# Patient Record
Sex: Male | Born: 1950 | Race: White | Hispanic: No | Marital: Married | Smoking: Never smoker
Health system: Southern US, Community
[De-identification: ages and names within clinical notes are randomized; demographics above are authoritative.]

## PROBLEM LIST (undated history)

## (undated) DIAGNOSIS — F0150 Vascular dementia without behavioral disturbance: Secondary | ICD-10-CM

## (undated) DIAGNOSIS — I4891 Unspecified atrial fibrillation: Secondary | ICD-10-CM

## (undated) DIAGNOSIS — F419 Anxiety disorder, unspecified: Secondary | ICD-10-CM

## (undated) DIAGNOSIS — G250 Essential tremor: Secondary | ICD-10-CM

## (undated) DIAGNOSIS — I10 Essential (primary) hypertension: Secondary | ICD-10-CM

## (undated) DIAGNOSIS — F32A Depression, unspecified: Secondary | ICD-10-CM

## (undated) DIAGNOSIS — K219 Gastro-esophageal reflux disease without esophagitis: Secondary | ICD-10-CM

## (undated) DIAGNOSIS — I2510 Atherosclerotic heart disease of native coronary artery without angina pectoris: Secondary | ICD-10-CM

## (undated) DIAGNOSIS — I219 Acute myocardial infarction, unspecified: Secondary | ICD-10-CM

## (undated) DIAGNOSIS — I951 Orthostatic hypotension: Secondary | ICD-10-CM

## (undated) DIAGNOSIS — E7800 Pure hypercholesterolemia, unspecified: Secondary | ICD-10-CM

## (undated) DIAGNOSIS — I639 Cerebral infarction, unspecified: Secondary | ICD-10-CM

## (undated) DIAGNOSIS — I495 Sick sinus syndrome: Secondary | ICD-10-CM

## (undated) HISTORY — PX: HERNIA REPAIR: SHX51

## (undated) HISTORY — PX: APPENDECTOMY: SHX54

## (undated) HISTORY — PX: SHOULDER SURGERY: SHX246

## (undated) HISTORY — PX: CARDIAC SURGERY: SHX584

## (undated) HISTORY — PX: NASAL SINUS SURGERY: SHX719

## (undated) HISTORY — PX: CHOLECYSTECTOMY: SHX55

---

## 2001-09-30 HISTORY — PX: ESOPHAGOGASTRODUODENOSCOPY: SHX1529

## 2011-05-03 HISTORY — PX: SHOULDER SURGERY: SHX246

## 2013-02-12 DIAGNOSIS — Z955 Presence of coronary angioplasty implant and graft: Secondary | ICD-10-CM | POA: Insufficient documentation

## 2013-02-12 DIAGNOSIS — E785 Hyperlipidemia, unspecified: Secondary | ICD-10-CM | POA: Insufficient documentation

## 2013-02-12 DIAGNOSIS — I10 Essential (primary) hypertension: Secondary | ICD-10-CM | POA: Insufficient documentation

## 2013-02-12 DIAGNOSIS — N529 Male erectile dysfunction, unspecified: Secondary | ICD-10-CM | POA: Insufficient documentation

## 2015-05-03 DIAGNOSIS — D649 Anemia, unspecified: Secondary | ICD-10-CM

## 2015-05-03 HISTORY — DX: Anemia, unspecified: D64.9

## 2015-08-01 HISTORY — PX: ESOPHAGOGASTRODUODENOSCOPY: SHX1529

## 2015-08-01 HISTORY — PX: COLONOSCOPY: SHX5424

## 2015-08-10 LAB — HM COLONOSCOPY

## 2016-12-18 DIAGNOSIS — R001 Bradycardia, unspecified: Secondary | ICD-10-CM | POA: Insufficient documentation

## 2016-12-18 DIAGNOSIS — Z9889 Other specified postprocedural states: Secondary | ICD-10-CM | POA: Insufficient documentation

## 2017-02-17 DIAGNOSIS — Z950 Presence of cardiac pacemaker: Secondary | ICD-10-CM | POA: Insufficient documentation

## 2017-05-25 DIAGNOSIS — Z950 Presence of cardiac pacemaker: Secondary | ICD-10-CM | POA: Insufficient documentation

## 2017-09-14 DIAGNOSIS — N183 Chronic kidney disease, stage 3 unspecified: Secondary | ICD-10-CM | POA: Insufficient documentation

## 2017-09-26 DIAGNOSIS — G4700 Insomnia, unspecified: Secondary | ICD-10-CM | POA: Insufficient documentation

## 2017-12-13 DIAGNOSIS — K219 Gastro-esophageal reflux disease without esophagitis: Secondary | ICD-10-CM | POA: Insufficient documentation

## 2018-06-01 DIAGNOSIS — W19XXXA Unspecified fall, initial encounter: Secondary | ICD-10-CM | POA: Insufficient documentation

## 2018-06-01 DIAGNOSIS — F32A Depression, unspecified: Secondary | ICD-10-CM | POA: Insufficient documentation

## 2018-10-01 DIAGNOSIS — I951 Orthostatic hypotension: Secondary | ICD-10-CM | POA: Insufficient documentation

## 2019-01-01 HISTORY — PX: HERNIA REPAIR: SHX51

## 2019-01-01 HISTORY — PX: CHOLECYSTECTOMY, LAPAROSCOPIC: SHX56

## 2019-03-03 HISTORY — PX: LEFT ATRIAL APPENDAGE OCCLUSION: SHX173A

## 2020-01-01 HISTORY — PX: ASD REPAIR: SHX258

## 2020-03-02 DIAGNOSIS — I69391 Dysphagia following cerebral infarction: Secondary | ICD-10-CM

## 2020-03-02 HISTORY — DX: Dysphagia following cerebral infarction: I69.391

## 2020-05-26 DIAGNOSIS — R748 Abnormal levels of other serum enzymes: Secondary | ICD-10-CM | POA: Insufficient documentation

## 2020-09-30 DIAGNOSIS — G2581 Restless legs syndrome: Secondary | ICD-10-CM | POA: Insufficient documentation

## 2020-09-30 DIAGNOSIS — D649 Anemia, unspecified: Secondary | ICD-10-CM | POA: Insufficient documentation

## 2020-11-09 ENCOUNTER — Emergency Department (HOSPITAL_BASED_OUTPATIENT_CLINIC_OR_DEPARTMENT_OTHER)
Admission: EM | Admit: 2020-11-09 | Discharge: 2020-11-09 | Disposition: A | Discharge status: Home / Self Care | Payer: Medicare Other | Attending: Emergency Medicine | Admitting: Emergency Medicine

## 2020-11-09 ENCOUNTER — Emergency Department (HOSPITAL_BASED_OUTPATIENT_CLINIC_OR_DEPARTMENT_OTHER): Payer: Medicare Other

## 2020-11-09 ENCOUNTER — Encounter (HOSPITAL_BASED_OUTPATIENT_CLINIC_OR_DEPARTMENT_OTHER): Payer: Self-pay

## 2020-11-09 ENCOUNTER — Other Ambulatory Visit: Payer: Self-pay

## 2020-11-09 DIAGNOSIS — W19XXXA Unspecified fall, initial encounter: Secondary | ICD-10-CM

## 2020-11-09 DIAGNOSIS — S0083XA Contusion of other part of head, initial encounter: Secondary | ICD-10-CM | POA: Insufficient documentation

## 2020-11-09 DIAGNOSIS — I2510 Atherosclerotic heart disease of native coronary artery without angina pectoris: Secondary | ICD-10-CM | POA: Diagnosis not present

## 2020-11-09 DIAGNOSIS — F0390 Unspecified dementia without behavioral disturbance: Secondary | ICD-10-CM | POA: Insufficient documentation

## 2020-11-09 DIAGNOSIS — S0990XA Unspecified injury of head, initial encounter: Secondary | ICD-10-CM

## 2020-11-09 DIAGNOSIS — W010XXA Fall on same level from slipping, tripping and stumbling without subsequent striking against object, initial encounter: Secondary | ICD-10-CM | POA: Diagnosis not present

## 2020-11-09 DIAGNOSIS — T148XXA Other injury of unspecified body region, initial encounter: Secondary | ICD-10-CM

## 2020-11-09 DIAGNOSIS — I119 Hypertensive heart disease without heart failure: Secondary | ICD-10-CM | POA: Diagnosis not present

## 2020-11-09 HISTORY — DX: Vascular dementia, unspecified severity, without behavioral disturbance, psychotic disturbance, mood disturbance, and anxiety: F01.50

## 2020-11-09 HISTORY — DX: Anxiety disorder, unspecified: F41.9

## 2020-11-09 HISTORY — DX: Acute myocardial infarction, unspecified: I21.9

## 2020-11-09 HISTORY — DX: Atherosclerotic heart disease of native coronary artery without angina pectoris: I25.10

## 2020-11-09 HISTORY — DX: Orthostatic hypotension: I95.1

## 2020-11-09 HISTORY — DX: Gastro-esophageal reflux disease without esophagitis: K21.9

## 2020-11-09 HISTORY — DX: Essential (primary) hypertension: I10

## 2020-11-09 HISTORY — DX: Cerebral infarction, unspecified: I63.9

## 2020-11-09 HISTORY — DX: Pure hypercholesterolemia, unspecified: E78.00

## 2020-11-09 HISTORY — DX: Depression, unspecified: F32.A

## 2020-11-09 HISTORY — DX: Essential tremor: G25.0

## 2020-11-09 IMAGING — CT CT HEAD W/O CM
4 series · 15 of 47 positions shown, 17 images · non-contrast
Comparison: None.

CLINICAL DATA: Fall

EXAM:
CT HEAD WITHOUT CONTRAST
TECHNIQUE: Contiguous axial images were obtained from the base of the skull
through the vertex without intravenous contrast.

[Series 2: head wo · axial · 0.51mm/px · z∈[+1090,+1230]mm · 7 of 38 slices shown, 9 images]
[im 5/38  brain]
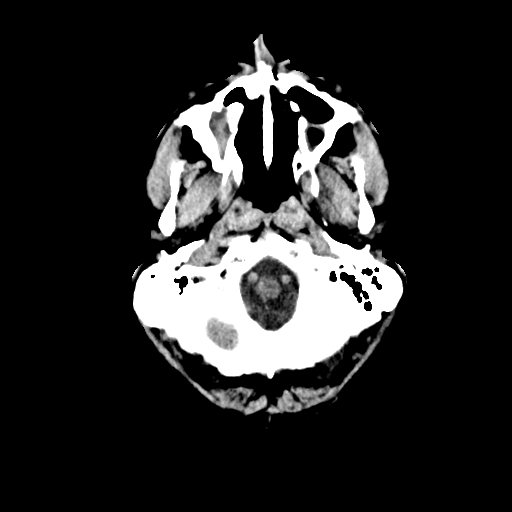
[im 5/38  bone]
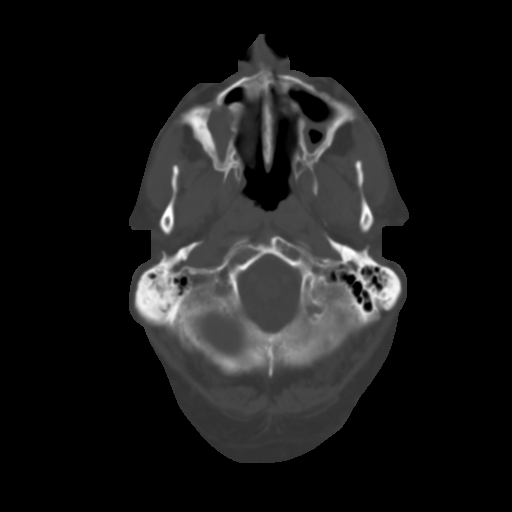
[im 10/38  brain]
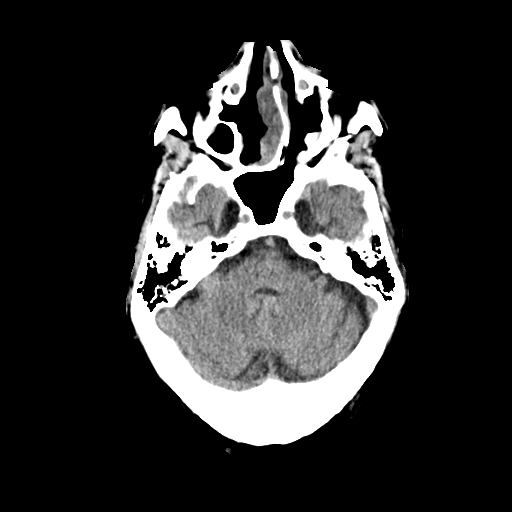
[im 14/38  brain]
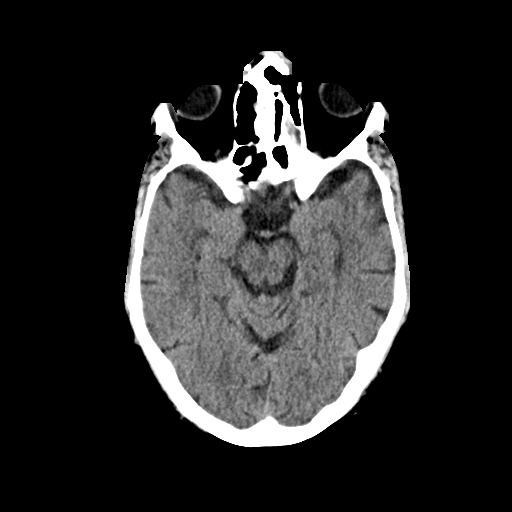
[im 19/38  brain]
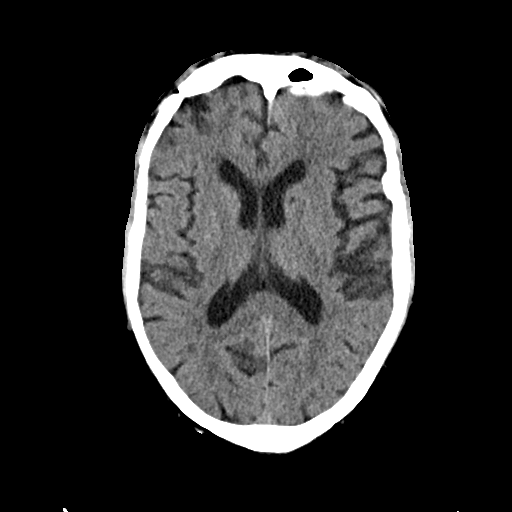
[im 24/38  brain]
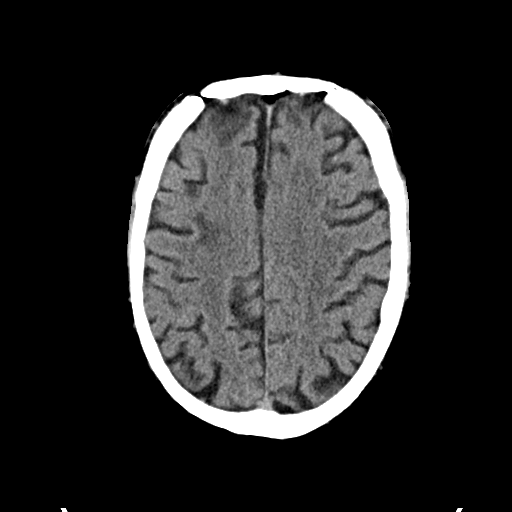
[im 24/38  bone]
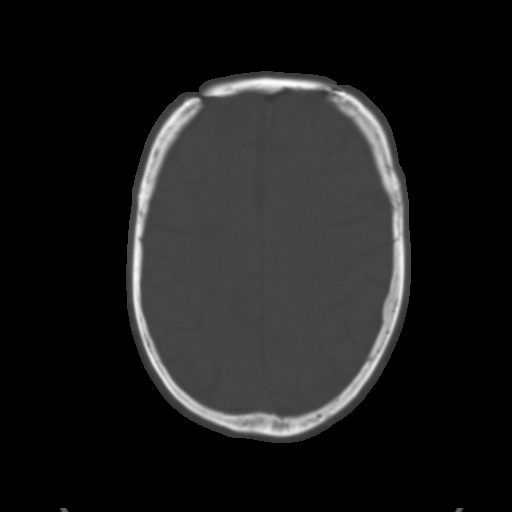
[im 28/38  brain]
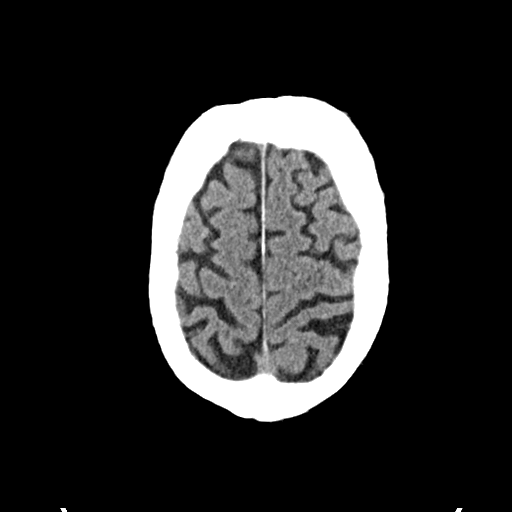
[im 33/38  brain]
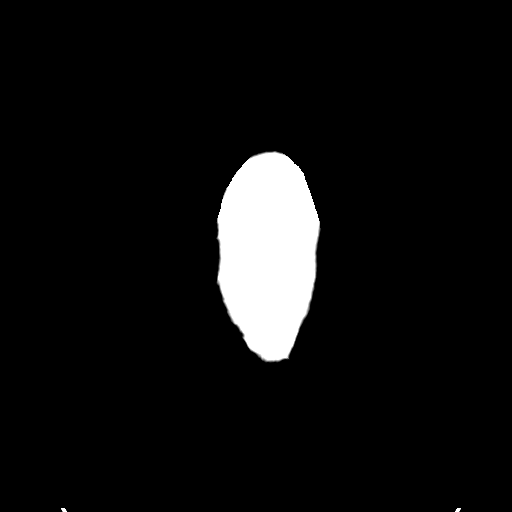

[Series 3: head bone · axial · 0.51mm/px · z∈[+1088,+1106]mm · 2 of 93 slices shown]
[im 10/93  bone]
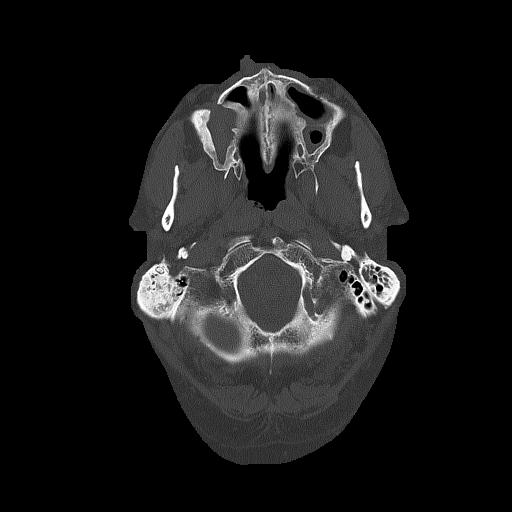
[im 19/93  bone]
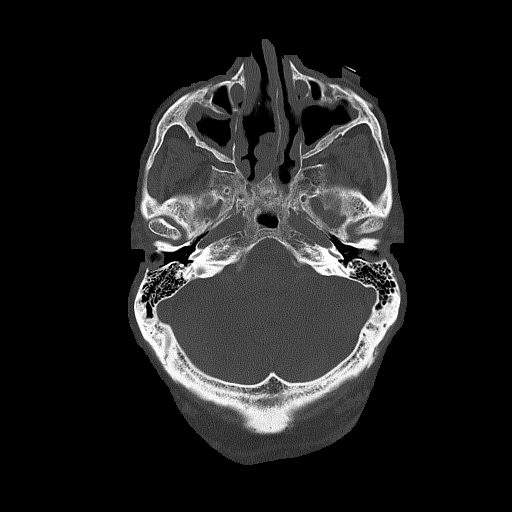

[Series 4: coronal soft · coronal · 0.36mm/px · 3 of 81 slices shown]
[im 27/81  brain]
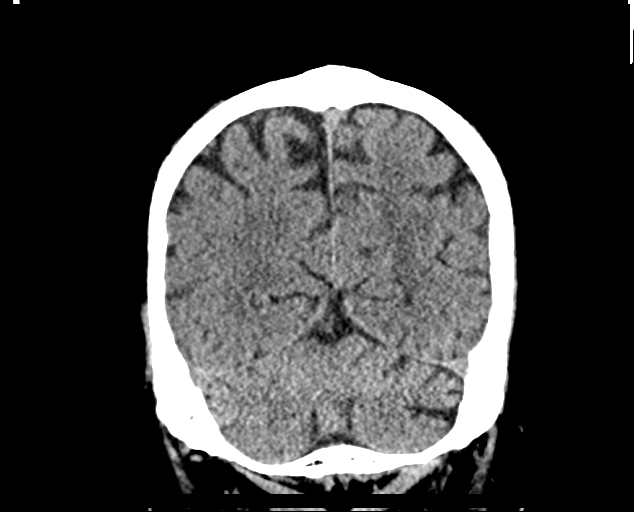
[im 36/81  brain]
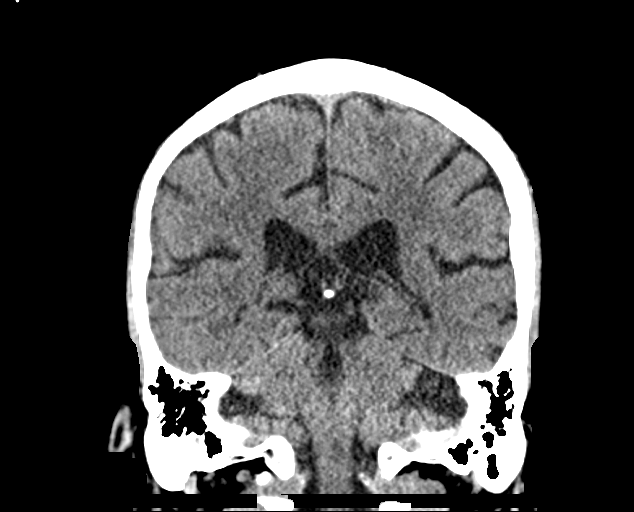
[im 45/81  brain]
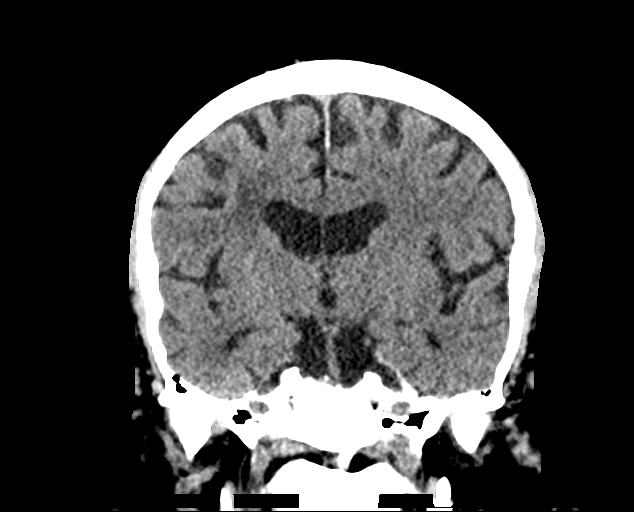

[Series 5: sag soft · sagittal · 0.36mm/px · 3 of 67 slices shown]
[im 23/67  brain]
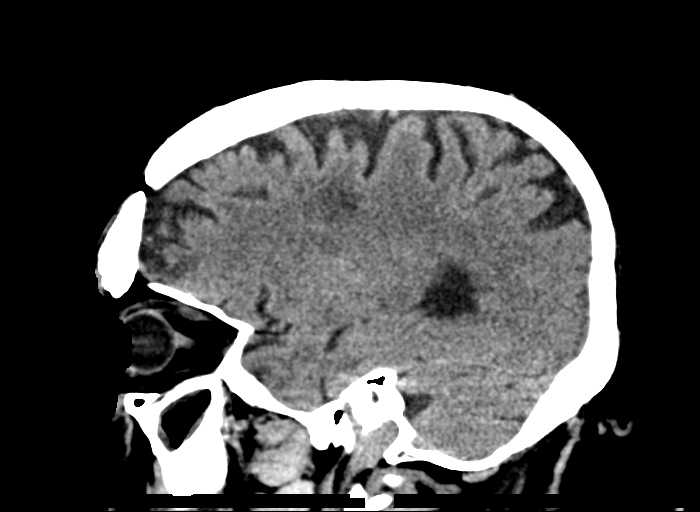
[im 34/67  brain]
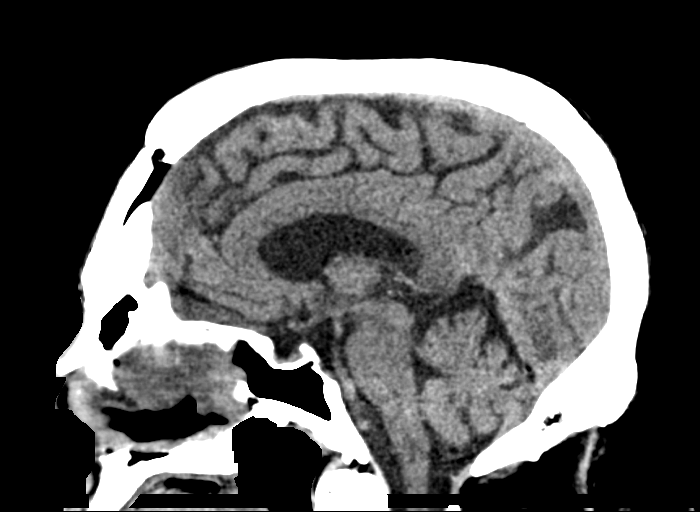
[im 45/67  brain]
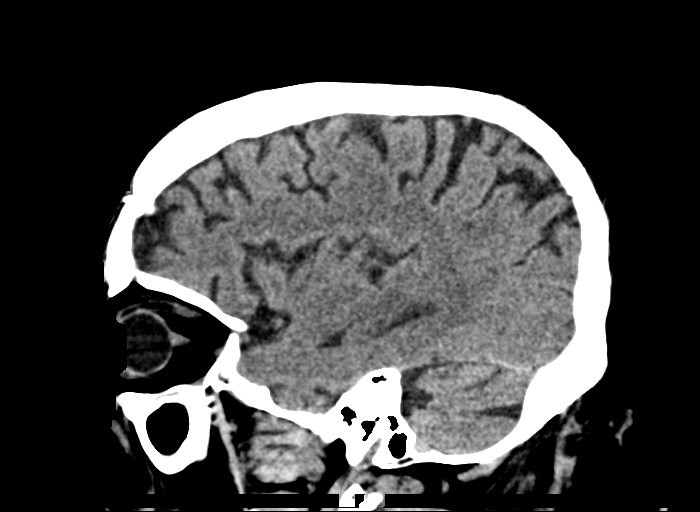

[15 of 47 positions shown; findings below may reference images not displayed]

FINDINGS: Brain: Evidence of prior bifrontal craniotomy with areas of
encephalomalacia in the anterior frontal lobes. Additional focal
region of subcortical white matter hypoattenuation seen in the right
corona radiata, possibly remote sequela of trauma, ischemia or
microvascular angiopathy. More diffuse patchy areas of white matter
hypoattenuation elsewhere likely reflecting chronic microvascular
ischemic changes as well. Few remote appearing lacunar infarcts in
the left caudate and bilateral basal ganglia. No convincing CT
evidence of acute infarction, hemorrhage, hydrocephalus, extra-axial
collection, visible mass lesion or mass effect. Symmetric prominence
of the ventricles, cisterns and sulci compatible with parenchymal
volume loss.

Vascular: Atherosclerotic calcification of the carotid siphons and
intradural vertebral arteries. No hyperdense vessel.

Skull: Postsurgical changes from prior bifrontal craniotomy. No
acute complication is seen. No acute fracture, scalp swelling or
hematoma.

Sinuses/Orbits: Evidence of prior sinus surgery including
ethmoidectomies, turbinectomies and anterior right maxillary
fenestration. Diffuse nodular mural thickening throughout the
paranasal sinuses with hyperostotic features suggesting chronicity.
Small bilateral mastoid effusions. Middle ear cavities are clear.
Included orbital structures are unremarkable.

Other: None.
IMPRESSION: 1. No convincing CT evidence of an acute intracranial process. No
scalp swelling or calvarial fracture.
2. Evidence of prior bifrontal craniotomy with subjacent areas of
encephalomalacia.
3. Background of diffuse chronic microvascular
angiopathy/microvascular ischemic change. Suspect more focal regions
in the right corona radiata with remote appearing lacunar type
infarcts in the bilateral basal ganglia and left caudate.
4. Intracranial atherosclerosis.
5. Mild parenchymal volume loss.
6. Prior sinus surgery with likely chronic persistent mural sinus
disease. Bilateral mastoid effusions as well.

## 2020-11-09 MED ORDER — ACETAMINOPHEN 500 MG PO TABS
1000.0000 mg | ORAL_TABLET | Freq: Once | ORAL | Status: AC
  Administered 2020-11-09: 1000 mg via ORAL
  Filled 2020-11-09: qty 2

## 2020-11-09 NOTE — ED Triage Notes (Signed)
Pt tripped while using a walker and hit his head. Unknown LOC. Pt does take ASA and Plavix. Pt A/Ox4 and move all 4 extremities during triage.

## 2020-11-10 NOTE — ED Provider Notes (Signed)
[place] EMERGENCY DEPARTMENT Provider Note   CSN: [id number] Arrival date & time: 11/09/20  2153     History Chief Complaint  Patient presents with   Mark Thomas is a 70 y.o. male.   Fall This is a new problem. The current episode started less than 1 hour ago. The problem occurs constantly. The problem has not changed since onset.Associated symptoms include headaches. Pertinent negatives include no chest pain, no abdominal pain and no shortness of breath. Nothing aggravates the symptoms. Nothing relieves the symptoms. He has tried nothing for the symptoms.      Past Medical History:  Diagnosis Date   Anxiety    Coronary artery disease    Depression    Essential tremor    GERD (gastroesophageal reflux disease)    High cholesterol    Hypertension    Myocardial infarct (HCC)    Orthostatic hypotension    Stroke [facility])    Vascular dementia ([place])     There are no problems to display for this patient.   Past Surgical History:  Procedure Laterality Date   APPENDECTOMY     CARDIAC SURGERY     CHOLECYSTECTOMY     HERNIA REPAIR     NASAL SINUS SURGERY     SHOULDER SURGERY         No family history on file.  Social History   Tobacco Use   Smoking status: Never    Passive exposure: Never   Smokeless tobacco: Never  Vaping Use   Vaping Use: Never used  Substance Use Topics   Alcohol use: Not Currently   Drug use: Never    Home Medications Prior to Admission medications   Not on File    Allergies    Dilaudid [hydromorphone], Codeine, Penicillins, and Phenobarbital  Review of Systems   Review of Systems  Respiratory:  Negative for shortness of breath.   Cardiovascular:  Negative for chest pain.  Gastrointestinal:  Negative for abdominal pain.  Neurological:  Positive for headaches.  All other systems reviewed and are negative.  Physical Exam Updated Vital Signs BP 131/83   Pulse 76   Temp 98.8 F (37.1 C) (Oral)   Resp  17   Ht _0  (1.854 m)   Wt 104.3 kg   SpO2 97%   BMI 30.34 kg/m   Physical Exam Vitals and nursing note reviewed.  Constitutional:      Appearance: He is well-developed.  HENT:     Head: Normocephalic.     Comments: Abrasion and hematoma to left frontal bone area    Nose: No congestion or rhinorrhea.  Eyes:     Pupils: Pupils are equal, round, and reactive to light.  Cardiovascular:     Rate and Rhythm: Normal rate.  Pulmonary:     Effort: Pulmonary effort is normal. No respiratory distress.  Abdominal:     General: Abdomen is flat. There is no distension.  Musculoskeletal:        General: Normal range of motion.     Cervical back: Normal range of motion.  Skin:    General: Skin is warm and dry.  Neurological:     General: No focal deficit present.     Mental Status: He is alert.    ED Results / Procedures / Treatments   Labs (all labs ordered are listed, but only abnormal results are displayed) Labs Reviewed - No data to display  EKG None  Radiology CT Head Wo Contrast  Result Date: 11/09/2020 CLINICAL DATA:  Fall EXAM: CT HEAD WITHOUT CONTRAST TECHNIQUE: Contiguous axial images were obtained from the base of the skull through the vertex without intravenous contrast. COMPARISON:  None. FINDINGS: Brain: Evidence of prior bifrontal craniotomy with areas of encephalomalacia in the anterior frontal lobes. Additional focal region of subcortical white matter hypoattenuation seen in the right corona radiata, possibly remote sequela of trauma, ischemia or microvascular angiopathy. More diffuse patchy areas of white matter hypoattenuation elsewhere likely reflecting chronic microvascular ischemic changes as well. Few remote appearing lacunar infarcts in the left caudate and bilateral basal ganglia. No convincing CT evidence of acute infarction, hemorrhage, hydrocephalus, extra-axial collection, visible mass lesion or mass effect. Symmetric prominence of the ventricles, cisterns  and sulci compatible with parenchymal volume loss. Vascular: Atherosclerotic calcification of the carotid siphons and intradural vertebral arteries. No hyperdense vessel. Skull: Postsurgical changes from prior bifrontal craniotomy. No acute complication is seen. No acute fracture, scalp swelling or hematoma. Sinuses/Orbits: Evidence of prior sinus surgery including ethmoidectomies, turbinectomies and anterior right maxillary fenestration. Diffuse nodular mural thickening throughout the paranasal sinuses with hyperostotic features suggesting chronicity. Small bilateral mastoid effusions. Middle ear cavities are clear. Included orbital structures are unremarkable. Other: None. IMPRESSION: 1. No convincing CT evidence of an acute intracranial process. No scalp swelling or calvarial fracture. 2. Evidence of prior bifrontal craniotomy with subjacent areas of encephalomalacia. 3. Background of diffuse chronic microvascular angiopathy/microvascular ischemic change. Suspect more focal regions in the right corona radiata with remote appearing lacunar type infarcts in the bilateral basal ganglia and left caudate. 4. Intracranial atherosclerosis. 5. Mild parenchymal volume loss. 6. 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 Bilateral mastoid effusions as well. Electronically Signed   By: Lovena Le M.D.   On: 11/09/2020 23:04    Procedures Procedures   Medications Ordered in ED Medications  acetaminophen (TYLENOL) tablet 1,000 mg (1,000 mg Oral Given 11/09/20 2327)    ED Course  I have reviewed the triage vital signs and the nursing notes.  Pertinent labs & imaging results that were available during my care of the patient were reviewed by me and considered in my medical decision making (see chart for details).    MDM Rules/Calculators/A&P                          Patient with multi medical problems documented above including multiple strokes and vascular dementia presents  emerged from today secondary to what sounds like an obvious mechanical fall.  Patient states he is walking his walker it hit a piece of furniture and he fell down and hit his head on the ground.  He has a headache since that time.  He states that he had to drag his self across the carpet and has little bit of rash/abrasion on his forehead from the same.  His wife helped him get up into a chair and was able to do that without difficulty.  He was able to ambulate to a vehicle with his walker without difficulty.  Low suspicion for any type of hip or long bone fracture.  No evidence of abnormalities on CT scan.  Patient stable for discharge at this time.  Final Clinical Impression(s) / ED Diagnoses Final diagnoses:  Fall, initial encounter  Abrasion  Injury of head, initial encounter    Rx / DC Orders ED Discharge Orders     None        Noralee Dutko, Corene Cornea, MD 11/10/20  [place]

## 2020-11-30 ENCOUNTER — Telehealth (HOSPITAL_COMMUNITY): Payer: Self-pay | Admitting: Internal Medicine

## 2020-11-30 DIAGNOSIS — R269 Unspecified abnormalities of gait and mobility: Secondary | ICD-10-CM

## 2020-11-30 HISTORY — DX: Unspecified abnormalities of gait and mobility: R26.9

## 2020-11-30 NOTE — Telephone Encounter (Signed)
Pt called to f/u w/referral from  Dr Manuella Ghazi, [facility] , please advise

## 2020-12-06 ENCOUNTER — Emergency Department (HOSPITAL_BASED_OUTPATIENT_CLINIC_OR_DEPARTMENT_OTHER): Payer: Medicare Other

## 2020-12-06 ENCOUNTER — Other Ambulatory Visit: Payer: Self-pay

## 2020-12-06 ENCOUNTER — Emergency Department (HOSPITAL_BASED_OUTPATIENT_CLINIC_OR_DEPARTMENT_OTHER)
Admission: EM | Admit: 2020-12-06 | Discharge: 2020-12-07 | Discharge status: Against Medical Advice | Payer: Medicare Other | Attending: Emergency Medicine | Admitting: Emergency Medicine

## 2020-12-06 ENCOUNTER — Encounter (HOSPITAL_BASED_OUTPATIENT_CLINIC_OR_DEPARTMENT_OTHER): Payer: Self-pay

## 2020-12-06 DIAGNOSIS — R011 Cardiac murmur, unspecified: Secondary | ICD-10-CM | POA: Insufficient documentation

## 2020-12-06 DIAGNOSIS — Z8673 Personal history of transient ischemic attack (TIA), and cerebral infarction without residual deficits: Secondary | ICD-10-CM | POA: Insufficient documentation

## 2020-12-06 DIAGNOSIS — I10 Essential (primary) hypertension: Secondary | ICD-10-CM | POA: Insufficient documentation

## 2020-12-06 DIAGNOSIS — R0602 Shortness of breath: Secondary | ICD-10-CM | POA: Insufficient documentation

## 2020-12-06 DIAGNOSIS — I2510 Atherosclerotic heart disease of native coronary artery without angina pectoris: Secondary | ICD-10-CM | POA: Insufficient documentation

## 2020-12-06 DIAGNOSIS — F0150 Vascular dementia without behavioral disturbance: Secondary | ICD-10-CM | POA: Diagnosis not present

## 2020-12-06 DIAGNOSIS — R079 Chest pain, unspecified: Secondary | ICD-10-CM

## 2020-12-06 DIAGNOSIS — R0789 Other chest pain: Secondary | ICD-10-CM | POA: Insufficient documentation

## 2020-12-06 DIAGNOSIS — Z20822 Contact with and (suspected) exposure to covid-19: Secondary | ICD-10-CM | POA: Insufficient documentation

## 2020-12-06 DIAGNOSIS — R0601 Orthopnea: Secondary | ICD-10-CM | POA: Diagnosis not present

## 2020-12-06 LAB — CBC
HCT: 38.6 % — ABNORMAL LOW (ref 39.0–52.0)
Hemoglobin: 12.6 g/dL — ABNORMAL LOW (ref 13.0–17.0)
MCH: 31 pg (ref 26.0–34.0)
MCHC: 32.6 g/dL (ref 30.0–36.0)
MCV: 95.1 fL (ref 80.0–100.0)
Platelets: 194 10*3/uL (ref 150–400)
RBC: 4.06 MIL/uL — ABNORMAL LOW (ref 4.22–5.81)
RDW: 16.9 % — ABNORMAL HIGH (ref 11.5–15.5)
WBC: 7.4 10*3/uL (ref 4.0–10.5)
nRBC: 0 % (ref 0.0–0.2)

## 2020-12-06 LAB — BASIC METABOLIC PANEL
Anion gap: 10 (ref 5–15)
BUN: 28 mg/dL — ABNORMAL HIGH (ref 8–23)
CO2: 25 mmol/L (ref 22–32)
Calcium: 8.7 mg/dL — ABNORMAL LOW (ref 8.9–10.3)
Chloride: 100 mmol/L (ref 98–111)
Creatinine, Ser: 1.66 mg/dL — ABNORMAL HIGH (ref 0.61–1.24)
GFR, Estimated: 45 mL/min — ABNORMAL LOW (ref >60)
Glucose, Bld: 97 mg/dL (ref 70–99)
Potassium: 4.5 mmol/L (ref 3.5–5.1)
Sodium: 135 mmol/L (ref 135–145)

## 2020-12-06 LAB — TROPONIN I (HIGH SENSITIVITY): Troponin I (High Sensitivity): 10 ng/L (ref <18)

## 2020-12-06 IMAGING — DX DG CHEST 1V PORT
1 series · 1 of 1 positions shown · non-contrast
Comparison: None.

CLINICAL DATA: Shortness of breath and chest pain

EXAM:
PORTABLE CHEST 1 VIEW

[chest]
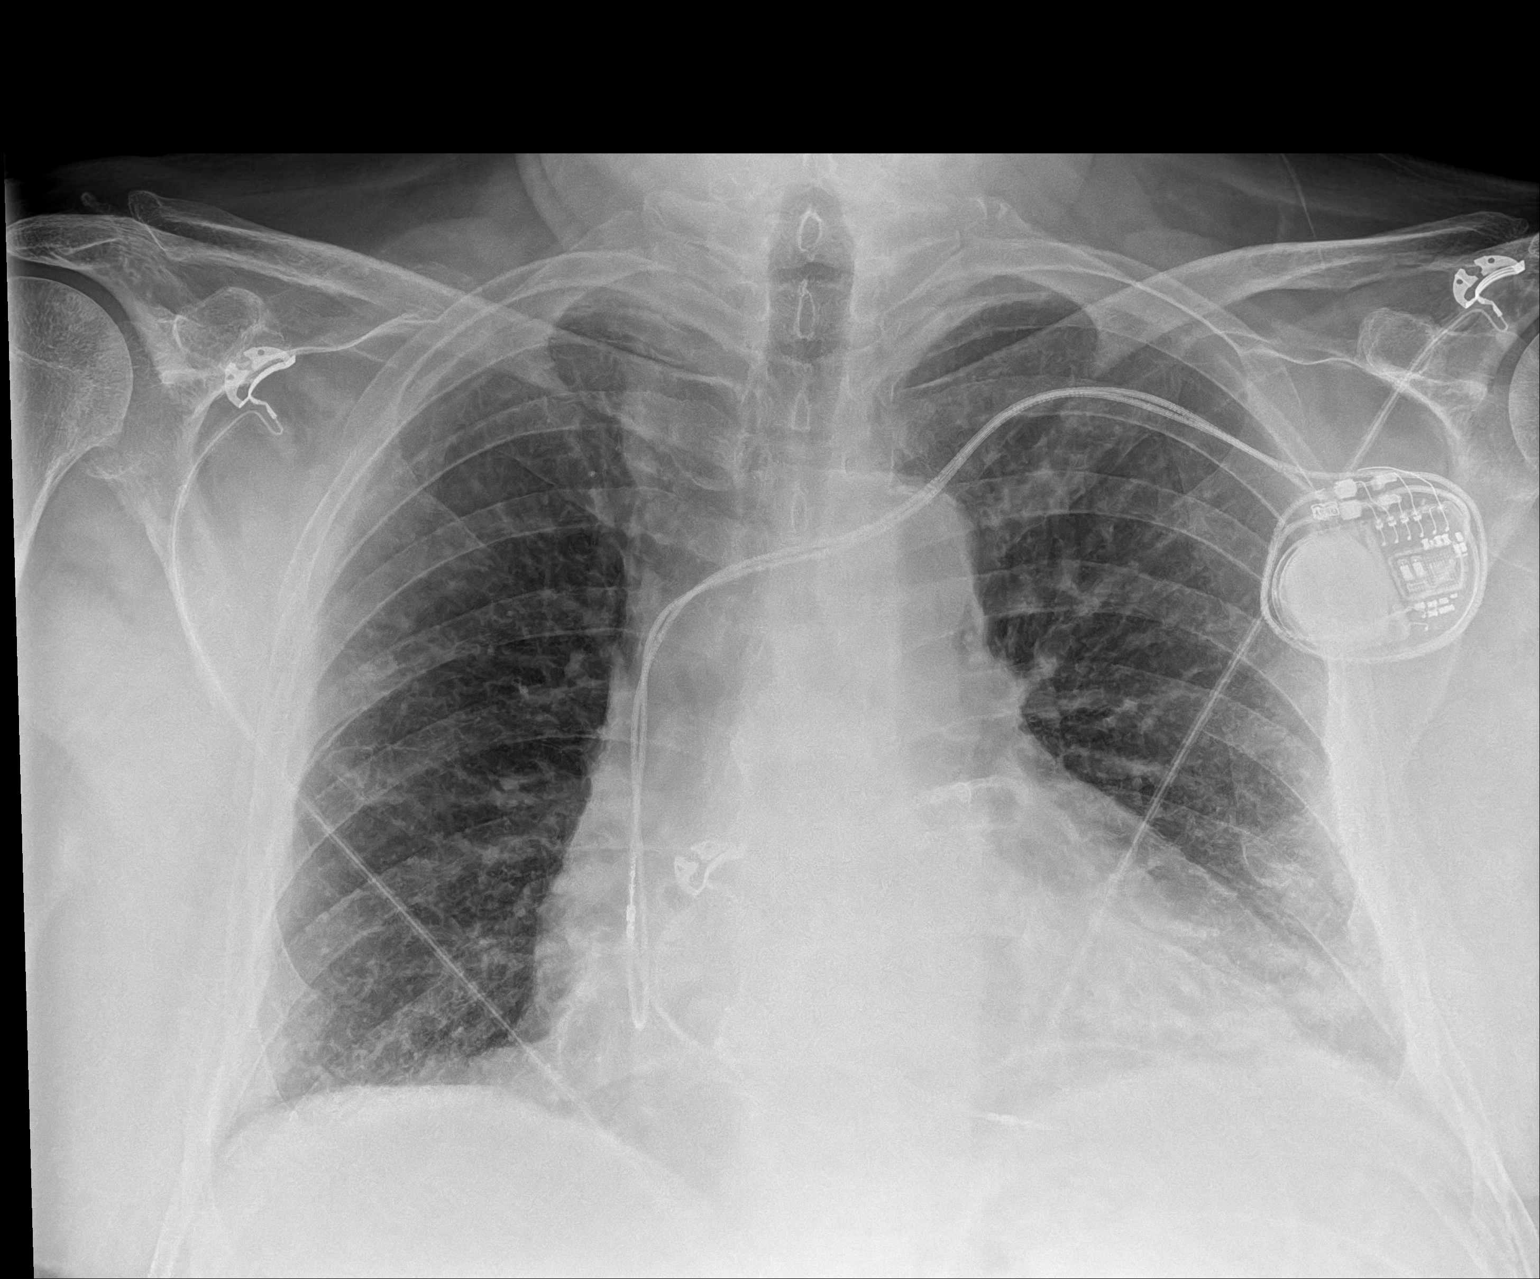

[1 of 1 positions shown; findings below may reference images not displayed]

FINDINGS: Left-sided pacing device with leads over right atrium and right
ventricle. Cardiomegaly. Patchy atelectasis versus minimal
infiltrate left base. No pneumothorax
IMPRESSION: 1. Cardiomegaly.
2. Patchy atelectasis versus minimal infiltrate left lung base

## 2020-12-06 MED ORDER — HYDROCODONE-ACETAMINOPHEN 5-325 MG PO TABS
1.0000 | ORAL_TABLET | Freq: Once | ORAL | Status: AC
  Administered 2020-12-06: 1 via ORAL
  Filled 2020-12-06: qty 1

## 2020-12-06 NOTE — ED Triage Notes (Addendum)
Pt is present for central chest pain that radiates to the right shoulder with SOB that started at 1600 this afternoon. Pt states "it feels like someone is sitting on my chest". Pt states he tried to lie flat but had difficulty breathing. Denies N/V. Pt has not taken anything for the pain. Pt has a pacemaker with hx of MI.

## 2020-12-07 LAB — BRAIN NATRIURETIC PEPTIDE: B Natriuretic Peptide: 64.4 pg/mL (ref 0.0–100.0)

## 2020-12-07 LAB — TROPONIN I (HIGH SENSITIVITY): Troponin I (High Sensitivity): 11 ng/L (ref <18)

## 2020-12-07 LAB — RESP PANEL BY RT-PCR (FLU A&B, COVID) ARPGX2
Influenza A by PCR: NEGATIVE
Influenza B by PCR: NEGATIVE
SARS Coronavirus 2 by RT PCR: NEGATIVE

## 2020-12-07 MED ORDER — NITROGLYCERIN 2 % TD OINT
1.0000 [in_us] | TOPICAL_OINTMENT | Freq: Once | TRANSDERMAL | Status: DC
  Filled 2020-12-07: qty 1

## 2020-12-07 NOTE — ED Provider Notes (Signed)
[place] EMERGENCY DEPT Provider Note   CSN: [id number] Arrival date & time: 12/06/20  2108     History Chief Complaint  Patient presents with   Chest Pain   Shortness of Breath    Mark Thomas is a 70 y.o. male.  HPI     This is a 70 year old male with a history of coronary artery disease, hypertension, hypercholesterolemia, stroke, vascular dementia who presents with chest discomfort.  Patient reports that he has been having progressive shortness of breath over the last several days.  Around 5 PM he began to have left-sided chest pain.  He describes it as heaviness and pressure.  He also reports shoulder pain.  However, he attributes this to his torn rotator cuff.  Patient rates his pain 8 out of 10.  Over the last several days.  He describes dyspnea on exertion and orthopnea.  Wife has noted this as well.  No recent fevers or cough.  Wife reports that he has intermittently been on Lasix in the past but is not currently on Lasix.  Patient recently moved from [place].  He does not have a local cardiologist but has been referred to Dr. Haroldine Laws.  Reports stroke as recent as January of this year.  Also reports he has had several cardiac stents placed in the past.  He is on aspirin and Plavix.  He also has a Multimedia programmer and a pacemaker.  Echocardiogram (05/04/2020): LVEF 55-60%.  Summary 1. The left ventricular systolic function is normal, LVEF is visually estimated at 55-60%. 2. The left ventricle is normal in size with mildly increased wall thickness. 3. There is grade II diastolic dysfunction (elevated filling pressure). 4. The right ventricle is normal in size, with normal systolic function. 5. There is mild to moderate aortic valve stenosis; the Vmax is 2.95 m/s, the mean gradient is 20 mmHg, the max gradient is 35 mmHg, the calculated AVA is 1.2 cm2 and the dimensionless index is 0.37. 6. There is mild aortic regurgitation which is eccentric in nature  and directed against the septum. 7. The mitral valve leaflets are normal with normal leaflet mobility. 8. There is moderate tricuspid regurgitation. 9. RVSP is estimated at 45-50 mmHg. 10. IVC size and inspiratory change suggest normal right atrial pressure. (0-5 mmHg). 11. There is no pericardial effusion. 12. Patient with history of recent interatrial septum closure; on current study the atrial septum is not well visualized or interrogated. 13. No bubble study was performed to assess the closure device.    NM Myocardial Perfusion Spect Multiple (07/17/2019) IMPRESSIONS & RECOMMENDATIONS SPECT images demonstrate mild-moderate inferior wall ischemia on pharmacological stress (medium in size, mild in severity) No scintigraphic evidence of scar. Normal left ventricular ejection fraction at 55%. Persistent rate controlled AF Past Medical History:  Diagnosis Date   Anxiety    Coronary artery disease    Depression    Essential tremor    GERD (gastroesophageal reflux disease)    High cholesterol    Hypertension    Myocardial infarct (HCC)    Orthostatic hypotension    Stroke [facility])    Vascular dementia ([place])     There are no problems to display for this patient.   Past Surgical History:  Procedure Laterality Date   APPENDECTOMY     CARDIAC SURGERY     CHOLECYSTECTOMY     HERNIA REPAIR     NASAL SINUS SURGERY     SHOULDER SURGERY         No family  history on file.  Social History   Tobacco Use   Smoking status: Never    Passive exposure: Never   Smokeless tobacco: Never  Vaping Use   Vaping Use: Never used  Substance Use Topics   Alcohol use: Not Currently   Drug use: Never    Home Medications Prior to Admission medications   Not on File    Allergies    Dilaudid [hydromorphone], Codeine, Penicillins, and Phenobarbital  Review of Systems   Review of Systems  Constitutional:  Negative for fever.  Respiratory:  Positive for shortness of breath. Negative  for cough.   Cardiovascular:  Positive for chest pain. Negative for leg swelling.  Gastrointestinal:  Negative for abdominal pain and nausea.  Genitourinary:  Negative for dysuria.  All other systems reviewed and are negative.  Physical Exam Updated Vital Signs BP (!) 148/88   Pulse 75   Temp 98.4 F (36.9 C)   Resp 13   Ht 1.854 m (_0 )   Wt 106.6 kg   SpO2 94%   BMI 31.00 kg/m   Physical Exam Vitals and nursing note reviewed.  Constitutional:      Appearance: He is well-developed. He is obese. He is not ill-appearing.  HENT:     Head: Normocephalic and atraumatic.  Eyes:     Pupils: Pupils are equal, round, and reactive to light.  Neck:     Vascular: No JVD.  Cardiovascular:     Rate and Rhythm: Normal rate and regular rhythm.     Heart sounds: Murmur heard.  Systolic murmur is present.  Pulmonary:     Effort: Pulmonary effort is normal. No respiratory distress.     Breath sounds: Normal breath sounds. No wheezing or rhonchi.  Abdominal:     General: Bowel sounds are normal.     Palpations: Abdomen is soft.     Tenderness: There is no abdominal tenderness. There is no rebound.  Musculoskeletal:     Cervical back: Neck supple.     Right lower leg: No edema.     Left lower leg: No edema.  Lymphadenopathy:     Cervical: No cervical adenopathy.  Skin:    General: Skin is warm and dry.  Neurological:     Mental Status: He is alert and oriented to person, place, and time.  Psychiatric:        Mood and Affect: Mood normal.    ED Results / Procedures / Treatments   Labs (all labs ordered are listed, but only abnormal results are displayed) Labs Reviewed  BASIC METABOLIC PANEL - Abnormal; Notable for the following components:      Result Value   BUN 28 (*)    Creatinine, Ser 1.66 (*)    Calcium 8.7 (*)    GFR, Estimated 45 (*)    All other components within normal limits  CBC - Abnormal; Notable for the following components:   RBC 4.06 (*)    Hemoglobin  12.6 (*)    HCT 38.6 (*)    RDW 16.9 (*)    All other components within normal limits  RESP PANEL BY RT-PCR (FLU A&B, COVID) [id number]  BRAIN NATRIURETIC PEPTIDE  TROPONIN I (HIGH SENSITIVITY)  TROPONIN I (HIGH SENSITIVITY)    EKG EKG Interpretation  Date/Time:  Sunday December 06 2020 21:20:30 EDT Ventricular Rate:  78 PR Interval:  230 QRS Duration: 86 QT Interval:  416 QTC Calculation: 474 R Axis:   16 Text Interpretation: Atrial-paced rhythm with prolonged AV  conduction with occasional Premature ventricular complexes nonspecific T changes No old tracing to compare Confirmed by Sherwood Gambler [phone]) on 12/06/2020 9:30:24 PM  Radiology DG Chest Portable 1 View  Result Date: 12/06/2020 CLINICAL DATA:  Shortness of breath and chest pain EXAM: PORTABLE CHEST 1 VIEW COMPARISON:  None. FINDINGS: Left-sided pacing device with leads over right atrium and right ventricle. Cardiomegaly. Patchy atelectasis versus minimal infiltrate left base. No pneumothorax IMPRESSION: 1. Cardiomegaly. 2. Patchy atelectasis versus minimal infiltrate left lung base Electronically Signed   By: Donavan Foil M.D.   On: 12/06/2020 22:37    Procedures Procedures   Medications Ordered in ED Medications  nitroGLYCERIN (NITROGLYN) 2 % ointment 1 inch (has no administration in time range)  HYDROcodone-acetaminophen (NORCO/VICODIN) 5-325 MG per tablet 1 tablet (1 tablet Oral Given 12/06/20 2348)    ED Course  I have reviewed the triage vital signs and the nursing notes.  Pertinent labs & imaging results that were available during my care of the patient were reviewed by me and considered in my medical decision making (see chart for details).    MDM Rules/Calculators/A&P                           Patient presents with chest pain and shoulder pain.  He is overall nontoxic-appearing.  He has a significant cardiovascular and coronary artery history.  Chest pain is concerning especially given exertional dyspnea.  EKG  shows no evidence of acute ischemia although no prior is available for comparison.  I did review his outside chart.  Most recent EF 55 to 60%.  Notable history for aortic valve stenosis.  He does not appear overtly volume overloaded although some of his history is suggestive of CHF.  Chest x-ray shows no evidence of overt pulmonary edema, pneumonia, pneumothorax.  Troponin x2 is reassuring although his risk factors to make them to her high risk for discharge.  COVID testing is negative.  BNP is not significantly elevated.  0045 Informed by nursing that patient and his wife on leave [place].  Was assessing another patient.  By the time I went back to the patient's room, they had left.  I did on my initial evaluation indicate that he would likely need hospitalization given his symptoms and risk factors.  Unfortunately I was unable to further discuss with him my recommendation for admission prior to them leaving.  Wife and husband did indicate that they have been referred to Dr. Sung Amabile.   Final Clinical Impression(s) / ED Diagnoses Final diagnoses:  Chest pain, unspecified type  Orthopnea    Rx / DC Orders ED Discharge Orders     None        Allysson Rinehimer, Barbette Hair, MD 12/07/20 0104

## 2020-12-15 ENCOUNTER — Telehealth: Payer: Self-pay

## 2020-12-15 NOTE — Telephone Encounter (Signed)
Referral notes received from [facility], Phone #: [phone], Fax #: [phone]   A copy of the notes have been placed in the scheduling box for check-out to pick-up and to enter referral. Original notes placed in file cabinet.

## 2020-12-16 ENCOUNTER — Ambulatory Visit: Payer: Self-pay

## 2020-12-16 ENCOUNTER — Ambulatory Visit (INDEPENDENT_AMBULATORY_CARE_PROVIDER_SITE_OTHER): Payer: Medicare Other | Admitting: Orthopedic Surgery

## 2020-12-16 ENCOUNTER — Other Ambulatory Visit: Payer: Self-pay

## 2020-12-16 DIAGNOSIS — M25511 Pain in right shoulder: Secondary | ICD-10-CM | POA: Diagnosis not present

## 2020-12-16 MED ORDER — DIAZEPAM 5 MG PO TABS: ORAL_TABLET | ORAL | 0 refills | Status: DC

## 2020-12-16 NOTE — Progress Notes (Signed)
l

## 2020-12-21 ENCOUNTER — Encounter: Payer: Self-pay | Admitting: Orthopedic Surgery

## 2020-12-22 ENCOUNTER — Telehealth: Payer: Self-pay | Admitting: Orthopedic Surgery

## 2020-12-22 NOTE — Telephone Encounter (Signed)
Pt called requesting a disc copy of xray from Dr. Marlou Sa. Pt states he will pick up on Friday. Pt phone number is [phone].

## 2020-12-22 NOTE — Telephone Encounter (Signed)
CD at front desk ready for pick up

## 2020-12-24 ENCOUNTER — Encounter: Payer: Self-pay | Admitting: Family Medicine

## 2020-12-24 ENCOUNTER — Other Ambulatory Visit: Payer: Self-pay

## 2020-12-24 ENCOUNTER — Encounter: Payer: Self-pay | Admitting: Orthopedic Surgery

## 2020-12-24 ENCOUNTER — Ambulatory Visit (INDEPENDENT_AMBULATORY_CARE_PROVIDER_SITE_OTHER): Payer: Medicare Other | Admitting: Family Medicine

## 2020-12-24 VITALS — BP 94/60 | HR 74 | Ht 73.0 in | Wt 249.0 lb

## 2020-12-24 DIAGNOSIS — F411 Generalized anxiety disorder: Secondary | ICD-10-CM

## 2020-12-24 DIAGNOSIS — M67919 Unspecified disorder of synovium and tendon, unspecified shoulder: Secondary | ICD-10-CM | POA: Insufficient documentation

## 2020-12-24 DIAGNOSIS — J45909 Unspecified asthma, uncomplicated: Secondary | ICD-10-CM | POA: Insufficient documentation

## 2020-12-24 DIAGNOSIS — K0511 Chronic gingivitis, non-plaque induced: Secondary | ICD-10-CM | POA: Insufficient documentation

## 2020-12-24 DIAGNOSIS — F1021 Alcohol dependence, in remission: Secondary | ICD-10-CM | POA: Insufficient documentation

## 2020-12-24 DIAGNOSIS — I10 Essential (primary) hypertension: Secondary | ICD-10-CM | POA: Insufficient documentation

## 2020-12-24 DIAGNOSIS — F323 Major depressive disorder, single episode, severe with psychotic features: Secondary | ICD-10-CM | POA: Insufficient documentation

## 2020-12-24 DIAGNOSIS — F5104 Psychophysiologic insomnia: Secondary | ICD-10-CM | POA: Diagnosis not present

## 2020-12-24 DIAGNOSIS — I480 Paroxysmal atrial fibrillation: Secondary | ICD-10-CM | POA: Insufficient documentation

## 2020-12-24 DIAGNOSIS — M109 Gout, unspecified: Secondary | ICD-10-CM | POA: Insufficient documentation

## 2020-12-24 DIAGNOSIS — F4310 Post-traumatic stress disorder, unspecified: Secondary | ICD-10-CM | POA: Insufficient documentation

## 2020-12-24 DIAGNOSIS — B49 Unspecified mycosis: Secondary | ICD-10-CM | POA: Insufficient documentation

## 2020-12-24 DIAGNOSIS — I2510 Atherosclerotic heart disease of native coronary artery without angina pectoris: Secondary | ICD-10-CM | POA: Diagnosis not present

## 2020-12-24 DIAGNOSIS — I341 Nonrheumatic mitral (valve) prolapse: Secondary | ICD-10-CM | POA: Insufficient documentation

## 2020-12-24 DIAGNOSIS — K254 Chronic or unspecified gastric ulcer with hemorrhage: Secondary | ICD-10-CM | POA: Insufficient documentation

## 2020-12-24 DIAGNOSIS — F0150 Vascular dementia without behavioral disturbance: Secondary | ICD-10-CM

## 2020-12-24 DIAGNOSIS — M719 Bursopathy, unspecified: Secondary | ICD-10-CM | POA: Insufficient documentation

## 2020-12-24 DIAGNOSIS — G250 Essential tremor: Secondary | ICD-10-CM

## 2020-12-24 MED ORDER — HYDROXYZINE PAMOATE 50 MG PO CAPS: 50.0000 mg | ORAL_CAPSULE | Freq: Two times a day (BID) | ORAL | 0 refills | Status: DC | PRN

## 2020-12-24 NOTE — Progress Notes (Signed)
Mark Thomas - 70 y.o. male MRN [id number]  Date of birth: 05-Mar-1951  Subjective Chief Complaint  Patient presents with   Establish Care    HPI Mark Thomas is a 70 year old male here today for initial visit to establish care.  He has history of hypertension, coronary artery disease status post MI with stenting, bradycardia status post pacemaker, history of CVA with vascular dementia, CKD, RLS, anxiety with insomnia.   He is also had some difficulties with alcohol and benzodiazepine dependence in the past.  Prior to moving to the area he was seeing specialists and cardiology, psychiatry and neurology.  They do plan to continue to see his cardiology specialist at [facility].  He does need to establish with a new neurologist and psychiatrist here.  Reports that cardiac conditions are stable with current medications.  He denies any increased anginal symptoms, dyspnea, headaches or vision changes.  No dizziness or fatigue at this time.  His neurologist had suggested that he several of his sedating medications due to potential interactions and polypharmacy.  He is on several medications for management of anxiety and insomnia including hydroxyzine 100 mg 3 times daily, gabapentin, primidone 100 mg twice daily, Seroquel 100 mg nightly and trazodone 200 mg nightly.  He does not feel like trazodone has provided any relief.  He was only taking hydroxyzine twice per day however recently increased to 3 times daily.  His wife has noticed that he seems a little more confused at times when taking more these medications.  He is very hesitant to back down from his current regimen.  ROS:  A comprehensive ROS was completed and negative except as noted per HPI  Allergies  Allergen Reactions   Dilaudid [Hydromorphone] Other (See Comments)    Respiratory Arrest   Codeine    Penicillins    Phenobarbital     Past Medical History:  Diagnosis Date   Anxiety    Coronary artery disease    Depression    Essential tremor     GERD (gastroesophageal reflux disease)    High cholesterol    Hypertension    Myocardial infarct (HCC)    Orthostatic hypotension    Stroke ([place])    Vascular dementia ([place])     Past Surgical History:  Procedure Laterality Date   APPENDECTOMY     CARDIAC SURGERY     CHOLECYSTECTOMY     HERNIA REPAIR     NASAL SINUS SURGERY     SHOULDER SURGERY      Social History   Socioeconomic History   Marital status: Married    Spouse name: Not on file   Number of children: Not on file   Years of education: Not on file   Highest education level: Not on file  Occupational History   Not on file  Tobacco Use   Smoking status: Never    Passive exposure: Never   Smokeless tobacco: Never  Vaping Use   Vaping Use: Never used  Substance and Sexual Activity   Alcohol use: Not Currently   Drug use: Never   Sexual activity: Not on file  Other Topics Concern   Not on file  Social History Narrative   Not on file   Social Determinants of Health   Financial Resource Strain: Not on file  Food Insecurity: Not on file  Transportation Needs: Not on file  Physical Activity: Not on file  Stress: Not on file  Social Connections: Not on file    No family history on file.  Health Maintenance  Topic Date Due   Hepatitis C Screening  Never done   Zoster Vaccines- Shingrix (1 of 2) Never done   COLONOSCOPY (Pts 45-14yr Insurance coverage will need to be confirmed)  Never done   PNA vac Low Risk Adult (2 of 2 - PPSV23) 03/19/2020   COVID-19 Vaccine (4 - Booster for Moderna series) 07/15/2020   INFLUENZA VACCINE  11/30/2020   TETANUS/TDAP  09/19/2024   HPV VACCINES  Aged Out     ----------------------------------------------------------------------------------------------------------------------------------------------------------------------------------------------------------------- Physical Exam BP 94/60 (BP Location: Left Arm, Patient Position: Sitting)   Pulse 74   Ht 6' 1" (1.854  m)   Wt 249 lb (112.9 kg)   SpO2 98%   BMI 32.85 kg/m   Physical Exam Constitutional:      Appearance: Normal appearance.  HENT:     Head: Normocephalic and atraumatic.  Eyes:     General: No scleral icterus. Cardiovascular:     Rate and Rhythm: Normal rate and regular rhythm.  Pulmonary:     Effort: Pulmonary effort is normal.     Breath sounds: Normal breath sounds.  Musculoskeletal:     Cervical back: Neck supple.  Skin:    General: Skin is warm and dry.  Neurological:     General: No focal deficit present.     Mental Status: He is alert.  Psychiatric:        Mood and Affect: Mood normal.        Behavior: Behavior normal.    ------------------------------------------------------------------------------------------------------------------------------------------------------------------------------------------------------------------- Assessment and Plan  Coronary arteriosclerosis He will [facility]  He denies any increased anginal symptoms at this time.  Recommend continuation of current medications.  Paroxysmal atrial fibrillation (HCC) History of A. fib, status post ablation x2.  He has remained in normal sinus rhythm since second ablation.  He does have a watch man device as well.  He will continue management per his current cardiologist.  Chronic alcoholism in remission ([facility] He has been sober for approximately 3 years now, congratulated on this.  We will need to avoid potentially habit-forming substances including benzodiazepines and certain sleep medications.  Insomnia He does not find trazodone particularly helpful even at 200 mg.  We will discontinue this.  Hydroxyzine has been helpful however he is taking higher doses of this.  Reviewed anticholinergic effects of hydroxyzine and discussed how this may worsen confusion related to dementia.  I have asked him to reduce this to 50 mg no more than twice per day.    Essential  tremor Stable with current dose of primidone.  Referral placed to neurology.  GAD (generalized anxiety disorder) We will continue Lexapro with hydroxyzine.  He is taking Seroquel as needed for sleep as well.  Referral placed to geriatric psychiatry.   Meds ordered this encounter  Medications   hydrOXYzine (VISTARIL) 50 MG capsule    Sig: Take 1 capsule (50 mg total) by mouth 2 (two) times daily as needed for anxiety.    Dispense:  14 capsule    Refill:  0    Return in about 1 month (around 01/24/2021) for Insomnia/anxiety.    This visit occurred during the SARS-CoV-2 public health emergency.  Safety protocols were in place, including screening questions prior to the visit, additional usage of staff PPE, and extensive cleaning of exam room while observing appropriate contact time as indicated for disinfecting solutions.

## 2020-12-24 NOTE — Assessment & Plan Note (Signed)
Stable with current dose of primidone.  Referral placed to neurology.

## 2020-12-24 NOTE — Assessment & Plan Note (Signed)
He does not find trazodone particularly helpful even at 200 mg.  We will discontinue this.  Hydroxyzine has been helpful however he is taking higher doses of this.  Reviewed anticholinergic effects of hydroxyzine and discussed how this may worsen confusion related to dementia.  I have asked him to reduce this to 50 mg no more than twice per day.

## 2020-12-24 NOTE — Assessment & Plan Note (Signed)
History of A. fib, status post ablation x2.  He has remained in normal sinus rhythm since second ablation.  He does have a watch man device as well.  He will continue management per his current cardiologist.

## 2020-12-24 NOTE — Assessment & Plan Note (Signed)
He will continue to see his cardiologist through [facility].  He denies any increased anginal symptoms at this time.  Recommend continuation of current medications.

## 2020-12-24 NOTE — Progress Notes (Signed)
Office Visit Note   Patient: Mark Thomas           Date of Birth: April 10, 1951           MRN: [id number] Visit Date: 12/16/2020 Requested by: Odette Horns, MD [address] [place],  [place] [id number] PCP: Pcp, No  Subjective: Chief Complaint  Patient presents with   Right Shoulder - Pain    HPI: Mark Thomas is a 70 year old patient with right shoulder pain.  He has had pain for the past 7 months.  He had a stroke in January of this year.  He has fallen several times that since that strength and injured his shoulder as part of those falls.  He just moved here from [place] on July 8.  He is right-hand dominant.  In 2012 he had a rotator cuff repair in [place].  Describes decreased range of motion along with constant pain catching and grinding.  One of the 3 strokes that he has had within the past year affected his right side.  He has had 2 ablations and has a pacemaker in place which by his report can be MRI compatible with [employer] representative involvement.  He did have a cortisone shot in March of this year which did not help.  He reports some shoulder swelling in July 2021 which he has a picture of.  Patient has also been diagnosed with vascular dementia in May 2022.  This raises balance off.  He was advised by his medical physician not to have surgery until 6 months after atrial heart surgery.  He does take Plavix and aspirin.              ROS: All systems reviewed are negative as they relate to the chief complaint within the history of present illness.  Patient denies  fevers or chills.   Assessment & Plan: Visit Diagnoses:  1. Right shoulder pain, unspecified chronicity     Plan: Impression is right shoulder pain likely with a component of rotator cuff arthropathy.  Patient has multiple mitigating medical risk factors for any type of major surgery.  Plan MRI not an arthrogram shoulder to evaluate the rotator cuff tear.  Valium prescribed.  Follow-up after that study for further  discussion about intervention.  Follow-Up Instructions: Return for after MRI.   Orders:  Orders Placed This Encounter  Procedures   XR Shoulder Right   MR SHOULDER RIGHT WO CONTRAST   Meds ordered this encounter  Medications   diazepam (VALIUM) 5 MG tablet    Sig: 1 tab po 1 hour prior to MRI scan, repeat as needed    Dispense:  4 tablet    Refill:  0      Procedures: No procedures performed   Clinical Data: No additional findings.  Objective: Vital Signs: There were no vitals taken for this visit.  Physical Exam:   Constitutional: Patient appears well-developed HEENT:  Head: Normocephalic Eyes:EOM are normal Neck: Normal range of motion Cardiovascular: Normal rate Pulmonary/chest: Effort normal Neurologic: Patient is alert Skin: Skin is warm Psychiatric: Patient has normal mood and affect   Ortho Exam: Ortho exam demonstrates pain with passive motion in the right shoulder.  There are some coarseness and grinding with internal ex rotation of the right shoulder.  Motor sensory function to the hand is intact.  Patient has 5 out of 5 grip EPL FPL interosseous wrist flexion extension bicep triceps and deltoid strength.  Does use a walker or cane at home.  As far as  active motion he is just shy of shoulder level forward flexion.  External rotation strength is weaker on the right than the left as his subscap strength.  No discrete AC joint tenderness.  Specialty Comments:  No specialty comments available.  Imaging: No results found.   PMFS History: There are no problems to display for this patient.  Past Medical History:  Diagnosis Date   Anxiety    Coronary artery disease    Depression    Essential tremor    GERD (gastroesophageal reflux disease)    High cholesterol    Hypertension    Myocardial infarct (HCC)    Orthostatic hypotension    Stroke [facility])    Vascular dementia ([place])     No family history on file.  Past Surgical History:  Procedure  Laterality Date   APPENDECTOMY     CARDIAC SURGERY     CHOLECYSTECTOMY     HERNIA REPAIR     NASAL SINUS SURGERY     SHOULDER SURGERY     Social History   Occupational History   Not on file  Tobacco Use   Smoking status: Never    Passive exposure: Never   Smokeless tobacco: Never  Vaping Use   Vaping Use: Never used  Substance and Sexual Activity   Alcohol use: Not Currently   Drug use: Never   Sexual activity: Not on file

## 2020-12-24 NOTE — Assessment & Plan Note (Signed)
We will continue Lexapro with hydroxyzine.  He is taking Seroquel as needed for sleep as well.  Referral placed to geriatric psychiatry.

## 2020-12-24 NOTE — Patient Instructions (Signed)
Very nice to meet you today! Decrease hydroxyzine to 62m in the afternoon and 53min the evening.   Decrease trazodone to [id number] the next week and then discontinue.  Follow up with me in 1 month.

## 2020-12-24 NOTE — Assessment & Plan Note (Signed)
He has been sober for approximately 3 years now, congratulated on this.  We will need to avoid potentially habit-forming substances including benzodiazepines and certain sleep medications.

## 2020-12-25 ENCOUNTER — Encounter: Payer: Self-pay | Admitting: Family Medicine

## 2020-12-25 NOTE — Telephone Encounter (Signed)
This was sent to hospital to see if can schedule pt

## 2020-12-27 ENCOUNTER — Emergency Department (HOSPITAL_BASED_OUTPATIENT_CLINIC_OR_DEPARTMENT_OTHER): Payer: Medicare Other

## 2020-12-27 ENCOUNTER — Emergency Department (HOSPITAL_BASED_OUTPATIENT_CLINIC_OR_DEPARTMENT_OTHER)
Admission: EM | Admit: 2020-12-27 | Discharge: 2020-12-28 | Disposition: A | Discharge status: Home / Self Care | Payer: Medicare Other | Attending: Emergency Medicine | Admitting: Emergency Medicine

## 2020-12-27 ENCOUNTER — Emergency Department (HOSPITAL_BASED_OUTPATIENT_CLINIC_OR_DEPARTMENT_OTHER): Payer: Medicare Other | Admitting: Radiology

## 2020-12-27 ENCOUNTER — Other Ambulatory Visit: Payer: Self-pay

## 2020-12-27 ENCOUNTER — Encounter (HOSPITAL_BASED_OUTPATIENT_CLINIC_OR_DEPARTMENT_OTHER): Payer: Self-pay

## 2020-12-27 DIAGNOSIS — I2510 Atherosclerotic heart disease of native coronary artery without angina pectoris: Secondary | ICD-10-CM | POA: Insufficient documentation

## 2020-12-27 DIAGNOSIS — J45909 Unspecified asthma, uncomplicated: Secondary | ICD-10-CM | POA: Diagnosis not present

## 2020-12-27 DIAGNOSIS — N183 Chronic kidney disease, stage 3 unspecified: Secondary | ICD-10-CM | POA: Insufficient documentation

## 2020-12-27 DIAGNOSIS — Y92009 Unspecified place in unspecified non-institutional (private) residence as the place of occurrence of the external cause: Secondary | ICD-10-CM | POA: Insufficient documentation

## 2020-12-27 DIAGNOSIS — I129 Hypertensive chronic kidney disease with stage 1 through stage 4 chronic kidney disease, or unspecified chronic kidney disease: Secondary | ICD-10-CM | POA: Diagnosis not present

## 2020-12-27 DIAGNOSIS — R519 Headache, unspecified: Secondary | ICD-10-CM | POA: Diagnosis not present

## 2020-12-27 DIAGNOSIS — Z7982 Long term (current) use of aspirin: Secondary | ICD-10-CM | POA: Diagnosis not present

## 2020-12-27 DIAGNOSIS — Z79899 Other long term (current) drug therapy: Secondary | ICD-10-CM | POA: Insufficient documentation

## 2020-12-27 DIAGNOSIS — W01198A Fall on same level from slipping, tripping and stumbling with subsequent striking against other object, initial encounter: Secondary | ICD-10-CM | POA: Insufficient documentation

## 2020-12-27 DIAGNOSIS — Z7902 Long term (current) use of antithrombotics/antiplatelets: Secondary | ICD-10-CM | POA: Diagnosis not present

## 2020-12-27 DIAGNOSIS — M25511 Pain in right shoulder: Secondary | ICD-10-CM | POA: Diagnosis not present

## 2020-12-27 DIAGNOSIS — Z7901 Long term (current) use of anticoagulants: Secondary | ICD-10-CM

## 2020-12-27 DIAGNOSIS — R079 Chest pain, unspecified: Secondary | ICD-10-CM | POA: Insufficient documentation

## 2020-12-27 DIAGNOSIS — Z950 Presence of cardiac pacemaker: Secondary | ICD-10-CM | POA: Diagnosis not present

## 2020-12-27 DIAGNOSIS — W19XXXA Unspecified fall, initial encounter: Secondary | ICD-10-CM

## 2020-12-27 DIAGNOSIS — S0990XA Unspecified injury of head, initial encounter: Secondary | ICD-10-CM

## 2020-12-27 LAB — BASIC METABOLIC PANEL
Anion gap: 9 (ref 5–15)
BUN: 15 mg/dL (ref 8–23)
CO2: 27 mmol/L (ref 22–32)
Calcium: 9.2 mg/dL (ref 8.9–10.3)
Chloride: 102 mmol/L (ref 98–111)
Creatinine, Ser: 1.11 mg/dL (ref 0.61–1.24)
GFR, Estimated: >60 mL/min (ref >60)
Glucose, Bld: 105 mg/dL — ABNORMAL HIGH (ref 70–99)
Potassium: 4.5 mmol/L (ref 3.5–5.1)
Sodium: 138 mmol/L (ref 135–145)

## 2020-12-27 LAB — CBC
HCT: 38.3 % — ABNORMAL LOW (ref 39.0–52.0)
Hemoglobin: 12.4 g/dL — ABNORMAL LOW (ref 13.0–17.0)
MCH: 31 pg (ref 26.0–34.0)
MCHC: 32.4 g/dL (ref 30.0–36.0)
MCV: 95.8 fL (ref 80.0–100.0)
Platelets: 192 10*3/uL (ref 150–400)
RBC: 4 MIL/uL — ABNORMAL LOW (ref 4.22–5.81)
RDW: 14.6 % (ref 11.5–15.5)
WBC: 6.7 10*3/uL (ref 4.0–10.5)
nRBC: 0 % (ref 0.0–0.2)

## 2020-12-27 LAB — TROPONIN I (HIGH SENSITIVITY): Troponin I (High Sensitivity): 12 ng/L (ref <18)

## 2020-12-27 IMAGING — CT CT HEAD W/O CM
4 series · 17 of 47 positions shown, 19 images · non-contrast
Comparison: [DATE]

CLINICAL DATA: Status post fall.

EXAM:
CT HEAD WITHOUT CONTRAST
TECHNIQUE: Contiguous axial images were obtained from the base of the skull
through the vertex without intravenous contrast.

[Series 2: head bone · axial · 0.47mm/px · z∈[-123,-65]mm · 4 of 85 slices shown]
[im 9/85  bone]
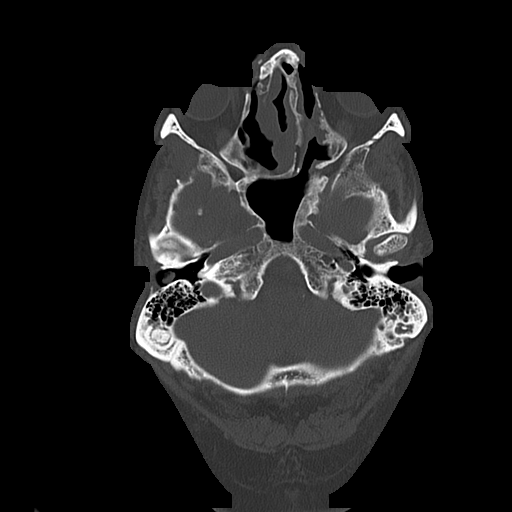
[im 17/85  bone]
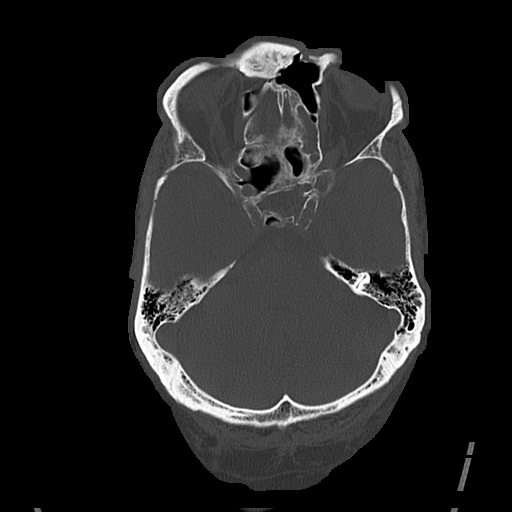
[im 26/85  bone]
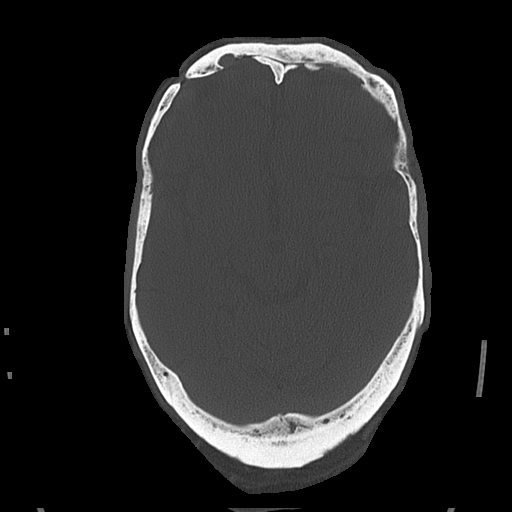
[im 38/85  bone]
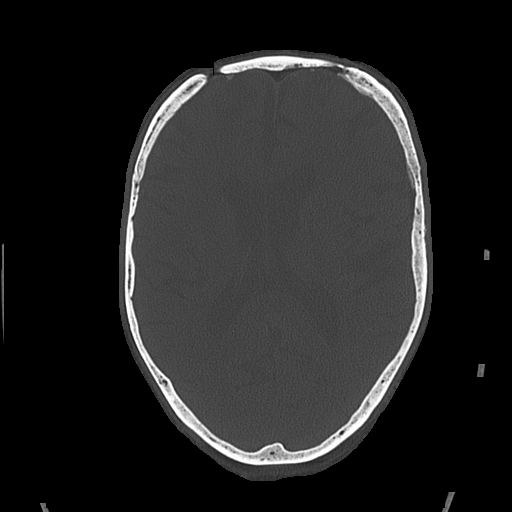

[Series 3: head wo · axial · 0.47mm/px · z∈[-119,+1]mm · 7 of 34 slices shown, 9 images]
[im 5/34  brain]
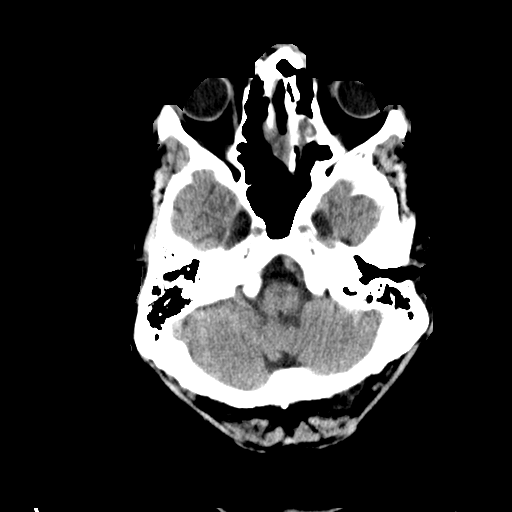
[im 5/34  bone]
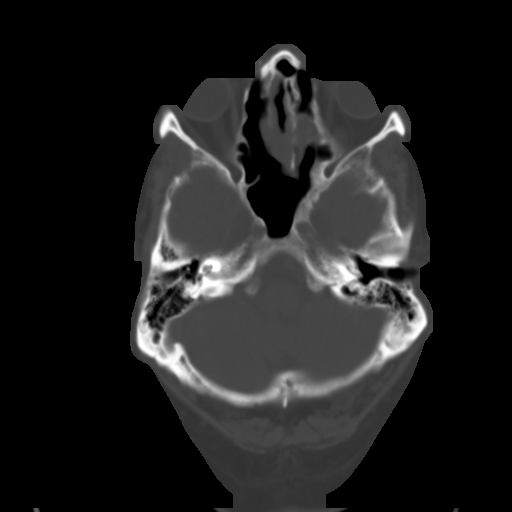
[im 9/34  brain]
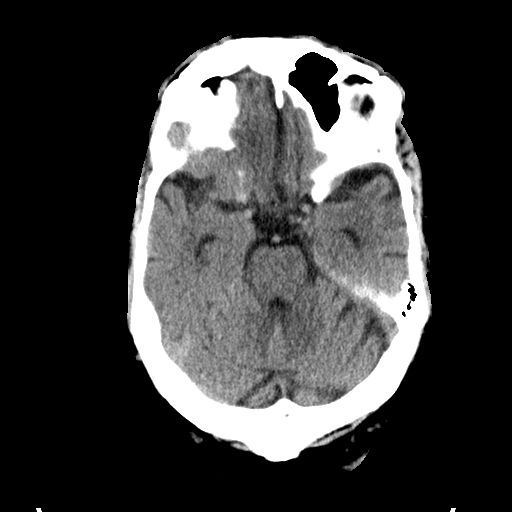
[im 13/34  brain]
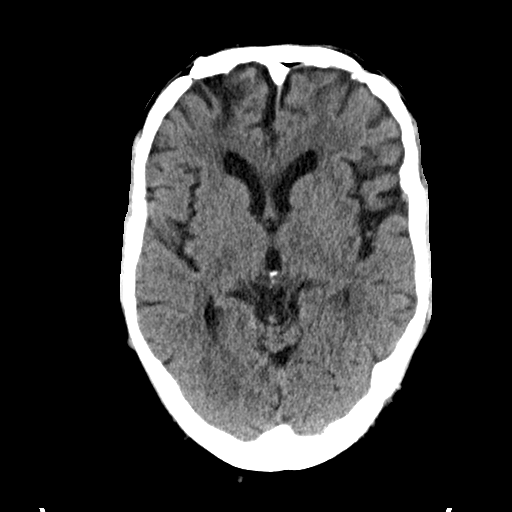
[im 17/34  brain]
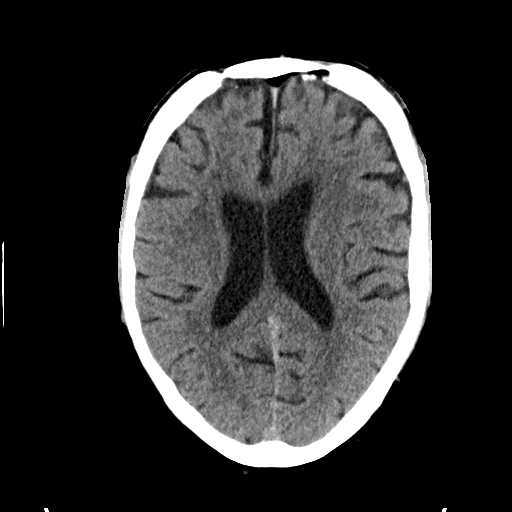
[im 21/34  brain]
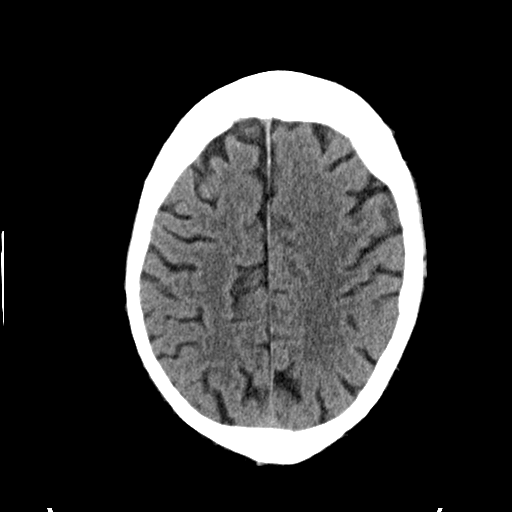
[im 21/34  bone]
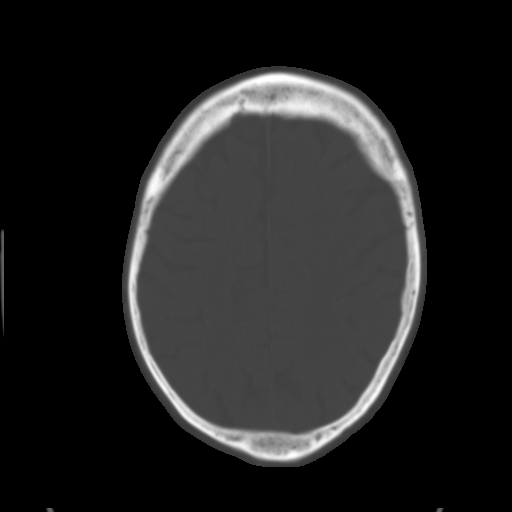
[im 25/34  brain]
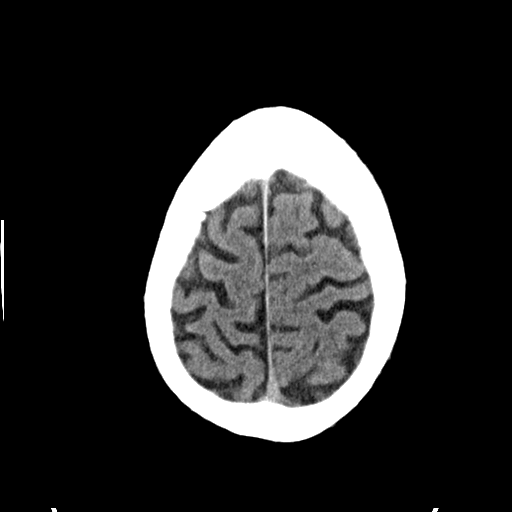
[im 29/34  brain]
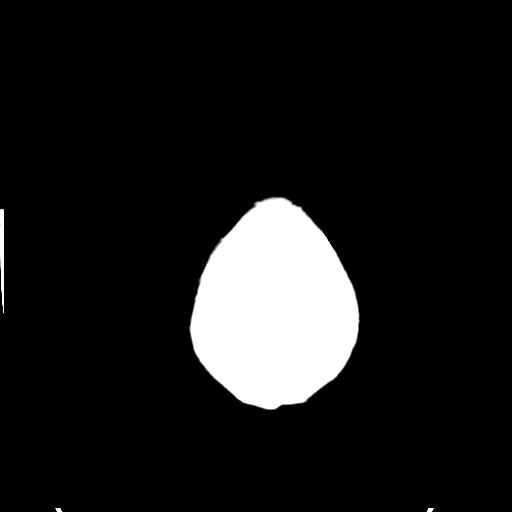

[Series 9: coronal soft · coronal · 0.32mm/px · 3 of 70 slices shown]
[im 25/70  brain]
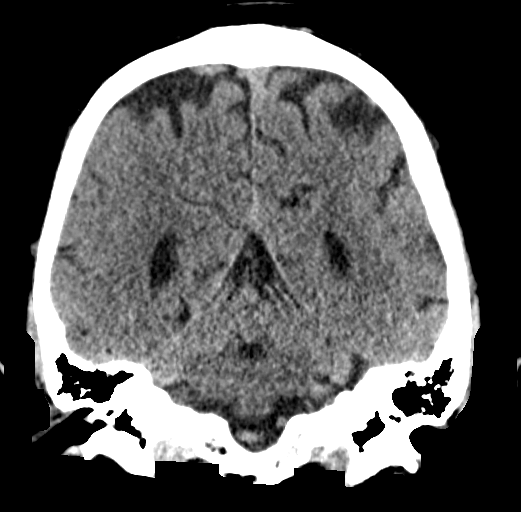
[im 32/70  brain]
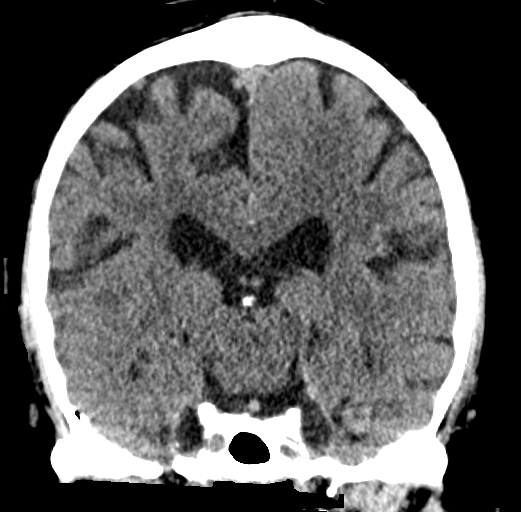
[im 38/70  brain]
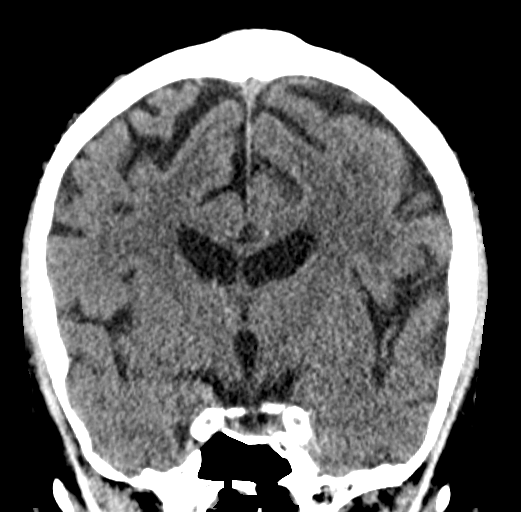

[Series 10: sagittal soft · sagittal · 0.32mm/px · 3 of 57 slices shown]
[im 19/57  brain]
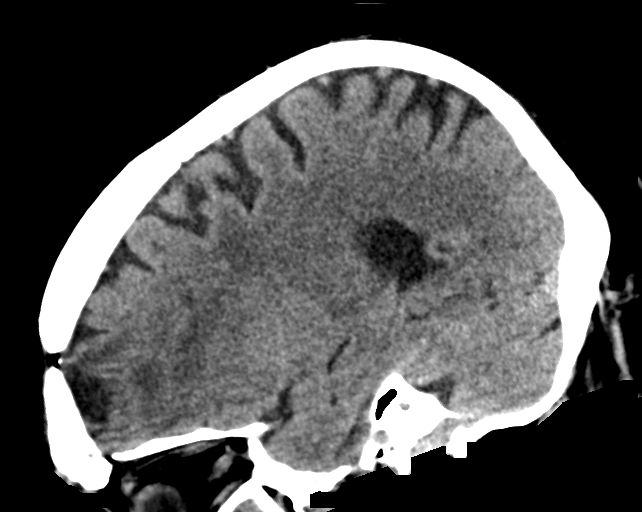
[im 29/57  brain]
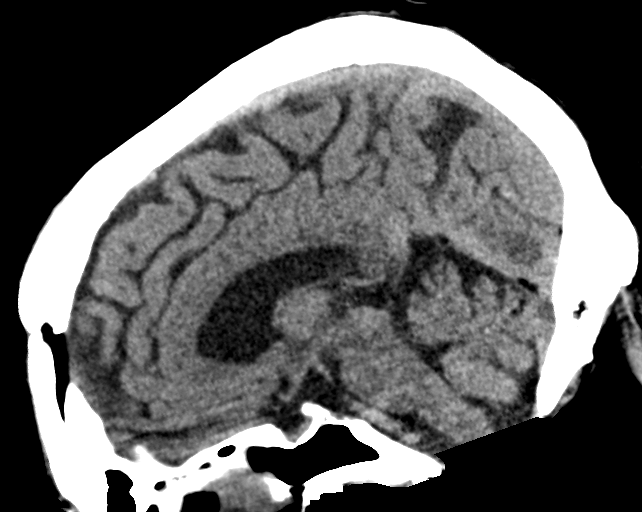
[im 38/57  brain]
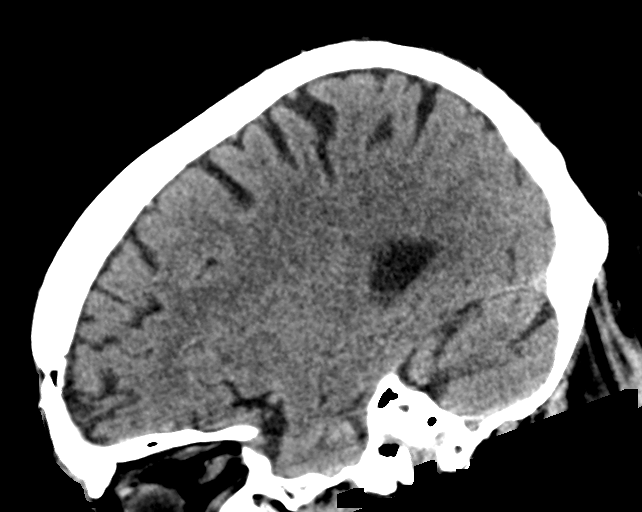

[17 of 47 positions shown; findings below may reference images not displayed]

FINDINGS: Brain: There is mild cerebral atrophy with widening of the
extra-axial spaces and ventricular dilatation.
There are areas of decreased attenuation within the white matter
tracts of the supratentorial brain, consistent with microvascular
disease changes.

Areas of bilateral frontal lobe encephalomalacia, and adjacent
chronic white matter low attenuation, are again noted.

Vascular: No hyperdense vessel or unexpected calcification.

Skull: A frontal craniotomy defect is seen along the midline.

Sinuses/Orbits: Marked severity bilateral maxillary sinus, left
ethmoid sinus and bilateral nasal mucosal thickening is seen.

Other: None.
IMPRESSION: 1. Stable postoperative changes consistent with prior bifrontal
craniotomy with areas of bilateral frontal lobe encephalomalacia.
2. No acute intracranial abnormality.
3. Marked severity pansinus disease.

## 2020-12-27 IMAGING — CT CT CERVICAL SPINE W/O CM
3 of 4 series · 10 of 33 positions shown, 11 images · non-contrast
Comparison: None.

CLINICAL DATA: Status post slip and fall.

EXAM:
CT CERVICAL SPINE WITHOUT CONTRAST
TECHNIQUE: Multidetector CT imaging of the cervical spine was performed without
intravenous contrast. Multiplanar CT image reconstructions were also
generated.

[Series 7: cor bone · coronal · 0.29mm/px · 3 of 59 slices shown]
[im 15/59  bone]
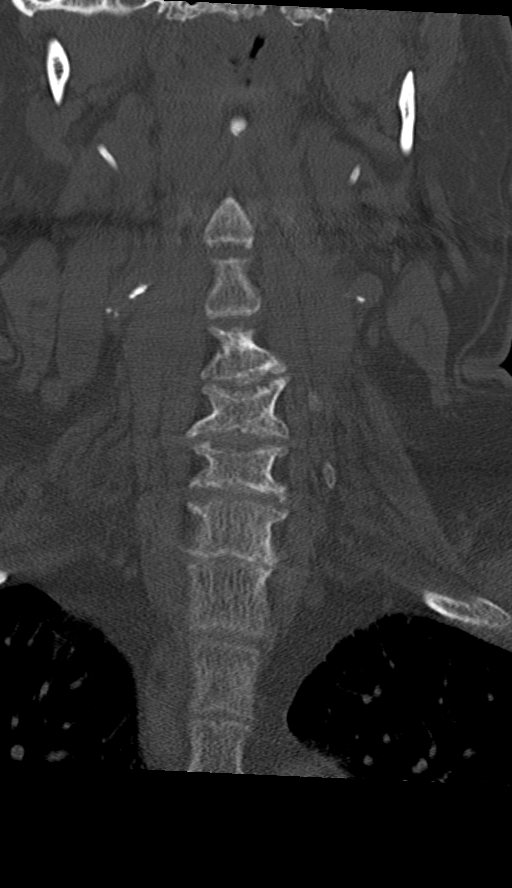
[im 25/59  bone]
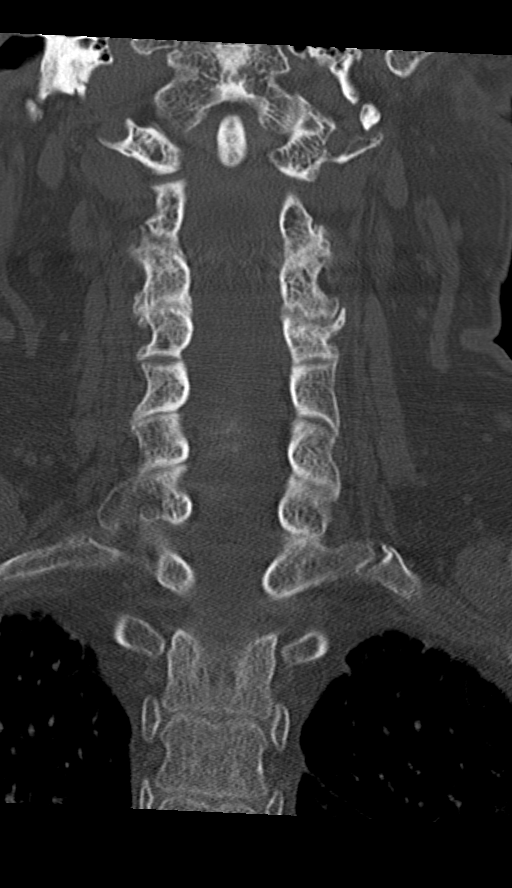
[im 35/59  bone]
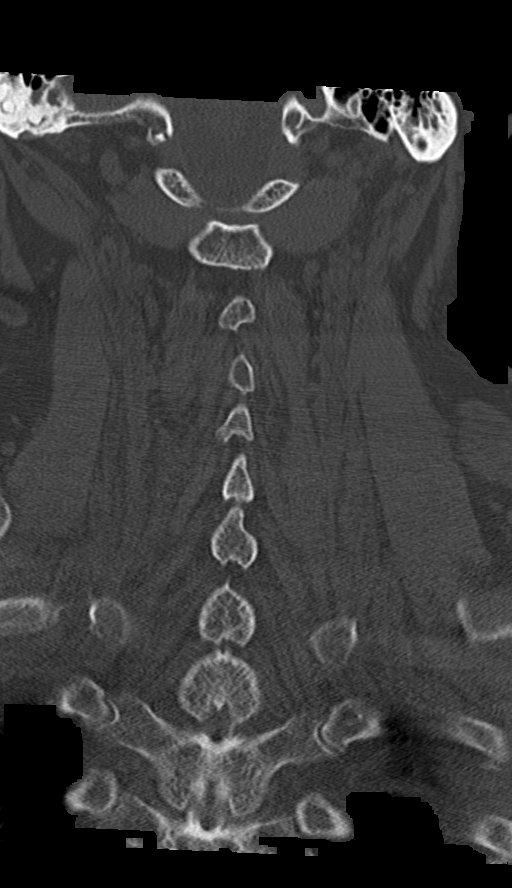

[Series 8: sag bone · sagittal · 0.23mm/px · 5 of 74 slices shown]
[im 25/74  bone]
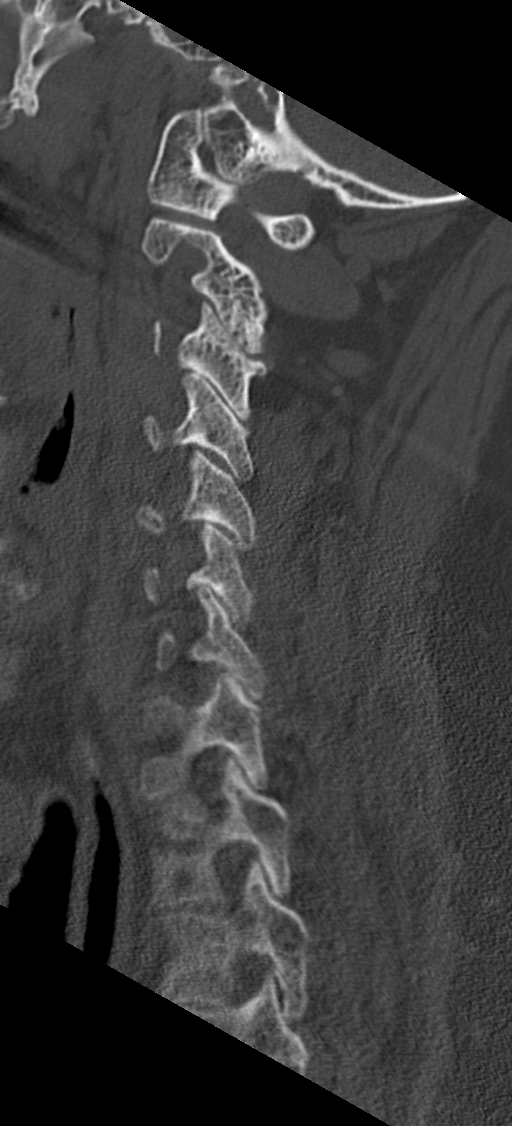
[im 31/74  bone]
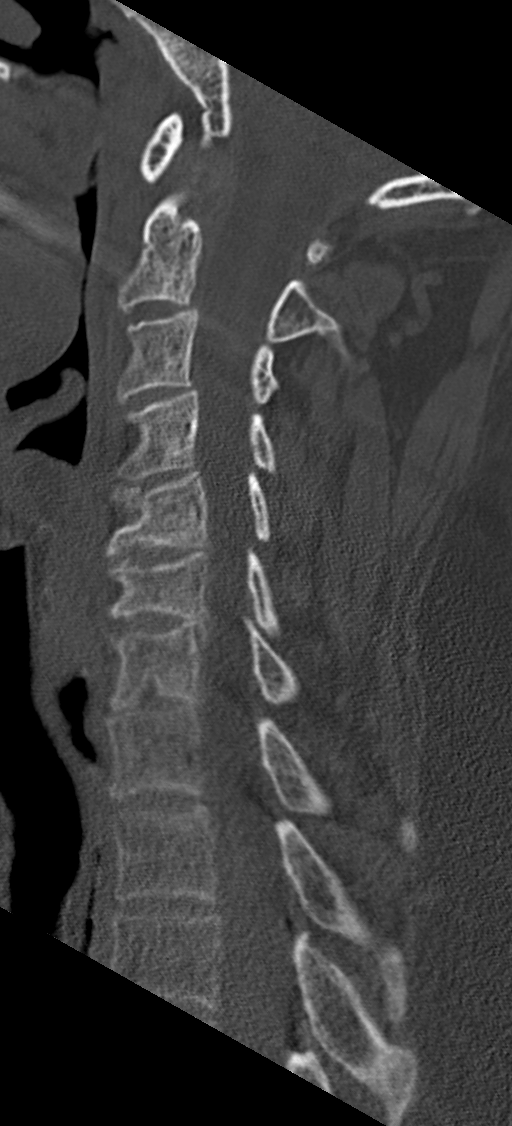
[im 37/74  bone]
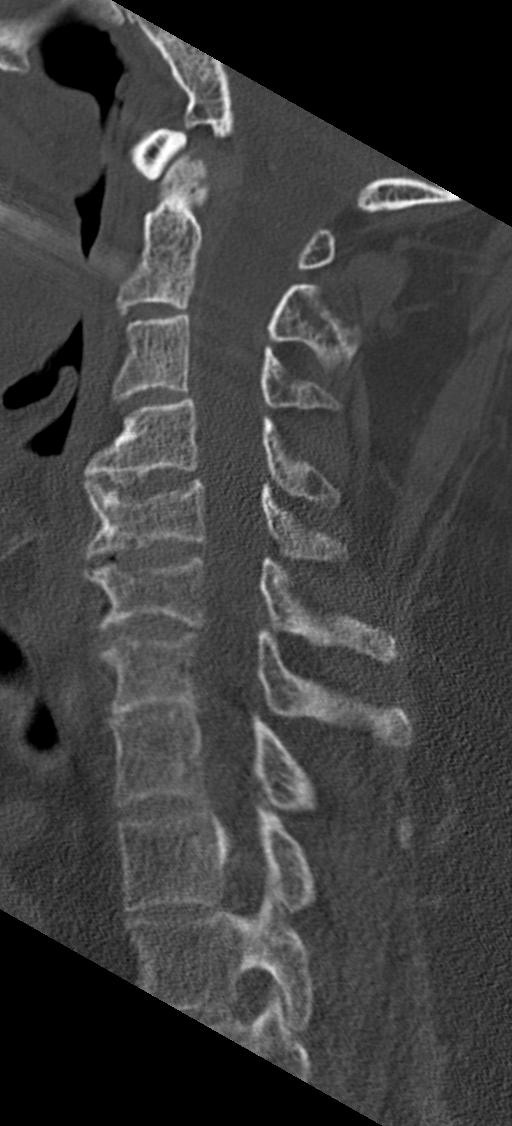
[im 43/74  bone]
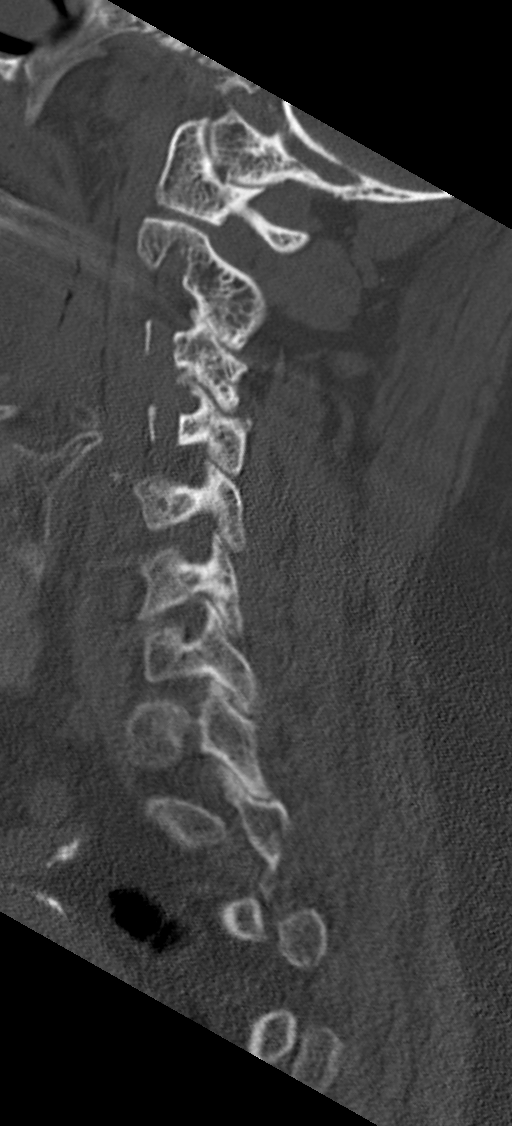
[im 49/74  bone]
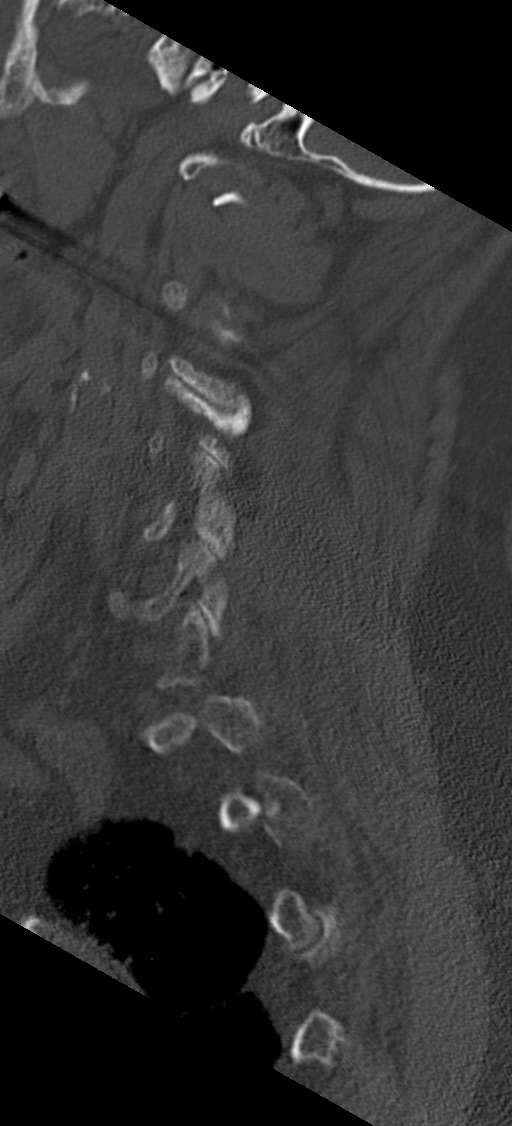

[Series 11: orthogonal axials · axial · 0.21mm/px · z∈[-288,-230]mm · 2 of 107 slices shown, 3 images]
[im 36/107  soft-tissue]
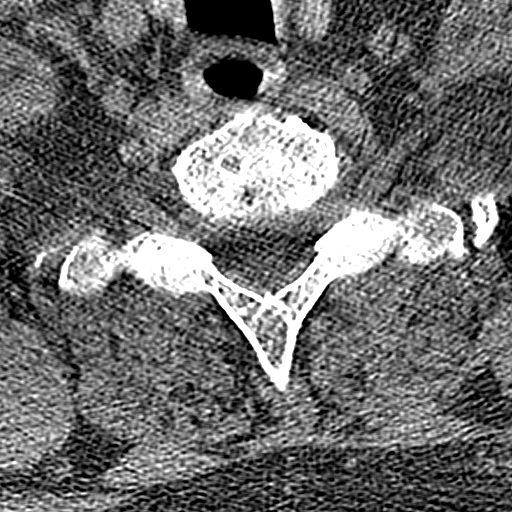
[im 36/107  bone]
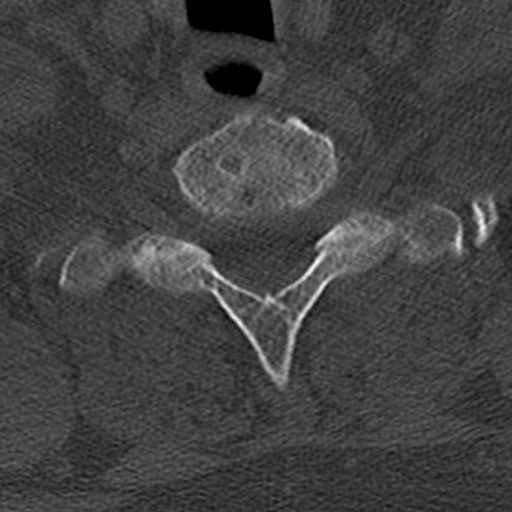
[im 71/107  bone]
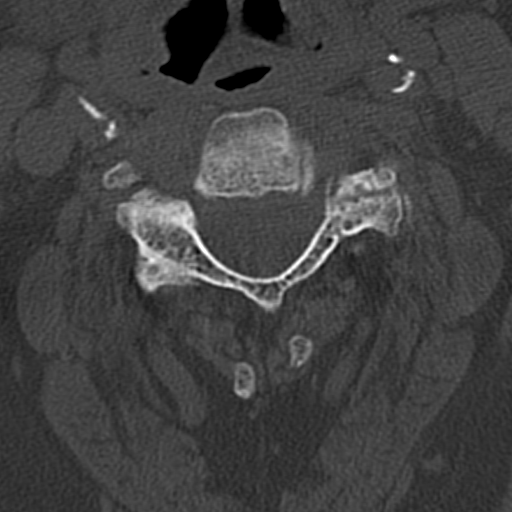

[10 of 33 positions shown; findings below may reference images not displayed]

FINDINGS: Alignment: Normal.

Skull base and vertebrae: Chronic loss of vertebral body height is
seen involving the C5 and C6 vertebral bodies. No primary bone
lesion or focal pathologic process.

Soft tissues and spinal canal: No prevertebral fluid or swelling. No
visible canal hematoma.

Disc levels: Moderate to marked severity endplate sclerosis and
anterior osteophyte formation is seen at the levels of C4-C5, C5-C6,
C6-C7 and C7-T1.

Marked severity intervertebral disc space narrowing is seen at the
level of C7-T1. Mild to moderate severity multilevel intervertebral
disc space narrowing is seen throughout the remainder of the
cervical spine.

Bilateral moderate to marked severity multilevel facet joint
hypertrophy is noted.

Upper chest: Negative.

Other: None.
IMPRESSION: 1. Chronic appearing loss of vertebral body height involving the C5
and C6 vertebral bodies. MRI correlation is recommended if acute
cervical spine fracture remains of clinical concern.
2. Moderate to marked severity multilevel degenerative changes, most
prominent at the levels of C4-C5, C5-C6, C6-C7 and C7-T1.

## 2020-12-27 IMAGING — DX DG CHEST 1V
1 series · 1 of 1 positions shown · non-contrast
Comparison: [DATE]

CLINICAL DATA: Status post slip and fall.

EXAM:
CHEST  1 VIEW

[chest]
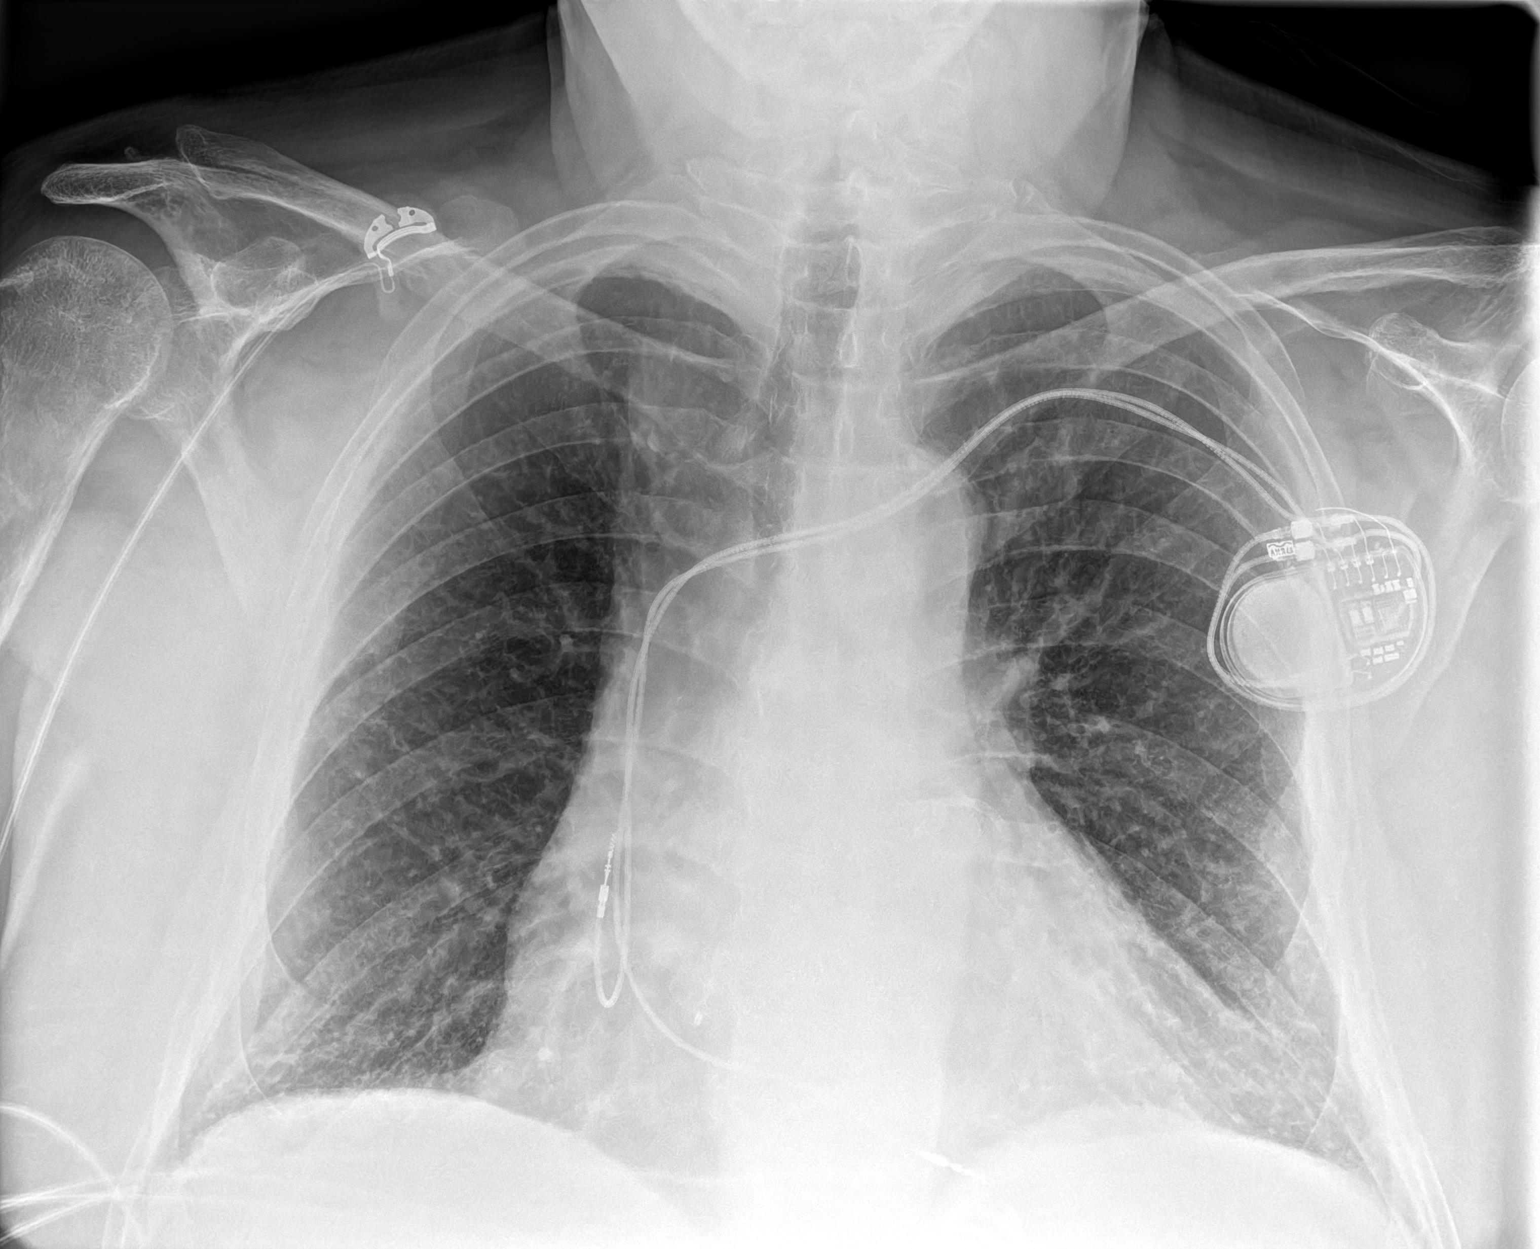

[1 of 1 positions shown; findings below may reference images not displayed]

FINDINGS: There is a dual lead AICD. Stable, diffuse, chronic appearing
increased interstitial lung markings are noted. There is no evidence
of acute infiltrate, pleural effusion or pneumothorax. The cardiac
silhouette is mildly enlarged and unchanged in size. Degenerative
changes are noted throughout the thoracic spine.
IMPRESSION: Stable cardiomegaly without active cardiopulmonary disease.

## 2020-12-27 IMAGING — DX DG SHOULDER 2+V*R*
1 series · 3 of 3 positions shown · non-contrast
Comparison: Right shoulder radiograph dated [DATE]

CLINICAL DATA: Fall and trauma to the right shoulder.

EXAM:
RIGHT SHOULDER - 2+ VIEW

[Series 1: shoulder · 0.14mm/px · 3 of 3 slices shown]
[im 1/3]
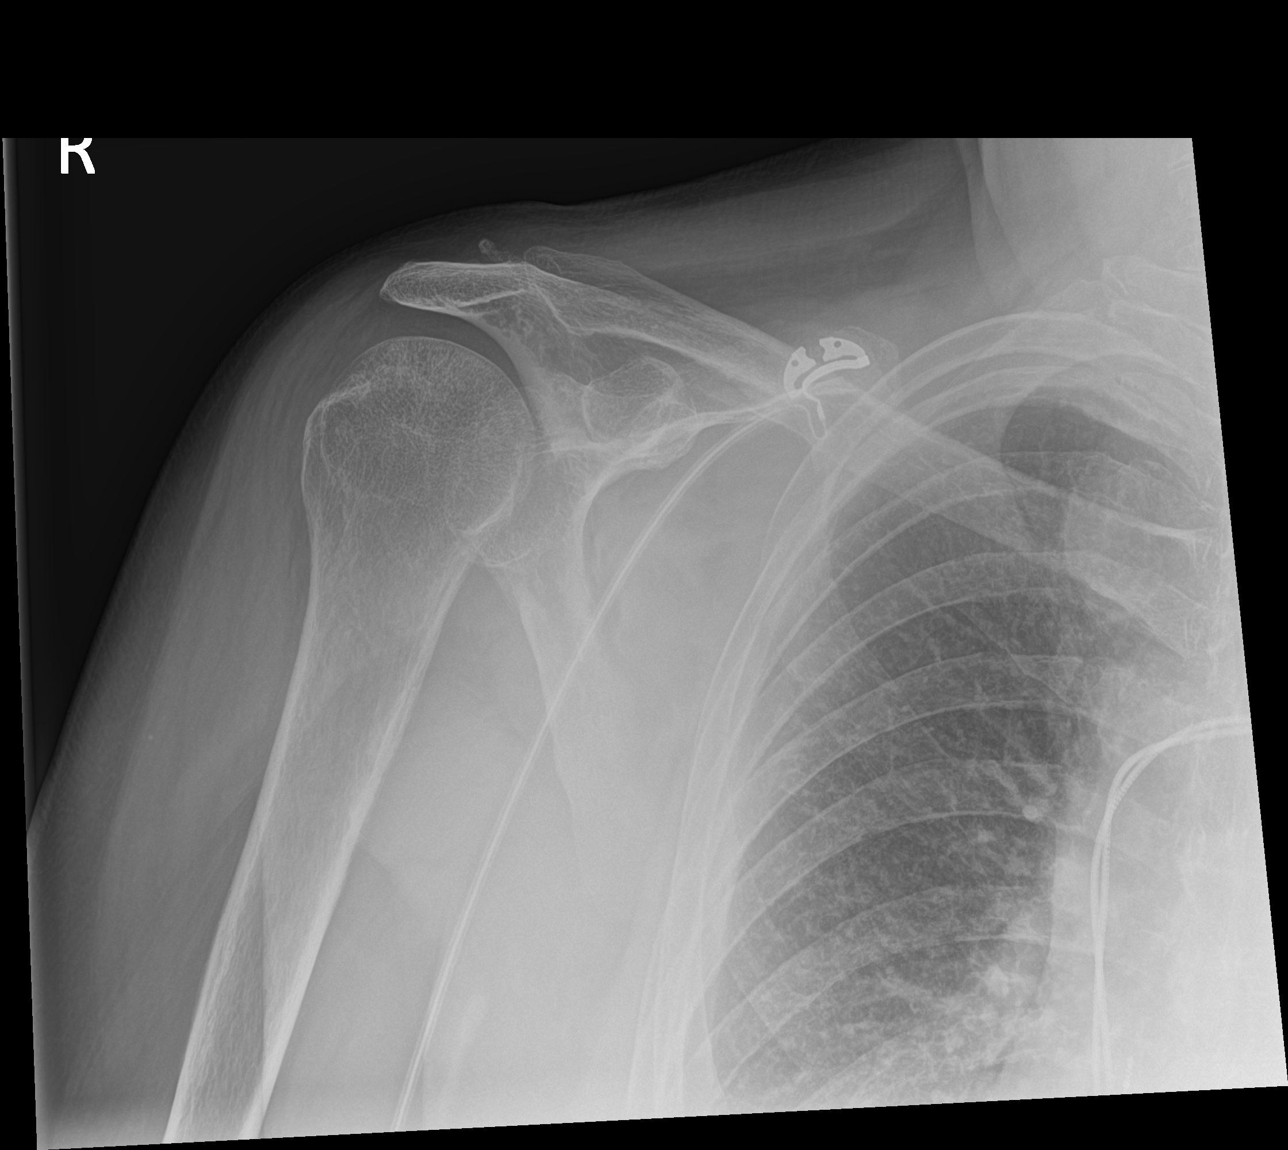
[im 2/3]
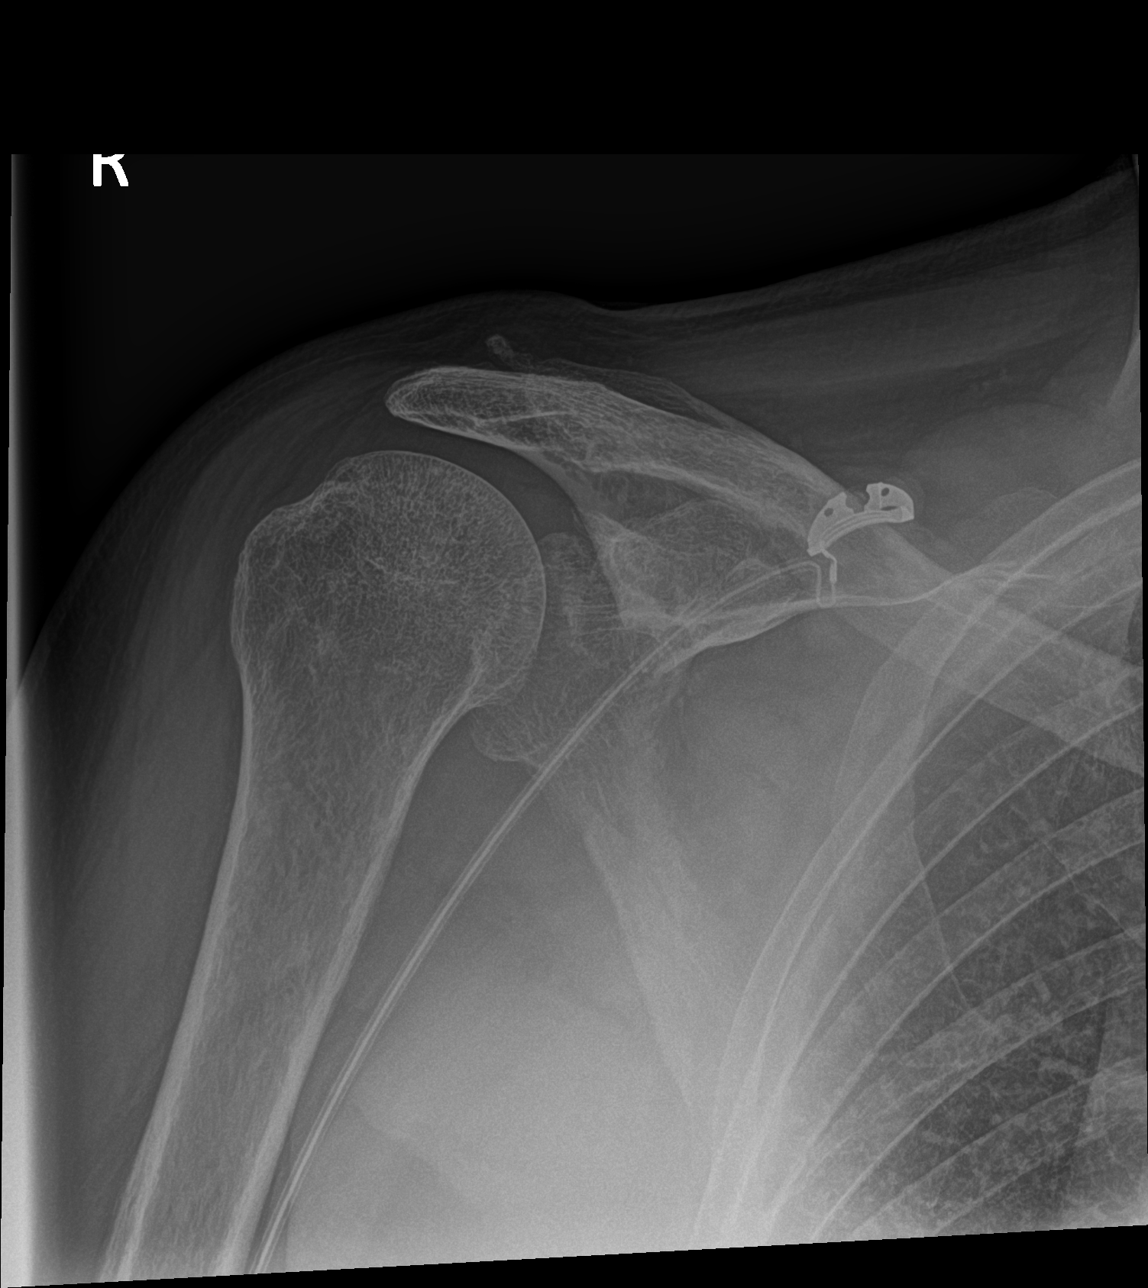
[im 3/3]
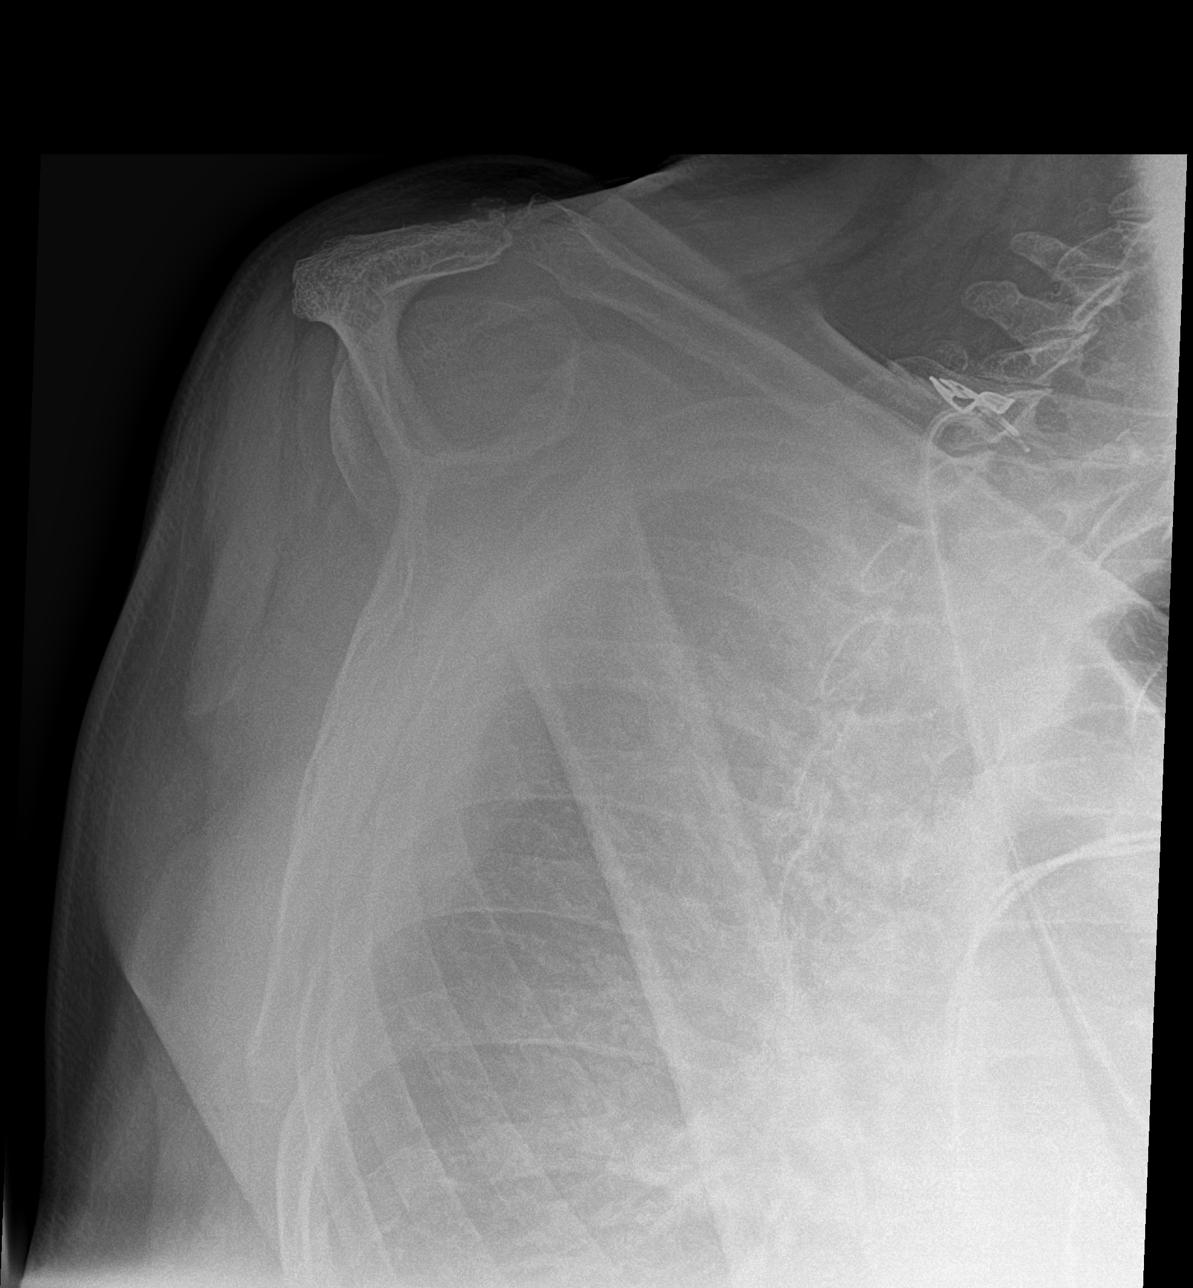

[3 of 3 positions shown; findings below may reference images not displayed]

FINDINGS: There is no acute fracture or dislocation the bones are osteopenic.
The soft tissues are unremarkable.
IMPRESSION: No acute fracture or dislocation.

## 2020-12-27 MED ORDER — FENTANYL CITRATE (PF) 100 MCG/2ML IJ SOLN
25.0000 ug | Freq: Once | INTRAMUSCULAR | Status: AC
Start: 2020-12-27 — End: 2020-12-28
  Administered 2020-12-28: 25 ug via INTRAVENOUS
  Filled 2020-12-27: qty 2

## 2020-12-27 MED ORDER — FENTANYL CITRATE (PF) 100 MCG/2ML IJ SOLN
25.0000 ug | Freq: Once | INTRAMUSCULAR | Status: AC
  Administered 2020-12-27: 25 ug via INTRAVENOUS
  Filled 2020-12-27: qty 2

## 2020-12-27 NOTE — ED Notes (Signed)
Patient transported to CT 

## 2020-12-27 NOTE — ED Notes (Signed)
Unable to start IV at this time due to transport to CT

## 2020-12-27 NOTE — ED Provider Notes (Addendum)
Mark Thomas Provider Note   CSN: [id number] Arrival date & time: 12/27/20  1818     History Chief Complaint  Patient presents with   Fall   Chest Pain    Mark Thomas is a 70 y.o. male.  Mark Thomas presents after a fall.  He was not using his walker.  He does have difficulties with balance.  He got up from using the toilet and collapsed on the floor striking the right side of his head and shoulder.  He is currently experiencing pain in his right head, right shoulder, and right chest.  He is on anticoagulation.  The history is provided by the patient and the spouse.  Fall This is a new problem. Episode onset: a few hours ago. Episode frequency: one episode. The problem has not changed since onset.Associated symptoms include chest pain. Pertinent negatives include no abdominal pain, no headaches and no shortness of breath. Exacerbated by: use of right arm. The symptoms are relieved by rest. He has tried nothing for the symptoms. The treatment provided no relief.      Past Medical History:  Diagnosis Date   Anxiety    Coronary artery disease    Depression    Essential tremor    GERD (gastroesophageal reflux disease)    High cholesterol    Hypertension    Myocardial infarct (HCC)    Orthostatic hypotension    Stroke [facility])    Vascular dementia [facility])     Patient Active Problem List   Diagnosis Date Noted   Coronary arteriosclerosis 12/24/2020   Asthma 12/24/2020   Chronic alcoholism in remission ([place]) 12/24/2020   Disorder of bursae and tendons in shoulder region 12/24/2020   Benign hypertension 12/24/2020   Gastric ulcer with hemorrhage 12/24/2020   Gout 12/24/2020   Major depressive disorder, single episode, severe, 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[place]) 12/24/2020   Mitral valve prolapse 12/24/2020   Other and unspecified mycoses 12/24/2020   Chronic gingivitis, non-plaque induced 12/24/2020   Posttraumatic stress disorder 12/24/2020    Paroxysmal atrial fibrillation (HCC) 12/24/2020   Essential tremor 12/24/2020   GAD (generalized anxiety disorder) 12/24/2020   Anemia 09/30/2020   Restless legs syndrome (RLS) 09/30/2020   Elevated liver enzymes 05/26/2020   Orthostatic hypotension 10/01/2018   Depression 06/01/2018   Fall 06/01/2018   Gastroesophageal reflux disease without esophagitis 12/13/2017   Insomnia 09/26/2017   CKD (chronic kidney disease) stage 3, GFR 30-59 ml/min ([place]) 09/14/2017   Pacemaker 05/25/2017   History of cardiac radiofrequency ablation (RFA) 12/18/2016   Sinus bradycardia 12/18/2016   Erectile dysfunction 02/12/2013   Essential hypertension 02/12/2013   Other and unspecified hyperlipidemia 02/12/2013   Presence of stent in LAD coronary artery 02/12/2013    Past Surgical History:  Procedure Laterality Date   APPENDECTOMY     CARDIAC SURGERY     CHOLECYSTECTOMY     HERNIA REPAIR     NASAL SINUS SURGERY     SHOULDER SURGERY         Family History  Problem Relation Age of Onset   Hypertension Mother    Stroke Mother    Stroke Father    Hypertension Father     Social History   Tobacco Use   Smoking status: Never    Passive exposure: Never   Smokeless tobacco: Never  Vaping Use   Vaping Use: Never used  Substance Use Topics   Alcohol use: Not Currently   Drug use: Never  Home Medications Prior to Admission medications   Medication Sig Start Date End Date Taking? Authorizing Provider  albuterol (VENTOLIN HFA) 108 (90 Base) MCG/ACT inhaler Inhale into the lungs.    [provider]  allopurinol (ZYLOPRIM) 100 MG tablet Take 100 mg by mouth daily. 09/22/20   [provider]  aspirin 81 MG EC tablet Take 1 tablet by mouth daily. 05/05/14   [provider]  atorvastatin (LIPITOR) 80 MG tablet Take 80 mg by mouth daily. 11/18/20   [provider]  clopidogrel (PLAVIX) 75 MG tablet Take 75 mg by mouth daily. 11/18/20   [provider]   escitalopram (LEXAPRO) 20 MG tablet Take 20 mg by mouth daily. 11/03/20   [provider]  ferrous sulfate 325 (65 FE) MG EC tablet Take by mouth. 10/02/20 12/31/20  [provider]  hydrOXYzine (VISTARIL) 50 MG capsule Take 1 capsule (50 mg total) by mouth 2 (two) times daily as needed for anxiety. 12/24/20   Luetta Nutting, DO  ketoconazole (NIZORAL) 2 % cream Apply topically 2 (two) times daily. 12/16/20   [provider]  latanoprost (XALATAN) 0.005 % ophthalmic solution 1 drop at bedtime. 12/07/20   [provider]  losartan (COZAAR) 25 MG tablet Take 25 mg by mouth daily. Take as need when blood pressure is over 160 09/26/20   [provider]  magnesium oxide (MAG-OX) 400 MG tablet Take by mouth.    [provider]  metoprolol tartrate (LOPRESSOR) 25 MG tablet Take 12.5 mg by mouth 2 (two) times daily. Take 1/2 tablet twice daily 11/18/20   [provider]  nitroGLYCERIN (NITROLINGUAL) 0.4 MG/SPRAY spray 1 SPRAY UNDER THE TONGUE AS DIRECTED 05/05/14   [provider]  pantoprazole (PROTONIX) 40 MG tablet Take 40 mg by mouth 2 (two) times daily. 11/18/20   [provider]  potassium citrate (UROCIT-K) 10 MEQ (1080 MG) SR tablet Take by mouth. 09/22/20   [provider]  primidone (MYSOLINE) 50 MG tablet Take 100 mg by mouth 2 (two) times daily. 09/26/20   [provider]  QUEtiapine (SEROQUEL) 50 MG tablet Take 100 mg by mouth at bedtime. Take 2 tabs at night 12/14/20   [provider]  ranolazine (RANEXA) 1000 MG SR tablet Take 1,000 mg by mouth 2 (two) times daily. 11/18/20   [provider]  rOPINIRole (REQUIP) 3 MG tablet Take 3 mg by mouth at bedtime. 10/13/20   [provider]    Allergies    Dilaudid [hydromorphone], Codeine, Penicillins, and Phenobarbital  Review of Systems   Review of Systems  Constitutional:  Negative for chills and fever.  HENT:  Negative for ear pain and  sore throat.   Eyes:  Negative for pain and visual disturbance.  Respiratory:  Negative for cough and shortness of breath.   Cardiovascular:  Positive for chest pain. Negative for palpitations.  Gastrointestinal:  Negative for abdominal pain and vomiting.  Genitourinary:  Negative for dysuria and hematuria.  Musculoskeletal:  Negative for arthralgias and back pain.  Skin:  Negative for color change and rash.  Neurological:  Negative for seizures, syncope and headaches.  All other systems reviewed and are negative.  Physical Exam Updated Vital Signs BP (!) 164/99   Pulse 77   Temp 98.5 F (36.9 C)   Resp 16   SpO2 100%   Physical Exam Vitals and nursing note reviewed.  Constitutional:      Appearance: Normal appearance.  HENT:  Head: Normocephalic and atraumatic.     Comments: Mildly tender over the right temporal scalp. No obvious skin findings. Eyes:     Extraocular Movements: Extraocular movements intact.     Pupils: Pupils are equal, round, and reactive to light.  Neck:     Comments: No midline cervical tenderness Cardiovascular:     Rate and Rhythm: Normal rate and regular rhythm.     Heart sounds: Normal heart sounds.  Pulmonary:     Effort: Pulmonary effort is normal.     Breath sounds: Normal breath sounds.  Abdominal:     General: There is no distension.  Musculoskeletal:     Cervical back: Normal range of motion.     Right lower leg: No edema.     Left lower leg: No edema.     Comments: No obvious deformity of the right shoulder.  He has diffuse tenderness to palpation about the shoulder.  Very limited range of motion.  Distal pulses are full.  Sensation is full.  Skin:    General: Skin is warm and dry.  Neurological:     General: No focal deficit present.     Mental Status: He is alert and oriented to person, place, and time.  Psychiatric:        Mood and Affect: Mood normal.        Behavior: Behavior normal.    ED Results / Procedures / Treatments    Labs (all labs ordered are listed, but only abnormal results are displayed) Labs Reviewed  CBC - Abnormal; Notable for the following components:      Result Value   RBC 4.00 (*)    Hemoglobin 12.4 (*)    HCT 38.3 (*)    All other components within normal limits  BASIC METABOLIC PANEL  TROPONIN I (HIGH SENSITIVITY)    EKG EKG Interpretation  Date/Time:  Sunday December 27 2020 18:29:08 EDT Ventricular Rate:  75 PR Interval:  230 QRS Duration: 92 QT Interval:  424 QTC Calculation: 473 R Axis:   14 Text Interpretation: Atrial-paced rhythm with prolonged AV conduction Nonspecific ST and T wave abnormality Abnormal ECG no acute ischemia similar to prior EKGs Confirmed by Lorre Munroe ([id number]) on 12/27/2020 11:18:23 PM  Radiology DG Chest 1 View  Result Date: 12/27/2020 CLINICAL DATA:  Status post slip and fall. EXAM: CHEST  1 VIEW COMPARISON:  December 06, 2020 FINDINGS: There is a dual lead AICD. Stable, diffuse, chronic appearing increased interstitial lung markings are noted. There is no evidence of acute infiltrate, pleural effusion or pneumothorax. The cardiac silhouette is mildly enlarged and unchanged in size. Degenerative changes are noted throughout the thoracic spine. IMPRESSION: Stable cardiomegaly without active cardiopulmonary disease. Electronically Signed   By: Virgina Norfolk M.D.   On: 12/27/2020 22:51   DG Shoulder Right  Result Date: 12/27/2020 CLINICAL DATA:  Fall and trauma to the right shoulder. EXAM: RIGHT SHOULDER - 2+ VIEW COMPARISON:  Right shoulder radiograph dated 12/16/2020 FINDINGS: There is no acute fracture or dislocation the bones are osteopenic. The soft tissues are unremarkable. IMPRESSION: No acute fracture or dislocation. Electronically Signed   By: Anner Crete M.D.   On: 12/27/2020 22:50   CT Head Wo Contrast  Result Date: 12/27/2020 CLINICAL DATA:  Status post fall. EXAM: CT HEAD WITHOUT CONTRAST TECHNIQUE: Contiguous axial images were obtained  from the base of the skull through the vertex without intravenous contrast. COMPARISON:  November 09, 2020 FINDINGS: Brain: There is mild cerebral atrophy with  widening of the extra-axial spaces and ventricular dilatation. There are areas of decreased attenuation within the white matter tracts of the supratentorial brain, consistent with microvascular disease changes. Areas of bilateral frontal lobe encephalomalacia, and adjacent chronic white matter low attenuation, are again noted. Vascular: No hyperdense vessel or unexpected calcification. Skull: A frontal craniotomy defect is seen along the midline. Sinuses/Orbits: Marked severity bilateral maxillary sinus, left ethmoid sinus and bilateral nasal mucosal thickening is seen. Other: None. IMPRESSION: 1. Stable postoperative changes consistent with prior bifrontal craniotomy with areas of bilateral frontal lobe encephalomalacia. 2. No acute intracranial abnormality. 3. Marked severity pansinus disease. Electronically Signed   By: Virgina Norfolk M.D.   On: 12/27/2020 22:41   CT Cervical Spine Wo Contrast  Result Date: 12/27/2020 CLINICAL DATA:  Status post slip and fall. EXAM: CT CERVICAL SPINE WITHOUT CONTRAST TECHNIQUE: Multidetector CT imaging of the cervical spine was performed without intravenous contrast. Multiplanar CT image reconstructions were also generated. COMPARISON:  None. FINDINGS: Alignment: Normal. Skull base and vertebrae: Chronic loss of vertebral body height is seen involving the C5 and C6 vertebral bodies. No primary bone lesion or focal pathologic process. Soft tissues and spinal canal: No prevertebral fluid or swelling. No visible canal hematoma. Disc levels: Moderate to marked severity endplate sclerosis and anterior osteophyte formation is seen at the levels of C4-C5, C5-C6, C6-C7 and C7-T1. Marked severity intervertebral disc space narrowing is seen at the level of C7-T1. Mild to moderate severity multilevel intervertebral disc space  narrowing is seen throughout the remainder of the cervical spine. Bilateral moderate to marked severity multilevel facet joint hypertrophy is noted. Upper chest: Negative. Other: None. IMPRESSION: 1. Chronic appearing loss of vertebral body height involving the C5 and C6 vertebral bodies. MRI correlation is recommended if acute cervical spine fracture remains of clinical concern. 2. Moderate to marked severity multilevel degenerative changes, most prominent at the levels of C4-C5, C5-C6, C6-C7 and C7-T1. Electronically Signed   By: Virgina Norfolk M.D.   On: 12/27/2020 22:45    Procedures Procedures   Medications Ordered in ED Medications  fentaNYL (SUBLIMAZE) injection 25 mcg (has no administration in time range)    ED Course  I have reviewed the triage vital signs and the nursing notes.  Pertinent labs & imaging results that were available during my care of the patient were reviewed by me and considered in my medical decision making (see chart for details).    MDM Rules/Calculators/A&P                           Mark Thomas presents after a fall.  He has numerous reasons to fall, and he is generally well-appearing at the moment.  He was evaluated for evidence of trauma.  He did have some right-sided chest pain which I think is related to his fall, but labs and cardiac enzyme were ordered due to his past medical history of coronary artery disease.  At this point, trauma studies are within normal limits.  Troponin and other labs are pending.  ED work-up is negative.  I have an extremely low suspicion for a cardiac cause of his fall, and only 1 troponin was ordered.  He will be discharged home.  At discharge, the patient had urinated, and it appeared dark or even bloody. D/C held for urinalysis. Still no abdominal pain or flank pain. Final Clinical Impression(s) / ED Diagnoses Final diagnoses:  Fall, initial encounter  Long term (current) use  of anticoagulants  Injury of head,  initial encounter  Acute pain of right shoulder    Rx / DC Orders ED Discharge Orders     None        Arnaldo Natal, MD 12/27/20 2320    Arnaldo Natal, MD 12/27/20 2505    Arnaldo Natal, MD 12/27/20 [phone]

## 2020-12-27 NOTE — ED Notes (Signed)
Patients states he fell in the bathroom after getting off of toilet. Did not have his walker with him. He stated hit his head on the right side. No bump felt. No bruising or swelling noted.  Has had multiple falls and has torn his right shoulder and he see surgeon on Wed.  States has blue tooth pacemaker.

## 2020-12-27 NOTE — ED Notes (Signed)
Noted patient to be in Atrial pacing 1:1. States pain is an 8 located at right shoulder head. Talkative wife states has vascular dementia and forgets very easy. Denies blurred vision. Warm blanket provided for comfort.

## 2020-12-27 NOTE — ED Triage Notes (Signed)
He states he slipped on a wet floor in his home, striking his right shoulder/head/neck area. He states that after this fall, he experienced some chest pain. He is in no distress.

## 2020-12-28 DIAGNOSIS — R079 Chest pain, unspecified: Secondary | ICD-10-CM | POA: Diagnosis not present

## 2020-12-28 LAB — URINALYSIS, MICROSCOPIC (REFLEX)
RBC / HPF: >50 RBC/hpf (ref 0–5)
Squamous Epithelial / HPF: NONE SEEN (ref 0–5)

## 2020-12-28 LAB — URINALYSIS, ROUTINE W REFLEX MICROSCOPIC
Bilirubin Urine: NEGATIVE
Glucose, UA: NEGATIVE mg/dL
Ketones, ur: NEGATIVE mg/dL
Leukocytes,Ua: NEGATIVE
Nitrite: NEGATIVE
Protein, ur: 30 mg/dL — AB
Specific Gravity, Urine: 1.023 (ref 1.005–1.030)
pH: 5.5 (ref 5.0–8.0)

## 2020-12-28 NOTE — ED Notes (Signed)
Pt verbalizes understanding of discharge instructions. Opportunity for questioning and answers were provided. Armand removed by staff, pt discharged from ED to home.

## 2020-12-29 ENCOUNTER — Ambulatory Visit: Payer: TRICARE For Life (TFL) | Admitting: Orthopaedic Surgery

## 2020-12-29 ENCOUNTER — Encounter: Payer: Self-pay | Admitting: Family Medicine

## 2020-12-30 DIAGNOSIS — M12811 Other specific arthropathies, not elsewhere classified, right shoulder: Secondary | ICD-10-CM | POA: Insufficient documentation

## 2021-01-06 ENCOUNTER — Telehealth: Payer: Self-pay

## 2021-01-06 NOTE — Telephone Encounter (Signed)
Pts wife Mark Thomas (on [place] form) called and stated that Mark Thomas was referred to psych and the referral was sent to [facility] and they are not affiliated with [facility]. They are wanting a new referral to a [facility].   Per Dr. Zigmund Daniel, he was referred to geriatric psych and there is not a geriatric psych within the [facility] network. She would like to get a referral to Dr. De Nurse or any other psych in order to keep him within the [facility] system. She is wanting to make sure that everybody within the same healthcare organization is able to completely follow medical records without any "falling through the cracks". She is also concerned regarding his medications, possible addictions, falls, and weaning off meds.   Pt has said that he wants one doctor taking care of all of his meds.   She said if they have no choice but to stick with [facility] they will, but they really want it all in [place]

## 2021-01-07 ENCOUNTER — Emergency Department (HOSPITAL_BASED_OUTPATIENT_CLINIC_OR_DEPARTMENT_OTHER): Payer: Medicare Other

## 2021-01-07 ENCOUNTER — Encounter (HOSPITAL_BASED_OUTPATIENT_CLINIC_OR_DEPARTMENT_OTHER): Payer: Self-pay | Admitting: *Deleted

## 2021-01-07 ENCOUNTER — Other Ambulatory Visit: Payer: Self-pay

## 2021-01-07 ENCOUNTER — Inpatient Hospital Stay (HOSPITAL_BASED_OUTPATIENT_CLINIC_OR_DEPARTMENT_OTHER)
Admission: EM | Admit: 2021-01-07 | Discharge: 2021-01-09 | DRG: 069 | Disposition: A | Discharge status: Home / Self Care | Payer: Medicare Other | Attending: Internal Medicine | Admitting: Internal Medicine

## 2021-01-07 DIAGNOSIS — I2510 Atherosclerotic heart disease of native coronary artery without angina pectoris: Secondary | ICD-10-CM | POA: Diagnosis present

## 2021-01-07 DIAGNOSIS — F419 Anxiety disorder, unspecified: Secondary | ICD-10-CM | POA: Diagnosis not present

## 2021-01-07 DIAGNOSIS — I129 Hypertensive chronic kidney disease with stage 1 through stage 4 chronic kidney disease, or unspecified chronic kidney disease: Secondary | ICD-10-CM | POA: Diagnosis present

## 2021-01-07 DIAGNOSIS — E7800 Pure hypercholesterolemia, unspecified: Secondary | ICD-10-CM | POA: Diagnosis not present

## 2021-01-07 DIAGNOSIS — H538 Other visual disturbances: Secondary | ICD-10-CM | POA: Diagnosis present

## 2021-01-07 DIAGNOSIS — G459 Transient cerebral ischemic attack, unspecified: Principal | ICD-10-CM | POA: Diagnosis present

## 2021-01-07 DIAGNOSIS — I252 Old myocardial infarction: Secondary | ICD-10-CM

## 2021-01-07 DIAGNOSIS — Z20822 Contact with and (suspected) exposure to covid-19: Secondary | ICD-10-CM | POA: Diagnosis present

## 2021-01-07 DIAGNOSIS — K219 Gastro-esophageal reflux disease without esophagitis: Secondary | ICD-10-CM | POA: Diagnosis present

## 2021-01-07 DIAGNOSIS — N183 Chronic kidney disease, stage 3 unspecified: Secondary | ICD-10-CM | POA: Diagnosis present

## 2021-01-07 DIAGNOSIS — R296 Repeated falls: Secondary | ICD-10-CM | POA: Diagnosis not present

## 2021-01-07 DIAGNOSIS — I69351 Hemiplegia and hemiparesis following cerebral infarction affecting right dominant side: Secondary | ICD-10-CM

## 2021-01-07 DIAGNOSIS — Z79899 Other long term (current) drug therapy: Secondary | ICD-10-CM

## 2021-01-07 DIAGNOSIS — Z7902 Long term (current) use of antithrombotics/antiplatelets: Secondary | ICD-10-CM

## 2021-01-07 DIAGNOSIS — Z880 Allergy status to penicillin: Secondary | ICD-10-CM

## 2021-01-07 DIAGNOSIS — I480 Paroxysmal atrial fibrillation: Secondary | ICD-10-CM | POA: Diagnosis present

## 2021-01-07 DIAGNOSIS — G250 Essential tremor: Secondary | ICD-10-CM | POA: Diagnosis present

## 2021-01-07 DIAGNOSIS — G9389 Other specified disorders of brain: Secondary | ICD-10-CM | POA: Diagnosis not present

## 2021-01-07 DIAGNOSIS — Z823 Family history of stroke: Secondary | ICD-10-CM

## 2021-01-07 DIAGNOSIS — I350 Nonrheumatic aortic (valve) stenosis: Secondary | ICD-10-CM | POA: Diagnosis not present

## 2021-01-07 DIAGNOSIS — Z8249 Family history of ischemic heart disease and other diseases of the circulatory system: Secondary | ICD-10-CM | POA: Diagnosis not present

## 2021-01-07 DIAGNOSIS — F0150 Vascular dementia without behavioral disturbance: Secondary | ICD-10-CM | POA: Diagnosis not present

## 2021-01-07 DIAGNOSIS — Z885 Allergy status to narcotic agent status: Secondary | ICD-10-CM

## 2021-01-07 DIAGNOSIS — F32A Depression, unspecified: Secondary | ICD-10-CM | POA: Diagnosis present

## 2021-01-07 DIAGNOSIS — Z8774 Personal history of (corrected) congenital malformations of heart and circulatory system: Secondary | ICD-10-CM | POA: Diagnosis not present

## 2021-01-07 DIAGNOSIS — I951 Orthostatic hypotension: Secondary | ICD-10-CM | POA: Diagnosis present

## 2021-01-07 DIAGNOSIS — G2581 Restless legs syndrome: Secondary | ICD-10-CM | POA: Diagnosis not present

## 2021-01-07 DIAGNOSIS — Z950 Presence of cardiac pacemaker: Secondary | ICD-10-CM | POA: Diagnosis present

## 2021-01-07 DIAGNOSIS — F1021 Alcohol dependence, in remission: Secondary | ICD-10-CM | POA: Diagnosis not present

## 2021-01-07 DIAGNOSIS — I10 Essential (primary) hypertension: Secondary | ICD-10-CM | POA: Diagnosis present

## 2021-01-07 DIAGNOSIS — Z955 Presence of coronary angioplasty implant and graft: Secondary | ICD-10-CM

## 2021-01-07 DIAGNOSIS — R531 Weakness: Secondary | ICD-10-CM | POA: Diagnosis present

## 2021-01-07 DIAGNOSIS — R29706 NIHSS score 6: Secondary | ICD-10-CM | POA: Diagnosis present

## 2021-01-07 DIAGNOSIS — Z888 Allergy status to other drugs, medicaments and biological substances status: Secondary | ICD-10-CM

## 2021-01-07 DIAGNOSIS — Z7982 Long term (current) use of aspirin: Secondary | ICD-10-CM

## 2021-01-07 LAB — DIFFERENTIAL
Abs Immature Granulocytes: 0.03 10*3/uL (ref 0.00–0.07)
Basophils Absolute: 0 10*3/uL (ref 0.0–0.1)
Basophils Relative: 1 %
Eosinophils Absolute: 0.2 10*3/uL (ref 0.0–0.5)
Eosinophils Relative: 3 %
Immature Granulocytes: 0 %
Lymphocytes Relative: 27 %
Lymphs Abs: 1.8 10*3/uL (ref 0.7–4.0)
Monocytes Absolute: 0.7 10*3/uL (ref 0.1–1.0)
Monocytes Relative: 11 %
Neutro Abs: 4 10*3/uL (ref 1.7–7.7)
Neutrophils Relative %: 58 %

## 2021-01-07 LAB — URINALYSIS, ROUTINE W REFLEX MICROSCOPIC
Bilirubin Urine: NEGATIVE
Glucose, UA: NEGATIVE mg/dL
Hgb urine dipstick: NEGATIVE
Ketones, ur: NEGATIVE mg/dL
Nitrite: NEGATIVE
Specific Gravity, Urine: >1.046 — ABNORMAL HIGH (ref 1.005–1.030)
pH: 7 (ref 5.0–8.0)

## 2021-01-07 LAB — COMPREHENSIVE METABOLIC PANEL
ALT: 31 U/L (ref 0–44)
AST: 37 U/L (ref 15–41)
Albumin: 4.2 g/dL (ref 3.5–5.0)
Alkaline Phosphatase: 60 U/L (ref 38–126)
Anion gap: 10 (ref 5–15)
BUN: 27 mg/dL — ABNORMAL HIGH (ref 8–23)
CO2: 25 mmol/L (ref 22–32)
Calcium: 9.5 mg/dL (ref 8.9–10.3)
Chloride: 99 mmol/L (ref 98–111)
Creatinine, Ser: 1.25 mg/dL — ABNORMAL HIGH (ref 0.61–1.24)
GFR, Estimated: >60 mL/min (ref >60)
Glucose, Bld: 117 mg/dL — ABNORMAL HIGH (ref 70–99)
Potassium: 4.9 mmol/L (ref 3.5–5.1)
Sodium: 134 mmol/L — ABNORMAL LOW (ref 135–145)
Total Bilirubin: 0.5 mg/dL (ref 0.3–1.2)
Total Protein: 7.2 g/dL (ref 6.5–8.1)

## 2021-01-07 LAB — RAPID URINE DRUG SCREEN, HOSP PERFORMED
Amphetamines: NOT DETECTED
Barbiturates: POSITIVE — AB
Benzodiazepines: NOT DETECTED
Cocaine: NOT DETECTED
Opiates: NOT DETECTED
Tetrahydrocannabinol: NOT DETECTED

## 2021-01-07 LAB — PROTIME-INR
INR: 1 (ref 0.8–1.2)
Prothrombin Time: 13.2 seconds (ref 11.4–15.2)

## 2021-01-07 LAB — RESP PANEL BY RT-PCR (FLU A&B, COVID) ARPGX2
Influenza A by PCR: NEGATIVE
Influenza B by PCR: NEGATIVE
SARS Coronavirus 2 by RT PCR: NEGATIVE

## 2021-01-07 LAB — CBC
HCT: 40.8 % (ref 39.0–52.0)
Hemoglobin: 13.5 g/dL (ref 13.0–17.0)
MCH: 31.5 pg (ref 26.0–34.0)
MCHC: 33.1 g/dL (ref 30.0–36.0)
MCV: 95.3 fL (ref 80.0–100.0)
Platelets: 206 10*3/uL (ref 150–400)
RBC: 4.28 MIL/uL (ref 4.22–5.81)
RDW: 14 % (ref 11.5–15.5)
WBC: 6.9 10*3/uL (ref 4.0–10.5)
nRBC: 0 % (ref 0.0–0.2)

## 2021-01-07 LAB — ETHANOL: Alcohol, Ethyl (B): 11 mg/dL — ABNORMAL HIGH (ref <10)

## 2021-01-07 LAB — APTT: aPTT: 26 seconds (ref 24–36)

## 2021-01-07 IMAGING — CT CT ANGIO HEAD-NECK (W OR W/O PERF)
1 series · 1 of 1 positions shown · IV contrast (agent unspecified)
Comparison: Noncontrast head CT performed earlier today [DATE].

CLINICAL DATA: Stroke code. Additional history provided by ER
physician: Right-sided deficits.

EXAM:
CT ANGIOGRAPHY HEAD AND NECK
TECHNIQUE: Multidetector CT imaging of the head and neck was performed using
the standard protocol during bolus administration of intravenous
contrast. Multiplanar CT image reconstructions and MIPs were
obtained to evaluate the vascular anatomy. Carotid stenosis
measurements (when applicable) are obtained utilizing NASCET
criteria, using the distal internal carotid diameter as the
denominator.
CONTRAST:  Administered contrast not known at this time.

[Series 1: topogram 1.0 tr20 · sagittal · 1.00mm/px · 1 of 1 slices shown]
[im 1/1]
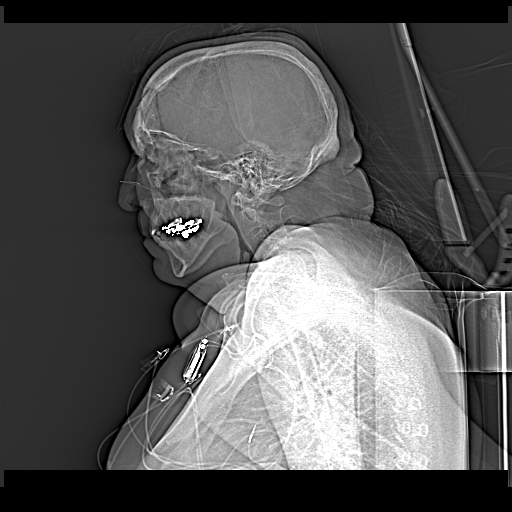

[1 of 1 positions shown; findings below may reference images not displayed]

FINDINGS: CTA NECK FINDINGS

Aortic arch: Standard aortic branching. Atherosclerotic plaque
within the visualized aortic arch and proximal major branch vessels
of the neck. No hemodynamically significant innominate or proximal
subclavian artery stenosis.

Right carotid system: CCA and ICA patent within the neck. Calcified
plaque within the carotid bifurcation and proximal ICA.
Quantification of stenosis within the proximal right ICA is
difficult due to irregularity of calcified plaque at this site.
However, stenosis is estimated at up to 50%.

Left carotid system: CCA and ICA patent within the neck without
significant stenosis (50% or greater). Soft and calcified plaque
within the carotid bifurcation and proximal ICA.

Vertebral arteries: Vertebral arteries codominant and patent within
the neck without hemodynamically significant stenosis.

Skeleton: Cervical spondylosis. No acute bony abnormality or
aggressive osseous lesion.

Other neck: No neck mass or cervical lymphadenopathy.

Upper chest: No consolidation within the imaged lung apices.

Review of the MIP images confirms the above findings

CTA HEAD FINDINGS

Anterior circulation:

The intracranial internal carotid arteries are patent. Calcified
plaque within both vessels with no more than mild stenosis. The M1
middle cerebral arteries are patent. No M2 proximal branch occlusion
is identified. Atherosclerotic irregularity of the M2 and more
distal MCA vessels bilaterally. Most notably, there are multifocal
severe stenoses within a mid M2 right MCA vessel and a severe
stenosis within a distal M2 right MCA vessel. The anterior cerebral
arteries are patent. No intracranial aneurysm is identified.

Posterior circulation:

The intracranial vertebral arteries are patent. Calcified plaque
within the proximal V4 left vertebral artery with moderate stenosis.
The basilar artery is patent. The posterior cerebral arteries are
patent. Posterior communicating arteries are hypoplastic or absent
bilaterally.

Venous sinuses: Within the limitations of contrast timing, no
convincing thrombus.

Anatomic variants: As described

Review of the MIP images confirms the above findings

No intracranial large vessel occlusion identified. These results
were called by telephone at the time of interpretation on [DATE]
at [DATE] to provider Dr. PADAM, who verbally acknowledged these
results.
IMPRESSION: CTA neck:

1. The common carotid and internal carotid arteries are patent
within the neck. Estimation of stenosis within the proximal right
ICA is difficult due to irregularity of calcified plaque at this
site, but is estimated at up to 50% stenosis. Mild soft and
calcified plaque within the left carotid bifurcation and proximal
left ICA with less than 50% stenosis at this site.
2. Vertebral arteries patent within the neck without hemodynamically
significant stenosis.

CTA head:

1. No intracranial large vessel occlusion identified.
2. Intracranial atherosclerotic disease with multifocal stenoses,
most notably as follows.
3. Multifocal severe stenoses within a mid M2 right MCA vessel.
4. Severe stenosis within a distal M2 right MCA vessel.
5. Atherosclerotic plaque within the intracranial internal carotid
arteries with no more than mild stenosis.
6. Moderate atherosclerotic narrowing of the proximal V4 left
vertebral artery.

## 2021-01-07 IMAGING — CT CT ANGIO HEAD-NECK (W OR W/O PERF)
3 of 8 series · 10 of 36 positions shown · IV contrast (APPLIED)
Comparison: Noncontrast head CT performed earlier today [DATE].

CLINICAL DATA: Stroke code. Additional history provided by ER
physician: Right-sided deficits.

EXAM:
CT ANGIOGRAPHY HEAD AND NECK
TECHNIQUE: Multidetector CT imaging of the head and neck was performed using
the standard protocol during bolus administration of intravenous
contrast. Multiplanar CT image reconstructions and MIPs were
obtained to evaluate the vascular anatomy. Carotid stenosis
measurements (when applicable) are obtained utilizing NASCET
criteria, using the distal internal carotid diameter as the
denominator.
CONTRAST:  Administered contrast not known at this time.

[Series 3: cta head & neck · axial · 0.56mm/px · z∈[-356,-66]mm · 6 of 818 slices shown]
[im 117/818  soft-tissue]
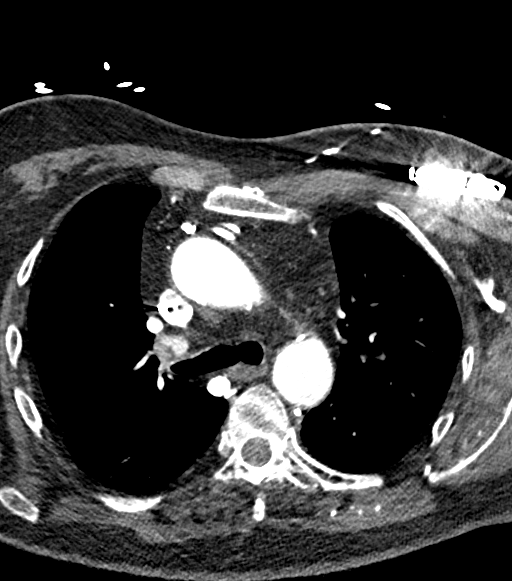
[im 234/818  bone]
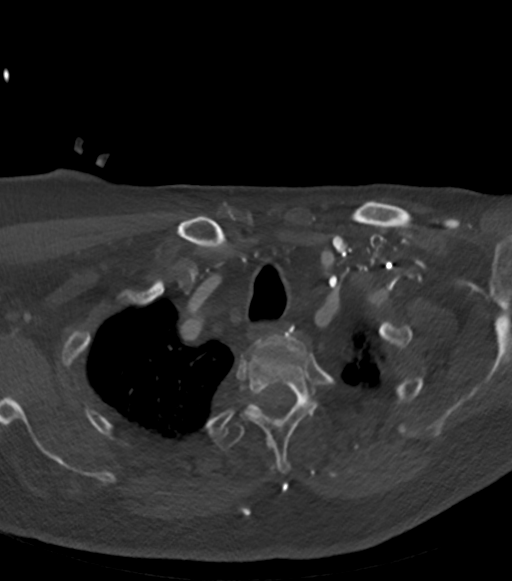
[im 351/818  soft-tissue]
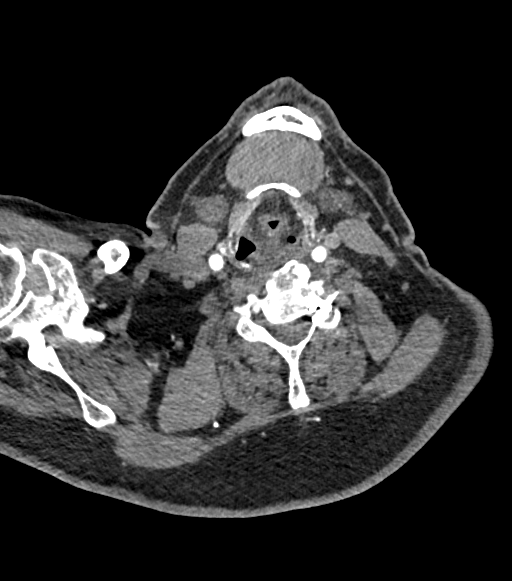
[im 467/818  bone]
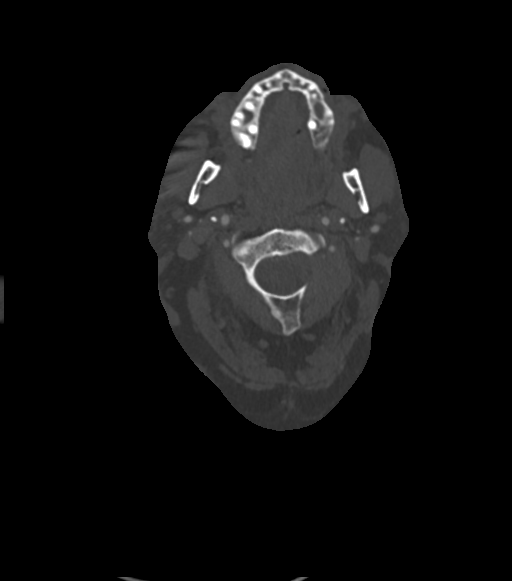
[im 584/818  soft-tissue]
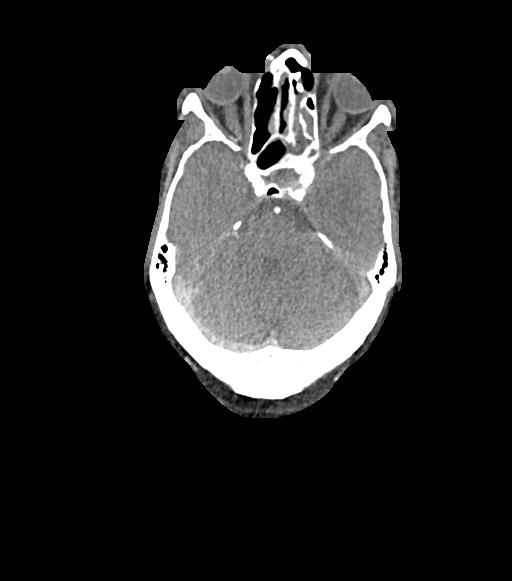
[im 701/818  bone]
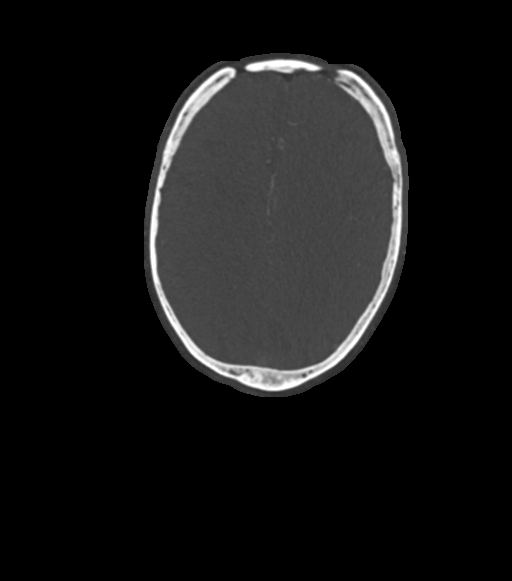

[Series 4: ax thin · axial · 0.50mm/px · z∈[-281,-145]mm · 2 of 415 slices shown]
[im 139/415  soft-tissue]
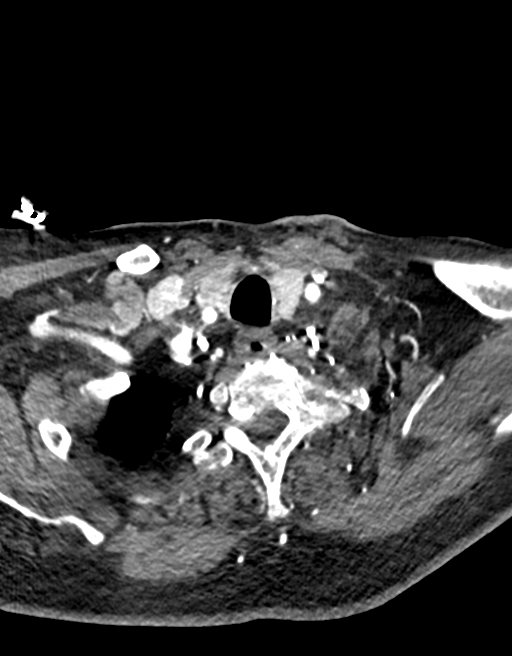
[im 277/415  soft-tissue]
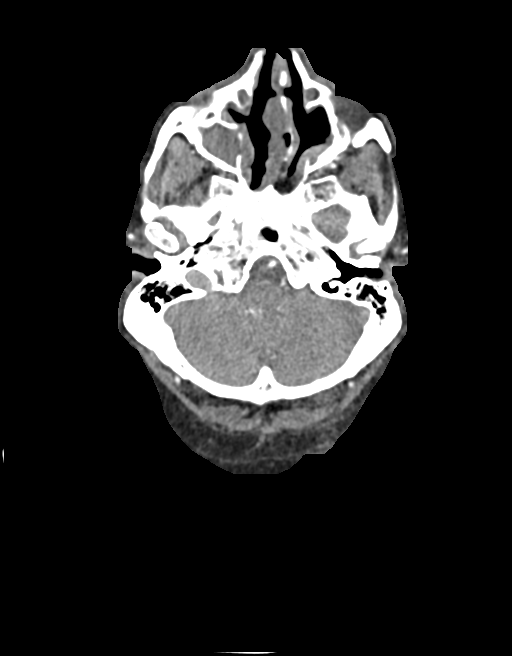

[Series 6: sag thin · sagittal · 0.64mm/px · 2 of 314 slices shown]
[im 83/314  soft-tissue]
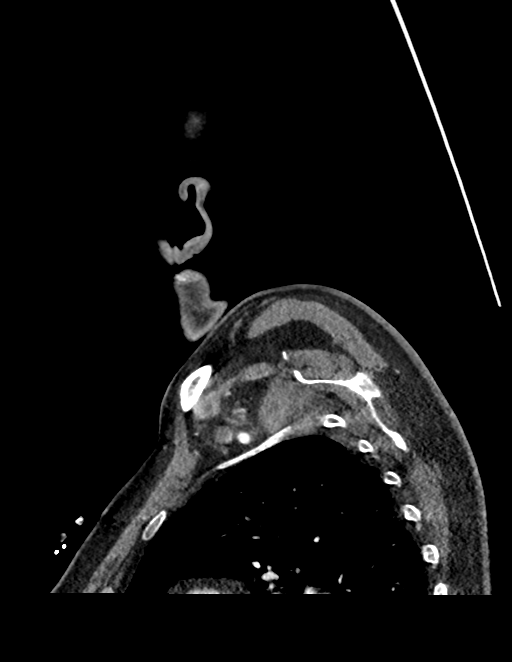
[im 167/314  soft-tissue]
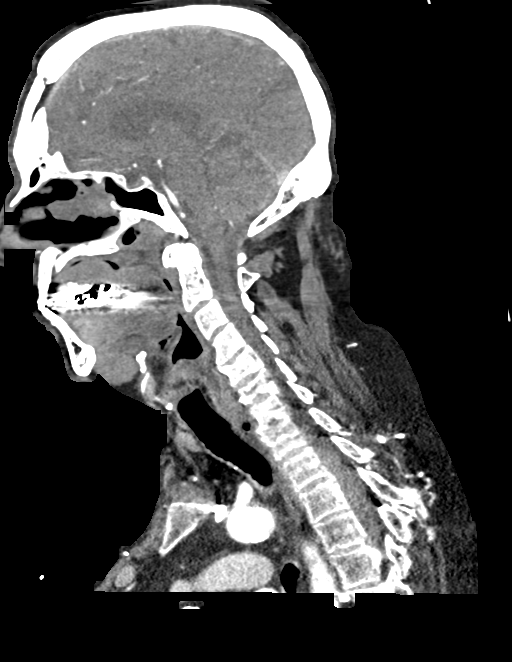

[10 of 36 positions shown; findings below may reference images not displayed]

FINDINGS: CTA NECK FINDINGS

Aortic arch: Standard aortic branching. Atherosclerotic plaque
within the visualized aortic arch and proximal major branch vessels
of the neck. No hemodynamically significant innominate or proximal
subclavian artery stenosis.

Right carotid system: CCA and ICA patent within the neck. Calcified
plaque within the carotid bifurcation and proximal ICA.
Quantification of stenosis within the proximal right ICA is
difficult due to irregularity of calcified plaque at this site.
However, stenosis is estimated at up to 50%.

Left carotid system: CCA and ICA patent within the neck without
significant stenosis (50% or greater). Soft and calcified plaque
within the carotid bifurcation and proximal ICA.

Vertebral arteries: Vertebral arteries codominant and patent within
the neck without hemodynamically significant stenosis.

Skeleton: Cervical spondylosis. No acute bony abnormality or
aggressive osseous lesion.

Other neck: No neck mass or cervical lymphadenopathy.

Upper chest: No consolidation within the imaged lung apices.

Review of the MIP images confirms the above findings

CTA HEAD FINDINGS

Anterior circulation:

The intracranial internal carotid arteries are patent. Calcified
plaque within both vessels with no more than mild stenosis. The M1
middle cerebral arteries are patent. No M2 proximal branch occlusion
is identified. Atherosclerotic irregularity of the M2 and more
distal MCA vessels bilaterally. Most notably, there are multifocal
severe stenoses within a mid M2 right MCA vessel and a severe
stenosis within a distal M2 right MCA vessel. The anterior cerebral
arteries are patent. No intracranial aneurysm is identified.

Posterior circulation:

The intracranial vertebral arteries are patent. Calcified plaque
within the proximal V4 left vertebral artery with moderate stenosis.
The basilar artery is patent. The posterior cerebral arteries are
patent. Posterior communicating arteries are hypoplastic or absent
bilaterally.

Venous sinuses: Within the limitations of contrast timing, no
convincing thrombus.

Anatomic variants: As described

Review of the MIP images confirms the above findings

No intracranial large vessel occlusion identified. These results
were called by telephone at the time of interpretation on [DATE]
at [DATE] to provider Dr. PADAM, who verbally acknowledged these
results.
IMPRESSION: CTA neck:

1. The common carotid and internal carotid arteries are patent
within the neck. Estimation of stenosis within the proximal right
ICA is difficult due to irregularity of calcified plaque at this
site, but is estimated at up to 50% stenosis. Mild soft and
calcified plaque within the left carotid bifurcation and proximal
left ICA with less than 50% stenosis at this site.
2. Vertebral arteries patent within the neck without hemodynamically
significant stenosis.

CTA head:

1. No intracranial large vessel occlusion identified.
2. Intracranial atherosclerotic disease with multifocal stenoses,
most notably as follows.
3. Multifocal severe stenoses within a mid M2 right MCA vessel.
4. Severe stenosis within a distal M2 right MCA vessel.
5. Atherosclerotic plaque within the intracranial internal carotid
arteries with no more than mild stenosis.
6. Moderate atherosclerotic narrowing of the proximal V4 left
vertebral artery.

## 2021-01-07 IMAGING — CT CT HEAD CODE STROKE
3 of 4 series · 14 of 47 positions shown, 16 images · non-contrast
Comparison: Noncontrast head CT [DATE].

CLINICAL DATA: Code stroke. Neuro deficit, acute, stroke suspected.

EXAM:
CT HEAD WITHOUT CONTRAST
TECHNIQUE: Contiguous axial images were obtained from the base of the skull
through the vertex without intravenous contrast.

[Series 4: head wo · axial · 0.58mm/px · z∈[-122,+48]mm · 8 of 40 slices shown, 10 images]
[im 3/40  brain]
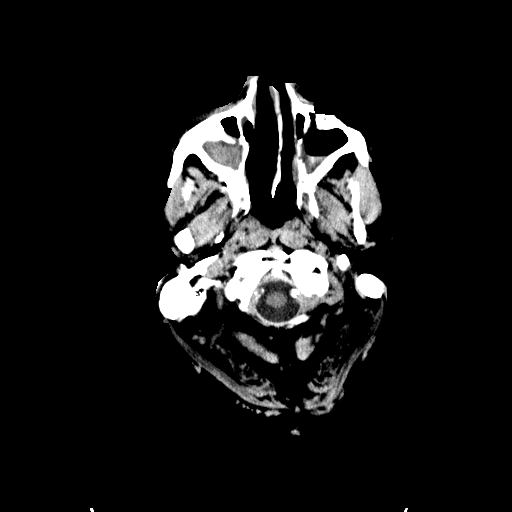
[im 3/40  bone]
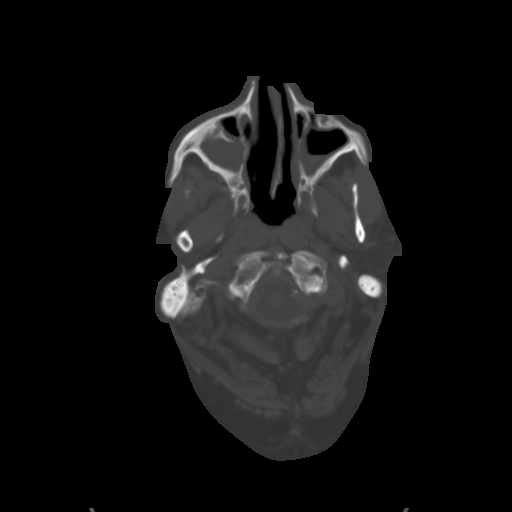
[im 9/40  brain]
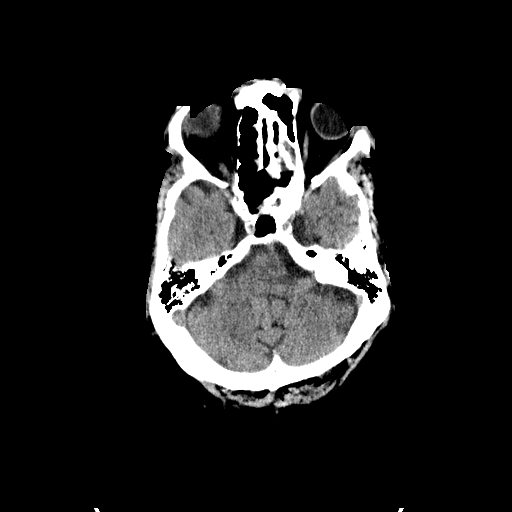
[im 14/40  brain]
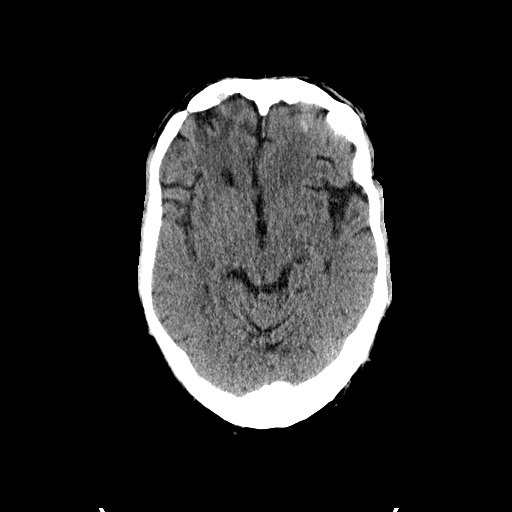
[im 17/40  brain]
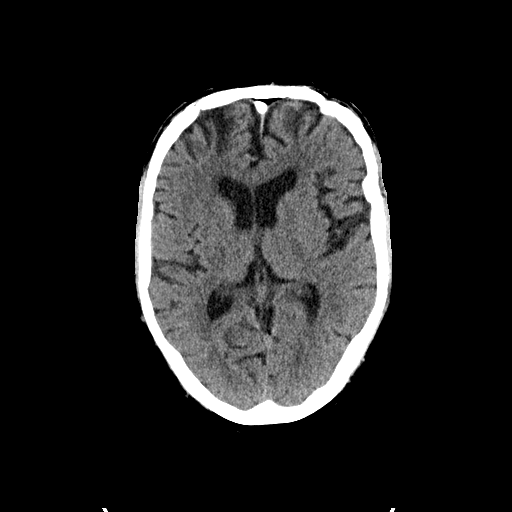
[im 23/40  brain]
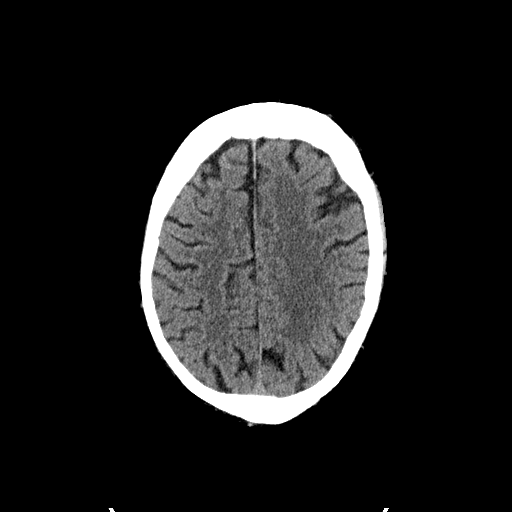
[im 23/40  bone]
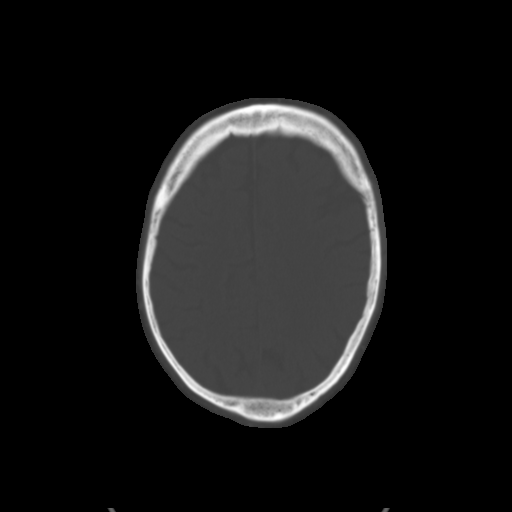
[im 26/40  brain]
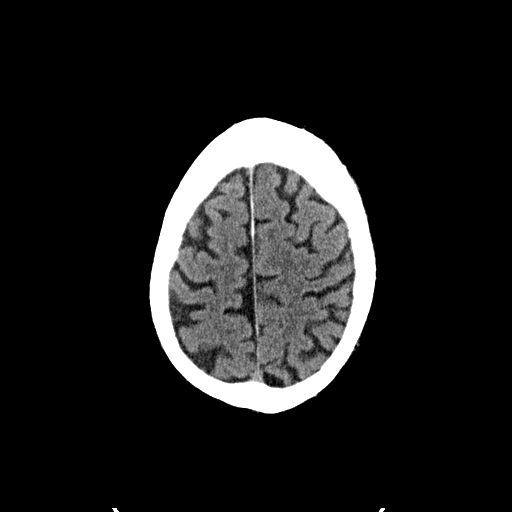
[im 31/40  brain]
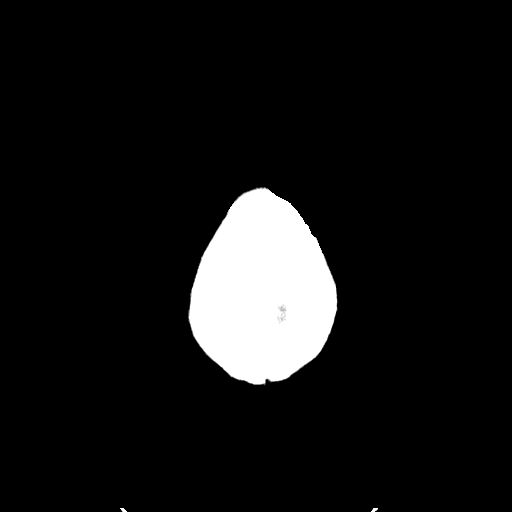
[im 37/40  brain]
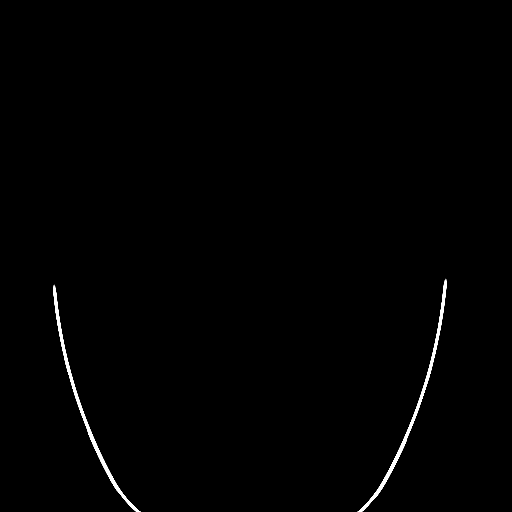

[Series 6: coronal soft · coronal · 0.37mm/px · 3 of 82 slices shown]
[im 28/82  brain]
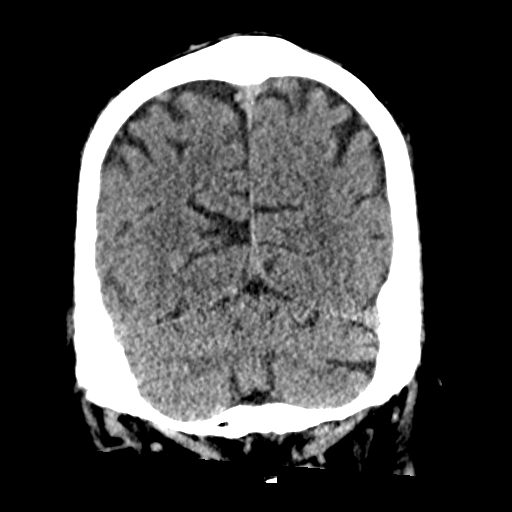
[im 37/82  brain]
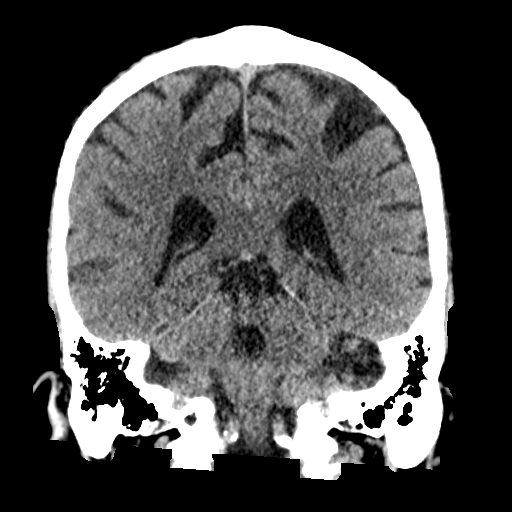
[im 46/82  brain]
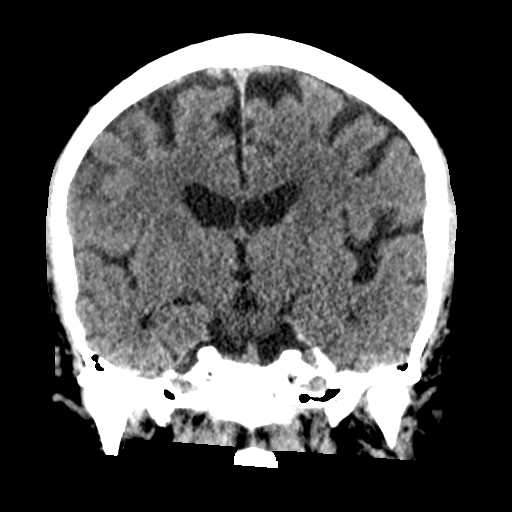

[Series 7: sagittal soft · sagittal · 0.37mm/px · 3 of 61 slices shown]
[im 21/61  brain]
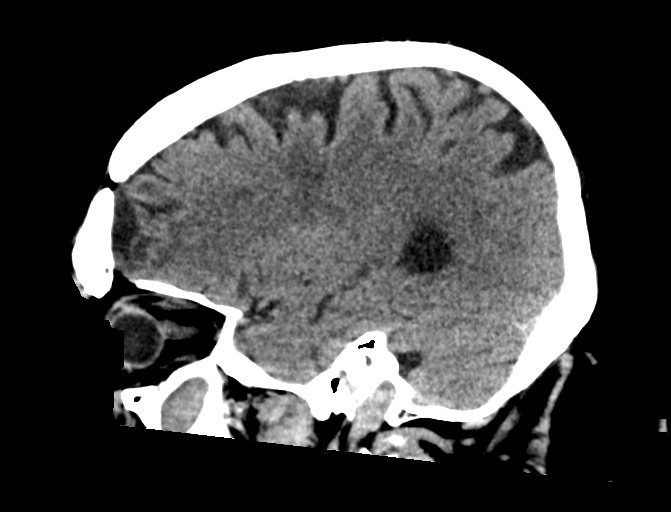
[im 31/61  brain]
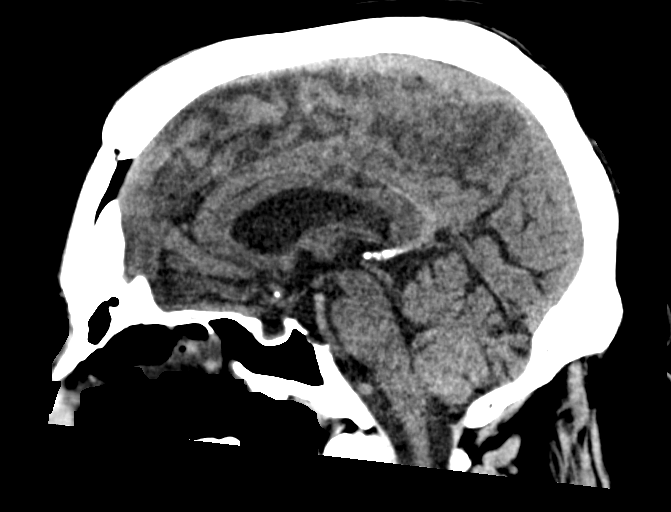
[im 41/61  brain]
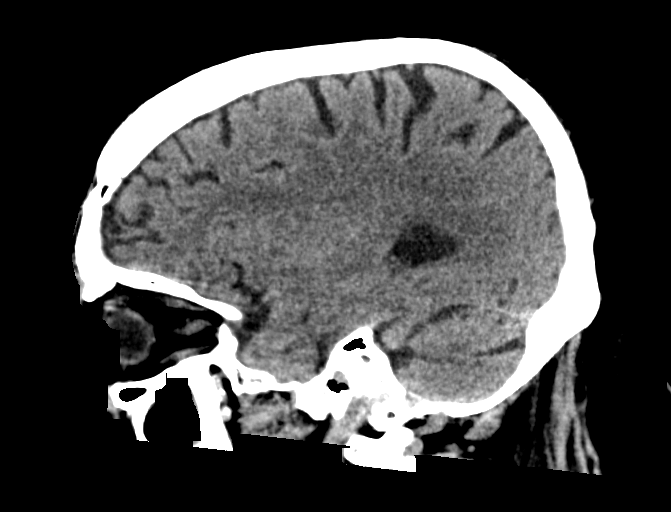

[14 of 47 positions shown; findings below may reference images not displayed]

FINDINGS: Brain:

Redemonstrated foci of chronic encephalomalacia within the anterior
frontal lobes bilaterally.

Background mild generalized cerebral and cerebellar atrophy.

Unchanged focus of chronic small-vessel ischemia within the right
corona radiata (for instance as seen on series 4, image 21).

Background patchy and ill-defined hypoattenuation within the
cerebral white matter, nonspecific but compatible with chronic small
vessel ischemic disease.

Redemonstrated chronic lacunar infarct within the left caudate
nucleus.

There is no acute intracranial hemorrhage.

No demarcated cortical infarct.

No extra-axial fluid collection.

No evidence of an intracranial mass.

No midline shift.

Vascular: No hyperdense vessel.  Atherosclerotic calcifications.

Skull: Bifrontal cranioplasty.

Sinuses/Orbits: Visualized orbits show no acute finding.
Postsurgical appearance of the paranasal sinuses. Paranasal sinus
disease. Most notably, there is extensive opacification of the
posterior left ethmoid air cells, near complete opacification of the
right maxillary sinus and moderate mucosal thickening within the
left maxillary sinus.

Other: Bilateral mastoid effusions.

ASPECTS ([place] Stroke Program Early CT Score)

- Ganglionic level infarction (caudate, lentiform nuclei, internal
capsule, insula, M1-M3 cortex): 7

- Supraganglionic infarction (M4-M6 cortex): 3

Total score (0-10 with 10 being normal): 10 (when discounting
chronic infarcts).

These results were called by telephone at the time of interpretation
on [DATE] at [DATE] to provider POE , who verbally
acknowledged these results.
IMPRESSION: No evidence of acute intracranial abnormality.

Redemonstrated chronic encephalomalacia within the bilateral frontal
lobes, likely posttraumatic.

Unchanged focus of chronic small-vessel ischemia within the right
frontal lobe white matter.

Background chronic small-vessel ischemic changes within the cerebral
white matter.

Redemonstrated chronic left basal ganglia lacunar infarct.

Mild generalized parenchymal atrophy.

Paranasal sinus disease, as described.

Bilateral mastoid effusions.

## 2021-01-07 MED ORDER — SODIUM CHLORIDE 0.9 % IV BOLUS
1000.0000 mL | Freq: Once | INTRAVENOUS | Status: AC
  Administered 2021-01-07: 1000 mL via INTRAVENOUS

## 2021-01-07 MED ORDER — KETOROLAC TROMETHAMINE 15 MG/ML IJ SOLN
15.0000 mg | Freq: Once | INTRAMUSCULAR | Status: AC
  Administered 2021-01-07: 15 mg via INTRAVENOUS
  Filled 2021-01-07: qty 1

## 2021-01-07 MED ORDER — ACETAMINOPHEN 500 MG PO TABS
1000.0000 mg | ORAL_TABLET | Freq: Once | ORAL | Status: AC
  Administered 2021-01-07: 1000 mg via ORAL
  Filled 2021-01-07: qty 2

## 2021-01-07 MED ORDER — IOHEXOL 350 MG/ML SOLN
100.0000 mL | Freq: Once | INTRAVENOUS | Status: AC | PRN
  Administered 2021-01-07: 100 mL via INTRAVENOUS

## 2021-01-07 MED ORDER — SODIUM CHLORIDE 0.9 % IV BOLUS
500.0000 mL | Freq: Once | INTRAVENOUS | Status: AC
  Administered 2021-01-07: 500 mL via INTRAVENOUS

## 2021-01-07 MED ORDER — MAGNESIUM SULFATE 2 GM/50ML IV SOLN
2.0000 g | Freq: Once | INTRAVENOUS | Status: AC
  Administered 2021-01-07: 2 g via INTRAVENOUS
  Filled 2021-01-07: qty 50

## 2021-01-07 MED ORDER — ACETAMINOPHEN 325 MG PO TABS: 650.0000 mg | ORAL_TABLET | Freq: Four times a day (QID) | ORAL | Status: DC | PRN

## 2021-01-07 NOTE — ED Triage Notes (Signed)
Pt last seen normal at 1155 then had a sharp pain in back of head, pain subsided, pt had second episode about 1600, numbness on rt side of face with some blurred vision. 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 Multiple TIA's

## 2021-01-07 NOTE — ED Provider Notes (Signed)
[facility] EMERGENCY DEPT Provider Note   CSN: [id number] Arrival date & time: 01/07/21  1706  An emergency department physician performed an initial assessment on this suspected stroke patient at 1730.  History Chief Complaint  Patient presents with   Neurologic Problem   Code Stroke    MARQUIS DOWN is a 70 y.o. male.  Patient is a 70 year old male with a history of hypertension, recently diagnosed vascular dementia, hyperlipidemia and prior strokes who presents with headache and right-sided weakness.  He has had a prior history of strokes in the past with some right-sided weakness and has had a tremor of the right side in the past per his wife.  She states that at 12:00 today he had onset of pain in the back of his head.  He said it was intense.  He took some Tylenol and went to sleep.  When he woke up around 4, he had ongoing pain and some numbness in his face.  He was describing some numbness to the right side of his face and having some weakness in his right arm.  He also complains of some blurry vision.  She does say that yesterday he had a little bit of a headache that resolved.  During that time, she noticed that his speech was a little bit "thick" and that he was having a little bit of hard time walking.  However those symptoms had resolved by the nighttime and he was having none of those symptoms this morning when he woke up.      Past Medical History:  Diagnosis Date   Anxiety    Coronary artery disease    Depression    Essential tremor    GERD (gastroesophageal reflux disease)    High cholesterol    Hypertension    Myocardial infarct (HCC)    Orthostatic hypotension    Stroke [facility])    Vascular dementia [facility])     Patient Active Problem List   Diagnosis Date Noted   Coronary arteriosclerosis 12/24/2020   Asthma 12/24/2020   Chronic alcoholism in remission ([place]) 12/24/2020   Disorder of bursae and tendons in shoulder region 12/24/2020   Benign  hypertension 12/24/2020   Gastric ulcer with hemorrhage 12/24/2020   Gout 12/24/2020   Major depressive disorder, single episode, severe, 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[place]) 12/24/2020   Mitral valve prolapse 12/24/2020   Other and unspecified mycoses 12/24/2020   Chronic gingivitis, non-plaque induced 12/24/2020   Posttraumatic stress disorder 12/24/2020   Paroxysmal atrial fibrillation (HCC) 12/24/2020   Essential tremor 12/24/2020   GAD (generalized anxiety disorder) 12/24/2020   Anemia 09/30/2020   Restless legs syndrome (RLS) 09/30/2020   Elevated liver enzymes 05/26/2020   Orthostatic hypotension 10/01/2018   Depression 06/01/2018   Fall 06/01/2018   Gastroesophageal reflux disease without esophagitis 12/13/2017   Insomnia 09/26/2017   CKD (chronic kidney disease) stage 3, GFR 30-59 ml/min ([place]) 09/14/2017   Pacemaker 05/25/2017   History of cardiac radiofrequency ablation (RFA) 12/18/2016   Sinus bradycardia 12/18/2016   Erectile dysfunction 02/12/2013   Essential hypertension 02/12/2013   Other and unspecified hyperlipidemia 02/12/2013   Presence of stent in LAD coronary artery 02/12/2013    Past Surgical History:  Procedure Laterality Date   APPENDECTOMY     CARDIAC SURGERY     CHOLECYSTECTOMY     HERNIA REPAIR     NASAL SINUS SURGERY     SHOULDER SURGERY         Family History  Problem Relation  Age of Onset   Hypertension Mother    Stroke Mother    Stroke Father    Hypertension Father     Social History   Tobacco Use   Smoking status: Never    Passive exposure: Never   Smokeless tobacco: Never  Vaping Use   Vaping Use: Never used  Substance Use Topics   Alcohol use: Not Currently   Drug use: Never    Home Medications Prior to Admission medications   Medication Sig Start Date End Date Taking? Authorizing Provider  albuterol (VENTOLIN HFA) 108 (90 Base) MCG/ACT inhaler Inhale into the lungs.    [provider]  allopurinol (ZYLOPRIM) 100 MG  tablet Take 100 mg by mouth daily. 09/22/20   [provider]  aspirin 81 MG EC tablet Take 1 tablet by mouth daily. 05/05/14   [provider]  atorvastatin (LIPITOR) 80 MG tablet Take 80 mg by mouth daily. 11/18/20   [provider]  clopidogrel (PLAVIX) 75 MG tablet Take 75 mg by mouth daily. 11/18/20   [provider]  escitalopram (LEXAPRO) 20 MG tablet Take 20 mg by mouth daily. 11/03/20   [provider]  ferrous sulfate 325 (65 FE) MG EC tablet Take by mouth. 10/02/20 12/31/20  [provider]  hydrOXYzine (VISTARIL) 50 MG capsule Take 1 capsule (50 mg total) by mouth 2 (two) times daily as needed for anxiety. 12/24/20   Luetta Nutting, DO  ketoconazole (NIZORAL) 2 % cream Apply topically 2 (two) times daily. 12/16/20   [provider]  latanoprost (XALATAN) 0.005 % ophthalmic solution 1 drop at bedtime. 12/07/20   [provider]  losartan (COZAAR) 25 MG tablet Take 25 mg by mouth daily. Take as need when blood pressure is over 160 09/26/20   [provider]  magnesium oxide (MAG-OX) 400 MG tablet Take by mouth.    [provider]  metoprolol tartrate (LOPRESSOR) 25 MG tablet Take 12.5 mg by mouth 2 (two) times daily. Take 1/2 tablet twice daily 11/18/20   [provider]  nitroGLYCERIN (NITROLINGUAL) 0.4 MG/SPRAY spray 1 SPRAY UNDER THE TONGUE AS DIRECTED 05/05/14   [provider]  pantoprazole (PROTONIX) 40 MG tablet Take 40 mg by mouth 2 (two) times daily. 11/18/20   [provider]  potassium citrate (UROCIT-K) 10 MEQ (1080 MG) SR tablet Take by mouth. 09/22/20   [provider]  primidone (MYSOLINE) 50 MG tablet Take 100 mg by mouth 2 (two) times daily. 09/26/20   [provider]  QUEtiapine (SEROQUEL) 50 MG tablet Take 100 mg by mouth at bedtime. Take 2 tabs at night 12/14/20   [provider]  ranolazine (RANEXA) 1000 MG SR tablet Take 1,000 mg by mouth 2 (two)  times daily. 11/18/20   [provider]  rOPINIRole (REQUIP) 3 MG tablet Take 3 mg by mouth at bedtime. 10/13/20   [provider]    Allergies    Dilaudid [hydromorphone], Codeine, Penicillins, and Phenobarbital  Review of Systems   Review of Systems  Constitutional:  Negative for chills, diaphoresis, fatigue and fever.  HENT:  Negative for congestion, rhinorrhea and sneezing.   Eyes: Negative.   Respiratory:  Negative for cough, chest tightness and shortness of breath.   Cardiovascular:  Negative for chest pain and leg swelling.  Gastrointestinal:  Negative for abdominal pain, blood in stool, diarrhea, nausea and vomiting.  Genitourinary:  Negative for difficulty urinating, flank pain, frequency and hematuria.  Musculoskeletal:  Negative for arthralgias  and back pain.  Skin:  Negative for rash.  Neurological:  Positive for weakness, numbness and headaches. Negative for dizziness and speech difficulty.   Physical Exam Updated Vital Signs BP (!) 148/90 (BP Location: Left Arm)   Pulse 76   Temp 98.7 F (37.1 C) (Oral)   Resp 11   Ht _0  (1.854 m)   Wt 108.9 kg   SpO2 96%   BMI 31.66 kg/m   Physical Exam Constitutional:      Appearance: He is well-developed.  HENT:     Head: Normocephalic and atraumatic.  Eyes:     Pupils: Pupils are equal, round, and reactive to light.  Cardiovascular:     Rate and Rhythm: Normal rate and regular rhythm.     Heart sounds: Normal heart sounds.  Pulmonary:     Effort: Pulmonary effort is normal. No respiratory distress.     Breath sounds: Normal breath sounds. No wheezing or rales.  Chest:     Chest wall: No tenderness.  Abdominal:     General: Bowel sounds are normal.     Palpations: Abdomen is soft.     Tenderness: There is no abdominal tenderness. There is no guarding or rebound.  Musculoskeletal:        General: Normal range of motion.     Cervical back: Normal range of motion and neck supple.   Lymphadenopathy:     Cervical: No cervical adenopathy.  Skin:    General: Skin is warm and dry.     Findings: No rash.  Neurological:     Mental Status: He is alert and oriented to person, place, and time.     Comments: There is some numbness to the right side of the face as compared to left.  No obvious slurred speech.  No aphasia noted.  He has a little bit of decreased vision to the right visual fields.  He has some weakness in his right arm and right leg as compared to the left with some associated decrease sensation to light touch.    ED Results / Procedures / Treatments   Labs (all labs ordered are listed, but only abnormal results are displayed) Labs Reviewed  ETHANOL - Abnormal; Notable for the following components:      Result Value   Alcohol, Ethyl (B) 11 (*)    All other components within normal limits  COMPREHENSIVE METABOLIC PANEL - Abnormal; Notable for the following components:   Sodium 134 (*)    Glucose, Bld 117 (*)    BUN 27 (*)    Creatinine, Ser 1.25 (*)    All other components within normal limits  RESP PANEL BY RT-PCR (FLU A&B, COVID) ARPGX2  PROTIME-INR  APTT  CBC  DIFFERENTIAL  RAPID URINE DRUG SCREEN, HOSP PERFORMED  URINALYSIS, ROUTINE W REFLEX MICROSCOPIC    EKG EKG Interpretation  Date/Time:  Thursday January 07 2021 17:17:45 EDT Ventricular Rate:  76 PR Interval:    QRS Duration: 89 QT Interval:  416 QTC Calculation: 468 R Axis:   9 Text Interpretation: ATRIAL PACED RHYTHM similar to prior EKG Confirmed by Malvin Johns [phone]) on 01/07/2021 6:25:42 PM  Radiology CT HEAD CODE STROKE WO CONTRAST  Result Date: 01/07/2021 CLINICAL DATA:  Code stroke. Neuro deficit, acute, stroke suspected. EXAM: CT HEAD WITHOUT CONTRAST TECHNIQUE: Contiguous axial images were obtained from the base of the skull through the vertex without intravenous contrast. COMPARISON:  Noncontrast head CT 12/27/2020. FINDINGS: Brain: Redemonstrated foci of chronic  encephalomalacia within  the anterior frontal lobes bilaterally. Background mild generalized cerebral and cerebellar atrophy. Unchanged focus of chronic small-vessel ischemia within the right corona radiata (for instance as seen on series 4, image 21). Background patchy and ill-defined hypoattenuation within the cerebral white matter, nonspecific but compatible with chronic small vessel ischemic disease. Redemonstrated chronic lacunar infarct within the left caudate nucleus. There is no acute intracranial hemorrhage. No demarcated cortical infarct. No extra-axial fluid collection. No evidence of an intracranial mass. No midline shift. Vascular: No hyperdense vessel.  Atherosclerotic calcifications. Skull: Bifrontal cranioplasty. Sinuses/Orbits: Visualized orbits show no acute finding. Postsurgical appearance of the paranasal sinuses. Paranasal sinus disease. Most notably, there is extensive opacification of the posterior left ethmoid air cells, near complete opacification of the right maxillary sinus and moderate mucosal thickening within the left maxillary sinus. Other: Bilateral mastoid effusions. ASPECTS [facility] Stroke Program Early CT Score) - Ganglionic level infarction (caudate, lentiform nuclei, internal capsule, insula, M1-M3 cortex): 7 - Supraganglionic infarction (M4-M6 cortex): 3 Total score (0-10 with 10 being normal): 10 (when discounting chronic infarcts). These results were called by telephone at the time of interpretation on 01/07/2021 at 5:46 pm to provider Shylee Durrett , who verbally acknowledged these results. IMPRESSION: No evidence of acute intracranial abnormality. Redemonstrated chronic encephalomalacia within the bilateral frontal lobes, likely posttraumatic. Unchanged focus of chronic small-vessel ischemia within the right frontal lobe white matter. Background chronic small-vessel ischemic changes within the cerebral white matter. Redemonstrated chronic left basal ganglia lacunar infarct.  Mild generalized parenchymal atrophy. Paranasal sinus disease, as described. Bilateral mastoid effusions. Electronically Signed   By: Kellie Simmering D.O.   On: 01/07/2021 17:47   CT ANGIO HEAD NECK W WO CM (CODE STROKE)  Result Date: 01/07/2021 CLINICAL DATA:  Stroke code. Additional history provided by ER physician: Right-sided deficits. EXAM: CT ANGIOGRAPHY HEAD AND NECK TECHNIQUE: Multidetector CT imaging of the head and neck was performed using the standard protocol during bolus administration of intravenous contrast. Multiplanar CT image reconstructions and MIPs were obtained to evaluate the vascular anatomy. Carotid stenosis measurements (when applicable) are obtained utilizing NASCET criteria, using the distal internal carotid diameter as the denominator. CONTRAST:  Administered contrast not known at this time. COMPARISON:  Noncontrast head CT performed earlier today 01/07/2021. FINDINGS: CTA NECK FINDINGS Aortic arch: Standard aortic branching. Atherosclerotic plaque within the visualized aortic arch and proximal major branch vessels of the neck. No hemodynamically significant innominate or proximal subclavian artery stenosis. Right carotid system: CCA and ICA patent within the neck. Calcified plaque within the carotid bifurcation and proximal ICA. Quantification of stenosis within the proximal right ICA is difficult due to irregularity of calcified plaque at this site. However, stenosis is estimated at up to 50%. Left carotid system: CCA and ICA patent within the neck without significant stenosis (50% or greater). Soft and calcified plaque within the carotid bifurcation and proximal ICA. Vertebral arteries: Vertebral arteries codominant and patent within the neck without hemodynamically significant stenosis. Skeleton: Cervical spondylosis. No acute bony abnormality or aggressive osseous lesion. Other neck: No neck mass or cervical lymphadenopathy. Upper chest: No consolidation within the imaged lung  apices. Review of the MIP images confirms the above findings CTA HEAD FINDINGS Anterior circulation: The intracranial internal carotid arteries are patent. Calcified plaque within both vessels with no more than mild stenosis. The M1 middle cerebral arteries are patent. No M2 proximal branch occlusion is identified. Atherosclerotic irregularity of the M2 and more distal MCA vessels bilaterally. Most notably, there are multifocal severe stenoses within  a mid M2 right MCA vessel and a severe stenosis within a distal M2 right MCA vessel. The anterior cerebral arteries are patent. No intracranial aneurysm is identified. Posterior circulation: The intracranial vertebral arteries are patent. Calcified plaque within the proximal V4 left vertebral artery with moderate stenosis. The basilar artery is patent. The posterior cerebral arteries are patent. Posterior communicating arteries are hypoplastic or absent bilaterally. Venous sinuses: Within the limitations of contrast timing, no convincing thrombus. Anatomic variants: As described Review of the MIP images confirms the above findings No intracranial large vessel occlusion identified. These results were called by telephone at the time of interpretation on 01/07/2021 at 6:21 pm to provider Dr. Curly Shores, who verbally acknowledged these results. IMPRESSION: CTA neck: 1. The common carotid and internal carotid arteries are patent within the neck. Estimation of stenosis within the proximal right ICA is difficult due to irregularity of calcified plaque at this site, but is estimated at up to 50% stenosis. Mild soft and calcified plaque within the left carotid bifurcation and proximal left ICA with less than 50% stenosis at this site. 2. Vertebral arteries patent within the neck without hemodynamically significant stenosis. CTA head: 1. No intracranial large vessel occlusion identified. 2. Intracranial atherosclerotic disease with multifocal stenoses, most notably as follows. 3.  Multifocal severe stenoses within a mid M2 right MCA vessel. 4. Severe stenosis within a distal M2 right MCA vessel. 5. Atherosclerotic plaque within the intracranial internal carotid arteries with no more than mild stenosis. 6. Moderate atherosclerotic narrowing of the proximal V4 left vertebral artery. Electronically Signed   By: Kellie Simmering D.O.   On: 01/07/2021 18:30    Procedures Procedures   Medications Ordered in ED Medications  iohexol (OMNIPAQUE) 350 MG/ML injection 100 mL (100 mLs Intravenous Contrast Given 01/07/21 1816)    ED Course  I have reviewed the triage vital signs and the nursing notes.  Pertinent labs & imaging results that were available during my care of the patient were reviewed by me and considered in my medical decision making (see chart for details).    MDM Rules/Calculators/A&P                           Code stroke was activated on arrival given his associated visual deficits.  He was out of the window for tPA.  CTA was performed which shows no evidence of large vessel occlusion.  He was seen by Dr. Curly Shores.  His labs are nonconcerning.  She recommends treatment of his headache but avoiding opioids.  She recommends admission for further stroke work-up.  He is unable to get an MRI today given his pacemaker.  I spoke with Dr. Bridgett Larsson who will admit the patient for further treatment. Final Clinical Impression(s) / ED Diagnoses Final diagnoses:  Right sided weakness    Rx / DC Orders ED Discharge Orders     None        Malvin Johns, MD 01/07/21 1906

## 2021-01-07 NOTE — Plan of Care (Signed)
  Problem: Education: Goal: Knowledge of General Education information will improve Description: Including pain rating scale, medication(s)/side effects and non-pharmacologic comfort measures Outcome: Progressing   Problem: Health Behavior/Discharge Planning: Goal: Ability to manage health-related needs will improve Outcome: Progressing   Problem: Clinical Measurements: Goal: Ability to maintain clinical measurements within normal limits will improve Outcome: Progressing Goal: Will remain free from infection Outcome: Progressing Goal: Diagnostic test results will improve Outcome: Progressing Goal: Respiratory complications will improve Outcome: Progressing Goal: Cardiovascular complication will be avoided Outcome: Progressing   Problem: Activity: Goal: Risk for activity intolerance will decrease Outcome: Progressing   Problem: Nutrition: Goal: Adequate nutrition will be maintained Outcome: Progressing   Problem: Coping: Goal: Level of anxiety will decrease Outcome: Progressing   Problem: Elimination: Goal: Will not experience complications related to bowel motility Outcome: Progressing Goal: Will not experience complications related to urinary retention Outcome: Progressing   Problem: Pain Managment: Goal: General experience of comfort will improve Outcome: Progressing   Problem: Safety: Goal: Ability to remain free from injury will improve Outcome: Progressing   Problem: Skin Integrity: Goal: Risk for impaired skin integrity will decrease Outcome: Progressing   Problem: Education: Goal: Knowledge of disease or condition will improve Outcome: Progressing Goal: Knowledge of secondary prevention will improve Outcome: Progressing Goal: Knowledge of patient specific risk factors addressed and post discharge goals established will improve Outcome: Progressing Goal: Individualized Educational Video(s) Outcome: Progressing   Problem: Coping: Goal: Will verbalize  positive feelings about self Outcome: Progressing Goal: Will identify appropriate support needs Outcome: Progressing   Problem: Health Behavior/Discharge Planning: Goal: Ability to manage health-related needs will improve Outcome: Progressing   Problem: Self-Care: Goal: Ability to participate in self-care as condition permits will improve Outcome: Progressing Goal: Verbalization of feelings and concerns over difficulty with self-care will improve Outcome: Progressing Goal: Ability to communicate needs accurately will improve Outcome: Progressing   Problem: Nutrition: Goal: Risk of aspiration will decrease Outcome: Progressing Goal: Dietary intake will improve Outcome: Progressing   Problem: Intracerebral Hemorrhage Tissue Perfusion: Goal: Complications of Intracerebral Hemorrhage will be minimized Outcome: Progressing   Problem: Ischemic Stroke/TIA Tissue Perfusion: Goal: Complications of ischemic stroke/TIA will be minimized Outcome: Progressing   

## 2021-01-07 NOTE — ED Notes (Signed)
Vitals in; will obtain temp when back from CT

## 2021-01-07 NOTE — Telephone Encounter (Signed)
Can we update to provider in [facility] network?  Thanks!

## 2021-01-07 NOTE — ED Notes (Signed)
Patient's headache unchanged. Notified provider for additional pain medication.

## 2021-01-07 NOTE — ED Notes (Signed)
Called [facility] to transport patient to [facility] 3W room 16

## 2021-01-07 NOTE — Consult Note (Signed)
[facility] Telemedicine Consult  Requesting Provider: Malvin Johns Consult Participants: Patient, wife, bedside nurse, atrium nurse Location of the provider: [facility] Location of the patient: [facility]  This consult was provided via telemedicine with 2-way video and audio communication. The patient/family was informed that care would be provided in this way and agreed to receive care in this manner.    Chief Complaint: Headache, blurred vision  HPI: 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  History is obtained primarily from the patient's wife while patient was in scanner given the reported vascular dementia as well.  Key details are confirmed with the patient.  She reports that he had been in his normal state of health and had taken his aspirin and Plavix at 11 AM this is typical.  At noon he complained of severe headache which he has had with "2 out of 3 of his prior strokes."  However on her assessment he had no other new symptoms at that time and therefore she given some pain medications and helped him get to bed for a nap.  At 2 PM he called out to her telling her he felt very dizzy which is unusual for him and at 4 PM he began to complain of right cheek numbness at which point she Became concerned that he was indeed having another stroke and therefore brought him to the ED for further evaluation.  Regarding his dementia  and she notes that at baseline he is able to dress himself and uses a hand-held shower but she assists him in and out of the shower.  He does use a walker to walk although she walks closely beside him and he does have frequent falls.  She manages medications, finances etc. and he is unable to drive due to his limitations.  She notes that they recently moved and establish care with new physician who have discontinued his trazodone and Vistaril due to concern for polypharmacy contributing to his cognitive impairments.  She notes that she manages all his medications and keeps a careful eye on potentially abusable medications including his Requip, Seroquel etc. due to his history of alcohol use disorder.  She notes he has been sober for 3 years and that given his baseline disability she is confident he does not have any access to alcohol.  LKW: Noon on 9/8 tpa given?: No, due to out of the window IR Thrombectomy? No, no LVO Modified Rankin Scale: 3-Moderate disability-requires help but walks WITHOUT assistance Time of teleneurologist evaluation: 5:36 PM   Current Facility-Administered Medications:    acetaminophen (TYLENOL) tablet 1,000 mg, 1,000 mg, Oral, Once, Malvin Johns, MD   acetaminophen (TYLENOL) tablet 650 mg, 650 mg, Oral, Q6H PRN, Carlis Burnsworth L, MD   magnesium sulfate IVPB 2 g 50 mL, 2 g, Intravenous, Once, Wilfredo Canterbury L, MD   sodium chloride 0.9 % bolus 1,000 mL, 1,000 mL, Intravenous, Once, [place] Delauder L, MD  Current Outpatient Medications:    albuterol (VENTOLIN HFA) 108 (90 Base) MCG/ACT inhaler, Inhale into the lungs., Disp: , Rfl:  allopurinol (ZYLOPRIM) 100 MG tablet, Take 100 mg by mouth daily., Disp: , Rfl:    aspirin 81 MG EC tablet, Take 1 tablet by mouth daily., Disp: , Rfl:    atorvastatin (LIPITOR) 80 MG tablet, Take 80 mg by mouth daily., Disp: , Rfl:    clopidogrel (PLAVIX) 75 MG tablet, Take 75 mg by mouth daily., Disp: , Rfl:    escitalopram (LEXAPRO) 20  MG tablet, Take 20 mg by mouth daily., Disp: , Rfl:    ferrous sulfate 325 (65 FE) MG EC tablet, Take by mouth., Disp: , Rfl:    hydrOXYzine (VISTARIL) 50 MG capsule, Take 1 capsule (50 mg total) by mouth 2 (two) times daily as needed for anxiety., Disp: 14 capsule, Rfl: 0   ketoconazole (NIZORAL) 2 % cream, Apply topically 2 (two) times daily., Disp: , Rfl:    latanoprost (XALATAN) 0.005 % ophthalmic solution, 1 drop at bedtime., Disp: , Rfl:    losartan (COZAAR) 25 MG tablet, Take 25 mg by mouth daily. Take as need when blood pressure is over 160, Disp: , Rfl:    magnesium oxide (MAG-OX) 400 MG tablet, Take by mouth., Disp: , Rfl:    metoprolol tartrate (LOPRESSOR) 25 MG tablet, Take 12.5 mg by mouth 2 (two) times daily. Take 1/2 tablet twice daily, Disp: , Rfl:    nitroGLYCERIN (NITROLINGUAL) 0.4 MG/SPRAY spray, 1 SPRAY UNDER THE TONGUE AS DIRECTED, Disp: , Rfl:    pantoprazole (PROTONIX) 40 MG tablet, Take 40 mg by mouth 2 (two) times daily., Disp: , Rfl:    potassium citrate (UROCIT-K) 10 MEQ (1080 MG) SR tablet, Take by mouth., Disp: , Rfl:    primidone (MYSOLINE) 50 MG tablet, Take 100 mg by mouth 2 (two) times daily., Disp: , Rfl:    QUEtiapine (SEROQUEL) 50 MG tablet, Take 100 mg by mouth at bedtime. Take 2 tabs at night, Disp: , Rfl:    ranolazine (RANEXA) 1000 MG SR tablet, Take 1,000 mg by mouth 2 (two) times daily., Disp: , Rfl:    rOPINIRole (REQUIP) 3 MG tablet, Take 3 mg by mouth at bedtime., Disp: , Rfl:    Exam: Vitals:   01/07/21 1800 01/07/21 1801  BP: (!) 151/90   Pulse: 73   Resp: 13   Temp: 98.7 F (37.1 C)   SpO2: 99% 99%    General: Intermittently blinking both of his eyes in a tic-like fashion, intermittently shaking in the right upper extremity which is a suppressible tremor, otherwise appears comfortable at rest in no acute distress but complains of severe headache and asks repeatedly for pain medication Pulmonary: breathing comfortably Cardiac: regular rate  and rhythm on monitor   NIH Stroke scale 1A: Level of Consciousness - 0 1B: Ask Month and Age - 0 1C: 'Blink Eyes' & 'Squeeze Hands' - 0 2: Test Horizontal Extraocular Movements - 0 3: Test Visual Fields - 1 - possible left lower quadrant cut on bedside nursing examination 4: Test Facial Palsy - 0  5A: Test Left Arm Motor Drift - 0 5B: Test Right Arm Motor Drift - 1 6A: Test Left Leg Motor Drift - 0 6B: Test Right Leg Motor Drift - 1 7: Test Limb Ataxia - 0 8: Test Sensation - 1 9: Test Language/Aphasia- 1 slowed speech, with some hesitancy that wife reports is not his baseline 10: Test Dysarthria - 0 11: Test Extinction/Inattention - 1 intermittently neglect the right side NIHSS score: 6  Labs reviewed in epic and pertinent values follow:  CBC within normal limits, glucose 117, creatinine 1.25  Ethanol 11 UDS appropriately positive for barbiturates given patient is on primidone  Imaging Reviewed:  Head CT negative for acute intracranial process Full read:  No evidence of acute intracranial abnormality. Redemonstrated chronic encephalomalacia within the bilateral frontal lobes, likely posttraumatic.  Unchanged focus of chronic small-vessel ischemia within the right frontal lobe white matter.  Background chronic small-vessel ischemic changes within the cerebral white matter.  Redemonstrated chronic left basal ganglia lacunar infarct. Mild generalized parenchymal atrophy.  Paranasal sinus disease, as described.  Bilateral mastoid effusions.  CTA neck: 1. The common carotid and internal carotid arteries are patent within the neck. Estimation of stenosis within the proximal right ICA is difficult due to irregularity of calcified plaque at this site, but is estimated at up to 50% stenosis. Mild soft and calcified plaque within the left carotid bifurcation and proximal left ICA with less than 50% stenosis at this site. 2. Vertebral arteries patent within the neck without  hemodynamically significant stenosis.  CTA head: 1. No intracranial large vessel occlusion identified.  2. Intracranial atherosclerotic disease with multifocal stenoses, most notably as follows. 3. Multifocal severe stenoses within a mid M2 right MCA vessel.  4. Severe stenosis within a distal M2 right MCA vessel. 5. Atherosclerotic plaque within the intracranial internal carotid arteries with no more than mild stenosis. 6. Moderate atherosclerotic narrowing of the proximal V4 left vertebral artery.  Assessment: Recrudescence in the setting of systemic process vs. new stroke versus complex migraines vs. transient hypoperfusion/TIA given severe stenosis in the right MCA vascular territory as detailed above.  Potential element of pain medication seeking behavior  Recommendations:   # Right sided weakness, stroke  - Stroke labs HgbA1c, fasting lipid panel - MRI brain, will need pacemaker dependent protocol but confirmed with family pacer is MRI compatible  - Frequent neuro checks - Echocardiogram given significant cardiac history and dizziness to exclude new pathology - Continue home DAPT - Risk factor modification - Telemetry monitoring - Blood pressure goal   - Permissive hypertension to 220/120  - PT consult, OT consult, Speech consult, unless patient returns back to baseline - Stroke team to follow, given his extensive history as detailed above,  # Headache management - 1 L NS - 2 g Mag - Tylenol 650 mg q6hr PRN - Avoid opiates due to risk of clotting neurological examination as well as risk of rebound headaches with opiate use  # History of alcohol use - Ethanol just above normal limit of detection, recommend following up further with patient and wife again gvien she denied he has any ability to access alcohol and ongoing use could certainly be playing a role in his presentation  This patient is receiving care for possible acute neurological changes. There was 55 minutes of care  by this provider at the time of service, including time for direct evaluation via telemedicine, review of medical records, imaging studies and discussion of findings with providers, the patient and/or family.   Lesleigh Noe MD-PhD [facility] [phone] If 8pm-8am, please page neurology on call as listed in [place].  CRITICAL CARE Performed by: Lorenza Chick   Total critical care time: 55 minutes  Critical care time was exclusive of separately billable procedures and treating other patients.  Critical care was necessary to treat or prevent imminent or life-threatening deterioration.  Critical care was time spent personally by me on the following activities: development of treatment plan with patient and/or surrogate as well as nursing,  discussions with consultants, evaluation of patient's response to treatment, examination of patient, obtaining history from patient or surrogate, ordering and performing treatments and interventions, ordering and review of laboratory studies, ordering and review of radiographic studies, pulse oximetry and re-evaluation of patient's condition.

## 2021-01-08 ENCOUNTER — Observation Stay (HOSPITAL_COMMUNITY): Payer: Medicare Other

## 2021-01-08 DIAGNOSIS — I10 Essential (primary) hypertension: Secondary | ICD-10-CM

## 2021-01-08 DIAGNOSIS — I350 Nonrheumatic aortic (valve) stenosis: Secondary | ICD-10-CM | POA: Diagnosis not present

## 2021-01-08 DIAGNOSIS — G2581 Restless legs syndrome: Secondary | ICD-10-CM

## 2021-01-08 DIAGNOSIS — F0150 Vascular dementia without behavioral disturbance: Secondary | ICD-10-CM

## 2021-01-08 DIAGNOSIS — I480 Paroxysmal atrial fibrillation: Secondary | ICD-10-CM

## 2021-01-08 DIAGNOSIS — M5481 Occipital neuralgia: Secondary | ICD-10-CM | POA: Diagnosis not present

## 2021-01-08 DIAGNOSIS — Z950 Presence of cardiac pacemaker: Secondary | ICD-10-CM

## 2021-01-08 DIAGNOSIS — F1021 Alcohol dependence, in remission: Secondary | ICD-10-CM

## 2021-01-08 LAB — ECHOCARDIOGRAM COMPLETE
AR max vel: 2.12 cm2
AV Area VTI: 1.94 cm2
AV Area mean vel: 1.88 cm2
AV Mean grad: 7 mmHg
AV Peak grad: 12 mmHg
Ao pk vel: 1.73 m/s
Area-P 1/2: 5.54 cm2
Height: 73 in
S' Lateral: 4.1 cm
Weight: 3844.82 oz

## 2021-01-08 LAB — COMPREHENSIVE METABOLIC PANEL
ALT: 40 U/L (ref 0–44)
AST: 53 U/L — ABNORMAL HIGH (ref 15–41)
Albumin: 3.4 g/dL — ABNORMAL LOW (ref 3.5–5.0)
Alkaline Phosphatase: 65 U/L (ref 38–126)
Anion gap: 10 (ref 5–15)
BUN: 24 mg/dL — ABNORMAL HIGH (ref 8–23)
CO2: 23 mmol/L (ref 22–32)
Calcium: 9.3 mg/dL (ref 8.9–10.3)
Chloride: 100 mmol/L (ref 98–111)
Creatinine, Ser: 1.32 mg/dL — ABNORMAL HIGH (ref 0.61–1.24)
GFR, Estimated: 59 mL/min — ABNORMAL LOW (ref >60)
Glucose, Bld: 125 mg/dL — ABNORMAL HIGH (ref 70–99)
Potassium: 4.1 mmol/L (ref 3.5–5.1)
Sodium: 133 mmol/L — ABNORMAL LOW (ref 135–145)
Total Bilirubin: 0.6 mg/dL (ref 0.3–1.2)
Total Protein: 6.3 g/dL — ABNORMAL LOW (ref 6.5–8.1)

## 2021-01-08 LAB — CBC WITH DIFFERENTIAL/PLATELET
Abs Immature Granulocytes: 0.03 10*3/uL (ref 0.00–0.07)
Basophils Absolute: 0.1 10*3/uL (ref 0.0–0.1)
Basophils Relative: 1 %
Eosinophils Absolute: 0.2 10*3/uL (ref 0.0–0.5)
Eosinophils Relative: 3 %
HCT: 38.5 % — ABNORMAL LOW (ref 39.0–52.0)
Hemoglobin: 12.8 g/dL — ABNORMAL LOW (ref 13.0–17.0)
Immature Granulocytes: 0 %
Lymphocytes Relative: 32 %
Lymphs Abs: 2.2 10*3/uL (ref 0.7–4.0)
MCH: 31.6 pg (ref 26.0–34.0)
MCHC: 33.2 g/dL (ref 30.0–36.0)
MCV: 95.1 fL (ref 80.0–100.0)
Monocytes Absolute: 1 10*3/uL (ref 0.1–1.0)
Monocytes Relative: 14 %
Neutro Abs: 3.4 10*3/uL (ref 1.7–7.7)
Neutrophils Relative %: 50 %
Platelets: 190 10*3/uL (ref 150–400)
RBC: 4.05 MIL/uL — ABNORMAL LOW (ref 4.22–5.81)
RDW: 13.7 % (ref 11.5–15.5)
WBC: 6.9 10*3/uL (ref 4.0–10.5)
nRBC: 0 % (ref 0.0–0.2)

## 2021-01-08 LAB — LIPID PANEL
Cholesterol: 131 mg/dL (ref 0–200)
HDL: 47 mg/dL (ref >40)
LDL Cholesterol: 54 mg/dL (ref 0–99)
Total CHOL/HDL Ratio: 2.8 RATIO
Triglycerides: 152 mg/dL — ABNORMAL HIGH (ref <150)
VLDL: 30 mg/dL (ref 0–40)

## 2021-01-08 LAB — HIV ANTIBODY (ROUTINE TESTING W REFLEX): HIV Screen 4th Generation wRfx: NONREACTIVE

## 2021-01-08 LAB — HEMOGLOBIN A1C
Hgb A1c MFr Bld: 6 % — ABNORMAL HIGH (ref 4.8–5.6)
Mean Plasma Glucose: 125.5 mg/dL

## 2021-01-08 IMAGING — MR MR HEAD WO/W CM
14 of 17 series · 39 of 48 positions shown · IV contrast (Gadavist)
Comparison: No prior MRI, correlation is made with CT head
[DATE].

CLINICAL DATA: TIA

EXAM:
MRI HEAD WITHOUT AND WITH CONTRAST
TECHNIQUE: Multiplanar, multiecho pulse sequences of the brain and surrounding
structures were obtained without and with intravenous contrast.
CONTRAST:  10mL GADAVIST GADOBUTROL 1 MMOL/ML IV SOLN

[Series 5: DWI · axial · 3.0mm · 0.83mm/px · z∈[-17,+125]mm · 6 of 104 slices shown (1 of 4)]
[im 1/104]
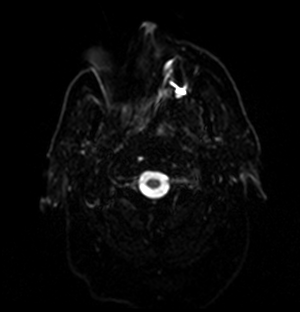
[im 21/104]
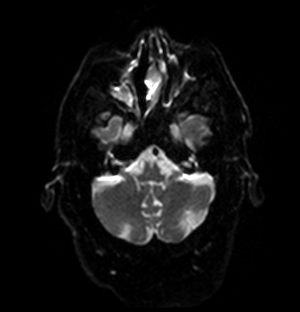
[im 42/104]
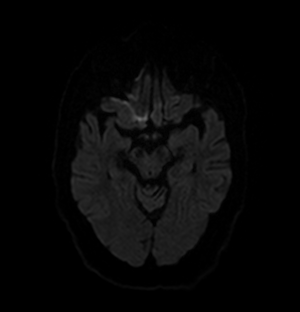
[im 62/104]
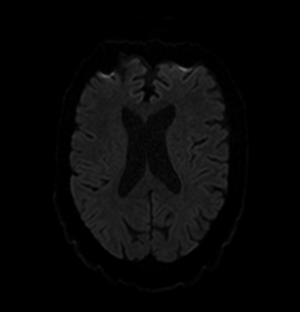
[im 83/104]
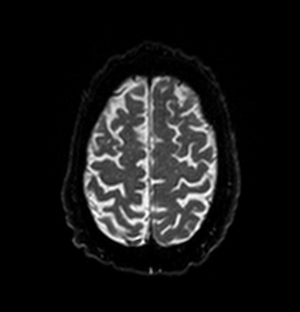
[im 104/104]
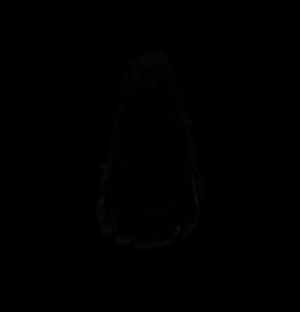

[Series 6: DWI · axial · 3.0mm · 0.83mm/px · z∈[-17,+125]mm · 2 of 52 slices shown (2 of 4)]
[im 1/52]
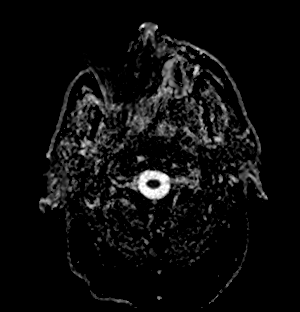
[im 52/52]
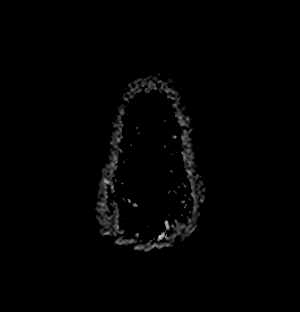

[Series 7: DWI · coronal · 4.0mm · 0.88mm/px · 4 of 76 slices shown (3 of 4)]
[im 1/76]
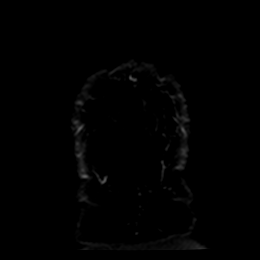
[im 26/76]
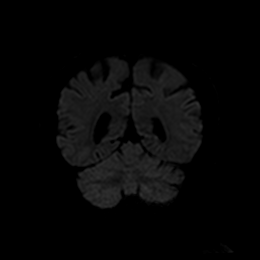
[im 51/76]
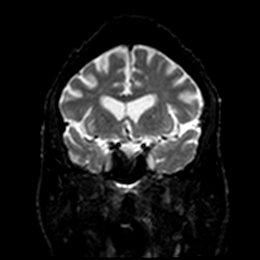
[im 76/76]
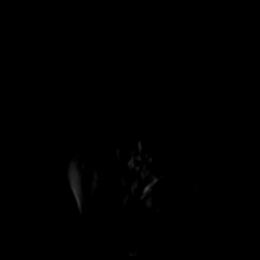

[Series 8: DWI · coronal · 4.0mm · 0.88mm/px · 1 of 38 slices shown (4 of 4)]
[im 1/38]
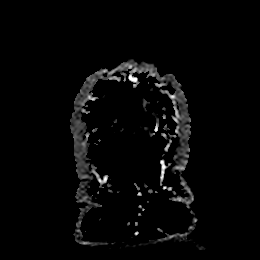

[Series 9: T1 · sagittal · 5.0mm · 0.78mm/px · 1 of 25 slices shown]
[im 1/25]
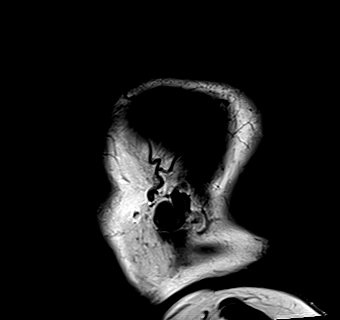

[Series 10: T2 · axial · 5.0mm · 0.78mm/px · z∈[-14,+119]mm · 2 of 25 slices shown]
[im 1/25]
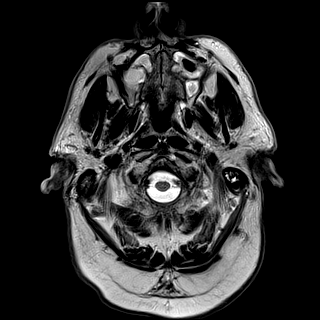
[im 25/25]
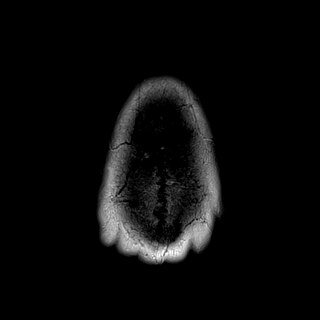

[Series 11: FLAIR · axial · 5.0mm · 0.49mm/px · z∈[-16,+118]mm · 2 of 25 slices shown]
[im 1/25]
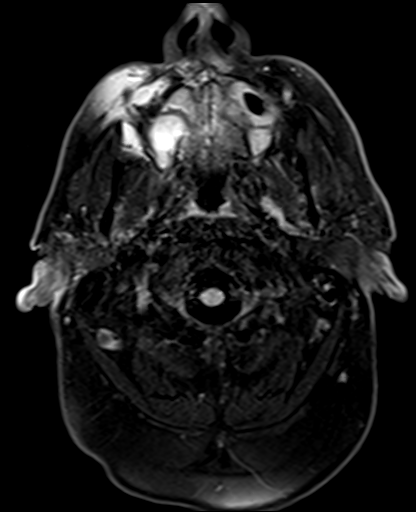
[im 25/25]
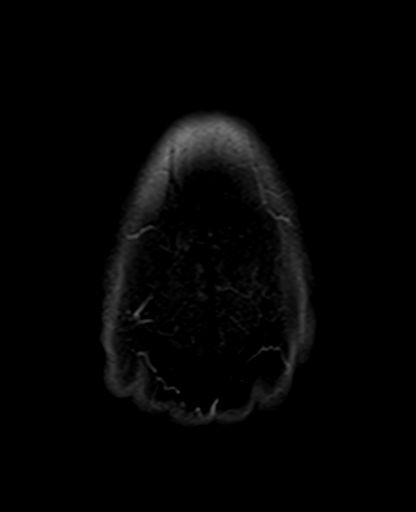

[Series 12: mag_images · axial · 3.0mm · 0.98mm/px · z∈[-26,+127]mm · 4 of 56 slices shown]
[im 1/56]
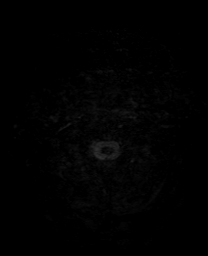
[im 19/56]
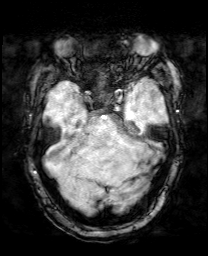
[im 37/56]
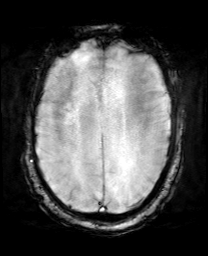
[im 56/56]
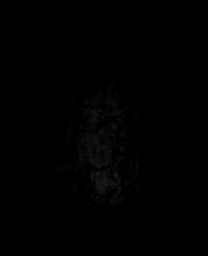

[Series 13: pha_images · axial · 3.0mm · 0.98mm/px · z∈[-26,+127]mm · 4 of 56 slices shown]
[im 1/56]
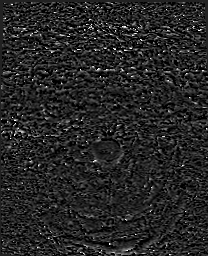
[im 19/56]
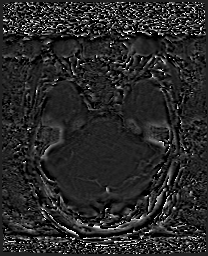
[im 37/56]
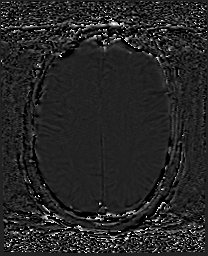
[im 56/56]
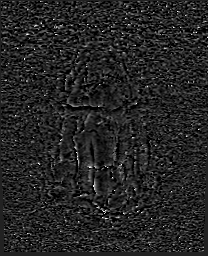

[Series 14: swi_images · axial · 3.0mm · 0.98mm/px · z∈[-26,+127]mm · 4 of 56 slices shown]
[im 1/56]
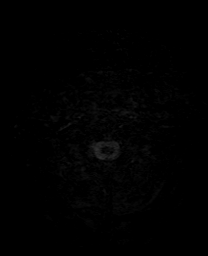
[im 19/56]
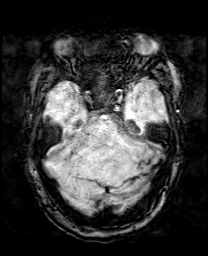
[im 37/56]
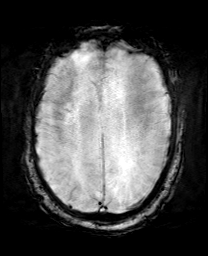
[im 56/56]
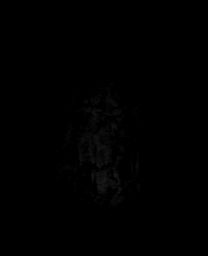

[Series 15: mip_images(sw) · axial · 24.0mm · 0.98mm/px · z∈[-16,+118]mm · 3 of 49 slices shown]
[im 1/49]
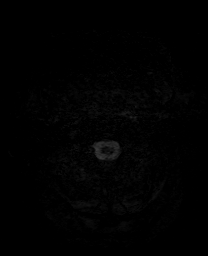
[im 25/49]
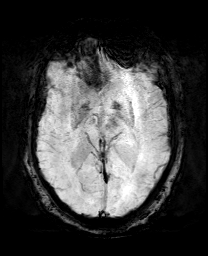
[im 49/49]
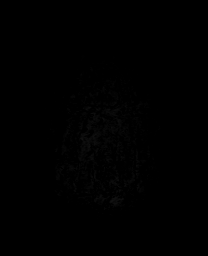

[Series 17: T2 post-contrast · coronal · 5.0mm · 0.72mm/px · 2 of 32 slices shown]
[im 1/32]
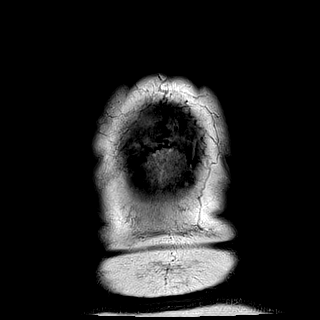
[im 32/32]
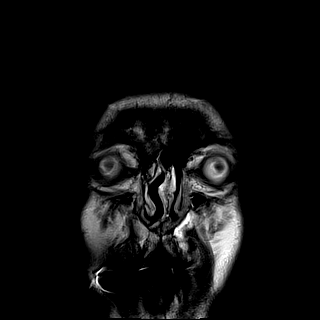

[Series 20: T1 post-contrast · sagittal · 5.0mm · 0.78mm/px · 2 of 25 slices shown (1 of 2)]
[im 1/25]
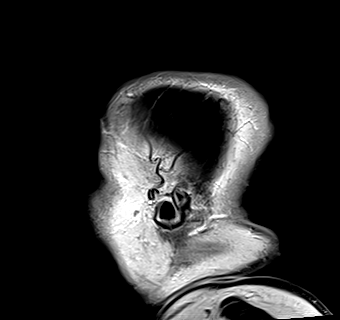
[im 25/25]
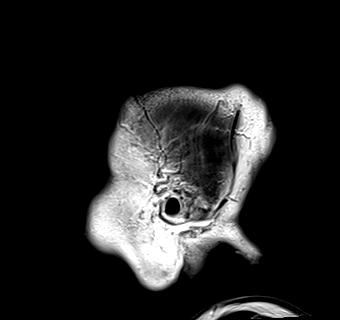

[Series 21: T1 post-contrast · coronal · 5.0mm · 0.34mm/px · 2 of 32 slices shown (2 of 2)]
[im 1/32]
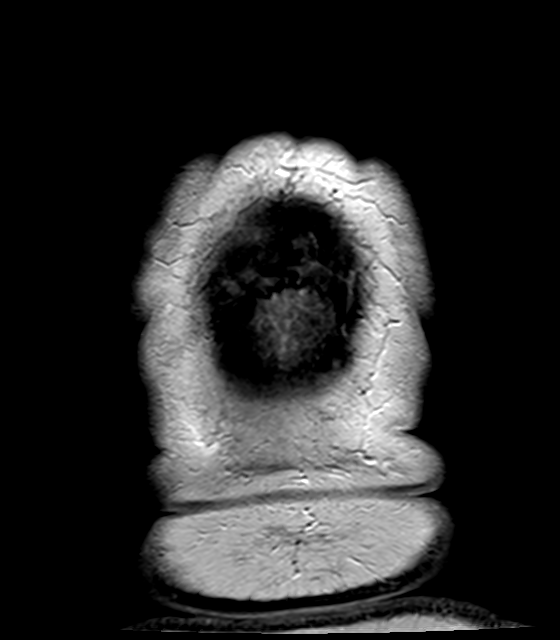
[im 32/32]
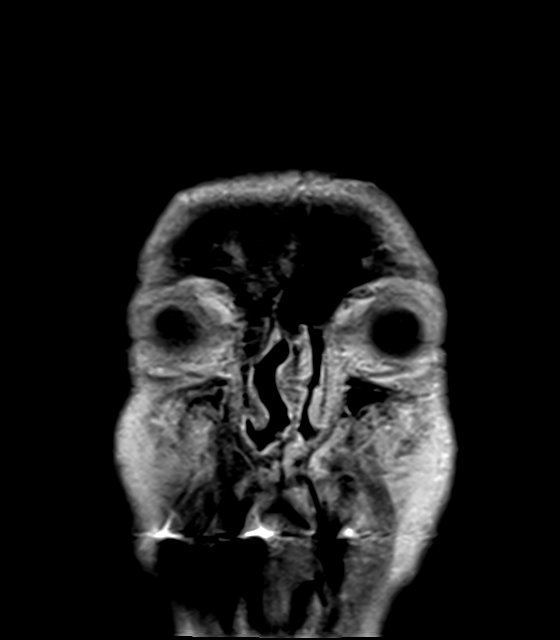

[39 of 48 positions shown; findings below may reference images not displayed]

FINDINGS: Brain: No acute infarction, hemorrhage, hydrocephalus, extra-axial
collection or mass lesion. Redemonstrated encephalomalacia in the
bilateral frontal lobes, subjacent to the craniotomy flap, with
sequela of remote infarcts in the right corona radiata and left
caudate. T2 hyperintense signal in the periventricular white matter,
likely the sequela of chronic small vessel ischemic disease. No
abnormal enhancement.

Vascular: Normal flow voids.

Skull and upper cervical spine: Prior bifrontal cranioplasty.
Otherwise normal marrow signal.

Sinuses/Orbits: Postoperative changes in the paranasal sinus with
diffuse sinus mucosal thickening, worse in the right maxillary sinus
and posterior ethmoid air cells.

Other: Fluid in the bilateral mastoid air cells.
IMPRESSION: No acute intracranial process.

## 2021-01-08 MED ORDER — KETOROLAC TROMETHAMINE 15 MG/ML IJ SOLN
15.0000 mg | Freq: Four times a day (QID) | INTRAMUSCULAR | Status: DC | PRN
Start: 2021-01-08 — End: 2021-01-09
  Administered 2021-01-08 – 2021-01-09 (×3): 15 mg via INTRAVENOUS
  Filled 2021-01-08 (×4): qty 1

## 2021-01-08 MED ORDER — FENTANYL CITRATE PF 50 MCG/ML IJ SOSY
25.0000 ug | PREFILLED_SYRINGE | Freq: Once | INTRAMUSCULAR | Status: AC
Start: 2021-01-08 — End: 2021-01-08
  Administered 2021-01-08: 25 ug via INTRAVENOUS
  Filled 2021-01-08: qty 1

## 2021-01-08 MED ORDER — LOSARTAN POTASSIUM 25 MG PO TABS
25.0000 mg | ORAL_TABLET | Freq: Every day | ORAL | Status: DC
  Administered 2021-01-08 – 2021-01-09 (×2): 25 mg via ORAL
  Filled 2021-01-08 (×2): qty 1

## 2021-01-08 MED ORDER — MAGNESIUM OXIDE -MG SUPPLEMENT 400 (240 MG) MG PO TABS
400.0000 mg | ORAL_TABLET | Freq: Every day | ORAL | Status: DC
  Administered 2021-01-08 – 2021-01-09 (×2): 400 mg via ORAL
  Filled 2021-01-08 (×2): qty 1

## 2021-01-08 MED ORDER — PERFLUTREN LIPID MICROSPHERE
1.0000 mL | INTRAVENOUS | Status: AC | PRN
Start: 2021-01-08 — End: 2021-01-08
  Administered 2021-01-08: 2 mL via INTRAVENOUS
  Filled 2021-01-08: qty 10

## 2021-01-08 MED ORDER — ONDANSETRON HCL 4 MG/2ML IJ SOLN: 4.0000 mg | Freq: Four times a day (QID) | INTRAMUSCULAR | Status: DC | PRN

## 2021-01-08 MED ORDER — LORAZEPAM 2 MG/ML IJ SOLN: 0.5000 mg | INTRAMUSCULAR | Status: DC | PRN

## 2021-01-08 MED ORDER — HYDROXYZINE HCL 25 MG PO TABS: 25.0000 mg | ORAL_TABLET | Freq: Three times a day (TID) | ORAL | Status: DC | PRN

## 2021-01-08 MED ORDER — METOPROLOL TARTRATE 12.5 MG HALF TABLET
12.5000 mg | ORAL_TABLET | Freq: Two times a day (BID) | ORAL | Status: DC
  Administered 2021-01-08 – 2021-01-09 (×3): 12.5 mg via ORAL
  Filled 2021-01-08 (×3): qty 1

## 2021-01-08 MED ORDER — ATORVASTATIN CALCIUM 80 MG PO TABS
80.0000 mg | ORAL_TABLET | Freq: Every day | ORAL | Status: DC
  Administered 2021-01-08 – 2021-01-09 (×2): 80 mg via ORAL
  Filled 2021-01-08 (×2): qty 1

## 2021-01-08 MED ORDER — DIPHENHYDRAMINE HCL 50 MG/ML IJ SOLN
25.0000 mg | Freq: Once | INTRAMUSCULAR | Status: AC
  Administered 2021-01-08: 25 mg via INTRAVENOUS
  Filled 2021-01-08: qty 1

## 2021-01-08 MED ORDER — ONDANSETRON HCL 4 MG/2ML IJ SOLN
4.0000 mg | Freq: Once | INTRAMUSCULAR | Status: AC
  Administered 2021-01-08: 4 mg via INTRAVENOUS
  Filled 2021-01-08: qty 2

## 2021-01-08 MED ORDER — QUETIAPINE FUMARATE 100 MG PO TABS
100.0000 mg | ORAL_TABLET | Freq: Every day | ORAL | Status: DC
  Administered 2021-01-08: 100 mg via ORAL
  Filled 2021-01-08: qty 1

## 2021-01-08 MED ORDER — LATANOPROST 0.005 % OP SOLN
1.0000 [drp] | Freq: Every day | OPHTHALMIC | Status: DC
  Administered 2021-01-08: 1 [drp] via OPHTHALMIC
  Filled 2021-01-08: qty 2.5

## 2021-01-08 MED ORDER — HYDROXYZINE HCL 25 MG PO TABS
50.0000 mg | ORAL_TABLET | Freq: Two times a day (BID) | ORAL | Status: DC | PRN
  Administered 2021-01-08: 50 mg via ORAL
  Filled 2021-01-08: qty 2

## 2021-01-08 MED ORDER — ASPIRIN EC 81 MG PO TBEC
81.0000 mg | DELAYED_RELEASE_TABLET | Freq: Every day | ORAL | Status: DC
  Administered 2021-01-08 – 2021-01-09 (×2): 81 mg via ORAL
  Filled 2021-01-08 (×2): qty 1

## 2021-01-08 MED ORDER — ENOXAPARIN SODIUM 40 MG/0.4ML IJ SOSY
40.0000 mg | PREFILLED_SYRINGE | INTRAMUSCULAR | Status: DC
  Administered 2021-01-08 – 2021-01-09 (×2): 40 mg via SUBCUTANEOUS
  Filled 2021-01-08 (×2): qty 0.4

## 2021-01-08 MED ORDER — MORPHINE SULFATE (PF) 2 MG/ML IV SOLN
2.0000 mg | Freq: Once | INTRAVENOUS | Status: AC
  Administered 2021-01-08: 2 mg via INTRAVENOUS
  Filled 2021-01-08: qty 1

## 2021-01-08 MED ORDER — CLOPIDOGREL BISULFATE 75 MG PO TABS
75.0000 mg | ORAL_TABLET | Freq: Every day | ORAL | Status: DC
  Administered 2021-01-08 – 2021-01-09 (×2): 75 mg via ORAL
  Filled 2021-01-08 (×2): qty 1

## 2021-01-08 MED ORDER — HYDROXYZINE PAMOATE 50 MG PO CAPS
50.0000 mg | ORAL_CAPSULE | Freq: Two times a day (BID) | ORAL | Status: DC | PRN
  Filled 2021-01-08: qty 1

## 2021-01-08 MED ORDER — METOCLOPRAMIDE HCL 5 MG/ML IJ SOLN
5.0000 mg | Freq: Once | INTRAMUSCULAR | Status: AC
  Administered 2021-01-08: 5 mg via INTRAVENOUS
  Filled 2021-01-08: qty 2

## 2021-01-08 MED ORDER — ESCITALOPRAM OXALATE 10 MG PO TABS
20.0000 mg | ORAL_TABLET | Freq: Every day | ORAL | Status: DC
  Administered 2021-01-08 – 2021-01-09 (×2): 20 mg via ORAL
  Filled 2021-01-08 (×2): qty 2

## 2021-01-08 MED ORDER — PRIMIDONE 50 MG PO TABS
100.0000 mg | ORAL_TABLET | Freq: Two times a day (BID) | ORAL | Status: DC
  Administered 2021-01-08 – 2021-01-09 (×3): 100 mg via ORAL
  Filled 2021-01-08 (×4): qty 2

## 2021-01-08 MED ORDER — ROPINIROLE HCL 1 MG PO TABS
3.0000 mg | ORAL_TABLET | Freq: Every day | ORAL | Status: DC
  Administered 2021-01-08: 3 mg via ORAL
  Filled 2021-01-08: qty 3

## 2021-01-08 MED ORDER — GADOBUTROL 1 MMOL/ML IV SOLN
10.0000 mL | Freq: Once | INTRAVENOUS | Status: AC | PRN
  Administered 2021-01-08: 10 mL via INTRAVENOUS

## 2021-01-08 MED ORDER — RANOLAZINE ER 500 MG PO TB12
1000.0000 mg | ORAL_TABLET | Freq: Two times a day (BID) | ORAL | Status: DC
  Administered 2021-01-08 – 2021-01-09 (×3): 1000 mg via ORAL
  Filled 2021-01-08 (×4): qty 2

## 2021-01-08 MED ORDER — KETOROLAC TROMETHAMINE 15 MG/ML IJ SOLN
15.0000 mg | Freq: Once | INTRAMUSCULAR | Status: AC
  Administered 2021-01-08: 15 mg via INTRAVENOUS
  Filled 2021-01-08: qty 1

## 2021-01-08 MED ORDER — SODIUM CHLORIDE 0.9 % IV SOLN: INTRAVENOUS | Status: DC

## 2021-01-08 MED ORDER — ONDANSETRON HCL 4 MG PO TABS: 4.0000 mg | ORAL_TABLET | Freq: Four times a day (QID) | ORAL | Status: DC | PRN

## 2021-01-08 MED ORDER — MORPHINE SULFATE (PF) 4 MG/ML IV SOLN
4.0000 mg | Freq: Once | INTRAVENOUS | Status: AC
  Administered 2021-01-08: 4 mg via INTRAVENOUS
  Filled 2021-01-08: qty 1

## 2021-01-08 MED ORDER — ALLOPURINOL 100 MG PO TABS
100.0000 mg | ORAL_TABLET | Freq: Every day | ORAL | Status: DC
  Administered 2021-01-08 – 2021-01-09 (×2): 100 mg via ORAL
  Filled 2021-01-08 (×2): qty 1

## 2021-01-08 MED ORDER — PANTOPRAZOLE SODIUM 40 MG PO TBEC
40.0000 mg | DELAYED_RELEASE_TABLET | Freq: Two times a day (BID) | ORAL | Status: DC
  Administered 2021-01-08 – 2021-01-09 (×3): 40 mg via ORAL
  Filled 2021-01-08 (×3): qty 1

## 2021-01-08 NOTE — Evaluation (Signed)
Occupational Therapy Evaluation Patient Details Name: Mark Thomas MRN: [id number] DOB: [birth year] Today's Date: 01/08/2021    History of Present Illness 70 year old white male with a prior history of atrial fibrillation, history of aortic stenosis, history of prior stroke, history of permanent pacemaker, status post watchman device status post ASD closure device, history of residual right-sided weakness from a prior stroke, history of restless leg syndrome, history of essential tremor who presented to [facility] ER today with complaints of headache.  Patient complained to his wife earlier in the morning about headache.  He was given some Tylenol and told to take a nap.  When he woke this afternoon around 4:00, he noted worsening right-sided weakness.  He states he had some numbness in his face.  His wife was concerned about a possible stroke and brought him to the ER.   Clinical Impression   Pt. Was cooperative with therapy. Pt. Had headache and did not sleep well. Pt. Was agreeable to OT and was able to follow directions. Pt. Was able to sit eob for ADLs and to stand with assist. Pt. Was able to amb 5 feet in room and then asked to turn around secondary to being weak. Pt. Returned to bed. Acute OT to follow.     Follow Up Recommendations  No OT follow up (Pt. to start outpatient PT next week.)    Equipment Recommendations  None recommended by OT    Recommendations for Other Services       Precautions / Restrictions Precautions Precautions: Fall Precaution Comments: Pt. reports that he has 20 falls in the past 6 months. Restrictions Weight Bearing Restrictions: No      Mobility Bed Mobility Overal bed mobility: Modified Independent                  Transfers Overall transfer level: Needs assistance Equipment used: Rolling walker (2 wheeled) Transfers: Sit to/from [facility] Sit to Stand: Min guard Stand pivot transfers: Min guard       General  transfer comment: cues for proper hand placement.    Balance Overall balance assessment: History of Falls                                         ADL either performed or assessed with clinical judgement   ADL Overall ADL's : Needs assistance/impaired Eating/Feeding: Set up;Sitting   Grooming: Wash/dry hands;Wash/dry face;Min guard;Sitting   Upper Body Bathing: Minimal assistance;Sitting   Lower Body Bathing: Minimal assistance;Sit to/from stand   Upper Body Dressing : Minimal assistance;Sitting   Lower Body Dressing: Moderate assistance;Sit to/from stand   Toilet Transfer: RW;BSC;Min guard   Toileting- Clothing Manipulation and Hygiene: Minimal assistance;Sit to/from stand       Functional mobility during ADLs: Rolling walker;Min guard (limited by weakness was not able to amb to the bathroom. pt. amb 5 feet and had to turn around) General ADL Comments: Pt. is having weakness and severe headache and may have increased performance at latet session.     Vision Baseline Vision/History: 1 Wears glasses Ability to See in Adequate Light: 0 Adequate Patient Visual Report: No change from baseline Vision Assessment?: No apparent visual deficits     Perception     Praxis      Pertinent Vitals/Pain Pain Assessment: 0-10 Pain Score: 8  Pain Location: headache Pain Descriptors / Indicators: Stabbing Pain Intervention(s): Premedicated before session;Limited activity  within patient's tolerance     Hand Dominance Right   Extremity/Trunk Assessment Upper Extremity Assessment Upper Extremity Assessment: RUE deficits/detail RUE Deficits / Details: Pt. is awaiting rotator cuff sx on r. Pt. states rom is the same as before. RUE:  (distal rom is wnl. coordination slowed as it has been secondary to previous cva. sensation intact.) RUE Coordination: decreased fine motor (at baseline. pt. 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   General Comments       Exercises     Shoulder Instructions      Home Living Family/patient expects to be discharged to:: Private residence Living Arrangements: Spouse/significant other Available Help at Discharge: Available 24 hours/day Type of Home: Apartment Home Access: Level entry     Home Layout: One level     Bathroom Shower/Tub: Teacher, early years/pre: Handicapped height     Home Equipment: Shower seat;Grab bars - tub/shower;Walker - 4 wheels          Prior Functioning/Environment Level of Independence: Needs assistance    ADL's / Homemaking Assistance Needed: Pt. wife would assist wtih shower transfer and pt. was Mod I with remaining adls.            OT Problem List: Decreased activity tolerance;Impaired balance (sitting and/or standing);Decreased safety awareness      OT Treatment/Interventions: Self-care/ADL training;DME and/or AE instruction;Therapeutic activities;Patient/family education    OT Goals(Current goals can be found in the care plan section) Acute Rehab OT Goals Patient Stated Goal: to feel better and go home. OT Goal Formulation: With patient Time For Goal Achievement: 01/22/21 Potential to Achieve Goals: Good  OT Frequency: Min 2X/week   Barriers to D/C:            Co-evaluation              AM-PAC OT "6 Clicks" Daily Activity     Outcome Measure Help from another person eating meals?: A Little Help from another person taking care of  personal grooming?: A Little Help from another person toileting, which includes using toliet, bedpan, or urinal?: A Little Help from another person bathing (including washing, rinsing, drying)?: A Little Help from another person to put on and taking off regular upper body clothing?: A Little Help from another person to put on and taking off regular lower body clothing?: A Lot 6 Click Score: 17   End of Session Equipment Utilized During Treatment: Rolling walker;Gait belt Nurse Communication:  (ok therapy)  Activity Tolerance: No increased pain;Patient limited by fatigue Patient left: in bed;with call bell/phone within reach;with bed alarm set;with family/visitor present  OT Visit Diagnosis: Unsteadiness on feet (R26.81);Repeated falls (R29.6);History of falling (Z91.81);Muscle weakness (generalized) (M62.81)                Time: 5498-2641 OT Time Calculation (min): 28 min Charges:  OT General Charges $OT Visit: 1 Visit OT Evaluation $OT Eval Moderate  Complexity: [place] OT/L   Hetty Linhart 01/08/2021, 12:58 PM

## 2021-01-08 NOTE — Progress Notes (Signed)
*  PRELIMINARY RESULTS* Echocardiogram 2D Echocardiogram has been performed.  Luisa Hart RDCS 01/08/2021, 10:18 AM

## 2021-01-08 NOTE — Assessment & Plan Note (Addendum)
Check echo. Pt follows with [facility] cardiology.  Last echo at [facility] 06-2020 showed: 1. The left ventricle is normal in size with mildly increased wall  thickness.  2. The left ventricular systolic function is normal, LVEF is visually  estimated at 65-70%.  3. The right ventricle is normal in size, with normal systolic function.  4. Closure of atrial septal defect with 18 mm Cribriform device. No shunt  seen by color doppler.  5. There is moderateaortic valve stenosis with a peak velocity of 3.0 m/s,  mean gradient of 22 mmHg, and aortic valve area of 1.2 cm2.  6. Doppler velocity index: 0.33.  7. There is mild to moderate aortic regurgitation.  8. There is no pulmonary hypertension, estimated pulmonary artery systolic  pressure is 39 mmHg.

## 2021-01-08 NOTE — Progress Notes (Signed)
SLP Cancellation Note  Patient Details Name: Mark Thomas MRN: [id number] DOB: 27-Oct-1950   Cancelled treatment:       Reason Eval/Treat Not Completed: SLP screened. RN and pt report no concerns with swallow function at this time. AM meal and pills were taken whole without overt s/sx of aspiration per RN/pt. He passed [place] (01/07/21). No acute needs identified at this time, will sign off.    Ellwood Dense, [place], [place] Acute Rehabilitation Services Office Number: [phone]  Acie Fredrickson 01/08/2021, 10:16 AM

## 2021-01-08 NOTE — Plan of Care (Signed)
  Problem: Education: Goal: Knowledge of General Education information will improve Description: Including pain rating scale, medication(s)/side effects and non-pharmacologic comfort measures Outcome: Progressing   Problem: Health Behavior/Discharge Planning: Goal: Ability to manage health-related needs will improve Outcome: Progressing   Problem: Clinical Measurements: Goal: Ability to maintain clinical measurements within normal limits will improve Outcome: Progressing Goal: Will remain free from infection Outcome: Progressing Goal: Diagnostic test results will improve Outcome: Progressing Goal: Respiratory complications will improve Outcome: Progressing Goal: Cardiovascular complication will be avoided Outcome: Progressing   Problem: Activity: Goal: Risk for activity intolerance will decrease Outcome: Progressing   Problem: Nutrition: Goal: Adequate nutrition will be maintained Outcome: Progressing   Problem: Coping: Goal: Level of anxiety will decrease Outcome: Progressing   Problem: Elimination: Goal: Will not experience complications related to bowel motility Outcome: Progressing Goal: Will not experience complications related to urinary retention Outcome: Progressing   Problem: Pain Managment: Goal: General experience of comfort will improve Outcome: Progressing   Problem: Safety: Goal: Ability to remain free from injury will improve Outcome: Progressing   Problem: Skin Integrity: Goal: Risk for impaired skin integrity will decrease Outcome: Progressing   Problem: Education: Goal: Knowledge of disease or condition will improve Outcome: Progressing Goal: Knowledge of secondary prevention will improve Outcome: Progressing Goal: Knowledge of patient specific risk factors addressed and post discharge goals established will improve Outcome: Progressing Goal: Individualized Educational Video(s) Outcome: Progressing   Problem: Coping: Goal: Will verbalize  positive feelings about self Outcome: Progressing Goal: Will identify appropriate support needs Outcome: Progressing   Problem: Health Behavior/Discharge Planning: Goal: Ability to manage health-related needs will improve Outcome: Progressing   Problem: Self-Care: Goal: Ability to participate in self-care as condition permits will improve Outcome: Progressing Goal: Verbalization of feelings and concerns over difficulty with self-care will improve Outcome: Progressing Goal: Ability to communicate needs accurately will improve Outcome: Progressing   Problem: Nutrition: Goal: Risk of aspiration will decrease Outcome: Progressing Goal: Dietary intake will improve Outcome: Progressing   Problem: Intracerebral Hemorrhage Tissue Perfusion: Goal: Complications of Intracerebral Hemorrhage will be minimized Outcome: Progressing   Problem: Ischemic Stroke/TIA Tissue Perfusion: Goal: Complications of ischemic stroke/TIA will be minimized Outcome: Progressing   

## 2021-01-08 NOTE — Assessment & Plan Note (Signed)
Continue lopressor. On ASA/plavix. S/p watchman. Was placed due to frequent falls.

## 2021-01-08 NOTE — Assessment & Plan Note (Signed)
Neurology wants permissive HTN until MRI brain. Restart HTN meds if MRI brain is negative for CVA.

## 2021-01-08 NOTE — Progress Notes (Addendum)
STROKE TEAM PROGRESS NOTE   ATTENDING NOTE: I reviewed above note and agree with the assessment and plan. Pt was seen and examined.   70 year old male with history of strokes x3 with residual mild right-sided weakness, A. fib status post ablation x2 and watchman device placement in 03/2019 on ASA and plavix, ASD s/p closure, SSS status post pacer, CAD status post stenting, history of alcohol abuse and opioid addiction admitted for headache, blurry vision, dizziness and right facial droop.  Per wife, patient yesterday around 84 PM after shower had acute onset left occipital headache, severe, sharp pain.  At that time wife checked the patient no neurodeficit found.  She gave him 2 Tylenol and the patient went to sleep.  4 PM, patient woke up again screaming for left occipital severe headache, in tears, wife found patient had slight right facial droop, patient complaining of dizziness, blurry vision.  EMS was called, on their examination, patient had mild right-sided weakness.  Patient was sent to ER for evaluation.  CT no acute abnormality, old left BG infarct and bilateral frontal encephalomalacia with prior craniotomy.  CTA head and neck right ICA 50% stenosis, severe right M2 and moderate left V4 stenosis.  CTP negative.  MRI today no acute abnormality, no infarct.  EF 65 to 70%, UDS positive for barbiturates.  LDL 54, A1c 6.0.  Creatinine 1.25-> 1.32.  Per wife, patient had severe extensive fungal sinusitis bilaterally in 1980s status post bifrontal craniotomy by ENT in [facility] and treated with antifungal medication.  Patient had a further stroke several years ago with left " cranial nerve paralyzed", per chart seem to be left INO.  Second stroke in 10/2019 with right facial droop, slurred speech, right leg dragging as well as severe headache.  Slurred stroke on 05/03/2020 with dizziness, slurred speech, right facial droop, frontal headache.  All this was treated in [facility]  Patient  just moved to [place] 2 months ago.  He has appointment with [facility] Dr. April Manson on 02/08/21.  On my exam, patient awake alert, orientated x3, complaining of severe bilateral occipital headache.  No aphasia, fluent language, follows simple commands, able to name and repeat.  Visual fields full, no gaze palsy, facial symmetrical.  Left upper extremity 5/5, right upper extremity limited shoulder ROM due to chronic shoulder injury.  Right finger grip 4/5, chronic from previous stroke.  Bilateral lower extremity 4/5 symmetrical.  Sensation decreased on the right face and right arm.  Finger-to-nose grossly intact.  Bilateral tenderness on occipital nerve palpation. Restless legs on observation.   Etiology for patient symptoms likely bilateral occipital neuralgia versus complicated headache, less likely TIA or seizure.  Discussed with patient and wife, recommend bilateral occipital nerve block, however patient refused at this time.  He has received morphine 2+4 mg this am, received Toradol and hydroxyzine in a.m.  Not effective.  He requested fentanyl IV for headache as it had worked well with him in the past.  I discussed with Dr. Wyline Copas primary team, given his history of opiate addiction, will try one-time dose of fentanyl 66mg tonight. Dr. DMerlene Laughterwill follow up in am for further management.   Continue home aspirin and Plavix as well as Lipitor 80.  For detailed assessment and plan, please refer to above as I have made changes wherever appropriate.   Patient continues to have severe occipital headache. I discussed with Dr. CWyline Copas I have spent  35 minutes in total face-to-face time with the patient, more than 50% of  which was spent in counseling and coordination of care, reviewing test results, images and medication, and discussing the diagnosis, treatment plan and potential prognosis. This patient's care requiresreview of multiple databases, neurological assessment, discussion with family, other specialists and  medical decision making of high complexity. I had long discussion with patient and wife at bedside, updated pt current condition, treatment plan and potential prognosis, and answered all the questions.  They expressed understanding and appreciation.   Rosalin Hawking, MD PhD Stroke Neurology 01/08/2021 6:10 PM    INTERVAL HISTORY No one is at the bedside at time of this exam. 70 year old male with multiple medical comorbidities presenting with severe headache, dizziness, and right cheek numbness. His wife is at the bedside. She reports persistent left occipital headaches but she is concerned about using too many narcotic medications given abuse history.   Vitals:   01/08/21 0108 01/08/21 0308 01/08/21 0409 01/08/21 0508  BP: (!) 176/105 (!) 151/89  (!) 151/99  Pulse: 74 73  76  Resp: _0 Temp: 98.6 F (37 C) 98.5 F (36.9 C)  98.7 F (37.1 C)  TempSrc: Oral Oral  Oral  SpO2: 96% 95%  98%  Weight:   109 kg   Height:       CBC:  Recent Labs  Lab 01/07/21 1750 01/08/21 0318  WBC 6.9 6.9  NEUTROABS 4.0 3.4  HGB 13.5 12.8*  HCT 40.8 38.5*  MCV 95.3 95.1  PLT 206 409   Basic Metabolic Panel:  Recent Labs  Lab 01/07/21 1750 01/08/21 0318  NA 134* 133*  K 4.9 4.1  CL 99 100  CO2 25 23  GLUCOSE 117* 125*  BUN 27* 24*  CREATININE 1.25* 1.32*  CALCIUM 9.5 9.3     Lipid Panel: No results for input(s): CHOL, TRIG, HDL, CHOLHDL, VLDL, LDLCALC in the last 168 hours.  HgbA1c:  Recent Labs  Lab 01/08/21 0138  HGBA1C 6.0*   Urine Drug Screen:  Recent Labs  Lab 01/07/21 1856  LABOPIA NONE DETECTED  COCAINSCRNUR NONE DETECTED  LABBENZ NONE DETECTED  AMPHETMU NONE DETECTED  THCU NONE DETECTED  LABBARB POSITIVE*    Alcohol Level  Recent Labs  Lab 01/07/21 1750  ETH 11*    IMAGING past 24 hours CT CEREBRAL PERFUSION W CONTRAST  Result Date: 01/08/2021 CLINICAL DATA:  Initial evaluation for acute stroke. EXAM: CT PERFUSION BRAIN TECHNIQUE: Multiphase CT imaging  of the brain was performed following IV bolus contrast injection. Subsequent parametric perfusion maps were calculated using RAPID software. CONTRAST:  151m OMNIPAQUE IOHEXOL 350 MG/ML SOLN COMPARISON:  Comparison made with prior head CT and CTA from 01/07/2021. FINDINGS: Please note that there was technical difficulty of this exam, and this study did not become available for review for interpretation until 1:20 a.m. on 01/08/2021 due to technical difficulty. CT Brain Perfusion Findings: CBF (<30%) Volume: 046mPerfusion (Tmax>6.0s) volume: 22735mismatch Volume: 103m66mPECTS on noncontrast CT Head: 10 at 5:47 p.m. on 01/07/2021. Infarct Core: 0 mL Infarction Location:Negative. IMPRESSION: 1. Negative CT perfusion. No evidence for acute ischemia or other perfusion abnormality. Apparent symmetric areas of delayed perfusion involving both cerebral hemispheres are felt to be consistent with artifact. 2. Please note that there was technical difficulty with this exam, and this study did not become available for review or interpretation until 1:20 a.m. on 01/08/21. These results were discussed with Dr. ArorRory Percy1:23 am on 01/08/2021 by Dr. Ben Acquanetta Chainectronically Signed   By: BenjMarland Kitchen  Jeannine Boga M.D.   On: 01/08/2021 01:24   CT HEAD CODE STROKE WO CONTRAST  Result Date: 01/07/2021 CLINICAL DATA:  Code stroke. Neuro deficit, acute, stroke suspected. EXAM: CT HEAD WITHOUT CONTRAST TECHNIQUE: Contiguous axial images were obtained from the base of the skull through the vertex without intravenous contrast. COMPARISON:  Noncontrast head CT 12/27/2020. FINDINGS: Brain: Redemonstrated foci of chronic encephalomalacia within the anterior frontal lobes bilaterally. Background mild generalized cerebral and cerebellar atrophy. Unchanged focus of chronic small-vessel ischemia within the right corona radiata (for instance as seen on series 4, image 21). Background patchy and ill-defined hypoattenuation within the cerebral  white matter, nonspecific but compatible with chronic small vessel ischemic disease. Redemonstrated chronic lacunar infarct within the left caudate nucleus. There is no acute intracranial hemorrhage. No demarcated cortical infarct. No extra-axial fluid collection. No evidence of an intracranial mass. No midline shift. Vascular: No hyperdense vessel.  Atherosclerotic calcifications. Skull: Bifrontal cranioplasty. Sinuses/Orbits: Visualized orbits show no acute finding. Postsurgical appearance of the paranasal sinuses. Paranasal sinus disease. Most notably, there is extensive opacification of the posterior left ethmoid air cells, near complete opacification of the right maxillary sinus and moderate mucosal thickening within the left maxillary sinus. Other: Bilateral mastoid effusions. ASPECTS [facility] Stroke Program Early CT Score) - Ganglionic level infarction (caudate, lentiform nuclei, internal capsule, insula, M1-M3 cortex): 7 - Supraganglionic infarction (M4-M6 cortex): 3 Total score (0-10 with 10 being normal): 10 (when discounting chronic infarcts). These results were called by telephone at the time of interpretation on 01/07/2021 at 5:46 pm to provider MELANIE BELFI , who verbally acknowledged these results. IMPRESSION: No evidence of acute intracranial abnormality. Redemonstrated chronic encephalomalacia within the bilateral frontal lobes, likely posttraumatic. Unchanged focus of chronic small-vessel ischemia within the right frontal lobe white matter. Background chronic small-vessel ischemic changes within the cerebral white matter. Redemonstrated chronic left basal ganglia lacunar infarct. Mild generalized parenchymal atrophy. Paranasal sinus disease, as described. Bilateral mastoid effusions. Electronically Signed   By: Kellie Simmering D.O.   On: 01/07/2021 17:47   CT ANGIO HEAD NECK W WO CM (CODE STROKE)  Result Date: 01/07/2021 CLINICAL DATA:  Stroke code. Additional history provided by ER physician:  Right-sided deficits. EXAM: CT ANGIOGRAPHY HEAD AND NECK TECHNIQUE: Multidetector CT imaging of the head and neck was performed using the standard protocol during bolus administration of intravenous contrast. Multiplanar CT image reconstructions and MIPs were obtained to evaluate the vascular anatomy. Carotid stenosis measurements (when applicable) are obtained utilizing NASCET criteria, using the distal internal carotid diameter as the denominator. CONTRAST:  Administered contrast not known at this time. COMPARISON:  Noncontrast head CT performed earlier today 01/07/2021. FINDINGS: CTA NECK FINDINGS Aortic arch: Standard aortic branching. Atherosclerotic plaque within the visualized aortic arch and proximal major branch vessels of the neck. No hemodynamically significant innominate or proximal subclavian artery stenosis. Right carotid system: CCA and ICA patent within the neck. Calcified plaque within the carotid bifurcation and proximal ICA. Quantification of stenosis within the proximal right ICA is difficult due to irregularity of calcified plaque at this site. However, stenosis is estimated at up to 50%. Left carotid system: CCA and ICA patent within the neck without significant stenosis (50% or greater). Soft and calcified plaque within the carotid bifurcation and proximal ICA. Vertebral arteries: Vertebral arteries codominant and patent within the neck without hemodynamically significant stenosis. Skeleton: Cervical spondylosis. No acute bony abnormality or aggressive osseous lesion. Other neck: No neck mass or cervical lymphadenopathy. Upper chest: No consolidation within the imaged lung  apices. Review of the MIP images confirms the above findings CTA HEAD FINDINGS Anterior circulation: The intracranial internal carotid arteries are patent. Calcified plaque within both vessels with no more than mild stenosis. The M1 middle cerebral arteries are patent. No M2 proximal branch occlusion is identified.  Atherosclerotic irregularity of the M2 and more distal MCA vessels bilaterally. Most notably, there are multifocal severe stenoses within a mid M2 right MCA vessel and a severe stenosis within a distal M2 right MCA vessel. The anterior cerebral arteries are patent. No intracranial aneurysm is identified. Posterior circulation: The intracranial vertebral arteries are patent. Calcified plaque within the proximal V4 left vertebral artery with moderate stenosis. The basilar artery is patent. The posterior cerebral arteries are patent. Posterior communicating arteries are hypoplastic or absent bilaterally. Venous sinuses: Within the limitations of contrast timing, no convincing thrombus. Anatomic variants: As described Review of the MIP images confirms the above findings No intracranial large vessel occlusion identified. These results were called by telephone at the time of interpretation on 01/07/2021 at 6:21 pm to provider Dr. Curly Shores, who verbally acknowledged these results. IMPRESSION: CTA neck: 1. The common carotid and internal carotid arteries are patent within the neck. Estimation of stenosis within the proximal right ICA is difficult due to irregularity of calcified plaque at this site, but is estimated at up to 50% stenosis. Mild soft and calcified plaque within the left carotid bifurcation and proximal left ICA with less than 50% stenosis at this site. 2. Vertebral arteries patent within the neck without hemodynamically significant stenosis. CTA head: 1. No intracranial large vessel occlusion identified. 2. Intracranial atherosclerotic disease with multifocal stenoses, most notably as follows. 3. Multifocal severe stenoses within a mid M2 right MCA vessel. 4. Severe stenosis within a distal M2 right MCA vessel. 5. Atherosclerotic plaque within the intracranial internal carotid arteries with no more than mild stenosis. 6. Moderate atherosclerotic narrowing of the proximal V4 left vertebral artery. Electronically  Signed   By: Kellie Simmering D.O.   On: 01/07/2021 18:30    PHYSICAL EXAM  .  ASSESSMENT/PLAN [name] is a 70 y.o. 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, vascular dementia, resolved atrial fibrillation no longer on anticoagulation s/p ablation x2 (07/28/2016, 11/20/2019) as well as watchman device placement (03/19/2019), atrial septal defect closure, sick sinus syndrome s/p MRI compatible pacemaker placement (01/2017), coronary artery disease s/p LAD stent, labile hypertension, concern for aortic stenosis, history of alcohol abuse (sober at this time), history of depression , who presented to this hospital with numerous complaints, including severe left occipital headache, right cheek numbness, right leg weakness, dysarthric speech with lip smacking, constant severe blinking  Possible occipital neuralgia vs. Complicated headache, less likely TIA   Code Stroke  CT head No acute abnormality.  Small vessel disease. Atrophy.  ASPECTS 10.     CTA neck :  1. The common carotid and internal carotid arteries are patent within the neck. Estimation of stenosis within the proximal right ICA is difficult due to irregularity of calcified plaque at this site, but is estimated at up to 50% stenosis. Mild soft and calcified plaque within the left carotid bifurcation and proximal left ICA with less than 50% stenosis at this site. 2. Vertebral arteries patent within the neck without hemodynamically significant stenosis.  CTA head: 1. No intracranial large vessel occlusion identified.  2. Intracranial atherosclerotic disease with multifocal stenoses, most notably as follows. 3. Multifocal  severe stenoses within a mid M2 right MCA vessel.  4. Severe stenosis within a distal M2 right MCA vessel. 5. Atherosclerotic plaque within the intracranial internal carotid arteries with no more than mild stenosis. 6. Moderate  atherosclerotic narrowing of the proximal V4 left vertebral artery.   CT perfusion  1. Negative CT perfusion. No evidence for acute ischemia or other perfusion abnormality. Apparent symmetric areas of delayed perfusion involving both cerebral hemispheres are felt to be consistent with artifact.   MRI BRAIN WITH AND WITHOUT CONTRAST - unremarkable  2D Echo done and results are pending    LDL pending HgbA1c 6.0 VTE prophylaxis - scd    Diet   Diet regular Room service appropriate? Yes; Fluid consistency: Thin   aspirin 81 mg daily and clopidogrel 75 mg daily prior to admission, now on home aspirin 81 mg daily and clopidogrel 75 mg daily.   Therapy recommendations:  pending Disposition:  pending  Hypertension Home meds:  losartan, metoprolol Stable Permissive hypertension (OK if < 220/120) but gradually normalize in 5-7 days Long-term BP goal normotensive  Hyperlipidemia Home meds:  atorvastatin 17m, resumed in hospital LDL pending, goal < 70 High intensity statin   Continue statin at discharge  HgbA1c 6.0, goal < 7.0 CBGs No results for input(s): GLUCAP in the last 72 hours.  SSI  Other Stroke Risk Factors  Advanced Age >/= 663  ETOH use, alcohol level 11, advised to drink no more than 1 2 drink(s) a day  Obesity, Body mass index is 31.7 kg/m., BMI >/= 30 associated with increased stroke risk, recommend weight loss, diet and exercise as appropriate   Hx stroke/TIA  Other Active Problems Chronic [place]ism in [place] states he has been alcohol-free for 3 years.  CKD (chronic kidney disease) stage 3, GFR 30-59 ml/min (HCC) Stable. Restless legs syndrome (RLS) Continue requip Paroxysmal atrial fibrillation (HCC) s/p Watchman device Continue lopressor. On ASA/plavix. S/p watchman. Was placed due to frequent falls. Vascular dementia ([place] Stable.   Hospital day # 0     To contact Stroke Continuity provider, please refer to [email or web address] After hours,  contact General Neurology

## 2021-01-08 NOTE — Assessment & Plan Note (Signed)
Pt states he has been alcohol-free for 3 years.

## 2021-01-08 NOTE — Assessment & Plan Note (Signed)
Admit to observation telemetry bed. Was seen by telestroke neurologist. Echo in AM. MRI brain. Pt has had recent MRI. Pt has medtronic pacemaker.  Pt states this is MRI compatible. Pt's wife has his pace maker card. Lipid panel already ordered. Will give one dose of IV morphine for his severe headache.  Allow for permissive hypertension per telestroke consult.

## 2021-01-08 NOTE — Progress Notes (Signed)
PT Cancellation Note  Patient Details Name: Mark Thomas MRN: [id number] DOB: 03-30-1951   Cancelled Treatment:    Reason Eval/Treat Not Completed: Patient at procedure or test/unavailable; will attempt again another day.   Reginia Naas 01/08/2021, 4:28 PM Magda Kiel, PT Acute Rehabilitation Services Pager:[phone] Office:[phone] 01/08/2021

## 2021-01-08 NOTE — H&P (Signed)
History and Physical    Mark Thomas WTU:[id number] DOB: October 21, 1950 DOA: 01/07/2021  PCP: Luetta Nutting, DO   Patient coming from: Home  I have personally briefly reviewed patient's old medical records in [place]  CC: headache HPI: 70 year old white male with a prior history of atrial fibrillation, history of aortic stenosis, history of prior stroke, history of permanent pacemaker, status post watchman device status post ASD closure device, history of residual right-sided weakness from a prior stroke, history of restless leg syndrome, history of essential tremor who presented to [facility] ER today with complaints of headache.  Patient complained to his wife earlier in the morning about headache.  He was given some Tylenol and told to take a nap.  When he woke this afternoon around 4:00, he noted worsening right-sided weakness.  He states he had some numbness in his face.  His wife was concerned about a possible stroke and brought him to the ER.  Patient states that he uses a walker to walk.  He has had numerous falls.  He recently moved to the [place] area from [place] region.  He recently establish himself with a new primary care physician.  He was previously followed by [facility] cardiology.  Patient seen in the ER.  He was outside the tPA window.  He seen by telestroke neurologist.  They want him to get an MRI.  Patient has MRI compatible pacemaker.  Patient transferred to [facility] for further work-up.  Patient continues to complain of a headache.  He states that this is quite severe.  He states last time he had headache this severe was when he had his last stroke in January of this year.  He states that Tylenol and the magnesium he was given for his headache in the ER did not help.  He is requesting something different.  He states the Toradol did not help.  There is concern by the neurologist for possible medication seeking behavior.   ED Course: pt had negative CT  head. No LVO. Telestroke consult called. Pt still had headache. IV toradol did not help. Pt transferred to [place] for further evaluation  Review of Systems:  Review of Systems  Constitutional: Negative.   HENT: Negative.    Eyes:  Positive for blurred vision.  Respiratory:  Negative for cough and hemoptysis.   Cardiovascular:  Negative for chest pain and palpitations.  Gastrointestinal: Negative.   Genitourinary: Negative.   Musculoskeletal:        Right shoulder pain due to frequent falls. Pt states he has torn rotator cuff  Skin: Negative.   Neurological:  Positive for weakness and headaches.       Pt states his right UE and right LE weakness have worsened today. Pt has severe posterior headache  Endo/Heme/Allergies: Negative.   Psychiatric/Behavioral: Negative.    All other systems reviewed and are negative.  Past Medical History:  Diagnosis Date   Anxiety    Coronary artery disease    Depression    Essential tremor    GERD (gastroesophageal reflux disease)    High cholesterol    Hypertension    Myocardial infarct (HCC)    Orthostatic hypotension    Stroke ([place])    Vascular dementia ([place])     Past Surgical History:  Procedure Laterality Date   APPENDECTOMY     CARDIAC SURGERY     CHOLECYSTECTOMY     HERNIA REPAIR     NASAL SINUS SURGERY  SHOULDER SURGERY       reports that he has never smoked. He has never been exposed to tobacco smoke. He has never used smokeless tobacco. He reports that he does not currently use alcohol. He reports that he does not use drugs.  Allergies  Allergen Reactions   Dilaudid [Hydromorphone] Other (See Comments)    Respiratory Arrest   Codeine    Penicillins    Phenobarbital     Family History  Problem Relation Age of Onset   Hypertension Mother    Stroke Mother    Stroke Father    Hypertension Father     Prior to Admission medications   Medication Sig Start Date End Date Taking? Authorizing Provider  albuterol  (VENTOLIN HFA) 108 (90 Base) MCG/ACT inhaler Inhale into the lungs.    [provider]  allopurinol (ZYLOPRIM) 100 MG tablet Take 100 mg by mouth daily. 09/22/20   [provider]  aspirin 81 MG EC tablet Take 1 tablet by mouth daily. 05/05/14   [provider]  atorvastatin (LIPITOR) 80 MG tablet Take 80 mg by mouth daily. 11/18/20   [provider]  clopidogrel (PLAVIX) 75 MG tablet Take 75 mg by mouth daily. 11/18/20   [provider]  escitalopram (LEXAPRO) 20 MG tablet Take 20 mg by mouth daily. 11/03/20   [provider]  ferrous sulfate 325 (65 FE) MG EC tablet Take by mouth. 10/02/20 12/31/20  [provider]  hydrOXYzine (VISTARIL) 50 MG capsule Take 1 capsule (50 mg total) by mouth 2 (two) times daily as needed for anxiety. 12/24/20   Luetta Nutting, DO  ketoconazole (NIZORAL) 2 % cream Apply topically 2 (two) times daily. 12/16/20   [provider]  latanoprost (XALATAN) 0.005 % ophthalmic solution 1 drop at bedtime. 12/07/20   [provider]  losartan (COZAAR) 25 MG tablet Take 25 mg by mouth daily. Take as need when blood pressure is over 160 09/26/20   [provider]  magnesium oxide (MAG-OX) 400 MG tablet Take by mouth.    [provider]  metoprolol tartrate (LOPRESSOR) 25 MG tablet Take 12.5 mg by mouth 2 (two) times daily. Take 1/2 tablet twice daily 11/18/20   [provider]  nitroGLYCERIN (NITROLINGUAL) 0.4 MG/SPRAY spray 1 SPRAY UNDER THE TONGUE AS DIRECTED 05/05/14   [provider]  pantoprazole (PROTONIX) 40 MG tablet Take 40 mg by mouth 2 (two) times daily. 11/18/20   [provider]  potassium citrate (UROCIT-K) 10 MEQ (1080 MG) SR tablet Take by mouth. 09/22/20   [provider]  primidone (MYSOLINE) 50 MG tablet Take 100 mg by mouth 2 (two) times daily. 09/26/20   [provider]  QUEtiapine (SEROQUEL) 50 MG tablet Take 100 mg by mouth at bedtime.  Take 2 tabs at night 12/14/20   [provider]  ranolazine (RANEXA) 1000 MG SR tablet Take 1,000 mg by mouth 2 (two) times daily. 11/18/20   [provider]  rOPINIRole (REQUIP) 3 MG tablet Take 3 mg by mouth at bedtime. 10/13/20   [provider]    Physical Exam: Vitals:   01/07/21 1855 01/07/21 2115 01/07/21 2308 01/08/21 0000  BP: (!) 148/90 (!) 159/84 (!) 185/97   Pulse: 76 78 75   Resp: _0 Temp:   98.7 F (37.1 C)   TempSrc:   Oral   SpO2: 96% 99% 98%   Weight:    109 kg  Height:  _0  (1.854 m)    Physical Exam Vitals and nursing note reviewed.  Constitutional:      General: He is not in acute distress.    Appearance: He is normal weight. He is not ill-appearing, toxic-appearing or diaphoretic.  HENT:     Head: Normocephalic and atraumatic.     Nose: Nose normal. No rhinorrhea.  Eyes:     General:        Right eye: No discharge.        Left eye: No discharge.  Cardiovascular:     Rate and Rhythm: Normal rate and regular rhythm.     Pulses: Normal pulses.     Heart sounds: Murmur heard.  Systolic murmur is present with a grade of 3/6.     Comments: Squeaking murmur LLSB Pulmonary:     Effort: Pulmonary effort is normal. No respiratory distress.     Breath sounds: No wheezing or rales.  Abdominal:     General: Abdomen is flat. Bowel sounds are normal. There is no distension.     Palpations: Abdomen is soft.     Tenderness: There is no guarding.  Musculoskeletal:     Right lower leg: No edema.     Left lower leg: No edema.  Skin:    General: Skin is warm and dry.     Capillary Refill: Capillary refill takes less than 2 seconds.  Neurological:     Mental Status: He is alert and oriented to person, place, and time.     Comments: Pt unable to lift right leg against gravity. Pt able to move right hand and wrist. Pt unable to lift right UE off bed due to complaints of pain in his right shoulder.  Pt able to wiggle his toes on  right foot.  Left side normal strength.  Right UE tremor     Labs on Admission: I have personally reviewed following labs and imaging studies  CBC: Recent Labs  Lab 01/07/21 1750  WBC 6.9  NEUTROABS 4.0  HGB 13.5  HCT 40.8  MCV 95.3  PLT 952   Basic Metabolic Panel: Recent Labs  Lab 01/07/21 1750  NA 134*  K 4.9  CL 99  CO2 25  GLUCOSE 117*  BUN 27*  CREATININE 1.25*  CALCIUM 9.5   GFR: Estimated Creatinine Clearance: 73.2 mL/min (A) (by C-G formula based on SCr of 1.25 mg/dL (H)). Liver Function Tests: Recent Labs  Lab 01/07/21 1750  AST 37  ALT 31  ALKPHOS 60  BILITOT 0.5  PROT 7.2  ALBUMIN 4.2   No results for input(s): LIPASE, AMYLASE in the last 168 hours. No results for input(s): AMMONIA in the last 168 hours. Coagulation Profile: Recent Labs  Lab 01/07/21 1750  INR 1.0   Cardiac Enzymes: No results for input(s): CKTOTAL, CKMB, CKMBINDEX, TROPONINI in the last 168 hours. BNP (last 3 results) No results for input(s): PROBNP in the last 8760 hours. HbA1C: No results for input(s): HGBA1C in the last 72 hours. CBG: No results for input(s): GLUCAP in the last 168 hours. Lipid Profile: No results for input(s): CHOL, HDL, LDLCALC, TRIG, CHOLHDL, LDLDIRECT in the last 72 hours. Thyroid Function Tests: No results for input(s): TSH, T4TOTAL, FREET4, T3FREE, THYROIDAB in the last 72 hours. Anemia Panel: No results for input(s): VITAMINB12, FOLATE, FERRITIN, TIBC, IRON, RETICCTPCT in the last 72 hours. Urine analysis:    Component Value Date/Time   COLORURINE YELLOW 01/07/2021 1856   APPEARANCEUR CLEAR 01/07/2021 1856   LABSPEC >  1.046 (H) 01/07/2021 1856   PHURINE 7.0 01/07/2021 1856   GLUCOSEU NEGATIVE 01/07/2021 1856   HGBUR NEGATIVE 01/07/2021 1856   BILIRUBINUR NEGATIVE 01/07/2021 1856   KETONESUR NEGATIVE 01/07/2021 1856   PROTEINUR TRACE (A) 01/07/2021 1856   NITRITE NEGATIVE 01/07/2021 1856   LEUKOCYTESUR TRACE (A) 01/07/2021 1856     Radiological Exams on Admission: I have personally reviewed images CT HEAD CODE STROKE WO CONTRAST  Result Date: 01/07/2021 CLINICAL DATA:  Code stroke. Neuro deficit, acute, stroke suspected. EXAM: CT HEAD WITHOUT CONTRAST TECHNIQUE: Contiguous axial images were obtained from the base of the skull through the vertex without intravenous contrast. COMPARISON:  Noncontrast head CT 12/27/2020. FINDINGS: Brain: Redemonstrated foci of chronic encephalomalacia within the anterior frontal lobes bilaterally. Background mild generalized cerebral and cerebellar atrophy. Unchanged focus of chronic small-vessel ischemia within the right corona radiata (for instance as seen on series 4, image 21). Background patchy and ill-defined hypoattenuation within the cerebral white matter, nonspecific but compatible with chronic small vessel ischemic disease. Redemonstrated chronic lacunar infarct within the left caudate nucleus. There is no acute intracranial hemorrhage. No demarcated cortical infarct. No extra-axial fluid collection. No evidence of an intracranial mass. No midline shift. Vascular: No hyperdense vessel.  Atherosclerotic calcifications. Skull: Bifrontal cranioplasty. Sinuses/Orbits: Visualized orbits show no acute finding. Postsurgical appearance of the paranasal sinuses. Paranasal sinus disease. Most notably, there is extensive opacification of the posterior left ethmoid air cells, near complete opacification of the right maxillary sinus and moderate mucosal thickening within the left maxillary sinus. Other: Bilateral mastoid effusions. ASPECTS [facility] Stroke Program Early CT Score) - Ganglionic level infarction (caudate, lentiform nuclei, internal capsule, insula, M1-M3 cortex): 7 - Supraganglionic infarction (M4-M6 cortex): 3 Total score (0-10 with 10 being normal): 10 (when discounting chronic infarcts). These results were called by telephone at the time of interpretation on 01/07/2021 at 5:46 pm to provider  MELANIE BELFI , who verbally acknowledged these results. IMPRESSION: No evidence of acute intracranial abnormality. Redemonstrated chronic encephalomalacia within the bilateral frontal lobes, likely posttraumatic. Unchanged focus of chronic small-vessel ischemia within the right frontal lobe white matter. Background chronic small-vessel ischemic changes within the cerebral white matter. Redemonstrated chronic left basal ganglia lacunar infarct. Mild generalized parenchymal atrophy. Paranasal sinus disease, as described. Bilateral mastoid effusions. Electronically Signed   By: Kellie Simmering D.O.   On: 01/07/2021 17:47   CT ANGIO HEAD NECK W WO CM (CODE STROKE)  Result Date: 01/07/2021 CLINICAL DATA:  Stroke code. Additional history provided by ER physician: Right-sided deficits. EXAM: CT ANGIOGRAPHY HEAD AND NECK TECHNIQUE: Multidetector CT imaging of the head and neck was performed using the standard protocol during bolus administration of intravenous contrast. Multiplanar CT image reconstructions and MIPs were obtained to evaluate the vascular anatomy. Carotid stenosis measurements (when applicable) are obtained utilizing NASCET criteria, using the distal internal carotid diameter as the denominator. CONTRAST:  Administered contrast not known at this time. COMPARISON:  Noncontrast head CT performed earlier today 01/07/2021. FINDINGS: CTA NECK FINDINGS Aortic arch: Standard aortic branching. Atherosclerotic plaque within the visualized aortic arch and proximal major branch vessels of the neck. No hemodynamically significant innominate or proximal subclavian artery stenosis. Right carotid system: CCA and ICA patent within the neck. Calcified plaque within the carotid bifurcation and proximal ICA. Quantification of stenosis within the proximal right ICA is difficult due to irregularity of calcified plaque at this site. However, stenosis is estimated at up to 50%. Left carotid system: CCA and ICA patent within the  neck  without significant stenosis (50% or greater). Soft and calcified plaque within the carotid bifurcation and proximal ICA. Vertebral arteries: Vertebral arteries codominant and patent within the neck without hemodynamically significant stenosis. Skeleton: Cervical spondylosis. No acute bony abnormality or aggressive osseous lesion. Other neck: No neck mass or cervical lymphadenopathy. Upper chest: No consolidation within the imaged lung apices. Review of the MIP images confirms the above findings CTA HEAD FINDINGS Anterior circulation: The intracranial internal carotid arteries are patent. Calcified plaque within both vessels with no more than mild stenosis. The M1 middle cerebral arteries are patent. No M2 proximal branch occlusion is identified. Atherosclerotic irregularity of the M2 and more distal MCA vessels bilaterally. Most notably, there are multifocal severe stenoses within a mid M2 right MCA vessel and a severe stenosis within a distal M2 right MCA vessel. The anterior cerebral arteries are patent. No intracranial aneurysm is identified. Posterior circulation: The intracranial vertebral arteries are patent. Calcified plaque within the proximal V4 left vertebral artery with moderate stenosis. The basilar artery is patent. The posterior cerebral arteries are patent. Posterior communicating arteries are hypoplastic or absent bilaterally. Venous sinuses: Within the limitations of contrast timing, no convincing thrombus. Anatomic variants: As described Review of the MIP images confirms the above findings No intracranial large vessel occlusion identified. These results were called by telephone at the time of interpretation on 01/07/2021 at 6:21 pm to provider Dr. Curly Shores, who verbally acknowledged these results. IMPRESSION: CTA neck: 1. The common carotid and internal carotid arteries are patent within the neck. Estimation of stenosis within the proximal right ICA is difficult due to irregularity of calcified  plaque at this site, but is estimated at up to 50% stenosis. Mild soft and calcified plaque within the left carotid bifurcation and proximal left ICA with less than 50% stenosis at this site. 2. Vertebral arteries patent within the neck without hemodynamically significant stenosis. CTA head: 1. No intracranial large vessel occlusion identified. 2. Intracranial atherosclerotic disease with multifocal stenoses, most notably as follows. 3. Multifocal severe stenoses within a mid M2 right MCA vessel. 4. Severe stenosis within a distal M2 right MCA vessel. 5. Atherosclerotic plaque within the intracranial internal carotid arteries with no more than mild stenosis. 6. Moderate atherosclerotic narrowing of the proximal V4 left vertebral artery. Electronically Signed   By: Kellie Simmering D.O.   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 Was seen by telestroke neurologist. Echo in AM. MRI brain. Pt has had recent MRI. Pt has medtronic pacemaker.  Pt states this is MRI compatible. Pt's wife has his pace maker card. Lipid panel already ordered. Will give one dose of IV morphine for his severe headache.  Allow for permissive hypertension per telestroke consult.  Essential hypertension Neurology wants permissive HTN until MRI brain. Restart HTN meds if MRI brain is negative for CVA.  Pacemaker Pt has medtronic pacemaker.  Aortic stenosis Check echo. Pt follows with [facility] cardiology.  Last echo at [facility] 06-2020 showed: 1. The left ventricle is  normal in size with  mildly increased wall  thickness.    2. The left ventricular systolic function is normal, LVEF is visually  estimated at 65-70%.    3. The right ventricle is normal in size, with normal systolic function.    4. Closure of atrial septal defect with 18 mm Cribriform device. No shunt  seen by color doppler.    5. There is moderate aortic valve stenosis with a peak velocity of 3.0 m/s,  mean gradient of 22 mmHg, and aortic valve area of 1.2 cm2.    6. Doppler velocity index: 0.33.    7. There is mild to moderate aortic regurgitation.    8. There is no pulmonary hypertension, estimated pulmonary artery systolic  pressure is 39 mmHg.    Chronic alcoholism in remission ([place]) Pt states he has been alcohol-free for 3 years.  CKD (chronic kidney disease) stage 3, GFR 30-59 ml/min (HCC) Stable.  Restless legs syndrome (RLS) Continue requip  Paroxysmal atrial fibrillation (HCC) s/p Watchman device Continue lopressor. On ASA/plavix. S/p watchman. Was placed due to frequent falls.  Vascular dementia ([place]) Stable.  DVT prophylaxis: Lovenox Code Status: Full Code Family Communication: no family at bedside  Disposition Plan: return to home  Consults called: telestroke neurologist consulted by EDP  Admission status: Observation, Telemetry bed   Kristopher Oppenheim, DO [facility] 01/08/2021, 12:35 AM

## 2021-01-08 NOTE — Progress Notes (Signed)
Patient programmed DOO 95. SWOT RN to monitor patient.

## 2021-01-08 NOTE — Subjective & Objective (Signed)
CC: headache HPI: 70 year old white male with a prior history of atrial fibrillation, history of aortic stenosis, history of prior stroke, history of permanent pacemaker, status post watchman device status post ASD closure device, history of residual right-sided weakness from a prior stroke, history of restless leg syndrome, history of essential tremor who presented to [facility] ER today with complaints of headache.  Patient complained to his wife earlier in the morning about headache.  He was given some Tylenol and told to take a nap.  When he woke this afternoon around 4:00, he noted worsening right-sided weakness.  He states he had some numbness in his face.  His wife was concerned about a possible stroke and brought him to the ER.  Patient states that he uses a walker to walk.  He has had numerous falls.  He recently moved to the [place] area from [place] region.  He recently establish himself with a new primary care physician.  He was previously followed by [facility] cardiology.  Patient seen in the ER.  He was outside the tPA window.  He seen by telestroke neurologist.  They want him to get an MRI.  Patient has MRI compatible pacemaker.  Patient transferred to [facility] for further work-up.  Patient continues to complain of a headache.  He states that this is quite severe.  He states last time he had headache this severe was when he had his last stroke in January of this year.  He states that Tylenol and the magnesium he was given for his headache in the ER did not help.  He is requesting something different.  He states the Toradol did not help.  There is concern by the neurologist for possible medication seeking behavior.

## 2021-01-08 NOTE — Assessment & Plan Note (Signed)
Stable.

## 2021-01-08 NOTE — Progress Notes (Signed)
PROGRESS NOTE    Mark Thomas  MEQ:[id number] DOB: 11-May-1950 DOA: 01/07/2021 PCP: Luetta Nutting, DO    Brief Narrative:  70 year old white male with a prior history of atrial fibrillation, history of aortic stenosis, history of prior stroke, history of permanent pacemaker, status post watchman device status post ASD closure device, history of residual right-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 Pt was placed under observation for further neurologic workup  Assessment & Plan:   Principal Problem:   TIA (transient ischemic attack) Active Problems:   Chronic alcoholism in remission ([place])   CKD (chronic kidney disease) stage 3, GFR 30-59 ml/min (HCC)   Essential hypertension   Pacemaker   Restless legs syndrome (RLS)   Paroxysmal atrial fibrillation (HCC) s/p Watchman device   Aortic stenosis   Vascular dementia ([place])    Worsened R sided weakness(transient ischemic attack) Pt was seen by telestroke neurologist.  Pt underwent MRI, reviewed, with no acute process Echo reviewed. Findings of EF 65-70% with no wall motion with interatrial septum not well visualized.   Essential hypertension BP very well controlled at this time Continue home meds as tolerated   Pacemaker Pt has medtronic pacemaker.   Aortic stenosis Check echo. Pt follows with [facility] cardiology.  Last echo at [facility] 06-2020 showed: 1. The left ventricle is normal in size with mildly increased wall  thickness.    2. The left ventricular systolic function is normal, LVEF is visually  estimated at 65-70%.    3. The right ventricle is normal in size, with normal systolic function.    4. Closure of atrial septal defect with 18 mm Cribriform device. No shunt  seen by color doppler.    5. There is moderate aortic valve stenosis with a peak velocity of 3.0 m/s,  mean gradient of 22 mmHg, and aortic valve  area of 1.2 cm2.    6. Doppler velocity index: 0.33.    7. There is mild to moderate aortic regurgitation.    8. There is no pulmonary hypertension, estimated pulmonary artery systolic  pressure is 39 mmHg.     Chronic alcoholism in remission ([place]) Pt states he has been alcohol-free for 3 years.   CKD (chronic kidney disease) stage 3, GFR 30-59 ml/min (HCC) Remains stable   Restless legs syndrome (RLS) Continue requip as tolerated   Paroxysmal atrial fibrillation (HCC) s/p Watchman device Continue lopressor. On ASA/plavix. S/p watchman. Was placed due to frequent falls.   Vascular dementia ([place]) Stable.    DVT prophylaxis: Lovenox subq Code Status: Full Family Communication: Pt in room, family at bedside  Status is: Observation  The patient remains OBS appropriate and will d/c before 2 midnights.  Dispo: The patient is from: Home              Anticipated d/c is to: Home              Patient currently is not medically stable to d/c.   Difficult to place patient No       Consultants:  Neurology  Procedures:    Antimicrobials: Anti-infectives (From admission, onward)    None       Subjective: Complains of continued headaches  Objective: Vitals:   01/08/21 0409 01/08/21 0508 01/08/21 0710 01/08/21 1108  BP:  (!) 151/99 127/79 117/67  Pulse:  76 74 74  Resp:  15 13   Temp:  98.7  F (37.1 C) 98.4 F (36.9 C) 98.5 F (36.9 C)  TempSrc:  Oral Oral Oral  SpO2:  98% 96% 96%  Weight: 109 kg     Height:        Intake/Output Summary (Last 24 hours) at 01/08/2021 1730 Last data filed at 01/08/2021 0900 Gross per 24 hour  Intake 535.08 ml  Output 350 ml  Net 185.08 ml   Filed Weights   01/07/21 1721 01/08/21 0000 01/08/21 0409  Weight: 108.9 kg 109 kg 109 kg    Examination: General exam: Awake, laying in bed, in nad Respiratory system: Normal respiratory effort, no wheezing Cardiovascular system: regular rate, s1, s2 Gastrointestinal system: Soft,  nondistended, positive BS Central nervous system: CN2-12 grossly intact, strength intact Extremities: Perfused, no clubbing Skin: Normal skin turgor, no notable skin lesions seen Psychiatry: Mood normal // no visual hallucinations   Data Reviewed: I have personally reviewed following labs and imaging studies  CBC: Recent Labs  Lab 01/07/21 1750 01/08/21 0318  WBC 6.9 6.9  NEUTROABS 4.0 3.4  HGB 13.5 12.8*  HCT 40.8 38.5*  MCV 95.3 95.1  PLT 206 970   Basic Metabolic Panel: Recent Labs  Lab 01/07/21 1750 01/08/21 0318  NA 134* 133*  K 4.9 4.1  CL 99 100  CO2 25 23  GLUCOSE 117* 125*  BUN 27* 24*  CREATININE 1.25* 1.32*  CALCIUM 9.5 9.3   GFR: Estimated Creatinine Clearance: 69.3 mL/min (A) (by C-G formula based on SCr of 1.32 mg/dL (H)). Liver Function Tests: Recent Labs  Lab 01/07/21 1750 01/08/21 0318  AST 37 53*  ALT 31 40  ALKPHOS 60 65  BILITOT 0.5 0.6  PROT 7.2 6.3*  ALBUMIN 4.2 3.4*   No results for input(s): LIPASE, AMYLASE in the last 168 hours. No results for input(s): AMMONIA in the last 168 hours. Coagulation Profile: Recent Labs  Lab 01/07/21 1750  INR 1.0   Cardiac Enzymes: No results for input(s): CKTOTAL, CKMB, CKMBINDEX, TROPONINI in the last 168 hours. BNP (last 3 results) No results for input(s): PROBNP in the last 8760 hours. HbA1C: Recent Labs    01/08/21 0138  HGBA1C 6.0*   CBG: No results for input(s): GLUCAP in the last 168 hours. Lipid Profile: Recent Labs    01/07/21 1804  CHOL 131  HDL 47  LDLCALC 54  TRIG 152*  CHOLHDL 2.8   Thyroid Function Tests: No results for input(s): TSH, T4TOTAL, FREET4, T3FREE, THYROIDAB in the last 72 hours. Anemia Panel: No results for input(s): VITAMINB12, FOLATE, FERRITIN, TIBC, IRON, RETICCTPCT in the last 72 hours. Sepsis Labs: No results for input(s): PROCALCITON, LATICACIDVEN in the last 168 hours.  Recent Results (from the past 240 hour(s))  Resp Panel by RT-PCR (Flu A&B,  Covid) Nasopharyngeal Swab     Status: None   Collection Time: 01/07/21  5:18 PM   Specimen: Nasopharyngeal Swab; Nasopharyngeal(NP) swabs in vial transport medium  Result Value Ref Range Status   SARS Coronavirus 2 by RT PCR NEGATIVE NEGATIVE Final    Comment: (NOTE) SARS-CoV-2 target nucleic acids are NOT DETECTED.  The SARS-CoV-2 RNA is generally detectable in upper respiratory specimens during the acute phase of infection. The lowest concentration of SARS-CoV-2 viral copies this assay can detect is 138 copies/mL. A negative result does not preclude SARS-Cov-2 infection and should not be used as the sole basis for treatment or other patient management decisions. A negative result may occur with  improper specimen collection/handling, submission of specimen other than  nasopharyngeal swab, presence of viral mutation(s) within the areas targeted by this assay, and inadequate number of viral copies(<138 copies/mL). A negative result must be combined with clinical observations, patient history, and epidemiological information. The expected result is Negative.  Fact Sheet for Patients:  EntrepreneurPulse.com.au  Fact Sheet for Healthcare Providers:  IncredibleEmployment.be  This test is no t yet approved or cleared by the [place] FDA and  has been authorized for detection and/or diagnosis of SARS-CoV-2 by FDA under an Emergency Use Authorization (EUA). This EUA will remain  in effect (meaning this test can be used) for the duration of the COVID-19 declaration under Section 564(b)(1) of the Act, 21 U.S.C.section 360bbb-3(b)(1), unless the authorization is terminated  or revoked sooner.       Influenza A by PCR NEGATIVE NEGATIVE Final   Influenza B by PCR NEGATIVE NEGATIVE Final    Comment: (NOTE) The Xpert Xpress SARS-CoV-2/FLU/RSV plus assay is intended as an aid in the diagnosis of influenza from Nasopharyngeal swab specimens and should  not be used as a sole basis for treatment. Nasal washings and aspirates are unacceptable for Xpert Xpress SARS-CoV-2/FLU/RSV testing.  Fact Sheet for Patients: EntrepreneurPulse.com.au  Fact Sheet for Healthcare Providers: IncredibleEmployment.be  This test is not yet approved or cleared by the [place] FDA and has been authorized for detection and/or diagnosis of SARS-CoV-2 by FDA under an Emergency Use Authorization (EUA). This EUA will remain in effect (meaning this test can be used) for the duration of the COVID-19 declaration under Section 564(b)(1) of the Act, 21 U.S.C. section 360bbb-3(b)(1), unless the authorization is terminated or revoked.  Performed at [facility], [address] [place], [place] [address]      Radiology Studies: MR BRAIN W WO CONTRAST  Result Date: 01/08/2021 CLINICAL DATA:  TIA EXAM: MRI HEAD WITHOUT AND WITH CONTRAST TECHNIQUE: Multiplanar, multiecho pulse sequences of the brain and surrounding structures were obtained without and with intravenous contrast. CONTRAST:  53m GADAVIST GADOBUTROL 1 MMOL/ML IV SOLN COMPARISON:  No prior MRI, correlation is made with CT head 01/07/2021. FINDINGS: Brain: No acute infarction, hemorrhage, hydrocephalus, extra-axial collection or mass lesion. Redemonstrated encephalomalacia in the bilateral frontal lobes, subjacent to the craniotomy flap, with sequela of remote infarcts in the right corona radiata and left caudate. T2 hyperintense signal in the periventricular white matter, likely the sequela of chronic small vessel ischemic disease. No abnormal enhancement. Vascular: Normal flow voids. Skull and upper cervical spine: Prior bifrontal cranioplasty. Otherwise normal marrow signal. Sinuses/Orbits: Postoperative changes in the paranasal sinus with diffuse sinus mucosal thickening, worse in the right maxillary sinus and posterior ethmoid air cells. Other: Fluid in  the bilateral mastoid air cells. IMPRESSION: No acute intracranial process. Electronically Signed   By: AMerilyn BabaM.D.   On: 01/08/2021 16:51   CT CEREBRAL PERFUSION W CONTRAST  Result Date: 01/08/2021 CLINICAL DATA:  Initial evaluation for acute stroke. EXAM: CT PERFUSION BRAIN TECHNIQUE: Multiphase CT imaging of the brain was performed following IV bolus contrast injection. Subsequent parametric perfusion maps were calculated using RAPID software. CONTRAST:  1064mOMNIPAQUE IOHEXOL 350 MG/ML SOLN COMPARISON:  Comparison made with prior head CT and CTA from 01/07/2021. FINDINGS: Please note that there was technical difficulty of this exam, and this study did not become available for review for interpretation until 1:20 a.m. on 01/08/2021 due to technical difficulty. CT Brain Perfusion Findings: CBF (<30%) Volume: 39m36merfusion (Tmax>6.0s) volume: 227m89msmatch Volume: 27mL76mECTS on noncontrast CT Head: 10 at 5:47  p.m. on 01/07/2021. Infarct Core: 0 mL Infarction Location:Negative. IMPRESSION: 1. Negative CT perfusion. No evidence for acute ischemia or other perfusion abnormality. Apparent symmetric areas of delayed perfusion involving both cerebral hemispheres are felt to be consistent with artifact. 2. Please note that there was technical difficulty with this exam, and this study did not become available for review or interpretation until 1:20 a.m. on 01/08/21. These results were discussed with Dr. Rory Percy at 1:23 am on 01/08/2021 by Dr. Acquanetta Chain. Electronically Signed   By: Jeannine Boga M.D.   On: 01/08/2021 01:24   ECHOCARDIOGRAM COMPLETE  Result Date: 01/08/2021    ECHOCARDIOGRAM REPORT   Patient Name:   Mark Thomas Date of Exam: 01/08/2021 Medical Rec #:  [id number]       Height:       73.0 in Accession #:    [id number]      Weight:       240.3 lb Date of Birth:  [birth year]       BSA:          2.326 m Patient Age:    30 years        BP:           151/99 mmHg Patient Gender: M                HR:           77 bpm. Exam Location:  Inpatient Procedure: 2D Echo, Cardiac Doppler, Color Doppler and Intracardiac            Opacification Agent Indications:    CVA  History:        Patient has no prior history of Echocardiogram examinations. CAD                 and Previous Myocardial Infarction, Stroke and TIA,                 Arrythmias:Bradycardia and Atrial Fibrillation; Risk                 Factors:Hypertension and Dyslipidemia. 07/09/2016 cath                 07/27/2016 EP and ablation                 02/17/2017 pacemaker implant                 09/08/2017 cath                 03/19/2019 Left atrial appendage closure                 11/13/2019 ablation                 01/20/2020 ASD closure.  Sonographer:    Luisa Hart RDCS Referring Phys: [id number] ERIC CHEN  Sonographer Comments: Suboptimal subcostal window, suboptimal apical window, suboptimal parasternal window, Technically challenging study due to limited acoustic windows and patient is morbidly obese. Image acquisition challenging due to patient body habitus. IMPRESSIONS  1. Technically difficult study with very limited views. Left ventricular ejection fraction, by estimation, is 65 to 70%. The left ventricle has normal function. The left ventricle has no regional wall motion abnormalities. There is moderate left ventricular hypertrophy. Left ventricular diastolic parameters are indeterminate.  2. Right ventricular systolic function was not well visualized. The right ventricular size is not well visualized.  3. The mitral valve was not well visualized.  4. Tricuspid valve regurgitation not well  visualized.  5. Pulmonic valve regurgitation not well visualized.  6. The aortic valve was not well visualized. Aortic valve regurgitation is not visualized. Limited evaluation of aortic stenosis, appears to be mild AS (MG 29mHg, AVA 1.9 cm^2, DI 0.44) FINDINGS  Left Ventricle: Left ventricular ejection fraction, by estimation, is 65 to 70%. The left ventricle has  normal function. The left ventricle has no regional wall motion abnormalities. The left ventricular internal cavity size was normal in size. There is  moderate left ventricular hypertrophy. Left ventricular diastolic parameters are indeterminate. Right Ventricle: The right ventricular size is not well visualized. Right vetricular wall thickness was not well visualized. Right ventricular systolic function was not well visualized. Left Atrium: Left atrial size was not well visualized. Right Atrium: Right atrial size was not well visualized. Pericardium: Trivial pericardial effusion is present. Mitral Valve: The mitral valve was not well visualized. Not well visualized mitral valve regurgitation. Tricuspid Valve: The tricuspid valve is not well visualized. Tricuspid valve regurgitation not well visualized. Aortic Valve: The aortic valve was not well visualized. Aortic valve regurgitation is not visualized. Mild aortic stenosis is present. Aortic valve mean gradient measures 7.0 mmHg. Aortic valve peak gradient measures 12.0 mmHg. Aortic valve area, by VTI measures 1.94 cm. Pulmonic Valve: The pulmonic valve was not well visualized. Pulmonic valve regurgitation not well visualized. Aorta: The ascending aorta was not well visualized. IAS/Shunts: The interatrial septum was not well visualized.  LEFT VENTRICLE PLAX 2D LVIDd:         4.70 cm  Diastology LVIDs:         4.10 cm  LV e' medial:    4.81 cm/s LV PW:         1.80 cm  LV E/e' medial:  13.6 LV IVS:        1.40 cm  LV e' lateral:   4.43 cm/s LVOT diam:     2.40 cm  LV E/e' lateral: 14.8 LV SV:         70 LV SV Index:   30 LVOT Area:     4.52 cm  LEFT ATRIUM         Index LA diam:    4.80 cm 2.06 cm/m  AORTIC VALVE AV Area (Vmax):    2.12 cm AV Area (Vmean):   1.88 cm AV Area (VTI):     1.94 cm AV Vmax:           173.00 cm/s AV Vmean:          128.000 cm/s AV VTI:            0.362 m AV Peak Grad:      12.0 mmHg AV Mean Grad:      7.0 mmHg LVOT Vmax:          81.00 cm/s LVOT Vmean:        53.100 cm/s LVOT VTI:          0.155 m LVOT/AV VTI ratio: 0.43 MITRAL VALVE MV Area (PHT): 5.54 cm    SHUNTS MV Decel Time: 137 msec    Systemic VTI:  0.16 m MV E velocity: 65.60 cm/s  Systemic Diam: 2.40 cm MV A velocity: 29.60 cm/s MV E/A ratio:  2.22 COswaldo MilianMD Electronically signed by COswaldo MilianMD Signature Date/Time: 01/08/2021/1:27:01 PM    Final    CT HEAD CODE STROKE WO CONTRAST  Result Date: 01/07/2021 CLINICAL DATA:  Code stroke. Neuro deficit, acute, stroke suspected. EXAM: CT HEAD WITHOUT CONTRAST  TECHNIQUE: Contiguous axial images were obtained from the base of the skull through the vertex without intravenous contrast. COMPARISON:  Noncontrast head CT 12/27/2020. FINDINGS: Brain: Redemonstrated foci of chronic encephalomalacia within the anterior frontal lobes bilaterally. Background mild generalized cerebral and cerebellar atrophy. Unchanged focus of chronic small-vessel ischemia within the right corona radiata (for instance as seen on series 4, image 21). Background patchy and ill-defined hypoattenuation within the cerebral white matter, nonspecific but compatible with chronic small vessel ischemic disease. Redemonstrated chronic lacunar infarct within the left caudate nucleus. There is no acute intracranial hemorrhage. No demarcated cortical infarct. No extra-axial fluid collection. No evidence of an intracranial mass. No midline shift. Vascular: No hyperdense vessel.  Atherosclerotic calcifications. Skull: Bifrontal cranioplasty. Sinuses/Orbits: Visualized orbits show no acute finding. Postsurgical appearance of the paranasal sinuses. Paranasal sinus disease. Most notably, there is extensive opacification of the posterior left ethmoid air cells, near complete opacification of the right maxillary sinus and moderate mucosal thickening within the left maxillary sinus. Other: Bilateral mastoid effusions. ASPECTS [facility] Stroke Program Early CT  Score) - Ganglionic level infarction (caudate, lentiform nuclei, internal capsule, insula, M1-M3 cortex): 7 - Supraganglionic infarction (M4-M6 cortex): 3 Total score (0-10 with 10 being normal): 10 (when discounting chronic infarcts). These results were called by telephone at the time of interpretation on 01/07/2021 at 5:46 pm to provider MELANIE BELFI , who verbally acknowledged these results. IMPRESSION: No evidence of acute intracranial abnormality. Redemonstrated chronic encephalomalacia within the bilateral frontal lobes, likely posttraumatic. Unchanged focus of chronic small-vessel ischemia within the right frontal lobe white matter. Background chronic small-vessel ischemic changes within the cerebral white matter. Redemonstrated chronic left basal ganglia lacunar infarct. Mild generalized parenchymal atrophy. Paranasal sinus disease, as described. Bilateral mastoid effusions. Electronically Signed   By: Kellie Simmering D.O.   On: 01/07/2021 17:47   CT ANGIO HEAD NECK W WO CM (CODE STROKE)  Result Date: 01/07/2021 CLINICAL DATA:  Stroke code. Additional history provided by ER physician: Right-sided deficits. EXAM: CT ANGIOGRAPHY HEAD AND NECK TECHNIQUE: Multidetector CT imaging of the head and neck was performed using the standard protocol during bolus administration of intravenous contrast. Multiplanar CT image reconstructions and MIPs were obtained to evaluate the vascular anatomy. Carotid stenosis measurements (when applicable) are obtained utilizing NASCET criteria, using the distal internal carotid diameter as the denominator. CONTRAST:  Administered contrast not known at this time. COMPARISON:  Noncontrast head CT performed earlier today 01/07/2021. FINDINGS: CTA NECK FINDINGS Aortic arch: Standard aortic branching. Atherosclerotic plaque within the visualized aortic arch and proximal major branch vessels of the neck. No hemodynamically significant innominate or proximal subclavian artery stenosis. Right  carotid system: CCA and ICA patent within the neck. Calcified plaque within the carotid bifurcation and proximal ICA. Quantification of stenosis within the proximal right ICA is difficult due to irregularity of calcified plaque at this site. However, stenosis is estimated at up to 50%. Left carotid system: CCA and ICA patent within the neck without significant stenosis (50% or greater). Soft and calcified plaque within the carotid bifurcation and proximal ICA. Vertebral arteries: Vertebral arteries codominant and patent within the neck without hemodynamically significant stenosis. Skeleton: Cervical spondylosis. No acute bony abnormality or aggressive osseous lesion. Other neck: No neck mass or cervical lymphadenopathy. Upper chest: No consolidation within the imaged lung apices. Review of the MIP images confirms the above findings CTA HEAD FINDINGS Anterior circulation: The intracranial internal carotid arteries are patent. Calcified plaque within both vessels with no more than mild stenosis. The  M1 middle cerebral arteries are patent. No M2 proximal branch occlusion is identified. Atherosclerotic irregularity of the M2 and more distal MCA vessels bilaterally. Most notably, there are multifocal severe stenoses within a mid M2 right MCA vessel and a severe stenosis within a distal M2 right MCA vessel. The anterior cerebral arteries are patent. No intracranial aneurysm is identified. Posterior circulation: The intracranial vertebral arteries are patent. Calcified plaque within the proximal V4 left vertebral artery with moderate stenosis. The basilar artery is patent. The posterior cerebral arteries are patent. Posterior communicating arteries are hypoplastic or absent bilaterally. Venous sinuses: Within the limitations of contrast timing, no convincing thrombus. Anatomic variants: As described Review of the MIP images confirms the above findings No intracranial large vessel occlusion identified. These results were  called by telephone at the time of interpretation on 01/07/2021 at 6:21 pm to provider Dr. Curly [place], who verbally acknowledged these results. IMPRESSION: CTA neck: 1. The common carotid and internal carotid arteries are patent within the neck. Estimation of stenosis within the proximal right ICA is difficult due to irregularity of calcified plaque at this site, but is estimated at up to 50% stenosis. Mild soft and calcified plaque within the left carotid bifurcation and proximal left ICA with less than 50% stenosis at this site. 2. Vertebral arteries patent within the neck without hemodynamically significant stenosis. CTA head: 1. No intracranial large vessel occlusion identified. 2. Intracranial atherosclerotic disease with multifocal stenoses, most notably as follows. 3. Multifocal severe stenoses within a mid M2 right MCA vessel. 4. Severe stenosis within a distal M2 right MCA vessel. 5. Atherosclerotic plaque within the intracranial internal carotid arteries with no more than mild stenosis. 6. Moderate atherosclerotic narrowing of the proximal V4 left vertebral artery. Electronically Signed   By: Kellie Simmering D.O.   On: 01/07/2021 18:30    Scheduled Meds:  allopurinol  100 mg Oral Daily   aspirin EC  81 mg Oral Daily   atorvastatin  80 mg Oral Daily   clopidogrel  75 mg Oral Daily   enoxaparin (LOVENOX) injection  40 mg Subcutaneous Q24H   escitalopram  20 mg Oral Daily   latanoprost  1 drop Both Eyes QHS   losartan  25 mg Oral Daily   magnesium oxide  400 mg Oral Daily   metoprolol tartrate  12.5 mg Oral BID   pantoprazole  40 mg Oral BID   primidone  100 mg Oral BID   QUEtiapine  100 mg Oral QHS   ranolazine  1,000 mg Oral BID   rOPINIRole  3 mg Oral QHS   Continuous Infusions:  sodium chloride       LOS: 0 days   Marylu Lund, MD [facility] Pager On Amion  If 7PM-7AM, please contact night-coverage 01/08/2021, 5:30 PM

## 2021-01-08 NOTE — Assessment & Plan Note (Signed)
Pt has medtronic pacemaker.

## 2021-01-08 NOTE — Assessment & Plan Note (Signed)
Continue requip

## 2021-01-09 DIAGNOSIS — I2510 Atherosclerotic heart disease of native coronary artery without angina pectoris: Secondary | ICD-10-CM | POA: Diagnosis present

## 2021-01-09 DIAGNOSIS — G250 Essential tremor: Secondary | ICD-10-CM | POA: Diagnosis present

## 2021-01-09 DIAGNOSIS — H538 Other visual disturbances: Secondary | ICD-10-CM | POA: Diagnosis present

## 2021-01-09 DIAGNOSIS — R296 Repeated falls: Secondary | ICD-10-CM | POA: Diagnosis present

## 2021-01-09 DIAGNOSIS — Z8249 Family history of ischemic heart disease and other diseases of the circulatory system: Secondary | ICD-10-CM | POA: Diagnosis not present

## 2021-01-09 DIAGNOSIS — Z8774 Personal history of (corrected) congenital malformations of heart and circulatory system: Secondary | ICD-10-CM | POA: Diagnosis not present

## 2021-01-09 DIAGNOSIS — F1021 Alcohol dependence, in remission: Secondary | ICD-10-CM | POA: Diagnosis present

## 2021-01-09 DIAGNOSIS — I252 Old myocardial infarction: Secondary | ICD-10-CM | POA: Diagnosis not present

## 2021-01-09 DIAGNOSIS — I10 Essential (primary) hypertension: Secondary | ICD-10-CM | POA: Diagnosis not present

## 2021-01-09 DIAGNOSIS — G2581 Restless legs syndrome: Secondary | ICD-10-CM | POA: Diagnosis present

## 2021-01-09 DIAGNOSIS — I69351 Hemiplegia and hemiparesis following cerebral infarction affecting right dominant side: Secondary | ICD-10-CM | POA: Diagnosis not present

## 2021-01-09 DIAGNOSIS — E7800 Pure hypercholesterolemia, unspecified: Secondary | ICD-10-CM | POA: Diagnosis present

## 2021-01-09 DIAGNOSIS — I129 Hypertensive chronic kidney disease with stage 1 through stage 4 chronic kidney disease, or unspecified chronic kidney disease: Secondary | ICD-10-CM | POA: Diagnosis present

## 2021-01-09 DIAGNOSIS — Z20822 Contact with and (suspected) exposure to covid-19: Secondary | ICD-10-CM | POA: Diagnosis present

## 2021-01-09 DIAGNOSIS — F32A Depression, unspecified: Secondary | ICD-10-CM | POA: Diagnosis present

## 2021-01-09 DIAGNOSIS — Z955 Presence of coronary angioplasty implant and graft: Secondary | ICD-10-CM | POA: Diagnosis not present

## 2021-01-09 DIAGNOSIS — N183 Chronic kidney disease, stage 3 unspecified: Secondary | ICD-10-CM | POA: Diagnosis present

## 2021-01-09 DIAGNOSIS — I350 Nonrheumatic aortic (valve) stenosis: Secondary | ICD-10-CM | POA: Diagnosis present

## 2021-01-09 DIAGNOSIS — Z950 Presence of cardiac pacemaker: Secondary | ICD-10-CM | POA: Diagnosis not present

## 2021-01-09 DIAGNOSIS — F419 Anxiety disorder, unspecified: Secondary | ICD-10-CM | POA: Diagnosis present

## 2021-01-09 DIAGNOSIS — G459 Transient cerebral ischemic attack, unspecified: Secondary | ICD-10-CM | POA: Diagnosis present

## 2021-01-09 DIAGNOSIS — I480 Paroxysmal atrial fibrillation: Secondary | ICD-10-CM | POA: Diagnosis present

## 2021-01-09 DIAGNOSIS — F0150 Vascular dementia without behavioral disturbance: Secondary | ICD-10-CM | POA: Diagnosis present

## 2021-01-09 DIAGNOSIS — G9389 Other specified disorders of brain: Secondary | ICD-10-CM | POA: Diagnosis present

## 2021-01-09 DIAGNOSIS — Z823 Family history of stroke: Secondary | ICD-10-CM | POA: Diagnosis not present

## 2021-01-09 DIAGNOSIS — R531 Weakness: Secondary | ICD-10-CM

## 2021-01-09 NOTE — Progress Notes (Signed)
STROKE TEAM PROGRESS NOTE    INTERVAL HISTORY No one is at the bedside at time of this exam. 70 year old male with multiple medical comorbidities presenting with severe headache, dizziness, and right cheek numbness. His wife is at the bedside. She reports persistent left occipital headaches but she is concerned about using too many narcotic medications given abuse history.   Vitals:   01/09/21 0024 01/09/21 0422 01/09/21 0456 01/09/21 0700  BP:  128/73  (!) 122/54  Pulse:  74    Resp: 16 14    Temp: 98.4 F (36.9 C) 98.7 F (37.1 C)    TempSrc: Oral Oral    SpO2:  93%    Weight:   108.9 kg   Height:       CBC:  Recent Labs  Lab 01/07/21 1750 01/08/21 0318  WBC 6.9 6.9  NEUTROABS 4.0 3.4  HGB 13.5 12.8*  HCT 40.8 38.5*  MCV 95.3 95.1  PLT 206 779    Basic Metabolic Panel:  Recent Labs  Lab 01/07/21 1750 01/08/21 0318  NA 134* 133*  K 4.9 4.1  CL 99 100  CO2 25 23  GLUCOSE 117* 125*  BUN 27* 24*  CREATININE 1.25* 1.32*  CALCIUM 9.5 9.3      Lipid Panel:  Recent Labs  Lab 01/07/21 1804  CHOL 131  TRIG 152*  HDL 47  CHOLHDL 2.8  VLDL 30  LDLCALC 54    HgbA1c:  Recent Labs  Lab 01/08/21 0138  HGBA1C 6.0*    Urine Drug Screen:  Recent Labs  Lab 01/07/21 1856  LABOPIA NONE DETECTED  COCAINSCRNUR NONE DETECTED  LABBENZ NONE DETECTED  AMPHETMU NONE DETECTED  THCU NONE DETECTED  LABBARB POSITIVE*     Alcohol Level  Recent Labs  Lab 01/07/21 1750  ETH 11*     IMAGING past 24 hours MR BRAIN W WO CONTRAST  Result Date: 01/08/2021 CLINICAL DATA:  TIA EXAM: MRI HEAD WITHOUT AND WITH CONTRAST TECHNIQUE: Multiplanar, multiecho pulse sequences of the brain and surrounding structures were obtained without and with intravenous contrast. CONTRAST:  61m GADAVIST GADOBUTROL 1 MMOL/ML IV SOLN COMPARISON:  No prior MRI, correlation is made with CT head 01/07/2021. FINDINGS: Brain: No acute infarction, hemorrhage, hydrocephalus, extra-axial collection or  mass lesion. Redemonstrated encephalomalacia in the bilateral frontal lobes, subjacent to the craniotomy flap, with sequela of remote infarcts in the right corona radiata and left caudate. T2 hyperintense signal in the periventricular white matter, likely the sequela of chronic small vessel ischemic disease. No abnormal enhancement. Vascular: Normal flow voids. Skull and upper cervical spine: Prior bifrontal cranioplasty. Otherwise normal marrow signal. Sinuses/Orbits: Postoperative changes in the paranasal sinus with diffuse sinus mucosal thickening, worse in the right maxillary sinus and posterior ethmoid air cells. Other: Fluid in the bilateral mastoid air cells. IMPRESSION: No acute intracranial process. Electronically Signed   By: AMerilyn BabaM.D.   On: 01/08/2021 16:51   ECHOCARDIOGRAM COMPLETE  Result Date: 01/08/2021    ECHOCARDIOGRAM REPORT   Patient Name:   TCAMDEN KNOTEKDate of Exam: 01/08/2021 Medical Rec #:  [id number]      Height:       73.0 in Accession #:    [id number]     Weight:       240.3 lb Date of Birth:  808-24-1952      BSA:          2.326 m Patient Age:    89years  BP:           151/99 mmHg Patient Gender: M               HR:           77 bpm. Exam Location:  Inpatient Procedure: 2D Echo, Cardiac Doppler, Color Doppler and Intracardiac            Opacification Agent Indications:    CVA  History:        Patient has no prior history of Echocardiogram examinations. CAD                 and Previous Myocardial Infarction, Stroke and TIA,                 Arrythmias:Bradycardia and Atrial Fibrillation; Risk                 Factors:Hypertension and Dyslipidemia. 07/09/2016 cath                 07/27/2016 EP and ablation                 02/17/2017 pacemaker implant                 09/08/2017 cath                 03/19/2019 Left atrial appendage closure                 11/13/2019 ablation                 01/20/2020 ASD closure.  Sonographer:    Luisa Hart RDCS Referring Phys: [id number] ERIC CHEN   Sonographer Comments: Suboptimal subcostal window, suboptimal apical window, suboptimal parasternal window, Technically challenging study due to limited acoustic windows and patient is morbidly obese. Image acquisition challenging due to patient body habitus. IMPRESSIONS  1. Technically difficult study with very limited views. Left ventricular ejection fraction, by estimation, is 65 to 70%. The left ventricle has normal function. The left ventricle has no regional wall motion abnormalities. There is moderate left ventricular hypertrophy. Left ventricular diastolic parameters are indeterminate.  2. Right ventricular systolic function was not well visualized. The right ventricular size is not well visualized.  3. The mitral valve was not well visualized.  4. Tricuspid valve regurgitation not well visualized.  5. Pulmonic valve regurgitation not well visualized.  6. The aortic valve was not well visualized. Aortic valve regurgitation is not visualized. Limited evaluation of aortic stenosis, appears to be mild AS (MG 48mHg, AVA 1.9 cm^2, DI 0.44) FINDINGS  Left Ventricle: Left ventricular ejection fraction, by estimation, is 65 to 70%. The left ventricle has normal function. The left ventricle has no regional wall motion abnormalities. The left ventricular internal cavity size was normal in size. There is  moderate left ventricular hypertrophy. Left ventricular diastolic parameters are indeterminate. Right Ventricle: The right ventricular size is not well visualized. Right vetricular wall thickness was not well visualized. Right ventricular systolic function was not well visualized. Left Atrium: Left atrial size was not well visualized. Right Atrium: Right atrial size was not well visualized. Pericardium: Trivial pericardial effusion is present. Mitral Valve: The mitral valve was not well visualized. Not well visualized mitral valve regurgitation. Tricuspid Valve: The tricuspid valve is not well visualized. Tricuspid  valve regurgitation not well visualized. Aortic Valve: The aortic valve was not well visualized. Aortic valve regurgitation is not visualized. Mild aortic stenosis is present. Aortic valve mean gradient measures 7.0  mmHg. Aortic valve peak gradient measures 12.0 mmHg. Aortic valve area, by VTI measures 1.94 cm. Pulmonic Valve: The pulmonic valve was not well visualized. Pulmonic valve regurgitation not well visualized. Aorta: The ascending aorta was not well visualized. IAS/Shunts: The interatrial septum was not well visualized.  LEFT VENTRICLE PLAX 2D LVIDd:         4.70 cm  Diastology LVIDs:         4.10 cm  LV e' medial:    4.81 cm/s LV PW:         1.80 cm  LV E/e' medial:  13.6 LV IVS:        1.40 cm  LV e' lateral:   4.43 cm/s LVOT diam:     2.40 cm  LV E/e' lateral: 14.8 LV SV:         70 LV SV Index:   30 LVOT Area:     4.52 cm  LEFT ATRIUM         Index LA diam:    4.80 cm 2.06 cm/m  AORTIC VALVE AV Area (Vmax):    2.12 cm AV Area (Vmean):   1.88 cm AV Area (VTI):     1.94 cm AV Vmax:           173.00 cm/s AV Vmean:          128.000 cm/s AV VTI:            0.362 m AV Peak Grad:      12.0 mmHg AV Mean Grad:      7.0 mmHg LVOT Vmax:         81.00 cm/s LVOT Vmean:        53.100 cm/s LVOT VTI:          0.155 m LVOT/AV VTI ratio: 0.43 MITRAL VALVE MV Area (PHT): 5.54 cm    SHUNTS MV Decel Time: 137 msec    Systemic VTI:  0.16 m MV E velocity: 65.60 cm/s  Systemic Diam: 2.40 cm MV A velocity: 29.60 cm/s MV E/A ratio:  2.22 Oswaldo Milian MD Electronically signed by Oswaldo Milian MD Signature Date/Time: 01/08/2021/1:27:01 PM    Final     PHYSICAL EXAM  .  ASSESSMENT/PLAN Mr. JONTY MORRICAL is a 70 y.o. 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, vascular dementia, resolved atrial fibrillation no longer on anticoagulation s/p ablation x2 (07/28/2016, 11/20/2019) as well as watchman device placement  (03/19/2019), atrial septal defect closure, sick sinus syndrome s/p MRI compatible pacemaker placement (01/2017), coronary artery disease s/p LAD stent, labile hypertension, concern for aortic stenosis, history of alcohol abuse (sober at this time), history of depression , who presented to this hospital with numerous complaints, including severe left occipital headache, right cheek numbness, right leg weakness, dysarthric speech with lip smacking, constant severe blinking  Possible occipital neuralgia vs. Complicated headache, less likely TIA    Etiology for patient symptoms likely bilateral occipital neuralgia versus complicated headache, less likely TIA or seizure.  Discussed with patient and wife, recommend bilateral occipital nerve block, however patient refused at this time.    Code Stroke  CT head No acute abnormality.  Small vessel disease. Atrophy.  ASPECTS 10.     CTA neck :  1. The common carotid and internal carotid arteries are patent within the neck. Estimation of stenosis within the proximal right ICA is difficult due to irregularity of calcified plaque at this site, but is  estimated at up to 50% stenosis. Mild soft and calcified plaque within the left carotid bifurcation and proximal left ICA with less than 50% stenosis at this site. 2. Vertebral arteries patent within the neck without hemodynamically significant stenosis.  CTA head: 1. No intracranial large vessel occlusion identified.  2. Intracranial atherosclerotic disease with multifocal stenoses, most notably as follows. 3. Multifocal severe stenoses within a mid M2 right MCA vessel.  4. Severe stenosis within a distal M2 right MCA vessel. 5. Atherosclerotic plaque within the intracranial internal carotid arteries with no more than mild stenosis. 6. Moderate atherosclerotic narrowing of the proximal V4 left vertebral artery.   CT perfusion  1. Negative CT perfusion. No evidence for acute ischemia or other perfusion abnormality.  Apparent symmetric areas of delayed perfusion involving both cerebral hemispheres are felt to be consistent with artifact.   MRI BRAIN WITH AND WITHOUT CONTRAST - unremarkable  2D Echo LVEF 65=70%, LVH, interatrial septum was not well  visualized.  LDL 54 HgbA1c 6.0 VTE prophylaxis - scd    Diet   Diet regular Room service appropriate? Yes; Fluid consistency: Thin   aspirin 81 mg daily and clopidogrel 75 mg daily prior to admission, now on home aspirin 81 mg daily and clopidogrel 75 mg daily.   Therapy recommendations:  pending Disposition:  pending  He has appointment with [facility] Dr. April Manson on 02/08/21   Hypertension Home meds:  losartan, metoprolol Stable Permissive hypertension (OK if < 220/120) but gradually normalize in 5-7 days Long-term BP goal normotensive  Hyperlipidemia Home meds:  atorvastatin 81m, resumed in hospital LDL 54 goal < 70 High intensity statin   Continue statin at discharge  HgbA1c 6.0, goal < 7.0 CBGs No results for input(s): GLUCAP in the last 72 hours.  SSI  Other Stroke Risk Factors  Advanced Age >/= 616  ETOH use, alcohol level 11, advised to drink no more than 1 2 drink(s) a day  Obesity, Body mass index is 31.67 kg/m., BMI >/= 30 associated with increased stroke risk, recommend weight loss, diet and exercise as appropriate   Hx stroke/TIA  Other Active Problems Chronic alcoholism in remission ([facility] Pt states he has been alcohol-free for 3 years.  CKD (chronic kidney disease) stage 3, GFR 30-59 ml/min (HCC) Stable. Restless legs syndrome (RLS) Continue requip Paroxysmal atrial fibrillation (HCC) s/p Watchman device Continue lopressor. On ASA/plavix. S/p watchman. Was placed due to frequent falls. Vascular dementia ([facility] Stable.   Hospital day # 0     To contact Stroke Continuity provider, please refer to [email or web address] After hours, contact General Neurology

## 2021-01-09 NOTE — Plan of Care (Signed)
  Problem: Education: Goal: Knowledge of General Education information will improve Description: Including pain rating scale, medication(s)/side effects and non-pharmacologic comfort measures Outcome: Progressing   Problem: Health Behavior/Discharge Planning: Goal: Ability to manage health-related needs will improve Outcome: Progressing   Problem: Clinical Measurements: Goal: Ability to maintain clinical measurements within normal limits will improve Outcome: Progressing Goal: Will remain free from infection Outcome: Progressing Goal: Diagnostic test results will improve Outcome: Progressing Goal: Respiratory complications will improve Outcome: Progressing Goal: Cardiovascular complication will be avoided Outcome: Progressing   Problem: Activity: Goal: Risk for activity intolerance will decrease Outcome: Progressing   Problem: Nutrition: Goal: Adequate nutrition will be maintained Outcome: Progressing   Problem: Coping: Goal: Level of anxiety will decrease Outcome: Progressing   Problem: Elimination: Goal: Will not experience complications related to bowel motility Outcome: Progressing Goal: Will not experience complications related to urinary retention Outcome: Progressing   Problem: Pain Managment: Goal: General experience of comfort will improve Outcome: Progressing   Problem: Safety: Goal: Ability to remain free from injury will improve Outcome: Progressing   Problem: Skin Integrity: Goal: Risk for impaired skin integrity will decrease Outcome: Progressing   Problem: Education: Goal: Knowledge of disease or condition will improve Outcome: Progressing Goal: Knowledge of secondary prevention will improve Outcome: Progressing Goal: Knowledge of patient specific risk factors addressed and post discharge goals established will improve Outcome: Progressing Goal: Individualized Educational Video(s) Outcome: Progressing   Problem: Coping: Goal: Will verbalize  positive feelings about self Outcome: Progressing Goal: Will identify appropriate support needs Outcome: Progressing   Problem: Health Behavior/Discharge Planning: Goal: Ability to manage health-related needs will improve Outcome: Progressing   Problem: Self-Care: Goal: Ability to participate in self-care as condition permits will improve Outcome: Progressing Goal: Verbalization of feelings and concerns over difficulty with self-care will improve Outcome: Progressing Goal: Ability to communicate needs accurately will improve Outcome: Progressing   Problem: Nutrition: Goal: Risk of aspiration will decrease Outcome: Progressing Goal: Dietary intake will improve Outcome: Progressing   Problem: Intracerebral Hemorrhage Tissue Perfusion: Goal: Complications of Intracerebral Hemorrhage will be minimized Outcome: Progressing   Problem: Ischemic Stroke/TIA Tissue Perfusion: Goal: Complications of ischemic stroke/TIA will be minimized Outcome: Progressing

## 2021-01-09 NOTE — Care Management Obs Status (Signed)
[place] NOTIFICATION   Patient Details  Name: Mark Thomas MRN: [id number] Date of Birth: 1950-06-19   Medicare Observation Status Notification Given:  Yes    Carles Collet, RN 01/09/2021, 8:24 AM

## 2021-01-09 NOTE — Plan of Care (Signed)
  Problem: Education: Goal: Knowledge of General Education information will improve Description: Including pain rating scale, medication(s)/side effects and non-pharmacologic comfort measures Outcome: Adequate for Discharge   Problem: Health Behavior/Discharge Planning: Goal: Ability to manage health-related needs will improve Outcome: Adequate for Discharge   Problem: Clinical Measurements: Goal: Ability to maintain clinical measurements within normal limits will improve Outcome: Adequate for Discharge Goal: Will remain free from infection Outcome: Adequate for Discharge Goal: Diagnostic test results will improve Outcome: Adequate for Discharge Goal: Respiratory complications will improve Outcome: Adequate for Discharge Goal: Cardiovascular complication will be avoided Outcome: Adequate for Discharge   Problem: Activity: Goal: Risk for activity intolerance will decrease Outcome: Adequate for Discharge   Problem: Nutrition: Goal: Adequate nutrition will be maintained Outcome: Adequate for Discharge   Problem: Coping: Goal: Level of anxiety will decrease Outcome: Adequate for Discharge   Problem: Elimination: Goal: Will not experience complications related to bowel motility Outcome: Adequate for Discharge Goal: Will not experience complications related to urinary retention Outcome: Adequate for Discharge   Problem: Pain Managment: Goal: General experience of comfort will improve Outcome: Adequate for Discharge   Problem: Safety: Goal: Ability to remain free from injury will improve Outcome: Adequate for Discharge   Problem: Skin Integrity: Goal: Risk for impaired skin integrity will decrease Outcome: Adequate for Discharge   Problem: Education: Goal: Knowledge of disease or condition will improve Outcome: Adequate for Discharge Goal: Knowledge of secondary prevention will improve Outcome: Adequate for Discharge Goal: Knowledge of patient specific risk factors  addressed and post discharge goals established will improve Outcome: Adequate for Discharge Goal: Individualized Educational Video(s) Outcome: Adequate for Discharge   Problem: Coping: Goal: Will verbalize positive feelings about self Outcome: Adequate for Discharge Goal: Will identify appropriate support needs Outcome: Adequate for Discharge   Problem: Health Behavior/Discharge Planning: Goal: Ability to manage health-related needs will improve Outcome: Adequate for Discharge   Problem: Self-Care: Goal: Ability to participate in self-care as condition permits will improve Outcome: Adequate for Discharge Goal: Verbalization of feelings and concerns over difficulty with self-care will improve Outcome: Adequate for Discharge Goal: Ability to communicate needs accurately will improve Outcome: Adequate for Discharge   Problem: Nutrition: Goal: Risk of aspiration will decrease Outcome: Adequate for Discharge Goal: Dietary intake will improve Outcome: Adequate for Discharge   Problem: Intracerebral Hemorrhage Tissue Perfusion: Goal: Complications of Intracerebral Hemorrhage will be minimized Outcome: Adequate for Discharge   Problem: Ischemic Stroke/TIA Tissue Perfusion: Goal: Complications of ischemic stroke/TIA will be minimized Outcome: Adequate for Discharge

## 2021-01-09 NOTE — Care Management (Signed)
Per handoff, Jacqualin Combes RN CM: "9/9: home with spouse--pt to start outpt next week (already arranged)"

## 2021-01-09 NOTE — Evaluation (Signed)
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  Patient complained to his wife earlier in the morning about headache.  He was given some Tylenol and told to take a nap.  When he woke this afternoon around 4:00, he noted worsening right-sided weakness.  He states he had some numbness in his face.  His wife was concerned about a possible stroke and brought him to the ER.   Clinical Impression  Pt admitted with above diagnosis. PTA pt lived at home with his wife, mod I mobility with rollator. On eval, pt required min guard assist transfers and ambulation 180' with RW. Pt currently with functional limitations due to the deficits listed below (see PT Problem List). Pt will benefit from skilled PT to increase their independence and safety with mobility to allow discharge to the venue listed below.       Follow Up Recommendations Outpatient PT;Supervision for mobility/OOB    Equipment Recommendations  None recommended by PT    Recommendations for Other Services       Precautions / Restrictions Precautions Precautions: Fall Precaution Comments: Pt reports multi falls at home.      Mobility  Bed Mobility Overal bed mobility: Modified Independent             General bed mobility comments: +rail, increased time    Transfers Overall transfer level: Needs assistance Equipment used: Rolling walker (2 wheeled) Transfers: Sit to/from Stand   Stand pivot transfers: Min guard        General transfer comment: cues for hand placement, increased time, assist for safety  Ambulation/Gait Ambulation/Gait assistance: Min guard Gait Distance (Feet): 180 Feet Assistive device: Rolling walker (2 wheeled) Gait Pattern/deviations: Step-through pattern;Decreased stride length Gait velocity: decreased Gait velocity interpretation: <1.31 ft/sec, indicative of household ambulator General Gait Details: slow, steady gait with RW; min guard assist for safety; no LOB  Stairs            Wheelchair Mobility    Modified Rankin (Stroke Patients Only) Modified Rankin (Stroke Patients Only) Pre-Morbid Rankin Score: Moderate disability Modified Rankin: Moderately severe disability     Balance Overall balance assessment: History of Falls;Needs assistance Sitting-balance support: No upper extremity supported;Feet supported Sitting balance-Leahy Scale: Good     Standing balance support: Bilateral upper extremity supported;During functional activity;No upper extremity supported Standing balance-Leahy Scale: Fair Standing balance comment: static stand without UE support, RW for amb                             Pertinent Vitals/Pain Pain Assessment: Faces Faces Pain Scale: Hurts little more Pain Location: headache Pain Descriptors / Indicators: Discomfort;Dull Pain Intervention(s): Monitored during session    Home Living Family/patient expects to be discharged to:: Private residence Living Arrangements: Spouse/significant other Available Help at Discharge: Available 24 hours/day;Family Type of Home: Apartment Home Access: Level entry     Home Layout: One level Home Equipment: Shower seat;Grab bars - tub/shower;Walker - 4 wheels      Prior Function Level of Independence: Needs  assistance   Gait / Transfers Assistance Needed: mod I mobility with rollator household distances  ADL's / Homemaking Assistance Needed: Pt. wife would assist wtih shower transfer  and pt. was Mod I with remaining adls.        Hand Dominance   Dominant Hand: Right    Extremity/Trunk Assessment   Upper Extremity Assessment Upper Extremity Assessment: RUE deficits/detail RUE Deficits / Details: R rotator cuff tear    Lower Extremity Assessment Lower Extremity Assessment: Generalized weakness       Communication   Communication: No difficulties  Cognition Arousal/Alertness: Awake/alert Behavior During Therapy: WFL for tasks assessed/performed Overall Cognitive Status: History of cognitive impairments - at baseline                                 General Comments: Oriented and following commands. Vascular dementia at baseline.      General Comments General comments (skin integrity, edema, etc.): VSS on RA    Exercises     Assessment/Plan    PT Assessment Patient needs continued PT services  PT Problem List Decreased strength;Decreased mobility;Decreased activity tolerance;Decreased balance;Pain       PT Treatment Interventions Therapeutic activities;Gait training;Therapeutic exercise;Patient/family education;Balance training;Functional mobility training    PT Goals (Current goals can be found in the Care Plan section)  Acute Rehab PT Goals Patient Stated Goal: home PT Goal Formulation: With patient Time For Goal Achievement: 01/23/21 Potential to Achieve Goals: Good    Frequency Min 3X/week   Barriers to discharge        Co-evaluation               AM-PAC PT "6 Clicks" Mobility  Outcome Measure Help needed turning from your back to your side while in a flat bed without using bedrails?: None Help needed moving from lying on your back to sitting on the side of a flat bed without using bedrails?: None Help needed moving to and from a bed to a chair (including a wheelchair)?: A Little Help needed standing up from a chair using your arms (e.g., wheelchair or bedside chair)?: A Little Help needed to walk in hospital  room?: A Little Help needed climbing 3-5 steps with a railing? : A Little 6 Click Score: 20    End of Session Equipment Utilized During Treatment: Gait belt Activity Tolerance: Patient tolerated treatment well Patient left: in bed;with call bell/phone within reach;with bed alarm set Nurse Communication: Mobility status PT Visit Diagnosis: Unsteadiness on feet (R26.81);Muscle weakness (generalized) (M62.81)    Time: 3704-8889 PT Time Calculation (min) (ACUTE ONLY): 23 min   Charges:   PT Evaluation $PT Eval Moderate Complexity: 1 Mod PT Treatments $Gait Training: 8-22 mins        Lorrin Goodell, PT  Office # [phone] Pager [phone]   Lorriane Shire 01/09/2021, 12:24 PM

## 2021-01-09 NOTE — Discharge Summary (Signed)
Physician Discharge Summary  DIO GILLER EHM:[id number] DOB: [birth year] DOA: 01/07/2021  PCP: Luetta Nutting, DO  Admit date: 01/07/2021 Discharge date: 01/09/2021  Admitted From: Home Disposition:  Home  Recommendations for Outpatient Follow-up:  Follow up with PCP in 1-2 weeks Follow up with outpatient PT  Discharge Condition:Stable CODE STATUS:Full Diet recommendation: Heart healthy   Brief/Interim Summary: 70 year old white male with a prior history of atrial fibrillation, history of aortic stenosis, history of prior stroke, history of permanent pacemaker, status post watchman device status post ASD closure device, history of residual right-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 Pt was placed under observation for further neurologic workup   Discharge Diagnoses:  Principal Problem:   TIA (transient ischemic attack) Active Problems:   Chronic alcoholism in remission ([place])   CKD (chronic kidney disease) stage 3, GFR 30-59 ml/min (HCC)   Essential hypertension   Pacemaker   Restless legs syndrome (RLS)   Paroxysmal atrial fibrillation (HCC) s/p Watchman device   Aortic stenosis   Vascular dementia (HCC)   Weakness    Worsened R sided weakness(transient ischemic attack) Pt was seen by telestroke neurologist.  Pt underwent MRI, reviewed, with no acute process Echo reviewed. Findings of EF 65-70% with no wall motion with interatrial septum not well visualized. Given one time trial of fentanyl IV x1 per Neurology recs Neurology has since signed off and cleared pt for d/c   Essential hypertension BP very well controlled at this time Continue home meds as tolerated   Pacemaker Pt has medtronic pacemaker.   Aortic stenosis Check echo. Pt follows with [facility] cardiology.  Last echo at [facility] 06-2020 showed: 1. The left ventricle is normal in size with mildly  increased wall  thickness.    2. The left ventricular systolic function is normal, LVEF is visually  estimated at 65-70%.    3. The right ventricle is normal in size, with normal systolic function.    4. Closure of atrial septal defect with 18 mm Cribriform device. No shunt  seen by color doppler.    5. There is moderate aortic valve stenosis with a peak velocity of 3.0 m/s,  mean gradient of 22 mmHg, and aortic valve area of 1.2 cm2.    6. Doppler velocity index: 0.33.    7. There is mild to moderate aortic regurgitation.    8. There is no pulmonary hypertension, estimated pulmonary artery systolic  pressure is 39 mmHg.     Chronic alcoholism in remission ([place]) Pt states he has been alcohol-free for 3 years.   CKD (chronic kidney disease) stage 3, GFR 30-59 ml/min (HCC) Remains stable   Restless legs syndrome (RLS) Continue requip as tolerated   Paroxysmal atrial fibrillation (HCC) s/p Watchman device Continue lopressor. On ASA/plavix. S/p watchman. Was placed due to frequent falls.   Vascular dementia ([place]) Stable.    Discharge Instructions  Discharge Instructions     Ambulatory referral to Physical Therapy   Complete by: As directed       Allergies as of 01/09/2021       Reactions   Dilaudid [hydromorphone] Other (See Comments)   Respiratory Arrest   Codeine    Penicillins    Phenobarbital         Medication List     TAKE these medications    acetaminophen 500 MG tablet Commonly known as: TYLENOL Take 1,000 mg by mouth every  6 (six) hours as needed for mild pain.   albuterol 108 (90 Base) MCG/ACT inhaler Commonly known as: VENTOLIN HFA Inhale 2 puffs into the lungs every 4 (four) hours as needed for wheezing or shortness of breath.   allopurinol 100 MG tablet Commonly known as: ZYLOPRIM Take 100 mg by mouth daily.   aspirin 81 MG EC tablet Take 1 tablet by mouth daily.   atorvastatin 80 MG tablet Commonly known as: LIPITOR Take 80 mg by mouth  daily.   clopidogrel 75 MG tablet Commonly known as: PLAVIX Take 75 mg by mouth daily.   escitalopram 20 MG tablet Commonly known as: LEXAPRO Take 20 mg by mouth daily.   ferrous sulfate 325 (65 FE) MG EC tablet Take 325 mg by mouth in the morning and at bedtime.   ketoconazole 2 % cream Commonly known as: NIZORAL Apply 1 application topically 2 (two) times daily as needed (fungal rash on legs).   latanoprost 0.005 % ophthalmic solution Commonly known as: XALATAN Place 1 drop into both eyes at bedtime.   losartan 25 MG tablet Commonly known as: COZAAR Take 25 mg by mouth daily as needed (BP >160).   magnesium oxide 400 MG tablet Commonly known as: MAG-OX Take 400 mg by mouth daily.   metoprolol tartrate 25 MG tablet Commonly known as: LOPRESSOR Take 12.5 mg by mouth 2 (two) times daily.   nitroGLYCERIN 0.4 MG/SPRAY spray Commonly known as: NITROLINGUAL Place 1 spray under the tongue every 5 (five) minutes x 3 doses as needed for chest pain.   pantoprazole 40 MG tablet Commonly known as: PROTONIX Take 40 mg by mouth every evening.   potassium citrate 10 MEQ (1080 MG) SR tablet Commonly known as: UROCIT-K Take 10 mEq by mouth daily.   primidone 50 MG tablet Commonly known as: MYSOLINE Take 100 mg by mouth 2 (two) times daily.   QUEtiapine 50 MG tablet Commonly known as: SEROQUEL Take 100 mg by mouth at bedtime.   ranolazine 1000 MG SR tablet Commonly known as: RANEXA Take 1,000 mg by mouth 2 (two) times daily.   rOPINIRole 3 MG tablet Commonly known as: REQUIP Take 3 mg by mouth at bedtime.        Follow-up Information     Alric Ran, MD. Go on 02/08/2021.   Specialty: Neurology Why: as scheduled. Contact information: [address] [place] [address] [phone]         Luetta Nutting, DO Follow up.   Specialty: Family Medicine Why: Hospital follow up Contact information: [address] [place]  [place] [address] [phone]                Allergies  Allergen Reactions   Dilaudid [Hydromorphone] Other (See Comments)    Respiratory Arrest   Codeine    Penicillins    Phenobarbital     Consultations: Neurology  Procedures/Studies: DG Chest 1 View  Result Date: 12/27/2020 CLINICAL DATA:  Status post slip and fall. EXAM: CHEST  1 VIEW COMPARISON:  December 06, 2020 FINDINGS: There is a dual lead AICD. Stable, diffuse, chronic appearing increased interstitial lung markings are noted. There is no evidence of acute infiltrate, pleural effusion or pneumothorax. The cardiac silhouette is mildly enlarged and unchanged in size. Degenerative changes are noted throughout the thoracic spine. IMPRESSION: Stable cardiomegaly without active cardiopulmonary disease. Electronically Signed   By: Virgina Norfolk M.D.   On: 12/27/2020 22:51   DG Shoulder Right  Result  Date: 12/27/2020 CLINICAL DATA:  Fall and trauma to the right shoulder. EXAM: RIGHT SHOULDER - 2+ VIEW COMPARISON:  Right shoulder radiograph dated 12/16/2020 FINDINGS: There is no acute fracture or dislocation the bones are osteopenic. The soft tissues are unremarkable. IMPRESSION: No acute fracture or dislocation. Electronically Signed   By: Anner Crete M.D.   On: 12/27/2020 22:50   CT Head Wo Contrast  Result Date: 12/27/2020 CLINICAL DATA:  Status post fall. EXAM: CT HEAD WITHOUT CONTRAST TECHNIQUE: Contiguous axial images were obtained from the base of the skull through the vertex without intravenous contrast. COMPARISON:  November 09, 2020 FINDINGS: Brain: There is mild cerebral atrophy with widening of the extra-axial spaces and ventricular dilatation. There are areas of decreased attenuation within the white matter tracts of the supratentorial brain, consistent with microvascular disease changes. Areas of bilateral frontal lobe encephalomalacia, and adjacent chronic white matter low attenuation, are again noted. Vascular: No  hyperdense vessel or unexpected calcification. Skull: A frontal craniotomy defect is seen along the midline. Sinuses/Orbits: Marked severity bilateral maxillary sinus, left ethmoid sinus and bilateral nasal mucosal thickening is seen. Other: None. IMPRESSION: 1. Stable postoperative changes consistent with prior bifrontal craniotomy with areas of bilateral frontal lobe encephalomalacia. 2. No acute intracranial abnormality. 3. Marked severity pansinus disease. Electronically Signed   By: Virgina Norfolk M.D.   On: 12/27/2020 22:41   CT Cervical Spine Wo Contrast  Result Date: 12/27/2020 CLINICAL DATA:  Status post slip and fall. EXAM: CT CERVICAL SPINE WITHOUT CONTRAST TECHNIQUE: Multidetector CT imaging of the cervical spine was performed without intravenous contrast. Multiplanar CT image reconstructions were also generated. COMPARISON:  None. FINDINGS: Alignment: Normal. Skull base and vertebrae: Chronic loss of vertebral body height is seen involving the C5 and C6 vertebral bodies. No primary bone lesion or focal pathologic process. Soft tissues and spinal canal: No prevertebral fluid or swelling. No visible canal hematoma. Disc levels: Moderate to marked severity endplate sclerosis and anterior osteophyte formation is seen at the levels of C4-C5, C5-C6, C6-C7 and C7-T1. Marked severity intervertebral disc space narrowing is seen at the level of C7-T1. Mild to moderate severity multilevel intervertebral disc space narrowing is seen throughout the remainder of the cervical spine. Bilateral moderate to marked severity multilevel facet joint hypertrophy is noted. Upper chest: Negative. Other: None. IMPRESSION: 1. Chronic appearing loss of vertebral body height involving the C5 and C6 vertebral bodies. MRI correlation is recommended if acute cervical spine fracture remains of clinical concern. 2. Moderate to marked severity multilevel degenerative changes, most prominent at the levels of C4-C5, C5-C6, C6-C7 and  C7-T1. Electronically Signed   By: Virgina Norfolk M.D.   On: 12/27/2020 22:45   MR BRAIN W WO CONTRAST  Result Date: 01/08/2021 CLINICAL DATA:  TIA EXAM: MRI HEAD WITHOUT AND WITH CONTRAST TECHNIQUE: Multiplanar, multiecho pulse sequences of the brain and surrounding structures were obtained without and with intravenous contrast. CONTRAST:  47m GADAVIST GADOBUTROL 1 MMOL/ML IV SOLN COMPARISON:  No prior MRI, correlation is made with CT head 01/07/2021. FINDINGS: Brain: No acute infarction, hemorrhage, hydrocephalus, extra-axial collection or mass lesion. Redemonstrated encephalomalacia in the bilateral frontal lobes, subjacent to the craniotomy flap, with sequela of remote infarcts in the right corona radiata and left caudate. T2 hyperintense signal in the periventricular white matter, likely the sequela of chronic small vessel ischemic disease. No abnormal enhancement. Vascular: Normal flow voids. Skull and upper cervical spine: Prior bifrontal cranioplasty. Otherwise normal marrow signal. Sinuses/Orbits: Postoperative changes in the paranasal  sinus with diffuse sinus mucosal thickening, worse in the right maxillary sinus and posterior ethmoid air cells. Other: Fluid in the bilateral mastoid air cells. IMPRESSION: No acute intracranial process. Electronically Signed   By: Merilyn Baba M.D.   On: 01/08/2021 16:51   CT CEREBRAL PERFUSION W CONTRAST  Result Date: 01/08/2021 CLINICAL DATA:  Initial evaluation for acute stroke. EXAM: CT PERFUSION BRAIN TECHNIQUE: Multiphase CT imaging of the brain was performed following IV bolus contrast injection. Subsequent parametric perfusion maps were calculated using RAPID software. CONTRAST:  18m OMNIPAQUE IOHEXOL 350 MG/ML SOLN COMPARISON:  Comparison made with prior head CT and CTA from 01/07/2021. FINDINGS: Please note that there was technical difficulty of this exam, and this study did not become available for review for interpretation until 1:20 a.m. on  01/08/2021 due to technical difficulty. CT Brain Perfusion Findings: CBF (<30%) Volume: 044mPerfusion (Tmax>6.0s) volume: 22754mismatch Volume: 24m41mPECTS on noncontrast CT Head: 10 at 5:47 p.m. on 01/07/2021. Infarct Core: 0 mL Infarction Location:Negative. IMPRESSION: 1. Negative CT perfusion. No evidence for acute ischemia or other perfusion abnormality. Apparent symmetric areas of delayed perfusion involving both cerebral hemispheres are felt to be consistent with artifact. 2. Please note that there was technical difficulty with this exam, and this study did not become available for review or interpretation until 1:20 a.m. on 01/08/21. These results were discussed with Dr. ArorRory Percy1:23 am on 01/08/2021 by Dr. Ben Acquanetta Chainectronically Signed   By: BenjJeannine Boga.   On: 01/08/2021 01:24   ECHOCARDIOGRAM COMPLETE  Result Date: 01/08/2021    ECHOCARDIOGRAM REPORT   Patient Name:   Mark BEEVERSe of Exam: 01/08/2021 Medical Rec #:  [id number]   Height:       73.0 in Accession #:    [id number]  Weight:       240.3 lb Date of Birth:  11/2208-10-1950   BSA:          2.326 m Patient Age:    70 y25rs        BP:           151/99 mmHg Patient Gender: M               HR:           77 bpm. Exam Location:  Inpatient Procedure: 2D Echo, Cardiac Doppler, Color Doppler and Intracardiac            Opacification Agent Indications:    CVA  History:        Patient has no prior history of Echocardiogram examinations. CAD                 and Previous Myocardial Infarction, Stroke and TIA,                 Arrythmias:Bradycardia and Atrial Fibrillation; Risk                 Factors:Hypertension and Dyslipidemia. 07/09/2016 cath                 07/27/2016 EP and ablation                 02/17/2017 pacemaker implant                 09/08/2017 cath                 03/19/2019 Left atrial appendage closure  11/13/2019 ablation                 01/20/2020 ASD closure.  Sonographer:    Luisa Hart RDCS  Referring Phys: [id number] ERIC CHEN  Sonographer Comments: Suboptimal subcostal window, suboptimal apical window, suboptimal parasternal window, Technically challenging study due to limited acoustic windows and patient is morbidly obese. Image acquisition challenging due to patient body habitus. IMPRESSIONS  1. Technically difficult study with very limited views. Left ventricular ejection fraction, by estimation, is 65 to 70%. The left ventricle has normal function. The left ventricle has no regional wall motion abnormalities. There is moderate left ventricular hypertrophy. Left ventricular diastolic parameters are indeterminate.  2. Right ventricular systolic function was not well visualized. The right ventricular size is not well visualized.  3. The mitral valve was not well visualized.  4. Tricuspid valve regurgitation not well visualized.  5. Pulmonic valve regurgitation not well visualized.  6. The aortic valve was not well visualized. Aortic valve regurgitation is not visualized. Limited evaluation of aortic stenosis, appears to be mild AS (MG 54mHg, AVA 1.9 cm^2, DI 0.44) FINDINGS  Left Ventricle: Left ventricular ejection fraction, by estimation, is 65 to 70%. The left ventricle has normal function. The left ventricle has no regional wall motion abnormalities. The left ventricular internal cavity size was normal in size. There is  moderate left ventricular hypertrophy. Left ventricular diastolic parameters are indeterminate. Right Ventricle: The right ventricular size is not well visualized. Right vetricular wall thickness was not well visualized. Right ventricular systolic function was not well visualized. Left Atrium: Left atrial size was not well visualized. Right Atrium: Right atrial size was not well visualized. Pericardium: Trivial pericardial effusion is present. Mitral Valve: The mitral valve was not well visualized. Not well visualized mitral valve regurgitation. Tricuspid Valve: The tricuspid valve is  not well visualized. Tricuspid valve regurgitation not well visualized. Aortic Valve: The aortic valve was not well visualized. Aortic valve regurgitation is not visualized. Mild aortic stenosis is present. Aortic valve mean gradient measures 7.0 mmHg. Aortic valve peak gradient measures 12.0 mmHg. Aortic valve area, by VTI measures 1.94 cm. Pulmonic Valve: The pulmonic valve was not well visualized. Pulmonic valve regurgitation not well visualized. Aorta: The ascending aorta was not well visualized. IAS/Shunts: The interatrial septum was not well visualized.  LEFT VENTRICLE PLAX 2D LVIDd:         4.70 cm  Diastology LVIDs:         4.10 cm  LV e' medial:    4.81 cm/s LV PW:         1.80 cm  LV E/e' medial:  13.6 LV IVS:        1.40 cm  LV e' lateral:   4.43 cm/s LVOT diam:     2.40 cm  LV E/e' lateral: 14.8 LV SV:         70 LV SV Index:   30 LVOT Area:     4.52 cm  LEFT ATRIUM         Index LA diam:    4.80 cm 2.06 cm/m  AORTIC VALVE AV Area (Vmax):    2.12 cm AV Area (Vmean):   1.88 cm AV Area (VTI):     1.94 cm AV Vmax:           173.00 cm/s AV Vmean:          128.000 cm/s AV VTI:            0.362 m AV  Peak Grad:      12.0 mmHg AV Mean Grad:      7.0 mmHg LVOT Vmax:         81.00 cm/s LVOT Vmean:        53.100 cm/s LVOT VTI:          0.155 m LVOT/AV VTI ratio: 0.43 MITRAL VALVE MV Area (PHT): 5.54 cm    SHUNTS MV Decel Time: 137 msec    Systemic VTI:  0.16 m MV E velocity: 65.60 cm/s  Systemic Diam: 2.40 cm MV A velocity: 29.60 cm/s MV E/A ratio:  2.22 Oswaldo Milian MD Electronically signed by Oswaldo Milian MD Signature Date/Time: 01/08/2021/1:27:01 PM    Final    CT HEAD CODE STROKE WO CONTRAST  Result Date: 01/07/2021 CLINICAL DATA:  Code stroke. Neuro deficit, acute, stroke suspected. EXAM: CT HEAD WITHOUT CONTRAST TECHNIQUE: Contiguous axial images were obtained from the base of the skull through the vertex without intravenous contrast. COMPARISON:  Noncontrast head CT 12/27/2020.  FINDINGS: Brain: Redemonstrated foci of chronic encephalomalacia within the anterior frontal lobes bilaterally. Background mild generalized cerebral and cerebellar atrophy. Unchanged focus of chronic small-vessel ischemia within the right corona radiata (for instance as seen on series 4, image 21). Background patchy and ill-defined hypoattenuation within the cerebral white matter, nonspecific but compatible with chronic small vessel ischemic disease. Redemonstrated chronic lacunar infarct within the left caudate nucleus. There is no acute intracranial hemorrhage. No demarcated cortical infarct. No extra-axial fluid collection. No evidence of an intracranial mass. No midline shift. Vascular: No hyperdense vessel.  Atherosclerotic calcifications. Skull: Bifrontal cranioplasty. Sinuses/Orbits: Visualized orbits show no acute finding. Postsurgical appearance of the paranasal sinuses. Paranasal sinus disease. Most notably, there is extensive opacification of the posterior left ethmoid air cells, near complete opacification of the right maxillary sinus and moderate mucosal thickening within the left maxillary sinus. Other: Bilateral mastoid effusions. ASPECTS [facility] Stroke Program Early CT Score) - Ganglionic level infarction (caudate, lentiform nuclei, internal capsule, insula, M1-M3 cortex): 7 - Supraganglionic infarction (M4-M6 cortex): 3 Total score (0-10 with 10 being normal): 10 (when discounting chronic infarcts). These results were called by telephone at the time of interpretation on 01/07/2021 at 5:46 pm to provider MELANIE BELFI , who verbally acknowledged these results. IMPRESSION: No evidence of acute intracranial abnormality. Redemonstrated chronic encephalomalacia within the bilateral frontal lobes, likely posttraumatic. Unchanged focus of chronic small-vessel ischemia within the right frontal lobe white matter. Background chronic small-vessel ischemic changes within the cerebral white matter. Redemonstrated  chronic left basal ganglia lacunar infarct. Mild generalized parenchymal atrophy. Paranasal sinus disease, as described. Bilateral mastoid effusions. Electronically Signed   By: Kellie Simmering D.O.   On: 01/07/2021 17:47   CT ANGIO HEAD NECK W WO CM (CODE STROKE)  Result Date: 01/07/2021 CLINICAL DATA:  Stroke code. Additional history provided by ER physician: Right-sided deficits. EXAM: CT ANGIOGRAPHY HEAD AND NECK TECHNIQUE: Multidetector CT imaging of the head and neck was performed using the standard protocol during bolus administration of intravenous contrast. Multiplanar CT image reconstructions and MIPs were obtained to evaluate the vascular anatomy. Carotid stenosis measurements (when applicable) are obtained utilizing NASCET criteria, using the distal internal carotid diameter as the denominator. CONTRAST:  Administered contrast not known at this time. COMPARISON:  Noncontrast head CT performed earlier today 01/07/2021. FINDINGS: CTA NECK FINDINGS Aortic arch: Standard aortic branching. Atherosclerotic plaque within the visualized aortic arch and proximal major branch vessels of the neck. No hemodynamically significant innominate or proximal subclavian artery stenosis.  Right carotid system: CCA and ICA patent within the neck. Calcified plaque within the carotid bifurcation and proximal ICA. Quantification of stenosis within the proximal right ICA is difficult due to irregularity of calcified plaque at this site. However, stenosis is estimated at up to 50%. Left carotid system: CCA and ICA patent within the neck without significant stenosis (50% or greater). Soft and calcified plaque within the carotid bifurcation and proximal ICA. Vertebral arteries: Vertebral arteries codominant and patent within the neck without hemodynamically significant stenosis. Skeleton: Cervical spondylosis. No acute bony abnormality or aggressive osseous lesion. Other neck: No neck mass or cervical lymphadenopathy. Upper chest: No  consolidation within the imaged lung apices. Review of the MIP images confirms the above findings CTA HEAD FINDINGS Anterior circulation: The intracranial internal carotid arteries are patent. Calcified plaque within both vessels with no more than mild stenosis. The M1 middle cerebral arteries are patent. No M2 proximal branch occlusion is identified. Atherosclerotic irregularity of the M2 and more distal MCA vessels bilaterally. Most notably, there are multifocal severe stenoses within a mid M2 right MCA vessel and a severe stenosis within a distal M2 right MCA vessel. The anterior cerebral arteries are patent. No intracranial aneurysm is identified. Posterior circulation: The intracranial vertebral arteries are patent. Calcified plaque within the proximal V4 left vertebral artery with moderate stenosis. The basilar artery is patent. The posterior cerebral arteries are patent. Posterior communicating arteries are hypoplastic or absent bilaterally. Venous sinuses: Within the limitations of contrast timing, no convincing thrombus. Anatomic variants: As described Review of the MIP images confirms the above findings No intracranial large vessel occlusion identified. These results were called by telephone at the time of interpretation on 01/07/2021 at 6:21 pm to provider Dr. Curly [place], who verbally acknowledged these results. IMPRESSION: CTA neck: 1. The common carotid and internal carotid arteries are patent within the neck. Estimation of stenosis within the proximal right ICA is difficult due to irregularity of calcified plaque at this site, but is estimated at up to 50% stenosis. Mild soft and calcified plaque within the left carotid bifurcation and proximal left ICA with less than 50% stenosis at this site. 2. Vertebral arteries patent within the neck without hemodynamically significant stenosis. CTA head: 1. No intracranial large vessel occlusion identified. 2. Intracranial atherosclerotic disease with multifocal  stenoses, most notably as follows. 3. Multifocal severe stenoses within a mid M2 right MCA vessel. 4. Severe stenosis within a distal M2 right MCA vessel. 5. Atherosclerotic plaque within the intracranial internal carotid arteries with no more than mild stenosis. 6. Moderate atherosclerotic narrowing of the proximal V4 left vertebral artery. Electronically Signed   By: Kellie Simmering D.O.   On: 01/07/2021 18:30    Subjective: Eager to go home  Discharge Exam: Vitals:   01/09/21 0422 01/09/21 0700  BP: 128/73 (!) 122/54  Pulse: 74   Resp: 14   Temp: 98.7 F (37.1 C)   SpO2: 93%    Vitals:   01/09/21 0024 01/09/21 0422 01/09/21 0456 01/09/21 0700  BP:  128/73  (!) 122/54  Pulse:  74    Resp: 16 14    Temp: 98.4 F (36.9 C) 98.7 F (37.1 C)    TempSrc: Oral Oral    SpO2:  93%    Weight:   108.9 kg   Height:        General: Pt is alert, awake, not in acute distress Cardiovascular: RRR, S1/S2 + Respiratory: CTA bilaterally, no wheezing, no rhonchi Abdominal: Soft, NT, ND, bowel  sounds + Extremities: no edema, no cyanosis   The results of significant diagnostics from this hospitalization (including imaging, microbiology, ancillary and laboratory) are listed below for reference.     Microbiology: Recent Results (from the past 240 hour(s))  Resp Panel by RT-PCR (Flu A&B, Covid) Nasopharyngeal Swab     Status: None   Collection Time: 01/07/21  5:18 PM   Specimen: Nasopharyngeal Swab; Nasopharyngeal(NP) swabs in vial transport medium  Result Value Ref Range Status   SARS Coronavirus 2 by RT PCR NEGATIVE NEGATIVE Final    Comment: (NOTE) SARS-CoV-2 target nucleic acids are NOT DETECTED.  The SARS-CoV-2 RNA is generally detectable in upper respiratory specimens during the acute phase of infection. The lowest concentration of SARS-CoV-2 viral copies this assay can detect is 138 copies/mL. A negative result does not preclude SARS-Cov-2 infection and should not be used as the sole  basis for treatment or other patient management decisions. A negative result may occur with  improper specimen collection/handling, submission of specimen other than nasopharyngeal swab, presence of viral mutation(s) within the areas targeted by this assay, and inadequate number of viral copies(<138 copies/mL). A negative result must be combined with clinical observations, patient history, and epidemiological information. The expected result is Negative.  Fact Sheet for Patients:  EntrepreneurPulse.com.au  Fact Sheet for Healthcare Providers:  IncredibleEmployment.be  This test is no t yet approved or cleared by the [place] FDA and  has been authorized for detection and/or diagnosis of SARS-CoV-2 by FDA under an Emergency Use Authorization (EUA). This EUA will remain  in effect (meaning this test can be used) for the duration of the COVID-19 declaration under Section 564(b)(1) of the Act, 21 U.S.C.section 360bbb-3(b)(1), unless the authorization is terminated  or revoked sooner.       Influenza A by PCR NEGATIVE NEGATIVE Final   Influenza B by PCR NEGATIVE NEGATIVE Final    Comment: (NOTE) The Xpert Xpress SARS-CoV-2/FLU/RSV plus assay is intended as an aid in the diagnosis of influenza from Nasopharyngeal swab specimens and should not be used as a sole basis for treatment. Nasal washings and aspirates are unacceptable for Xpert Xpress SARS-CoV-2/FLU/RSV testing.  Fact Sheet for Patients: EntrepreneurPulse.com.au  Fact Sheet for Healthcare Providers: IncredibleEmployment.be  This test is not yet approved or cleared by the [place] FDA and has been authorized for detection and/or diagnosis of SARS-CoV-2 by FDA under an Emergency Use Authorization (EUA). This EUA will remain in effect (meaning this test can be used) for the duration of the COVID-19 declaration under Section 564(b)(1) of the Act,  21 U.S.C. section 360bbb-3(b)(1), unless the authorization is terminated or revoked.  Performed at [facility], [address] [place], [place] [address]      Labs: BNP (last 3 results) Recent Labs    12/06/20 2318  BNP 45.0   Basic Metabolic Panel: Recent Labs  Lab 01/07/21 1750 01/08/21 0318  NA 134* 133*  K 4.9 4.1  CL 99 100  CO2 25 23  GLUCOSE 117* 125*  BUN 27* 24*  CREATININE 1.25* 1.32*  CALCIUM 9.5 9.3   Liver Function Tests: Recent Labs  Lab 01/07/21 1750 01/08/21 0318  AST 37 53*  ALT 31 40  ALKPHOS 60 65  BILITOT 0.5 0.6  PROT 7.2 6.3*  ALBUMIN 4.2 3.4*   No results for input(s): LIPASE, AMYLASE in the last 168 hours. No results for input(s): AMMONIA in the last 168 hours. CBC: Recent Labs  Lab 01/07/21 1750 01/08/21 0318  WBC 6.9 6.9  NEUTROABS 4.0 3.4  HGB 13.5 12.8*  HCT 40.8 38.5*  MCV 95.3 95.1  PLT 206 190   Cardiac Enzymes: No results for input(s): CKTOTAL, CKMB, CKMBINDEX, TROPONINI in the last 168 hours. BNP: Invalid input(s): POCBNP CBG: No results for input(s): GLUCAP in the last 168 hours. D-Dimer No results for input(s): DDIMER in the last 72 hours. Hgb A1c Recent Labs    01/08/21 0138  HGBA1C 6.0*   Lipid Profile Recent Labs    01/07/21 1804  CHOL 131  HDL 47  LDLCALC 54  TRIG 152*  CHOLHDL 2.8   Thyroid function studies No results for input(s): TSH, T4TOTAL, T3FREE, THYROIDAB in the last 72 hours.  Invalid input(s): FREET3 Anemia work up No results for input(s): VITAMINB12, FOLATE, FERRITIN, TIBC, IRON, RETICCTPCT in the last 72 hours. Urinalysis    Component Value Date/Time   COLORURINE YELLOW 01/07/2021 1856   APPEARANCEUR CLEAR 01/07/2021 1856   LABSPEC >1.046 (H) 01/07/2021 1856   PHURINE 7.0 01/07/2021 1856   GLUCOSEU NEGATIVE 01/07/2021 1856   HGBUR NEGATIVE 01/07/2021 1856   BILIRUBINUR NEGATIVE 01/07/2021 1856   KETONESUR NEGATIVE 01/07/2021 1856   PROTEINUR TRACE  (A) 01/07/2021 1856   NITRITE NEGATIVE 01/07/2021 1856   LEUKOCYTESUR TRACE (A) 01/07/2021 1856   Sepsis Labs Invalid input(s): PROCALCITONIN,  WBC,  LACTICIDVEN Microbiology Recent Results (from the past 240 hour(s))  Resp Panel by RT-PCR (Flu A&B, Covid) Nasopharyngeal Swab     Status: None   Collection Time: 01/07/21  5:18 PM   Specimen: Nasopharyngeal Swab; Nasopharyngeal(NP) swabs in vial transport medium  Result Value Ref Range Status   SARS Coronavirus 2 by RT PCR NEGATIVE NEGATIVE Final    Comment: (NOTE) SARS-CoV-2 target nucleic acids are NOT DETECTED.  The SARS-CoV-2 RNA is generally detectable in upper respiratory specimens during the acute phase of infection. The lowest concentration of SARS-CoV-2 viral copies this assay can detect is 138 copies/mL. A negative result does not preclude SARS-Cov-2 infection and should not be used as the sole basis for treatment or other patient management decisions. A negative result may occur with  improper specimen collection/handling, submission of specimen other than nasopharyngeal swab, presence of viral mutation(s) within the areas targeted by this assay, and inadequate number of viral copies(<138 copies/mL). A negative result must be combined with clinical observations, patient history, and epidemiological information. The expected result is Negative.  Fact Sheet for Patients:  EntrepreneurPulse.com.au  Fact Sheet for Healthcare Providers:  IncredibleEmployment.be  This test is no t yet approved or cleared by the [place] FDA and  has been authorized for detection and/or diagnosis of SARS-CoV-2 by FDA under an Emergency Use Authorization (EUA). This EUA will remain  in effect (meaning this test can be used) for the duration of the COVID-19 declaration under Section 564(b)(1) of the Act, 21 U.S.C.section 360bbb-3(b)(1), unless the authorization is terminated  or revoked sooner.        Influenza A by PCR NEGATIVE NEGATIVE Final   Influenza B by PCR NEGATIVE NEGATIVE Final    Comment: (NOTE) The Xpert Xpress SARS-CoV-2/FLU/RSV plus assay is intended as an aid in the diagnosis of influenza from Nasopharyngeal swab specimens and should not be used as a sole basis for treatment. Nasal washings and aspirates are unacceptable for Xpert Xpress SARS-CoV-2/FLU/RSV testing.  Fact Sheet for Patients: EntrepreneurPulse.com.au  Fact Sheet for Healthcare Providers: IncredibleEmployment.be  This test is not yet approved or cleared by the [place] FDA and has been  authorized for detection and/or diagnosis of SARS-CoV-2 by FDA under an Emergency Use Authorization (EUA). This EUA will remain in effect (meaning this test can be used) for the duration of the COVID-19 declaration under Section 564(b)(1) of the Act, 21 U.S.C. section 360bbb-3(b)(1), unless the authorization is terminated or revoked.  Performed at [facility], [address], [place],  [address]    Time spent: 30  SIGNED:   Marylu Lund, MD  [facility] 01/09/2021, 12:55 PM  If 7PM-7AM, please contact night-coverage

## 2021-01-11 ENCOUNTER — Telehealth: Payer: Self-pay | Admitting: General Practice

## 2021-01-11 NOTE — Telephone Encounter (Signed)
Transition Care Management Unsuccessful Follow-up Telephone Call  Date of discharge and from where:  01/09/21 from [facility]   Attempts:  1st Attempt  Reason for unsuccessful TCM follow-up call:  Left voice message

## 2021-01-13 NOTE — Telephone Encounter (Signed)
I have reached out to our [facility] downstairs asking for them to take this referral and schedule

## 2021-01-13 NOTE — Telephone Encounter (Signed)
Transition Care Management Unsuccessful Follow-up Telephone Call  Date of discharge and from where:  01/09/21 from [facility]  Attempts:  2nd Attempt  Reason for unsuccessful TCM follow-up call:  Left voice message

## 2021-01-15 NOTE — Telephone Encounter (Signed)
Transition Care Management Unsuccessful Follow-up Telephone Call  Date of discharge and from where:  01/18/21 from [facility]  Attempts:  3rd Attempt  Reason for unsuccessful TCM follow-up call:  Left voice message Patient does have OV scheduled for 01/18/21

## 2021-01-18 ENCOUNTER — Ambulatory Visit: Payer: TRICARE For Life (TFL) | Admitting: Family Medicine

## 2021-01-21 ENCOUNTER — Ambulatory Visit: Payer: TRICARE For Life (TFL) | Admitting: Family Medicine

## 2021-01-26 ENCOUNTER — Encounter: Payer: Self-pay | Admitting: Family Medicine

## 2021-01-26 ENCOUNTER — Ambulatory Visit (INDEPENDENT_AMBULATORY_CARE_PROVIDER_SITE_OTHER): Payer: Medicare Other | Admitting: Family Medicine

## 2021-01-26 ENCOUNTER — Other Ambulatory Visit: Payer: Self-pay

## 2021-01-26 DIAGNOSIS — G459 Transient cerebral ischemic attack, unspecified: Secondary | ICD-10-CM

## 2021-01-26 DIAGNOSIS — F0150 Vascular dementia without behavioral disturbance: Secondary | ICD-10-CM | POA: Diagnosis not present

## 2021-01-26 DIAGNOSIS — G2581 Restless legs syndrome: Secondary | ICD-10-CM

## 2021-01-26 DIAGNOSIS — F323 Major depressive disorder, single episode, severe with psychotic features: Secondary | ICD-10-CM

## 2021-01-26 DIAGNOSIS — F5101 Primary insomnia: Secondary | ICD-10-CM

## 2021-01-26 DIAGNOSIS — G250 Essential tremor: Secondary | ICD-10-CM | POA: Diagnosis not present

## 2021-01-26 NOTE — Assessment & Plan Note (Signed)
Continues to have RLS symptoms despite Requip.  Will discuss further with neurology.

## 2021-01-26 NOTE — Assessment & Plan Note (Signed)
Currently taking primidone, stable at this time.

## 2021-01-26 NOTE — Progress Notes (Signed)
Mark Thomas - 70 y.o. male MRN [id number]  Date of birth: Oct 08, 1950  Subjective Chief Complaint  Patient presents with   Depression   Hypertension    HPI Mark Thomas is a 70 year old male here today for follow-up of depression and hypertension.  Since his last visit with me he was hospitalized for possible strokelike symptoms.  Work-up was negative for new acute CVA.  He was diagnosed with TIA.    Blood pressure is well controlled at this time.  He is tolerating current medications well.  He does report increased depressive symptoms.  He has weaned from both trazodone and hydroxyzine.  He actually feels better being off the hydroxyzine and feels that thinking is clear.  He also has not noted any significant change in sleep since discontinuing these medications.  He does have an upcoming visit with psychiatry next week.  He is interested in seeing a therapist.  ROS:  A comprehensive ROS was completed and negative except as noted per HPI  Allergies  Allergen Reactions   Dilaudid [Hydromorphone] Other (See Comments)    Respiratory Arrest   Codeine    Penicillins    Phenobarbital     Past Medical History:  Diagnosis Date   Anxiety    Coronary artery disease    Depression    Essential tremor    GERD (gastroesophageal reflux disease)    High cholesterol    Hypertension    Myocardial infarct (HCC)    Orthostatic hypotension    Stroke ([place])    Vascular dementia ([place])     Past Surgical History:  Procedure Laterality Date   APPENDECTOMY     CARDIAC SURGERY     CHOLECYSTECTOMY     HERNIA REPAIR     NASAL SINUS SURGERY     SHOULDER SURGERY      Social History   Socioeconomic History   Marital status: Married    Spouse name: Not on file   Number of children: Not on file   Years of education: Not on file   Highest education level: Not on file  Occupational History   Not on file  Tobacco Use   Smoking status: Never    Passive exposure: Never   Smokeless tobacco:  Never  Vaping Use   Vaping Use: Never used  Substance and Sexual Activity   Alcohol use: Not Currently   Drug use: Never   Sexual activity: Not on file  Other Topics Concern   Not on file  Social History Narrative   Not on file   Social Determinants of Health   Financial Resource Strain: Not on file  Food Insecurity: Not on file  Transportation Needs: Not on file  Physical Activity: Not on file  Stress: Not on file  Social Connections: Not on file    Family History  Problem Relation Age of Onset   Hypertension Mother    Stroke Mother    Stroke Father    Hypertension Father     Health Maintenance  Topic Date Due   Hepatitis C Screening  Never done   Zoster Vaccines- Shingrix (1 of 2) Never done   COVID-19 Vaccine (4 - Booster for Moderna series) 07/09/2020   INFLUENZA VACCINE  11/30/2020   TETANUS/TDAP  09/19/2024   COLONOSCOPY (Pts 45-77yr Insurance coverage will need to be confirmed)  08/09/2025   HPV VACCINES  Aged Out     ----------------------------------------------------------------------------------------------------------------------------------------------------------------------------------------------------------------- Physical Exam BP 110/68 (BP Location: Left Arm, Patient Position: Sitting, Cuff Size: Normal)  Pulse 79   Ht _0  (1.854 m)   Wt 248 lb (112.5 kg)   SpO2 98%   BMI 32.72 kg/m   Physical Exam Constitutional:      Appearance: Normal appearance.  Eyes:     General: No scleral icterus. Cardiovascular:     Rate and Rhythm: Normal rate and regular rhythm.  Pulmonary:     Effort: Pulmonary effort is normal.     Breath sounds: Normal breath sounds.  Musculoskeletal:     Cervical back: Neck supple.  Neurological:     General: No focal deficit present.     Mental Status: He is alert.  Psychiatric:        Mood and Affect: Mood normal.        Behavior: Behavior normal.     ------------------------------------------------------------------------------------------------------------------------------------------------------------------------------------------------------------------- Assessment and Plan  Vascular dementia ([place]) Stable at this time.  He does have upcoming appointment with neurology.  Essential tremor Currently taking primidone, stable at this time.  Restless legs syndrome (RLS) Continues to have RLS symptoms despite Requip.  Will discuss further with neurology.  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  He has an appointment with psychiatry next week.  Given resources for local counselors.  Insomnia He is weaned from trazodone and hydroxyzine.  Feels that insomnia is stable at this time.   TIA (transient ischemic attack) Recent TIA.  No new findings on neuroimaging.  He will continue with current medications for management of risk factors.   No orders of the defined types were placed in this encounter.   Return in about 3 months (around 04/27/2021) for HTN/Dementia.  35 minutes spent including pre visit preparation, review of prior notes and labs, encounter with patient and same day documentation.   This visit occurred during the SARS-CoV-2 public health emergency.  Safety protocols were in place, including screening questions prior to the visit, additional usage of staff PPE, and extensive cleaning of exam room while observing appropriate contact time as indicated for disinfecting solutions.

## 2021-01-26 NOTE — Assessment & Plan Note (Signed)
Recent TIA.  No new findings on neuroimaging.  He will continue with current medications for management of risk factors.

## 2021-01-26 NOTE — Patient Instructions (Addendum)
Great to see you today! Try adding vitamin d 2000-5000 units daily.  Let's plan to follow up in 3-4 months.

## 2021-01-26 NOTE — Assessment & Plan Note (Signed)
Stable at this time.  He does have upcoming appointment with neurology.

## 2021-01-26 NOTE — Assessment & Plan Note (Signed)
Does not feel that Lexapro is quite as effective as it has been in the past.  He has an appointment with psychiatry next week.  Given resources for local counselors.

## 2021-01-26 NOTE — Assessment & Plan Note (Signed)
He is weaned from trazodone and hydroxyzine.  Feels that insomnia is stable at this time.

## 2021-02-02 DIAGNOSIS — I639 Cerebral infarction, unspecified: Secondary | ICD-10-CM | POA: Insufficient documentation

## 2021-02-02 DIAGNOSIS — I219 Acute myocardial infarction, unspecified: Secondary | ICD-10-CM | POA: Insufficient documentation

## 2021-02-02 DIAGNOSIS — F419 Anxiety disorder, unspecified: Secondary | ICD-10-CM | POA: Insufficient documentation

## 2021-02-02 HISTORY — DX: Cerebral infarction, unspecified: I63.9

## 2021-02-03 ENCOUNTER — Other Ambulatory Visit: Payer: Self-pay

## 2021-02-03 ENCOUNTER — Encounter: Payer: Self-pay | Admitting: Cardiology

## 2021-02-03 ENCOUNTER — Ambulatory Visit (INDEPENDENT_AMBULATORY_CARE_PROVIDER_SITE_OTHER): Payer: Medicare Other | Admitting: Cardiology

## 2021-02-03 VITALS — BP 92/60 | HR 73 | Ht 73.0 in | Wt 253.0 lb

## 2021-02-03 DIAGNOSIS — Z955 Presence of coronary angioplasty implant and graft: Secondary | ICD-10-CM | POA: Diagnosis not present

## 2021-02-03 DIAGNOSIS — I2510 Atherosclerotic heart disease of native coronary artery without angina pectoris: Secondary | ICD-10-CM | POA: Diagnosis not present

## 2021-02-03 DIAGNOSIS — Z950 Presence of cardiac pacemaker: Secondary | ICD-10-CM

## 2021-02-03 DIAGNOSIS — Z9889 Other specified postprocedural states: Secondary | ICD-10-CM

## 2021-02-03 DIAGNOSIS — I480 Paroxysmal atrial fibrillation: Secondary | ICD-10-CM

## 2021-02-03 DIAGNOSIS — F1021 Alcohol dependence, in remission: Secondary | ICD-10-CM

## 2021-02-03 NOTE — Progress Notes (Signed)
Cardiology Office Note:    Date:  02/03/2021   ID:  Mark Thomas, DOB 09-07-1950, MRN [id number]  PCP:  Luetta Nutting, DO  Cardiologist:  Jenne Campus, MD    Referring MD: Frann Rider*   Chief Complaint  Patient presents with   Establish Care    History of Present Illness:    Mark Thomas is a 70 y.o. male with a very complex past medical history.  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  Also history of hypertension which seems to be somewhat difficult to control, sick sinus syndrome with required permanent pacemaker history of alcohol abuse but now in remission for last 3 years aortic stenosis which was assessed in January 2022 as moderate with mean gradient of 20 dimensionless index 0.37 repeated echocardiogram done in September this year in [place] showed mild aortic stenosis. He would like to be established as a patient my practice.  He is moving to this area and overall seems to be doing well the biggest problem is balance issue.  Apparently he is going to physical therapy to improve it.  Also have a chronic problem with the right shoulder and surgery is contemplated.  However he was clearly told that surgery will not be done unless his balance will improve.  Cardiac wise he described to have chest pain like he have every single day he does not take nitroglycerin for it and overall does not think that this is worst interestingly they brought an issue day meaning him and his wife about potentially discontinuation of ranolazine however I advised against that.  Couple years ago to try to stop From nose and he ended up  having a lot of chest pain.  There is minimal swelling of lower extremities he does not have much shortness of breath but his ability to exercise is limited.  Past Medical History:  Diagnosis Date   Anxiety    Coronary artery disease    Depression    Essential tremor    GERD (gastroesophageal reflux disease)    High cholesterol    Hypertension    Myocardial infarct (HCC)    Orthostatic hypotension    Stroke ([place])    Vascular dementia ([place])     Past Surgical History:  Procedure Laterality Date   APPENDECTOMY     CARDIAC SURGERY     CHOLECYSTECTOMY     HERNIA REPAIR     NASAL SINUS SURGERY     SHOULDER SURGERY      Current Medications: Current Meds  Medication Sig   acetaminophen (TYLENOL) 500 MG tablet Take 1,000 mg by mouth every 6 (six) hours as needed for mild pain.   albuterol (VENTOLIN HFA) 108 (90 Base) MCG/ACT inhaler Inhale 2 puffs into the lungs every 4 (four) hours as needed for wheezing or shortness of breath.   allopurinol (ZYLOPRIM) 100 MG tablet Take 100 mg by mouth daily.   aspirin 81 MG EC tablet Take 1 tablet by mouth daily.   atorvastatin (LIPITOR) 80 MG tablet Take 80 mg by mouth daily.   Cholecalciferol (VITAMIN D-3) 125 MCG (5000 UT) TABS Take 1 tablet by mouth daily.  clopidogrel (PLAVIX) 75 MG tablet Take 75 mg by mouth daily.   escitalopram (LEXAPRO) 20 MG tablet Take 20 mg by mouth daily.   ferrous sulfate 325 (65 FE) MG EC tablet Take 325 mg by mouth in the morning and at bedtime.   latanoprost (XALATAN) 0.005 % ophthalmic solution Place 1 drop into both eyes at bedtime.   losartan (COZAAR) 25 MG tablet Take 25 mg by mouth daily as needed (BP >160).   magnesium oxide (MAG-OX) 400 MG tablet Take 400 mg by mouth daily.   metoprolol tartrate (LOPRESSOR) 25 MG tablet Take 12.5 mg by mouth 2 (two) times daily.   nitroGLYCERIN (NITROLINGUAL) 0.4 MG/SPRAY spray Place 1 spray under the tongue every 5 (five) minutes x 3 doses as needed for chest pain.    pantoprazole (PROTONIX) 40 MG tablet Take 40 mg by mouth every evening.   potassium citrate (UROCIT-K) 10 MEQ (1080 MG) SR tablet Take 10 mEq by mouth daily.   primidone (MYSOLINE) 50 MG tablet Take 100 mg by mouth 2 (two) times daily.   QUEtiapine (SEROQUEL) 50 MG tablet Take 100 mg by mouth at bedtime.   ranolazine (RANEXA) 1000 MG SR tablet Take 1,000 mg by mouth 2 (two) times daily.   rOPINIRole (REQUIP) 3 MG tablet Take 3 mg by mouth at bedtime.   triamcinolone ointment (KENALOG) 0.1 % Apply 1 application topically 2 (two) times daily.     Allergies:   Dilaudid [hydromorphone], Codeine, Penicillins, and Phenobarbital   Social History   Socioeconomic History   Marital status: Married    Spouse name: Not on file   Number of children: Not on file   Years of education: Not on file   Highest education level: Not on file  Occupational History   Not on file  Tobacco Use   Smoking status: Never    Passive exposure: Never   Smokeless tobacco: Never  Vaping Use   Vaping Use: Never used  Substance and Sexual Activity   Alcohol use: Not Currently   Drug use: Never   Sexual activity: Not on file  Other Topics Concern   Not on file  Social History Narrative   Not on file   Social Determinants of Health   Financial Resource Strain: Not on file  Food Insecurity: Not on file  Transportation Needs: Not on file  Physical Activity: Not on file  Stress: Not on file  Social Connections: Not on file     Family History: The patient's family history includes Hypertension in his father and mother; Stroke in his father and mother. ROS:   Please see the history of present illness.    All 14 point review of systems negative except as described per history of present illness  EKGs/Labs/Other Studies Reviewed:      Recent Labs: 12/06/2020: B Natriuretic Peptide 64.4 01/08/2021: ALT 40; BUN 24; Creatinine, Ser 1.32; Hemoglobin 12.8; Platelets 190; Potassium 4.1; Sodium 133  Recent Lipid  Panel    Component Value Date/Time   CHOL 131 01/07/2021 1804   TRIG 152 (H) 01/07/2021 1804   HDL 47 01/07/2021 1804   CHOLHDL 2.8 01/07/2021 1804   VLDL 30 01/07/2021 1804   LDLCALC 54 01/07/2021 1804    Physical Exam:    VS:  BP 92/60 (BP Location: Left Arm, Patient Position: Sitting)   Pulse 73   Ht _0  (1.854 m)   Wt 253 lb (114.8 kg)   SpO2 95%   BMI 33.38 kg/m  Wt Readings from Last 3 Encounters:  02/03/21 253 lb (114.8 kg)  01/26/21 248 lb (112.5 kg)  01/09/21 240 lb 1.3 oz (108.9 kg)     GEN:  Well nourished, well developed in no acute distress HEENT: Normal NECK: No JVD; No carotid bruits LYMPHATICS: No lymphadenopathy CARDIAC: RRR, systolic ejection murmur grade 3/6 best heard right upper portion of the sternum, no rubs, no gallops RESPIRATORY:  Clear to auscultation without rales, wheezing or rhonchi  ABDOMEN: Soft, non-tender, non-distended MUSCULOSKELETAL:  No edema; No deformity  SKIN: Warm and dry LOWER EXTREMITIES: no swelling NEUROLOGIC:  Alert and oriented x 3 PSYCHIATRIC:  Normal affect   ASSESSMENT:    1. Coronary arteriosclerosis   2. Pacemaker   3. History of cardiac radiofrequency ablation (RFA)   4. Presence of stent in LAD coronary artery   5. 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 Paroxysmal atrial fibrillation (HCC) s/p Watchman device    PLAN:    In order of problems listed above:  Coronary artery disease status post PTCA and stenting done years ago last cardiac catheterization March 2021 showing no critical lesions.  We will continue dual antiplatelet therapy as well as risk modification which include statin therapy that he is already on and I will continue that. Pacemaker present, then process of making decision whether they would like to follow-up the device either in the [facility] or with [place].  They will let me know. Paroxysmal atrial fibrillation with very high CHADS2 Vascor but not anticoagulated I suspect because of  frequent falls, he does have watchman device.  He is also on aspirin Plavix which I will continue.  He denies having any palpitations.  His amiodarone has been withdrawn.  We will wait for interrogation of the device to see if there are any recurrences of atrial fibrillation. Aortic stenosis mild based on last investigation.  We will continue monitoring.  We discussed in details this problem. Dyslipidemia he is a HDL is 47 LDL of 54 he is on atorvastatin 80 which I will continue.  Overall he is a sick gentleman but it looks like he had excellent care at [facility].  We will follow him in December.   Medication Adjustments/Labs and Tests Ordered: Current medicines are reviewed at length with the patient today.  Concerns regarding medicines are outlined above.  No orders of the defined types were placed in this encounter.  Medication changes: No orders of the defined types were placed in this encounter.   Signed, Park Liter, MD, [facility] 02/03/2021 12:22 PM    [place]

## 2021-02-03 NOTE — Patient Instructions (Signed)
Medication Instructions:  Your physician recommends that you continue on your current medications as directed. Please refer to the Current Medication list given to you today.  *If you need a refill on your cardiac medications before your next appointment, please call your pharmacy*   Lab Work: None If you have labs (blood work) drawn today and your tests are completely normal, you will receive your results only by: [place] (if you have MyChart) OR A paper copy in the mail If you have any lab test that is abnormal or we need to change your treatment, we will call you to review the results.   Testing/Procedures: None   Follow-Up: At [facility], you and your health needs are our priority.  As part of our continuing mission to provide you with exceptional heart care, we have created designated Provider Care Teams.  These Care Teams include your primary Cardiologist (physician) and Advanced Practice Providers (APPs -  Physician Assistants and Nurse Practitioners) who all work together to provide you with the care you need, when you need it.  We recommend signing up for the patient portal called "MyChart".  Sign up information is provided on this After Visit Summary.  MyChart is used to connect with patients for Virtual Visits (Telemedicine).  Patients are able to view lab/test results, encounter notes, upcoming appointments, etc.  Non-urgent messages can be sent to your provider as well.   To learn more about what you can do with MyChart, go to [email or web address].    Your next appointment:   2 month(s)  The format for your next appointment:   In Person  Provider:   Jenne Campus, MD   Other Instructions

## 2021-02-04 ENCOUNTER — Other Ambulatory Visit: Payer: Self-pay | Admitting: Family Medicine

## 2021-02-04 ENCOUNTER — Encounter: Payer: Self-pay | Admitting: Family Medicine

## 2021-02-04 MED ORDER — ESCITALOPRAM OXALATE 20 MG PO TABS: 20.0000 mg | ORAL_TABLET | Freq: Every day | ORAL | 0 refills | Status: DC

## 2021-02-08 ENCOUNTER — Ambulatory Visit (INDEPENDENT_AMBULATORY_CARE_PROVIDER_SITE_OTHER): Payer: Medicare Other | Admitting: Neurology

## 2021-02-08 ENCOUNTER — Encounter: Payer: Self-pay | Admitting: Neurology

## 2021-02-08 VITALS — BP 111/70 | HR 77 | Ht 73.0 in | Wt 253.0 lb

## 2021-02-08 DIAGNOSIS — R5383 Other fatigue: Secondary | ICD-10-CM | POA: Diagnosis not present

## 2021-02-08 DIAGNOSIS — F01B0 Vascular dementia, moderate, without behavioral disturbance, psychotic disturbance, mood disturbance, and anxiety: Secondary | ICD-10-CM | POA: Diagnosis not present

## 2021-02-08 DIAGNOSIS — R6889 Other general symptoms and signs: Secondary | ICD-10-CM | POA: Diagnosis not present

## 2021-02-08 MED ORDER — MEMANTINE HCL 5 MG PO TABS: 5.0000 mg | ORAL_TABLET | Freq: Two times a day (BID) | ORAL | 2 refills | Status: DC

## 2021-02-08 NOTE — Progress Notes (Signed)
[facility]  PATIENT: Mark Thomas DOB: 03-Apr-1951  REFERRING CLINICIAN: Luetta Nutting, DO HISTORY FROM: Patient and spouse  REASON FOR VISIT: Worsening memory.    HISTORICAL  CHIEF COMPLAINT:  Chief Complaint  Patient presents with   New Patient (Initial Visit)    Rm 13, with wife, here to discuss worsening memory     HISTORY OF PRESENT ILLNESS:  This is a 70 year old man with past medical history of anxiety depression, essential tremor, CAD, hypertension, hyperlipidemia, stroke and TIAs and vascular dementia who is presenting for worsening memory.  Per wife patient had 3 strokes in the last year and a half and in the early spring of this year he was diagnosed with vascular dementia.  Per history patient also had a formal neuropsych testing which confirmed the diagnosis of dementia.  From a strokes he has residual right-sided weakness.  He has not been started on any medication for dementia.  Patient and wife recently moved from [place] in July and they also want to establish care with a local neurologist.  Currently his memory problems are described as repeating the same stuff over and over, asking the wife the same questions, asking about upcoming appointments, sometime is confused about the days.  He is currently not driving, after his third stroke and with his worsening right-sided weakness he was told not to drive.  Wife is handling all the finances, she had always handled the finances.  He reported he is not doing any cooking.  Wife also reported she had to set up his medications and sometimes remind him to take his medications otherwise he will forget.  She also has to remind him about his hygiene, remind him to brush his teeth, remind him to take a shower.   Patient also reported he has been more depressed than before.  He is supposed to see a psychiatrist next week.  He reported that his depression is taking a toll on his day to day activity, making his  memory worse.  He gives the example of going out of town for a wedding but due to his depression the day of the wedding, he could not get out of bed, he reported he missed the wedding and he felt bad about it.  Patient reported history of shoulder injury, he is awaiting surgery.  Due to his upcoming surgery he has been doing physical therapy, he reported he is keeping up with physical therapy and actually he seemed to benefit from physical therapy.  He also doing exercise at home. For his restless leg syndrome he is on Requip.  He is also diagnosed with essential tremor since July of last year after a second stroke.  He is on primidone 100 mg twice daily reported good control of his symptoms.   OTHER MEDICAL CONDITIONS: Anxiety/Depression, essential tremors, CAD, HTN, HLD, GERD, Stroke TIA, Vascular dementia   REVIEW OF SYSTEMS: Full 14 system review of systems performed and negative with exception of: as noted in the HPI  ALLERGIES: Allergies  Allergen Reactions   Dilaudid [Hydromorphone] Other (See Comments)    Respiratory Arrest   Codeine    Penicillins    Phenobarbital     HOME MEDICATIONS: Outpatient Medications Prior to Visit  Medication Sig Dispense Refill   acetaminophen (TYLENOL) 500 MG tablet Take 1,000 mg by mouth every 6 (six) hours as needed for mild pain.     albuterol (VENTOLIN HFA) 108 (90 Base) MCG/ACT inhaler Inhale 2 puffs into the lungs every  4 (four) hours as needed for wheezing or shortness of breath.     allopurinol (ZYLOPRIM) 100 MG tablet Take 100 mg by mouth daily.     aspirin 81 MG EC tablet Take 1 tablet by mouth daily.     atorvastatin (LIPITOR) 80 MG tablet Take 80 mg by mouth daily.     Cholecalciferol (VITAMIN D-3) 125 MCG (5000 UT) TABS Take 1 tablet by mouth daily.     clopidogrel (PLAVIX) 75 MG tablet Take 75 mg by mouth daily.     escitalopram (LEXAPRO) 20 MG tablet Take 1 tablet (20 mg total) by mouth daily. 30 tablet 0   ferrous sulfate 325 (65  FE) MG EC tablet Take 325 mg by mouth in the morning and at bedtime.     latanoprost (XALATAN) 0.005 % ophthalmic solution Place 1 drop into both eyes at bedtime.     losartan (COZAAR) 25 MG tablet Take 25 mg by mouth daily as needed (BP >160).     magnesium oxide (MAG-OX) 400 MG tablet Take 400 mg by mouth daily.     metoprolol tartrate (LOPRESSOR) 25 MG tablet Take 12.5 mg by mouth 2 (two) times daily.     nitroGLYCERIN (NITROLINGUAL) 0.4 MG/SPRAY spray Place 1 spray under the tongue every 5 (five) minutes x 3 doses as needed for chest pain.     pantoprazole (PROTONIX) 40 MG tablet Take 40 mg by mouth every evening.     potassium citrate (UROCIT-K) 10 MEQ (1080 MG) SR tablet Take 10 mEq by mouth daily.     primidone (MYSOLINE) 50 MG tablet Take 100 mg by mouth 2 (two) times daily.     QUEtiapine (SEROQUEL) 50 MG tablet Take 100 mg by mouth at bedtime.     ranolazine (RANEXA) 1000 MG SR tablet Take 1,000 mg by mouth 2 (two) times daily.     rOPINIRole (REQUIP) 3 MG tablet Take 3 mg by mouth at bedtime.     triamcinolone ointment (KENALOG) 0.1 % Apply 1 application topically 2 (two) times daily.     No facility-administered medications prior to visit.    PAST MEDICAL HISTORY: Past Medical History:  Diagnosis Date   Anxiety    Coronary artery disease    Depression    Essential tremor    GERD (gastroesophageal reflux disease)    High cholesterol    Hypertension    Myocardial infarct (HCC)    Orthostatic hypotension    Stroke ([place])    Vascular dementia ([place])     PAST SURGICAL HISTORY: Past Surgical History:  Procedure Laterality Date   APPENDECTOMY     CARDIAC SURGERY     CHOLECYSTECTOMY     HERNIA REPAIR     NASAL SINUS SURGERY     SHOULDER SURGERY      FAMILY HISTORY: Family History  Problem Relation Age of Onset   Hypertension Mother    Stroke Mother    Stroke Father    Hypertension Father     SOCIAL HISTORY: Social History   Socioeconomic History   Marital  status: Married    Spouse name: Not on file   Number of children: Not on file   Years of education: Not on file   Highest education level: Not on file  Occupational History   Not on file  Tobacco Use   Smoking status: Never    Passive exposure: Never   Smokeless tobacco: Never  Vaping Use   Vaping Use: Never used  Substance and  Sexual Activity   Alcohol use: Not Currently   Drug use: Never   Sexual activity: Not on file  Other Topics Concern   Not on file  Social History Narrative   Not on file   Social Determinants of Health   Financial Resource Strain: Not on file  Food Insecurity: Not on file  Transportation Needs: Not on file  Physical Activity: Not on file  Stress: Not on file  Social Connections: Not on file  Intimate Partner Violence: Not on file     PHYSICAL EXAM  GENERAL EXAM/CONSTITUTIONAL: Vitals:  Vitals:   02/08/21 1135  BP: 111/70  Pulse: 77  Weight: 253 lb (114.8 kg)  Height: _0  (1.854 m)   Body mass index is 33.38 kg/m. Wt Readings from Last 3 Encounters:  02/08/21 253 lb (114.8 kg)  02/03/21 253 lb (114.8 kg)  01/26/21 248 lb (112.5 kg)   Patient is in no distress; well developed, nourished and groomed; neck is supple  EYES: Pupils round and reactive to light, Visual fields full to confrontation, Extraocular movements intacts,   MUSCULOSKELETAL: Gait, strength, tone, movements noted in Neurologic exam below  NEUROLOGIC: MENTAL STATUS:  MMSE - Randall Exam 02/08/2021  Orientation to time 5  Orientation to Place 5  Registration 3  Attention/ Calculation 5  Recall 3  Language- name 2 objects 2  Language- repeat 1  Language- follow 3 step command 3  Language- read & follow direction 1  Write a sentence 1  Copy design 1  Total score 30    CRANIAL NERVE:  2nd, 3rd, 4th, 6th - pupils equal and reactive to light, visual fields full to confrontation, extraocular muscles intact, no nystagmus 5th - facial sensation  symmetric 7th - facial strength symmetric 8th - hearing intact 9th - palate elevates symmetrically, uvula midline 11th - shoulder shrug symmetric 12th - tongue protrusion midline  MOTOR:  normal bulk and tone, full strength in the BUE, BLE  SENSORY:  normal and symmetric to light touch, pinprick, temperature, vibration  COORDINATION:  finger-nose-finger, fine finger movements normal  REFLEXES:  deep tendon reflexes present and symmetric  GAIT/STATION:  Walks with a  walker     DIAGNOSTIC DATA (LABS, IMAGING, TESTING) - I reviewed patient records, labs, notes, testing and imaging myself where available.  Lab Results  Component Value Date   WBC 6.9 01/08/2021   HGB 12.8 (L) 01/08/2021   HCT 38.5 (L) 01/08/2021   MCV 95.1 01/08/2021   PLT 190 01/08/2021      Component Value Date/Time   NA 133 (L) 01/08/2021 0318   K 4.1 01/08/2021 0318   CL 100 01/08/2021 0318   CO2 23 01/08/2021 0318   GLUCOSE 125 (H) 01/08/2021 0318   BUN 24 (H) 01/08/2021 0318   CREATININE 1.32 (H) 01/08/2021 0318   CALCIUM 9.3 01/08/2021 0318   PROT 6.3 (L) 01/08/2021 0318   ALBUMIN 3.4 (L) 01/08/2021 0318   AST 53 (H) 01/08/2021 0318   ALT 40 01/08/2021 0318   ALKPHOS 65 01/08/2021 0318   BILITOT 0.6 01/08/2021 0318   GFRNONAA 59 (L) 01/08/2021 0318   Lab Results  Component Value Date   CHOL 131 01/07/2021   HDL 47 01/07/2021   LDLCALC 54 01/07/2021   TRIG 152 (H) 01/07/2021   CHOLHDL 2.8 01/07/2021   Lab Results  Component Value Date   HGBA1C 6.0 (H) 01/08/2021   No results found for: VITAMINB12 No results found for: TSH  MRI Brain  01/08/2021:  No acute intracranial process.    ASSESSMENT AND PLAN  70 y.o. year old male with past medical history of anxiety depression, essential tremor, CAD, hypertension, hyperlipidemia, stroke and TIAs and vascular dementia who is presenting for worsening memory.  Memory issue described per wife as always have to remind him to take his  medication, upcoming appointment, sometimes has to remind her to behave or brushes teeth.  Wife is handling the finances, cooking and driving.  Today on exam he scored a 30 out of 30 on the Mini-Mental status exam.  But based on history and previous stroke patient likely have vascular dementia.  I will start him on Namenda 5 mg twice daily and also check his TSH and B12 level.  I will contact the patient to discuss the result.  I will follow-up in 45-monthand if symptoms not improve we can increase the Namenda to 10 mg twice daily.   1. Moderate vascular dementia without behavioral disturbance, psychotic disturbance, mood disturbance, or anxiety   2. Other fatigue    3. Other general symptoms and signs       PLAN: Start with Namenda 5 mg BID  Will check TSH and Vitamin B12 level today  Return in 6 months or sooner    There are well-accepted and sensible ways to reduce risk for Alzheimers disease and other degenerative brain disorders .  Exercise Daily Walk A daily 20 minute walk should be part of your routine. Disease related apathy can be a significant roadblock to exercise and the only way to overcome this is to make it a daily routine and perhaps have a reward at the end (something your loved one loves to eat or drink perhaps) or a personal trainer coming to the home can also be very useful. Most importantly, the patient is much more likely to exercise if the caregiver / spouse does it with him/her. In general a structured, repetitive schedule is best.  General Health: Any diseases which effect your body will effect your brain such as a pneumonia, urinary infection, blood clot, heart attack or stroke. Keep contact with your primary care doctor for regular follow ups.  Sleep. A good nights sleep is healthy for the brain. Seven hours is recommended. If you have insomnia or poor sleep habits we can give you some instructions. If you have sleep apnea wear your mask.  Diet: Eating a heart  healthy diet is also a good idea; fish and poultry instead of red meat, nuts (mostly non-peanuts), vegetables, fruits, olive oil or canola oil (instead of butter), minimal salt (use other spices to flavor foods), whole grain rice, bread, cereal and pasta and wine in moderation.Research is now showing that the MIND diet, which is a combination of The Mediterranean diet and the DASH diet, is beneficial for cognitive processing and longevity. Information about this diet can be found in The MIND Diet, a book by MDoyne Keel MS, RDN, and online at hNotebookDistributors.si Finances, Power of Attorney and Advance Directives: You should consider putting legal safeguards in place with regard to financial and medical decision making. While the spouse always has power of attorney for medical and financial issues in the absence of any form, you should consider what you want in case the spouse / caregiver is no longer around or capable of making decisions.        Orders Placed This Encounter  Procedures   TSH   Vitamin B12     Meds ordered this encounter  Medications   memantine (NAMENDA) 5 MG tablet    Sig: Take 1 tablet (5 mg total) by mouth 2 (two) times daily.    Dispense:  180 tablet    Refill:  2     Return in about 6 months (around 08/09/2021).    Alric Ran, MD 02/08/2021, 4:15 PM  [facility] [address] [place], [place] [address] [phone]

## 2021-02-08 NOTE — Patient Instructions (Addendum)
Start with Namenda 5 mg BID  Will check TSH and Vitamin B12 level today  Return in 6 months or sooner    There are well-accepted and sensible ways to reduce risk for Alzheimers disease and other degenerative brain disorders .  Exercise Daily Walk A daily 20 minute walk should be part of your routine. Disease related apathy can be a significant roadblock to exercise and the only way to overcome this is to make it a daily routine and perhaps have a reward at the end (something your loved one loves to eat or drink perhaps) or a personal trainer coming to the home can also be very useful. Most importantly, the patient is much more likely to exercise if the caregiver / spouse does it with him/her. In general a structured, repetitive schedule is best.  General Health: Any diseases which effect your body will effect your brain such as a pneumonia, urinary infection, blood clot, heart attack or stroke. Keep contact with your primary care doctor for regular follow ups.  Sleep. A good nights sleep is healthy for the brain. Seven hours is recommended. If you have insomnia or poor sleep habits we can give you some instructions. If you have sleep apnea wear your mask.  Diet: Eating a heart healthy diet is also a good idea; fish and poultry instead of red meat, nuts (mostly non-peanuts), vegetables, fruits, olive oil or canola oil (instead of butter), minimal salt (use other spices to flavor foods), whole grain rice, bread, cereal and pasta and wine in moderation.Research is now showing that the MIND diet, which is a combination of The Mediterranean diet and the DASH diet, is beneficial for cognitive processing and longevity. Information about this diet can be found in The MIND Diet, a book by Doyne Keel, MS, RDN, and online at [employer].si  Finances, Power of Attorney and Advance Directives: You should consider putting legal safeguards in place with regard to financial and  medical decision making. While the spouse always has power of attorney for medical and financial issues in the absence of any form, you should consider what you want in case the spouse / caregiver is no longer around or capable of making decisions.      Heart-head connection  New research shows there are things we can do to reduce the risk of mild cognitive impairment and dementia.  Several conditions known to increase the risk of cardiovascular disease -- such as high blood pressure, diabetes and high cholesterol -- also increase the risk of developing Alzheimer's. Some autopsy studies show that as many as 65 percent of individuals with Alzheimer's disease also have cardiovascular disease.  A longstanding question is why some people develop hallmark Alzheimer's plaques and tangles but do not develop the symptoms of Alzheimer's. Vascular disease may help researchers eventually find an answer. Some autopsy studies suggest that plaques and tangles may be present in the brain without causing symptoms of cognitive decline unless the brain also shows evidence of vascular disease. More research is needed to better understand the link between vascular health and Alzheimer's.  Physical exercise and diet Regular physical exercise may be a beneficial strategy to lower the risk of Alzheimer's and vascular dementia. Exercise may directly benefit brain cells by increasing blood and oxygen flow in the brain. Because of its known cardiovascular benefits, a medically approved exercise program is a valuable part of any overall wellness plan.  Current evidence suggests that heart-healthy eating may also help protect the brain. Heart-healthy eating includes limiting  the intake of sugar and saturated fats and making sure to eat plenty of fruits, vegetables, and whole grains. No one diet is best. Two diets that have been studied and may be beneficial are the DASH (Dietary Approaches to Stop Hypertension) diet and the  Mediterranean diet. The DASH diet emphasizes vegetables, fruits and fat-free or low-fat dairy products; includes whole grains, fish, poultry, beans, seeds, nuts and vegetable oils; and limits sodium, sweets, sugary beverages and red meats. A Mediterranean diet includes relatively little red meat and emphasizes whole grains, fruits and vegetables, fish and shellfish, and nuts, olive oil and other healthy fats.  Social connections and intellectual activity A number of studies indicate that maintaining strong social connections and keeping mentally active as we age might lower the risk of cognitive decline and Alzheimer's. Experts are not certain about the reason for this association. It may be due to direct mechanisms through which social and mental stimulation strengthen connections between nerve cells in the brain.  Head trauma There appears to be a strong link between future risk of Alzheimer's and serious head trauma, especially when injury involves loss of consciousness. You can help reduce your risk of Alzheimer's by protecting your head.  Wear a seat belt  Use a helmet when participating in sports  "Fall-proof" your home   What you can do now While research is not yet conclusive, certain lifestyle choices, such as physical activity and diet, may help support brain health and prevent Alzheimer's. Many of these lifestyle changes have been shown to lower the risk of other diseases, like heart disease and diabetes, which have been linked to Alzheimer's. With few drawbacks and plenty of known benefits, healthy lifestyle choices can improve your health and possibly protect your brain.  Learn more about brain health. You can help increase our knowledge by considering participation in a clinical study. Our free clinical trial matching services, TrialMatch, can help you find clinical trials in your area that are seeking volunteers.

## 2021-02-09 LAB — TSH: TSH: 3 u[IU]/mL (ref 0.450–4.500)

## 2021-02-09 LAB — VITAMIN B12: Vitamin B-12: 334 pg/mL (ref 232–1245)

## 2021-02-18 ENCOUNTER — Ambulatory Visit (HOSPITAL_COMMUNITY): Payer: Self-pay | Admitting: Psychiatry

## 2021-02-24 ENCOUNTER — Ambulatory Visit: Payer: Medicare Other

## 2021-03-08 ENCOUNTER — Ambulatory Visit (INDEPENDENT_AMBULATORY_CARE_PROVIDER_SITE_OTHER): Payer: Medicare Other | Admitting: Family Medicine

## 2021-03-08 ENCOUNTER — Other Ambulatory Visit: Payer: Self-pay

## 2021-03-08 VITALS — Temp 98.5°F

## 2021-03-08 DIAGNOSIS — Z23 Encounter for immunization: Secondary | ICD-10-CM | POA: Diagnosis not present

## 2021-03-08 NOTE — Progress Notes (Addendum)
Patient was here for Flu and Pneumonia 23 vaccines. Patient tolerated both injections on Left Deltoid without any complications. Patient is requesting if provider can send in a refill for Quetiapine. Per patient, he is due to run out of his rx and does not have an appointment with Dr. Primitivo Gauze until 03/30/21. Rx pended.

## 2021-03-08 NOTE — Addendum Note (Signed)
Addended by: Mertha Finders on: 03/08/2021 03:04 PM   Modules accepted: Orders

## 2021-03-08 NOTE — Addendum Note (Signed)
Addended by: Mertha Finders on: 03/08/2021 03:03 PM   Modules accepted: Orders

## 2021-03-09 ENCOUNTER — Other Ambulatory Visit: Payer: Self-pay

## 2021-03-09 MED ORDER — QUETIAPINE FUMARATE 50 MG PO TABS: 100.0000 mg | ORAL_TABLET | Freq: Every evening | ORAL | 0 refills | Status: DC

## 2021-03-15 ENCOUNTER — Ambulatory Visit (INDEPENDENT_AMBULATORY_CARE_PROVIDER_SITE_OTHER): Payer: Medicare Other | Admitting: Family Medicine

## 2021-03-15 DIAGNOSIS — Z0000 Encounter for general adult medical examination without abnormal findings: Secondary | ICD-10-CM

## 2021-03-15 NOTE — Progress Notes (Signed)
MEDICARE ANNUAL WELLNESS VISIT  03/15/2021  Telephone Visit Disclaimer This Medicare AWV was conducted by telephone due to national recommendations for restrictions regarding the COVID-19 Pandemic (e.g. social distancing).  I verified, using two identifiers, that I am speaking with Mark Thomas or their authorized healthcare agent. I discussed the limitations, risks, security, and privacy concerns of performing an evaluation and management service by telephone and the potential availability of an in-person appointment in the future. The patient expressed understanding and agreed to proceed.  Location of Patient: Home with wife, Anne Ng.  Location of Provider (nurse):  In the office.  Subjective:    Mark Thomas is a 70 y.o. male patient of Luetta Nutting, DO who had a Medicare Annual Wellness Visit today via telephone. Bodin is Retired and lives with their spouse. he has 2 children. he reports that he is socially active and does interact with friends/family regularly. he is minimally physically active and enjoys watching football and spending time with his grandchildren.  Patient Care Team: Luetta Nutting, DO as PCP - General (Family Medicine)  Advanced Directives 03/15/2021 01/08/2021 01/07/2021 12/27/2020 12/24/2020 12/06/2020 11/09/2020  Does Patient Have a Medical Advance Directive? Yes Yes Yes No Yes No;Yes Yes  Type of Paramedic of [place];Living will [place];Living will [place];Living will - Living will;Healthcare Power of Attorney Living will [place];Living will  Does patient want to make changes to medical advance directive? No - Patient declined No - Patient declined - No - Patient declined No - Patient declined - -  Copy of [place] in Chart? Yes - validated most recent copy scanned in chart (See row information) No - copy requested - - No - copy requested - [facility]  Utilization Over the Past 12 Months: # of hospitalizations or ER visits:  20 # of surgeries: 0  Review of Systems    Patient reports that his overall health is worse compared to last year.  History obtained from chart review, the patient, and his wife. Patient has vascular dementia.  Patient Reported Readings (BP, Pulse, CBG, Weight, etc) none  Pain Assessment Pain : 0-10 Pain Score: 4  Pain Type: Chronic pain Pain Location: Shoulder Pain Orientation: Right Pain Descriptors / Indicators: Throbbing Pain Onset: More than a month ago Pain Frequency: Constant Pain Relieving Factors: none.  Pain Relieving Factors: none.  Current Medications & Allergies (verified) Allergies as of 03/15/2021       Reactions   Dilaudid [hydromorphone] Other (See Comments)   Respiratory Arrest   Codeine    Penicillins    Phenobarbital         Medication List        Accurate as of March 15, 2021 11:54 AM. If you have any questions, ask your nurse or doctor.          acetaminophen 500 MG tablet Commonly known as: TYLENOL Take 1,000 mg by mouth every 6 (six) hours as needed for mild pain.   albuterol 108 (90 Base) MCG/ACT inhaler Commonly known as: VENTOLIN HFA Inhale 2 puffs into the lungs every 4 (four) hours as needed for wheezing or shortness of breath.   allopurinol 100 MG tablet Commonly known as: ZYLOPRIM Take 100 mg by mouth daily.   aspirin 81 MG EC tablet Take 1 tablet by mouth daily.   atorvastatin 80 MG tablet Commonly known as: LIPITOR Take 80 mg by mouth daily.  clopidogrel 75 MG tablet Commonly known as: PLAVIX Take 75 mg by mouth daily.   escitalopram 20 MG tablet Commonly known as: LEXAPRO Take 1 tablet (20 mg total) by mouth daily.   ferrous sulfate 325 (65 FE) MG EC tablet Take 325 mg by mouth in the morning and at bedtime.   latanoprost 0.005 % ophthalmic solution Commonly known as: XALATAN Place 1 drop into both eyes at bedtime.   losartan  25 MG tablet Commonly known as: COZAAR Take 25 mg by mouth daily as needed (BP >160).   magnesium oxide 400 MG tablet Commonly known as: MAG-OX Take 400 mg by mouth daily.   memantine 5 MG tablet Commonly known as: Namenda Take 1 tablet (5 mg total) by mouth 2 (two) times daily.   metoprolol tartrate 25 MG tablet Commonly known as: LOPRESSOR Take 12.5 mg by mouth 2 (two) times daily.   nitroGLYCERIN 0.4 MG/SPRAY spray Commonly known as: NITROLINGUAL Place 1 spray under the tongue every 5 (five) minutes x 3 doses as needed for chest pain.   pantoprazole 40 MG tablet Commonly known as: PROTONIX Take 40 mg by mouth every evening.   potassium citrate 10 MEQ (1080 MG) SR tablet Commonly known as: UROCIT-K Take 10 mEq by mouth daily.   primidone 50 MG tablet Commonly known as: MYSOLINE Take 100 mg by mouth 2 (two) times daily.   QUEtiapine 50 MG tablet Commonly known as: SEROQUEL Take 2 tablets (100 mg total) by mouth at bedtime.   ranolazine 1000 MG SR tablet Commonly known as: RANEXA Take 1,000 mg by mouth 2 (two) times daily.   rOPINIRole 3 MG tablet Commonly known as: REQUIP Take 3 mg by mouth at bedtime.   triamcinolone ointment 0.1 % Commonly known as: KENALOG Apply 1 application topically 2 (two) times daily.   Vitamin D-3 125 MCG (5000 UT) Tabs Take 1 tablet by mouth daily.        History (reviewed): Past Medical History:  Diagnosis Date   Anxiety    Coronary artery disease    Depression    Essential tremor    GERD (gastroesophageal reflux disease)    High cholesterol    Hypertension    Myocardial infarct (HCC)    Orthostatic hypotension    Stroke ([place])    Vascular dementia ([place])    Past Surgical History:  Procedure Laterality Date   APPENDECTOMY     CARDIAC SURGERY     CHOLECYSTECTOMY     HERNIA REPAIR     NASAL SINUS SURGERY     SHOULDER SURGERY     Family History  Problem Relation Age of Onset   Hypertension Mother    Stroke  Mother    Stroke Father    Hypertension Father    Social History   Socioeconomic History   Marital status: Married    Spouse name: Bradin Mcadory   Number of children: 2   Years of education: 20   Highest education level: Master's degree (e.g., MA, MS, MEng, MEd, MSW, MBA)  Occupational History   Occupation: Retired  Tobacco Use   Smoking status: Never    Passive exposure: Never   Smokeless tobacco: Never  Vaping Use   Vaping Use: Never used  Substance and Sexual Activity   Alcohol use: Not Currently   Drug use: Never   Sexual activity: Not on file  Other Topics Concern   Not on file  Social History Narrative   Lives with his wife. He has vascular  dementia. His wife is his primary caretaker.He enjoys watching football and spending time with his grandchildren.   Social Determinants of Health   Financial Resource Strain: Low Risk    Difficulty of Paying Living Expenses: Not hard at all  Food Insecurity: No Food Insecurity   Worried About Charity fundraiser in the Last Year: Never true   [place] in the Last Year: Never true  Transportation Needs: No Transportation Needs   Lack of Transportation (Medical): No   Lack of Transportation (Non-Medical): No  Physical Activity: Insufficiently Active   Days of Exercise per Week: 2 days   Minutes of Exercise per Session: 50 min  Stress: No Stress Concern Present   Feeling of Stress : Not at all  Social Connections: Socially Integrated   Frequency of Communication with Friends and Family: More than three times a week   Frequency of Social Gatherings with Friends and Family: Once a week   Attends Religious Services: More than 4 times per year   Active Member of [employer] or Organizations: Yes   Attends Archivist Meetings: More than 4 times per year   Marital Status: Married    Activities of Daily Living In your present state of health, do you have any difficulty performing the following activities: 03/15/2021  01/08/2021  Hearing? N N  Vision? N N  Difficulty concentrating or making decisions? Y N  Comment has been diagnosed with vascular dementia -  Walking or climbing stairs? Y Y  Comment yes, due to his strokes. -  Dressing or bathing? Y N  Comment Yes, due to shoulder pain. He is going to have surgery early next year. -  Doing errands, shopping? Y N  Comment He is not able to do that since he is not driving currently. -  Preparing Food and eating ? Y -  Comment His wife prepares food and cuts up the food for him due to hx of strokes. -  Using the Toilet? Y -  Comment He needs assistance with standing up at the toilet; he uses a walker to ambulate. -  In the past six months, have you accidently leaked urine? N -  Do you have problems with loss of bowel control? N -  Managing your Medications? Y -  Comment his wife manages that. -  Managing your Finances? Y -  Comment his wife manages that -  Housekeeping or managing your Housekeeping? Y -  Comment his wife manages that -    Patient Education/ Literacy How often do you need to have someone help you when you read instructions, pamphlets, or other written materials from your doctor or pharmacy?: 1 - Never What is the last grade level you completed in school?: 2 Master's degrees  Exercise Current Exercise Habits: Structured exercise class, Type of exercise: strength training/weights, Time (Minutes): 45, Frequency (Times/Week): 2, Weekly Exercise (Minutes/Week): 90, Intensity: Moderate, Exercise limited by: neurologic condition(s);orthopedic condition(s)  Diet Patient reports consuming 2 meals a day and 2-3 snack(s) a day Patient reports that his primary diet is: Regular Patient reports that she does have regular access to food.   Depression Screen PHQ 2/9 Scores 03/15/2021 01/26/2021 12/24/2020  PHQ - 2 Score _0 PHQ- 9 Score _1 Fall Risk Fall Risk  03/15/2021 01/26/2021 12/24/2020  Falls in the past year? _2 Number  falls in past yr: 1 0 1  Injury with Fall? 1 1  1  Risk for fall due to : History of fall(s);Impaired balance/gait;Impaired mobility History of fall(s);Impaired balance/gait;Impaired mobility No Fall Risks;Impaired balance/gait;Impaired mobility  Follow up Falls evaluation completed;Education provided;Falls prevention discussed Falls evaluation completed Falls evaluation completed     Objective:  Mark Thomas seemed alert and oriented and he participated appropriately during our telephone visit.  Blood Pressure Weight BMI  BP Readings from Last 3 Encounters:  02/08/21 111/70  02/03/21 92/60  01/26/21 110/68   Wt Readings from Last 3 Encounters:  02/08/21 253 lb (114.8 kg)  02/03/21 253 lb (114.8 kg)  01/26/21 248 lb (112.5 kg)   BMI Readings from Last 1 Encounters:  02/08/21 33.38 kg/m    *Unable to obtain current vital signs, weight, and BMI due to telephone visit type  Hearing/Vision  Coralyn Mark did not seem to have difficulty with hearing/understanding during the telephone conversation Reports that he has had a formal eye exam by an eye care professional within the past year Reports that he has not had a formal hearing evaluation within the past year *Unable to fully assess hearing and vision during telephone visit type  Cognitive Function: 6CIT Screen 03/15/2021  What Year? 0 points  What month? 0 points  What time? 0 points  Count back from 20 0 points  Months in reverse 0 points  Repeat phrase 0 points  Total Score 0   (Normal:0-7, Significant for Dysfunction: >8)  Normal Cognitive Function Screening: Yes   Immunization & Health Maintenance Record Immunization History  Administered Date(s) Administered   Fluad Quad(high Dose 65+) 03/08/2021   Influenza Nasal 03/15/2013   Influenza, Seasonal, Injecte, Preservative Fre 01/08/2010, 02/21/2014   Influenza,inj,Quad PF,6+ Mos 04/08/2016, 01/30/2019   Influenza-Unspecified 04/18/1995, 01/31/1997, 03/02/2005,  02/16/2006, 03/21/2007, 03/19/2009, 12/31/2009, 05/03/2011, 01/31/2012, 05/17/2012, 02/19/2013, 03/15/2013, 02/01/2016   Moderna Sars-Covid-2 Vaccination 06/14/2019, 07/12/2019, 04/16/2020   Pneumococcal Conjugate-13 01/21/2014, 03/20/2019   Pneumococcal Polysaccharide-23 03/16/2011, 06/03/2011, 03/08/2021   Tdap 09/20/2014   Zoster, Live 01/21/2014    Health Maintenance  Topic Date Due   COVID-19 Vaccine (4 - Booster for Moderna series) 03/31/2021 (Originally 06/11/2020)   Zoster Vaccines- Shingrix (1 of 2) 06/15/2021 (Originally 12/22/1969)   Hepatitis C Screening  03/15/2022 (Originally 12/22/1968)   TETANUS/TDAP  09/19/2024   COLONOSCOPY (Pts 45-57yr Insurance coverage will need to be confirmed)  08/09/2025   Pneumonia Vaccine 70 Years old  Completed   INFLUENZA VACCINE  Completed   HPV VACCINES  Aged Out       Assessment  This is a routine wellness examination for Mark Thomas  Health Maintenance: Due or Overdue There are no preventive care reminders to display for this patient.   TDuke Salviadoes not need a referral for Community Assistance: Care Management:   no Social Work:    no Prescription Assistance:  no Nutrition/Diabetes Education:  no   Plan:  Personalized Goals  Goals Addressed               This Visit's Progress     Patient Stated (pt-stated)        03/15/2021 AWV Goal: Fall Prevention  Over the next year, patient will decrease their risk for falls by: Using assistive devices, such as a cane or walker, as needed Identifying fall risks within their home and correcting them by: Removing throw rugs Adding handrails to stairs or ramps Removing clutter and keeping a clear pathway throughout the home Increasing light, especially at night Adding shower handles/bars Raising toilet seat Identifying potential personal  risk factors for falls: Medication side effects Incontinence/urgency Vestibular dysfunction Hearing loss Musculoskeletal  disorders Neurological disorders Orthostatic hypotension         Personalized Health Maintenance & Screening Recommendations  Shingrix vaccine    Lung Cancer Screening Recommended: no (Low Dose CT Chest recommended if Age 41-80 years, 30 pack-year currently smoking OR have quit w/in past 15 years) Hepatitis C Screening recommended: no HIV Screening recommended: no  Advanced Directives: Written information was not prepared per patient's request.  Referrals & Orders No orders of the defined types were placed in this encounter.   Follow-up Plan Follow-up with Luetta Nutting, DO as planned Schedule your Shingrix vaccine at the pharmacy.  Medicare wellness visit in one year. Patient will access AVS on mychart.   I have personally reviewed and noted the following in the patient's chart:   Medical and social history Use of alcohol, tobacco or illicit drugs  Current medications and supplements Functional ability and status Nutritional status Physical activity Advanced directives List of other physicians Hospitalizations, surgeries, and ER visits in previous 12 months Vitals Screenings to include cognitive, depression, and falls Referrals and appointments  In addition, I have reviewed and discussed with Mark Thomas certain preventive protocols, quality metrics, and best practice recommendations. A written personalized care plan for preventive services as well as general preventive health recommendations is available and can be mailed to the patient at his request.      Tinnie Gens, RN  03/15/2021

## 2021-03-15 NOTE — Patient Instructions (Addendum)
[facility] Maintenance Summary and Written Plan of Care  [name] ,  Thank you for allowing me to perform your Medicare Annual Wellness Visit and for your ongoing commitment to your health.   Health Maintenance & Immunization History Health Maintenance  Topic Date Due   COVID-19 Vaccine (4 - Booster for Moderna series) 03/31/2021 (Originally 06/11/2020)   Zoster Vaccines- Shingrix (1 of 2) 06/15/2021 (Originally 12/22/1969)   Hepatitis C Screening  03/15/2022 (Originally [birth year])   TETANUS/TDAP  09/19/2024   COLONOSCOPY (Pts 45-35yr Insurance coverage will need to be confirmed)  08/09/2025   Pneumonia Vaccine 68 Years old  Completed   INFLUENZA VACCINE  Completed   HPV VACCINES  Aged Out   Immunization History  Administered Date(s) Administered   Fluad Quad(high Dose 68+) 03/08/2021   Influenza Nasal 03/15/2013   Influenza, Seasonal, Injecte, Preservative Fre 01/08/2010, 02/21/2014   Influenza,inj,Quad PF,6+ Mos 04/08/2016, 01/30/2019   Influenza-Unspecified 04/18/1995, 01/31/1997, 03/02/2005, 02/16/2006, 03/21/2007, 03/19/2009, 12/31/2009, 05/03/2011, 01/31/2012, 05/17/2012, 02/19/2013, 03/15/2013, 02/01/2016   Moderna Sars-Covid-2 Vaccination 06/14/2019, 07/12/2019, 04/16/2020   Pneumococcal Conjugate-13 01/21/2014, 03/20/2019   Pneumococcal Polysaccharide-23 03/16/2011, 06/03/2011, 03/08/2021   Tdap 09/20/2014   Zoster, Live 01/21/2014    These are the patient goals that we discussed:  Goals Addressed               This Visit's Progress     Patient Stated (pt-stated)        03/15/2021 AWV Goal: Fall Prevention  Over the next year, patient will decrease their risk for falls by: Using assistive devices, such as a cane or walker, as needed Identifying fall risks within their home and correcting them by: Removing throw rugs Adding handrails to stairs or ramps Removing clutter and keeping a clear pathway throughout the  home Increasing light, especially at night Adding shower handles/bars Raising toilet seat Identifying potential personal risk factors for falls: Medication side effects Incontinence/urgency Vestibular dysfunction Hearing loss Musculoskeletal disorders Neurological disorders Orthostatic hypotension           This is a list of Health Maintenance Items that are overdue or due now: Shingrix vaccine   Orders/Referrals Placed Today: No orders of the defined types were placed in this encounter.  (Contact our referral department at [phone] you have not spoken with someone about your referral appointment within the next 5 days)    Follow-up Plan Follow-up with MLuetta Nutting DO as planned Schedule your Shingrix vaccine at the pharmacy.  Medicare wellness visit in one year. Patient will access AVS on mychart.      Health Maintenance, Male Adopting a healthy lifestyle and getting preventive care are important in promoting health and wellness. Ask your health care provider about: The right schedule for you to have regular tests and exams. Things you can do on your own to prevent diseases and keep yourself healthy. What should I know about diet, weight, and exercise? Eat a healthy diet  Eat a diet that includes plenty of vegetables, fruits, low-fat dairy products, and lean protein. Do not eat a lot of foods that are high in solid fats, added sugars, or sodium. Maintain a healthy weight Body mass index (BMI) is a measurement that can be used to identify possible weight problems. It estimates body fat based on height and weight. Your health care provider can help determine your BMI and help you achieve or maintain a healthy weight. Get regular exercise Get regular exercise. This is one of the most important things  you can do for your health. Most adults should: Exercise for at least 150 minutes each week. The exercise should increase your heart rate and make you sweat  (moderate-intensity exercise). Do strengthening exercises at least twice a week. This is in addition to the moderate-intensity exercise. Spend less time sitting. Even light physical activity can be beneficial. Watch cholesterol and blood lipids Have your blood tested for lipids and cholesterol at 68 years of age, then have this test every 5 years. You may need to have your cholesterol levels checked more often if: Your lipid or cholesterol levels are high. You are older than 68 years of age. You are at high risk for heart disease. What should I know about cancer screening? Many types of cancers can be detected early and may often be prevented. Depending on your health history and family history, you may need to have cancer screening at various ages. This may include screening for: Colorectal cancer. Prostate cancer. Skin cancer. Lung cancer. What should I know about heart disease, diabetes, and high blood pressure? Blood pressure and heart disease High blood pressure causes heart disease and increases the risk of stroke. This is more likely to develop in people who have high blood pressure readings or are overweight. Talk with your health care provider about your target blood pressure readings. Have your blood pressure checked: Every 3-5 years if you are 68-64 years of age. Every year if you are 68 years old or older. If you are between the ages of 47 and 38 and are a current or former smoker, ask your health care provider if you should have a one-time screening for abdominal aortic aneurysm (AAA). Diabetes Have regular diabetes screenings. This checks your fasting blood sugar level. Have the screening done: Once every three years after age 68 if you are at a normal weight and have a low risk for diabetes. More often and at a younger age if you are overweight or have a high risk for diabetes. What should I know about preventing infection? Hepatitis B If you have a higher risk for  hepatitis B, you should be screened for this virus. Talk with your health care provider to find out if you are at risk for hepatitis B infection. Hepatitis C Blood testing is recommended for: Everyone born from [birth year] through 1963. Anyone with known risk factors for hepatitis C. Sexually transmitted infections (STIs) You should be screened each year for STIs, including gonorrhea and chlamydia, if: You are sexually active and are younger than 68 years of age. You are older than 68 years of age and your health care provider tells you that you are at risk for this type of infection. Your sexual activity has changed since you were last screened, and you are at increased risk for chlamydia or gonorrhea. Ask your health care provider if you are at risk. Ask your health care provider about whether you are at high risk for HIV. Your health care provider may recommend a prescription medicine to help prevent HIV infection. If you choose to take medicine to prevent HIV, you should first get tested for HIV. You should then be tested every 3 months for as long as you are taking the medicine. Follow these instructions at home: Alcohol use Do not drink alcohol if your health care provider tells you not to drink. If you drink alcohol: Limit how much you have to 0-2 drinks a day. Know how much alcohol is in your drink. In the [place] one drink equals  one 12 oz bottle of beer (355 mL), one 5 oz glass of wine (148 mL), or one 1 oz glass of hard liquor (44 mL). Lifestyle Do not use any products that contain nicotine or tobacco. These products include cigarettes, chewing tobacco, and vaping devices, such as e-cigarettes. If you need help quitting, ask your health care provider. Do not use street drugs. Do not share needles. Ask your health care provider for help if you need support or information about quitting drugs. General instructions Schedule regular health, dental, and eye exams. Stay current with your  vaccines. Tell your health care provider if: You often feel depressed. You have ever been abused or do not feel safe at home. Summary Adopting a healthy lifestyle and getting preventive care are important in promoting health and wellness. Follow your health care provider's instructions about healthy diet, exercising, and getting tested or screened for diseases. Follow your health care provider's instructions on monitoring your cholesterol and blood pressure. This information is not intended to replace advice given to you by your health care provider. Make sure you discuss any questions you have with your health care provider. Document Revised: 09/07/2020 Document Reviewed: 09/07/2020 Elsevier Patient Education  [employer].

## 2021-03-16 ENCOUNTER — Telehealth: Payer: Self-pay | Admitting: Cardiology

## 2021-03-16 DIAGNOSIS — R0609 Other forms of dyspnea: Secondary | ICD-10-CM

## 2021-03-16 NOTE — Telephone Encounter (Signed)
Spoke to patient wife per dpr. She reports patient has been short of breath for the past 1 week and 1/2 on exertion. No swelling in lower extremities, no weight gain. Patient does have chronic chest pain but he has not been complaining of this lately. Patient has not been using nitro either. Patient has been on lasix in the past he is not now. Just getting up to go to the restroom causes patient to become short of breath. Patient wife would like Dr. Wendy Poet recommendation on what they should do.

## 2021-03-16 NOTE — Telephone Encounter (Signed)
Pt c/o Shortness Of Breath: STAT if SOB developed within the last 24 hours or pt is noticeably SOB on the phone  1. Are you currently SOB (can you hear that pt is SOB on the phone)? yes  2. How long have you been experiencing SOB? Week and half  3. Are you SOB when sitting or when up moving around? Moving around  4. Are you currently experiencing any other symptoms? no

## 2021-03-17 NOTE — Telephone Encounter (Signed)
Patient's wife is following up.

## 2021-03-17 NOTE — Telephone Encounter (Signed)
Patient wife informed we need patient to have blood work she will get him in to have it. No further questions.

## 2021-03-19 ENCOUNTER — Other Ambulatory Visit: Payer: Self-pay | Admitting: Family Medicine

## 2021-03-19 LAB — BASIC METABOLIC PANEL
BUN/Creatinine Ratio: 13 (ref 10–24)
BUN: 15 mg/dL (ref 8–27)
CO2: 23 mmol/L (ref 20–29)
Calcium: 9.3 mg/dL (ref 8.6–10.2)
Chloride: 103 mmol/L (ref 96–106)
Creatinine, Ser: 1.18 mg/dL (ref 0.76–1.27)
Glucose: 124 mg/dL — ABNORMAL HIGH (ref 70–99)
Potassium: 4.4 mmol/L (ref 3.5–5.2)
Sodium: 140 mmol/L (ref 134–144)
eGFR: 67 mL/min/{1.73_m2} (ref >59)

## 2021-03-19 LAB — PRO B NATRIURETIC PEPTIDE: NT-Pro BNP: 370 pg/mL (ref 0–376)

## 2021-03-19 MED ORDER — FERROUS SULFATE 325 (65 FE) MG PO TBEC: 325.0000 mg | DELAYED_RELEASE_TABLET | Freq: Two times a day (BID) | ORAL | 1 refills | Status: AC

## 2021-03-22 ENCOUNTER — Telehealth: Payer: Self-pay

## 2021-03-22 DIAGNOSIS — I10 Essential (primary) hypertension: Secondary | ICD-10-CM

## 2021-03-22 MED ORDER — FUROSEMIDE 20 MG PO TABS: 20.0000 mg | ORAL_TABLET | Freq: Every day | ORAL | 1 refills | Status: DC

## 2021-03-22 NOTE — Telephone Encounter (Signed)
-----  Message from Park Liter, MD sent at 03/22/2021  9:57 AM EST ----- Lets add Lasix 20 mg daily, Chem-7 1 week

## 2021-03-22 NOTE — Telephone Encounter (Signed)
Spoke with patient wife Mylinda Latina (ok per [facility]) aware of results and recommendations and agreed with plan. Med sent and lab order on file.

## 2021-03-29 ENCOUNTER — Other Ambulatory Visit: Payer: Self-pay | Admitting: Emergency Medicine

## 2021-03-29 DIAGNOSIS — I10 Essential (primary) hypertension: Secondary | ICD-10-CM

## 2021-03-30 ENCOUNTER — Ambulatory Visit (INDEPENDENT_AMBULATORY_CARE_PROVIDER_SITE_OTHER): Payer: Medicare Other | Admitting: Psychiatry

## 2021-03-30 ENCOUNTER — Ambulatory Visit (HOSPITAL_COMMUNITY): Payer: Medicare Other | Admitting: Psychiatry

## 2021-03-30 ENCOUNTER — Encounter (HOSPITAL_COMMUNITY): Payer: Self-pay | Admitting: Psychiatry

## 2021-03-30 VITALS — BP 80/70 | Temp 98.6°F | Ht 73.0 in | Wt 256.0 lb

## 2021-03-30 DIAGNOSIS — B359 Dermatophytosis, unspecified: Secondary | ICD-10-CM | POA: Insufficient documentation

## 2021-03-30 DIAGNOSIS — Z658 Other specified problems related to psychosocial circumstances: Secondary | ICD-10-CM | POA: Insufficient documentation

## 2021-03-30 DIAGNOSIS — F4323 Adjustment disorder with mixed anxiety and depressed mood: Secondary | ICD-10-CM

## 2021-03-30 DIAGNOSIS — F331 Major depressive disorder, recurrent, moderate: Secondary | ICD-10-CM

## 2021-03-30 DIAGNOSIS — L853 Xerosis cutis: Secondary | ICD-10-CM | POA: Insufficient documentation

## 2021-03-30 DIAGNOSIS — I2510 Atherosclerotic heart disease of native coronary artery without angina pectoris: Secondary | ICD-10-CM | POA: Diagnosis not present

## 2021-03-30 DIAGNOSIS — Z719 Counseling, unspecified: Secondary | ICD-10-CM | POA: Insufficient documentation

## 2021-03-30 LAB — BASIC METABOLIC PANEL
BUN/Creatinine Ratio: 16 (ref 10–24)
BUN: 23 mg/dL (ref 8–27)
CO2: 21 mmol/L (ref 20–29)
Calcium: 9.3 mg/dL (ref 8.6–10.2)
Chloride: 98 mmol/L (ref 96–106)
Creatinine, Ser: 1.41 mg/dL — ABNORMAL HIGH (ref 0.76–1.27)
Glucose: 116 mg/dL — ABNORMAL HIGH (ref 70–99)
Potassium: 4.2 mmol/L (ref 3.5–5.2)
Sodium: 137 mmol/L (ref 134–144)
eGFR: 54 mL/min/{1.73_m2} — ABNORMAL LOW (ref >59)

## 2021-03-30 MED ORDER — ESCITALOPRAM OXALATE 20 MG PO TABS: 30.0000 mg | ORAL_TABLET | Freq: Every day | ORAL | 1 refills | Status: DC

## 2021-03-30 MED ORDER — QUETIAPINE FUMARATE 50 MG PO TABS: 100.0000 mg | ORAL_TABLET | Freq: Every evening | ORAL | 1 refills | Status: DC

## 2021-03-30 NOTE — Progress Notes (Signed)
Psychiatric Initial Adult Assessment   Patient Identification: DEMETRIC DUNNAWAY MRN:  [id number] Date of Evaluation:  03/30/2021 Referral Source: pcp Chief Complaint:  establish care, depression Visit Diagnosis:    ICD-10-CM   1. Moderate episode of recurrent major depressive disorder (HCC)  F33.1     2. Adjustment disorder with mixed anxiety and depressed mood  F43.23       History of Present Illness: Patient is a 70 years old currently married Caucasian male referred by primary care physician establish care for depression he has multiple medical comorbidities including history of stroke, coronary artery disease, vascular dementia  Patient states he has been treated for depression for the last more than 5 to 7 years and has stopped alcohol around 3 years ago they have moved from [place] and has been on Lexapro and Seroquel.  Wife is here with him for the appointment patient feels medication does help some but still endorses feeling down tired decreased energy irregular sleep.  In the morning he does not feel like he wants to do things but feels limited because of his medical comorbidities including history of stroke, coronary artery disease and adjustment to the above.  He is going through comparable other stressors including mother-in-law terminal in ICU.  Does not endorse excessive worries but he does endorse a reasonable worries more concerned about feeling down a motivated and irregular sleep.  He has been on Seroquel 100 mg.  In the past he has been taking trazodone and hydroxyzine but he wants to take more and he was having falls or dizziness.. Does not endorse psychotic symptoms does not endorse delusions  He does have a supportive wife  He has remained off alcohol for more than 3 years  He tries to keep himself busy but according to his wife he is mostly in bed taking naps or lying down watching television and sleeping all through the day and then he becomes more alert during the  nighttime and not wanting to sleep or wake   Aggravating factors; medical comorbidities including stroke.  Family stressors  Modifying factors; his wife, retirement  Duration more than 10 years Hospital admission multiple last was in 2008 for depression along with alcohol dependence    Past Psychiatric History: depression, alcohol dependence  Previous Psychotropic Medications: No   Substance Abuse History in the last 12 months:  No.  Consequences of Substance Abuse: NA  Past Medical History:  Past Medical History:  Diagnosis Date   Anxiety    Coronary artery disease    Depression    Essential tremor    GERD (gastroesophageal reflux disease)    High cholesterol    Hypertension    Myocardial infarct (HCC)    Orthostatic hypotension    Stroke ([place])    Vascular dementia ([place])     Past Surgical History:  Procedure Laterality Date   APPENDECTOMY     CARDIAC SURGERY     CHOLECYSTECTOMY     HERNIA REPAIR     NASAL SINUS SURGERY     SHOULDER SURGERY      Family Psychiatric History: mother : anxiety  Family History:  Family History  Problem Relation Age of Onset   Hypertension Mother    Stroke Mother    Stroke Father    Hypertension Father     Social History:   Social History   Socioeconomic History   Marital status: Married    Spouse name: Zackerie Sara   Number of children: 2   Years  of education: 20   Highest education level: Master's degree (e.g., MA, MS, MEng, MEd, MSW, MBA)  Occupational History   Occupation: Retired  Tobacco Use   Smoking status: Never    Passive exposure: Never   Smokeless tobacco: Never  Vaping Use   Vaping Use: Never used  Substance and Sexual Activity   Alcohol use: Not Currently   Drug use: Never   Sexual activity: Not on file  Other Topics Concern   Not on file  Social History Narrative   Lives with his wife. He has vascular dementia. His wife is his primary caretaker.He enjoys watching football and spending time  with his grandchildren.   Social Determinants of Health   Financial Resource Strain: Low Risk    Difficulty of Paying Living Expenses: Not hard at all  Food Insecurity: No Food Insecurity   Worried About Charity fundraiser in the Last Year: Never true   [place] in the Last Year: Never true  Transportation Needs: No Transportation Needs   Lack of Transportation (Medical): No   Lack of Transportation (Non-Medical): No  Physical Activity: Insufficiently Active   Days of Exercise per Week: 2 days   Minutes of Exercise per Session: 50 min  Stress: No Stress Concern Present   Feeling of Stress : Not at all  Social Connections: Socially Integrated   Frequency of Communication with Friends and Family: More than three times a week   Frequency of Social Gatherings with Friends and Family: Once a week   Attends Religious Services: More than 4 times per year   Active Member of [employer] or Organizations: Yes   Attends Music therapist: More than 4 times per year   Marital Status: Married    Additional Social History: grew up with parens, mom had nerve problems or anxiety Currently married, retired Marine scientist, 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, supportive wife Allergies:   Allergies  Allergen Reactions   Dilaudid [Hydromorphone] Other (See Comments)    Respiratory Arrest   Codeine    Penicillins    Phenobarbital     Metabolic Disorder Labs: Lab Results  Component Value Date   HGBA1C 6.0 (H) 01/08/2021   MPG 125.5 01/08/2021   No results found for: PROLACTIN Lab Results  Component Value Date   CHOL 131 01/07/2021   TRIG 152 (H) 01/07/2021   HDL 47 01/07/2021   CHOLHDL 2.8 01/07/2021   VLDL 30 01/07/2021   LDLCALC 54 01/07/2021   Lab Results  Component Value Date   TSH 3.000 02/08/2021    Therapeutic Level Labs: No results found for: LITHIUM No results found for: CBMZ No results found for: VALPROATE  Current Medications: Current Outpatient Medications  Medication Sig  Dispense Refill   acetaminophen (TYLENOL) 500 MG tablet Take 1,000 mg by mouth every 6 (six) hours as needed for mild pain.     albuterol (VENTOLIN HFA) 108 (90 Base) MCG/ACT inhaler Inhale 2 puffs into the lungs every 4 (four) hours as needed for wheezing or shortness of breath.     allopurinol (ZYLOPRIM) 100 MG tablet Take 100 mg by mouth daily.     aspirin 81 MG EC tablet Take 1 tablet by mouth daily.     atorvastatin (LIPITOR) 80 MG tablet Take 80 mg by mouth daily.     Cholecalciferol (VITAMIN D-3) 125 MCG (5000 UT) TABS Take 1 tablet by mouth daily.     clopidogrel (PLAVIX) 75 MG tablet Take 75 mg by mouth daily.  escitalopram (LEXAPRO) 20 MG tablet Take 1.5 tablets (30 mg total) by mouth daily. 45 tablet 1   ferrous sulfate 325 (65 FE) MG EC tablet Take 1 tablet (325 mg total) by mouth in the morning and at bedtime. 180 tablet 1   furosemide (LASIX) 20 MG tablet Take 1 tablet (20 mg total) by mouth daily. 30 tablet 1   latanoprost (XALATAN) 0.005 % ophthalmic solution Place 1 drop into both eyes at bedtime.     losartan (COZAAR) 25 MG tablet Take 25 mg by mouth daily as needed (BP >160).     magnesium oxide (MAG-OX) 400 MG tablet Take 400 mg by mouth daily.     memantine (NAMENDA) 5 MG tablet Take 1 tablet (5 mg total) by mouth 2 (two) times daily. 180 tablet 2   metoprolol tartrate (LOPRESSOR) 25 MG tablet Take 12.5 mg by mouth 2 (two) times daily.     nitroGLYCERIN (NITROLINGUAL) 0.4 MG/SPRAY spray Place 1 spray under the tongue every 5 (five) minutes x 3 doses as needed for chest pain.     pantoprazole (PROTONIX) 40 MG tablet Take 40 mg by mouth every evening.     potassium citrate (UROCIT-K) 10 MEQ (1080 MG) SR tablet Take 10 mEq by mouth daily.     primidone (MYSOLINE) 50 MG tablet Take 100 mg by mouth 2 (two) times daily.     QUEtiapine (SEROQUEL) 50 MG tablet Take 2 tablets (100 mg total) by mouth at bedtime. 60 tablet 1   ranolazine (RANEXA) 1000 MG SR tablet Take 1,000 mg by  mouth 2 (two) times daily.     rOPINIRole (REQUIP) 3 MG tablet Take 3 mg by mouth at bedtime.     triamcinolone ointment (KENALOG) 0.1 % Apply 1 application topically 2 (two) times daily.     No current facility-administered medications for this visit.    Psychiatric Specialty Exam: Review of Systems  Cardiovascular:  Negative for chest pain.  Psychiatric/Behavioral:  Negative for agitation, behavioral problems and suicidal ideas.    Blood pressure (!) 80/70, temperature 98.6 F (37 C), height _0  (1.854 m), weight 256 lb (116.1 kg).Body mass index is 33.78 kg/m.  General Appearance: Casual  Eye Contact:  Fair  Speech:  Normal Rate  Volume:  Decreased  Mood:  Depressed  Affect:  Constricted  Thought Process:  Goal Directed  Orientation:  Full (Time, Place, and Person)  Thought Content:  Rumination  Suicidal Thoughts:  No  Homicidal Thoughts:  No  Memory:  Immediate;   Fair  Judgement:  Fair  Insight:  Fair  Psychomotor Activity:  Decreased  Concentration:  Concentration: Fair  Recall:  [place] of Knowledge:Good  Language: Good  Akathisia:  No  Handed:    AIMS (if indicated):  has essential tremors on meds. No other inovlunaary movemetns  Assets:  Desire for Improvement Financial Resources/Insurance Housing Social Support  ADL's:  Intact  Cognition: mild decline,  Sleep:   irregular   Screenings: GAD-7    Flowsheet Row Office Visit from 01/26/2021 in [facility] Visit from 12/24/2020 in [facility]  Total GAD-7 Score 14 [place] Office Visit from 02/08/2021 in [facility]  Total Score (max 30 points ) 30      PHQ2-9    [place] Visit from 03/30/2021 in [facility] Office Visit  from 03/15/2021 in [facility] Office Visit from 01/26/2021 in [facility] Visit from 12/24/2020 in [facility]  PHQ-2 Total Score _0 PHQ-9 Total Score _1 [place] Office Visit from 03/30/2021 in [facility] ED to Hosp-Admission (Discharged) from 01/07/2021 in [facility] from 12/27/2020 in [facility]  C-SSRS RISK CATEGORY No Risk No Risk No Risk       Assessment and Plan: as follows  Major depressive recurrent moderate; he feels medication does help but physically he cannot do things which she was able to do before.  He does not feel blah in the morning but tired we will increase the medication Lexapro to 30 mg for depression add more activities and avoid naps or irregular sleep during the day so that he can have more consolidated sleep at night can continue Seroquel at night but down the road it is quite possible that he may not need more than 50 mg if he adjust his daytime sleepiness and reduces that  Adjustment disorder with depression and anxiety; considering comorbid medical condition and adapting to it and adjusting to psychosocial stress including mother-in-law being terminal.  We provided supportive therapy continue Lexapro we will increase it to 30 mg  Add more activity including reading books, going for a walk avoiding excessive daytime sleepiness  Provided supportive therapy follow-up in 1 and half to 2 months he is planning to have shoulder surgery in general he wants to come after that  Reviewed side effects.  If needed we can change Lexapro to Cymbalta but as of now we will increase the Lexapro   Total time spent in office face to face 50 minute including chart review and documentation.  Meds refill sent   Merian Capron, MD 11/29/202211:40 AM

## 2021-03-31 ENCOUNTER — Telehealth: Payer: Self-pay | Admitting: Cardiology

## 2021-03-31 DIAGNOSIS — Z79899 Other long term (current) drug therapy: Secondary | ICD-10-CM

## 2021-03-31 NOTE — Telephone Encounter (Signed)
Spoke to patient wife per dpr. She was informed it is ok for patient to know have appointment Friday. Also informed her of patient's lab results patient will come to have labs drawn in 3 weeks.

## 2021-03-31 NOTE — Telephone Encounter (Signed)
Wife called to get results for lab to see if th appt on Friday can be reschedule for later date. They have death in the family but if appt is needed they they will keep it. Please advise

## 2021-04-02 ENCOUNTER — Ambulatory Visit: Payer: Medicare Other | Admitting: Cardiology

## 2021-04-05 ENCOUNTER — Other Ambulatory Visit: Payer: Self-pay

## 2021-04-05 ENCOUNTER — Ambulatory Visit (INDEPENDENT_AMBULATORY_CARE_PROVIDER_SITE_OTHER): Payer: Medicare Other | Admitting: Family Medicine

## 2021-04-05 ENCOUNTER — Encounter: Payer: Self-pay | Admitting: Family Medicine

## 2021-04-05 VITALS — BP 102/54 | HR 74 | Temp 98.8°F | Resp 16 | Ht 73.0 in | Wt 261.1 lb

## 2021-04-05 DIAGNOSIS — E611 Iron deficiency: Secondary | ICD-10-CM | POA: Diagnosis not present

## 2021-04-05 DIAGNOSIS — J320 Chronic maxillary sinusitis: Secondary | ICD-10-CM

## 2021-04-05 DIAGNOSIS — H6123 Impacted cerumen, bilateral: Secondary | ICD-10-CM | POA: Diagnosis not present

## 2021-04-05 DIAGNOSIS — I2510 Atherosclerotic heart disease of native coronary artery without angina pectoris: Secondary | ICD-10-CM

## 2021-04-05 MED ORDER — FLUTICASONE PROPIONATE 50 MCG/ACT NA SUSP: 2.0000 | Freq: Every day | NASAL | 6 refills | Status: DC

## 2021-04-05 MED ORDER — SULFAMETHOXAZOLE-TRIMETHOPRIM 800-160 MG PO TABS: 1.0000 | ORAL_TABLET | Freq: Two times a day (BID) | ORAL | 0 refills | Status: DC

## 2021-04-05 NOTE — Progress Notes (Signed)
Acute Office Visit  Subjective:    Patient ID: Mark Thomas, male    DOB: 10-Dec-1950, 70 y.o.   MRN: [id number]  Chief Complaint  Patient presents with   Nasal Congestion    Slight non productive cough, 2 weeks    Spasms    At night in legs, Discuss Requip not working     HPI Patient is in today for nasal congestion x2 weeks.  No fevers chills or sweats.  Slight cough but nonproductive.  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  He ended up getting a severe fungal infection and was followed at [facility] for period of time.  He has had 11 sinus surgeries.  The last time he had a fungal infection of the sinuses was about 4 5 years ago and he is actually done fairly well since then.  But has been very congested and having significant symptoms for about 2 weeks and then started blowing out great brown discharge and chunks.  He has been having more pressure on in his face on the left side compared to the right.  He has responded to Bactrim, and Keflex and itraconazole in the past.  He sometimes uses Flonase but is out currently.  He also went to discuss his leg symptoms today.  He says that he has been diagnosed with restless leg and they have been gradually going up on his ropinirole from 0.5 mg now up to 3 mg without any improvement.  He says he never even notices temporary improvement in his symptoms he really describes it more as a leg jerking that happens most like a twitch or jerk it can happen while he is awake and sometimes happens while he is asleep it is bothersome enough that it does keep him awake at night and he will sometimes get up 5 and 6 times a night to walk because it does feel better if he walks.  Past Medical History:  Diagnosis Date   Anxiety    Coronary artery disease    Depression    Essential tremor    GERD (gastroesophageal reflux disease)    High cholesterol    Hypertension    Myocardial infarct (HCC)    Orthostatic  hypotension    Stroke ([place])    Vascular dementia ([place])     Past Surgical History:  Procedure Laterality Date   APPENDECTOMY     CARDIAC SURGERY     CHOLECYSTECTOMY     HERNIA REPAIR     NASAL SINUS SURGERY     SHOULDER SURGERY      Family History  Problem Relation Age of Onset   Hypertension Mother    Stroke Mother    Stroke Father    Hypertension Father     Social History   Socioeconomic History   Marital status: Married    Spouse name: Krist Rosenboom   Number of children: 2   Years of education: 20   Highest education level: Master's degree (e.g., MA, MS, MEng, MEd, MSW, MBA)  Occupational History   Occupation: Retired  Tobacco Use   Smoking status: Never    Passive exposure: Never   Smokeless tobacco: Never  Vaping Use   Vaping Use: Never used  Substance and Sexual Activity   Alcohol use: Not Currently   Drug use: Never   Sexual activity: Not on file  Other Topics Concern   Not on file  Social History Narrative  Lives with his wife. He has vascular dementia. His wife is his primary caretaker.He enjoys watching football and spending time with his grandchildren.   Social Determinants of Health   Financial Resource Strain: Low Risk    Difficulty of Paying Living Expenses: Not hard at all  Food Insecurity: No Food Insecurity   Worried About Charity fundraiser in the Last Year: Never true   [place] in the Last Year: Never true  Transportation Needs: No Transportation Needs   Lack of Transportation (Medical): No   Lack of Transportation (Non-Medical): No  Physical Activity: Insufficiently Active   Days of Exercise per Week: 2 days   Minutes of Exercise per Session: 50 min  Stress: No Stress Concern Present   Feeling of Stress : Not at all  Social Connections: Socially Integrated   Frequency of Communication with Friends and Family: More than three times a week   Frequency of Social Gatherings with Friends and Family: Once a week   Attends  Religious Services: More than 4 times per year   Active Member of [employer] or Organizations: Yes   Attends Music therapist: More than 4 times per year   Marital Status: Married  Human resources officer Violence: Not At Risk   Fear of Current or Ex-Partner: No   Emotionally Abused: No   Physically Abused: No   Sexually Abused: No    Outpatient Medications Prior to Visit  Medication Sig Dispense Refill   acetaminophen (TYLENOL) 500 MG tablet Take 1,000 mg by mouth every 6 (six) hours as needed for mild pain.     albuterol (VENTOLIN HFA) 108 (90 Base) MCG/ACT inhaler Inhale 2 puffs into the lungs every 4 (four) hours as needed for wheezing or shortness of breath.     allopurinol (ZYLOPRIM) 100 MG tablet Take 100 mg by mouth daily.     aspirin 81 MG EC tablet Take 1 tablet by mouth daily.     atorvastatin (LIPITOR) 80 MG tablet Take 80 mg by mouth daily.     Cholecalciferol (VITAMIN D-3) 125 MCG (5000 UT) TABS Take 1 tablet by mouth daily.     clopidogrel (PLAVIX) 75 MG tablet Take 75 mg by mouth daily.     escitalopram (LEXAPRO) 20 MG tablet Take 1.5 tablets (30 mg total) by mouth daily. 45 tablet 1   ferrous sulfate 325 (65 FE) MG EC tablet Take 1 tablet (325 mg total) by mouth in the morning and at bedtime. 180 tablet 1   furosemide (LASIX) 20 MG tablet Take 1 tablet (20 mg total) by mouth daily. 30 tablet 1   latanoprost (XALATAN) 0.005 % ophthalmic solution Place 1 drop into both eyes at bedtime.     losartan (COZAAR) 25 MG tablet Take 25 mg by mouth daily as needed (BP >160).     magnesium oxide (MAG-OX) 400 MG tablet Take 400 mg by mouth daily.     memantine (NAMENDA) 5 MG tablet Take 1 tablet (5 mg total) by mouth 2 (two) times daily. 180 tablet 2   metoprolol tartrate (LOPRESSOR) 25 MG tablet Take 12.5 mg by mouth 2 (two) times daily.     nitroGLYCERIN (NITROLINGUAL) 0.4 MG/SPRAY spray Place 1 spray under the tongue every 5 (five) minutes x 3 doses as needed for chest pain.      pantoprazole (PROTONIX) 40 MG tablet Take 40 mg by mouth every evening.     potassium citrate (UROCIT-K) 10 MEQ (1080 MG) SR tablet Take 10  mEq by mouth daily.     primidone (MYSOLINE) 50 MG tablet Take 100 mg by mouth 2 (two) times daily. Patient takes 200 mg twice a day     QUEtiapine (SEROQUEL) 50 MG tablet Take 2 tablets (100 mg total) by mouth at bedtime. 60 tablet 1   ranolazine (RANEXA) 1000 MG SR tablet Take 1,000 mg by mouth 2 (two) times daily.     rOPINIRole (REQUIP) 3 MG tablet Take 3 mg by mouth at bedtime.     triamcinolone ointment (KENALOG) 0.1 % Apply 1 application topically 2 (two) times daily.     No facility-administered medications prior to visit.    Allergies  Allergen Reactions   Dilaudid [Hydromorphone] Other (See Comments)    Respiratory Arrest   Codeine    Penicillins    Phenobarbital     Review of Systems     Objective:    Physical Exam Constitutional:      Appearance: He is well-developed.  HENT:     Head: Normocephalic and atraumatic.     Right Ear: External ear normal.     Left Ear: External ear normal.     Nose: Nose normal.  Eyes:     Conjunctiva/sclera: Conjunctivae normal.     Pupils: Pupils are equal, round, and reactive to light.  Neck:     Thyroid: No thyromegaly.  Cardiovascular:     Rate and Rhythm: Normal rate and regular rhythm.     Heart sounds: Normal heart sounds.  Pulmonary:     Effort: Pulmonary effort is normal.     Breath sounds: Normal breath sounds.  Musculoskeletal:     Cervical back: Neck supple.  Lymphadenopathy:     Cervical: No cervical adenopathy.  Skin:    General: Skin is warm and dry.  Neurological:     Mental Status: He is alert and oriented to person, place, and time.  Psychiatric:        Behavior: Behavior normal.    BP (!) 102/54   Pulse 74   Temp 98.8 F (37.1 C)   Resp 16   Ht _0  (1.854 m)   Wt 261 lb 1.3 oz (118.4 kg)   SpO2 96%   BMI 34.45 kg/m  Wt Readings from Last 3 Encounters:   04/05/21 261 lb 1.3 oz (118.4 kg)  02/08/21 253 lb (114.8 kg)  02/03/21 253 lb (114.8 kg)    There are no preventive care reminders to display for this patient.  There are no preventive care reminders to display for this patient.   Lab Results  Component Value Date   TSH 3.000 02/08/2021   Lab Results  Component Value Date   WBC 6.9 01/08/2021   HGB 12.8 (L) 01/08/2021   HCT 38.5 (L) 01/08/2021   MCV 95.1 01/08/2021   PLT 190 01/08/2021   Lab Results  Component Value Date   NA 137 03/29/2021   K 4.2 03/29/2021   CO2 21 03/29/2021   GLUCOSE 116 (H) 03/29/2021   BUN 23 03/29/2021   CREATININE 1.41 (H) 03/29/2021   BILITOT 0.6 01/08/2021   ALKPHOS 65 01/08/2021   AST 53 (H) 01/08/2021   ALT 40 01/08/2021   PROT 6.3 (L) 01/08/2021   ALBUMIN 3.4 (L) 01/08/2021   CALCIUM 9.3 03/29/2021   ANIONGAP 10 01/08/2021   EGFR 54 (L) 03/29/2021   Lab Results  Component Value Date   CHOL 131 01/07/2021   Lab Results  Component Value Date   HDL 47  01/07/2021   Lab Results  Component Value Date   LDLCALC 54 01/07/2021   Lab Results  Component Value Date   TRIG 152 (H) 01/07/2021   Lab Results  Component Value Date   CHOLHDL 2.8 01/07/2021   Lab Results  Component Value Date   HGBA1C 6.0 (H) 01/08/2021       Assessment & Plan:   Problem List Items Addressed This Visit   None Visit Diagnoses     Chronic maxillary sinusitis    -  Primary   Relevant Medications   fluticasone (FLONASE) 50 MCG/ACT nasal spray   sulfamethoxazole-trimethoprim (BACTRIM DS) 800-160 MG tablet   Bilateral impacted cerumen       Iron deficiency       Relevant Orders   Fe+TIBC+Fer      Sinusitis -  worse on the left compared to the right-discussed options.  He does typically respond well to Bactrim so we will send over a prescription for 28 days for chronic sinusitis.  We will have him follow-up with his PCP in about 3 weeks to make sure that he is improving and responding to the  antibiotics.  He has not established locally with an ENT since moving here from the [place] area.  But he does have a connection with someone at [facility] so if he does need a referral he would prefer to have it done where.  He is okay with starting with treatment for bacterial infection first and then covering for an antifungal infection if needed.  He would likely need cultures done if we do need to shift towards a fungal treatment option.  Bilateral cerumen impaction-recommend Debrox drops.  Leg jerking-recommend follow back up with PCP I am not sure that this is exactly restless leg is usually more of his sensation to move the legs not an actual jerking.  If it is happening while asleep most consistent with periodic limb movement benefits happening while awake that I do think it is different.  Consider other causes such as his back or PVD.    Meds ordered this encounter  Medications   fluticasone (FLONASE) 50 MCG/ACT nasal spray    Sig: Place 2 sprays into both nostrils daily.    Dispense:  16 g    Refill:  6   sulfamethoxazole-trimethoprim (BACTRIM DS) 800-160 MG tablet    Sig: Take 1 tablet by mouth 2 (two) times daily.    Dispense:  56 tablet    Refill:  0     Beatrice Lecher, MD

## 2021-04-09 ENCOUNTER — Telehealth: Payer: Self-pay

## 2021-04-09 ENCOUNTER — Encounter: Payer: Self-pay | Admitting: Family Medicine

## 2021-04-09 DIAGNOSIS — J320 Chronic maxillary sinusitis: Secondary | ICD-10-CM

## 2021-04-09 MED ORDER — CEFDINIR 300 MG PO CAPS: 300.0000 mg | ORAL_CAPSULE | Freq: Two times a day (BID) | ORAL | 0 refills | Status: DC

## 2021-04-09 NOTE — Telephone Encounter (Signed)
We will discontinue Bactrim and send over prescription for Omnicef.  He does have a penicillin allergy.  He cannot take Benadryl because of muscle spasms that he could try different antihistamine such as Allegra or Zyrtec and cool compresses for the rash.  It would be great if they could send [place] a picture just for documentation of the rash so we have an idea what it looks like.

## 2021-04-09 NOTE — Telephone Encounter (Signed)
Pt's spouse advised of new RX and to use Zyrtec and cool compresses.  Also advised spouse that if Zyrtec alone does not help with the hives, per Dr. Madilyn Fireman, that he may add ranitidine.  Requested that spouse send a picture of the rash thru MyChart for documentation purposes.  Spouse expressed understanding and is agreeable.  Charyl Bigger, CMA

## 2021-04-09 NOTE — Telephone Encounter (Signed)
Mark Thomas's wife called and states since he started the Bactrim he noticed a red rash all over his body. She states she would like a different medication. Please advise.

## 2021-04-12 ENCOUNTER — Other Ambulatory Visit: Payer: Self-pay

## 2021-04-12 ENCOUNTER — Encounter: Payer: Self-pay | Admitting: Medical-Surgical

## 2021-04-12 ENCOUNTER — Ambulatory Visit (INDEPENDENT_AMBULATORY_CARE_PROVIDER_SITE_OTHER): Payer: Medicare Other | Admitting: Medical-Surgical

## 2021-04-12 VITALS — BP 103/67 | HR 75 | Resp 20

## 2021-04-12 DIAGNOSIS — H60503 Unspecified acute noninfective otitis externa, bilateral: Secondary | ICD-10-CM | POA: Diagnosis not present

## 2021-04-12 DIAGNOSIS — H6123 Impacted cerumen, bilateral: Secondary | ICD-10-CM

## 2021-04-12 MED ORDER — CIPROFLOXACIN-DEXAMETHASONE 0.3-0.1 % OT SUSP: 4.0000 [drp] | Freq: Two times a day (BID) | OTIC | 0 refills | Status: AC

## 2021-04-12 NOTE — Progress Notes (Signed)
  HPI with pertinent ROS:   CC: bilateral ear pain  HPI: Pleasant 70 year old male presenting today for evaluation of bilateral ear pain.  He was seen on 12/9 by Dr. Madilyn Fireman for chronic maxillary sinusitis.  He was prescribed cefdinir orally which he is taking as prescribed.  At that appointment, his ears were noted to have cerumen impaction bilaterally.  He was instructed to use Debrox eardrops to help soften wax buildup.  They have been using Debrox per the manufacturer's instructions, 7 drops per ear.  Since he started using the Debrox, he has developed ear pain bilaterally, worse on the left.  Notes some wax has come out of the right side but nothing has come out of the left.  Feels that he has lost all hearing on the left side.  Has tried Tylenol for his earache with minimal relief.  I reviewed the past medical history, family history, social history, surgical history, and allergies today and no changes were needed.  Please see the problem list section below in epic for further details.   Physical exam:   General: Well Developed, well nourished, and in no acute distress.  Neuro: Alert and oriented x3.  HEENT: Normocephalic, atraumatic.  Right external ear canal and TM erythematous, no bulging, cone of light intact.  Left external ear canal blocked by moderate brown cerumen with what appears to be an abrasion along the inferior surface of the canal approximately 1-1.5 cm into the ear canal.  Initially unable to visualize TM of the left ear. After irrigation, TM visualized, erythema to both the canal and TM.  Skin: Warm and dry. Cardiac: Regular rate and rhythm, no murmurs rubs or gallops, no lower extremity edema.  Respiratory: Clear to auscultation bilaterally. Not using accessory muscles, speaking in full sentences.  Impression and Recommendations:    1. Bilateral impacted cerumen Indication: Cerumen impaction of the ear(s) Although hesitant to irrigate in the setting of current pain  and possible otitis externa, patient preferred to try irrigation for some relief of symptoms.  Medical necessity statement: On physical examination, cerumen impairs clinically significant portions of the external auditory canal, and tympanic membrane. Noted obstructive, copious cerumen that cannot be removed without magnification and instrumentations. Consent: Discussed benefits and risks of procedure and verbal consent obtained Procedure: Patient was prepped for the procedure. Utilized an otoscope to assess and take note of the ear canal, the tympanic membrane, and the presence, amount, and placement of the cerumen. Gentle water irrigation and soft plastic curette was utilized to remove cerumen.  Post procedure examination: shows cerumen was completely removed. Patient tolerated procedure well. The patient is made aware that they may experience temporary vertigo, temporary hearing loss, and temporary discomfort. If these symptom last for more than 24 hours to call the clinic or proceed to the ED.  2. Acute otitis externa of both ears, unspecified type Ciprodex 4 drops to each ear twice daily for 7 days. Ok to use Tylenol for pain relief. Consider warm/cold compresses for comfort.   Return if symptoms worsen or fail to improve. ___________________________________________ Clearnce Sorrel, DNP, APRN, FNP-BC [facility] and [place]

## 2021-04-12 NOTE — Progress Notes (Deleted)
  HPI with pertinent ROS:   CC: Bilateral ear pain  HPI:   I reviewed the past medical history, family history, social history, surgical history, and allergies today and no changes were needed.  Please see the problem list section below in epic for further details.   Physical exam:   General: Well Developed, well nourished, and in no acute distress.  Neuro: Alert and oriented x3, extra-ocular muscles intact, sensation grossly intact.  HEENT: Normocephalic, atraumatic, pupils equal round reactive to light, neck supple, no masses, no lymphadenopathy, thyroid nonpalpable.  Skin: Warm and dry, no rashes. Cardiac: Regular rate and rhythm, no murmurs rubs or gallops, no lower extremity edema.  Respiratory: Clear to auscultation bilaterally. Not using accessory muscles, speaking in full sentences.  Impression and Recommendations:    1. Bilateral impacted cerumen ***   No follow-ups on file. ___________________________________________ Clearnce Sorrel, DNP, APRN, FNP-BC [facility] and [place]

## 2021-04-13 ENCOUNTER — Telehealth: Payer: Self-pay | Admitting: Neurology

## 2021-04-13 LAB — IRON,TIBC AND FERRITIN PANEL
%SAT: 29 % (calc) (ref 20–48)
Ferritin: 64 ng/mL (ref 24–380)
Iron: 101 ug/dL (ref 50–180)
TIBC: 354 mcg/dL (calc) (ref 250–425)

## 2021-04-13 NOTE — Progress Notes (Signed)
Hi Kainan, iron levels actually look pretty good usually like to see the ferritin above 40 and the iron level above 50.  So if you are still taking iron I would recommend continuing it for an additional 3 months and then at that point you should be able to stop it.

## 2021-04-13 NOTE — Telephone Encounter (Signed)
Pt's wife, Brainard Highfill (on [place]) called, week ago he started having sharp pain in his head. Last about 30 seconds, sharp pain. Would like a call from the nurse.

## 2021-04-13 NOTE — Telephone Encounter (Signed)
I called patient's wife, Anne Ng, per DPR. I had an extended conversation with her. Patient has been complaining of a sharp pain in his head that lasts around 30 seconds.  The sharp pain comes once or twice a day each day for the past week and a half.  Patient's wife reports that he has a history of TIAs preceded by a bad headache.   Patient's wife reports that his prior neurologist was also caring for his hx of TIAs/strokes. They moved here in July and established neurological care with our office.  Patient currently is being treated for a sinus infection.  However, his sinus infection symptoms are improving on Omnicef.    I offered her an appointment with Dr. April Manson tomorrow tomorrow at 10:15 AM.  Patient accepted this appointment and will drive before 10 AM.  I advised patient's wife that if Dr. April Manson has any further recommendations that we will call her.

## 2021-04-14 ENCOUNTER — Ambulatory Visit: Payer: TRICARE For Life (TFL) | Admitting: Neurology

## 2021-04-15 DIAGNOSIS — K219 Gastro-esophageal reflux disease without esophagitis: Secondary | ICD-10-CM | POA: Insufficient documentation

## 2021-04-21 ENCOUNTER — Ambulatory Visit: Payer: TRICARE For Life (TFL) | Admitting: Cardiology

## 2021-04-23 ENCOUNTER — Other Ambulatory Visit: Payer: Self-pay | Admitting: Family Medicine

## 2021-04-23 DIAGNOSIS — U071 COVID-19: Secondary | ICD-10-CM

## 2021-04-23 MED ORDER — MOLNUPIRAVIR EUA 200MG CAPSULE: 4.0000 | ORAL_CAPSULE | Freq: Two times a day (BID) | ORAL | 0 refills | Status: DC

## 2021-04-23 MED ORDER — BENZONATATE 200 MG PO CAPS: 200.0000 mg | ORAL_CAPSULE | Freq: Two times a day (BID) | ORAL | 0 refills | Status: DC | PRN

## 2021-04-23 NOTE — Progress Notes (Signed)
On-call nurse notified me of patient calling in with positive COVID test and symptoms starting yesterday, requesting antiviral therapy. Symptoms include cough, headache, body aches, diarrhea, sore throat. No acute distress and aware of signs and symptoms requiring urgent evaluation. Not eligible for Paxlovid due to anticoagulation. Sending in molnupiravir instead along with tessalon for cough. Continue supportive measures including rest, hydration, humidifier use, steam showers, warm compresses to sinuses, warm liquids with lemon and honey, and over-the-counter cough, cold, and analgesics as needed.   Purcell Nails Olevia Bowens, DNP, FNP-C

## 2021-04-28 ENCOUNTER — Other Ambulatory Visit: Payer: Self-pay

## 2021-04-28 ENCOUNTER — Ambulatory Visit (INDEPENDENT_AMBULATORY_CARE_PROVIDER_SITE_OTHER): Payer: Medicare Other

## 2021-04-28 ENCOUNTER — Encounter: Payer: Self-pay | Admitting: Family Medicine

## 2021-04-28 ENCOUNTER — Ambulatory Visit (INDEPENDENT_AMBULATORY_CARE_PROVIDER_SITE_OTHER): Payer: Medicare Other | Admitting: Family Medicine

## 2021-04-28 VITALS — BP 105/69 | HR 82 | Temp 98.6°F | Ht 73.0 in | Wt 261.0 lb

## 2021-04-28 DIAGNOSIS — U071 COVID-19: Secondary | ICD-10-CM | POA: Diagnosis not present

## 2021-04-28 IMAGING — DX DG CHEST 2V
2 series · 2 of 2 positions shown · non-contrast
Comparison: Portable radiographs [DATE] and [DATE].

CLINICAL DATA: Shortness of breath and coughing for 4 days. Recent
positive COVID test.

EXAM:
CHEST - 2 VIEW

[chest pa]
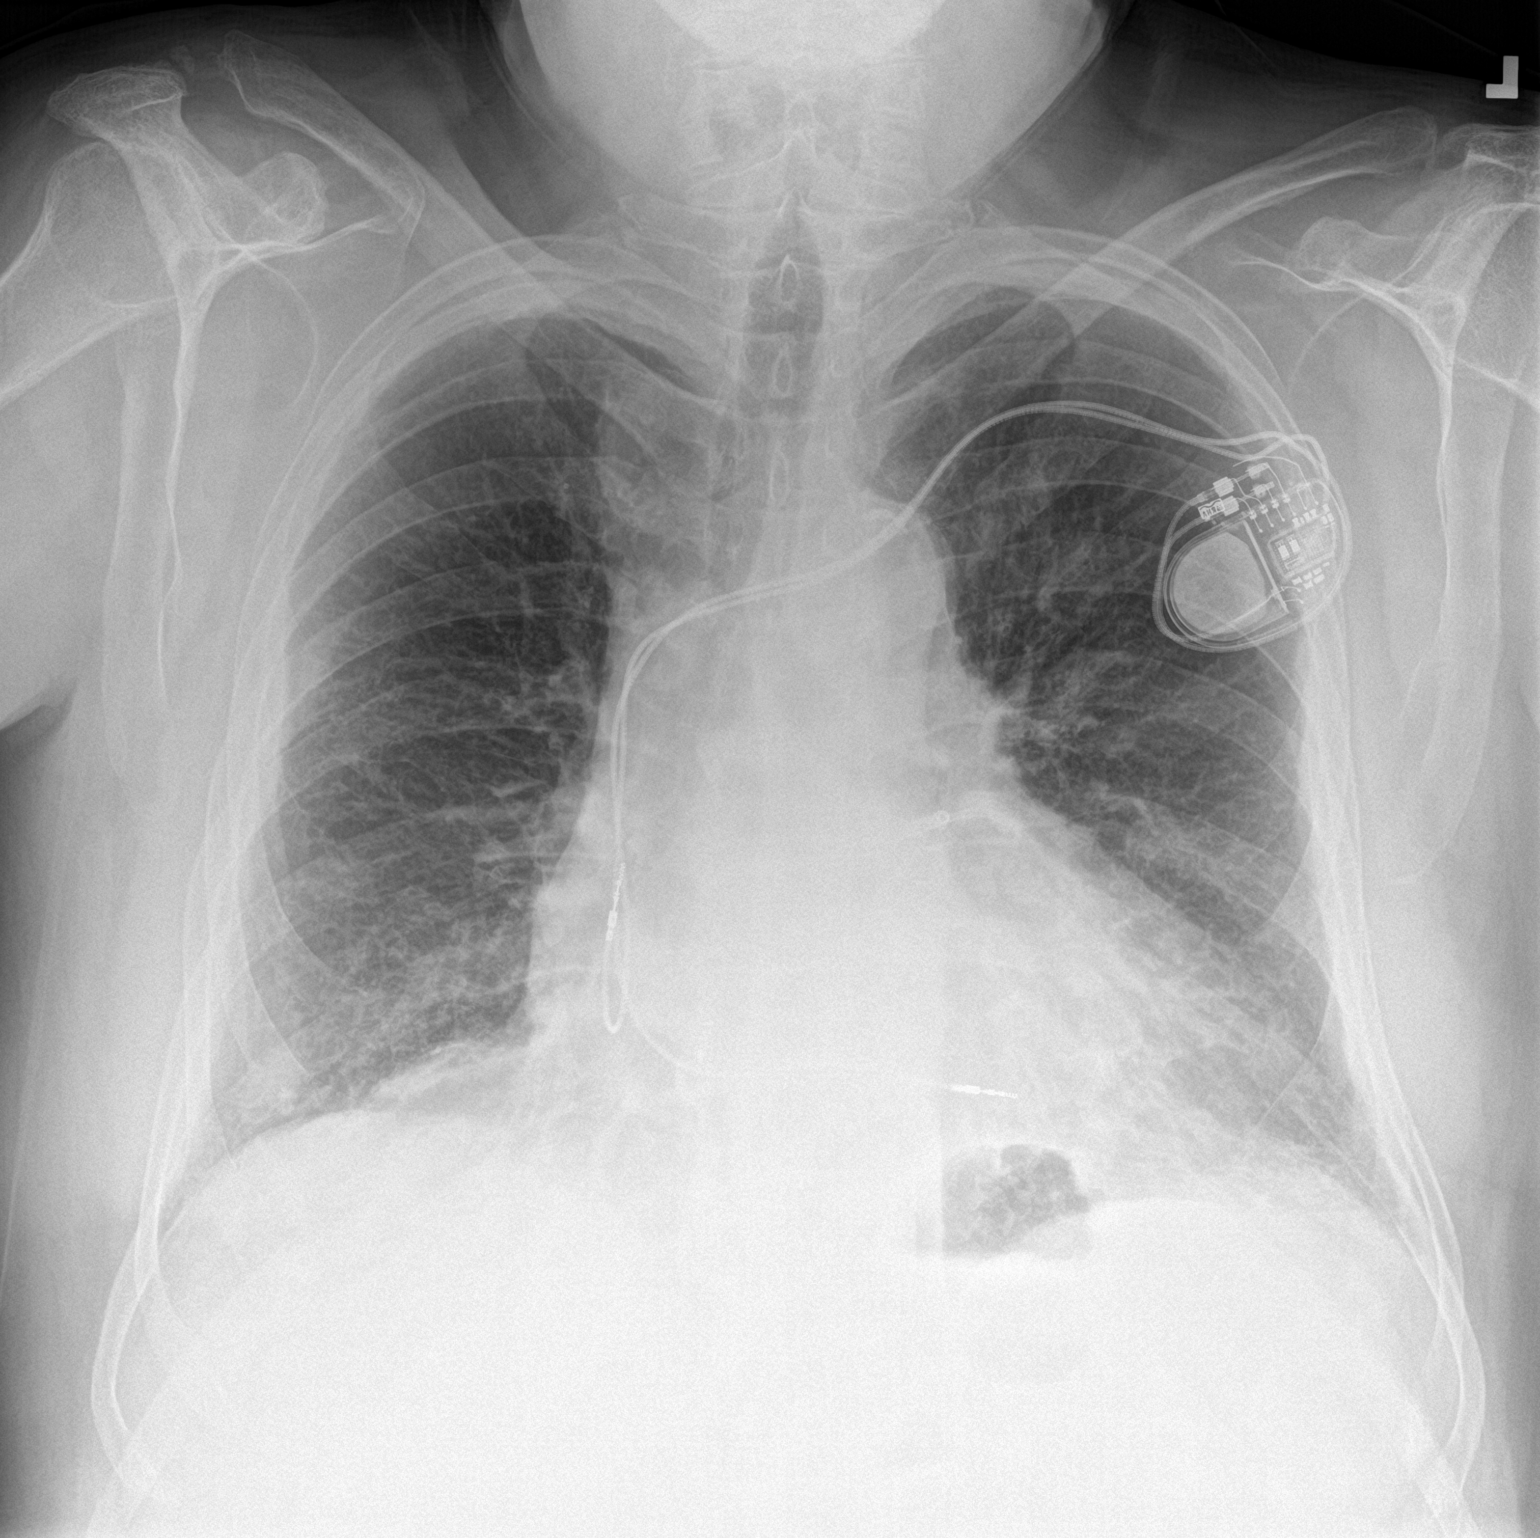

[chest lat]
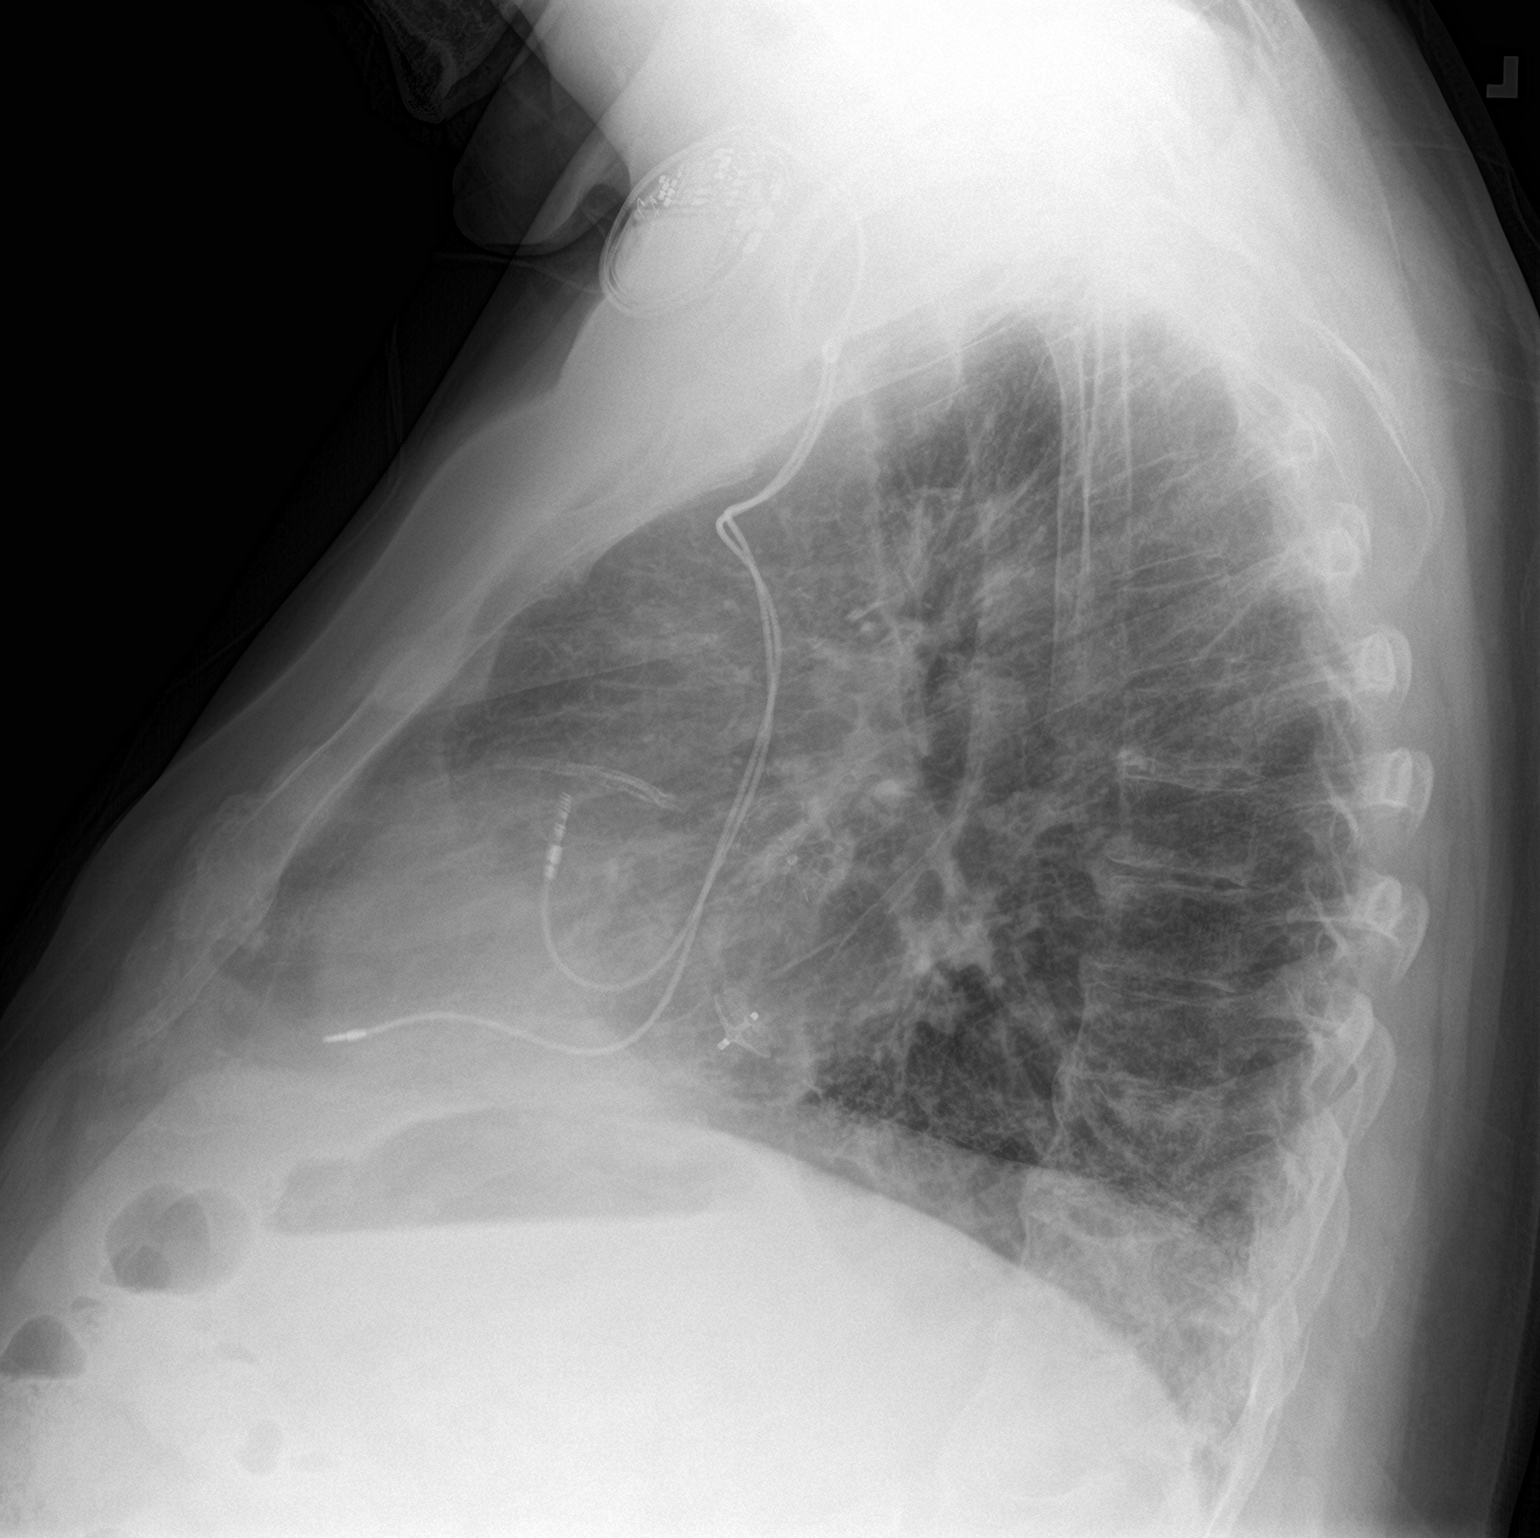

[2 of 2 positions shown; findings below may reference images not displayed]

FINDINGS: Left subclavian pacemaker leads are unchanged within the right
atrium and right ventricle. The heart is enlarged. Chronic
interstitial prominence at both lung bases appears similar to
previous portable radiographs and is attributed to fibrotic changes.
No definite superimposed edema, ground-glass opacity or confluent
airspace disease. There is no pleural effusion or pneumothorax. Mild
degenerative changes in the spine.
IMPRESSION: No definite acute findings or significant changes compared with
previous portable studies. Cardiomegaly with probable fibrotic
changes at both lung bases.

## 2021-04-28 MED ORDER — AZITHROMYCIN 250 MG PO TABS: ORAL_TABLET | ORAL | 0 refills | Status: AC

## 2021-04-28 MED ORDER — DEXAMETHASONE SODIUM PHOSPHATE 10 MG/ML IJ SOLN
10.0000 mg | Freq: Once | INTRAMUSCULAR | Status: AC
  Administered 2021-04-28: 14:00:00 10 mg via INTRAMUSCULAR

## 2021-04-28 MED ORDER — CEFTRIAXONE SODIUM 1 G IJ SOLR
1.0000 g | Freq: Once | INTRAMUSCULAR | Status: AC
  Administered 2021-04-28: 14:00:00 1 g via INTRAMUSCULAR

## 2021-04-28 MED ORDER — HYDROCOD POLST-CPM POLST ER 10-8 MG/5ML PO SUER: 5.0000 mL | Freq: Two times a day (BID) | ORAL | 0 refills | Status: DC | PRN

## 2021-04-28 MED ORDER — AMOXICILLIN-POT CLAVULANATE 875-125 MG PO TABS: 1.0000 | ORAL_TABLET | Freq: Two times a day (BID) | ORAL | 0 refills | Status: DC

## 2021-04-28 NOTE — Assessment & Plan Note (Signed)
Concern for developing pneumonia.  He will continue on Molnupiravir.  Chest x-ray ordered.  Given injection of Rocephin and dexamethasone today.  Continue albuterol as needed.  Course of Augmentin and azithromycin sent in.  Instructed to contact clinic if symptoms are worsening and seek emergency care if having increased difficulty breathing.

## 2021-04-28 NOTE — Patient Instructions (Signed)
Start augmentin Use tussionex twice daily as needed.  We'll be in touch with chest xray results.

## 2021-04-28 NOTE — Progress Notes (Signed)
Mark Thomas - 70 y.o. male MRN [id number]  Date of birth: 1950-07-28  Subjective Chief Complaint  Patient presents with   Covid Positive    HPI Mark Thomas is a 70 year old male recently tested positive for COVID here today for increased cough and dyspnea.  Started on Molnupiravir on 04/23/2021.  He has had increasing harsh cough and worsening dyspnea over the past couple of days.  Cough is nonproductive.  He has not had increased fevers, chills, nausea, vomiting or diarrhea.  He is using over-the-counter cough medication and Tessalon.  ROS:  A comprehensive ROS was completed and negative except as noted per HPI  Allergies  Allergen Reactions   Dilaudid [Hydromorphone] Other (See Comments)    Respiratory Arrest   Bactrim [Sulfamethoxazole-Trimethoprim] Rash   Benadryl [Diphenhydramine] Other (See Comments)    Muscle spasm    Phenobarbital     Past Medical History:  Diagnosis Date   Anxiety    Coronary artery disease    Depression    Essential tremor    GERD (gastroesophageal reflux disease)    High cholesterol    Hypertension    Myocardial infarct (HCC)    Orthostatic hypotension    Stroke ([place])    Vascular dementia ([place])     Past Surgical History:  Procedure Laterality Date   APPENDECTOMY     CARDIAC SURGERY     CHOLECYSTECTOMY     HERNIA REPAIR     NASAL SINUS SURGERY     SHOULDER SURGERY      Social History   Socioeconomic History   Marital status: Married    Spouse name: Germaine Shenker   Number of children: 2   Years of education: 20   Highest education level: Master's degree (e.g., MA, MS, MEng, MEd, MSW, MBA)  Occupational History   Occupation: Retired  Tobacco Use   Smoking status: Never    Passive exposure: Never   Smokeless tobacco: Never  Vaping Use   Vaping Use: Never used  Substance and Sexual Activity   Alcohol use: Not Currently   Drug use: Never   Sexual activity: Not on file  Other Topics Concern   Not on file  Social History  Narrative   Lives with his wife. He has vascular dementia. His wife is his primary caretaker.He enjoys watching football and spending time with his grandchildren.   Social Determinants of Health   Financial Resource Strain: Low Risk    Difficulty of Paying Living Expenses: Not hard at all  Food Insecurity: No Food Insecurity   Worried About Charity fundraiser in the Last Year: Never true   [place] in the Last Year: Never true  Transportation Needs: No Transportation Needs   Lack of Transportation (Medical): No   Lack of Transportation (Non-Medical): No  Physical Activity: Insufficiently Active   Days of Exercise per Week: 2 days   Minutes of Exercise per Session: 50 min  Stress: No Stress Concern Present   Feeling of Stress : Not at all  Social Connections: Socially Integrated   Frequency of Communication with Friends and Family: More than three times a week   Frequency of Social Gatherings with Friends and Family: Once a week   Attends Religious Services: More than 4 times per year   Active Member of [employer] or Organizations: Yes   Attends Music therapist: More than 4 times per year   Marital Status: Married    Family History  Problem Relation Age of Onset  Hypertension Mother    Stroke Mother    Stroke Father    Hypertension Father     Health Maintenance  Topic Date Due   COVID-19 Vaccine (4 - Booster for Moderna series) 06/11/2020   Zoster Vaccines- Shingrix (1 of 2) 06/15/2021 (Originally [birth year]/22/1971)   Hepatitis C Screening  03/15/2022 (Originally 12/22/1970)   TETANUS/TDAP  09/19/2024   COLONOSCOPY (Pts 45-36yr Insurance coverage will need to be confirmed)  08/09/2025   Pneumonia Vaccine 70 Years old  Completed   INFLUENZA VACCINE  Completed   HPV VACCINES  Aged Out      ----------------------------------------------------------------------------------------------------------------------------------------------------------------------------------------------------------------- Physical Exam BP 105/69 (BP Location: Left Arm, Patient Position: Sitting, Cuff Size: Large)    Pulse 82    Temp 98.6 F (37 C) (Oral)    Ht _0  (1.854 m)    Wt 261 lb (118.4 kg)    SpO2 97%    BMI 34.43 kg/m   Physical Exam Constitutional:      Appearance: Normal appearance.  Eyes:     General: No scleral icterus. Cardiovascular:     Rate and Rhythm: Normal rate and regular rhythm.  Pulmonary:     Comments: Coarse breath sounds on the right base. Musculoskeletal:     Cervical back: Neck supple.  Neurological:     General: No focal deficit present.     Mental Status: He is alert.  Psychiatric:        Mood and Affect: Mood normal.        Behavior: Behavior normal.    ------------------------------------------------------------------------------------------------------------------------------------------------------------------------------------------------------------------- Assessment and Plan  COVID-19 Concern for developing pneumonia.  He will continue on Molnupiravir.  Chest x-ray ordered.  Given injection of Rocephin and dexamethasone today.  Continue albuterol as needed.  Course of Augmentin and azithromycin sent in.  Instructed to contact clinic if symptoms are worsening and seek emergency care if having increased difficulty breathing.   Meds ordered this encounter  Medications   amoxicillin-clavulanate (AUGMENTIN) 875-125 MG tablet    Sig: Take 1 tablet by mouth 2 (two) times daily.    Dispense:  20 tablet    Refill:  0   chlorpheniramine-HYDROcodone (TUSSIONEX PENNKINETIC ER) 10-8 MG/5ML SUER    Sig: Take 5 mLs by mouth every 12 (twelve) hours as needed for cough.    Dispense:  115 mL    Refill:  0   dexamethasone (DECADRON) injection 10 mg    cefTRIAXone (ROCEPHIN) injection 1 g    No follow-ups on file.    This visit occurred during the SARS-CoV-2 public health emergency.  Safety protocols were in place, including screening questions prior to the visit, additional usage of staff PPE, and extensive cleaning of exam room while observing appropriate contact time as indicated for disinfecting solutions.

## 2021-05-03 ENCOUNTER — Encounter: Payer: Self-pay | Admitting: Family Medicine

## 2021-05-04 ENCOUNTER — Telehealth (HOSPITAL_COMMUNITY): Payer: Self-pay

## 2021-05-04 MED ORDER — PANTOPRAZOLE SODIUM 40 MG PO TBEC: 40.0000 mg | DELAYED_RELEASE_TABLET | Freq: Every evening | ORAL | 3 refills | Status: DC

## 2021-05-04 MED ORDER — ESCITALOPRAM OXALATE 20 MG PO TABS: 30.0000 mg | ORAL_TABLET | Freq: Every day | ORAL | 0 refills | Status: DC

## 2021-05-04 NOTE — Telephone Encounter (Signed)
Completed.

## 2021-05-04 NOTE — Telephone Encounter (Signed)
Medication refill request - Fax from pt's [facility] for a 90 day refill of patient's prescribed Escitalopram 20 mg.  Last ordered for 30 days on 03/30/21 + 1 refill.  Patient returns 06/01/21.  Requesting now from [place].

## 2021-05-05 ENCOUNTER — Encounter: Payer: Self-pay | Admitting: Family Medicine

## 2021-05-05 ENCOUNTER — Encounter: Payer: Self-pay | Admitting: Neurology

## 2021-05-05 ENCOUNTER — Ambulatory Visit (INDEPENDENT_AMBULATORY_CARE_PROVIDER_SITE_OTHER): Payer: Medicare Other | Admitting: Neurology

## 2021-05-05 ENCOUNTER — Other Ambulatory Visit: Payer: Self-pay

## 2021-05-05 ENCOUNTER — Ambulatory Visit (INDEPENDENT_AMBULATORY_CARE_PROVIDER_SITE_OTHER): Payer: Medicare Other | Admitting: Family Medicine

## 2021-05-05 VITALS — BP 94/60 | HR 78 | Ht 73.0 in | Wt 262.0 lb

## 2021-05-05 VITALS — BP 103/71 | HR 83 | Ht 73.0 in | Wt 255.0 lb

## 2021-05-05 DIAGNOSIS — U071 COVID-19: Secondary | ICD-10-CM | POA: Diagnosis not present

## 2021-05-05 DIAGNOSIS — R6889 Other general symptoms and signs: Secondary | ICD-10-CM

## 2021-05-05 DIAGNOSIS — F01B0 Vascular dementia, moderate, without behavioral disturbance, psychotic disturbance, mood disturbance, and anxiety: Secondary | ICD-10-CM

## 2021-05-05 DIAGNOSIS — G500 Trigeminal neuralgia: Secondary | ICD-10-CM | POA: Diagnosis not present

## 2021-05-05 MED ORDER — CARBAMAZEPINE 200 MG PO TABS: 200.0000 mg | ORAL_TABLET | Freq: Every day | ORAL | 0 refills | Status: DC

## 2021-05-05 MED ORDER — MEMANTINE HCL 10 MG PO TABS: 10.0000 mg | ORAL_TABLET | Freq: Two times a day (BID) | ORAL | 3 refills | Status: DC

## 2021-05-05 MED ORDER — FLUTICASONE PROPIONATE HFA 110 MCG/ACT IN AERO: 1.0000 | INHALATION_SPRAY | Freq: Every day | RESPIRATORY_TRACT | 1 refills | Status: DC

## 2021-05-05 MED ORDER — PRIMIDONE 50 MG PO TABS: 100.0000 mg | ORAL_TABLET | Freq: Two times a day (BID) | ORAL | 4 refills | Status: DC

## 2021-05-05 MED ORDER — METHYLPREDNISOLONE ACETATE 80 MG/ML IJ SUSP
80.0000 mg | Freq: Once | INTRAMUSCULAR | Status: AC
  Administered 2021-05-05: 80 mg via INTRAMUSCULAR

## 2021-05-05 MED ORDER — HYDROCOD POLST-CPM POLST ER 10-8 MG/5ML PO SUER: 5.0000 mL | Freq: Two times a day (BID) | ORAL | 0 refills | Status: DC | PRN

## 2021-05-05 MED ORDER — DOXYCYCLINE HYCLATE 100 MG PO TABS: 100.0000 mg | ORAL_TABLET | Freq: Two times a day (BID) | ORAL | 0 refills | Status: AC

## 2021-05-05 NOTE — Progress Notes (Signed)
[facility]  PATIENT: Mark Thomas DOB: 28-Oct-1950  REFERRING CLINICIAN: Luetta Nutting, DO HISTORY FROM: Patient and spouse  REASON FOR VISIT: Worsening memory.    HISTORICAL  CHIEF COMPLAINT:  Chief Complaint  Patient presents with   Follow-up    Rm 13. Accompanied by wife. Pt states memory may have deteriorated a little since last visit. Unsure if Namenda is helpful. Pt's wife would like to discuss requip. C/o left sided stabbing pain in head one to two times per day for the past 3-4 weeks. Pain lasts 4-5 seconds.    INTERVAL HISTORY 05/05/2021:  Patient presents today for follow-up with wife.  At last visit, plan was to start Namenda at 5 mg twice daily, she reports no improvement in terms of his memory, actually they think is worse, he is forgetful, has to ask same questions multiple times, wife has to remind him about his appointment, for instance today he asked what time was his appointment multiple times and wife responded him on each occasion.  He still having issues with his personal hygiene, forgetting to brush his teeth, forgetting to take a shower.  He was recently diagnosed with COVID infection, treated with antibiotics and still on antibiotics.  There is also new complaint of sharp shooting pain in the left V1 distribution that started about a month ago.  These pains are daily, described as sharp shooting pain lasting few seconds, has not tried any medication for it yet.     HISTORY OF PRESENT ILLNESS:  This is a 71 year old man with past medical history of anxiety depression, essential tremor, CAD, hypertension, hyperlipidemia, stroke and TIAs and vascular dementia who is presenting for worsening memory.  Per wife patient had 3 strokes in the last year and a half and in the early spring of this year he was diagnosed with vascular dementia.  Per history patient also had a formal neuropsych testing which confirmed the diagnosis of dementia.  From a  strokes he has residual right-sided weakness.  He has not been started on any medication for dementia.  Patient and wife recently moved from [place] in July and they also want to establish care with a local neurologist.  Currently his memory problems are described as repeating the same stuff over and over, asking the wife the same questions, asking about upcoming appointments, sometime is confused about the days.  He is currently not driving, after his third stroke and with his worsening right-sided weakness he was told not to drive.  Wife is handling all the finances, she had always handled the finances.  He reported he is not doing any cooking.  Wife also reported she had to set up his medications and sometimes remind him to take his medications otherwise he will forget.  She also has to remind him about his hygiene, remind him to brush his teeth, remind him to take a shower.   Patient also reported he has been more depressed than before.  He is supposed to see a psychiatrist next week.  He reported that his depression is taking a toll on his day to day activity, making his memory worse.  He gives the example of going out of town for a wedding but due to his depression the day of the wedding, he could not get out of bed, he reported he missed the wedding and he felt bad about it.  Patient reported history of shoulder injury, he is awaiting surgery.  Due to his upcoming surgery he has  been doing physical therapy, he reported he is keeping up with physical therapy and actually he seemed to benefit from physical therapy.  He also doing exercise at home. For his restless leg syndrome he is on Requip.  He is also diagnosed with essential tremor since July of last year after a second stroke.  He is on primidone 100 mg twice daily reported good control of his symptoms.   OTHER MEDICAL CONDITIONS: Anxiety/Depression, essential tremors, CAD, HTN, HLD, GERD, Stroke TIA, Vascular dementia   REVIEW OF SYSTEMS:  Full 14 system review of systems performed and negative with exception of: as noted in the HPI  ALLERGIES: Allergies  Allergen Reactions   Dilaudid [Hydromorphone] Other (See Comments)    Respiratory Arrest   Bactrim [Sulfamethoxazole-Trimethoprim] Rash   Benadryl [Diphenhydramine] Other (See Comments)    Muscle spasm    Phenobarbital     HOME MEDICATIONS: Outpatient Medications Prior to Visit  Medication Sig Dispense Refill   acetaminophen (TYLENOL) 500 MG tablet Take 1,000 mg by mouth every 6 (six) hours as needed for mild pain.     albuterol (VENTOLIN HFA) 108 (90 Base) MCG/ACT inhaler Inhale 2 puffs into the lungs every 4 (four) hours as needed for wheezing or shortness of breath.     allopurinol (ZYLOPRIM) 100 MG tablet Take 100 mg by mouth daily.     amoxicillin-clavulanate (AUGMENTIN) 875-125 MG tablet Take 1 tablet by mouth 2 (two) times daily. 20 tablet 0   aspirin 81 MG EC tablet Take 1 tablet by mouth daily.     atorvastatin (LIPITOR) 80 MG tablet Take 80 mg by mouth daily.     Cholecalciferol (VITAMIN D-3) 125 MCG (5000 UT) TABS Take 1 tablet by mouth daily.     clopidogrel (PLAVIX) 75 MG tablet Take 75 mg by mouth daily.     escitalopram (LEXAPRO) 20 MG tablet Take 1.5 tablets (30 mg total) by mouth daily. 135 tablet 0   ferrous sulfate 325 (65 FE) MG EC tablet Take 1 tablet (325 mg total) by mouth in the morning and at bedtime. 180 tablet 1   fluticasone (FLONASE) 50 MCG/ACT nasal spray Place 2 sprays into both nostrils daily. 16 g 6   furosemide (LASIX) 20 MG tablet Take 1 tablet (20 mg total) by mouth daily. 30 tablet 1   latanoprost (XALATAN) 0.005 % ophthalmic solution Place 1 drop into both eyes at bedtime.     losartan (COZAAR) 25 MG tablet Take 25 mg by mouth daily as needed (BP >160).     magnesium oxide (MAG-OX) 400 MG tablet Take 400 mg by mouth daily.     metoprolol tartrate (LOPRESSOR) 25 MG tablet Take 12.5 mg by mouth 2 (two) times daily.     nitroGLYCERIN  (NITROLINGUAL) 0.4 MG/SPRAY spray Place 1 spray under the tongue every 5 (five) minutes x 3 doses as needed for chest pain.     pantoprazole (PROTONIX) 40 MG tablet Take 1 tablet (40 mg total) by mouth every evening. 90 tablet 3   potassium citrate (UROCIT-K) 10 MEQ (1080 MG) SR tablet Take 10 mEq by mouth daily.     ranolazine (RANEXA) 1000 MG SR tablet Take 1,000 mg by mouth 2 (two) times daily.     rOPINIRole (REQUIP) 3 MG tablet Take 3 mg by mouth at bedtime.     triamcinolone ointment (KENALOG) 0.1 % Apply 1 application topically 2 (two) times daily.     chlorpheniramine-HYDROcodone (TUSSIONEX PENNKINETIC ER) 10-8 MG/5ML SUER Take 5 mLs  by mouth every 12 (twelve) hours as needed for cough. 115 mL 0   memantine (NAMENDA) 5 MG tablet Take 1 tablet (5 mg total) by mouth 2 (two) times daily. 180 tablet 2   primidone (MYSOLINE) 50 MG tablet Take 100 mg by mouth 2 (two) times daily. Patient takes 200 mg twice a day     QUEtiapine (SEROQUEL) 50 MG tablet Take 2 tablets (100 mg total) by mouth at bedtime. 60 tablet 1   benzonatate (TESSALON) 200 MG capsule Take 1 capsule (200 mg total) by mouth 2 (two) times daily as needed for cough. 20 capsule 0   No facility-administered medications prior to visit.    PAST MEDICAL HISTORY: Past Medical History:  Diagnosis Date   Anxiety    Coronary artery disease    Depression    Essential tremor    GERD (gastroesophageal reflux disease)    High cholesterol    Hypertension    Myocardial infarct (HCC)    Orthostatic hypotension    Stroke ([place])    Vascular dementia ([place])     PAST SURGICAL HISTORY: Past Surgical History:  Procedure Laterality Date   APPENDECTOMY     CARDIAC SURGERY     CHOLECYSTECTOMY     HERNIA REPAIR     NASAL SINUS SURGERY     SHOULDER SURGERY      FAMILY HISTORY: Family History  Problem Relation Age of Onset   Hypertension Mother    Stroke Mother    Stroke Father    Hypertension Father     SOCIAL HISTORY: Social  History   Socioeconomic History   Marital status: Married    Spouse name: Ean Gettel   Number of children: 2   Years of education: 20   Highest education level: Master's degree (e.g., MA, MS, MEng, MEd, MSW, MBA)  Occupational History   Occupation: Retired  Tobacco Use   Smoking status: Never    Passive exposure: Never   Smokeless tobacco: Never  Vaping Use   Vaping Use: Never used  Substance and Sexual Activity   Alcohol use: Not Currently   Drug use: Never   Sexual activity: Not on file  Other Topics Concern   Not on file  Social History Narrative   Lives with his wife. He has vascular dementia. His wife is his primary caretaker.He enjoys watching football and spending time with his grandchildren.   Social Determinants of Health   Financial Resource Strain: Low Risk    Difficulty of Paying Living Expenses: Not hard at all  Food Insecurity: No Food Insecurity   Worried About Charity fundraiser in the Last Year: Never true   [place] in the Last Year: Never true  Transportation Needs: No Transportation Needs   Lack of Transportation (Medical): No   Lack of Transportation (Non-Medical): No  Physical Activity: Insufficiently Active   Days of Exercise per Week: 2 days   Minutes of Exercise per Session: 50 min  Stress: No Stress Concern Present   Feeling of Stress : Not at all  Social Connections: Socially Integrated   Frequency of Communication with Friends and Family: More than three times a week   Frequency of Social Gatherings with Friends and Family: Once a week   Attends Religious Services: More than 4 times per year   Active Member of [employer] or Organizations: Yes   Attends Music therapist: More than 4 times per year   Marital Status: Married  Human resources officer Violence:  Not At Risk   Fear of Current or Ex-Partner: No   Emotionally Abused: No   Physically Abused: No   Sexually Abused: No     PHYSICAL EXAM  GENERAL  EXAM/CONSTITUTIONAL: Vitals:  Vitals:   05/05/21 1018  BP: 103/71  Pulse: 83  Weight: 255 lb (115.7 kg)  Height: _0  (1.854 m)    Body mass index is 33.64 kg/m. Wt Readings from Last 3 Encounters:  05/05/21 262 lb (118.8 kg)  05/05/21 255 lb (115.7 kg)  04/28/21 261 lb (118.4 kg)   Patient is in no distress; well developed, nourished and groomed; neck is supple  EYES: Pupils round and reactive to light, Visual fields full to confrontation, Extraocular movements intacts,   MUSCULOSKELETAL: Gait, strength, tone, movements noted in Neurologic exam below  NEUROLOGIC: MENTAL STATUS:  MMSE - [place] Exam 02/08/2021  Orientation to time 5  Orientation to Place 5  Registration 3  Attention/ Calculation 5  Recall 3  Language- name 2 objects 2  Language- repeat 1  Language- follow 3 step command 3  Language- read & follow direction 1  Write a sentence 1  Copy design 1  Total score 30    CRANIAL NERVE:  2nd, 3rd, 4th, 6th - pupils equal and reactive to light, visual fields full to confrontation, extraocular muscles intact, no nystagmus 5th - facial sensation symmetric 7th - facial strength symmetric 8th - hearing intact 9th - palate elevates symmetrically, uvula midline 11th - shoulder shrug symmetric 12th - tongue protrusion midline  MOTOR:  normal bulk and tone, full strength in the BUE, BLE  SENSORY:  normal and symmetric to light touch, pinprick, temperature, vibration  COORDINATION:  finger-nose-finger, fine finger movements normal  REFLEXES:  deep tendon reflexes present and symmetric  GAIT/STATION:  Walks with a  walker     DIAGNOSTIC DATA (LABS, IMAGING, TESTING) - I reviewed patient records, labs, notes, testing and imaging myself where available.  Lab Results  Component Value Date   WBC 6.9 01/08/2021   HGB 12.8 (L) 01/08/2021   HCT 38.5 (L) 01/08/2021   MCV 95.1 01/08/2021   PLT 190 01/08/2021      Component Value Date/Time    NA 137 03/29/2021 1158   K 4.2 03/29/2021 1158   CL 98 03/29/2021 1158   CO2 21 03/29/2021 1158   GLUCOSE 116 (H) 03/29/2021 1158   GLUCOSE 125 (H) 01/08/2021 0318   BUN 23 03/29/2021 1158   CREATININE 1.41 (H) 03/29/2021 1158   CALCIUM 9.3 03/29/2021 1158   PROT 6.3 (L) 01/08/2021 0318   ALBUMIN 3.4 (L) 01/08/2021 0318   AST 53 (H) 01/08/2021 0318   ALT 40 01/08/2021 0318   ALKPHOS 65 01/08/2021 0318   BILITOT 0.6 01/08/2021 0318   GFRNONAA 59 (L) 01/08/2021 0318   Lab Results  Component Value Date   CHOL 131 01/07/2021   HDL 47 01/07/2021   LDLCALC 54 01/07/2021   TRIG 152 (H) 01/07/2021   CHOLHDL 2.8 01/07/2021   Lab Results  Component Value Date   HGBA1C 6.0 (H) 01/08/2021   Lab Results  Component Value Date   VITAMINB12 334 02/08/2021   Lab Results  Component Value Date   TSH 3.000 02/08/2021    MRI Brain 01/08/2021:  No acute intracranial process. Redemonstrated encephalomalacia in the bilateral frontal lobes, subjacent to the craniotomy flap, with sequela of remote infarcts in the right corona radiata and left caudate. T2 hyperintense signal in the periventricular white matter,  likely the sequela of chronic small vessel ischemic disease. No abnormal enhancement.    ASSESSMENT AND PLAN  71 y.o. year old male with past medical history of anxiety depression, essential tremor, CAD, hypertension, hyperlipidemia, stroke and TIAs and vascular dementia who is presenting for follow up for his diagnosis of vascular dementia.  He was started on Namenda 5 mg twice daily but does not report any improvement of his symptoms.  I will go ahead and increase it to 10 mg twice daily.  I also recommended Tylenol B12 supplement since his B12 level was 334.  For his left-sided shooting pain, this is concerning for a trigeminal neuralgia in the V1 distribution, I will start him on low-dose Tegretol.  I will also obtain a EEG for worsening memory despite being on Namenda, I will contact  the patient to go over the results.  I will follow-up in 20-monthor sooner if worse.    1. Moderate vascular dementia without behavioral disturbance, psychotic disturbance, mood disturbance, or anxiety   2. Other general symptoms and signs   3. Trigeminal neuralgia of left side of face     Patient Instructions  Continue current medications  Increase Namenda to 10 mg twice daily  Start Vitamin B12 supplement, can get B complex multivitamin  Start Tegretol 200 mg for trigeminal neuralgia  EEG for worsening memory, rule out seizures  Return to clinic in 6 months or sooner if worse       There are well-accepted and sensible ways to reduce risk for Alzheimers disease and other degenerative brain disorders .  Exercise Daily Walk A daily 20 minute walk should be part of your routine. Disease related apathy can be a significant roadblock to exercise and the only way to overcome this is to make it a daily routine and perhaps have a reward at the end (something your loved one loves to eat or drink perhaps) or a personal trainer coming to the home can also be very useful. Most importantly, the patient is much more likely to exercise if the caregiver / spouse does it with him/her. In general a structured, repetitive schedule is best.  General Health: Any diseases which effect your body will effect your brain such as a pneumonia, urinary infection, blood clot, heart attack or stroke. Keep contact with your primary care doctor for regular follow ups.  Sleep. A good nights sleep is healthy for the brain. Seven hours is recommended. If you have insomnia or poor sleep habits we can give you some instructions. If you have sleep apnea wear your mask.  Diet: Eating a heart healthy diet is also a good idea; fish and poultry instead of red meat, nuts (mostly non-peanuts), vegetables, fruits, olive oil or canola oil (instead of butter), minimal salt (use other spices to flavor foods), whole grain rice, bread,  cereal and pasta and wine in moderation.Research is now showing that the MIND diet, which is a combination of The Mediterranean diet and the DASH diet, is beneficial for cognitive processing and longevity. Information about this diet can be found in The MIND Diet, a book by MDoyne Keel MS, RDN, and online at [employer]book[employer].si Finances, Power of Attorney and Advance Directives: You should consider putting legal safeguards in place with regard to financial and medical decision making. While the spouse always has power of attorney for medical and financial issues in the absence of any form, you should consider what you want in case the spouse / caregiver is no longer around  or capable of making decisions.        Orders Placed This Encounter  Procedures   EEG adult     Meds ordered this encounter  Medications   memantine (NAMENDA) 10 MG tablet    Sig: Take 1 tablet (10 mg total) by mouth 2 (two) times daily.    Dispense:  180 tablet    Refill:  3   carbamazepine (TEGRETOL) 200 MG tablet    Sig: Take 1 tablet (200 mg total) by mouth daily for 30 doses.    Dispense:  30 tablet    Refill:  0   primidone (MYSOLINE) 50 MG tablet    Sig: Take 2 tablets (100 mg total) by mouth 2 (two) times daily. Patient takes 200 mg twice a day    Dispense:  120 tablet    Refill:  4     Return in about 6 months (around 11/02/2021).    Alric Ran, MD 05/05/2021, 4:20 PM  [facility] [address] [place], [place] [address] [phone]

## 2021-05-05 NOTE — Assessment & Plan Note (Signed)
Still some coarse breath sounds in the right base.  Complete course of Augmentin and adding doxycycline.  Given injection of Depo-Medrol today as we are out of Solu-Medrol.  Adding Flovent and Tussionex renewed.  Continue albuterol as needed.  Contact clinic if he continues to have worsening symptoms.

## 2021-05-05 NOTE — Progress Notes (Signed)
Mark Thomas - 71 y.o. male MRN [id number]  Date of birth: [birth year]  Subjective Chief Complaint  Patient presents with   Pneumonia    HPI Mark Thomas is a 71 year old male here today for follow-up of continued cough post COVID.  He was seen last week.  Concern for pneumonia at that time and given injection of Rocephin as well as Solu-Medrol.  He was continued on course of Augmentin and azithromycin.  He did feel like he had some improvement compared to last week however continues to have harsh cough.  He does have some intermittent wheezing and dyspnea.  He is using albuterol inhaler as needed and does get some relief with this.  He also does get relief with Tussionex.  He has not had continued fevers or chest pain.  ROS:  A comprehensive ROS was completed and negative except as noted per HPI  Allergies  Allergen Reactions   Dilaudid [Hydromorphone] Other (See Comments)    Respiratory Arrest   Bactrim [Sulfamethoxazole-Trimethoprim] Rash   Benadryl [Diphenhydramine] Other (See Comments)    Muscle spasm    Phenobarbital     Past Medical History:  Diagnosis Date   Anxiety    Coronary artery disease    Depression    Essential tremor    GERD (gastroesophageal reflux disease)    High cholesterol    Hypertension    Myocardial infarct (HCC)    Orthostatic hypotension    Stroke ([place])    Vascular dementia ([place])     Past Surgical History:  Procedure Laterality Date   APPENDECTOMY     CARDIAC SURGERY     CHOLECYSTECTOMY     HERNIA REPAIR     NASAL SINUS SURGERY     SHOULDER SURGERY      Social History   Socioeconomic History   Marital status: Married    Spouse name: Mark Thomas   Number of children: 2   Years of education: 20   Highest education level: Master's degree (e.g., MA, MS, MEng, MEd, MSW, MBA)  Occupational History   Occupation: Retired  Tobacco Use   Smoking status: Never    Passive exposure: Never   Smokeless tobacco: Never  Vaping Use   Vaping Use:  Never used  Substance and Sexual Activity   Alcohol use: Not Currently   Drug use: Never   Sexual activity: Not on file  Other Topics Concern   Not on file  Social History Narrative   Lives with his wife. He has vascular dementia. His wife is his primary caretaker.He enjoys watching football and spending time with his grandchildren.   Social Determinants of Health   Financial Resource Strain: Low Risk    Difficulty of Paying Living Expenses: Not hard at all  Food Insecurity: No Food Insecurity   Worried About Charity fundraiser in the Last Year: Never true   [place] in the Last Year: Never true  Transportation Needs: No Transportation Needs   Lack of Transportation (Medical): No   Lack of Transportation (Non-Medical): No  Physical Activity: Insufficiently Active   Days of Exercise per Week: 2 days   Minutes of Exercise per Session: 50 min  Stress: No Stress Concern Present   Feeling of Stress : Not at all  Social Connections: Socially Integrated   Frequency of Communication with Friends and Family: More than three times a week   Frequency of Social Gatherings with Friends and Family: Once a week   Attends Religious Services: More than  4 times per year   Active Member of Clubs or Organizations: Yes   Attends Archivist Meetings: More than 4 times per year   Marital Status: Married    Family History  Problem Relation Age of Onset   Hypertension Mother    Stroke Mother    Stroke Father    Hypertension Father     Health Maintenance  Topic Date Due   COVID-19 Vaccine (4 - Booster for Moderna series) 06/11/2020   Zoster Vaccines- Shingrix (1 of 2) 06/15/2021 (Originally 12/22/1969)   Hepatitis C Screening  03/15/2022 (Originally 12/22/1968)   TETANUS/TDAP  09/19/2024   COLONOSCOPY (Pts 45-80yr Insurance coverage will need to be confirmed)  08/09/2025   Pneumonia Vaccine 71 Years old  Completed   INFLUENZA VACCINE  Completed   HPV VACCINES  Aged Out      ----------------------------------------------------------------------------------------------------------------------------------------------------------------------------------------------------------------- Physical Exam BP 94/60 (BP Location: Left Arm, Patient Position: Sitting, Cuff Size: Large)    Pulse 78    Ht 6' 1" (1.854 m)    Wt 262 lb (118.8 kg)    SpO2 97%    BMI 34.57 kg/m   Physical Exam Constitutional:      Appearance: Normal appearance.  Cardiovascular:     Rate and Rhythm: Normal rate and regular rhythm.  Pulmonary:     Comments: Still with some mild coarse breath sounds in the right base. Musculoskeletal:     Cervical back: Neck supple.  Neurological:     Mental Status: He is alert.  Psychiatric:        Mood and Affect: Mood normal.        Behavior: Behavior normal.    ------------------------------------------------------------------------------------------------------------------------------------------------------------------------------------------------------------------- Assessment and Plan  COVID-19 Still some coarse breath sounds in the right base.  Complete course of Augmentin and adding doxycycline.  Given injection of Depo-Medrol today as we are out of Solu-Medrol.  Adding Flovent and Tussionex renewed.  Continue albuterol as needed.  Contact clinic if he continues to have worsening symptoms.   Meds ordered this encounter  Medications   doxycycline (VIBRA-TABS) 100 MG tablet    Sig: Take 1 tablet (100 mg total) by mouth 2 (two) times daily for 10 days.    Dispense:  20 tablet    Refill:  0   fluticasone (FLOVENT HFA) 110 MCG/ACT inhaler    Sig: Inhale 1 puff into the lungs daily.    Dispense:  1 each    Refill:  1   chlorpheniramine-HYDROcodone (TUSSIONEX PENNKINETIC ER) 10-8 MG/5ML SUER    Sig: Take 5 mLs by mouth every 12 (twelve) hours as needed for cough.    Dispense:  150 mL    Refill:  0   methylPREDNISolone acetate (DEPO-MEDROL)  injection 80 mg    Return in about 2 weeks (around 05/19/2021) for Cough/Wheezing.    This visit occurred during the SARS-CoV-2 public health emergency.  Safety protocols were in place, including screening questions prior to the visit, additional usage of staff PPE, and extensive cleaning of exam room while observing appropriate contact time as indicated for disinfecting solutions.

## 2021-05-05 NOTE — Patient Instructions (Signed)
Add flovent daily.  Rinse mouth afterwards.  Continue albuterol as needed.  Add doxycycline.  See me again in 2 weeks.

## 2021-05-05 NOTE — Patient Instructions (Addendum)
Continue current medications  Increase Namenda to 10 mg twice daily  Start Vitamin B12 supplement, can get B complex multivitamin  Start Tegretol 200 mg for trigeminal neuralgia  EEG for worsening memory, rule out seizures  Return to clinic in 6 months or sooner if worse

## 2021-05-06 ENCOUNTER — Telehealth: Payer: Self-pay | Admitting: Neurology

## 2021-05-06 ENCOUNTER — Other Ambulatory Visit: Payer: Self-pay | Admitting: Neurology

## 2021-05-06 MED ORDER — ROPINIROLE HCL ER 4 MG PO TB24: 4.0000 mg | ORAL_TABLET | Freq: Every day | ORAL | 3 refills | Status: DC

## 2021-05-06 NOTE — Telephone Encounter (Signed)
Pt's wife, Naseer Hearn request refill for rOPINIRole (REQUIP) 3 MG tablet at [facility]

## 2021-05-06 NOTE — Telephone Encounter (Signed)
I spoke to Dr. April Manson. He is agreeable to take over this prescription for the patient's RLS. He has increased the dose to ropinirole 5m, one tablet at bedtime. 90-day supply sent to [facility]. The patient's wife is aware of the dose change.

## 2021-05-06 NOTE — Telephone Encounter (Signed)
Pt said, last office visit Dr. April Manson would take over filling the medication.   Message the nurse, nurse response " I will discuss it with him. I didn't see in the note. Please let her know I will request it. We will only call back if he declines. Otherwise, she can check with the pharmacy a little later today."

## 2021-05-07 ENCOUNTER — Other Ambulatory Visit: Payer: Self-pay | Admitting: *Deleted

## 2021-05-07 ENCOUNTER — Other Ambulatory Visit (HOSPITAL_COMMUNITY): Payer: Self-pay

## 2021-05-07 MED ORDER — QUETIAPINE FUMARATE 50 MG PO TABS: 100.0000 mg | ORAL_TABLET | Freq: Every evening | ORAL | 0 refills | Status: DC

## 2021-05-07 MED ORDER — FLUTICASONE PROPIONATE 50 MCG/ACT NA SUSP: 2.0000 | Freq: Every day | NASAL | 6 refills | Status: DC

## 2021-05-10 ENCOUNTER — Other Ambulatory Visit: Payer: Self-pay | Admitting: *Deleted

## 2021-05-10 MED ORDER — PRIMIDONE 50 MG PO TABS: 100.0000 mg | ORAL_TABLET | Freq: Two times a day (BID) | ORAL | 4 refills | Status: DC

## 2021-05-11 ENCOUNTER — Other Ambulatory Visit: Payer: Self-pay

## 2021-05-11 MED ORDER — MEMANTINE HCL 10 MG PO TABS: 10.0000 mg | ORAL_TABLET | Freq: Two times a day (BID) | ORAL | 0 refills | Status: DC

## 2021-05-11 NOTE — Progress Notes (Signed)
Rx resent to [facility], who requested 90 day supply.

## 2021-05-12 ENCOUNTER — Telehealth: Payer: Self-pay | Admitting: Neurology

## 2021-05-12 ENCOUNTER — Ambulatory Visit (INDEPENDENT_AMBULATORY_CARE_PROVIDER_SITE_OTHER): Payer: Medicare Other | Admitting: Neurology

## 2021-05-12 DIAGNOSIS — R410 Disorientation, unspecified: Secondary | ICD-10-CM | POA: Diagnosis not present

## 2021-05-12 DIAGNOSIS — R6889 Other general symptoms and signs: Secondary | ICD-10-CM

## 2021-05-12 DIAGNOSIS — F01B0 Vascular dementia, moderate, without behavioral disturbance, psychotic disturbance, mood disturbance, and anxiety: Secondary | ICD-10-CM

## 2021-05-12 MED ORDER — ROPINIROLE HCL ER 4 MG PO TB24: 4.0000 mg | ORAL_TABLET | Freq: Every day | ORAL | 0 refills | Status: DC

## 2021-05-12 NOTE — Telephone Encounter (Signed)
Refill has been sent.

## 2021-05-12 NOTE — Addendum Note (Signed)
Addended by: Verlin Grills on: 05/12/2021 11:11 AM   Modules accepted: Orders

## 2021-05-12 NOTE — Telephone Encounter (Signed)
rOPINIRole (REQUIP XL) 4 MG 24 hr tablet [[id number]] , pt needs [facility] to [facility] on [address] [place].

## 2021-05-12 NOTE — Procedures (Signed)
° ° °  History:  71 year old gentleman with dementia   EEG classification: Awake and drowsy  Description of the recording: The background rhythms of this recording is diffusely slow with frequency of 5-7 Hz. As the record progresses, the patient appears to remain in the waking state throughout the recording. Photic stimulation was performed, did not show any abnormalities. Hyperventilation was not performed. Toward the end of the recording, the patient enters the drowsy state with slight symmetric slowing seen. The patient never enters stage II sleep. No abnormal epileptiform discharges seen during this recording. There was diffuse slowing. EKG monitor shows no evidence of cardiac rhythm abnormalities with a heart rate of 72.  Impression: This is an abnormal EEG recording in the waking and drowsy state due to mild diffuse slowing. Diffuse slowing is consistent with a generalized brain dysfunction such as in encephalopathy. Diffuse slowing can also be seen in patient with dementia. No seizures or epileptiform discharges seen during this recording.    Alric Ran, MD [facility]

## 2021-05-21 ENCOUNTER — Ambulatory Visit: Admitting: Family Medicine

## 2021-05-23 ENCOUNTER — Other Ambulatory Visit (HOSPITAL_COMMUNITY): Payer: Self-pay | Admitting: Psychiatry

## 2021-05-27 ENCOUNTER — Other Ambulatory Visit: Payer: Self-pay

## 2021-05-27 ENCOUNTER — Other Ambulatory Visit: Payer: Self-pay | Admitting: *Deleted

## 2021-05-27 MED ORDER — CARBAMAZEPINE 200 MG PO TABS: 200.0000 mg | ORAL_TABLET | Freq: Every day | ORAL | 0 refills | Status: DC

## 2021-05-27 MED ORDER — ROPINIROLE HCL ER 4 MG PO TB24: 4.0000 mg | ORAL_TABLET | Freq: Every day | ORAL | 0 refills | Status: DC

## 2021-05-28 ENCOUNTER — Encounter: Payer: Self-pay | Admitting: Neurology

## 2021-05-31 ENCOUNTER — Other Ambulatory Visit: Payer: Self-pay | Admitting: Neurology

## 2021-05-31 MED ORDER — OXCARBAZEPINE 300 MG PO TABS: 300.0000 mg | ORAL_TABLET | Freq: Every day | ORAL | 2 refills | Status: DC

## 2021-05-31 MED ORDER — CARBAMAZEPINE 200 MG PO TABS: 200.0000 mg | ORAL_TABLET | Freq: Two times a day (BID) | ORAL | 3 refills | Status: DC

## 2021-06-01 ENCOUNTER — Telehealth: Payer: Self-pay | Admitting: *Deleted

## 2021-06-01 ENCOUNTER — Ambulatory Visit (HOSPITAL_COMMUNITY): Payer: TRICARE For Life (TFL) | Admitting: Psychiatry

## 2021-06-01 MED ORDER — OXCARBAZEPINE 300 MG PO TABS: 300.0000 mg | ORAL_TABLET | Freq: Every day | ORAL | 2 refills | Status: DC

## 2021-06-01 NOTE — Telephone Encounter (Signed)
Received a fax from [facility] notifying Dr. April Manson the patient has been prescribed ranolazine which flagged a drug interaction with his carbamazepine. Dr. April Manson has changed the patient's therapy to oxcarbazepine 371m, one tablet daily. New rx sent to [facility]. I spoke to the patient and his wife who are aware of this change. They would [facility] [address] [facility] has been update they are going to another pharmacy.

## 2021-06-02 ENCOUNTER — Encounter: Payer: Self-pay | Admitting: Family Medicine

## 2021-06-03 ENCOUNTER — Encounter (HOSPITAL_COMMUNITY): Payer: Self-pay | Admitting: Psychiatry

## 2021-06-03 ENCOUNTER — Ambulatory Visit (INDEPENDENT_AMBULATORY_CARE_PROVIDER_SITE_OTHER): Payer: Medicare Other | Admitting: Psychiatry

## 2021-06-03 VITALS — BP 102/70 | Temp 97.7°F | Ht 73.0 in | Wt 262.0 lb

## 2021-06-03 DIAGNOSIS — F331 Major depressive disorder, recurrent, moderate: Secondary | ICD-10-CM | POA: Diagnosis not present

## 2021-06-03 DIAGNOSIS — F411 Generalized anxiety disorder: Secondary | ICD-10-CM

## 2021-06-03 DIAGNOSIS — F4323 Adjustment disorder with mixed anxiety and depressed mood: Secondary | ICD-10-CM | POA: Diagnosis not present

## 2021-06-03 NOTE — Progress Notes (Signed)
[place] Follow up visit  Patient Identification: Mark Thomas MRN:  [id number] Date of Evaluation:  06/03/2021 Referral Source: pcp Chief Complaint:  establish care, depression Visit Diagnosis:    ICD-10-CM   1. Moderate episode of recurrent major depressive disorder (HCC)  F33.1     2. Adjustment disorder with mixed anxiety and depressed mood  F43.23     3. Anxiety state  F41.1       History of Present Illness: Patient is a 71 years old currently married Caucasian male referred initially by primary care physician establish care for depression he has multiple medical comorbidities including history of stroke, coronary artery disease, vascular dementia  Patient states he has been treated for depression for the last more than 5 to 7 years and has stopped alcohol around 3 years ago they have moved from [place] and has been on Lexapro and Seroquel.  Does not endorse psychotic symptoms does not endorse delusions  Last visit we increased lexapro for depression, anxiety He is suffering from dementia is on nemanda by neurology Gets forgetfull in hte morning and causes anxiety Wants some thing for anxiety Discussed in detail of risk of increase med or benzo and further effect to dementia  Has a supportive wife   Aggravating factors; medical comorbidities including stroke.  Family stressors, memory concerns  Modifying factors; wife Duration 10 years plus Severity increase anxiety     Past Psychiatric History: depression, alcohol dependence  Previous Psychotropic Medications: No   Substance Abuse History in the last 12 months:  No.  Consequences of Substance Abuse: NA  Past Medical History:  Past Medical History:  Diagnosis Date   Anxiety    Coronary artery disease    Depression    Essential tremor    GERD (gastroesophageal reflux disease)    High cholesterol    Hypertension    Myocardial infarct (HCC)    Orthostatic hypotension    Stroke ([place])    Vascular dementia  ([place])     Past Surgical History:  Procedure Laterality Date   APPENDECTOMY     CARDIAC SURGERY     CHOLECYSTECTOMY     HERNIA REPAIR     NASAL SINUS SURGERY     SHOULDER SURGERY      Family Psychiatric History: mother : anxiety  Family History:  Family History  Problem Relation Age of Onset   Hypertension Mother    Stroke Mother    Stroke Father    Hypertension Father     Social History:   Social History   Socioeconomic History   Marital status: Married    Spouse name: Janes Colegrove   Number of children: 2   Years of education: 20   Highest education level: Master's degree (e.g., MA, MS, MEng, MEd, MSW, MBA)  Occupational History   Occupation: Retired  Tobacco Use   Smoking status: Never    Passive exposure: Never   Smokeless tobacco: Never  Vaping Use   Vaping Use: Never used  Substance and Sexual Activity   Alcohol use: Not Currently   Drug use: Never   Sexual activity: Not on file  Other Topics Concern   Not on file  Social History Narrative   Lives with his wife. He has vascular dementia. His wife is his primary caretaker.He enjoys watching football and spending time with his grandchildren.   Social Determinants of Health   Financial Resource Strain: Low Risk    Difficulty of Paying Living Expenses: Not hard at all  Food  Insecurity: No Food Insecurity   Worried About Charity fundraiser in the Last Year: Never true   Ran Out of Food in the Last Year: Never true  Transportation Needs: No Transportation Needs   Lack of Transportation (Medical): No   Lack of Transportation (Non-Medical): No  Physical Activity: Insufficiently Active   Days of Exercise per Week: 2 days   Minutes of Exercise per Session: 50 min  Stress: No Stress Concern Present   Feeling of Stress : Not at all  Social Connections: Socially Integrated   Frequency of Communication with Friends and Family: More than three times a week   Frequency of Social Gatherings with Friends and  Family: Once a week   Attends Religious Services: More than 4 times per year   Active Member of [employer] or Organizations: Yes   Attends Music therapist: More than 4 times per year   Marital Status: Married    Additional Social History: grew up with [place], mom had nerve problems or anxiety Currently married, retired Marine scientist, 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, supportive wife Allergies:   Allergies  Allergen Reactions   Dilaudid [Hydromorphone] Other (See Comments)    Respiratory Arrest   Bactrim [Sulfamethoxazole-Trimethoprim] Rash   Benadryl [Diphenhydramine] Other (See Comments)    Muscle spasm    Phenobarbital     Metabolic Disorder Labs: Lab Results  Component Value Date   HGBA1C 6.0 (H) 01/08/2021   MPG 125.5 01/08/2021   No results found for: PROLACTIN Lab Results  Component Value Date   CHOL 131 01/07/2021   TRIG 152 (H) 01/07/2021   HDL 47 01/07/2021   CHOLHDL 2.8 01/07/2021   VLDL 30 01/07/2021   LDLCALC 54 01/07/2021   Lab Results  Component Value Date   TSH 3.000 02/08/2021    Therapeutic Level Labs: No results found for: LITHIUM No results found for: CBMZ No results found for: VALPROATE  Current Medications: Current Outpatient Medications  Medication Sig Dispense Refill   acetaminophen (TYLENOL) 500 MG tablet Take 1,000 mg by mouth every 6 (six) hours as needed for mild pain.     albuterol (VENTOLIN HFA) 108 (90 Base) MCG/ACT inhaler Inhale 2 puffs into the lungs every 4 (four) hours as needed for wheezing or shortness of breath.     allopurinol (ZYLOPRIM) 100 MG tablet Take 100 mg by mouth daily.     aspirin 81 MG EC tablet Take 1 tablet by mouth daily.     atorvastatin (LIPITOR) 80 MG tablet Take 80 mg by mouth daily.     chlorpheniramine-HYDROcodone (TUSSIONEX PENNKINETIC ER) 10-8 MG/5ML SUER Take 5 mLs by mouth every 12 (twelve) hours as needed for cough. 150 mL 0   Cholecalciferol (VITAMIN D-3) 125 MCG (5000 UT) TABS Take 1 tablet by mouth daily.      clopidogrel (PLAVIX) 75 MG tablet Take 75 mg by mouth daily.     escitalopram (LEXAPRO) 20 MG tablet Take 1.5 tablets (30 mg total) by mouth daily. 135 tablet 0   ferrous sulfate 325 (65 FE) MG EC tablet Take 1 tablet (325 mg total) by mouth in the morning and at bedtime. 180 tablet 1   fluticasone (FLONASE) 50 MCG/ACT nasal spray Place 2 sprays into both nostrils daily. 16 g 6   fluticasone (FLOVENT HFA) 110 MCG/ACT inhaler Inhale 1 puff into the lungs daily. 1 each 1   furosemide (LASIX) 20 MG tablet Take 1 tablet (20 mg total) by mouth daily. 30 tablet 1   latanoprost (XALATAN)  0.005 % ophthalmic solution Place 1 drop into both eyes at bedtime.     losartan (COZAAR) 25 MG tablet Take 25 mg by mouth daily as needed (BP >160).     magnesium oxide (MAG-OX) 400 MG tablet Take 400 mg by mouth daily.     memantine (NAMENDA) 10 MG tablet Take 1 tablet (10 mg total) by mouth 2 (two) times daily. 180 tablet 0   metoprolol tartrate (LOPRESSOR) 25 MG tablet Take 12.5 mg by mouth 2 (two) times daily.     nitroGLYCERIN (NITROLINGUAL) 0.4 MG/SPRAY spray Place 1 spray under the tongue every 5 (five) minutes x 3 doses as needed for chest pain.     Oxcarbazepine (TRILEPTAL) 300 MG tablet Take 1 tablet (300 mg total) by mouth daily. 90 tablet 2   pantoprazole (PROTONIX) 40 MG tablet Take 1 tablet (40 mg total) by mouth every evening. 90 tablet 3   potassium citrate (UROCIT-K) 10 MEQ (1080 MG) SR tablet Take 10 mEq by mouth daily.     primidone (MYSOLINE) 50 MG tablet Take 2 tablets (100 mg total) by mouth 2 (two) times daily. 120 tablet 4   QUEtiapine (SEROQUEL) 50 MG tablet Take 2 tablets (100 mg total) by mouth at bedtime. 180 tablet 0   ranolazine (RANEXA) 1000 MG SR tablet Take 1,000 mg by mouth 2 (two) times daily.     rOPINIRole (REQUIP XL) 4 MG 24 hr tablet Take 1 tablet (4 mg total) by mouth at bedtime. 90 tablet 0   triamcinolone ointment (KENALOG) 0.1 % Apply 1 application topically 2 (two) times  daily.     No current facility-administered medications for this visit.    Psychiatric Specialty Exam: Review of Systems  Cardiovascular:  Negative for chest pain.  Psychiatric/Behavioral:  Negative for agitation, behavioral problems, self-injury and suicidal ideas.    Blood pressure 102/70, temperature 97.7 F (36.5 C), height _0  (1.854 m), weight 262 lb (118.8 kg).Body mass index is 34.57 kg/m.  General Appearance: Casual  Eye Contact:  Fair  Speech:  Normal Rate  Volume:  Decreased  Mood: somewhat stressed  Affect:  Constricted  Thought Process:  Goal Directed  Orientation:  Full (Time, Place, and Person)  Thought Content:  Rumination  Suicidal Thoughts:  No  Homicidal Thoughts:  No  Memory:  Immediate;   Fair  Judgement:  Fair  Insight:  Fair  Psychomotor Activity:  Decreased  Concentration:  Concentration: Fair  Recall:  [place] of Knowledge:Good  Language: Good  Akathisia:  No  Handed:    AIMS (if indicated):  has essential tremors on meds. No other inovlunaary movemetns  Assets:  Desire for Improvement Financial Resources/Insurance Housing Social Support  ADL's:  Intact  Cognition: mild decline,  Sleep:   irregular   Screenings: GAD-7    Flowsheet Row Office Visit from 01/26/2021 in [facility] Visit from 12/24/2020 in [facility]  Total GAD-7 Score 14 [place] Office Visit from 02/08/2021 in [facility]  Total Score (max 30 points ) 30      PHQ2-9    [place] Visit from 03/30/2021 in [facility] Office Visit from 03/15/2021 in [place] [facility] Visit from 01/26/2021 in [facility] Visit from 12/24/2020 in [facility] [place]  [place]  PHQ-2 Total Score _0 PHQ-9 Total  Score _1 Flowsheet Row Office Visit from 06/03/2021 in [facility] Office Visit from 03/30/2021 in [facility] ED to Hosp-Admission (Discharged) from 01/07/2021 in [facility]  C-SSRS RISK CATEGORY No Risk No Risk No Risk       Assessment and Plan: as follows  Prior documentation reviewed  Major depressive recurrent moderate; stress related to health, provided supportive tehrapy, continue lexapro 74m add activities of reading or walking to distract Continue seroquel for mood and anxiety and sleep  Adjustment disorder with depression and anxiety; discussed adding activities, contnue lexapro   Anxiety about health: he agrees to hold off ativan for now, will work on adding reading, waslking and keeping memory book Discussed lexapro and it is for anxiety as well.  Time spent in office including face to face 20 minutes  Has lexapro,   Total time spent in office face to face 50 minute including chart review and documentation.  Call for refills Fu2 plus months   NMerian Capron MD 2/2/20234:14 PM

## 2021-06-11 ENCOUNTER — Other Ambulatory Visit: Payer: Self-pay

## 2021-06-11 MED ORDER — FLUTICASONE PROPIONATE HFA 110 MCG/ACT IN AERO: 1.0000 | INHALATION_SPRAY | Freq: Every day | RESPIRATORY_TRACT | 3 refills | Status: DC

## 2021-06-13 ENCOUNTER — Other Ambulatory Visit: Payer: Self-pay | Admitting: Neurology

## 2021-06-15 ENCOUNTER — Other Ambulatory Visit: Payer: Self-pay | Admitting: Neurology

## 2021-06-19 ENCOUNTER — Emergency Department (INDEPENDENT_AMBULATORY_CARE_PROVIDER_SITE_OTHER)
Admission: RE | Admit: 2021-06-19 | Discharge: 2021-06-19 | Disposition: A | Discharge status: Home / Self Care | Payer: Medicare Other | Source: Ambulatory Visit

## 2021-06-19 ENCOUNTER — Other Ambulatory Visit: Payer: Self-pay

## 2021-06-19 VITALS — BP 103/69 | HR 79 | Temp 98.4°F | Resp 18 | Ht 73.0 in | Wt 245.0 lb

## 2021-06-19 DIAGNOSIS — R051 Acute cough: Secondary | ICD-10-CM

## 2021-06-19 MED ORDER — LEVOFLOXACIN 500 MG PO TABS: 500.0000 mg | ORAL_TABLET | Freq: Every day | ORAL | 0 refills | Status: DC

## 2021-06-19 MED ORDER — HYDROCOD POLI-CHLORPHE POLI ER 10-8 MG/5ML PO SUER: 5.0000 mL | Freq: Two times a day (BID) | ORAL | 0 refills | Status: DC | PRN

## 2021-06-19 MED ORDER — PREDNISONE 20 MG PO TABS: 20.0000 mg | ORAL_TABLET | Freq: Two times a day (BID) | ORAL | 0 refills | Status: DC

## 2021-06-19 NOTE — ED Provider Notes (Addendum)
[name]    CSN: [id number] Arrival date & time: 06/19/21  1055      History   Chief Complaint Chief Complaint  Patient presents with   Cough    Cough, nasal congestion, and chest congestion. X2-3 days    HPI Mark Thomas is a 71 y.o. male.   HPI  Mark Thomas had COVID at the end of December and had a coughing and pneumonia symptoms into January.  He still has fatigue from this episode.  He states that the cough had stopped, then he started coughing again 2 or 3 days ago.  He has sinus congestion, postnasal drip, nasal congestion.  He is blowing brown mucus out of his sinus.  He has an extensive history of sinus infections and surgeries.  He also has a history of asthma.  He has increased his asthma inhalers.  He is having an almost constant cough.  He feels like he needs a refill of his previous cough medication.  His wife is sick as well  Past Medical History:  Diagnosis Date   Anxiety    Coronary artery disease    Depression    Essential tremor    GERD (gastroesophageal reflux disease)    High cholesterol    Hypertension    Myocardial infarct (HCC)    Orthostatic hypotension    Stroke [facility])    Vascular dementia [facility])     Patient Active Problem List   Diagnosis Date Noted   COVID-19 04/28/2021   Counseling, unspecified 03/30/2021   Other specified problems related to psychosocial circumstances 03/30/2021   Tinea 03/30/2021   Xerosis cutis 03/30/2021   Stroke ([place]) 02/02/2021   Myocardial infarct ([place]) 02/02/2021   Weakness 01/09/2021   Aortic stenosis 01/08/2021   Vascular dementia ([place]) 01/08/2021   TIA (transient ischemic attack) 01/07/2021   Rotator cuff arthropathy of right shoulder 12/30/2020   Coronary arteriosclerosis 12/24/2020   Asthma 12/24/2020   Chronic alcoholism in remission ([place]) 12/24/2020   Disorder of bursae and tendons in shoulder region 12/24/2020   Gastric ulcer with hemorrhage 12/24/2020   Gout 12/24/2020   Major  depressive disorder, single episode, severe, 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[place]) 12/24/2020   Mitral valve prolapse 12/24/2020   Other and unspecified mycoses 12/24/2020   Chronic gingivitis, non-plaque induced 12/24/2020   Posttraumatic stress disorder 12/24/2020   Paroxysmal atrial fibrillation (HCC) s/p Watchman device 12/24/2020   Essential tremor 12/24/2020   GAD (generalized anxiety disorder) 12/24/2020   Anemia 09/30/2020   Restless legs syndrome (RLS) 09/30/2020   Elevated liver enzymes 05/26/2020   Orthostatic hypotension 10/01/2018   Depression 06/01/2018   Fall 06/01/2018   Gastroesophageal reflux disease without esophagitis 12/13/2017   Insomnia 09/26/2017   CKD (chronic kidney disease) stage 3, GFR 30-59 ml/min (HCC) 09/14/2017   Pacemaker 02/17/2017   History of cardiac radiofrequency ablation (RFA) 12/18/2016   Sinus bradycardia 12/18/2016   Erectile dysfunction 02/12/2013   Essential hypertension 02/12/2013   Other and unspecified hyperlipidemia 02/12/2013   Presence of stent in LAD coronary artery 02/12/2013    Past Surgical History:  Procedure Laterality Date   [place] Medications    Prior to Admission medications   Medication Sig Start Date End Date Taking? Authorizing Provider  acetaminophen (TYLENOL) 500  MG tablet Take 1,000 mg by mouth every 6 (six) hours as needed for mild pain.   Yes [provider]  albuterol (VENTOLIN HFA) 108 (90 Base) MCG/ACT inhaler Inhale 2 puffs into the lungs every 4 (four) hours as needed for wheezing or shortness of breath.   Yes [provider]  allopurinol (ZYLOPRIM) 100 MG tablet Take 100 mg by mouth daily. 09/22/20  Yes [provider]  aspirin 81 MG EC tablet Take 1 tablet by mouth daily. 05/05/14  Yes [provider]  atorvastatin (LIPITOR) 80 MG tablet Take 80 mg by  mouth daily. 11/18/20  Yes [provider]  chlorpheniramine-HYDROcodone (TUSSIONEX PENNKINETIC ER) 10-8 MG/5ML Take 5 mLs by mouth every 12 (twelve) hours as needed for cough. 06/19/21  Yes Raylene Everts, MD  Cholecalciferol (VITAMIN D-3) 125 MCG (5000 UT) TABS Take 1 tablet by mouth daily.   Yes [provider]  clopidogrel (PLAVIX) 75 MG tablet Take 75 mg by mouth daily. 11/18/20  Yes [provider]  escitalopram (LEXAPRO) 20 MG tablet Take 1.5 tablets (30 mg total) by mouth daily. 05/04/21  Yes Merian Capron, MD  ferrous sulfate 325 (65 FE) MG EC tablet Take 1 tablet (325 mg total) by mouth in the morning and at bedtime. 03/19/21 08/30/21 Yes Matthews, Cody, DO  fluticasone (FLONASE) 50 MCG/ACT nasal spray Place 2 sprays into both nostrils daily. 05/07/21  Yes Luetta Nutting, DO  fluticasone (FLOVENT HFA) 110 MCG/ACT inhaler Inhale 1 puff into the lungs daily. 06/11/21  Yes Luetta Nutting, DO  furosemide (LASIX) 20 MG tablet Take 1 tablet (20 mg total) by mouth daily. 03/22/21 06/20/21 Yes Park Liter, MD  latanoprost (XALATAN) 0.005 % ophthalmic solution Place 1 drop into both eyes at bedtime. 12/07/20  Yes [provider]  levofloxacin (LEVAQUIN) 500 MG tablet Take 1 tablet (500 mg total) by mouth daily. 06/19/21  Yes Raylene Everts, MD  losartan (COZAAR) 25 MG tablet Take 25 mg by mouth daily as needed (BP >160). 09/26/20  Yes [provider]  magnesium oxide (MAG-OX) 400 MG tablet Take 400 mg by mouth daily.   Yes [provider]  memantine (NAMENDA) 10 MG tablet Take 1 tablet (10 mg total) by mouth 2 (two) times daily. 05/11/21 08/09/21 Yes Camara, Maryan Puls, MD  metoprolol tartrate (LOPRESSOR) 25 MG tablet Take 12.5 mg by mouth 2 (two) times daily. 11/18/20  Yes [provider]  nitroGLYCERIN (NITROLINGUAL) 0.4 MG/SPRAY spray Place 1 spray under the tongue every 5 (five) minutes x 3 doses as needed for chest pain. 05/05/14  Yes  [provider]  Oxcarbazepine (TRILEPTAL) 300 MG tablet Take 1 tablet (300 mg total) by mouth daily. 06/01/21 08/30/21 Yes Camara, Maryan Puls, MD  pantoprazole (PROTONIX) 40 MG tablet Take 1 tablet (40 mg total) by mouth every evening. 05/04/21  Yes Luetta Nutting, DO  potassium citrate (UROCIT-K) 10 MEQ (1080 MG) SR tablet Take 10 mEq by mouth daily. 09/22/20  Yes [provider]  predniSONE (DELTASONE) 20 MG tablet Take 1 tablet (20 mg total) by mouth 2 (two) times daily with a meal. 06/19/21  Yes Raylene Everts, MD  primidone (MYSOLINE) 50 MG tablet Take 2 tablets (100 mg total) by mouth 2 (two) times daily. 05/10/21 08/08/21 Yes Camara, Maryan Puls, MD  ranolazine (RANEXA) 1000 MG SR tablet Take 1,000 mg by mouth 2 (two) times daily. 11/18/20  Yes [provider]  rOPINIRole (REQUIP XL) 4 MG 24 hr tablet Take 1 tablet (  4 mg total) by mouth at bedtime. 05/27/21 08/25/21 Yes Camara, Maryan Puls, MD  triamcinolone ointment (KENALOG) 0.1 % Apply 1 application topically 2 (two) times daily. 01/21/21  Yes [provider]  QUEtiapine (SEROQUEL) 50 MG tablet Take 2 tablets (100 mg total) by mouth at bedtime. 05/07/21 06/06/21  Merian Capron, MD    Family History Family History  Problem Relation Age of Onset   Hypertension Mother    Stroke Mother    Stroke Father    Hypertension Father     Social History Social History   Tobacco Use   Smoking status: Never    Passive exposure: Never   Smokeless tobacco: Never  Vaping Use   Vaping Use: Never used  Substance Use Topics   Alcohol use: Not Currently   Drug use: Never     Allergies   Dilaudid [hydromorphone], Bactrim [sulfamethoxazole-trimethoprim], Benadryl [diphenhydramine], and Phenobarbital   Review of Systems Review of Systems See HPI  Physical Exam Triage Vital Signs ED Triage Vitals  Enc Vitals Group     BP 06/19/21 1110 103/69     Pulse Rate 06/19/21 1110 79     Resp 06/19/21 1110 18     Temp 06/19/21 1110  98.4 F (36.9 C)     Temp Source 06/19/21 1110 Oral     SpO2 06/19/21 1110 96 %     Weight 06/19/21 1107 245 lb (111.1 kg)     Height 06/19/21 1107 _0  (1.854 m)     Head Circumference --      Peak Flow --      Pain Score 06/19/21 1107 0     Pain Loc --      Pain Edu? --      Excl. in [place]? --    No data found.  Updated Vital Signs BP 103/69 (BP Location: Left Arm)    Pulse 79    Temp 98.4 F (36.9 C) (Oral)    Resp 18    Ht _1  (1.854 m)    Wt 111.1 kg    SpO2 96%    BMI 32.32 kg/m      Physical Exam Constitutional:      General: He is not in acute distress.    Appearance: He is well-developed. He is obese.  HENT:     Head: Normocephalic and atraumatic.     Right Ear: Tympanic membrane and ear canal normal.     Left Ear: Tympanic membrane and ear canal normal.     Nose: Congestion and rhinorrhea present.     Mouth/Throat:     Pharynx: Posterior oropharyngeal erythema present.     Comments: The maxillary sinuses are tender.  Nasal is congested with red membranes and swelling.  Yellow crusting.  Posterior pharynx has moderate erythema.  There is scarring across forehead from old ethmoid surgery. Eyes:     Conjunctiva/sclera: Conjunctivae normal.     Pupils: Pupils are equal, round, and reactive to light.  Cardiovascular:     Rate and Rhythm: Normal rate and regular rhythm.     Heart sounds: Murmur heard.     Comments: Harsh systolic Pulmonary:     Effort: Pulmonary effort is normal. No respiratory distress.     Breath sounds: Wheezing present.     Comments: Few wheezes centrally with cough.  No rales or rhonchi Abdominal:     General: There is no distension.     Palpations: Abdomen is soft.  Musculoskeletal:  General: Normal range of motion.     Cervical back: Normal range of motion.  Lymphadenopathy:     Cervical: No cervical adenopathy.  Skin:    General: Skin is warm and dry.  Neurological:     Mental Status: He is alert.  Psychiatric:     Comments:  Emotional lability     UC Treatments / Results  Labs (all labs ordered are listed, but only abnormal results are displayed) Labs Reviewed - No data to display  EKG   Radiology No results found.  Procedures Procedures (including critical care time)  Medications Ordered in UC Medications - No data to display  Initial Impression / Assessment and Plan / UC Course  I have reviewed the triage vital signs and the nursing notes.  Pertinent labs & imaging results that were available during my care of the patient were reviewed by me and considered in my medical decision making (see chart for details).     Even those patients Has only had symptoms for 3 days, with his extensive history and recent illness I feel it is prudent to cover him with an antibiotic.  Follow-up with PCP Final Clinical Impressions(s) / UC Diagnoses   Final diagnoses:  Acute cough     Discharge Instructions      Increase water intake Use a humidifier Levaquin 1 x  a day for 7 days Prednisone 2 x a day for 5 days Take 2 doses today Cough medicine as needed   ED Prescriptions     Medication Sig Dispense Auth. Provider   levofloxacin (LEVAQUIN) 500 MG tablet Take 1 tablet (500 mg total) by mouth daily. 7 tablet Raylene Everts, MD   predniSONE (DELTASONE) 20 MG tablet Take 1 tablet (20 mg total) by mouth 2 (two) times daily with a meal. 10 tablet Raylene Everts, MD   chlorpheniramine-HYDROcodone [facility] PENNKINETIC ER) 10-8 MG/5ML Take 5 mLs by mouth every 12 (twelve) hours as needed for cough. 150 mL Raylene Everts, MD      PDMP not reviewed this encounter.   Raylene Everts, MD 06/19/21 1247    Raylene Everts, MD 06/19/21 [phone]

## 2021-06-19 NOTE — ED Triage Notes (Signed)
Pt states that he has a cough, nasal congestion, and chest congestion. X2-3 days   Pt states that he is vaccinated for covid. Pt states that he has had flu vaccine.

## 2021-06-19 NOTE — Discharge Instructions (Signed)
Increase water intake Use a humidifier Levaquin 1 x  a day for 7 days Prednisone 2 x a day for 5 days Take 2 doses today Cough medicine as needed

## 2021-06-22 ENCOUNTER — Other Ambulatory Visit: Payer: Self-pay

## 2021-06-22 ENCOUNTER — Ambulatory Visit (INDEPENDENT_AMBULATORY_CARE_PROVIDER_SITE_OTHER): Payer: Medicare Other | Admitting: Family Medicine

## 2021-06-22 ENCOUNTER — Encounter: Payer: Self-pay | Admitting: Family Medicine

## 2021-06-22 ENCOUNTER — Ambulatory Visit (INDEPENDENT_AMBULATORY_CARE_PROVIDER_SITE_OTHER): Payer: Medicare Other

## 2021-06-22 VITALS — BP 107/66 | HR 82 | Resp 20 | Ht 73.0 in | Wt 267.0 lb

## 2021-06-22 DIAGNOSIS — R051 Acute cough: Secondary | ICD-10-CM

## 2021-06-22 DIAGNOSIS — R0602 Shortness of breath: Secondary | ICD-10-CM | POA: Diagnosis not present

## 2021-06-22 DIAGNOSIS — R053 Chronic cough: Secondary | ICD-10-CM

## 2021-06-22 IMAGING — CT CT CHEST W/O CM
2 of 4 series · 15 of 36 positions shown, 18 images · non-contrast
Comparison: None.

CLINICAL DATA: Short of breath.  Chronic cough



[Series 2: thorax · axial · 0.80mm/px · z∈[+1268,+1530]mm · 12 of 155 slices shown, 15 images]
[im 12/155  mediastinal]
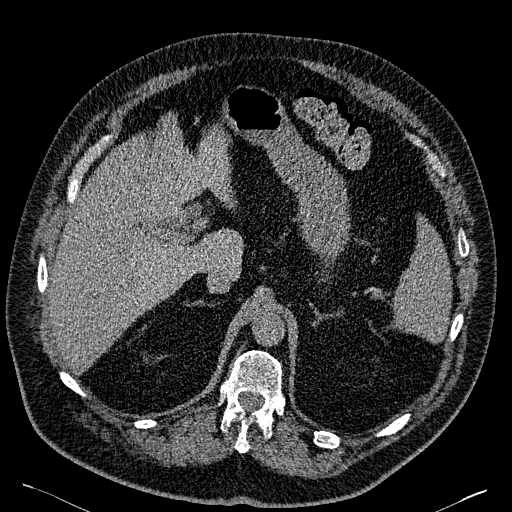
[im 12/155  lung]
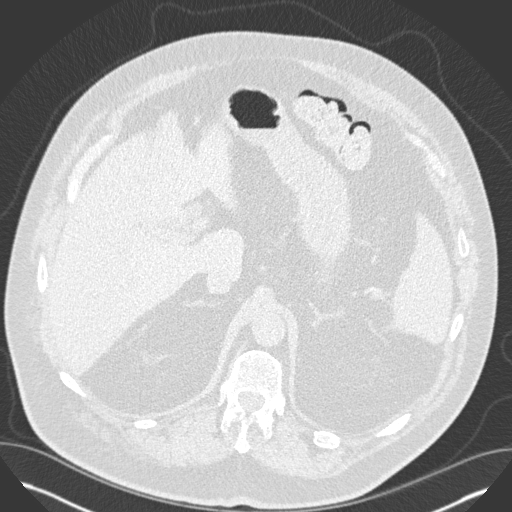
[im 24/155  lung]
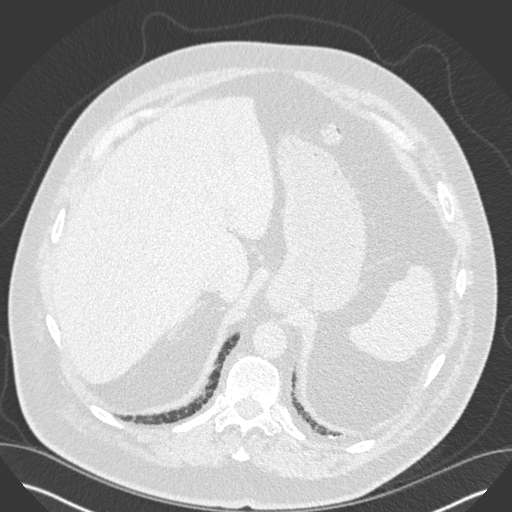
[im 36/155  lung]
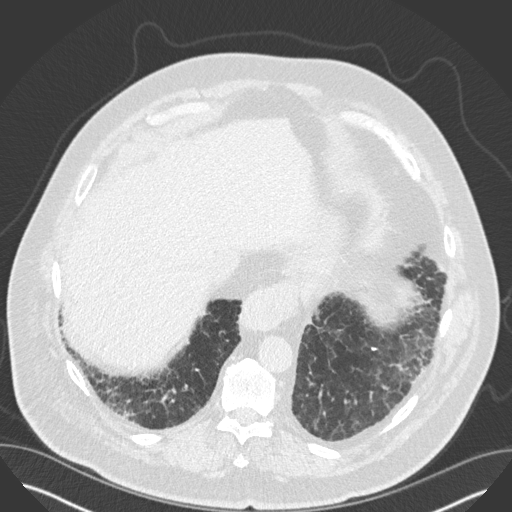
[im 48/155  lung]
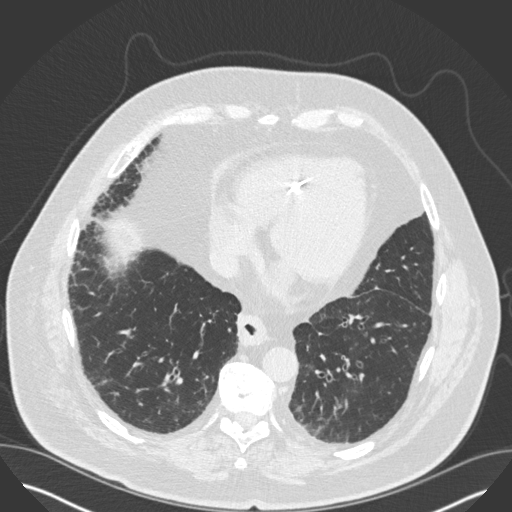
[im 60/155  mediastinal]
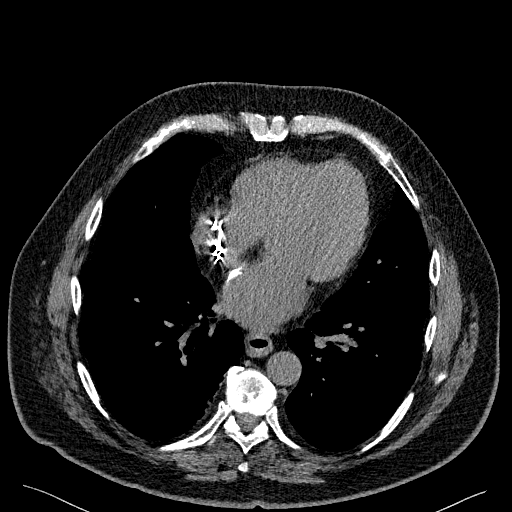
[im 60/155  lung]
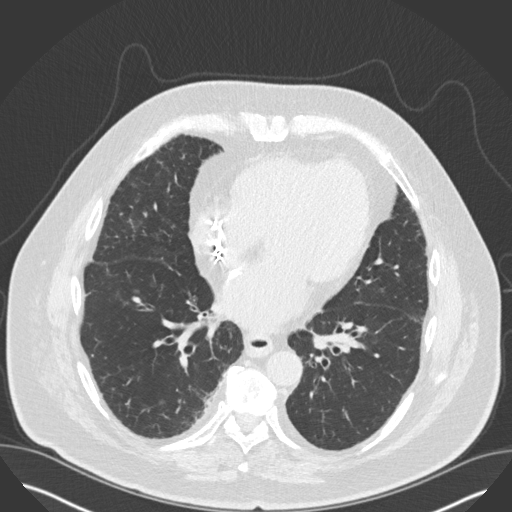
[im 72/155  lung]
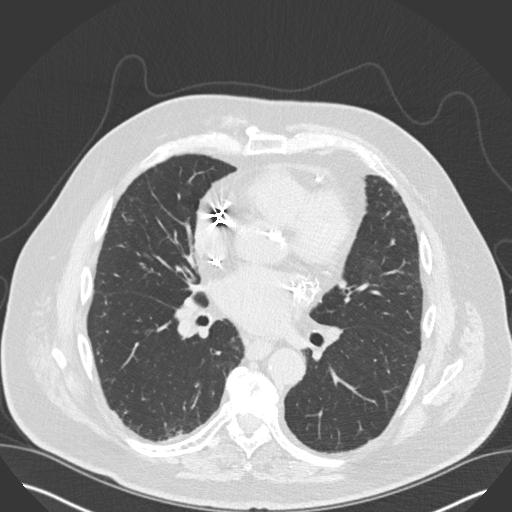
[im 83/155  lung]
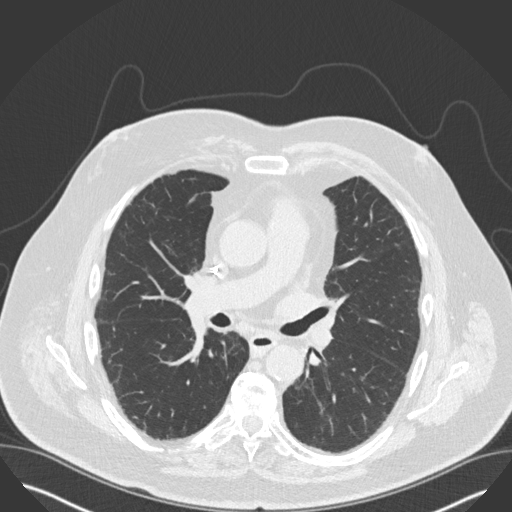
[im 95/155  lung]
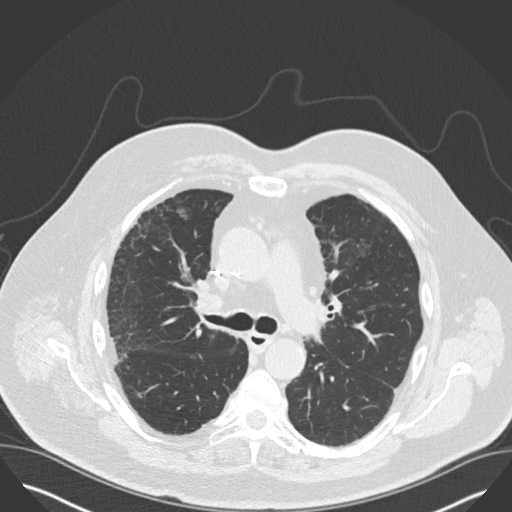
[im 107/155  mediastinal]
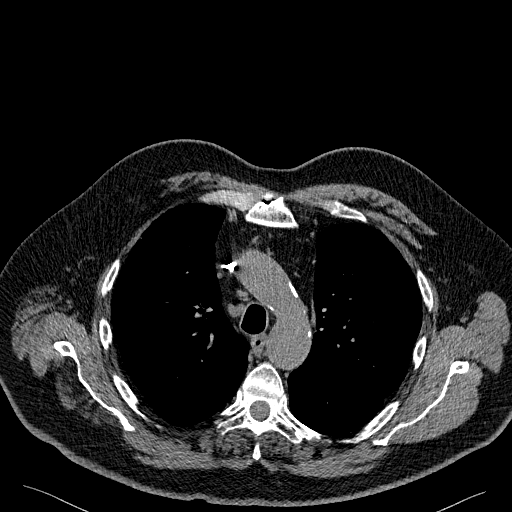
[im 107/155  lung]
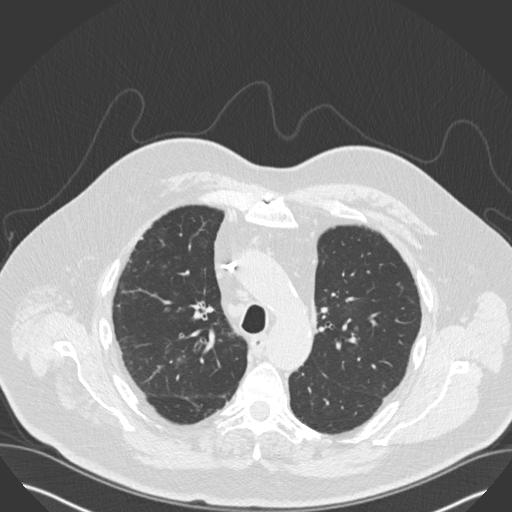
[im 119/155  lung]
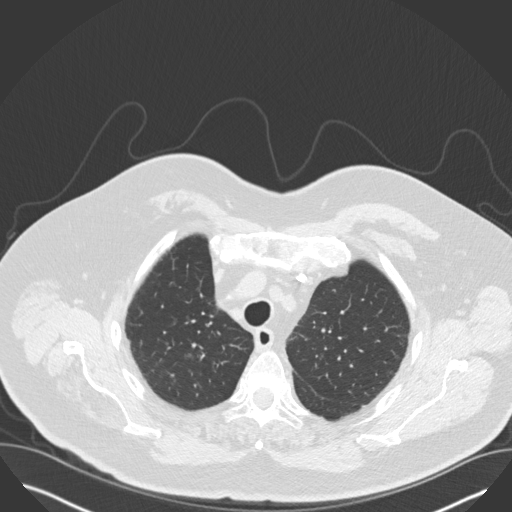
[im 131/155  lung]
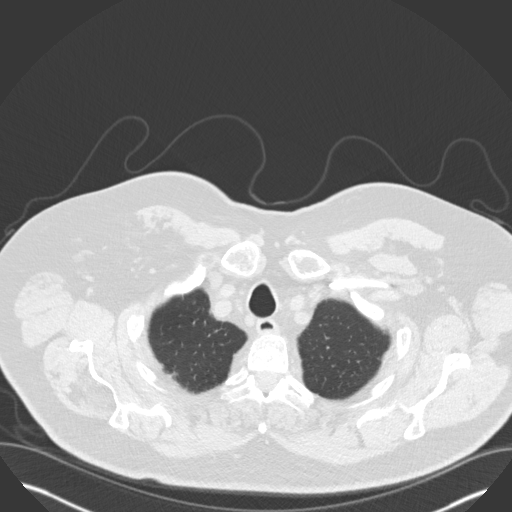
[im 143/155  lung]
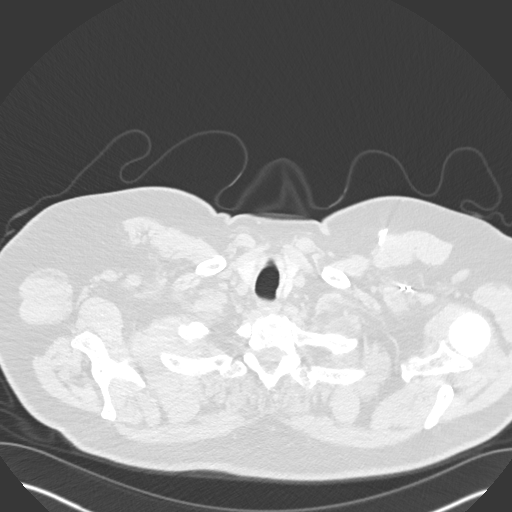

[Series 5: coronal · coronal · 0.64mm/px · 3 of 152 slices shown]
[im 31/152  lung]
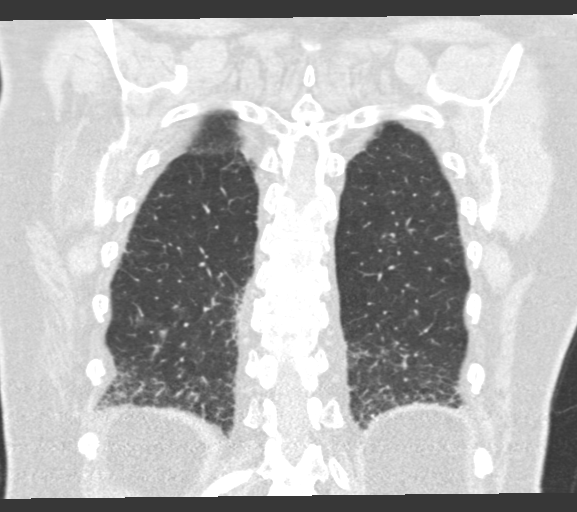
[im 61/152  lung]
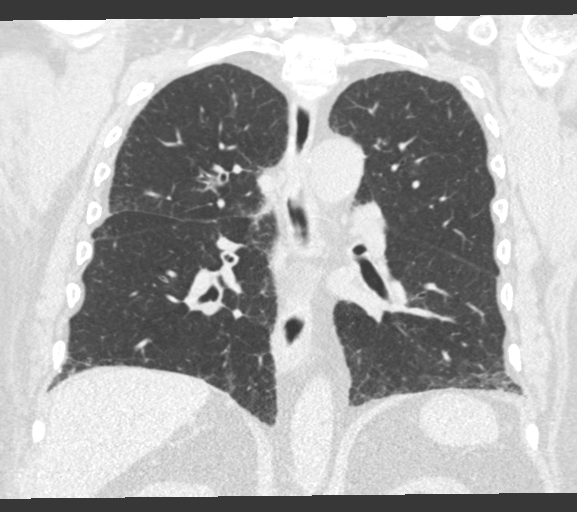
[im 91/152  lung]
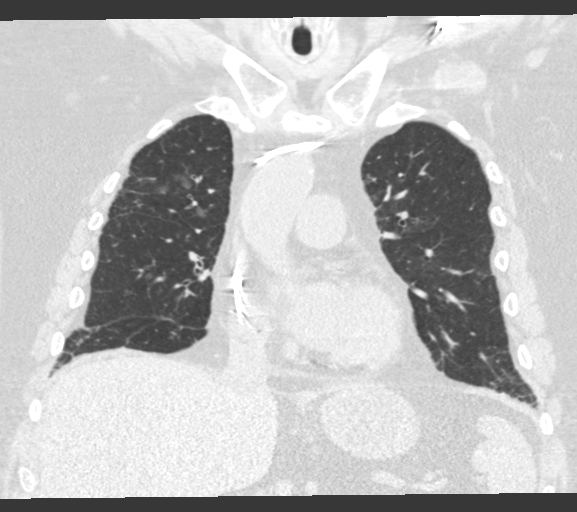

[15 of 36 positions shown; findings below may reference images not displayed]

FINDINGS: Cardiovascular: LEFT chest wall pacer with RIGHT heart leads. Aortic
valve repair

Mediastinum/Nodes: Lobular paratracheal lymph nodes are present on
the RIGHT. For example 13 mm node on image 56/2 at the RIGHT lower
paratracheal nodal station. 7 mm high RIGHT paratracheal node (image
[DATE]). No hilar adenopathy.

Lungs/Pleura: Patchy ground-glass nodules in the RIGHT upper lobe.
Lungs are hyperinflated. Mild interstitial reticular pattern in the
lower lobes. No honeycombing. No bronchiectasis.

Upper Abdomen: Limited view of the liver, kidneys, pancreas are
unremarkable. Normal adrenal glands. Simple fluid attenuation cyst
of the RIGHT kidney. Adrenal glands normal.

Musculoskeletal: No aggressive osseous lesion.
IMPRESSION: 1. Patchy ground-glass nodules in the RIGHT upper lobe are favored
pulmonary infection.
2. Irregular RIGHT paratracheal lymph nodes favor reactive.
Recommend follow-up CT in 3 months to demonstrate resolution of
ground-glass nodules and evaluate adenopathy.
3. Mild interstitial lung disease at the lung bases.

## 2021-06-22 MED ORDER — METHYLPREDNISOLONE ACETATE 80 MG/ML IJ SUSP
80.0000 mg | Freq: Once | INTRAMUSCULAR | Status: AC
  Administered 2021-06-22: 80 mg via INTRAMUSCULAR

## 2021-06-22 MED ORDER — AZITHROMYCIN 250 MG PO TABS: ORAL_TABLET | ORAL | 0 refills | Status: AC

## 2021-06-22 NOTE — Progress Notes (Signed)
Acute Office Visit  Subjective:    Patient ID: Mark Thomas, male    DOB: 1950/12/18, 71 y.o.   MRN: [id number]  Chief Complaint  Patient presents with   Cough    Productive cough, nasal congestion and chest discomfort. Currently taking Levaquin and flonase.     HPI Patient is in today for worsening cough.  He became sick in December the week before Christmas.  He was diagnosed with COVID.  He was struggling to improve at that time so came in on January 4.  He had already been on antibiotics at that point but was switched to doxycycline.  He was also given some cough syrup and given 80 mg of methylprednisolone.  No prior history of significant pulmonary disease though he says he has mild asthma that flares from time to time.  He said he maybe had a week where his cough got better but then started feeling sick again probably about 10 days ago with cough and nasal congestion.  He went to urgent care on February 18 and was started on Levaquin.  He was also given a prescription for prednisone but does not tolerate oral prednisone so did not start it.  His wife brought him in today because he actually feels worse.  He did take his cough medicine every 6 hours just overnight.  He pretty complicated cardiovascular history.  And is under frequent care with cardiology.  He is here today with his wife.  She has been sick as well but feels like she is recovering.  Past Medical History:  Diagnosis Date   Anxiety    Coronary artery disease    Depression    Essential tremor    GERD (gastroesophageal reflux disease)    High cholesterol    Hypertension    Myocardial infarct (HCC)    Orthostatic hypotension    Stroke ([place])    Vascular dementia ([place])     Past Surgical History:  Procedure Laterality Date   APPENDECTOMY     CARDIAC SURGERY     CHOLECYSTECTOMY     HERNIA REPAIR     NASAL SINUS SURGERY     SHOULDER SURGERY      Family History  Problem Relation Age of Onset   Hypertension  Mother    Stroke Mother    Stroke Father    Hypertension Father     Social History   Socioeconomic History   Marital status: Married    Spouse name: Debra Calabretta   Number of children: 2   Years of education: 20   Highest education level: Master's degree (e.g., MA, MS, MEng, MEd, MSW, MBA)  Occupational History   Occupation: Retired  Tobacco Use   Smoking status: Never    Passive exposure: Never   Smokeless tobacco: Never  Vaping Use   Vaping Use: Never used  Substance and Sexual Activity   Alcohol use: Not Currently   Drug use: Never   Sexual activity: Not on file  Other Topics Concern   Not on file  Social History Narrative   Lives with his wife. He has vascular dementia. His wife is his primary caretaker.He enjoys watching football and spending time with his grandchildren.   Social Determinants of Health   Financial Resource Strain: Low Risk    Difficulty of Paying Living Expenses: Not hard at all  Food Insecurity: No Food Insecurity   Worried About Charity fundraiser in the Last Year: Never true   Ran Out of  Food in the Last Year: Never true  Transportation Needs: No Transportation Needs   Lack of Transportation (Medical): No   Lack of Transportation (Non-Medical): No  Physical Activity: Insufficiently Active   Days of Exercise per Week: 2 days   Minutes of Exercise per Session: 50 min  Stress: No Stress Concern Present   Feeling of Stress : Not at all  Social Connections: Socially Integrated   Frequency of Communication with Friends and Family: More than three times a week   Frequency of Social Gatherings with Friends and Family: Once a week   Attends Religious Services: More than 4 times per year   Active Member of [employer] or Organizations: Yes   Attends Music therapist: More than 4 times per year   Marital Status: Married  Human resources officer Violence: Not At Risk   Fear of Current or Ex-Partner: No   Emotionally Abused: No   Physically  Abused: No   Sexually Abused: No    Outpatient Medications Prior to Visit  Medication Sig Dispense Refill   acetaminophen (TYLENOL) 500 MG tablet Take 1,000 mg by mouth every 6 (six) hours as needed for mild pain.     albuterol (VENTOLIN HFA) 108 (90 Base) MCG/ACT inhaler Inhale 2 puffs into the lungs every 4 (four) hours as needed for wheezing or shortness of breath.     allopurinol (ZYLOPRIM) 100 MG tablet Take 100 mg by mouth daily.     aspirin 81 MG EC tablet Take 1 tablet by mouth daily.     atorvastatin (LIPITOR) 80 MG tablet Take 80 mg by mouth daily.     chlorpheniramine-HYDROcodone (TUSSIONEX PENNKINETIC ER) 10-8 MG/5ML Take 5 mLs by mouth every 12 (twelve) hours as needed for cough. 150 mL 0   Cholecalciferol (VITAMIN D-3) 125 MCG (5000 UT) TABS Take 1 tablet by mouth daily.     clopidogrel (PLAVIX) 75 MG tablet Take 75 mg by mouth daily.     escitalopram (LEXAPRO) 20 MG tablet Take 1.5 tablets (30 mg total) by mouth daily. 135 tablet 0   ferrous sulfate 325 (65 FE) MG EC tablet Take 1 tablet (325 mg total) by mouth in the morning and at bedtime. 180 tablet 1   fluticasone (FLONASE) 50 MCG/ACT nasal spray Place 2 sprays into both nostrils daily. 16 g 6   fluticasone (FLOVENT HFA) 110 MCG/ACT inhaler Inhale 1 puff into the lungs daily. 1 each 3   latanoprost (XALATAN) 0.005 % ophthalmic solution Place 1 drop into both eyes at bedtime.     levofloxacin (LEVAQUIN) 500 MG tablet Take 1 tablet (500 mg total) by mouth daily. 7 tablet 0   losartan (COZAAR) 25 MG tablet Take 25 mg by mouth daily as needed (BP >160).     magnesium oxide (MAG-OX) 400 MG tablet Take 400 mg by mouth daily.     memantine (NAMENDA) 10 MG tablet Take 1 tablet (10 mg total) by mouth 2 (two) times daily. 180 tablet 0   metoprolol tartrate (LOPRESSOR) 25 MG tablet Take 12.5 mg by mouth 2 (two) times daily.     nitroGLYCERIN (NITROLINGUAL) 0.4 MG/SPRAY spray Place 1 spray under the tongue every 5 (five) minutes x 3  doses as needed for chest pain.     Oxcarbazepine (TRILEPTAL) 300 MG tablet Take 1 tablet (300 mg total) by mouth daily. 90 tablet 2   pantoprazole (PROTONIX) 40 MG tablet Take 1 tablet (40 mg total) by mouth every evening. 90 tablet 3   potassium  citrate (UROCIT-K) 10 MEQ (1080 MG) SR tablet Take 10 mEq by mouth daily.     primidone (MYSOLINE) 50 MG tablet Take 2 tablets (100 mg total) by mouth 2 (two) times daily. 120 tablet 4   ranolazine (RANEXA) 1000 MG SR tablet Take 1,000 mg by mouth 2 (two) times daily.     rOPINIRole (REQUIP XL) 4 MG 24 hr tablet Take 1 tablet (4 mg total) by mouth at bedtime. 90 tablet 0   triamcinolone ointment (KENALOG) 0.1 % Apply 1 application topically 2 (two) times daily.     predniSONE (DELTASONE) 20 MG tablet Take 1 tablet (20 mg total) by mouth 2 (two) times daily with a meal. 10 tablet 0   furosemide (LASIX) 20 MG tablet Take 1 tablet (20 mg total) by mouth daily. 30 tablet 1   QUEtiapine (SEROQUEL) 50 MG tablet Take 2 tablets (100 mg total) by mouth at bedtime. 180 tablet 0   No facility-administered medications prior to visit.    Allergies  Allergen Reactions   Dilaudid [Hydromorphone] Other (See Comments)    Respiratory Arrest   Bactrim [Sulfamethoxazole-Trimethoprim] Rash   Benadryl [Diphenhydramine] Other (See Comments)    Muscle spasm    Phenobarbital     Review of Systems     Objective:    Physical Exam Constitutional:      Appearance: He is well-developed.  HENT:     Head: Normocephalic and atraumatic.     Right Ear: External ear normal.     Left Ear: External ear normal.     Nose: Nose normal.  Eyes:     Conjunctiva/sclera: Conjunctivae normal.     Pupils: Pupils are equal, round, and reactive to light.  Neck:     Thyroid: No thyromegaly.  Cardiovascular:     Rate and Rhythm: Normal rate.     Heart sounds: Murmur heard.     Comments: Very high-pitched 4 out of 6 systolic ejection murmur. Pulmonary:     Effort: Pulmonary  effort is normal.     Breath sounds: Normal breath sounds.     Comments: Crackles at the bases bilaterally. Musculoskeletal:     Cervical back: Neck supple.  Lymphadenopathy:     Cervical: No cervical adenopathy.  Skin:    General: Skin is warm and dry.  Neurological:     Mental Status: He is alert and oriented to person, place, and time.    BP 107/66    Pulse 82    Resp 20    Ht _0  (1.854 m)    Wt 267 lb (121.1 kg)    SpO2 96%    BMI 35.23 kg/m  Wt Readings from Last 3 Encounters:  06/22/21 267 lb (121.1 kg)  06/19/21 245 lb (111.1 kg)  05/05/21 262 lb (118.8 kg)    Health Maintenance Due  Topic Date Due   Zoster Vaccines- Shingrix (1 of 2) Never done   COVID-19 Vaccine (4 - Booster for Moderna series) 06/11/2020    There are no preventive care reminders to display for this patient.   Lab Results  Component Value Date   TSH 3.000 02/08/2021   Lab Results  Component Value Date   WBC 6.9 01/08/2021   HGB 12.8 (L) 01/08/2021   HCT 38.5 (L) 01/08/2021   MCV 95.1 01/08/2021   PLT 190 01/08/2021   Lab Results  Component Value Date   NA 137 03/29/2021   K 4.2 03/29/2021   CO2 21 03/29/2021   GLUCOSE 116 (  H) 03/29/2021   BUN 23 03/29/2021   CREATININE 1.41 (H) 03/29/2021   BILITOT 0.6 01/08/2021   ALKPHOS 65 01/08/2021   AST 53 (H) 01/08/2021   ALT 40 01/08/2021   PROT 6.3 (L) 01/08/2021   ALBUMIN 3.4 (L) 01/08/2021   CALCIUM 9.3 03/29/2021   ANIONGAP 10 01/08/2021   EGFR 54 (L) 03/29/2021   Lab Results  Component Value Date   CHOL 131 01/07/2021   Lab Results  Component Value Date   HDL 47 01/07/2021   Lab Results  Component Value Date   LDLCALC 54 01/07/2021   Lab Results  Component Value Date   TRIG 152 (H) 01/07/2021   Lab Results  Component Value Date   CHOLHDL 2.8 01/07/2021   Lab Results  Component Value Date   HGBA1C 6.0 (H) 01/08/2021       Assessment & Plan:   Problem List Items Addressed This Visit   None Visit Diagnoses      Acute cough    -  Primary   Relevant Medications   methylPREDNISolone acetate (DEPO-MEDROL) injection 80 mg (Completed)   Other Relevant Orders   CT Chest Wo Contrast   SOB (shortness of breath)       Relevant Medications   methylPREDNISolone acetate (DEPO-MEDROL) injection 80 mg (Completed)   Other Relevant Orders   CT Chest Wo Contrast      It sounds like he has had back-to-back illnesses and really has to struggle to get past it.  Last chest x-ray did show some possible scar tissue at the bottom of the lungs I am hearing crackles so I do not know if this is chronic or if it may be a sign of early pneumonia again.  We can go ahead and move forward with a CT of the chest for further evaluation at this point.  Encouraged him to continue with his Levaquin.  Did add azithromycin to cover for atypicals.  He does not tolerate oral prednisone so we gave him a Depo-Medrol 80 mg shot today which she does tolerate well and discussed that it will usually last about 5 days and then wear off but it by then we should have a new care plan based on his CT results.  Meds ordered this encounter  Medications   azithromycin (ZITHROMAX) 250 MG tablet    Sig: Take 2 tablets on day 1, then 1 tablet daily on days 2 through 5    Dispense:  6 tablet    Refill:  0   methylPREDNISolone acetate (DEPO-MEDROL) injection 80 mg     Beatrice Lecher, MD

## 2021-06-22 NOTE — Progress Notes (Signed)
Please call Mark Thomas and his wife.  They did see a patchy area in the right upper lobe of the lung.  They felt like it could be consistent with infection so I am glad that we have added the azithromycin please start that today.  They saw a few swollen lymph nodes as well but they feel like that is probably reacting to the infection.  They did recommend a CT in 3 months though to follow-up on that patchy area as well as the swollen lymph nodes.  You do have what looks like some mild interstitial lung disease at the bases which is probably what was we were seeing on the chest x-ray.  You can follow-up with Dr. Zigmund Daniel about this once you are feeling better.

## 2021-06-23 ENCOUNTER — Telehealth: Payer: Self-pay | Admitting: Cardiology

## 2021-06-23 NOTE — Telephone Encounter (Signed)
Patient Name: Mark Thomas  DOB: 12-15-1950 MRN: [id number]  Primary Cardiologist: Jenne Campus, MD  Chart reviewed as part of pre-operative protocol coverage. The patient has an upcoming visit scheduled with Dr. Agustin Cree on 08/05/2021, at which time clearance can be addressed in case there are any issues that would impact surgical recommendations.  Total shoulder replacement is not scheduled until 08/17/2021 as below. I added preop FYI to appointment note so that provider is aware to address at time of outpatient visit. Per office protocol the cardiology provider should forward their finalize clearance decision and recommendations regarding antiplatelet therapy to requesting party below.    I will route this message as FYI to requesting party and remove this message from preop box as separate preop APP input not needed at this time.  Please call with any questions.  Lenna Sciara, NP 06/23/2021, 12:35 PM

## 2021-06-23 NOTE — Telephone Encounter (Signed)
° ° ° °  Pre-operative Risk Assessment    Patient Name: Mark Thomas  DOB: 1951/01/07 MRN: [id number]      Request for Surgical Clearance    Procedure:   total shoulder replacement  Date of Surgery:  Clearance 08/17/21                                 Surgeon:  Dr. Martinique Case Surgeon's Group or Practice Name:  [facility] Phone number:  [phone] Fax number:  [phone]   Type of Clearance Requested:   - Medical  - Pharmacy:  Hold Clopidogrel (Plavix) 7 days   Type of Anesthesia:  General    Additional requests/questions:    Signed, Selinda Orion   06/23/2021, 11:39 AM

## 2021-06-24 ENCOUNTER — Ambulatory Visit: Payer: TRICARE For Life (TFL) | Admitting: Cardiology

## 2021-06-25 ENCOUNTER — Other Ambulatory Visit: Payer: Self-pay

## 2021-06-25 ENCOUNTER — Emergency Department (HOSPITAL_BASED_OUTPATIENT_CLINIC_OR_DEPARTMENT_OTHER)
Admission: EM | Admit: 2021-06-25 | Discharge: 2021-06-25 | Disposition: A | Discharge status: Home / Self Care | Payer: Medicare Other | Attending: Emergency Medicine | Admitting: Emergency Medicine

## 2021-06-25 ENCOUNTER — Encounter (HOSPITAL_BASED_OUTPATIENT_CLINIC_OR_DEPARTMENT_OTHER): Payer: Self-pay | Admitting: *Deleted

## 2021-06-25 ENCOUNTER — Telehealth: Payer: Self-pay

## 2021-06-25 ENCOUNTER — Ambulatory Visit: Payer: TRICARE For Life (TFL) | Admitting: Family Medicine

## 2021-06-25 DIAGNOSIS — J4541 Moderate persistent asthma with (acute) exacerbation: Secondary | ICD-10-CM | POA: Diagnosis not present

## 2021-06-25 DIAGNOSIS — Z79899 Other long term (current) drug therapy: Secondary | ICD-10-CM | POA: Diagnosis not present

## 2021-06-25 DIAGNOSIS — I2510 Atherosclerotic heart disease of native coronary artery without angina pectoris: Secondary | ICD-10-CM | POA: Insufficient documentation

## 2021-06-25 DIAGNOSIS — J181 Lobar pneumonia, unspecified organism: Secondary | ICD-10-CM | POA: Insufficient documentation

## 2021-06-25 DIAGNOSIS — I10 Essential (primary) hypertension: Secondary | ICD-10-CM | POA: Insufficient documentation

## 2021-06-25 DIAGNOSIS — R062 Wheezing: Secondary | ICD-10-CM

## 2021-06-25 DIAGNOSIS — J189 Pneumonia, unspecified organism: Secondary | ICD-10-CM

## 2021-06-25 DIAGNOSIS — F0390 Unspecified dementia without behavioral disturbance: Secondary | ICD-10-CM | POA: Diagnosis not present

## 2021-06-25 DIAGNOSIS — Z20822 Contact with and (suspected) exposure to covid-19: Secondary | ICD-10-CM | POA: Insufficient documentation

## 2021-06-25 DIAGNOSIS — Z7982 Long term (current) use of aspirin: Secondary | ICD-10-CM | POA: Insufficient documentation

## 2021-06-25 DIAGNOSIS — R059 Cough, unspecified: Secondary | ICD-10-CM | POA: Diagnosis present

## 2021-06-25 LAB — URINALYSIS, ROUTINE W REFLEX MICROSCOPIC
Bilirubin Urine: NEGATIVE
Glucose, UA: NEGATIVE mg/dL
Hgb urine dipstick: NEGATIVE
Ketones, ur: NEGATIVE mg/dL
Leukocytes,Ua: NEGATIVE
Nitrite: NEGATIVE
Specific Gravity, Urine: 1.015 (ref 1.005–1.030)
pH: 6.5 (ref 5.0–8.0)

## 2021-06-25 LAB — LACTIC ACID, PLASMA: Lactic Acid, Venous: 1.8 mmol/L (ref 0.5–1.9)

## 2021-06-25 LAB — COMPREHENSIVE METABOLIC PANEL
ALT: 63 U/L — ABNORMAL HIGH (ref 0–44)
AST: 47 U/L — ABNORMAL HIGH (ref 15–41)
Albumin: 4 g/dL (ref 3.5–5.0)
Alkaline Phosphatase: 64 U/L (ref 38–126)
Anion gap: 10 (ref 5–15)
BUN: 22 mg/dL (ref 8–23)
CO2: 26 mmol/L (ref 22–32)
Calcium: 9.1 mg/dL (ref 8.9–10.3)
Chloride: 96 mmol/L — ABNORMAL LOW (ref 98–111)
Creatinine, Ser: 1.2 mg/dL (ref 0.61–1.24)
GFR, Estimated: >60 mL/min (ref >60)
Glucose, Bld: 128 mg/dL — ABNORMAL HIGH (ref 70–99)
Potassium: 4.2 mmol/L (ref 3.5–5.1)
Sodium: 132 mmol/L — ABNORMAL LOW (ref 135–145)
Total Bilirubin: 0.8 mg/dL (ref 0.3–1.2)
Total Protein: 6.9 g/dL (ref 6.5–8.1)

## 2021-06-25 LAB — CBC WITH DIFFERENTIAL/PLATELET
Abs Immature Granulocytes: 0.04 10*3/uL (ref 0.00–0.07)
Basophils Absolute: 0 10*3/uL (ref 0.0–0.1)
Basophils Relative: 0 %
Eosinophils Absolute: 0 10*3/uL (ref 0.0–0.5)
Eosinophils Relative: 0 %
HCT: 39 % (ref 39.0–52.0)
Hemoglobin: 12.8 g/dL — ABNORMAL LOW (ref 13.0–17.0)
Immature Granulocytes: 1 %
Lymphocytes Relative: 15 %
Lymphs Abs: 1.1 10*3/uL (ref 0.7–4.0)
MCH: 33 pg (ref 26.0–34.0)
MCHC: 32.8 g/dL (ref 30.0–36.0)
MCV: 100.5 fL — ABNORMAL HIGH (ref 80.0–100.0)
Monocytes Absolute: 0.8 10*3/uL (ref 0.1–1.0)
Monocytes Relative: 11 %
Neutro Abs: 5.1 10*3/uL (ref 1.7–7.7)
Neutrophils Relative %: 73 %
Platelets: 164 10*3/uL (ref 150–400)
RBC: 3.88 MIL/uL — ABNORMAL LOW (ref 4.22–5.81)
RDW: 14.1 % (ref 11.5–15.5)
WBC: 7 10*3/uL (ref 4.0–10.5)
nRBC: 0 % (ref 0.0–0.2)

## 2021-06-25 LAB — RESP PANEL BY RT-PCR (FLU A&B, COVID) ARPGX2
Influenza A by PCR: NEGATIVE
Influenza B by PCR: NEGATIVE
SARS Coronavirus 2 by RT PCR: NEGATIVE

## 2021-06-25 MED ORDER — IPRATROPIUM-ALBUTEROL 0.5-2.5 (3) MG/3ML IN SOLN
3.0000 mL | Freq: Once | RESPIRATORY_TRACT | Status: AC
  Administered 2021-06-25: 3 mL via RESPIRATORY_TRACT
  Filled 2021-06-25: qty 3

## 2021-06-25 MED ORDER — AMBULATORY NON FORMULARY MEDICATION: 0 refills | Status: DC

## 2021-06-25 MED ORDER — ALBUTEROL SULFATE (2.5 MG/3ML) 0.083% IN NEBU
2.5000 mg | INHALATION_SOLUTION | Freq: Once | RESPIRATORY_TRACT | Status: AC
  Administered 2021-06-25: 2.5 mg via RESPIRATORY_TRACT
  Filled 2021-06-25: qty 3

## 2021-06-25 MED ORDER — SODIUM CHLORIDE 0.9 % IV SOLN
500.0000 mg | Freq: Once | INTRAVENOUS | Status: AC
  Administered 2021-06-25: 500 mg via INTRAVENOUS
  Filled 2021-06-25: qty 5

## 2021-06-25 MED ORDER — DEXAMETHASONE SODIUM PHOSPHATE 10 MG/ML IJ SOLN
10.0000 mg | Freq: Once | INTRAMUSCULAR | Status: AC
Start: 2021-06-25 — End: 2021-06-25
  Administered 2021-06-25: 10 mg via INTRAVENOUS
  Filled 2021-06-25: qty 1

## 2021-06-25 MED ORDER — LACTATED RINGERS IV BOLUS: 1000.0000 mL | Freq: Once | INTRAVENOUS | Status: DC

## 2021-06-25 MED ORDER — HYDROCOD POLI-CHLORPHE POLI ER 10-8 MG/5ML PO SUER: 5.0000 mL | Freq: Two times a day (BID) | ORAL | 0 refills | Status: DC | PRN

## 2021-06-25 MED ORDER — ALBUTEROL SULFATE (2.5 MG/3ML) 0.083% IN NEBU
2.5000 mg | INHALATION_SOLUTION | Freq: Once | RESPIRATORY_TRACT | Status: AC
  Administered 2021-06-25: 2.5 mg via RESPIRATORY_TRACT

## 2021-06-25 MED ORDER — SODIUM CHLORIDE 0.9 % IV BOLUS
1000.0000 mL | Freq: Once | INTRAVENOUS | Status: AC
  Administered 2021-06-25: 1000 mL via INTRAVENOUS

## 2021-06-25 MED ORDER — ACETAMINOPHEN 500 MG PO TABS
1000.0000 mg | ORAL_TABLET | Freq: Once | ORAL | Status: AC
Start: 2021-06-25 — End: 2021-06-25
  Administered 2021-06-25: 1000 mg via ORAL
  Filled 2021-06-25: qty 2

## 2021-06-25 MED ORDER — ALBUTEROL SULFATE (2.5 MG/3ML) 0.083% IN NEBU: 5.0000 mg | INHALATION_SOLUTION | Freq: Once | RESPIRATORY_TRACT | Status: DC

## 2021-06-25 MED ORDER — SODIUM CHLORIDE 0.9 % IV SOLN
1.0000 g | Freq: Once | INTRAVENOUS | Status: AC
  Administered 2021-06-25: 1 g via INTRAVENOUS
  Filled 2021-06-25: qty 10

## 2021-06-25 MED ORDER — ALBUTEROL SULFATE (2.5 MG/3ML) 0.083% IN NEBU: 2.5000 mg | INHALATION_SOLUTION | Freq: Four times a day (QID) | RESPIRATORY_TRACT | 12 refills | Status: DC | PRN

## 2021-06-25 MED ORDER — MAGNESIUM SULFATE 2 GM/50ML IV SOLN
2.0000 g | Freq: Once | INTRAVENOUS | Status: AC
  Administered 2021-06-25: 2 g via INTRAVENOUS
  Filled 2021-06-25: qty 50

## 2021-06-25 NOTE — ED Provider Notes (Signed)
Signout from Dr. Maryan Rued.  71 year old male here with continued cough congestion shortness of breath.  Started over a week ago.  He was put on Levaquin and prednisone.  Did not take the prednisone because not tolerated in the past.  CT done 3 days ago showed probable pneumonia and started on Zithromax.  Using inhalers without improvement.  Generally weak.  Wheezing on exam.  Plan is to follow-up symptoms after ceftriaxone fluids and IV magnesium.  Discharge versus admit for symptom control. Physical Exam  BP 110/74    Pulse 75    Temp 100.3 F (37.9 C) (Oral)    Resp 16    SpO2 98%   Physical Exam  Procedures  Procedures  ED Course / MDM    Medical Decision Making Amount and/or Complexity of Data Reviewed Labs: ordered.  Risk OTC drugs. Prescription drug management.   Patient finished up his antibiotics and magnesium.  He says he feels better and oxygen saturation has been okay.  He does not wish to be admitted to the hospital.  They were able to get the nebulizer unit from primary care doctor so we will provide prescription for albuterol.  Recommended close follow-up with PCP.  Return instructions discussed       Hayden Rasmussen, MD 06/26/21 [phone]

## 2021-06-25 NOTE — Addendum Note (Signed)
Addended by: Narda Rutherford on: 06/25/2021 03:51 PM   Modules accepted: Orders

## 2021-06-25 NOTE — ED Provider Notes (Signed)
[facility] DEPT Provider Note   CSN: [id number] Arrival date & time: 06/25/21  1257     History  Chief Complaint  Patient presents with   Pneumonia    Mark Thomas is a 71 y.o. male.  Patient is a 71 year old male with significant medical history for stroke, vascular dementia, CAD, prior MI, hypertension, GERD and asthma who is presenting today due to worsening cough, congestion and shortness of breath.  Approximately 8 days ago he started to develop cough and congestion.  His wife reported that 6 days ago they went to urgent care because he was having mostly upper nasal congestion and cough and at that time they diagnosed him with a cold and gave him a prescription for Levaquin and prednisone.  She reported that he started the Levaquin but they did not fill the prednisone because he does not seem to tolerate it very well at home.  He continued to have gradually worsening symptoms and they saw his doctor on Tuesday.  At that time she ordered a CAT scan and gave him a shot of Decadron and started him on azithromycin.  He has continued to use his inhalers at home with very little improvement.  They called about the CAT scan reporting that he did have pneumonia.  Over the last 24 hours his wife reports he has become worse.  He is now generally weak, short of breath and cannot walk across the room with his walker without getting winded.  He has also been more confused than normal.  He denies any chest pain or lower extremity swelling.  He does complain of feeling short of breath and feels the coughing is getting worse.  The history is provided by the spouse, the patient and medical records.  Pneumonia      Home Medications Prior to Admission medications   Medication Sig Start Date End Date Taking? Authorizing Provider  acetaminophen (TYLENOL) 500 MG tablet Take 1,000 mg by mouth every 6 (six) hours as needed for mild pain.    [provider]  albuterol  (VENTOLIN HFA) 108 (90 Base) MCG/ACT inhaler Inhale 2 puffs into the lungs every 4 (four) hours as needed for wheezing or shortness of breath.    [provider]  allopurinol (ZYLOPRIM) 100 MG tablet Take 100 mg by mouth daily. 09/22/20   [provider]  aspirin 81 MG EC tablet Take 1 tablet by mouth daily. 05/05/14   [provider]  atorvastatin (LIPITOR) 80 MG tablet Take 80 mg by mouth daily. 11/18/20   [provider]  azithromycin (ZITHROMAX) 250 MG tablet Take 2 tablets on day 1, then 1 tablet daily on days 2 through 5 06/22/21 06/27/21  Hali Marry, MD  chlorpheniramine-HYDROcodone (TUSSIONEX PENNKINETIC ER) 10-8 MG/5ML Take 5 mLs by mouth every 12 (twelve) hours as needed for cough. 06/19/21   Raylene Everts, MD  Cholecalciferol (VITAMIN D-3) 125 MCG (5000 UT) TABS Take 1 tablet by mouth daily.    [provider]  clopidogrel (PLAVIX) 75 MG tablet Take 75 mg by mouth daily. 11/18/20   [provider]  escitalopram (LEXAPRO) 20 MG tablet Take 1.5 tablets (30 mg total) by mouth daily. 05/04/21   Merian Capron, MD  ferrous sulfate 325 (65 FE) MG EC tablet Take 1 tablet (325 mg total) by mouth in the morning and at bedtime. 03/19/21 08/30/21  Luetta Nutting, DO  fluticasone (FLONASE) 50 MCG/ACT nasal spray Place 2 sprays into both nostrils daily. 05/07/21  Luetta Nutting, DO  fluticasone (FLOVENT HFA) 110 MCG/ACT inhaler Inhale 1 puff into the lungs daily. 06/11/21   Luetta Nutting, DO  furosemide (LASIX) 20 MG tablet Take 1 tablet (20 mg total) by mouth daily. 03/22/21 06/20/21  Park Liter, MD  latanoprost (XALATAN) 0.005 % ophthalmic solution Place 1 drop into both eyes at bedtime. 12/07/20   [provider]  levofloxacin (LEVAQUIN) 500 MG tablet Take 1 tablet (500 mg total) by mouth daily. 06/19/21   Raylene Everts, MD  losartan (COZAAR) 25 MG tablet Take 25 mg by mouth daily as needed (BP >160). 09/26/20   [provider]  magnesium oxide (MAG-OX) 400 MG tablet Take 400 mg by mouth daily.    [provider]  memantine (NAMENDA) 10 MG tablet Take 1 tablet (10 mg total) by mouth 2 (two) times daily. 05/11/21 08/09/21  Alric Ran, MD  metoprolol tartrate (LOPRESSOR) 25 MG tablet Take 12.5 mg by mouth 2 (two) times daily. 11/18/20   [provider]  nitroGLYCERIN (NITROLINGUAL) 0.4 MG/SPRAY spray Place 1 spray under the tongue every 5 (five) minutes x 3 doses as needed for chest pain. 05/05/14   [provider]  Oxcarbazepine (TRILEPTAL) 300 MG tablet Take 1 tablet (300 mg total) by mouth daily. 06/01/21 08/30/21  Alric Ran, MD  pantoprazole (PROTONIX) 40 MG tablet Take 1 tablet (40 mg total) by mouth every evening. 05/04/21   Luetta Nutting, DO  potassium citrate (UROCIT-K) 10 MEQ (1080 MG) SR tablet Take 10 mEq by mouth daily. 09/22/20   [provider]  primidone (MYSOLINE) 50 MG tablet Take 2 tablets (100 mg total) by mouth 2 (two) times daily. 05/10/21 08/08/21  Alric Ran, MD  QUEtiapine (SEROQUEL) 50 MG tablet Take 2 tablets (100 mg total) by mouth at bedtime. 05/07/21 06/06/21  Merian Capron, MD  ranolazine (RANEXA) 1000 MG SR tablet Take 1,000 mg by mouth 2 (two) times daily. 11/18/20   [provider]  rOPINIRole (REQUIP XL) 4 MG 24 hr tablet Take 1 tablet (4 mg total) by mouth at bedtime. 05/27/21 08/25/21  Alric Ran, MD  triamcinolone ointment (KENALOG) 0.1 % Apply 1 application topically 2 (two) times daily. 01/21/21   [provider]      Allergies    Dilaudid [hydromorphone], Bactrim [sulfamethoxazole-trimethoprim], Benadryl [diphenhydramine], and Phenobarbital    Review of Systems   Review of Systems  Physical Exam Updated Vital Signs BP 135/70    Pulse 81    Temp 100.3 F (37.9 C) (Oral)    Resp (!) 21    SpO2 98%  Physical Exam Vitals and nursing note reviewed.  Constitutional:      General: He is not in acute distress.     Appearance: He is well-developed.  HENT:     Head: Normocephalic and atraumatic.     Right Ear: Tympanic membrane normal.     Left Ear: Tympanic membrane normal.     Nose: Nose normal.     Mouth/Throat:     Mouth: Mucous membranes are dry.  Eyes:     Conjunctiva/sclera: Conjunctivae normal.     Pupils: Pupils are equal, round, and reactive to light.  Cardiovascular:     Rate and Rhythm: Normal rate and regular rhythm.     Pulses: Normal pulses.     Heart sounds: No murmur heard. Pulmonary:     Effort: Pulmonary effort is normal. Tachypnea present. No respiratory distress.     Breath sounds: Decreased air movement  present. Wheezing present. No rales.  Abdominal:     General: There is no distension.     Palpations: Abdomen is soft.     Tenderness: There is no abdominal tenderness. There is no guarding or rebound.  Musculoskeletal:        General: No tenderness. Normal range of motion.     Cervical back: Normal range of motion and neck supple.     Right lower leg: No edema.     Left lower leg: No edema.  Skin:    General: Skin is warm and dry.     Findings: No erythema or rash.  Neurological:     Mental Status: He is alert and oriented to person, place, and time. Mental status is at baseline.  Psychiatric:        Mood and Affect: Mood normal.        Behavior: Behavior normal.    ED Results / Procedures / Treatments   Labs (all labs ordered are listed, but only abnormal results are displayed) Labs Reviewed  CBC WITH DIFFERENTIAL/PLATELET - Abnormal; Notable for the following components:      Result Value   RBC 3.88 (*)    Hemoglobin 12.8 (*)    MCV 100.5 (*)    All other components within normal limits  RESP PANEL BY RT-PCR (FLU A&B, COVID) ARPGX2  URINALYSIS, ROUTINE W REFLEX MICROSCOPIC  COMPREHENSIVE METABOLIC PANEL  LACTIC ACID, PLASMA    EKG EKG Interpretation  Date/Time:  Friday June 25 2021 13:10:19 EST Ventricular Rate:  82 PR Interval:  180 QRS  Duration: 88 QT Interval:  386 QTC Calculation: 451 R Axis:   49 Text Interpretation: Sinus rhythm Abnormal R-wave progression, early transition No significant change since last tracing Confirmed by Blanchie Dessert [phone]) on 06/25/2021 1:14:29 PM  Radiology No results found.  Procedures Procedures    Medications Ordered in ED Medications  magnesium sulfate IVPB 2 g 50 mL (2 g Intravenous New Bag/Given 06/25/21 1418)  cefTRIAXone (ROCEPHIN) 1 g in sodium chloride 0.9 % 100 mL IVPB (has no administration in time range)  azithromycin (ZITHROMAX) 500 mg in sodium chloride 0.9 % 250 mL IVPB (has no administration in time range)  dexamethasone (DECADRON) injection 10 mg (10 mg Intravenous Given 06/25/21 1414)  ipratropium-albuterol (DUONEB) 0.5-2.5 (3) MG/3ML nebulizer solution 3 mL (3 mLs Nebulization Given 06/25/21 1342)  albuterol (PROVENTIL) (2.5 MG/3ML) 0.083% nebulizer solution 2.5 mg (2.5 mg Nebulization Given 06/25/21 1342)    ED Course/ Medical Decision Making/ A&P                           Medical Decision Making Amount and/or Complexity of Data Reviewed External Data Reviewed: radiology and notes. Labs: ordered. Decision-making details documented in ED Course. ECG/medicine tests: ordered and independent interpretation performed. Decision-making details documented in ED Course.  Risk OTC drugs. Prescription drug management. Drug therapy requiring intensive monitoring for toxicity. Decision regarding hospitalization.   Elderly male with multiple medical problems and comorbidities is presenting today with worsening cough and shortness of breath.  Patient had a CT scan of his chest done 2 days ago based on evaluation of external medical records from his recent PCP and urgent care visit.  CT scan showed evidence of pneumonia in the right upper lobe with reactive lymphadenopathy.  Patient has completed a course of Levaquin and had started azithromycin 2 days ago.  His symptoms  however are worsening.  He is wheezing diffusely  on exam and has very tight airways.  Sats are stable on room air.  He is febrile here to 100.3 and with persistent symptoms that are worsening, CT findings and failed outpatient antibiotics feel that patient meets admission criteria.  Findings discussed with the patient and his wife.  They are comfortable with this plan.  Patient received Decadron as he reports he tolerates that better, magnesium, albuterol and Atrovent here.  He was given a dose of Rocephin and azithromycin for pneumonia.  I independently interpreted EKG and labs.  EKG today without acute changes.  COVID and flu are negative  CBC, CMP, lactic acid, UA wnl.   Findings discussed with the patient and his wife.  Discussed that feel that patient would benefit from admission however he reports he does not desire to be admitted today.  Patient's wife and son are present and they her concern for him and desire for him to be admitted however patient appears awake alert and able to understand that he could potentially get worse with worsening respiratory status that could lead to intubation, hospitalization for a longer period of time and morbidity but patient reports that he understands that and he still wants to go home.  He is amenable to getting therapy here which she is still receiving.  He is going to get a second neb treatment.  Then we will attempt to ambulate the patient to ensure he is able to do that without significant desaturation and to ensure his wife is able to care for him at home.  Patient reports if we do not let him go home he will sign out AMA.        Final Clinical Impression(s) / ED Diagnoses Final diagnoses:  Community acquired pneumonia of right upper lobe of lung  Moderate persistent asthma with exacerbation    Rx / DC Orders ED Discharge Orders     None         Blanchie Dessert, MD 06/25/21 [phone]

## 2021-06-25 NOTE — Discharge Instructions (Signed)
You were seen in the emergency department for increased cough and shortness of breath.  you received medications and IV antibiotics along with steroids.  You were offered admission and declined this.  Please restart your antibiotics and finish them tomorrow.  Albuterol nebulizer treatments every 6 hours as needed.  Follow-up with your primary care doctor.  Return to the emergency department if any worsening or concerning symptoms

## 2021-06-25 NOTE — ED Triage Notes (Signed)
Pt has had URI and cough since  around 2/15. Pt was seen 2/18 and was seen again 2/21 and dx with pneumonia at that time.  He was placed on antibiotics.  Pt has gotten sicker and continues to have sob and spoke with PCP who advised wife to being him to ED.

## 2021-06-25 NOTE — ED Notes (Signed)
ED Provider at bedside.

## 2021-06-25 NOTE — Telephone Encounter (Signed)
Patient's wife called to notify the patient was at the [facility] ED and refused to be admitted. The treating provider at the ED suggested the patient go home and have nebulizer treatment. She contacted [place] to have his PCP write him a prescription for the nebulizer. The treating provider could send a prescription for the medication just not the machine.   Since it is late afternoon on a Friday, we are letting the patient borrow one the clinic's nebulizer until we can get them one. The patient's wife understood and is having her sister come by and pick up the machine. She stated she knows how to use the machine as well.

## 2021-06-25 NOTE — ED Notes (Signed)
EMT-P provided AVS using Teachback Method. Patient verbalizes understanding of Discharge Instructions. Opportunity for Questioning and Answers were provided by EMT-P. Patient Discharged from ED.  ? ?

## 2021-06-28 ENCOUNTER — Telehealth: Payer: Self-pay | Admitting: General Practice

## 2021-06-28 NOTE — Telephone Encounter (Signed)
Transition Care Management Follow-up Telephone Call Date of discharge and from where: [facility] from [facility]. How have you been since you were released from the hospital? Not doing well. Using his albuterol every six hours. Wife understands that he needs to be in the hospital as it was recommended by the MD at the hospital. They have a closing on a home tomorrow and they will go back to ER after that. Advised patient to go to the ER sooner if shortness of breath, chest pain and difficulty breathing. Any questions or concerns? No  Items Reviewed: Did the pt receive and understand the discharge instructions provided? Yes  Medications obtained and verified? No  Other? No  Any new allergies since your discharge? No  Dietary orders reviewed? Yes Do you have support at home? Yes   Home Care and Equipment/Supplies: Were home health services ordered? no  Functional Questionnaire: (I = Independent and D = Dependent) ADLs: I  Bathing/Dressing- I  Meal Prep- I  Eating- I  Maintaining continence- I  Transferring/Ambulation- I  Managing Meds- I  Follow up appointments reviewed:  PCP Hospital f/u appt confirmed? No   Specialist Hospital f/u appt confirmed? No   Are transportation arrangements needed? No  If their condition worsens, is the pt aware to call PCP or go to the Emergency Dept.? Yes Was the patient provided with contact information for the PCP's office or ED? Yes Was to pt encouraged to call back with questions or concerns? Yes

## 2021-06-30 ENCOUNTER — Other Ambulatory Visit: Payer: Self-pay

## 2021-06-30 ENCOUNTER — Emergency Department (HOSPITAL_BASED_OUTPATIENT_CLINIC_OR_DEPARTMENT_OTHER)
Admission: EM | Admit: 2021-06-30 | Discharge: 2021-06-30 | Disposition: A | Discharge status: Home / Self Care | Payer: Medicare Other | Attending: Emergency Medicine | Admitting: Emergency Medicine

## 2021-06-30 ENCOUNTER — Encounter (HOSPITAL_BASED_OUTPATIENT_CLINIC_OR_DEPARTMENT_OTHER): Payer: Self-pay | Admitting: Pediatrics

## 2021-06-30 ENCOUNTER — Emergency Department (HOSPITAL_BASED_OUTPATIENT_CLINIC_OR_DEPARTMENT_OTHER): Payer: Medicare Other | Admitting: Radiology

## 2021-06-30 ENCOUNTER — Other Ambulatory Visit (HOSPITAL_BASED_OUTPATIENT_CLINIC_OR_DEPARTMENT_OTHER): Payer: Self-pay

## 2021-06-30 DIAGNOSIS — R051 Acute cough: Secondary | ICD-10-CM | POA: Diagnosis not present

## 2021-06-30 DIAGNOSIS — Z7982 Long term (current) use of aspirin: Secondary | ICD-10-CM | POA: Insufficient documentation

## 2021-06-30 DIAGNOSIS — R062 Wheezing: Secondary | ICD-10-CM | POA: Insufficient documentation

## 2021-06-30 DIAGNOSIS — Z20822 Contact with and (suspected) exposure to covid-19: Secondary | ICD-10-CM | POA: Diagnosis not present

## 2021-06-30 DIAGNOSIS — R059 Cough, unspecified: Secondary | ICD-10-CM | POA: Diagnosis present

## 2021-06-30 DIAGNOSIS — Z79899 Other long term (current) drug therapy: Secondary | ICD-10-CM | POA: Diagnosis not present

## 2021-06-30 LAB — CBC
HCT: 41.8 % (ref 39.0–52.0)
Hemoglobin: 13.6 g/dL (ref 13.0–17.0)
MCH: 32.8 pg (ref 26.0–34.0)
MCHC: 32.5 g/dL (ref 30.0–36.0)
MCV: 100.7 fL — ABNORMAL HIGH (ref 80.0–100.0)
Platelets: 232 10*3/uL (ref 150–400)
RBC: 4.15 MIL/uL — ABNORMAL LOW (ref 4.22–5.81)
RDW: 13.7 % (ref 11.5–15.5)
WBC: 8.4 10*3/uL (ref 4.0–10.5)
nRBC: 0 % (ref 0.0–0.2)

## 2021-06-30 LAB — BASIC METABOLIC PANEL
Anion gap: 9 (ref 5–15)
BUN: 16 mg/dL (ref 8–23)
CO2: 27 mmol/L (ref 22–32)
Calcium: 9.5 mg/dL (ref 8.9–10.3)
Chloride: 100 mmol/L (ref 98–111)
Creatinine, Ser: 1.19 mg/dL (ref 0.61–1.24)
GFR, Estimated: >60 mL/min (ref >60)
Glucose, Bld: 114 mg/dL — ABNORMAL HIGH (ref 70–99)
Potassium: 4.4 mmol/L (ref 3.5–5.1)
Sodium: 136 mmol/L (ref 135–145)

## 2021-06-30 LAB — RESP PANEL BY RT-PCR (FLU A&B, COVID) ARPGX2
Influenza A by PCR: NEGATIVE
Influenza B by PCR: NEGATIVE
SARS Coronavirus 2 by RT PCR: NEGATIVE

## 2021-06-30 LAB — TROPONIN I (HIGH SENSITIVITY)
Troponin I (High Sensitivity): 10 ng/L (ref <18)
Troponin I (High Sensitivity): 9 ng/L (ref <18)

## 2021-06-30 IMAGING — DX DG CHEST 2V
2 series · 2 of 2 positions shown · non-contrast
Comparison: [DATE]

CLINICAL DATA: Cough, fever

EXAM:
CHEST - 2 VIEW

[chest pa]
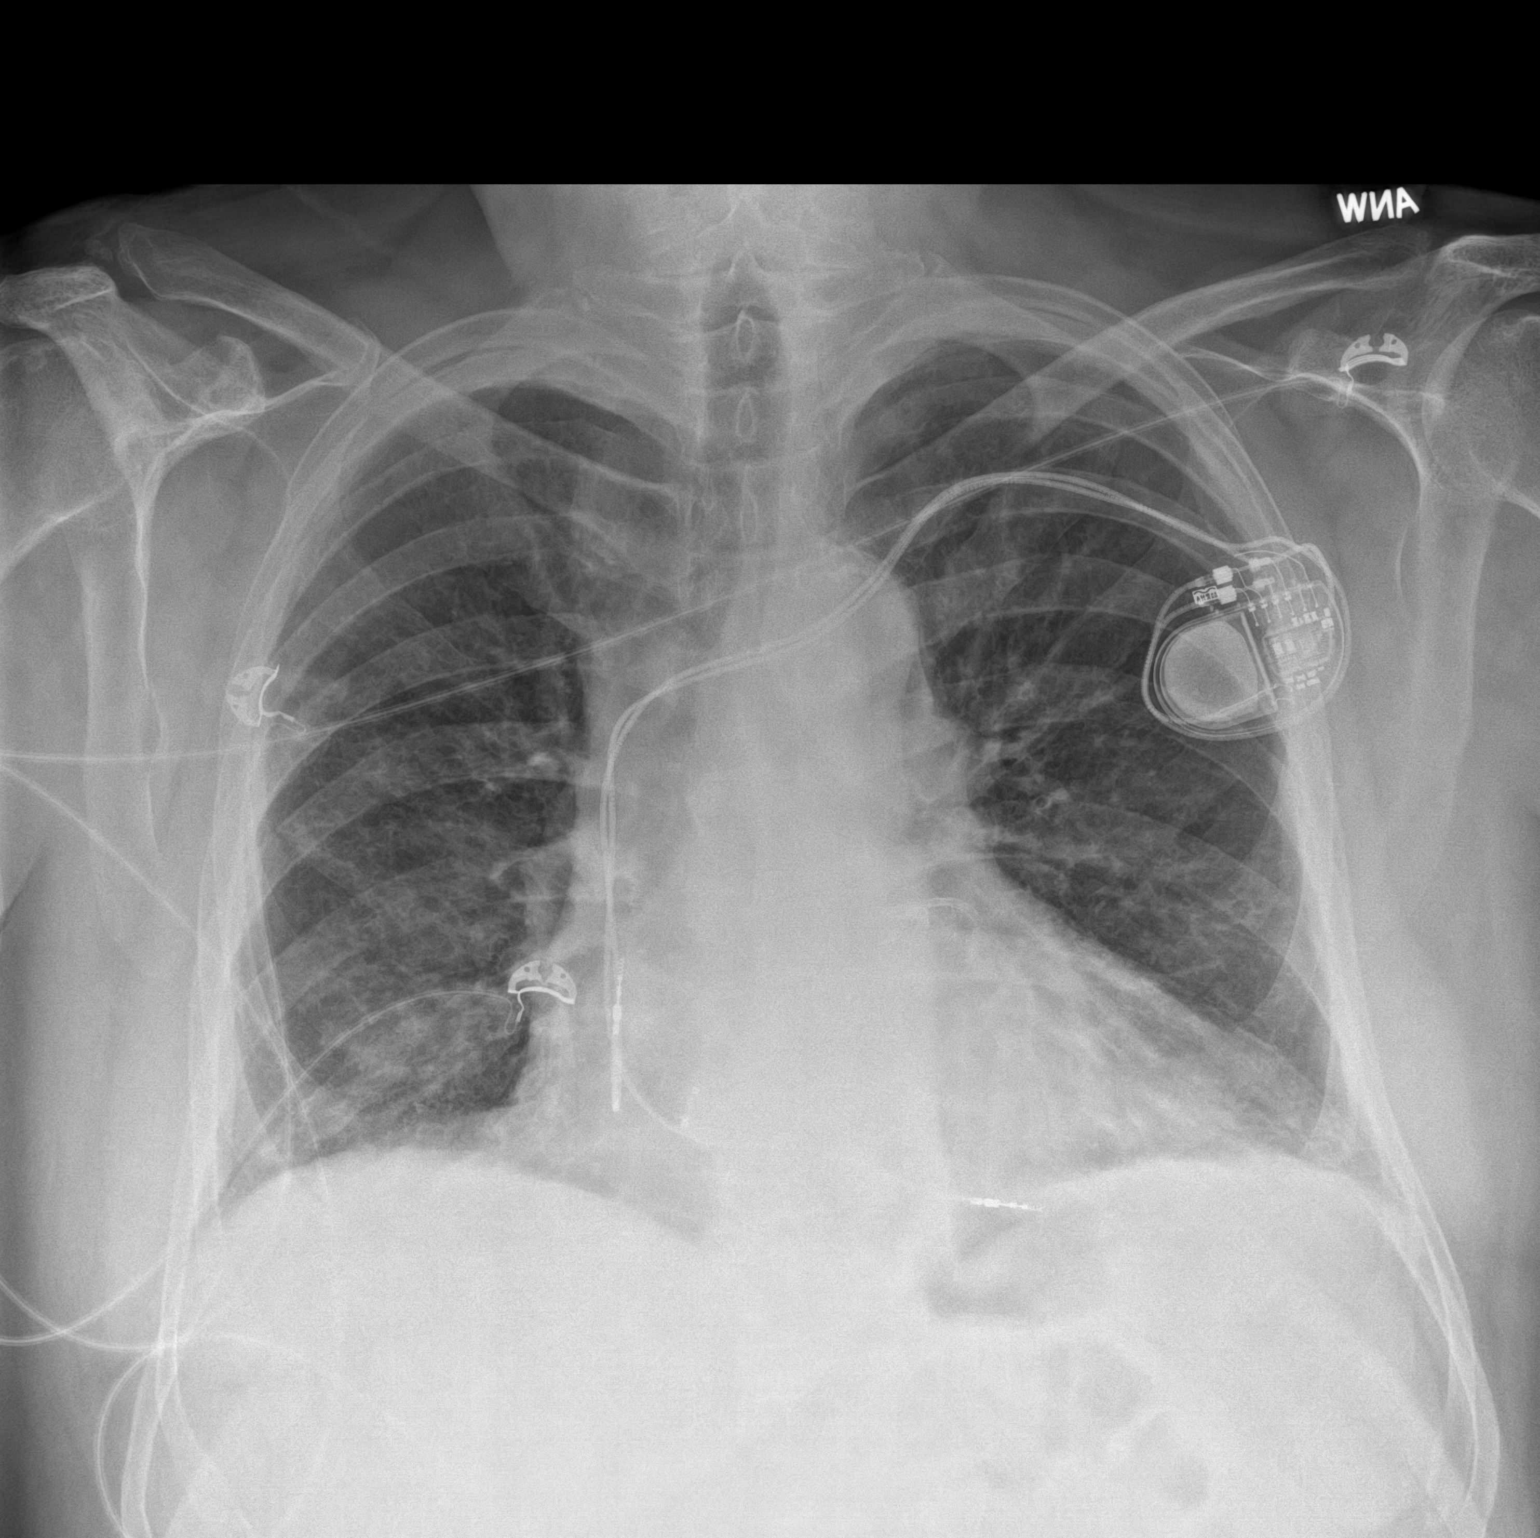

[chest lat]
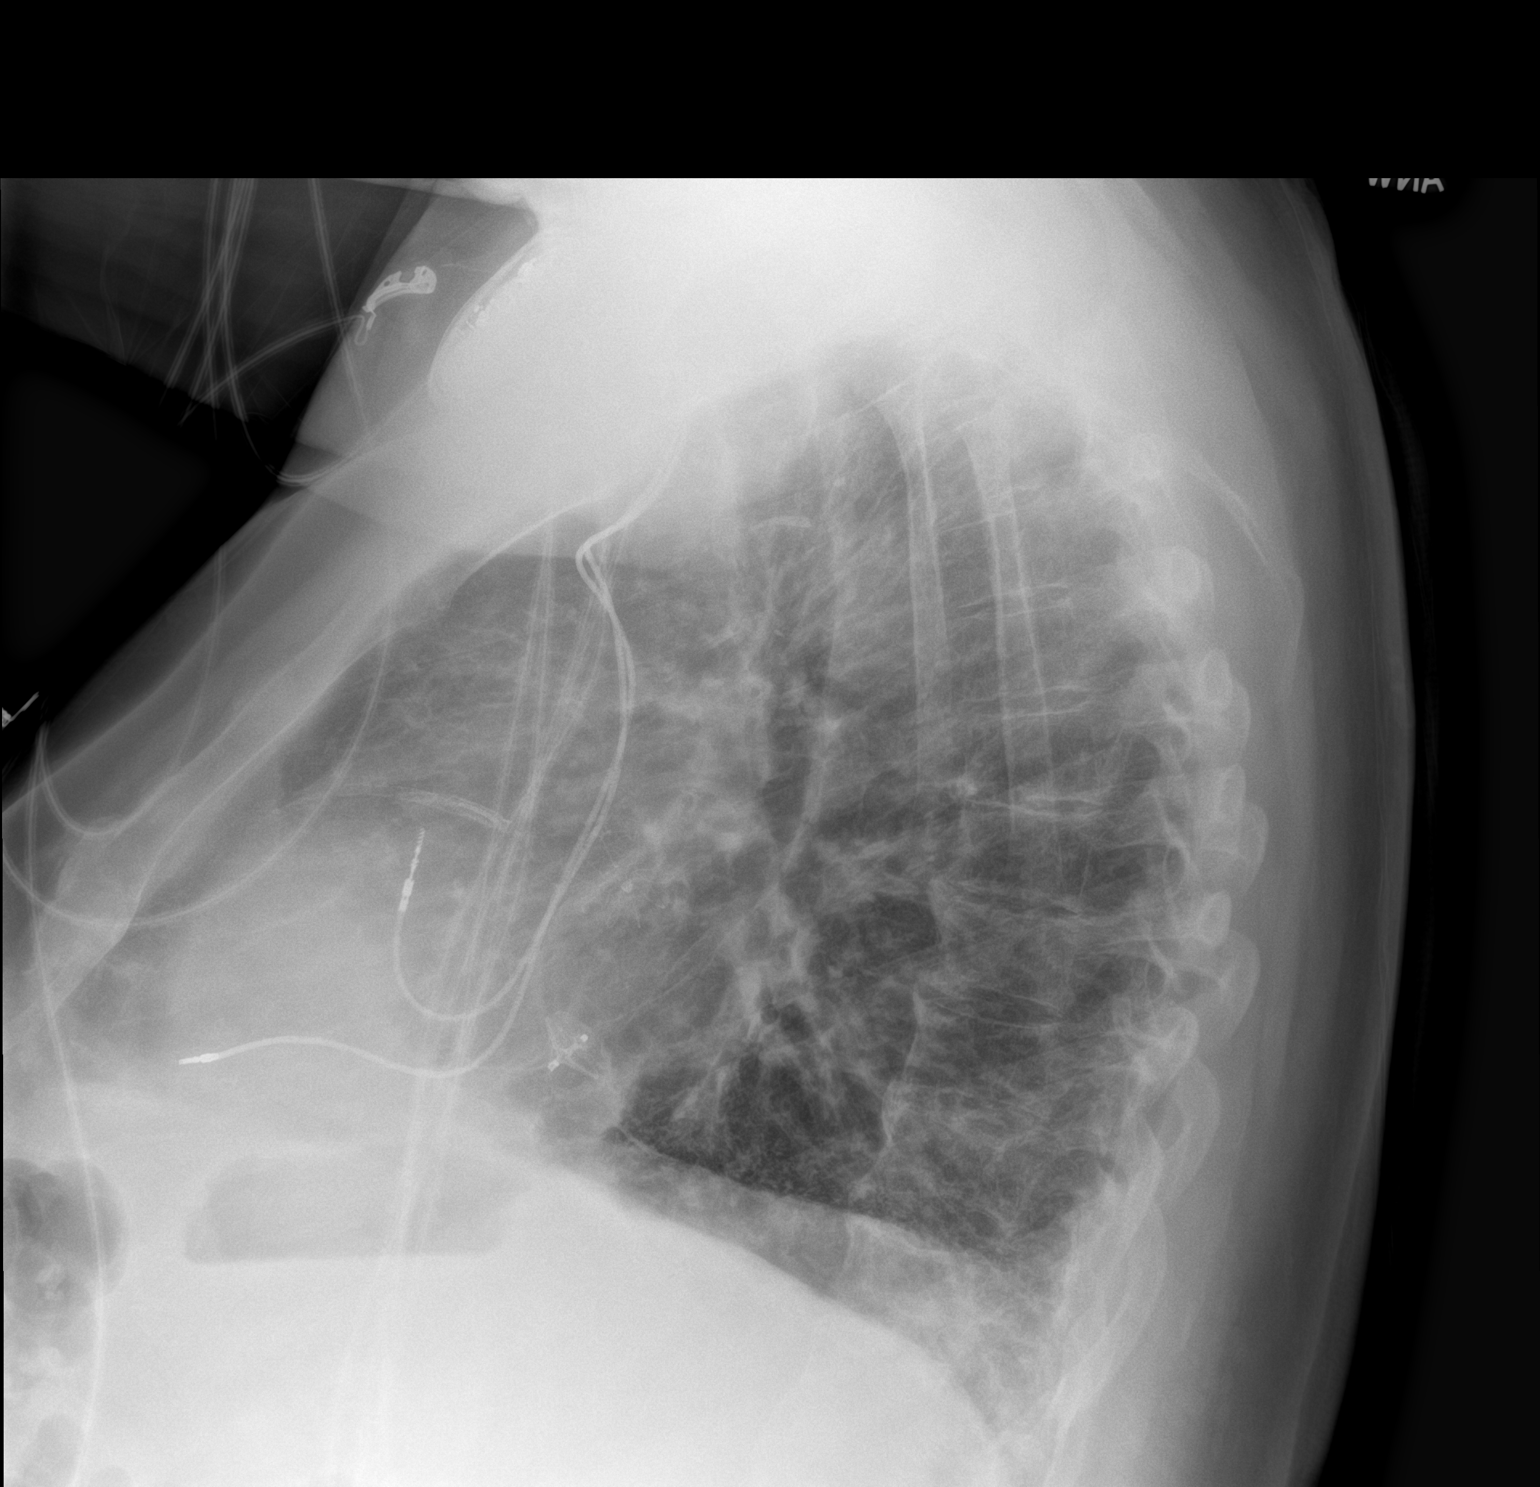

[2 of 2 positions shown; findings below may reference images not displayed]

FINDINGS: Transverse diameter of heart is increased. Pacemaker battery is seen
in the left infraclavicular region. There are no signs of pulmonary
edema. Increased markings are seen in both lower lung fields. There
is no significant pleural effusion or pneumothorax.
IMPRESSION: There are no signs of pulmonary edema or new focal pulmonary
consolidation. Increased interstitial markings in the lower lung
fields may suggest scarring.

## 2021-06-30 MED ORDER — ALBUTEROL SULFATE (2.5 MG/3ML) 0.083% IN NEBU
5.0000 mg | INHALATION_SOLUTION | Freq: Once | RESPIRATORY_TRACT | Status: AC
  Administered 2021-06-30: 5 mg via RESPIRATORY_TRACT
  Filled 2021-06-30: qty 6

## 2021-06-30 MED ORDER — HYDROCOD POLI-CHLORPHE POLI ER 10-8 MG/5ML PO SUER
5.0000 mL | Freq: Once | ORAL | Status: AC
  Administered 2021-06-30: 5 mL via ORAL
  Filled 2021-06-30: qty 5

## 2021-06-30 MED ORDER — METHYLPREDNISOLONE SODIUM SUCC 125 MG IJ SOLR
125.0000 mg | Freq: Once | INTRAMUSCULAR | Status: AC
Start: 2021-06-30 — End: 2021-06-30
  Administered 2021-06-30: 125 mg via INTRAVENOUS
  Filled 2021-06-30 (×2): qty 2

## 2021-06-30 MED ORDER — PREDNISONE 10 MG PO TABS
20.0000 mg | ORAL_TABLET | Freq: Every day | ORAL | 0 refills | Status: DC
  Filled 2021-06-30: qty 10, 5d supply, fill #0

## 2021-06-30 NOTE — ED Triage Notes (Signed)
Patient c/o dry cough for over a week, and stated causing som echest pains; wife at bedside reported CT done 2/21 and showed pneumonia; symptoms been going on since since the 15th of last month, patient has had multiple ABX, magnesium, breathing treatments, and has not improved.  ?

## 2021-06-30 NOTE — ED Provider Notes (Signed)
[facility] EMERGENCY DEPT Provider Note   CSN: [id number] Arrival date & time: 06/30/21  1104     History  Chief Complaint  Patient presents with   Chest Pain   Cough    Mark Thomas is a 71 y.o. male.  HPI 71 yo male with recent diagnosis of pneumonia -patient seen at urgent care on 2/18 and started on levaquin, patient had covid at Christmas and had covid pneumonia and concern for pneumonia at ucc.  Patient seen by Dr. Charise Carwin on 2/21, ct obtained of chest and told he had pneumonia and zithromax added.  Patient taking codeine.  From Tuesday to Friday of last week, worsening cough and fever.  Came to ED 2/24 and given rocephin and zithromax, steroids, and magnesium.  States they wanted to admit, but he did not want to be admitted.  Completed op zithromax and no other antibiotics since 2/25.  Patient continues albuterol nebs at home and continues to be dyspneic and "tight", continues with cough and chest pain with cough, does not think heart pain but does not feel like heart pain.  Decreased po intake,no vomiting and keeping fluids down.  Some loose stool-2-3 for past 2 days.      Home Medications Prior to Admission medications   Medication Sig Start Date End Date Taking? Authorizing Provider  predniSONE (DELTASONE) 10 MG tablet Take 2 tablets (20 mg total) by mouth daily. 06/30/21  Yes Pattricia Boss, MD  acetaminophen (TYLENOL) 500 MG tablet Take 1,000 mg by mouth every 6 (six) hours as needed for mild pain.    [provider]  albuterol (PROVENTIL) (2.5 MG/3ML) 0.083% nebulizer solution Take 3 mLs (2.5 mg total) by nebulization every 6 (six) hours as needed for wheezing or shortness of breath. 06/25/21   Hayden Rasmussen, MD  albuterol (VENTOLIN HFA) 108 (90 Base) MCG/ACT inhaler Inhale 2 puffs into the lungs every 4 (four) hours as needed for wheezing or shortness of breath.    [provider]  allopurinol (ZYLOPRIM) 100 MG tablet Take 100 mg by  mouth daily. 09/22/20   [provider]  AMBULATORY NON [place] acquired pneumonia of right upper lobe of lung Dx J18.9 Nebulizer machine with supplies 06/25/21   Hali Marry, MD  aspirin 81 MG EC tablet Take 1 tablet by mouth daily. 05/05/14   [provider]  atorvastatin (LIPITOR) 80 MG tablet Take 80 mg by mouth daily. 11/18/20   [provider]  chlorpheniramine-HYDROcodone (TUSSIONEX PENNKINETIC ER) 10-8 MG/5ML Take 5 mLs by mouth every 12 (twelve) hours as needed for cough. 06/25/21   Hayden Rasmussen, MD  Cholecalciferol (VITAMIN D-3) 125 MCG (5000 UT) TABS Take 1 tablet by mouth daily.    [provider]  clopidogrel (PLAVIX) 75 MG tablet Take 75 mg by mouth daily. 11/18/20   [provider]  escitalopram (LEXAPRO) 20 MG tablet Take 1.5 tablets (30 mg total) by mouth daily. 05/04/21   Merian Capron, MD  ferrous sulfate 325 (65 FE) MG EC tablet Take 1 tablet (325 mg total) by mouth in the morning and at bedtime. 03/19/21 08/30/21  Luetta Nutting, DO  fluticasone (FLONASE) 50 MCG/ACT nasal spray Place 2 sprays into both nostrils daily. 05/07/21   Luetta Nutting, DO  fluticasone (FLOVENT HFA) 110 MCG/ACT inhaler Inhale 1 puff into the lungs daily. 06/11/21   Luetta Nutting, DO  furosemide (LASIX) 20 MG tablet Take 1 tablet (20 mg total) by mouth daily. 03/22/21 06/20/21  Park Liter, MD  latanoprost (XALATAN) 0.005 % ophthalmic solution Place 1 drop into both eyes at bedtime. 12/07/20   [provider]  levofloxacin (LEVAQUIN) 500 MG tablet Take 1 tablet (500 mg total) by mouth daily. 06/19/21   Raylene Everts, MD  losartan (COZAAR) 25 MG tablet Take 25 mg by mouth daily as needed (BP >160). 09/26/20   [provider]  magnesium oxide (MAG-OX) 400 MG tablet Take 400 mg by mouth daily.    [provider]  memantine (NAMENDA) 10 MG tablet Take 1 tablet (10 mg total) by mouth 2 (two) times daily.  05/11/21 08/09/21  Alric Ran, MD  metoprolol tartrate (LOPRESSOR) 25 MG tablet Take 12.5 mg by mouth 2 (two) times daily. 11/18/20   [provider]  nitroGLYCERIN (NITROLINGUAL) 0.4 MG/SPRAY spray Place 1 spray under the tongue every 5 (five) minutes x 3 doses as needed for chest pain. 05/05/14   [provider]  Oxcarbazepine (TRILEPTAL) 300 MG tablet Take 1 tablet (300 mg total) by mouth daily. 06/01/21 08/30/21  Alric Ran, MD  pantoprazole (PROTONIX) 40 MG tablet Take 1 tablet (40 mg total) by mouth every evening. 05/04/21   Luetta Nutting, DO  potassium citrate (UROCIT-K) 10 MEQ (1080 MG) SR tablet Take 10 mEq by mouth daily. 09/22/20   [provider]  primidone (MYSOLINE) 50 MG tablet Take 2 tablets (100 mg total) by mouth 2 (two) times daily. 05/10/21 08/08/21  Alric Ran, MD  QUEtiapine (SEROQUEL) 50 MG tablet Take 2 tablets (100 mg total) by mouth at bedtime. 05/07/21 06/06/21  Merian Capron, MD  ranolazine (RANEXA) 1000 MG SR tablet Take 1,000 mg by mouth 2 (two) times daily. 11/18/20   [provider]  rOPINIRole (REQUIP XL) 4 MG 24 hr tablet Take 1 tablet (4 mg total) by mouth at bedtime. 05/27/21 08/25/21  Alric Ran, MD  triamcinolone ointment (KENALOG) 0.1 % Apply 1 application topically 2 (two) times daily. 01/21/21   [provider]      Allergies    Dilaudid [hydromorphone], Bactrim [sulfamethoxazole-trimethoprim], Benadryl [diphenhydramine], and Phenobarbital    Review of Systems   Review of Systems  All other systems reviewed and are negative.  Physical Exam Updated Vital Signs BP (!) 149/85    Pulse 77    Temp 97.6 F (36.4 C) (Oral)    Resp 13    Ht 1.854 m (_0 )    Wt 113.4 kg    SpO2 96%    BMI 32.98 kg/m  Physical Exam Vitals and nursing note reviewed.  HENT:     Head: Normocephalic.  Eyes:     Pupils: Pupils are equal, round, and reactive to light.  Cardiovascular:     Rate and Rhythm: Normal rate and regular  rhythm.     Heart sounds: Normal heart sounds.  Pulmonary:     Effort: Pulmonary effort is normal.     Breath sounds: Examination of the right-lower field reveals wheezing. Examination of the left-lower field reveals wheezing. Wheezing present.  Musculoskeletal:        General: Normal range of motion.     Cervical back: Normal range of motion.  Skin:    General: Skin is warm and dry.     Capillary Refill: Capillary refill takes less than 2 seconds.  Neurological:     General: No focal deficit present.     Mental Status: He is alert.  Psychiatric:        Mood and Affect:  Mood normal.    ED Results / Procedures / Treatments   Labs (all labs ordered are listed, but only abnormal results are displayed) Labs Reviewed  CBC - Abnormal; Notable for the following components:      Result Value   RBC 4.15 (*)    MCV 100.7 (*)    All other components within normal limits  BASIC METABOLIC PANEL - Abnormal; Notable for the following components:   Glucose, Bld 114 (*)    All other components within normal limits  RESP PANEL BY RT-PCR (FLU A&B, COVID) ARPGX2  TROPONIN I (HIGH SENSITIVITY)  TROPONIN I (HIGH SENSITIVITY)    EKG EKG Interpretation  Date/Time:  Wednesday June 30 2021 13:40:28 EST Ventricular Rate:  75 PR Interval:  166 QRS Duration: 95 QT Interval:  416 QTC Calculation: 465 R Axis:   25 Text Interpretation: Sinus rhythm Abnormal R-wave progression, early transition Nonspecific repol abnormality, lateral leads no change from prior ekg of 25 June 2021 Confirmed by Pattricia Boss [phone]) on 06/30/2021 2:41:28 PM  Radiology DG Chest 2 View  Result Date: 06/30/2021 CLINICAL DATA:  Cough, fever EXAM: CHEST - 2 VIEW COMPARISON:  04/28/2021 FINDINGS: Transverse diameter of heart is increased. Pacemaker battery is seen in the left infraclavicular region. There are no signs of pulmonary edema. Increased markings are seen in both lower lung fields. There is no significant pleural  effusion or pneumothorax. IMPRESSION: There are no signs of pulmonary edema or new focal pulmonary consolidation. Increased interstitial markings in the lower lung fields may suggest scarring. Electronically Signed   By: Elmer Picker M.D.   On: 06/30/2021 12:29    Procedures Procedures    Medications Ordered in ED Medications  methylPREDNISolone sodium succinate (SOLU-MEDROL) 125 mg/2 mL injection 125 mg (125 mg Intravenous Given 06/30/21 1305)  albuterol (PROVENTIL) (2.5 MG/3ML) 0.083% nebulizer solution 5 mg (5 mg Nebulization Given 06/30/21 1257)  chlorpheniramine-HYDROcodone 10-8 MG/5ML suspension 5 mL (5 mLs Oral Given 06/30/21 1443)    ED Course/ Medical Decision Making/ A&P Clinical Course as of 06/30/21 1454  Wed Jun 30, 2021  1300 Chest x-Silvanna Ohmer reviewed and interpreted with no definitive acute infiltrate Radiologist interpretation no focal infiltrate or edema [DR]  1437 CBC reviewed and interpreted appears normal limits B neck reviewed and interpreted and mild hyperglycemia otherwise negative Respiratory panel reviewed and interpreted and all negative [DR]    Clinical Course User Index [DR] Pattricia Boss, MD                           Medical Decision Making 9 male with a recent diagnosis of pneumonia presents today complaining of ongoing feeling poorly.  On exam he continues to have some expiratory wheezes.  Patient given albuterol and Solu-Medrol here.  Vital signs are stable here with oxygen saturation at 96%.  Chest x-Samaad Hashem reviewed no evidence of change or definite infiltrate CBC and chemistries reviewed and appear normal. Plan prednisone x5 days-discussed risk and benefits of prednisone.  He and his wife voiced concern regarding his depression.  Plan prescription for prednisone.  He will use it unless he feels like it is causing him problems in which case he will stop. We will continue home albuterol Patient appears stable for discharge home.  Amount and/or Complexity of  Data Reviewed External Data Reviewed: notes. Labs: ordered. Decision-making details documented in ED Course. Radiology: ordered. Decision-making details documented in ED Course. ECG/medicine tests: ordered. Decision-making details documented in ED Course.  Details: Patient on monitor and continuous review reveals normal rate  Risk Prescription drug management. Decision regarding hospitalization.           Final Clinical Impression(s) / ED Diagnoses Final diagnoses:  Acute cough  Wheezing    Rx / DC Orders ED Discharge Orders          Ordered    predniSONE (DELTASONE) 10 MG tablet  Daily        06/30/21 1453              Pattricia Boss, MD 06/30/21 1454

## 2021-06-30 NOTE — Discharge Instructions (Addendum)
Your x-rays and labs appear within normal limits here today ?Your oxygen level is good at 96% ?Please take prednisone as prescribed unless you are having side effects in which case you can stop it at any time. ?Continue the albuterol nebulizers every 4 hours ?Please return if you are having shortness of breath or other worsening symptoms ? ?

## 2021-07-02 ENCOUNTER — Inpatient Hospital Stay: Payer: Medicare Other | Admitting: Family Medicine

## 2021-07-02 ENCOUNTER — Telehealth: Payer: Self-pay | Admitting: General Practice

## 2021-07-02 NOTE — Telephone Encounter (Signed)
Transition Care Management Follow-up Telephone Call ?Date of discharge and from where: 06/30/21  from [facility] ?How have you been since you were released from the hospital? Doing a little better but still wheezing at times. Still coughing a lot.  ?Any questions or concerns? No ? ?Items Reviewed: ?Did the pt receive and understand the discharge instructions provided? Yes  ?Medications obtained and verified? Yes  ?Other? No  ?Any new allergies since your discharge? No  ?Dietary orders reviewed? Yes ?Do you have support at home? Yes  ? ?Home Care and Equipment/Supplies: ?Were home health services ordered? no ? ?Functional Questionnaire: (I = Independent and D = Dependent) ?ADLs: D ? ?Bathing/Dressing- D ? ?Meal Prep- D ? ?Eating- I ? ?Maintaining continence- I ? ?Transferring/Ambulation- I ? ?Managing Meds- I ? ?Follow up appointments reviewed: ? ?PCP Hospital f/u appt confirmed? Yes  Scheduled to see Dr Zigmund Daniel on 07/08/21. ?[facility] f/u appt confirmed? No   ?Are transportation arrangements needed? No  ?If their condition worsens, is the pt aware to call PCP or go to the Emergency Dept.? Yes ?Was the patient provided with contact information for the PCP's office or ED? Yes ?Was to pt encouraged to call back with questions or concerns? Yes  ?

## 2021-07-07 ENCOUNTER — Ambulatory Visit (INDEPENDENT_AMBULATORY_CARE_PROVIDER_SITE_OTHER): Payer: Medicare Other | Admitting: Family Medicine

## 2021-07-07 ENCOUNTER — Encounter: Payer: Self-pay | Admitting: Family Medicine

## 2021-07-07 ENCOUNTER — Other Ambulatory Visit: Payer: Self-pay

## 2021-07-07 ENCOUNTER — Ambulatory Visit: Payer: TRICARE For Life (TFL)

## 2021-07-07 DIAGNOSIS — H9042 Sensorineural hearing loss, unilateral, left ear, with unrestricted hearing on the contralateral side: Secondary | ICD-10-CM | POA: Insufficient documentation

## 2021-07-07 DIAGNOSIS — J189 Pneumonia, unspecified organism: Secondary | ICD-10-CM | POA: Diagnosis not present

## 2021-07-07 DIAGNOSIS — M12811 Other specific arthropathies, not elsewhere classified, right shoulder: Secondary | ICD-10-CM

## 2021-07-07 DIAGNOSIS — I872 Venous insufficiency (chronic) (peripheral): Secondary | ICD-10-CM | POA: Insufficient documentation

## 2021-07-07 DIAGNOSIS — L409 Psoriasis, unspecified: Secondary | ICD-10-CM | POA: Insufficient documentation

## 2021-07-07 MED ORDER — PANTOPRAZOLE SODIUM 40 MG PO TBEC: 40.0000 mg | DELAYED_RELEASE_TABLET | Freq: Every evening | ORAL | 3 refills | Status: AC

## 2021-07-07 MED ORDER — ALBUTEROL SULFATE (2.5 MG/3ML) 0.083% IN NEBU
2.5000 mg | INHALATION_SOLUTION | Freq: Four times a day (QID) | RESPIRATORY_TRACT | 12 refills | Status: DC | PRN
Start: 2021-07-07 — End: 2021-07-15

## 2021-07-07 MED ORDER — FLUTICASONE PROPIONATE HFA 110 MCG/ACT IN AERO: 1.0000 | INHALATION_SPRAY | Freq: Every day | RESPIRATORY_TRACT | 3 refills | Status: DC

## 2021-07-07 NOTE — Progress Notes (Signed)
?Mark Thomas - 71 y.o. male MRN [id number]  Date of birth: [birth year] ? ?Subjective ?Chief Complaint  ?Patient presents with  ? Hospitalization Follow-up  ? ? ?HPI ?Mark Thomas is a 71 year old male here today for follow-up of recent ER visits.  Had COVID in December 2022 with associated pneumonia.  Treated with Augmentin, doxycycline and steroids.  He did have improvement of symptoms however a few weeks later began having more cough and congestion.  Initially seen in urgent care and started on Levaquin.  Also prescribed prednisone but did not start this due to intolerance.  He was seen in our clinic once again by Dr. Madilyn Fireman and given injection of Depo-Medrol and azithromycin was added..  CT of the chest was ordered showing patchy groundglass nodules in the right upper lobe favoring an infection.  He also had a irregular right paratracheal lymph node which appeared to be reactive.  He was once again seen in the ER due to continued symptoms with wheezing and dyspnea.  Declined admission at that time due to needing to complete closing on his house.  He did return to the ER again on 06/30/2021 for continued cough and wheezing however he was noting improvement by this point and did not require admission.  Today reports he continues to have some cough but overall he is feeling much better.  He is using nebulized albuterol as needed and Flovent daily.  Currently they are borrowing a nebulizer compressor from our clinic, he needs a prescription for his own nebulizer compressor. ? ?ROS:  A comprehensive ROS was completed and negative except as noted per HPI ? ?Allergies  ?Allergen Reactions  ? Dilaudid [Hydromorphone] Other (See Comments)  ?  Respiratory Arrest  ? Bactrim [Sulfamethoxazole-Trimethoprim] Rash  ? Benadryl [Diphenhydramine] Other (See Comments)  ?  Muscle spasm   ? Phenobarbital   ? ? ?Past Medical History:  ?Diagnosis Date  ? Anxiety   ? Coronary artery disease   ? Depression   ? Essential tremor   ? GERD  (gastroesophageal reflux disease)   ? High cholesterol   ? Hypertension   ? Myocardial infarct [facility])   ? Orthostatic hypotension   ? Stroke [facility])   ? [place]   ? ? ?Past Surgical History:  ?Procedure Laterality Date  ? APPENDECTOMY    ? CARDIAC SURGERY    ? CHOLECYSTECTOMY    ? HERNIA REPAIR    ? NASAL SINUS SURGERY    ? SHOULDER SURGERY    ? ? ?Social History  ? ?Socioeconomic History  ? Marital status: Married  ?  Spouse name: Mark Thomas  ? Number of children: 2  ? Years of education: 72  ? Highest education level: Master's degree (e.g., MA, MS, MEng, MEd, MSW, MBA)  ?Occupational History  ? Occupation: Retired  ?Tobacco Use  ? Smoking status: Never  ?  Passive exposure: Never  ? Smokeless tobacco: Never  ?Vaping Use  ? Vaping Use: Never used  ?Substance and Sexual Activity  ? Alcohol use: Not Currently  ? Drug use: Never  ? Sexual activity: Not on file  ?Other Topics Concern  ? Not on file  ?Social History Narrative  ? Lives with his wife. He has vascular dementia. His wife is his primary caretaker.He enjoys watching football and spending time with his grandchildren.  ? ?Social Determinants of Health  ? ?Financial Resource Strain: Low Risk   ? Difficulty of Paying Living Expenses: Not hard at all  ?Food Insecurity: No Food  Insecurity  ? Worried About Charity fundraiser in the Last Year: Never true  ? Ran Out of Food in the Last Year: Never true  ?Transportation Needs: No Transportation Needs  ? Lack of Transportation (Medical): No  ? Lack of Transportation (Non-Medical): No  ?Physical Activity: Insufficiently Active  ? Days of Exercise per Week: 2 days  ? Minutes of Exercise per Session: 50 min  ?Stress: No Stress Concern Present  ? Feeling of Stress : Not at all  ?Social Connections: Socially Integrated  ? Frequency of Communication with Friends and Family: More than three times a week  ? Frequency of Social Gatherings with Friends and Family: Once a week  ? Attends Religious Services: More  than 4 times per year  ? Active Member of Clubs or Organizations: Yes  ? Attends Archivist Meetings: More than 4 times per year  ? Marital Status: Married  ? ? ?Family History  ?Problem Relation Age of Onset  ? Hypertension Mother   ? Stroke Mother   ? Stroke Father   ? Hypertension Father   ? ? ?Health Maintenance  ?Topic Date Due  ? Zoster Vaccines- Shingrix (1 of 2) Never done  ? COVID-19 Vaccine (4 - Booster for Moderna series) 06/11/2020  ? Hepatitis C Screening  03/15/2022 (Originally 12/22/1968)  ? TETANUS/TDAP  09/19/2024  ? COLONOSCOPY (Pts 45-96yr Insurance coverage will need to be confirmed)  08/09/2025  ? Pneumonia Vaccine 71 Years old  Completed  ? INFLUENZA VACCINE  Completed  ? HPV VACCINES  Aged Out  ? ? ? ?----------------------------------------------------------------------------------------------------------------------------------------------------------------------------------------------------------------- ?Physical Exam ?BP 100/67 (BP Location: Left Arm, Patient Position: Sitting, Cuff Size: Large)   Pulse 77   Temp 97.9 ?F (36.6 ?C)   Wt 261 lb (118.4 kg)   SpO2 98%   BMI 34.43 kg/m?  ? ?Physical Exam ?Constitutional:   ?   Appearance: Normal appearance.  ?Eyes:  ?   General: No scleral icterus. ?Cardiovascular:  ?   Rate and Rhythm: Normal rate and regular rhythm.  ?Pulmonary:  ?   Effort: Pulmonary effort is normal.  ?   Breath sounds: Normal breath sounds.  ?Musculoskeletal:  ?   Cervical back: Neck supple.  ?Neurological:  ?   General: No focal deficit present.  ?   Mental Status: He is alert.  ?Psychiatric:     ?   Mood and Affect: Mood normal.     ?   Behavior: Behavior normal.  ? ? ?------------------------------------------------------------------------------------------------------------------------------------------------------------------------------------------------------------------- ?Assessment and Plan ? ?Community acquired pneumonia ?Feeling much better at  this time.  He has completed multiple courses of antibiotics and steroids.  I do not think he needs anything additional at this time.  Continue nebulized albuterol as needed.  I am prescribing a nebulizer compressor for him to administer this medication at home.  Prescription sent through [email or web address].  He will continue Flovent daily.  I will plan to see him again in 2 months or sooner if needed if he is having worsening of symptoms again.  We will plan to repeat CT scan at that time. ? ?Rotator cuff arthropathy of right shoulder ?He is scheduled to have reverse shoulder arthroplasty next month however due to his recent illness and prolonged recovery from this I suggest that he postpone this for a few months. ? ? ?Meds ordered this encounter  ?Medications  ? albuterol (PROVENTIL) (2.5 MG/3ML) 0.083% nebulizer solution  ?  Sig: Take 3 mLs (2.5 mg total) by nebulization  every 6 (six) hours as needed for wheezing or shortness of breath.  ?  Dispense:  75 mL  ?  Refill:  12  ? fluticasone (FLOVENT HFA) 110 MCG/ACT inhaler  ?  Sig: Inhale 1 puff into the lungs daily.  ?  Dispense:  1 each  ?  Refill:  3  ? pantoprazole (PROTONIX) 40 MG tablet  ?  Sig: Take 1 tablet (40 mg total) by mouth every evening.  ?  Dispense:  90 tablet  ?  Refill:  3  ? ? ?Return in about 2 months (around 09/06/2021) for F/u Pneumonia. ? ? ? ?This visit occurred during the SARS-CoV-2 public health emergency.  Safety protocols were in place, including screening questions prior to the visit, additional usage of staff PPE, and extensive cleaning of exam room while observing appropriate contact time as indicated for disinfecting solutions.  ? ?

## 2021-07-07 NOTE — Assessment & Plan Note (Signed)
He is scheduled to have reverse shoulder arthroplasty next month however due to his recent illness and prolonged recovery from this I suggest that he postpone this for a few months. ?

## 2021-07-07 NOTE — Assessment & Plan Note (Signed)
Feeling much better at this time.  He has completed multiple courses of antibiotics and steroids.  I do not think he needs anything additional at this time.  Continue nebulized albuterol as needed.  I am prescribing a nebulizer compressor for him to administer this medication at home.  Prescription sent through [email or web address].  He will continue Flovent daily.  I will plan to see him again in 2 months or sooner if needed if he is having worsening of symptoms again.  We will plan to repeat CT scan at that time. ?

## 2021-07-08 ENCOUNTER — Inpatient Hospital Stay: Payer: Medicare Other | Admitting: Family Medicine

## 2021-07-14 ENCOUNTER — Ambulatory Visit: Payer: TRICARE For Life (TFL) | Admitting: Family Medicine

## 2021-07-15 ENCOUNTER — Other Ambulatory Visit: Payer: Self-pay

## 2021-07-15 MED ORDER — FLUTICASONE PROPIONATE HFA 110 MCG/ACT IN AERO: 1.0000 | INHALATION_SPRAY | Freq: Every day | RESPIRATORY_TRACT | 3 refills | Status: DC

## 2021-07-15 MED ORDER — ALBUTEROL SULFATE (2.5 MG/3ML) 0.083% IN NEBU
2.5000 mg | INHALATION_SOLUTION | Freq: Four times a day (QID) | RESPIRATORY_TRACT | 12 refills | Status: AC | PRN
Start: 2021-07-15 — End: ?

## 2021-07-16 ENCOUNTER — Other Ambulatory Visit: Payer: Self-pay

## 2021-07-16 MED ORDER — FLUTICASONE PROPIONATE HFA 110 MCG/ACT IN AERO: 2.0000 | INHALATION_SPRAY | Freq: Two times a day (BID) | RESPIRATORY_TRACT | 3 refills | Status: DC

## 2021-07-28 ENCOUNTER — Encounter: Payer: Self-pay | Admitting: Sports Medicine

## 2021-07-28 ENCOUNTER — Telehealth (HOSPITAL_COMMUNITY): Payer: Self-pay | Admitting: Psychiatry

## 2021-07-28 ENCOUNTER — Other Ambulatory Visit: Payer: Self-pay

## 2021-07-28 ENCOUNTER — Ambulatory Visit (INDEPENDENT_AMBULATORY_CARE_PROVIDER_SITE_OTHER): Payer: Medicare Other

## 2021-07-28 ENCOUNTER — Ambulatory Visit (INDEPENDENT_AMBULATORY_CARE_PROVIDER_SITE_OTHER): Payer: Medicare Other | Admitting: Sports Medicine

## 2021-07-28 ENCOUNTER — Telehealth: Payer: Self-pay | Admitting: Family Medicine

## 2021-07-28 DIAGNOSIS — R059 Cough, unspecified: Secondary | ICD-10-CM | POA: Insufficient documentation

## 2021-07-28 DIAGNOSIS — R052 Subacute cough: Secondary | ICD-10-CM

## 2021-07-28 DIAGNOSIS — R062 Wheezing: Secondary | ICD-10-CM

## 2021-07-28 DIAGNOSIS — L821 Other seborrheic keratosis: Secondary | ICD-10-CM | POA: Diagnosis not present

## 2021-07-28 IMAGING — DX DG CHEST 2V
2 series · 2 of 2 positions shown · non-contrast
Comparison: [DATE] chest radiographs

CLINICAL DATA: Increasing cough and wheeze.

EXAM:
CHEST - 2 VIEW

[chest pa]
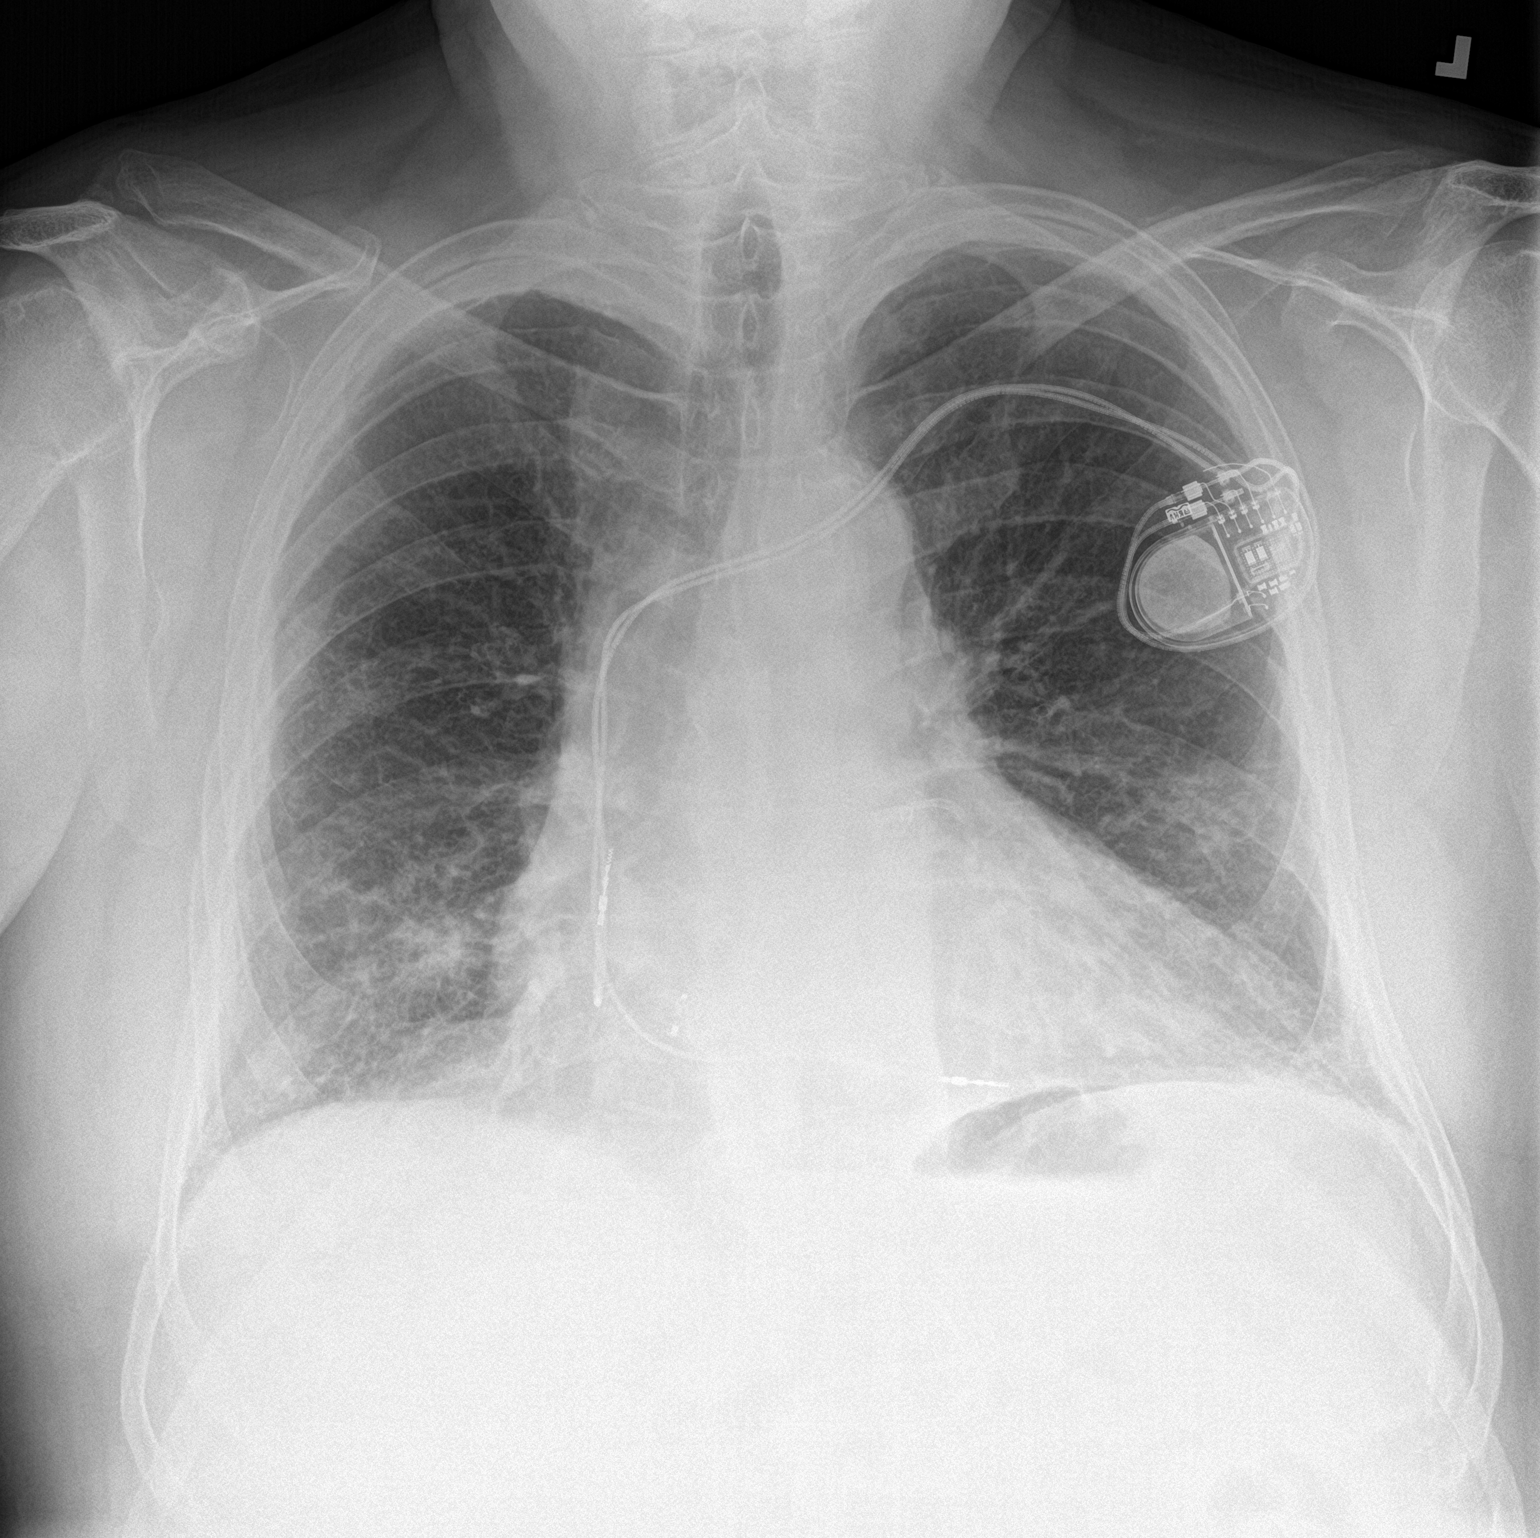

[chest lat]
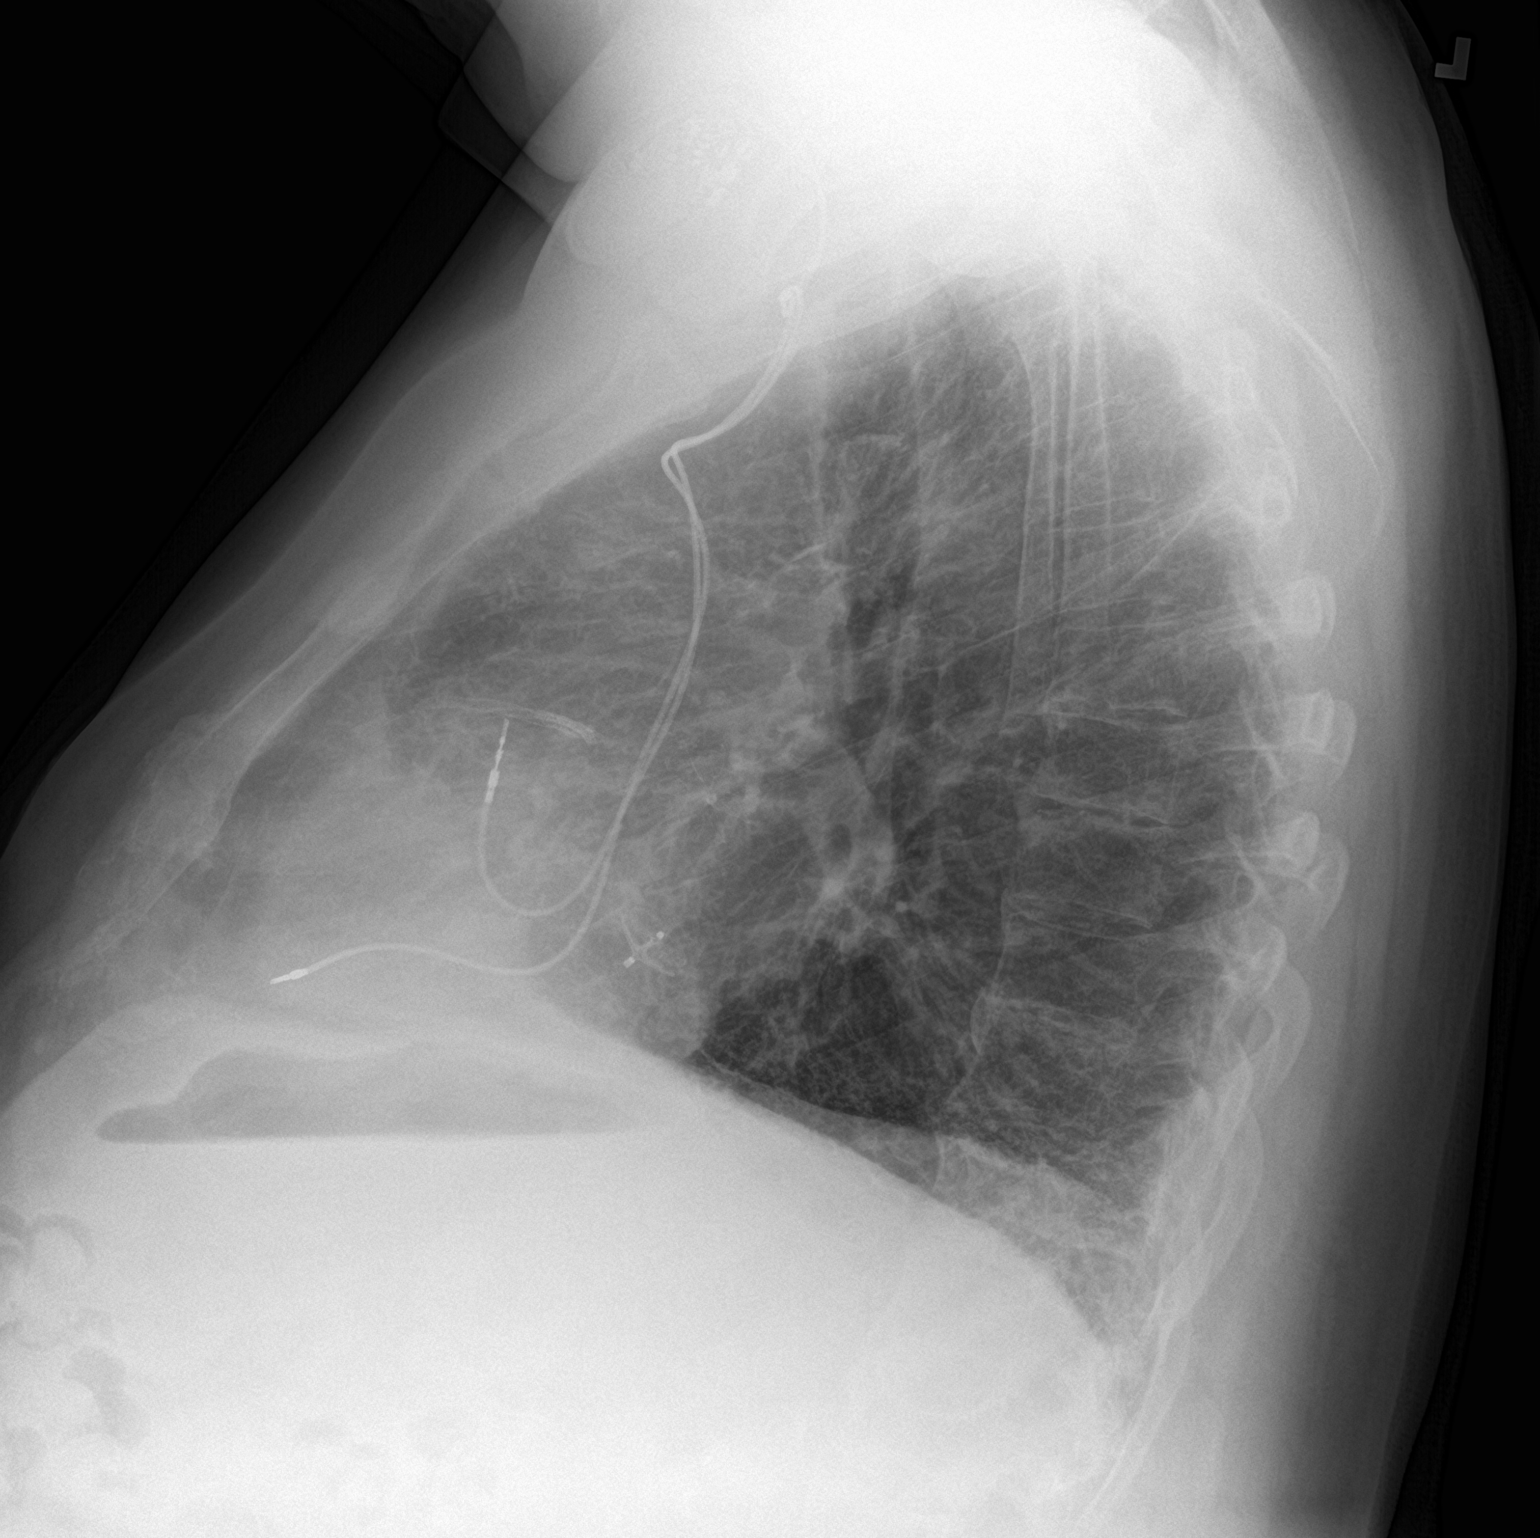

[2 of 2 positions shown; findings below may reference images not displayed]

FINDINGS: A dual lead pacemaker remains in place. The cardiac silhouette
remains mildly enlarged. Interstitial densities in both lower lungs
appear slightly increased. No overt pulmonary edema, pleural
effusion, or pneumothorax is identified. No acute osseous
abnormality is seen.
IMPRESSION: Interstitial densities in the lower lungs, largely reflecting
chronic interstitial disease though mildly increased from the prior
study which raises the possibility of a superimposed acute process
(such as atypical infection).

## 2021-07-28 MED ORDER — BUDESONIDE-FORMOTEROL FUMARATE 160-4.5 MCG/ACT IN AERO: 2.0000 | INHALATION_SPRAY | Freq: Two times a day (BID) | RESPIRATORY_TRACT | 3 refills | Status: DC

## 2021-07-28 MED ORDER — HYDROCOD POLI-CHLORPHE POLI ER 10-8 MG/5ML PO SUER: 5.0000 mL | Freq: Two times a day (BID) | ORAL | 0 refills | Status: DC | PRN

## 2021-07-28 MED ORDER — AZITHROMYCIN 250 MG PO TABS: ORAL_TABLET | ORAL | 0 refills | Status: DC

## 2021-07-28 MED ORDER — DEXAMETHASONE 4 MG PO TABS: 4.0000 mg | ORAL_TABLET | Freq: Three times a day (TID) | ORAL | 0 refills | Status: DC

## 2021-07-28 MED ORDER — QUETIAPINE FUMARATE 50 MG PO TABS
100.0000 mg | ORAL_TABLET | Freq: Every evening | ORAL | 0 refills | Status: DC
Start: 2021-07-28 — End: 2021-09-28

## 2021-07-28 NOTE — Telephone Encounter (Signed)
Pt called and said the pharmacy on file does not have hydrocodone. He wants to know if you can send it to [facility] [address] in [place]. Also the [facility] needs an [place].  ?

## 2021-07-28 NOTE — Telephone Encounter (Signed)
He needs to have a discussion with his wife before I do that.  She came in raising hell about the initial prescription.   ?

## 2021-07-28 NOTE — Telephone Encounter (Signed)
Sent for a 90 day supply ?

## 2021-07-28 NOTE — Progress Notes (Signed)
? ? ?  Procedures performed today:   ? ?None. ? ?Independent interpretation of notes and tests performed by another provider:  ? ?None. ? ?Brief History, Exam, Impression, and Recommendations:   ? ?Coughing ?This is a very pleasant 71 year old male with multiple medical problems, comorbidities, he has had several episodes of community-acquired pneumonia, he is on Flovent, albuterol, unfortunately he is having a recurrence of cough. ?He is wheezing significantly. ?I do think there is likely some underlying chronic lung disease, likely COPD or chronic bronchitis. ?We will treat him aggressively, Decadron, he declines prednisone, azithromycin, chest x-ray, we will refill his Tussionex. ?I do think he needs to be on more than Flovent so we will discontinue this and switch to Symbicort high-dose. ?If insufficient improvement we can switch him to Trelegy. ?When he recovers I do think he needs pre and postbronchodilator spirometry on his PCP schedule to evaluate for underlying obstructive lung disease. ? ?Seborrheic keratosis ?There is a seborrheic keratoses/skin tag mid back, PCP can do cryotherapy at a follow-up visit. ? ?Chronic process with exacerbation and pharmacologic intervention ? ? ?___________________________________________ ?Gwen Her. Dianah Field, M.D., ABFM., CAQSM. ?Primary Care and Sports Medicine ?[place] ? ?Adjunct Instructor of Family Medicine  ?[employer] ?

## 2021-07-28 NOTE — Assessment & Plan Note (Signed)
This is a very pleasant 71 year old male with multiple medical problems, comorbidities, he has had several episodes of community-acquired pneumonia, he is on Flovent, albuterol, unfortunately he is having a recurrence of cough. ?He is wheezing significantly. ?I do think there is likely some underlying chronic lung disease, likely COPD or chronic bronchitis. ?We will treat him aggressively, Decadron, he declines prednisone, azithromycin, chest x-ray, we will refill his Tussionex. ?I do think he needs to be on more than Flovent so we will discontinue this and switch to Symbicort high-dose. ?If insufficient improvement we can switch him to Trelegy. ?When he recovers I do think he needs pre and postbronchodilator spirometry on his PCP schedule to evaluate for underlying obstructive lung disease. ?

## 2021-07-28 NOTE — Telephone Encounter (Signed)
Pt came into the office stating that he needs ? ? ?Refill ? ?QUEtiapine (SEROQUEL) 50 MG tablet  ? ? ? ?Send to ? ?[id number]- [place], [place] - [address] AT [address] FROM [address] ?

## 2021-07-28 NOTE — Addendum Note (Signed)
Addended by: Silverio Decamp on: 07/28/2021 03:44 PM ? ? Modules accepted: Orders ? ?

## 2021-07-28 NOTE — Assessment & Plan Note (Signed)
There is a seborrheic keratoses/skin tag mid back, PCP can do cryotherapy at a follow-up visit. ?

## 2021-07-29 ENCOUNTER — Encounter: Payer: Self-pay | Admitting: Family Medicine

## 2021-07-29 ENCOUNTER — Telehealth: Payer: Self-pay

## 2021-07-29 DIAGNOSIS — R059 Cough, unspecified: Secondary | ICD-10-CM

## 2021-07-29 DIAGNOSIS — R062 Wheezing: Secondary | ICD-10-CM

## 2021-07-29 NOTE — Telephone Encounter (Signed)
Initiated Prior authorization YHT:[id number] 160-4.5MCG/ACT aerosol ?Via: Covermymeds ?Case/Key: [id number] ?Status: approved as of 07/28/21 ?Reason:Coverage Start Date:06/28/2021;Coverage End Date:05/01/2098 ?Notified Pt via: Mychart ?

## 2021-07-29 NOTE — Telephone Encounter (Signed)
His wife states this has already been taken care of. ? ?

## 2021-07-30 ENCOUNTER — Encounter: Payer: Self-pay | Admitting: Sports Medicine

## 2021-08-04 ENCOUNTER — Other Ambulatory Visit: Payer: Self-pay

## 2021-08-04 ENCOUNTER — Emergency Department (HOSPITAL_COMMUNITY): Payer: Medicare Other

## 2021-08-04 ENCOUNTER — Telehealth (HOSPITAL_COMMUNITY): Payer: Medicare Other | Admitting: Psychiatry

## 2021-08-04 ENCOUNTER — Ambulatory Visit: Payer: Medicare Other

## 2021-08-04 ENCOUNTER — Encounter (HOSPITAL_COMMUNITY): Payer: Self-pay | Admitting: *Deleted

## 2021-08-04 ENCOUNTER — Inpatient Hospital Stay (HOSPITAL_COMMUNITY)
Admission: EM | Admit: 2021-08-04 | Discharge: 2021-08-09 | DRG: 194 | Disposition: A | Discharge status: Home Health | Payer: Medicare Other | Attending: Internal Medicine | Admitting: Internal Medicine

## 2021-08-04 DIAGNOSIS — R531 Weakness: Secondary | ICD-10-CM

## 2021-08-04 DIAGNOSIS — N1831 Chronic kidney disease, stage 3a: Secondary | ICD-10-CM | POA: Diagnosis present

## 2021-08-04 DIAGNOSIS — J188 Other pneumonia, unspecified organism: Secondary | ICD-10-CM

## 2021-08-04 DIAGNOSIS — N179 Acute kidney failure, unspecified: Secondary | ICD-10-CM | POA: Diagnosis not present

## 2021-08-04 DIAGNOSIS — K219 Gastro-esophageal reflux disease without esophagitis: Secondary | ICD-10-CM | POA: Diagnosis present

## 2021-08-04 DIAGNOSIS — J181 Lobar pneumonia, unspecified organism: Secondary | ICD-10-CM | POA: Diagnosis present

## 2021-08-04 DIAGNOSIS — M109 Gout, unspecified: Secondary | ICD-10-CM | POA: Diagnosis present

## 2021-08-04 DIAGNOSIS — N281 Cyst of kidney, acquired: Secondary | ICD-10-CM

## 2021-08-04 DIAGNOSIS — Z7902 Long term (current) use of antithrombotics/antiplatelets: Secondary | ICD-10-CM

## 2021-08-04 DIAGNOSIS — I959 Hypotension, unspecified: Secondary | ICD-10-CM

## 2021-08-04 DIAGNOSIS — F0150 Vascular dementia without behavioral disturbance: Secondary | ICD-10-CM | POA: Diagnosis present

## 2021-08-04 DIAGNOSIS — I480 Paroxysmal atrial fibrillation: Secondary | ICD-10-CM | POA: Diagnosis present

## 2021-08-04 DIAGNOSIS — J90 Pleural effusion, not elsewhere classified: Secondary | ICD-10-CM | POA: Diagnosis present

## 2021-08-04 DIAGNOSIS — Z7951 Long term (current) use of inhaled steroids: Secondary | ICD-10-CM

## 2021-08-04 DIAGNOSIS — Z79899 Other long term (current) drug therapy: Secondary | ICD-10-CM

## 2021-08-04 DIAGNOSIS — F1020 Alcohol dependence, uncomplicated: Secondary | ICD-10-CM | POA: Diagnosis present

## 2021-08-04 DIAGNOSIS — R052 Subacute cough: Secondary | ICD-10-CM

## 2021-08-04 DIAGNOSIS — Z823 Family history of stroke: Secondary | ICD-10-CM

## 2021-08-04 DIAGNOSIS — R296 Repeated falls: Secondary | ICD-10-CM | POA: Diagnosis present

## 2021-08-04 DIAGNOSIS — F419 Anxiety disorder, unspecified: Secondary | ICD-10-CM | POA: Diagnosis present

## 2021-08-04 DIAGNOSIS — Z8249 Family history of ischemic heart disease and other diseases of the circulatory system: Secondary | ICD-10-CM

## 2021-08-04 DIAGNOSIS — Y92009 Unspecified place in unspecified non-institutional (private) residence as the place of occurrence of the external cause: Secondary | ICD-10-CM

## 2021-08-04 DIAGNOSIS — Z8673 Personal history of transient ischemic attack (TIA), and cerebral infarction without residual deficits: Secondary | ICD-10-CM

## 2021-08-04 DIAGNOSIS — Z7982 Long term (current) use of aspirin: Secondary | ICD-10-CM

## 2021-08-04 DIAGNOSIS — J45909 Unspecified asthma, uncomplicated: Secondary | ICD-10-CM | POA: Diagnosis present

## 2021-08-04 DIAGNOSIS — E669 Obesity, unspecified: Secondary | ICD-10-CM | POA: Diagnosis present

## 2021-08-04 DIAGNOSIS — Z7952 Long term (current) use of systemic steroids: Secondary | ICD-10-CM

## 2021-08-04 DIAGNOSIS — I129 Hypertensive chronic kidney disease with stage 1 through stage 4 chronic kidney disease, or unspecified chronic kidney disease: Secondary | ICD-10-CM | POA: Diagnosis present

## 2021-08-04 DIAGNOSIS — E7800 Pure hypercholesterolemia, unspecified: Secondary | ICD-10-CM | POA: Diagnosis present

## 2021-08-04 DIAGNOSIS — I951 Orthostatic hypotension: Secondary | ICD-10-CM | POA: Diagnosis present

## 2021-08-04 DIAGNOSIS — I2510 Atherosclerotic heart disease of native coronary artery without angina pectoris: Secondary | ICD-10-CM | POA: Diagnosis present

## 2021-08-04 DIAGNOSIS — Z20822 Contact with and (suspected) exposure to covid-19: Secondary | ICD-10-CM | POA: Diagnosis present

## 2021-08-04 DIAGNOSIS — W19XXXA Unspecified fall, initial encounter: Secondary | ICD-10-CM | POA: Diagnosis not present

## 2021-08-04 DIAGNOSIS — I341 Nonrheumatic mitral (valve) prolapse: Secondary | ICD-10-CM | POA: Diagnosis present

## 2021-08-04 DIAGNOSIS — Z955 Presence of coronary angioplasty implant and graft: Secondary | ICD-10-CM

## 2021-08-04 DIAGNOSIS — Z8616 Personal history of COVID-19: Secondary | ICD-10-CM

## 2021-08-04 DIAGNOSIS — J189 Pneumonia, unspecified organism: Principal | ICD-10-CM | POA: Diagnosis present

## 2021-08-04 DIAGNOSIS — Z6833 Body mass index (BMI) 33.0-33.9, adult: Secondary | ICD-10-CM

## 2021-08-04 DIAGNOSIS — I252 Old myocardial infarction: Secondary | ICD-10-CM

## 2021-08-04 DIAGNOSIS — R7989 Other specified abnormal findings of blood chemistry: Secondary | ICD-10-CM | POA: Diagnosis present

## 2021-08-04 DIAGNOSIS — E871 Hypo-osmolality and hyponatremia: Secondary | ICD-10-CM | POA: Diagnosis present

## 2021-08-04 DIAGNOSIS — Z950 Presence of cardiac pacemaker: Secondary | ICD-10-CM

## 2021-08-04 DIAGNOSIS — Z888 Allergy status to other drugs, medicaments and biological substances status: Secondary | ICD-10-CM

## 2021-08-04 LAB — MAGNESIUM: Magnesium: 2.3 mg/dL (ref 1.7–2.4)

## 2021-08-04 LAB — COMPREHENSIVE METABOLIC PANEL
ALT: 64 U/L — ABNORMAL HIGH (ref 0–44)
AST: 54 U/L — ABNORMAL HIGH (ref 15–41)
Albumin: 3.3 g/dL — ABNORMAL LOW (ref 3.5–5.0)
Alkaline Phosphatase: 61 U/L (ref 38–126)
Anion gap: 11 (ref 5–15)
BUN: 28 mg/dL — ABNORMAL HIGH (ref 8–23)
CO2: 27 mmol/L (ref 22–32)
Calcium: 9 mg/dL (ref 8.9–10.3)
Chloride: 98 mmol/L (ref 98–111)
Creatinine, Ser: 1.51 mg/dL — ABNORMAL HIGH (ref 0.61–1.24)
GFR, Estimated: 50 mL/min — ABNORMAL LOW (ref >60)
Glucose, Bld: 81 mg/dL (ref 70–99)
Potassium: 4.8 mmol/L (ref 3.5–5.1)
Sodium: 136 mmol/L (ref 135–145)
Total Bilirubin: 0.9 mg/dL (ref 0.3–1.2)
Total Protein: 6.3 g/dL — ABNORMAL LOW (ref 6.5–8.1)

## 2021-08-04 LAB — CBC WITH DIFFERENTIAL/PLATELET
Abs Immature Granulocytes: 0.32 10*3/uL — ABNORMAL HIGH (ref 0.00–0.07)
Basophils Absolute: 0.1 10*3/uL (ref 0.0–0.1)
Basophils Relative: 1 %
Eosinophils Absolute: 0.1 10*3/uL (ref 0.0–0.5)
Eosinophils Relative: 1 %
HCT: 45.3 % (ref 39.0–52.0)
Hemoglobin: 14.6 g/dL (ref 13.0–17.0)
Immature Granulocytes: 2 %
Lymphocytes Relative: 25 %
Lymphs Abs: 3.3 10*3/uL (ref 0.7–4.0)
MCH: 33.4 pg (ref 26.0–34.0)
MCHC: 32.2 g/dL (ref 30.0–36.0)
MCV: 103.7 fL — ABNORMAL HIGH (ref 80.0–100.0)
Monocytes Absolute: 1.5 10*3/uL — ABNORMAL HIGH (ref 0.1–1.0)
Monocytes Relative: 11 %
Neutro Abs: 7.9 10*3/uL — ABNORMAL HIGH (ref 1.7–7.7)
Neutrophils Relative %: 60 %
Platelets: 237 10*3/uL (ref 150–400)
RBC: 4.37 MIL/uL (ref 4.22–5.81)
RDW: 14.2 % (ref 11.5–15.5)
WBC: 13.1 10*3/uL — ABNORMAL HIGH (ref 4.0–10.5)
nRBC: 0.2 % (ref 0.0–0.2)

## 2021-08-04 LAB — LACTIC ACID, PLASMA
Lactic Acid, Venous: 2.2 mmol/L (ref 0.5–1.9)
Lactic Acid, Venous: 2.2 mmol/L (ref 0.5–1.9)
Lactic Acid, Venous: 1.8 mmol/L (ref 0.5–1.9)

## 2021-08-04 LAB — RESP PANEL BY RT-PCR (FLU A&B, COVID) ARPGX2
Influenza A by PCR: NEGATIVE
Influenza B by PCR: NEGATIVE
SARS Coronavirus 2 by RT PCR: NEGATIVE

## 2021-08-04 LAB — URINALYSIS, ROUTINE W REFLEX MICROSCOPIC
Bilirubin Urine: NEGATIVE
Glucose, UA: NEGATIVE mg/dL
Hgb urine dipstick: NEGATIVE
Ketones, ur: NEGATIVE mg/dL
Leukocytes,Ua: NEGATIVE
Nitrite: NEGATIVE
Protein, ur: NEGATIVE mg/dL
Specific Gravity, Urine: 1.024 (ref 1.005–1.030)
pH: 6 (ref 5.0–8.0)

## 2021-08-04 LAB — I-STAT CHEM 8, ED
BUN: 32 mg/dL — ABNORMAL HIGH (ref 8–23)
Calcium, Ion: 1.05 mmol/L — ABNORMAL LOW (ref 1.15–1.40)
Chloride: 99 mmol/L (ref 98–111)
Creatinine, Ser: 1.4 mg/dL — ABNORMAL HIGH (ref 0.61–1.24)
Glucose, Bld: 82 mg/dL (ref 70–99)
HCT: 46 % (ref 39.0–52.0)
Hemoglobin: 15.6 g/dL (ref 13.0–17.0)
Potassium: 4.5 mmol/L (ref 3.5–5.1)
Sodium: 133 mmol/L — ABNORMAL LOW (ref 135–145)
TCO2: 29 mmol/L (ref 22–32)

## 2021-08-04 LAB — TROPONIN I (HIGH SENSITIVITY)
Troponin I (High Sensitivity): 24 ng/L — ABNORMAL HIGH (ref <18)
Troponin I (High Sensitivity): 20 ng/L — ABNORMAL HIGH (ref <18)

## 2021-08-04 LAB — BRAIN NATRIURETIC PEPTIDE: B Natriuretic Peptide: 210 pg/mL — ABNORMAL HIGH (ref 0.0–100.0)

## 2021-08-04 IMAGING — CT CT CERVICAL SPINE W/O CM
3 of 4 series · 12 of 33 positions shown, 14 images · non-contrast
Comparison: [DATE] CT head and CTA head and neck, [DATE] CT
cervical spine

CLINICAL DATA: Fall



[Series 5: c_spine 2.0 3 (person_name) (person_name) · axial · 0.33mm/px · z∈[-268,-144]mm · 4 of 94 slices shown, 5 images]
[im 16/94  soft-tissue]
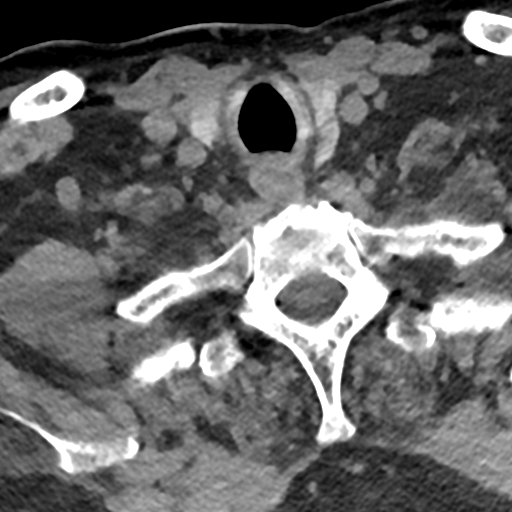
[im 16/94  bone]
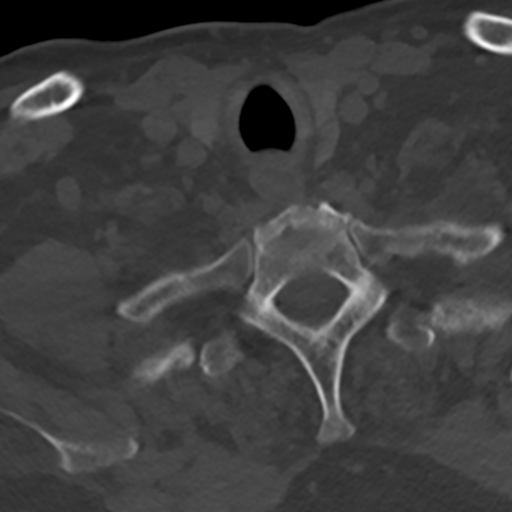
[im 32/94  bone]
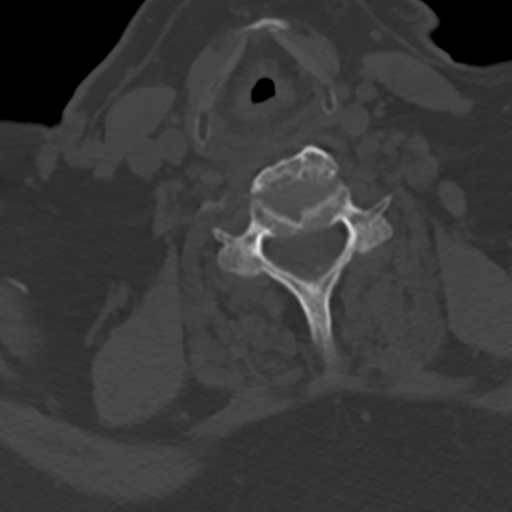
[im 63/94  bone]
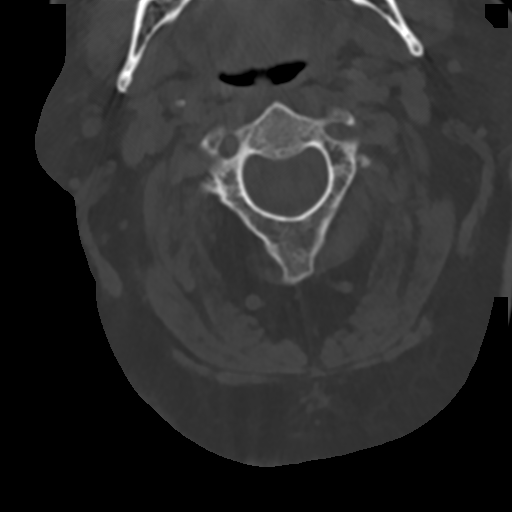
[im 78/94  bone]
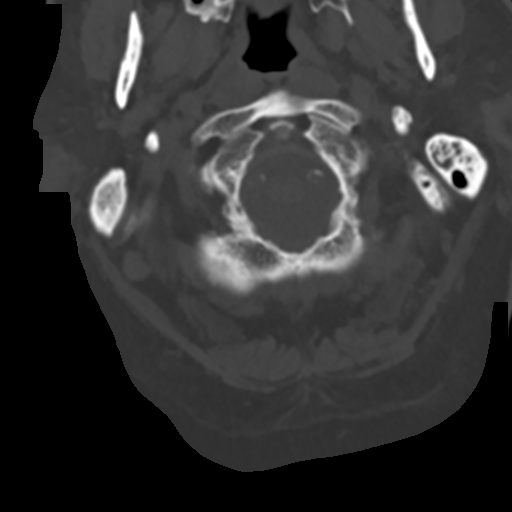

[Series 6: coronal bone · coronal · 0.27mm/px · 3 of 82 slices shown]
[im 17/82  bone]
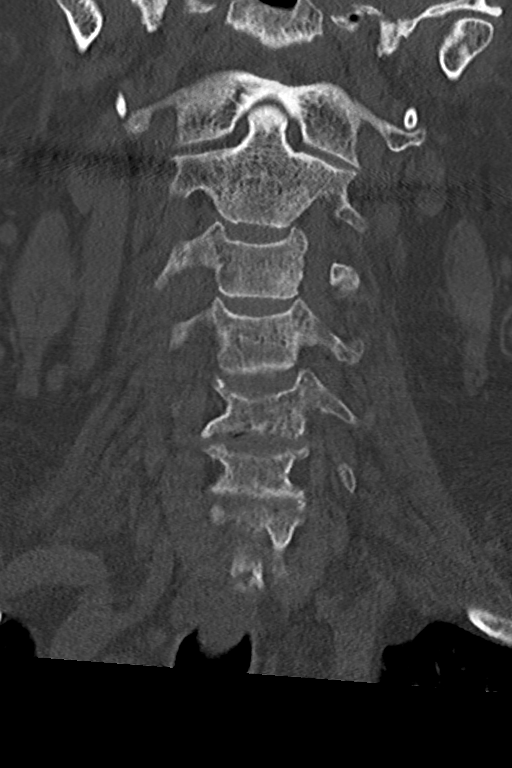
[im 33/82  bone]
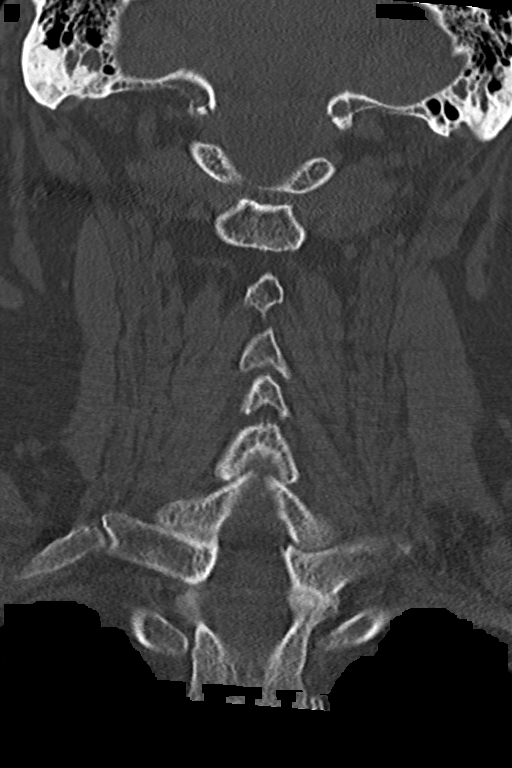
[im 49/82  bone]
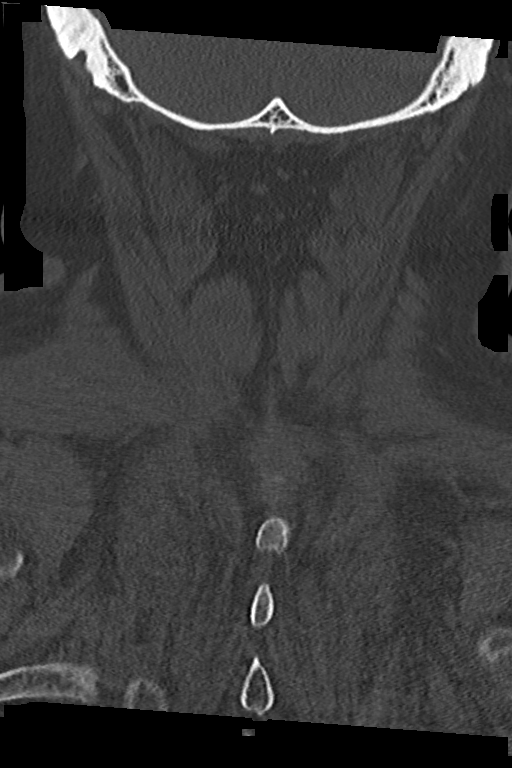

[Series 7: sagittal bone · sagittal · 0.35mm/px · 5 of 70 slices shown, 6 images]
[im 24/70  bone]
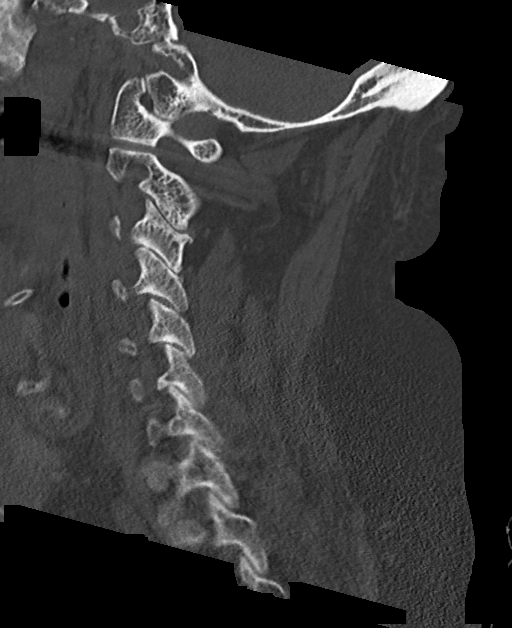
[im 29/70  bone]
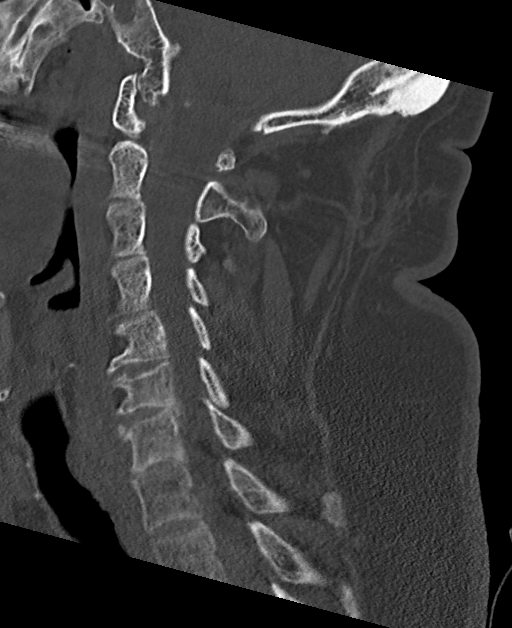
[im 35/70  soft-tissue]
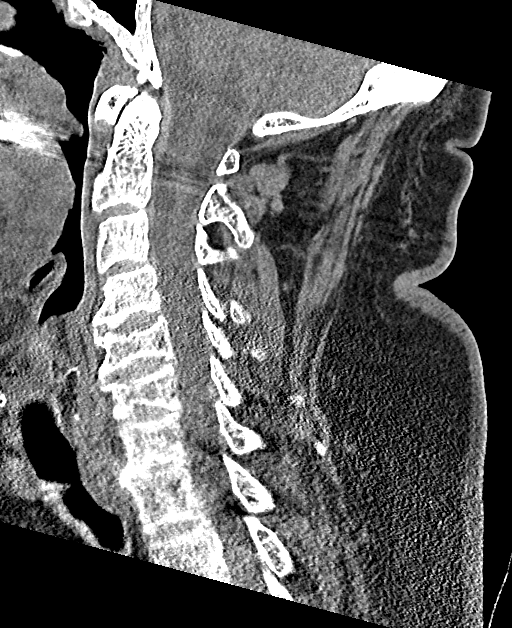
[im 35/70  bone]
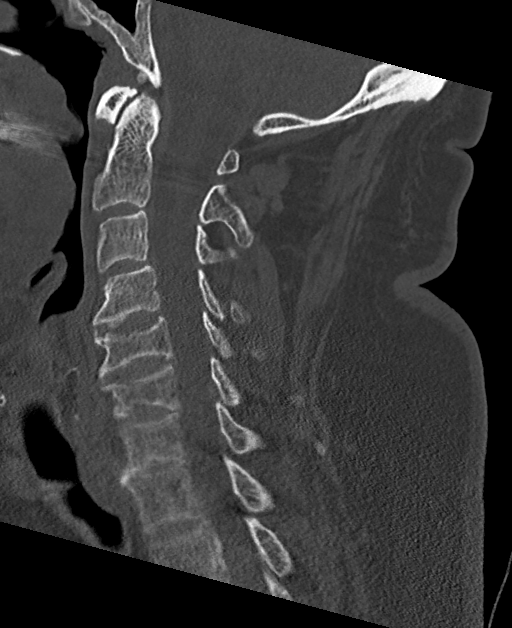
[im 41/70  bone]
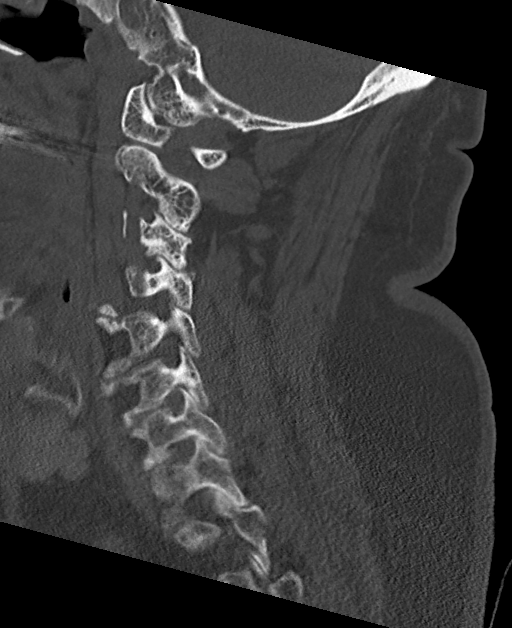
[im 47/70  bone]
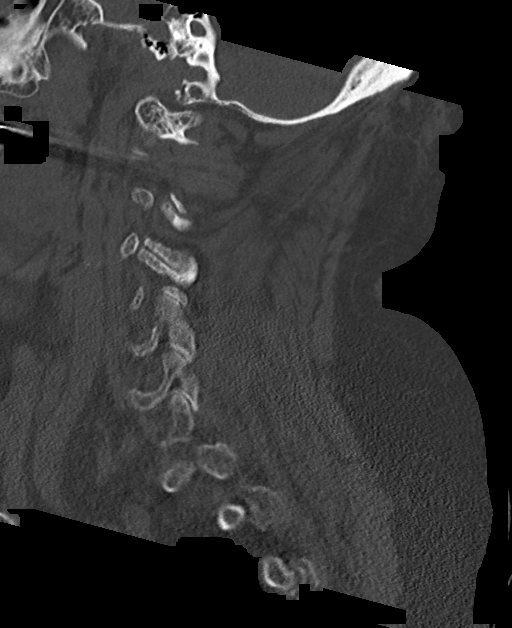

[12 of 33 positions shown; findings below may reference images not displayed]

FINDINGS: CT HEAD FINDINGS

Brain: No evidence of acute infarction, hemorrhage, cerebral edema,
mass, mass effect, or midline shift. No hydrocephalus or extra-axial
fluid collection. Redemonstrated encephalomalacia in the anterior
frontal lobes. Periventricular white matter changes, likely the
sequela of chronic small vessel ischemic disease. Left caudate and
right corona radiata lacunar infarcts.

Vascular: No hyperdense vessel. Atherosclerotic calcifications in
the intracranial carotid and vertebral arteries.

Skull: Prior frontal craniotomy. Negative for fracture or focal
lesion.

Other: None.

CT MAXILLOFACIAL FINDINGS

Osseous: No fracture or mandibular dislocation. No destructive
process.

Orbits: No acute finding. Unchanged irregularity of the medial
orbital walls.

Sinuses: Unchanged apparent osseous defect in the anterior wall of
the right maxillary sinus. Irregularity of the bilateral lamina
papyracea. Redemonstrated diffuse osseous thickening, most
prominently in the maxillary sinuses, with postsurgical changes
including left maxillary antrostomy, partial ethmoidectomies, and
bilateral sphenoidotomies. Mild mucosal thickening throughout the
paranasal sinuses.

Soft tissues: Negative.

CT CERVICAL SPINE FINDINGS

Alignment: No listhesis.

Skull base and vertebrae: No acute fracture. No primary bone lesion
or focal pathologic process. Redemonstrated chronic vertebral body
height loss in C5 and C6.

Soft tissues and spinal canal: No prevertebral fluid or swelling. No
visible canal hematoma.

Disc levels: Multilevel degenerative changes, with mild spinal canal
stenosis at C6-C7. Multilevel uncovertebral and facet arthropathy.

Upper chest: No focal pulmonary opacity or pleural effusion.

Other: None.
IMPRESSION: 1.  No acute intracranial process.
2.  No acute fracture or traumatic listhesis in the cervical spine.
3. No acute facial bone fracture.

## 2021-08-04 IMAGING — DX DG CHEST 1V PORT
1 series · 1 of 1 positions shown · non-contrast
Comparison: [DATE]

CLINICAL DATA: Trauma, fall

EXAM:
PORTABLE CHEST 1 VIEW

[chest]
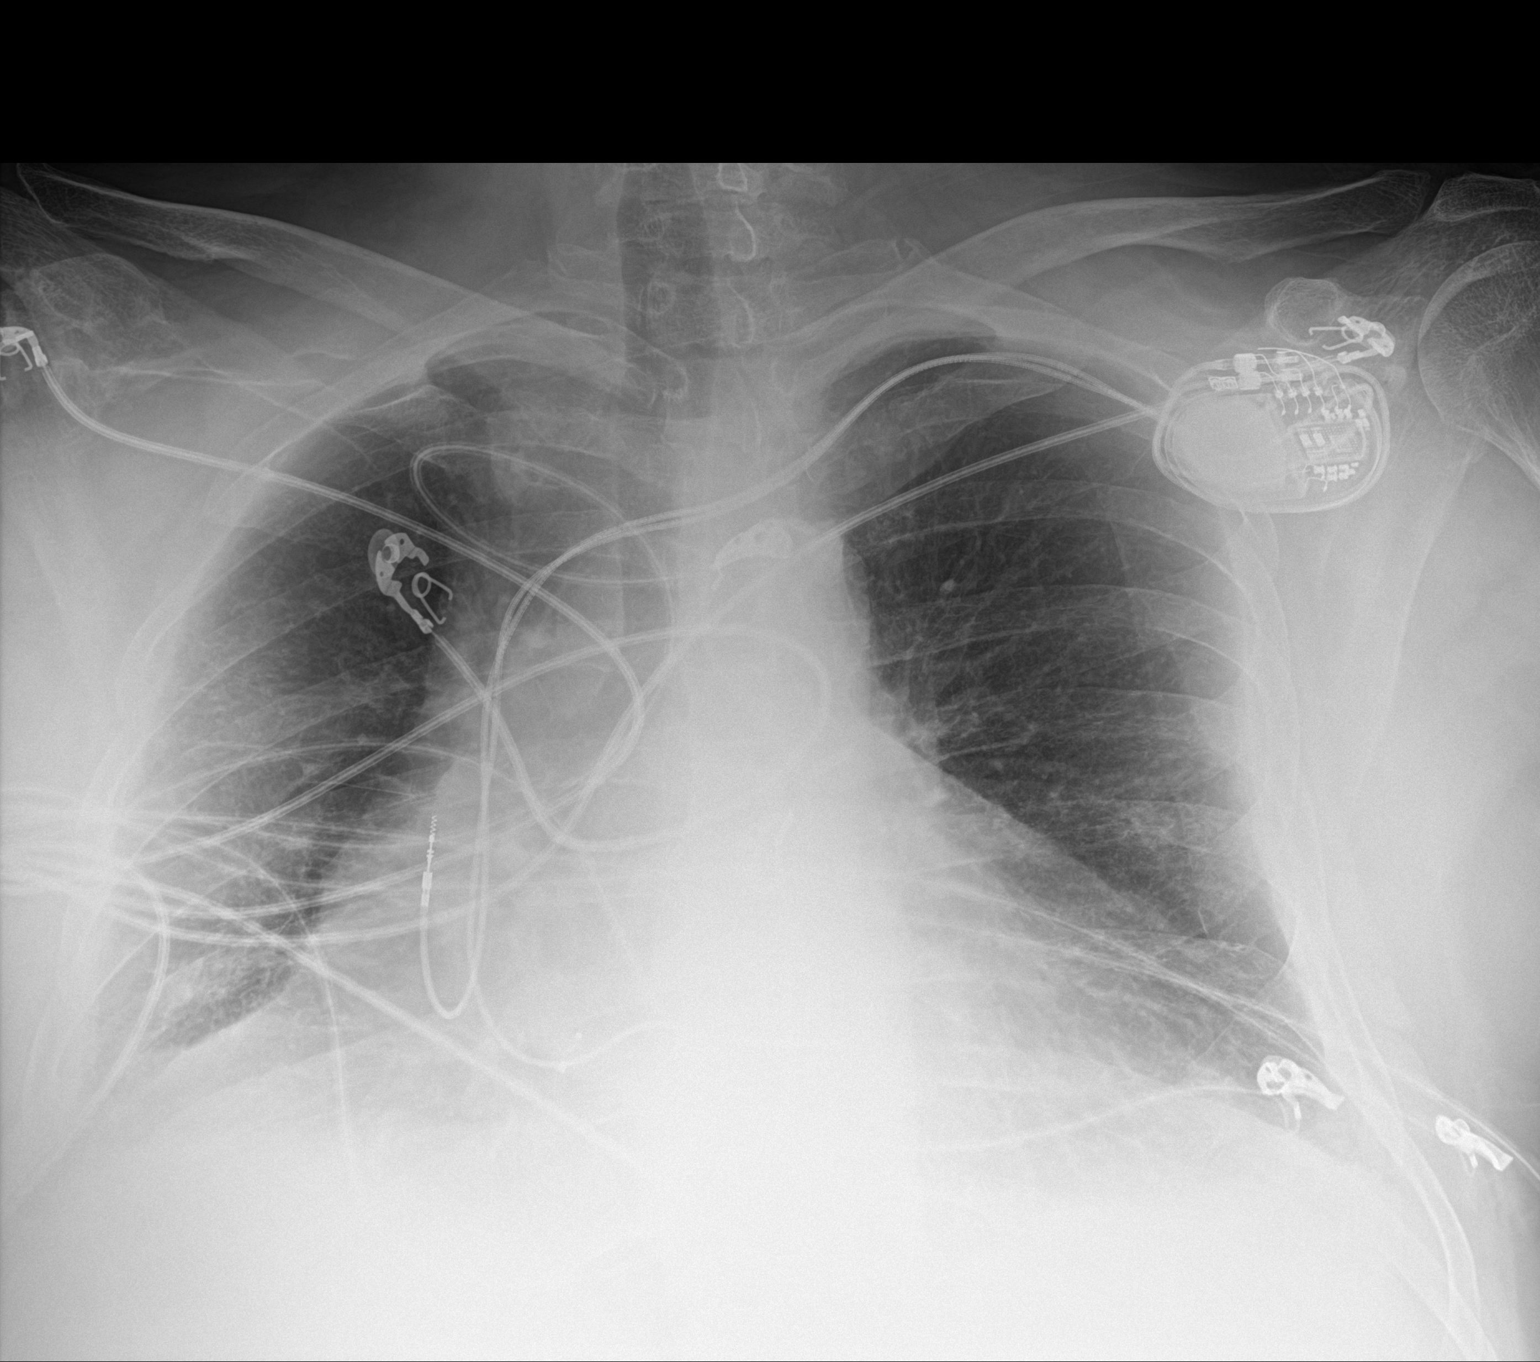

[1 of 1 positions shown; findings below may reference images not displayed]

FINDINGS: Transverse diameter of heart is increased. Pacemaker battery is seen
in the left infraclavicular region. There are no signs of alveolar
pulmonary edema or focal pulmonary consolidation. There is poor
inspiration. Lateral CP angles are indistinct. There is no
pneumothorax.
IMPRESSION: Cardiomegaly. There are no signs of alveolar pulmonary edema or
focal pulmonary consolidation. Small bilateral pleural effusions.

## 2021-08-04 IMAGING — CT CT MAXILLOFACIAL W/O CM
4 series · 16 of 47 positions shown, 18 images · non-contrast
Comparison: [DATE] CT head and CTA head and neck, [DATE] CT
cervical spine

CLINICAL DATA: Fall



[Series 3: facial/ orbits 2.0 (person_name)30(person_name) (p · axial · 0.39mm/px · z∈[-214,-72]mm · 8 of 93 slices shown, 10 images]
[im 11/93  brain]
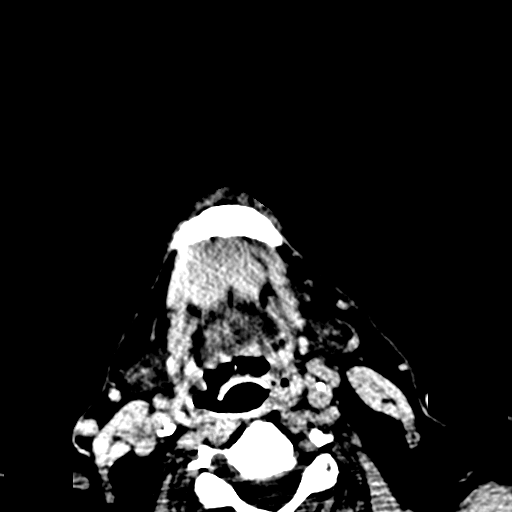
[im 11/93  bone]
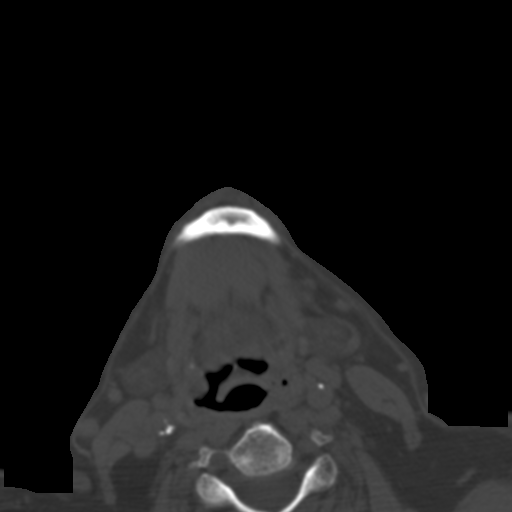
[im 21/93  bone]
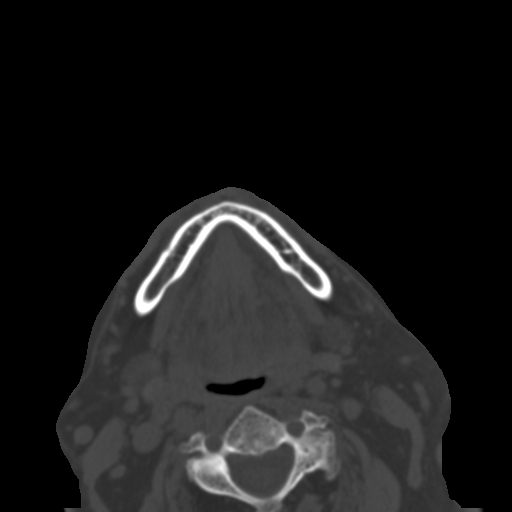
[im 31/93  bone]
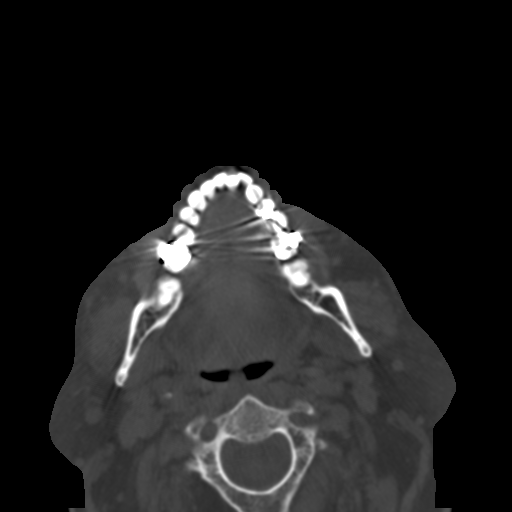
[im 41/93  bone]
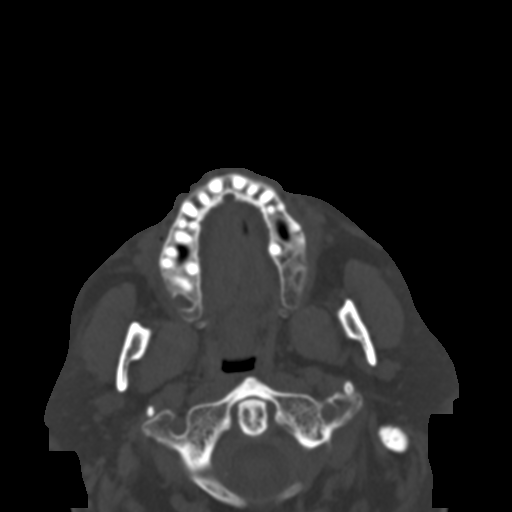
[im 52/93  brain]
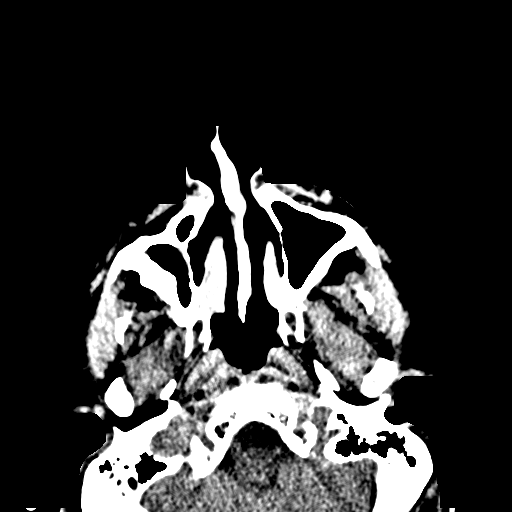
[im 52/93  bone]
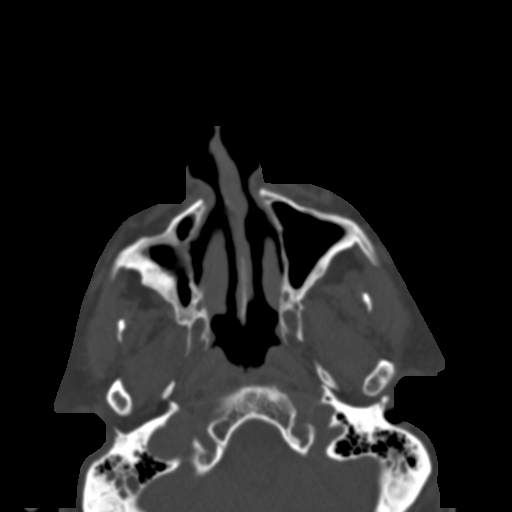
[im 62/93  bone]
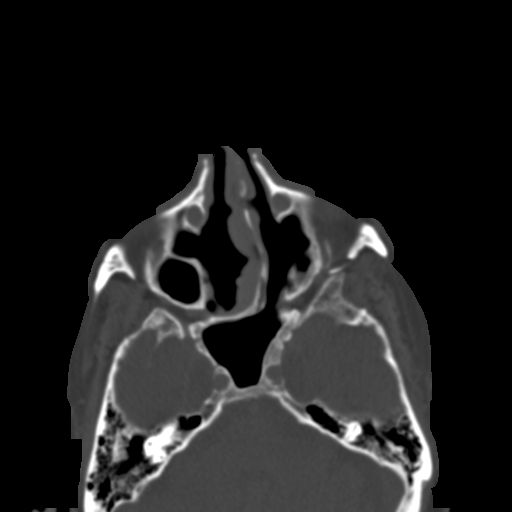
[im 72/93  bone]
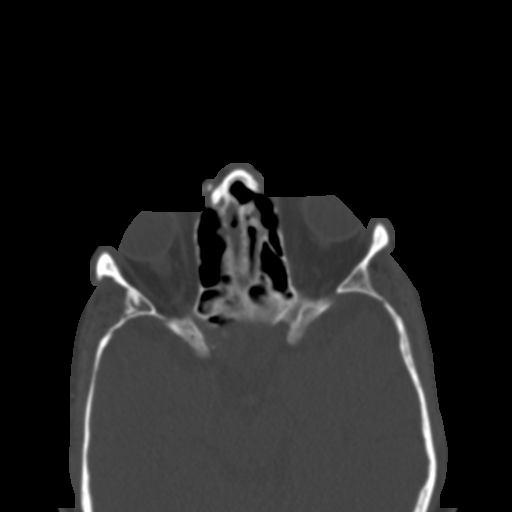
[im 82/93  bone]
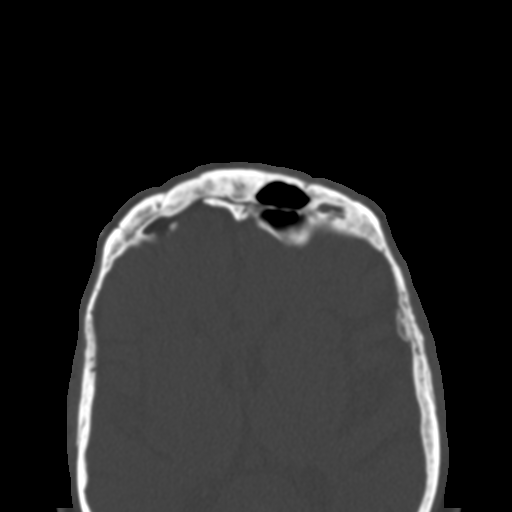

[Series 5: 1.0 thin soft tissue (person_name) · axial · 0.39mm/px · z∈[-216,-196]mm · 2 of 186 slices shown]
[im 20/186  brain]
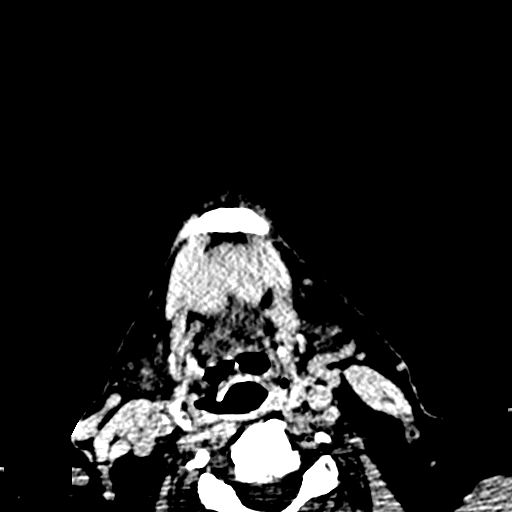
[im 39/186  brain]
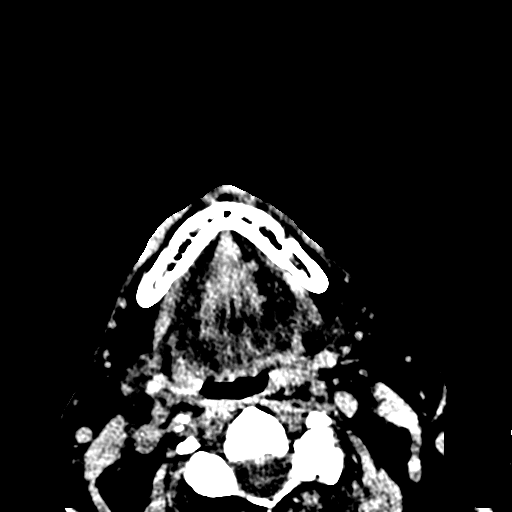

[Series 7: coronal soft tissue · coronal · 0.42mm/px · 3 of 81 slices shown]
[im 27/81  bone]
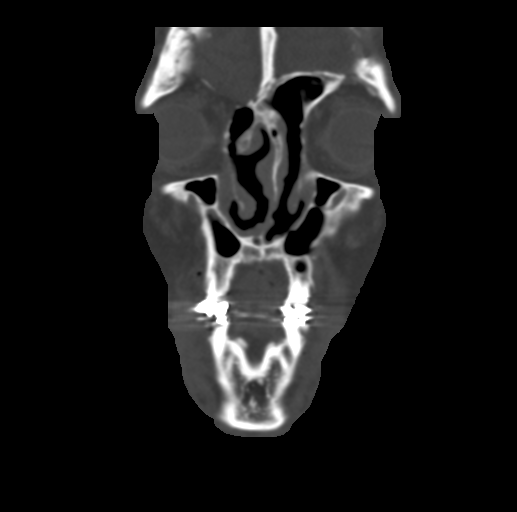
[im 36/81  bone]
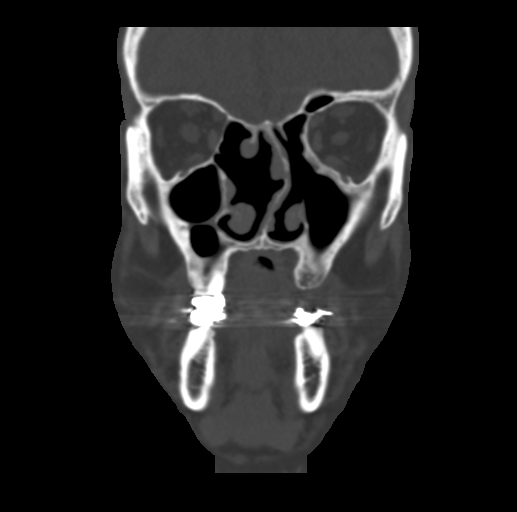
[im 45/81  bone]
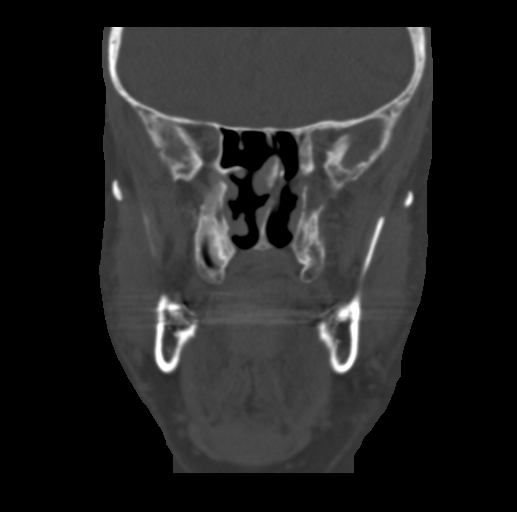

[Series 8: sagittal soft tissue · sagittal · 0.35mm/px · 3 of 103 slices shown]
[im 35/103  bone]
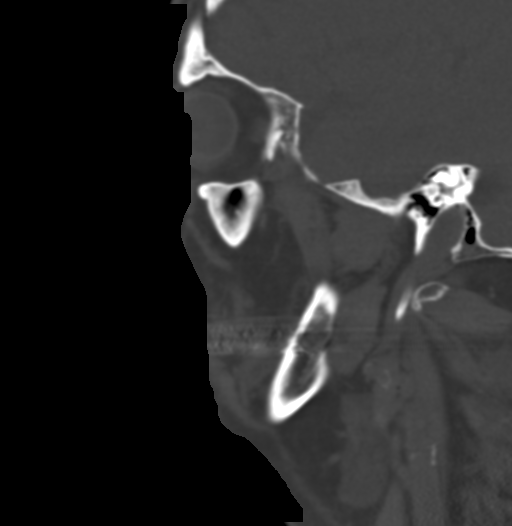
[im 52/103  bone]
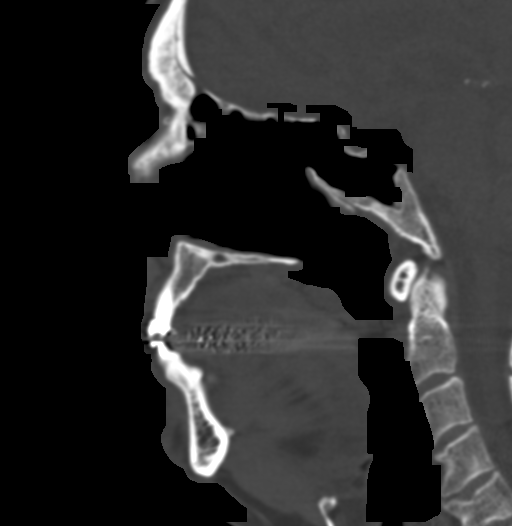
[im 69/103  bone]
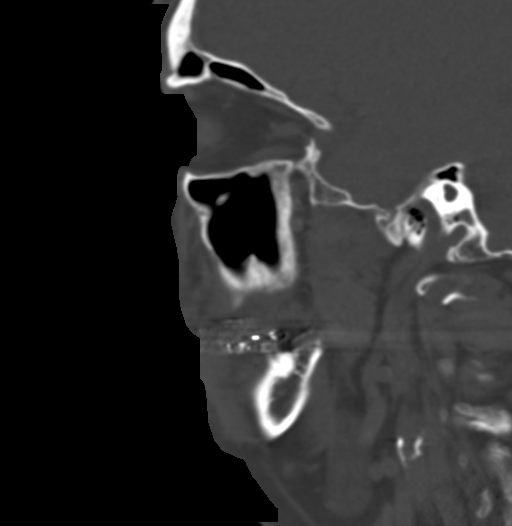

[16 of 47 positions shown; findings below may reference images not displayed]

FINDINGS: CT HEAD FINDINGS

Brain: No evidence of acute infarction, hemorrhage, cerebral edema,
mass, mass effect, or midline shift. No hydrocephalus or extra-axial
fluid collection. Redemonstrated encephalomalacia in the anterior
frontal lobes. Periventricular white matter changes, likely the
sequela of chronic small vessel ischemic disease. Left caudate and
right corona radiata lacunar infarcts.

Vascular: No hyperdense vessel. Atherosclerotic calcifications in
the intracranial carotid and vertebral arteries.

Skull: Prior frontal craniotomy. Negative for fracture or focal
lesion.

Other: None.

CT MAXILLOFACIAL FINDINGS

Osseous: No fracture or mandibular dislocation. No destructive
process.

Orbits: No acute finding. Unchanged irregularity of the medial
orbital walls.

Sinuses: Unchanged apparent osseous defect in the anterior wall of
the right maxillary sinus. Irregularity of the bilateral lamina
papyracea. Redemonstrated diffuse osseous thickening, most
prominently in the maxillary sinuses, with postsurgical changes
including left maxillary antrostomy, partial ethmoidectomies, and
bilateral sphenoidotomies. Mild mucosal thickening throughout the
paranasal sinuses.

Soft tissues: Negative.

CT CERVICAL SPINE FINDINGS

Alignment: No listhesis.

Skull base and vertebrae: No acute fracture. No primary bone lesion
or focal pathologic process. Redemonstrated chronic vertebral body
height loss in C5 and C6.

Soft tissues and spinal canal: No prevertebral fluid or swelling. No
visible canal hematoma.

Disc levels: Multilevel degenerative changes, with mild spinal canal
stenosis at C6-C7. Multilevel uncovertebral and facet arthropathy.

Upper chest: No focal pulmonary opacity or pleural effusion.

Other: None.
IMPRESSION: 1.  No acute intracranial process.
2.  No acute fracture or traumatic listhesis in the cervical spine.
3. No acute facial bone fracture.

## 2021-08-04 IMAGING — CT CT HEAD W/O CM
3 of 5 series · 14 of 47 positions shown, 16 images · non-contrast
Comparison: [DATE] CT head and CTA head and neck, [DATE] CT
cervical spine

CLINICAL DATA: Fall



[Series 4: head 2.0 h70h · axial · 0.48mm/px · z∈[-126,+24]mm · 8 of 87 slices shown, 10 images]
[im 6/87  brain]
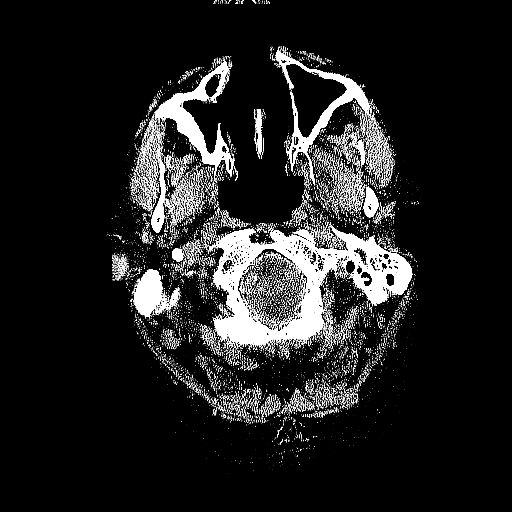
[im 6/87  bone]
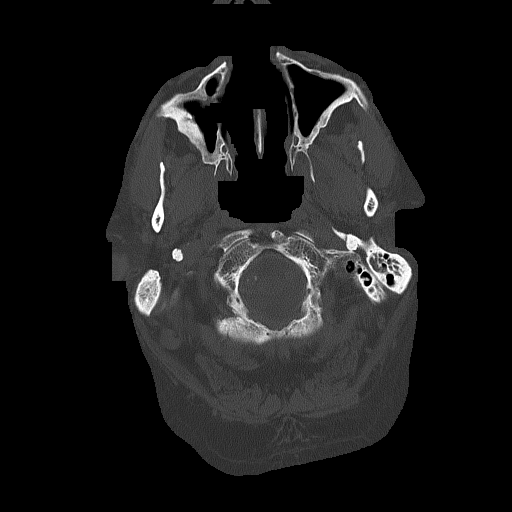
[im 17/87  brain]
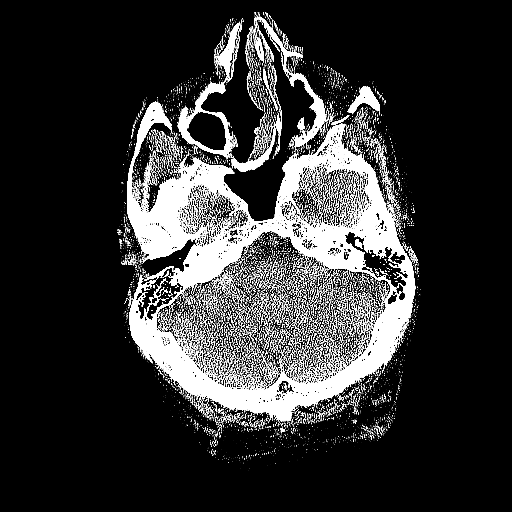
[im 27/87  brain]
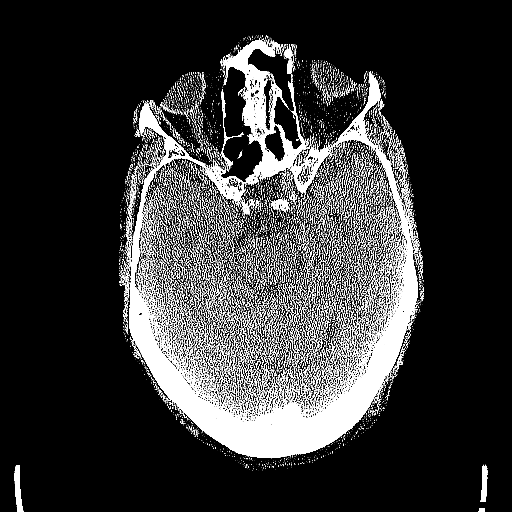
[im 38/87  brain]
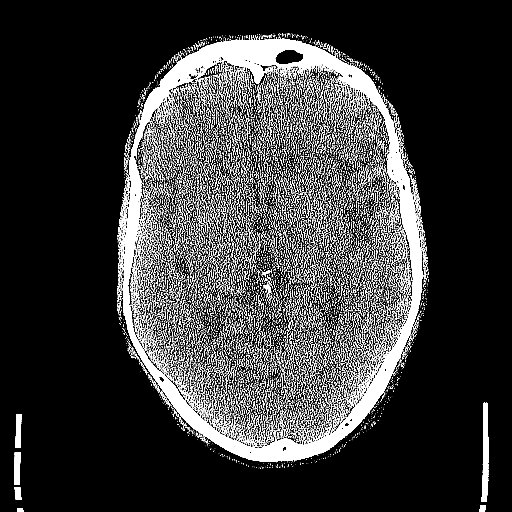
[im 49/87  brain]
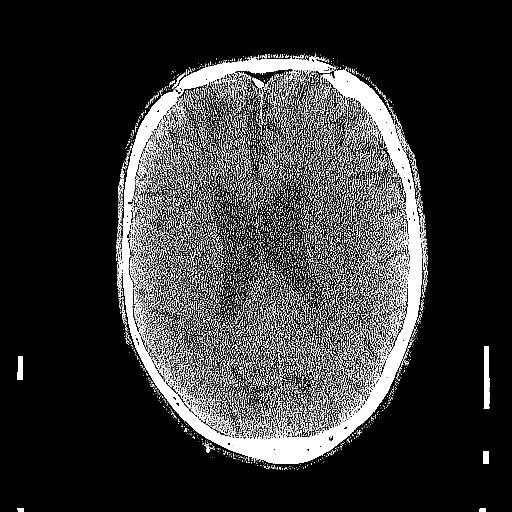
[im 49/87  bone]
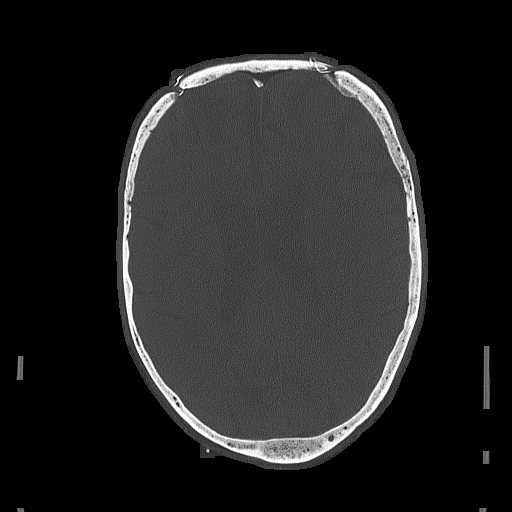
[im 60/87  brain]
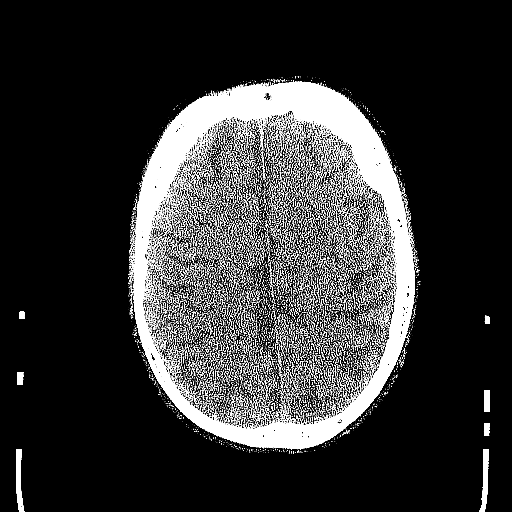
[im 70/87  brain]
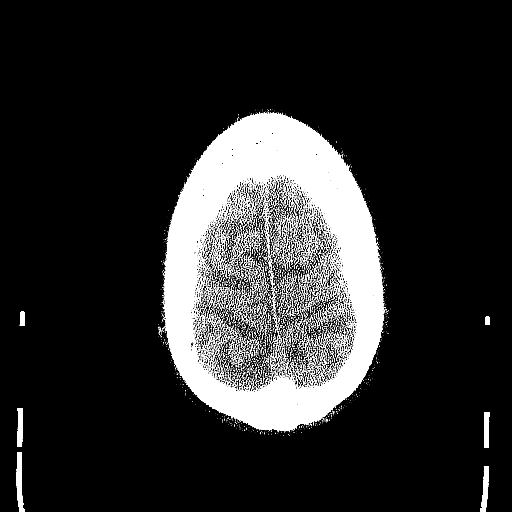
[im 81/87  brain]
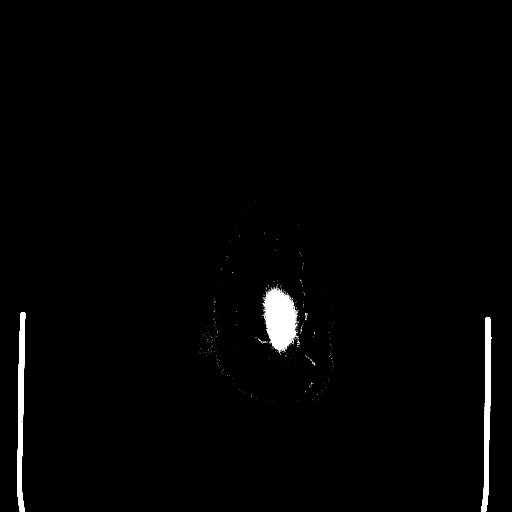

[Series 5: head 3.0 mpr cor · coronal · 0.35mm/px · 3 of 75 slices shown]
[im 25/75  brain]
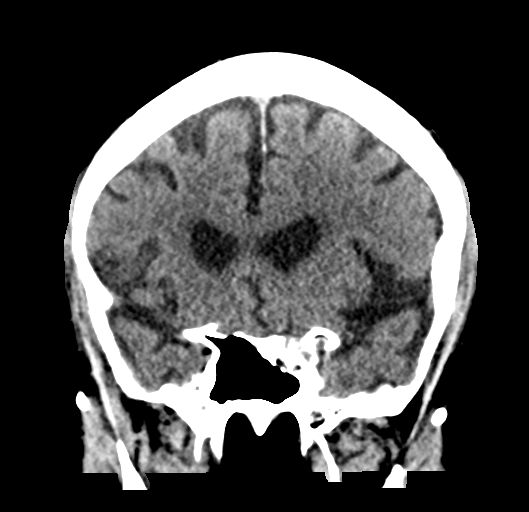
[im 33/75  brain]
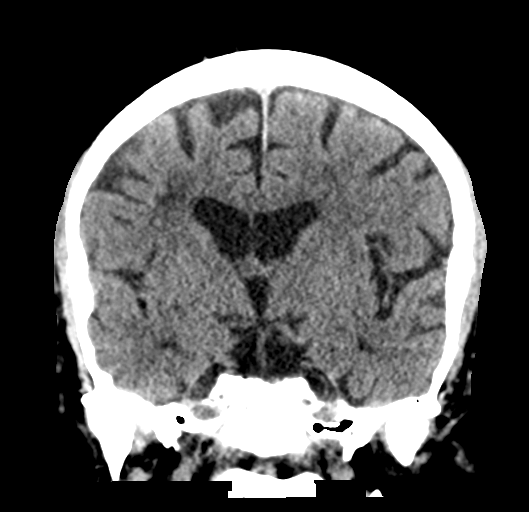
[im 42/75  brain]
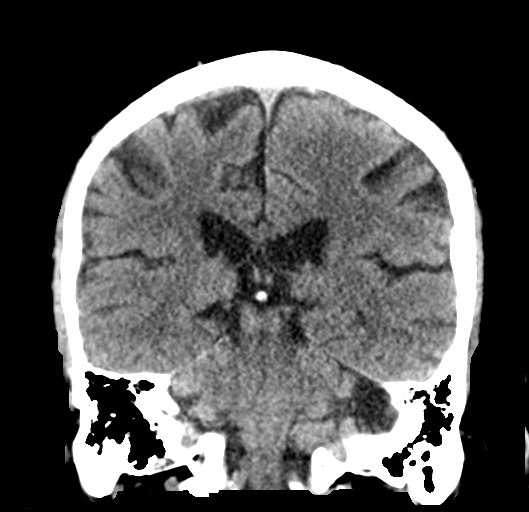

[Series 6: head 3.0 mpr sag · sagittal · 0.34mm/px · 3 of 67 slices shown]
[im 23/67  brain]
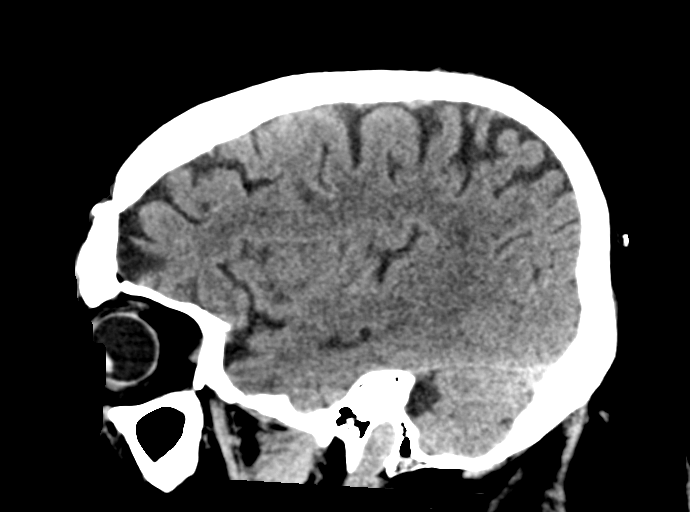
[im 34/67  brain]
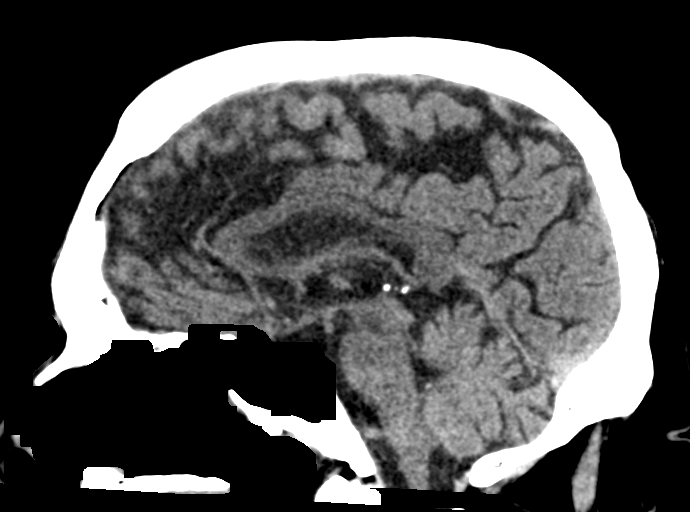
[im 45/67  brain]
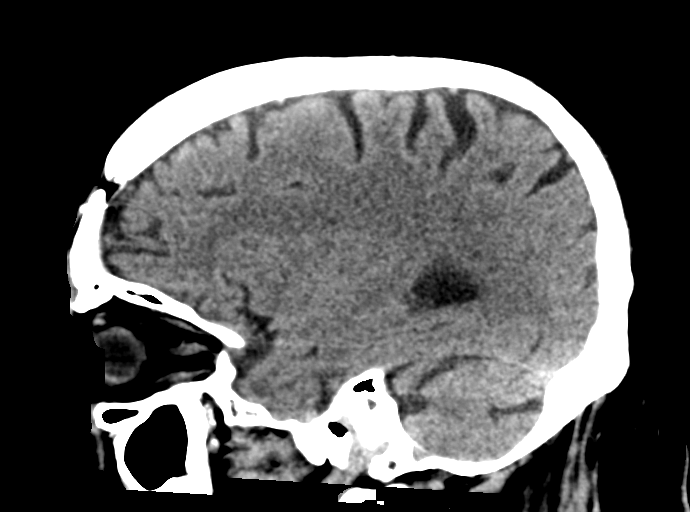

[14 of 47 positions shown; findings below may reference images not displayed]

FINDINGS: CT HEAD FINDINGS

Brain: No evidence of acute infarction, hemorrhage, cerebral edema,
mass, mass effect, or midline shift. No hydrocephalus or extra-axial
fluid collection. Redemonstrated encephalomalacia in the anterior
frontal lobes. Periventricular white matter changes, likely the
sequela of chronic small vessel ischemic disease. Left caudate and
right corona radiata lacunar infarcts.

Vascular: No hyperdense vessel. Atherosclerotic calcifications in
the intracranial carotid and vertebral arteries.

Skull: Prior frontal craniotomy. Negative for fracture or focal
lesion.

Other: None.

CT MAXILLOFACIAL FINDINGS

Osseous: No fracture or mandibular dislocation. No destructive
process.

Orbits: No acute finding. Unchanged irregularity of the medial
orbital walls.

Sinuses: Unchanged apparent osseous defect in the anterior wall of
the right maxillary sinus. Irregularity of the bilateral lamina
papyracea. Redemonstrated diffuse osseous thickening, most
prominently in the maxillary sinuses, with postsurgical changes
including left maxillary antrostomy, partial ethmoidectomies, and
bilateral sphenoidotomies. Mild mucosal thickening throughout the
paranasal sinuses.

Soft tissues: Negative.

CT CERVICAL SPINE FINDINGS

Alignment: No listhesis.

Skull base and vertebrae: No acute fracture. No primary bone lesion
or focal pathologic process. Redemonstrated chronic vertebral body
height loss in C5 and C6.

Soft tissues and spinal canal: No prevertebral fluid or swelling. No
visible canal hematoma.

Disc levels: Multilevel degenerative changes, with mild spinal canal
stenosis at C6-C7. Multilevel uncovertebral and facet arthropathy.

Upper chest: No focal pulmonary opacity or pleural effusion.

Other: None.
IMPRESSION: 1.  No acute intracranial process.
2.  No acute fracture or traumatic listhesis in the cervical spine.
3. No acute facial bone fracture.

## 2021-08-04 IMAGING — CT CT ANGIO CHEST
2 of 6 series · 18 of 46 positions shown · IV contrast (agent unspecified)
Comparison: Chest CT [DATE]. CT angiogram head and neck
[DATE].

CLINICAL DATA: Fall.  PE suspected.

EXAM:
CT ANGIOGRAPHY CHEST WITH CONTRAST
TECHNIQUE: Multidetector CT imaging of the chest was performed using the
standard protocol during bolus administration of intravenous
contrast. Multiplanar CT image reconstructions and MIPs were
obtained to evaluate the vascular anatomy.

[Series 6: thins · axial · 0.98mm/px · z∈[-557,-233]mm · 15 of 356 slices shown]
[im 16/356  lung]
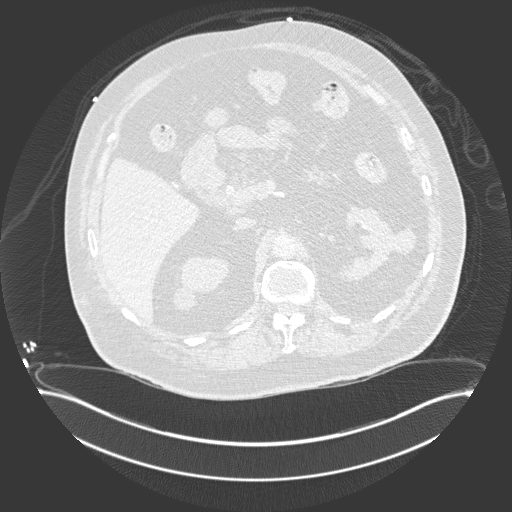
[im 47/356  soft-tissue]
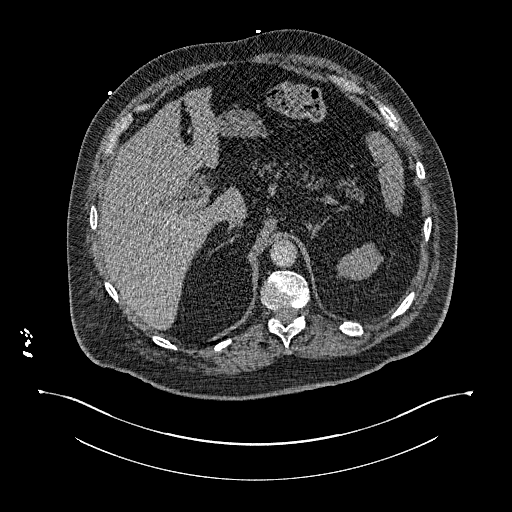
[im 62/356  lung]
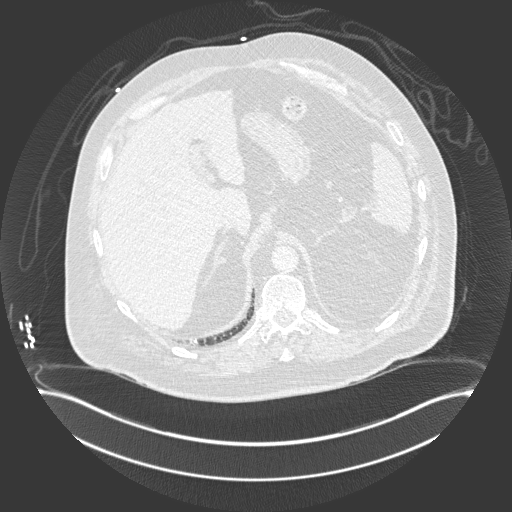
[im 93/356  soft-tissue]
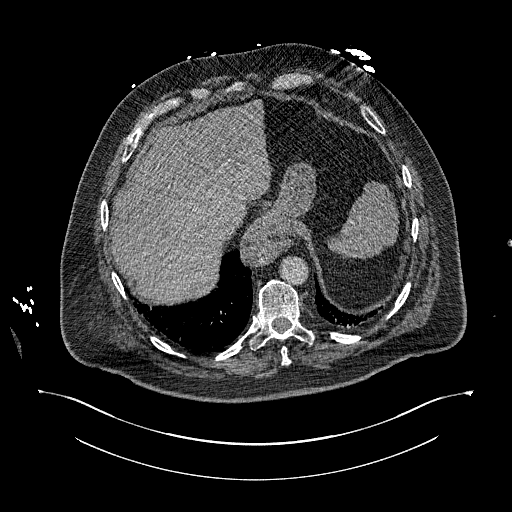
[im 109/356  lung]
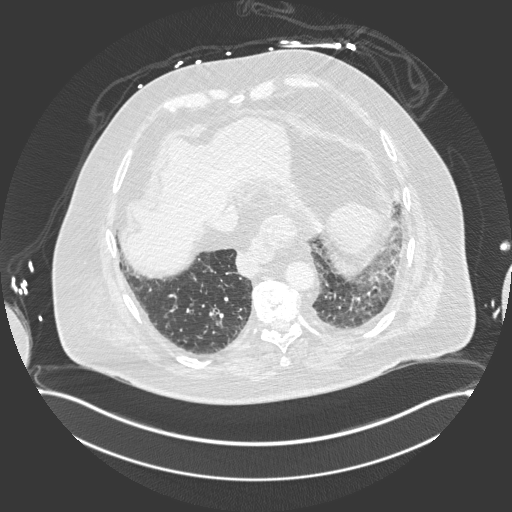
[im 139/356  soft-tissue]
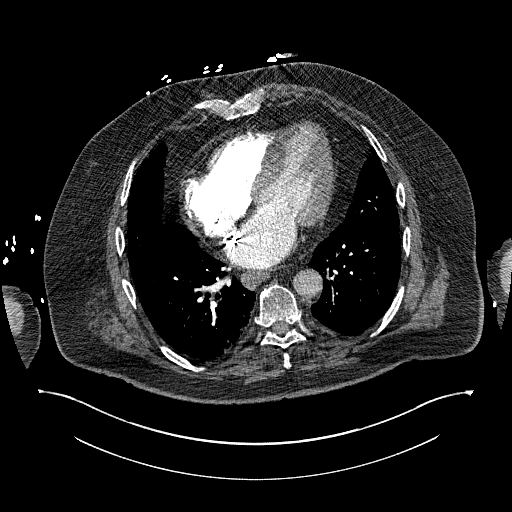
[im 155/356  lung]
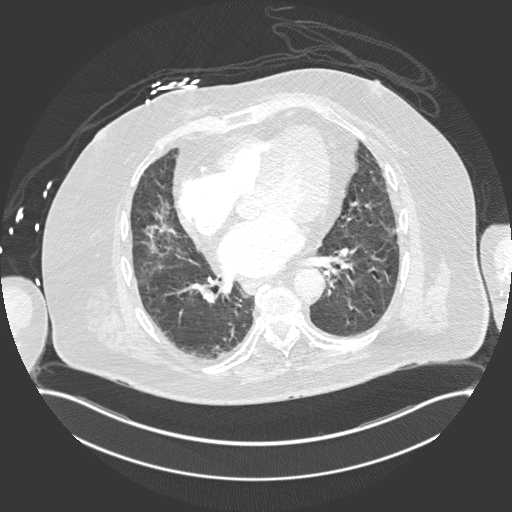
[im 186/356  soft-tissue]
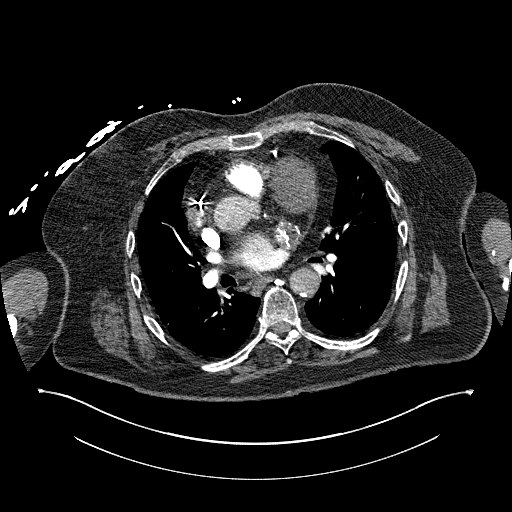
[im 201/356  lung]
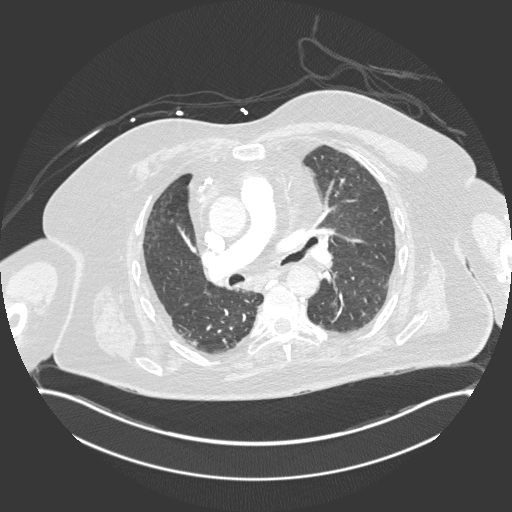
[im 217/356  soft-tissue]
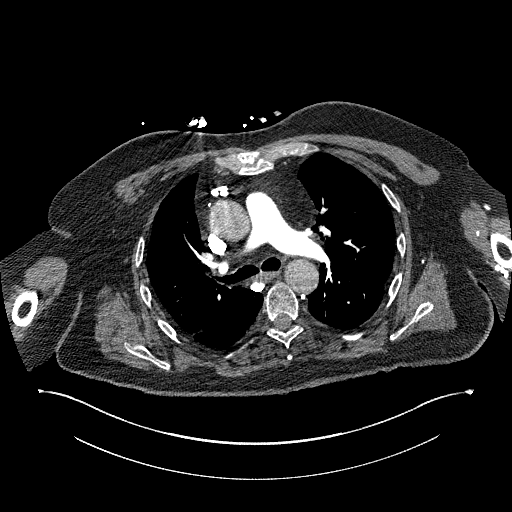
[im 247/356  lung]
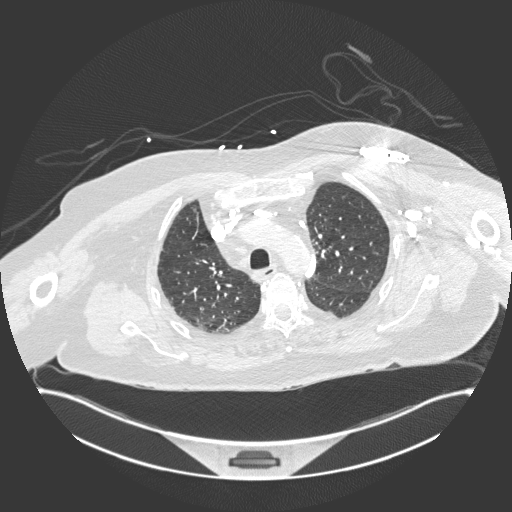
[im 263/356  soft-tissue]
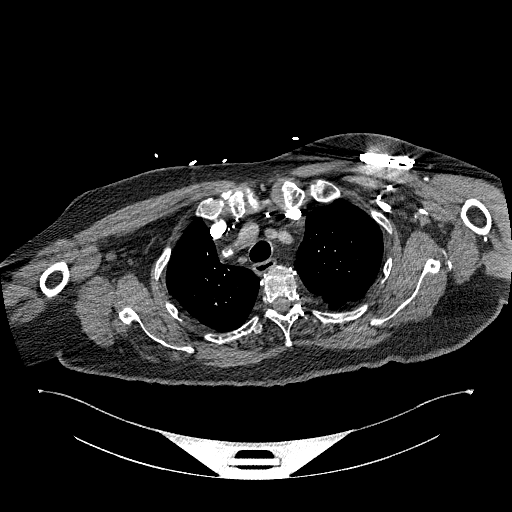
[im 294/356  lung]
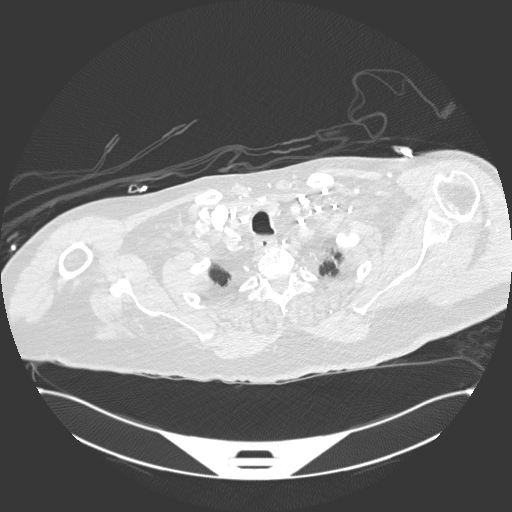
[im 309/356  soft-tissue]
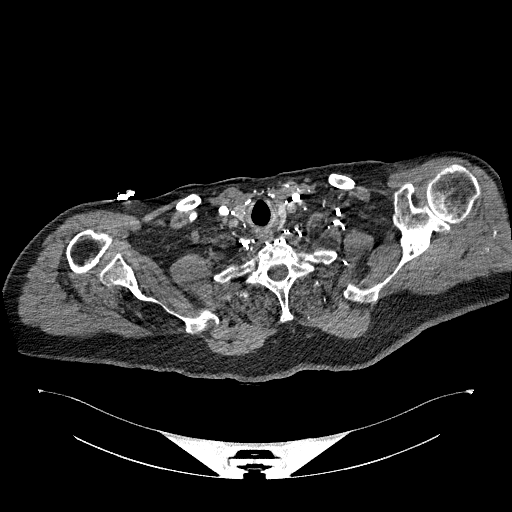
[im 340/356  lung]
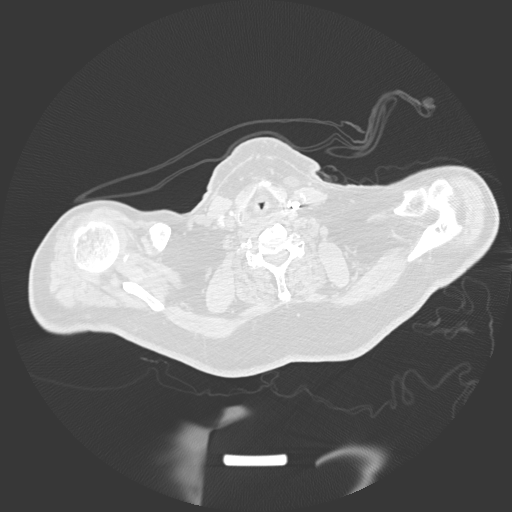

[Series 8: coronal mpr · coronal · 0.72mm/px · 3 of 188 slices shown]
[im 47/188  soft-tissue]
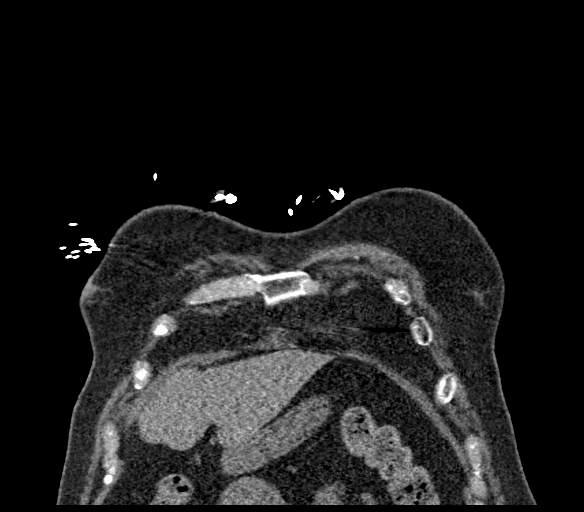
[im 94/188  soft-tissue]
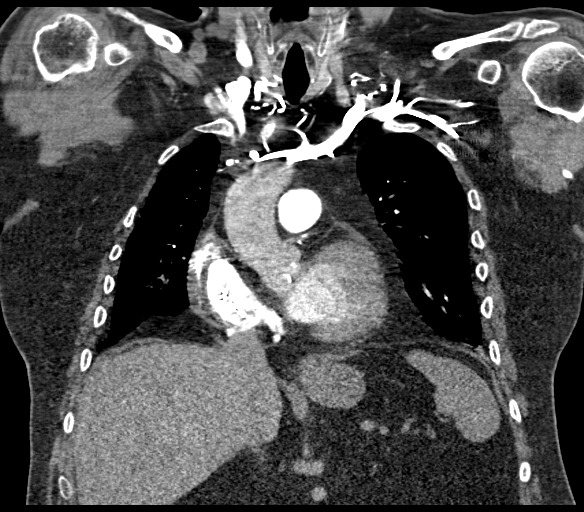
[im 141/188  soft-tissue]
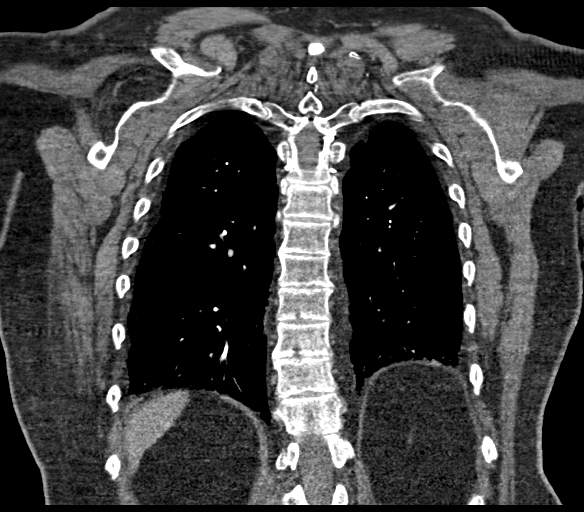

[18 of 46 positions shown; findings below may reference images not displayed]

RADIATION DOSE REDUCTION: This exam was performed according to the
departmental dose-optimization program which includes automated
exposure control, adjustment of the mA and/or kV according to
patient size and/or use of iterative reconstruction technique.

CONTRAST:  65mL OMNIPAQUE IOHEXOL 350 MG/ML SOLN
FINDINGS: Cardiovascular: Heart is mildly enlarged. There is no pericardial
effusion. Aorta is normal in size. There are atherosclerotic
calcifications of the aorta. Pacemaker is again seen.

There is adequate opacification of the pulmonary arteries to the
segmental level. There is no evidence for pulmonary embolism.

Mediastinum/Nodes: No enlarged mediastinal, hilar, or axillary lymph
nodes. Thyroid gland, trachea, and esophagus demonstrate no
significant findings. There is a small hiatal hernia.

Lungs/Pleura: There are mild peripheral interstitial opacities
throughout both lungs in the lower lung predominance. There are
tree-in-bud and patchy ground-glass opacities in the bilateral
inferior upper lobes, right greater than left. There is no lung
consolidation, pleural effusion or pneumothorax.

Upper Abdomen: There are rounded hypodense and mildly hyperdense
areas within the kidneys. Hyperdense areas measure up to 2 cm in the
left kidney, possibly proteinaceous cysts.

Musculoskeletal: There are chronic compression deformities of C5 and
C6 which appear unchanged from prior. No acute fractures are seen.

Review of the MIP images confirms the above findings.
IMPRESSION: 1. No evidence for pulmonary embolism. Tree-in-bud and patchy
ground-glass opacities in the bilateral upper lobes compatible with
infection/inflammation.
2. Stable mild peripheral interstitial opacities in the lower lung
predominance, likely interstitial lung disease.
3. Stable cardiomegaly.
4. Mildly hyperdense areas in the kidneys may represent
proteinaceous cysts. Other etiologies can not be excluded. This can
be further evaluated with ultrasound or MRI.
5. Small hiatal hernia.
6.  Aortic Atherosclerosis ([Z2]-[Z2]).

## 2021-08-04 MED ORDER — SODIUM CHLORIDE 0.9 % IV SOLN: 1.0000 g | Freq: Once | INTRAVENOUS | Status: DC

## 2021-08-04 MED ORDER — ACETAMINOPHEN 325 MG PO TABS
650.0000 mg | ORAL_TABLET | Freq: Once | ORAL | Status: AC
  Administered 2021-08-04: 650 mg via ORAL
  Filled 2021-08-04: qty 2

## 2021-08-04 MED ORDER — LACTATED RINGERS IV BOLUS
1000.0000 mL | Freq: Once | INTRAVENOUS | Status: AC
Start: 2021-08-04 — End: 2021-08-04
  Administered 2021-08-04: 1000 mL via INTRAVENOUS

## 2021-08-04 MED ORDER — SODIUM CHLORIDE 0.9 % IV SOLN
2.0000 g | Freq: Three times a day (TID) | INTRAVENOUS | Status: DC
  Administered 2021-08-04 – 2021-08-07 (×9): 2 g via INTRAVENOUS
  Filled 2021-08-04 (×9): qty 12.5

## 2021-08-04 MED ORDER — LACTATED RINGERS IV SOLN: INTRAVENOUS | Status: AC

## 2021-08-04 MED ORDER — SODIUM CHLORIDE 0.9 % IV BOLUS
1000.0000 mL | Freq: Once | INTRAVENOUS | Status: AC
  Administered 2021-08-04: 1000 mL via INTRAVENOUS

## 2021-08-04 MED ORDER — IOHEXOL 350 MG/ML SOLN
65.0000 mL | Freq: Once | INTRAVENOUS | Status: AC | PRN
Start: 2021-08-04 — End: 2021-08-04
  Administered 2021-08-04: 65 mL via INTRAVENOUS

## 2021-08-04 MED ORDER — ENOXAPARIN SODIUM 60 MG/0.6ML IJ SOSY
0.5000 mg/kg | PREFILLED_SYRINGE | INTRAMUSCULAR | Status: DC
  Administered 2021-08-04: 57.5 mg via SUBCUTANEOUS
  Filled 2021-08-04: qty 0.57

## 2021-08-04 MED ORDER — DOXYCYCLINE HYCLATE 100 MG PO TABS
100.0000 mg | ORAL_TABLET | Freq: Once | ORAL | Status: AC
  Administered 2021-08-04: 100 mg via ORAL
  Filled 2021-08-04: qty 1

## 2021-08-04 MED ORDER — VANCOMYCIN HCL 1500 MG/300ML IV SOLN
1500.0000 mg | INTRAVENOUS | Status: DC
  Administered 2021-08-04 – 2021-08-06 (×3): 1500 mg via INTRAVENOUS
  Filled 2021-08-04 (×4): qty 300

## 2021-08-04 NOTE — ED Provider Notes (Addendum)
[facility] EMERGENCY DEPARTMENT Provider Note   CSN: [id number] Arrival date & time: 08/04/21  1443     History  Chief Complaint  Patient presents with   Mark Thomas is a 71 y.o. male.   Fall Associated symptoms include chest pain, headaches and shortness of breath. Patient presents after a fall.  He has reportedly had several falls recently.  EMS noted hypotension on scene.  His medical history includes anemia, remote alcoholism, CKD, depression, GERD, PUD, gout, bradycardia s/p pacemaker, anxiety, TIA, vascular dementia, CVA, CAD.  He states that he has had repeated episodes of pneumonia since January.  He was recently prescribed 5 days of azithromycin which she recently finished.  He has also been on prednisone and breathing treatments.  He is on aspirin and Plavix.  He is not on anticoagulation.  Currently he endorses headache pain as well as bilateral chest pain.  He reports that the chest pain has been persistent due to his pneumonia.     Home Medications Prior to Admission medications   Medication Sig Start Date End Date Taking? Authorizing Provider  acetaminophen (TYLENOL) 500 MG tablet Take 1,000 mg by mouth every 6 (six) hours as needed for mild pain.   Yes [provider]  albuterol (PROVENTIL) (2.5 MG/3ML) 0.083% nebulizer solution Take 3 mLs (2.5 mg total) by nebulization every 6 (six) hours as needed for wheezing or shortness of breath. 07/15/21  Yes Everrett Coombe, DO  albuterol (VENTOLIN HFA) 108 (90 Base) MCG/ACT inhaler Inhale 2 puffs into the lungs every 6 (six) hours as needed for wheezing or shortness of breath.   Yes [provider]  allopurinol (ZYLOPRIM) 100 MG tablet Take 100 mg by mouth daily. 09/22/20  Yes [provider]  aspirin 81 MG EC tablet Take 1 tablet by mouth daily. 05/05/14  Yes [provider]  atorvastatin (LIPITOR) 80 MG tablet Take 80 mg by mouth daily. 11/18/20  Yes [provider]  b complex vitamins capsule Take 1 capsule by mouth daily.   Yes [provider]  budesonide-formoterol (SYMBICORT) 160-4.5 MCG/ACT inhaler Inhale 2 puffs into the lungs 2 (two) times daily. Patient taking differently: Inhale 1 puff into the lungs 2 (two) times daily. 07/28/21  Yes Monica Becton, MD  chlorpheniramine-HYDROcodone 10-8 MG/5ML Take 5 mLs by mouth every 12 (twelve) hours as needed for cough (cough, will cause drowsiness.). 07/28/21  Yes Monica Becton, MD  Cholecalciferol (VITAMIN D-3) 125 MCG (5000 UT) TABS Take 5,000 Units by mouth daily.   Yes [provider]  clopidogrel (PLAVIX) 75 MG tablet Take 75 mg by mouth daily. 11/18/20  Yes [provider]  escitalopram (LEXAPRO) 20 MG tablet Take 1.5 tablets (30 mg total) by mouth daily. 05/04/21  Yes Thresa Ross, MD  ferrous sulfate 325 (65 FE) MG EC tablet Take 1 tablet (325 mg total) by mouth in the morning and at bedtime. 03/19/21 08/30/21 Yes Everrett Coombe, DO  furosemide (LASIX) 20 MG tablet Take 1 tablet (20 mg total) by mouth daily. 03/22/21 08/04/21 Yes Georgeanna Lea, MD  Hydrocod Polst-Chlorphen Polst [facility] PO) Take 5 mLs by mouth.   Yes [provider]  latanoprost (XALATAN) 0.005 % ophthalmic solution Place 1 drop into both eyes at bedtime. 12/07/20  Yes [provider]  losartan (COZAAR) 25 MG tablet Take 25 mg by mouth daily as needed (BP >160). 09/26/20  Yes [provider]  magnesium oxide (  MAG-OX) 400 MG tablet Take 400 mg by mouth daily.   Yes [provider]  memantine (NAMENDA) 10 MG tablet Take 1 tablet (10 mg total) by mouth 2 (two) times daily. 05/11/21 08/09/21 Yes Camara, Amalia Hailey, MD  metoprolol tartrate (LOPRESSOR) 25 MG tablet Take 12.5 mg by mouth 2 (two) times daily. 11/18/20  Yes [provider]  nitroGLYCERIN (NITROLINGUAL) 0.4 MG/SPRAY spray Place 1 spray under the tongue every 5 (five)  minutes x 3 doses as needed for chest pain. 05/05/14  Yes [provider]  pantoprazole (PROTONIX) 40 MG tablet Take 1 tablet (40 mg total) by mouth every evening. 07/07/21  Yes Everrett Coombe, DO  potassium citrate (UROCIT-K) 10 MEQ (1080 MG) SR tablet Take 10 mEq by mouth daily. 09/22/20  Yes [provider]  primidone (MYSOLINE) 50 MG tablet Take 2 tablets (100 mg total) by mouth 2 (two) times daily. 05/10/21 08/08/21 Yes Camara, Amalia Hailey, MD  QUEtiapine (SEROQUEL) 50 MG tablet Take 2 tablets (100 mg total) by mouth at bedtime. 07/28/21 08/27/21 Yes Thresa Ross, MD  ranolazine (RANEXA) 1000 MG SR tablet Take 1,000 mg by mouth 2 (two) times daily. 11/18/20  Yes [provider]  rOPINIRole (REQUIP XL) 4 MG 24 hr tablet Take 1 tablet (4 mg total) by mouth at bedtime. 05/27/21 08/25/21 Yes Camara, Amalia Hailey, MD  triamcinolone ointment (KENALOG) 0.1 % Apply 1 application topically 2 (two) times daily. 01/21/21  Yes [provider]  AMBULATORY NON FORMULARY MEDICATION Community acquired pneumonia of right upper lobe of lung Dx J18.9 Nebulizer machine with supplies Patient not taking: Reported on 08/04/2021 06/25/21   Agapito Games, MD  azithromycin (ZITHROMAX Z-PAK) 250 MG tablet Take 2 tablets (500 mg) on  Day 1,  followed by 1 tablet (250 mg) once daily on Days 2 through 5. Patient taking differently: Take 250 mg by mouth See admin instructions. Start date: 07/28/21 . Zpack 07/28/21   Monica Becton, MD  dexamethasone (DECADRON) 4 MG tablet Take 1 tablet (4 mg total) by mouth 3 (three) times daily. Patient not taking: Reported on 08/04/2021 07/28/21   Monica Becton, MD  fluticasone [facility]) 50 MCG/ACT nasal spray Place 2 sprays into both nostrils daily. Patient not taking: Reported on 08/04/2021 05/07/21   Everrett Coombe, DO  Oxcarbazepine (TRILEPTAL) 300 MG tablet Take 1 tablet (300 mg total) by mouth daily. Patient not taking: Reported on 08/04/2021 06/01/21 08/30/21   Windell Norfolk, MD  predniSONE (DELTASONE) 10 MG tablet Take 2 tablets (20 mg total) by mouth daily. Patient not taking: Reported on 08/04/2021 06/30/21   Margarita Grizzle, MD      Allergies    Dilaudid [hydromorphone], Bactrim [sulfamethoxazole-trimethoprim], Benadryl [diphenhydramine], and Phenobarbital    Review of Systems   Review of Systems  Respiratory:  Positive for cough and shortness of breath.   Cardiovascular:  Positive for chest pain.  Neurological:  Positive for dizziness, weakness (Generalized), light-headedness and headaches.  All other systems reviewed and are negative.  Physical Exam Updated Vital Signs BP 116/75   Pulse 75   Temp 98.1 F (36.7 C) (Oral)   Resp 13   Wt 113.4 kg   SpO2 99%   BMI 32.98 kg/m  Physical Exam Vitals and nursing note reviewed.  Constitutional:      General: He is not in acute distress.    Appearance: He is well-developed. He is not toxic-appearing or diaphoretic.  HENT:     Head: Normocephalic.     Comments: Abrasion  to forehead    Right Ear: External ear normal.     Left Ear: External ear normal.     Nose: No congestion.     Comments: Abrasion to bridge of nose    Mouth/Throat:     Mouth: Mucous membranes are moist.  Eyes:     General: No scleral icterus.    Extraocular Movements: Extraocular movements intact.     Conjunctiva/sclera: Conjunctivae normal.  Cardiovascular:     Rate and Rhythm: Normal rate and regular rhythm.     Heart sounds: No murmur heard. Pulmonary:     Effort: Pulmonary effort is normal. No respiratory distress.     Breath sounds: Normal breath sounds.  Abdominal:     Palpations: Abdomen is soft.     Tenderness: There is no abdominal tenderness.  Musculoskeletal:        General: No swelling. Normal range of motion.     Cervical back: Normal range of motion and neck supple.     Right lower leg: No edema.     Left lower leg: No edema.  Skin:    General: Skin is dry.     Capillary Refill: Capillary  refill takes less than 2 seconds.     Coloration: Skin is not jaundiced or pale.  Neurological:     General: No focal deficit present.     Mental Status: He is alert and oriented to person, place, and time.     Cranial Nerves: No cranial nerve deficit.     Sensory: No sensory deficit.     Motor: No weakness.     Coordination: Coordination normal.  Psychiatric:        Mood and Affect: Mood normal.        Behavior: Behavior normal.        Thought Content: Thought content normal.        Judgment: Judgment normal.    ED Results / Procedures / Treatments   Labs (all labs ordered are listed, but only abnormal results are displayed) Labs Reviewed  COMPREHENSIVE METABOLIC PANEL - Abnormal; Notable for the following components:      Result Value   BUN 28 (*)    Creatinine, Ser 1.51 (*)    Total Protein 6.3 (*)    Albumin 3.3 (*)    AST 54 (*)    ALT 64 (*)    GFR, Estimated 50 (*)    All other components within normal limits  CBC WITH DIFFERENTIAL/PLATELET - Abnormal; Notable for the following components:   WBC 13.1 (*)    MCV 103.7 (*)    Neutro Abs 7.9 (*)    Monocytes Absolute 1.5 (*)    Abs Immature Granulocytes 0.32 (*)    All other components within normal limits  URINALYSIS, ROUTINE W REFLEX MICROSCOPIC - Abnormal; Notable for the following components:   Color, Urine AMBER (*)    APPearance HAZY (*)    All other components within normal limits  LACTIC ACID, PLASMA - Abnormal; Notable for the following components:   Lactic Acid, Venous 2.2 (*)    All other components within normal limits  LACTIC ACID, PLASMA - Abnormal; Notable for the following components:   Lactic Acid, Venous 2.2 (*)    All other components within normal limits  BRAIN NATRIURETIC PEPTIDE - Abnormal; Notable for the following components:   B Natriuretic Peptide 210.0 (*)    All other components within normal limits  I-STAT CHEM 8, ED - Abnormal; Notable for the following  components:   Sodium 133 (*)     BUN 32 (*)    Creatinine, Ser 1.40 (*)    Calcium, Ion 1.05 (*)    All other components within normal limits  TROPONIN I (HIGH SENSITIVITY) - Abnormal; Notable for the following components:   Troponin I (High Sensitivity) 24 (*)    All other components within normal limits  TROPONIN I (HIGH SENSITIVITY) - Abnormal; Notable for the following components:   Troponin I (High Sensitivity) 20 (*)    All other components within normal limits  RESP PANEL BY RT-PCR (FLU A&B, COVID) [id number]  CULTURE, BLOOD (ROUTINE X 2)  CULTURE, BLOOD (ROUTINE X 2)  CULTURE, BLOOD (SINGLE)  EXPECTORATED SPUTUM ASSESSMENT W GRAM STAIN, RFLX TO RESP C  MRSA NEXT GEN BY PCR, NASAL  MAGNESIUM  LACTIC ACID, PLASMA  BASIC METABOLIC PANEL  CBC  LEGIONELLA PNEUMOPHILA SEROGP 1 UR AG  STREP PNEUMONIAE URINARY ANTIGEN  LACTIC ACID, PLASMA    EKG EKG Interpretation  Date/Time:  Wednesday August 04 2021 14:54:39 EDT Ventricular Rate:  77 PR Interval:  190 QRS Duration: 90 QT Interval:  424 QTC Calculation: 480 R Axis:   18 Text Interpretation: Sinus rhythm Borderline prolonged QT interval Confirmed by Gloris Manchester ([id number]) on 08/04/2021 3:00:01 PM  Radiology CT Head Wo Contrast  Result Date: 08/04/2021 CLINICAL DATA:  Fall EXAM: CT HEAD WITHOUT CONTRAST CT MAXILLOFACIAL WITHOUT CONTRAST CT CERVICAL SPINE WITHOUT CONTRAST TECHNIQUE: Multidetector CT imaging of the head, cervical spine, and maxillofacial structures were performed using the standard protocol without intravenous contrast. Multiplanar CT image reconstructions of the cervical spine and maxillofacial structures were also generated. RADIATION DOSE REDUCTION: This exam was performed according to the departmental dose-optimization program which includes automated exposure control, adjustment of the mA and/or kV according to patient size and/or use of iterative reconstruction technique. COMPARISON:  01/07/2021 CT head and CTA head and neck, 12/27/2020 CT cervical  spine FINDINGS: CT HEAD FINDINGS Brain: No evidence of acute infarction, hemorrhage, cerebral edema, mass, mass effect, or midline shift. No hydrocephalus or extra-axial fluid collection. Redemonstrated encephalomalacia in the anterior frontal lobes. Periventricular white matter changes, likely the sequela of chronic small vessel ischemic disease. Left caudate and right corona radiata lacunar infarcts. Vascular: No hyperdense vessel. Atherosclerotic calcifications in the intracranial carotid and vertebral arteries. Skull: Prior frontal craniotomy. Negative for fracture or focal lesion. Other: None. CT MAXILLOFACIAL FINDINGS Osseous: No fracture or mandibular dislocation. No destructive process. Orbits: No acute finding. Unchanged irregularity of the medial orbital walls. Sinuses: Unchanged apparent osseous defect in the anterior wall of the right maxillary sinus. Irregularity of the bilateral lamina papyracea. Redemonstrated diffuse osseous thickening, most prominently in the maxillary sinuses, with postsurgical changes including left maxillary antrostomy, partial ethmoidectomies, and bilateral sphenoidotomies. Mild mucosal thickening throughout the paranasal sinuses. Soft tissues: Negative. CT CERVICAL SPINE FINDINGS Alignment: No listhesis. Skull base and vertebrae: No acute fracture. No primary bone lesion or focal pathologic process. Redemonstrated chronic vertebral body height loss in C5 and C6. Soft tissues and spinal canal: No prevertebral fluid or swelling. No visible canal hematoma. Disc levels: Multilevel degenerative changes, with mild spinal canal stenosis at C6-C7. Multilevel uncovertebral and facet arthropathy. Upper chest: No focal pulmonary opacity or pleural effusion. Other: None. IMPRESSION: 1.  No acute intracranial process. 2.  No acute fracture or traumatic listhesis in the cervical spine. 3. No acute facial bone fracture. Electronically Signed   By: Wiliam Ke M.D.   On: 08/04/2021 18:23  CT Angio Chest PE W and/or Wo Contrast  Result Date: 08/04/2021 CLINICAL DATA:  Fall.  PE suspected. EXAM: CT ANGIOGRAPHY CHEST WITH CONTRAST TECHNIQUE: Multidetector CT imaging of the chest was performed using the standard protocol during bolus administration of intravenous contrast. Multiplanar CT image reconstructions and MIPs were obtained to evaluate the vascular anatomy. RADIATION DOSE REDUCTION: This exam was performed according to the departmental dose-optimization program which includes automated exposure control, adjustment of the mA and/or kV according to patient size and/or use of iterative reconstruction technique. CONTRAST:  65mL OMNIPAQUE IOHEXOL 350 MG/ML SOLN COMPARISON:  Chest CT 06/22/2021. CT angiogram head and neck 01/07/2021. FINDINGS: Cardiovascular: Heart is mildly enlarged. There is no pericardial effusion. Aorta is normal in size. There are atherosclerotic calcifications of the aorta. Pacemaker is again seen. There is adequate opacification of the pulmonary arteries to the segmental level. There is no evidence for pulmonary embolism. Mediastinum/Nodes: No enlarged mediastinal, hilar, or axillary lymph nodes. Thyroid gland, trachea, and esophagus demonstrate no significant findings. There is a small hiatal hernia. Lungs/Pleura: There are mild peripheral interstitial opacities throughout both lungs in the lower lung predominance. There are tree-in-bud and patchy ground-glass opacities in the bilateral inferior upper lobes, right greater than left. There is no lung consolidation, pleural effusion or pneumothorax. Upper Abdomen: There are rounded hypodense and mildly hyperdense areas within the kidneys. Hyperdense areas measure up to 2 cm in the left kidney, possibly proteinaceous cysts. Musculoskeletal: There are chronic compression deformities of C5 and C6 which appear unchanged from prior. No acute fractures are seen. Review of the MIP images confirms the above findings. IMPRESSION: 1.  No evidence for pulmonary embolism. Tree-in-bud and patchy ground-glass opacities in the bilateral upper lobes compatible with infection/inflammation. 2. Stable mild peripheral interstitial opacities in the lower lung predominance, likely interstitial lung disease. 3. Stable cardiomegaly. 4. Mildly hyperdense areas in the kidneys may represent proteinaceous cysts. Other etiologies can not be excluded. This can be further evaluated with ultrasound or MRI. 5. Small hiatal hernia. 6.  Aortic Atherosclerosis (ICD10-I70.0). Electronically Signed   By: Darliss Cheney M.D.   On: 08/04/2021 18:20   CT Cervical Spine Wo Contrast  Result Date: 08/04/2021 CLINICAL DATA:  Fall EXAM: CT HEAD WITHOUT CONTRAST CT MAXILLOFACIAL WITHOUT CONTRAST CT CERVICAL SPINE WITHOUT CONTRAST TECHNIQUE: Multidetector CT imaging of the head, cervical spine, and maxillofacial structures were performed using the standard protocol without intravenous contrast. Multiplanar CT image reconstructions of the cervical spine and maxillofacial structures were also generated. RADIATION DOSE REDUCTION: This exam was performed according to the departmental dose-optimization program which includes automated exposure control, adjustment of the mA and/or kV according to patient size and/or use of iterative reconstruction technique. COMPARISON:  01/07/2021 CT head and CTA head and neck, 12/27/2020 CT cervical spine FINDINGS: CT HEAD FINDINGS Brain: No evidence of acute infarction, hemorrhage, cerebral edema, mass, mass effect, or midline shift. No hydrocephalus or extra-axial fluid collection. Redemonstrated encephalomalacia in the anterior frontal lobes. Periventricular white matter changes, likely the sequela of chronic small vessel ischemic disease. Left caudate and right corona radiata lacunar infarcts. Vascular: No hyperdense vessel. Atherosclerotic calcifications in the intracranial carotid and vertebral arteries. Skull: Prior frontal craniotomy. Negative  for fracture or focal lesion. Other: None. CT MAXILLOFACIAL FINDINGS Osseous: No fracture or mandibular dislocation. No destructive process. Orbits: No acute finding. Unchanged irregularity of the medial orbital walls. Sinuses: Unchanged apparent osseous defect in the anterior wall of the right maxillary sinus. Irregularity of the bilateral lamina papyracea. Redemonstrated  diffuse osseous thickening, most prominently in the maxillary sinuses, with postsurgical changes including left maxillary antrostomy, partial ethmoidectomies, and bilateral sphenoidotomies. Mild mucosal thickening throughout the paranasal sinuses. Soft tissues: Negative. CT CERVICAL SPINE FINDINGS Alignment: No listhesis. Skull base and vertebrae: No acute fracture. No primary bone lesion or focal pathologic process. Redemonstrated chronic vertebral body height loss in C5 and C6. Soft tissues and spinal canal: No prevertebral fluid or swelling. No visible canal hematoma. Disc levels: Multilevel degenerative changes, with mild spinal canal stenosis at C6-C7. Multilevel uncovertebral and facet arthropathy. Upper chest: No focal pulmonary opacity or pleural effusion. Other: None. IMPRESSION: 1.  No acute intracranial process. 2.  No acute fracture or traumatic listhesis in the cervical spine. 3. No acute facial bone fracture. Electronically Signed   By: Wiliam Ke M.D.   On: 08/04/2021 18:23   DG Chest Portable 1 View  Result Date: 08/04/2021 CLINICAL DATA:  Trauma, fall EXAM: PORTABLE CHEST 1 VIEW COMPARISON:  07/28/2021 FINDINGS: Transverse diameter of heart is increased. Pacemaker battery is seen in the left infraclavicular region. There are no signs of alveolar pulmonary edema or focal pulmonary consolidation. There is poor inspiration. Lateral CP angles are indistinct. There is no pneumothorax. IMPRESSION: Cardiomegaly. There are no signs of alveolar pulmonary edema or focal pulmonary consolidation. Small bilateral pleural effusions.  Electronically Signed   By: Ernie Avena M.D.   On: 08/04/2021 15:31   CT Maxillofacial Wo Contrast  Result Date: 08/04/2021 CLINICAL DATA:  Fall EXAM: CT HEAD WITHOUT CONTRAST CT MAXILLOFACIAL WITHOUT CONTRAST CT CERVICAL SPINE WITHOUT CONTRAST TECHNIQUE: Multidetector CT imaging of the head, cervical spine, and maxillofacial structures were performed using the standard protocol without intravenous contrast. Multiplanar CT image reconstructions of the cervical spine and maxillofacial structures were also generated. RADIATION DOSE REDUCTION: This exam was performed according to the departmental dose-optimization program which includes automated exposure control, adjustment of the mA and/or kV according to patient size and/or use of iterative reconstruction technique. COMPARISON:  01/07/2021 CT head and CTA head and neck, 12/27/2020 CT cervical spine FINDINGS: CT HEAD FINDINGS Brain: No evidence of acute infarction, hemorrhage, cerebral edema, mass, mass effect, or midline shift. No hydrocephalus or extra-axial fluid collection. Redemonstrated encephalomalacia in the anterior frontal lobes. Periventricular white matter changes, likely the sequela of chronic small vessel ischemic disease. Left caudate and right corona radiata lacunar infarcts. Vascular: No hyperdense vessel. Atherosclerotic calcifications in the intracranial carotid and vertebral arteries. Skull: Prior frontal craniotomy. Negative for fracture or focal lesion. Other: None. CT MAXILLOFACIAL FINDINGS Osseous: No fracture or mandibular dislocation. No destructive process. Orbits: No acute finding. Unchanged irregularity of the medial orbital walls. Sinuses: Unchanged apparent osseous defect in the anterior wall of the right maxillary sinus. Irregularity of the bilateral lamina papyracea. Redemonstrated diffuse osseous thickening, most prominently in the maxillary sinuses, with postsurgical changes including left maxillary antrostomy, partial  ethmoidectomies, and bilateral sphenoidotomies. Mild mucosal thickening throughout the paranasal sinuses. Soft tissues: Negative. CT CERVICAL SPINE FINDINGS Alignment: No listhesis. Skull base and vertebrae: No acute fracture. No primary bone lesion or focal pathologic process. Redemonstrated chronic vertebral body height loss in C5 and C6. Soft tissues and spinal canal: No prevertebral fluid or swelling. No visible canal hematoma. Disc levels: Multilevel degenerative changes, with mild spinal canal stenosis at C6-C7. Multilevel uncovertebral and facet arthropathy. Upper chest: No focal pulmonary opacity or pleural effusion. Other: None. IMPRESSION: 1.  No acute intracranial process. 2.  No acute fracture or traumatic listhesis in the cervical spine.  3. No acute facial bone fracture. Electronically Signed   By: Wiliam Ke M.D.   On: 08/04/2021 18:23    Procedures Procedures    Medications Ordered in ED Medications  enoxaparin (LOVENOX) injection 57.5 mg (57.5 mg Subcutaneous Given 08/04/21 2212)  lactated ringers infusion ( Intravenous New Bag/Given 08/04/21 2153)  vancomycin (VANCOREADY) IVPB 1500 mg/300 mL (0 mg Intravenous Stopped 08/05/21 0006)  ceFEPIme (MAXIPIME) 2 g in sodium chloride 0.9 % 100 mL IVPB (0 g Intravenous Stopped 08/04/21 2148)  aspirin EC tablet 81 mg (has no administration in time range)  allopurinol (ZYLOPRIM) tablet 100 mg (has no administration in time range)  acetaminophen (TYLENOL) tablet 1,000 mg (has no administration in time range)  atorvastatin (LIPITOR) tablet 80 mg (has no administration in time range)  metoprolol tartrate (LOPRESSOR) tablet 12.5 mg (has no administration in time range)  ranolazine (RANEXA) 12 hr tablet 1,000 mg (has no administration in time range)  escitalopram (LEXAPRO) tablet 30 mg (has no administration in time range)  memantine (NAMENDA) tablet 10 mg (has no administration in time range)  QUEtiapine (SEROQUEL) tablet 100 mg (has no  administration in time range)  magnesium oxide (MAG-OX) tablet 400 mg (has no administration in time range)  pantoprazole (PROTONIX) EC tablet 40 mg (has no administration in time range)  clopidogrel (PLAVIX) tablet 75 mg (has no administration in time range)  ferrous sulfate tablet 325 mg (has no administration in time range)  primidone (MYSOLINE) tablet 100 mg (has no administration in time range)  rOPINIRole (REQUIP XL) 24 hr tablet 4 mg (has no administration in time range)  cholecalciferol (VITAMIN D3) tablet 5,000 Units (has no administration in time range)  mometasone-formoterol (DULERA) 200-5 MCG/ACT inhaler 2 puff (has no administration in time range)  chlorpheniramine-HYDROcodone 10-8 MG/5ML suspension 5 mL (has no administration in time range)  latanoprost (XALATAN) 0.005 % ophthalmic solution 1 drop (has no administration in time range)  sodium chloride 0.9 % bolus 1,000 mL (0 mLs Intravenous Stopped 08/04/21 1552)  acetaminophen (TYLENOL) tablet 650 mg (650 mg Oral Given 08/04/21 1704)  iohexol (OMNIPAQUE) 350 MG/ML injection 65 mL (65 mLs Intravenous Contrast Given 08/04/21 1804)  doxycycline (VIBRA-TABS) tablet 100 mg (100 mg Oral Given 08/04/21 1917)  lactated ringers bolus 1,000 mL (0 mLs Intravenous Stopped 08/04/21 2036)    ED Course/ Medical Decision Making/ A&P                           Medical Decision Making Amount and/or Complexity of Data Reviewed Labs: ordered. Radiology: ordered.  Risk OTC drugs. Prescription drug management. Decision regarding hospitalization.   This patient presents to the ED for concern of generalized weakness and fall, this involves an extensive number of treatment options, and is a complaint that carries with it a high risk of complications and morbidity.  The differential diagnosis includes acute injuries, infection, sepsis, dehydration, CHF, PE, anemia   Co morbidities that complicate the patient evaluation  anemia, remote alcoholism, CKD,  depression, GERD, PUD, gout, bradycardia s/p pacemaker, anxiety, TIA, vascular dementia, CVA, CAD   Additional history obtained:  Additional history obtained from patient's wife and son External records from outside source obtained and reviewed including EMR   Lab Tests:  I Ordered, and personally interpreted labs.  The pertinent results include: Leukocytosis, increase in creatinine from baseline, mildly elevated lactate, normal electrolytes, normal hemoglobin, mild elevation in transaminases and troponin, mildly elevated BNP   Imaging Studies ordered:  I ordered imaging studies including chest x-ray, CTA chest, CT of head, face, and cervical spine I independently visualized and interpreted imaging which showed no acute injuries from his fall.  CTA of chest showed evidence of bilateral upper lobe pneumonia I agree with the radiologist interpretation   Cardiac Monitoring: / EKG:  The patient was maintained on a cardiac monitor.  I personally viewed and interpreted the cardiac monitored which showed an underlying rhythm of: Sinus rhythm  Problem List / ED Course / Critical interventions / Medication management  71 year old male who presents from home due to generalized weakness, dizziness and lightheadedness with standing, ongoing cough, and recent falls.  EMS noted hypotension on scene.  Blood pressures mildly improved on arrival.  Patient is alert and oriented.  He does have abrasions to area of face and forehead.  Breathing is unlabored while at rest.  Lungs are clear to auscultation.  His abdomen is soft and nontender.  I have low suspicion of his hypotension being a result of traumatic injuries from his fall.  I suspect his generalized weakness and falls have been from hypotension.  IV fluids were given.  Patient had improvement in blood pressure following IV fluids.  Diagnostic work-up was initiated and was notable for leukocytosis, lactic acidosis, and evidence of multifocal pneumonia  on CTA chest.  Per chart review, patient has undergone recent courses of antibiotics for pneumonia.  He recently finished a course of azithromycin.  Ceftriaxone and doxycycline were ordered.  Patient made hemodynamically stable and lactic acid improved following IV fluids.  Etiology of his recent symptoms appears to be infectious.  In terms of cardiology work-up, patient had a mildly elevated troponin which down trended on repeat.  BNP was mildly elevated.  Pacemaker was interrogated and did not show any malfunction or recent events or arrhythmias.  Given his failure of outpatient management of his pneumonia, which does appear to be worse on today's CT scan, he was admitted to medicine for further management. I ordered medication including IV fluids for hypotension; antibiotics for pneumonia; Tylenol for analgesia Reevaluation of the patient after these medicines showed that the patient improved I have reviewed the patients home medicines and have made adjustments as needed   Social Determinants of Health:  Has access to outpatient care         Final Clinical Impression(s) / ED Diagnoses Final diagnoses:  Multifocal pneumonia  Fall, initial encounter  Generalized weakness    Rx / DC Orders ED Discharge Orders     None            Gloris Manchester, MD 08/05/21 0040

## 2021-08-04 NOTE — ED Triage Notes (Addendum)
BIB [facility] s/p fall from home, hit head and face, abrasion present, no LOC, hypotensive for EMS sBP 70, repeat 80/60, stroke screen negative, c/o HA, and some sob, nausea and dizziness, declined IV, recent PNA, pt of [facility], takes plavix, alert, NAD, calm, interactive, no dyspnea. Family arrives upon arrival. EDP present on arrival. sBP on arrival 105. Repeat 78/60. EDP aware. ?

## 2021-08-04 NOTE — ED Notes (Signed)
[employer] reports no arrythmias, no events, functioning well, report to follow.  ?

## 2021-08-04 NOTE — Assessment & Plan Note (Addendum)
CTA chest showed no pulmonary embolism but had tree-in-bud and patchy groundglass opacity in bilateral upper lobe compatible with infection/inflammation. Possible post-covid complications. Stable mild peripheral interstitial opacities in the lower lung likely interstitial lung disease. ?-This has been recurrent and persistent since post-COVID in December. He has underwent multiple rounds of antibiotics and steroids with minimal improvement.  Suggest pulmonary consult in the morning. ?-Switch IV antibiotics to IV vancomycin and cefepime.  Obtain sputum culture, Legionella and strep pneumo urine antigen. ?

## 2021-08-04 NOTE — Progress Notes (Signed)
Pharmacy Antibiotic Note ? ?Mark Thomas is a 71 y.o. male admitted on 08/04/2021 with pneumonia.  Pharmacy has been consulted for vanc/cefepime dosing. ? ?Pt presented after a fall. He has elevated lactate. CT showed likely infection. Empiric vanc and cefepime. ? ?Scr 1.4>>Crcl 81 ml/min ?Wbc 13.1 ? ?Plan: ?Vanc 1.5g IV q24>>AUC 512, scr 1.4 ?Cefepime 2g IV q8 ? ?Weight: 113.4 kg (250 lb) ? ?Temp (24hrs), Avg:98.1 ?F (36.7 ?C), Min:98.1 ?F (36.7 ?C), Max:98.1 ?F (36.7 ?C) ? ?Recent Labs  ?Lab 08/04/21 ?1452 08/04/21 ?1453 08/04/21 ?1535 08/04/21 ?1635  ?WBC 13.1*  --   --   --   ?CREATININE 1.51*  --  1.40*  --   ?LATICACIDVEN  --  2.2*  --  2.2*  ?  ?Estimated Creatinine Clearance: 66.6 mL/min (A) (by C-G formula based on SCr of 1.4 mg/dL (H)).   ? ?Allergies  ?Allergen Reactions  ? Dilaudid [Hydromorphone] Other (See Comments)  ?  Respiratory Arrest  ? Bactrim [Sulfamethoxazole-Trimethoprim] Rash  ? Benadryl [Diphenhydramine] Other (See Comments)  ?  Muscle spasm   ? Phenobarbital   ? ? ?Antimicrobials this admission: ?4/5 vanc>> ?4/5 cefepime>> ? ?Dose adjustments this admission: ? ? ?Microbiology results: ?Resp panel neg ?4/5 blood>> ? ?Onnie Boer, PharmD, BCIDP, AAHIVP, CPP ?Infectious Disease Pharmacist ?08/04/2021 8:33 PM ? ? ? ?

## 2021-08-04 NOTE — ED Notes (Signed)
CT notified pt ready ?

## 2021-08-04 NOTE — ED Notes (Signed)
Lab notified of need for BCx. EDP notified of elevated lactic. Pt in imaging.  ?

## 2021-08-04 NOTE — Assessment & Plan Note (Signed)
Likely secondary to hypotension.  However wife mention brief episode of possible facial droop and dysarthria.  Patient does have history of CVA, will obtain MRI brain to further rule out stroke. ?

## 2021-08-04 NOTE — ED Notes (Signed)
Interrogation sent  ?

## 2021-08-04 NOTE — Assessment & Plan Note (Signed)
Mild hyperdense area in the kidneys may was present proteinaceous cysts and recommends further outpatient eval with ultrasound or MRI. ? ?

## 2021-08-04 NOTE — H&P (Addendum)
?History and Physical  ? ? ?Patient: Mark Thomas DOB: 02-17-1951 ?DOA: 08/04/2021 ?DOS: the patient was seen and examined on 08/05/2021 ?PCP: Luetta Nutting, DO  ?Patient coming from: Home ? ?Chief Complaint:  ?Chief Complaint  ?Patient presents with  ? Fall  ? ?HPI: Mark Thomas is a 71 y.o. male with medical history significant of hypertension, CAD s/p stent, CVA, mitral valve prolapse, paroxysmal atrial fibrillation s/p Watchman device, vascular dementia, CKD stage III who presents with concerns of fall and hypotension. ? ?Patient reports a fall yesterday and today.  Both times he felt dizzy and was walking on his walker and fell face first.  Wife was concerned today and called EMS since after she helped him up he almost had another fall and appeared to have slurred speech and possible facial droop. ? ?Reportedly he was hypotensive on scene per EMS.  He recently had finished a course of azithromycin and Decadron for recurrent pneumonia. ? ?Patient had COVID in December and since then has had continual cough and fatigue.  He is never quite returned back to baseline and has had continual recurrent pneumonia.  Has been treated for pneumonia in January with Augmentin and azithromycin.  He was then evaluated at urgent care earlier in February and was given Levaquin and prednisone.  Did not take prednisone due to him reporting that it worsens his depression.  Had CT chest without contrast on 06/22/2021 with patchy groundglass nodules in the right upper lobe favoring pulmonary infection.  Irregular right peritracheal lymph nodes favor reactive.  Mild interstitial lung disease at lung base. ?He then presented to ED on 2/24 and was given Rocephin and Zithromax, steroid but did not want to be admitted. ?Then represented to ED on 3/1 with expiratory wheezes and sent home with a 5-day course of prednisone.  Then saw primary care on 3/29 with persistent symptoms and was given Decadron, azithromycin and  referral was later made to pulmonology. ? ?In the ED, he was afebrile and initially had BP of 70/60 on room air.  Had leukocytosis of 13.1 and no anemia.  Lactate of 2.2.  Creatinine elevated 1.51 from a prior of 1.19.  AST of 54, ALT of 64. ? ?Chest x-ray shows cardiomegaly with bilateral pleural effusion. ? ?CTA chest showed no pulmonary embolism but had tree-in-bud and patchy groundglass opacity in bilateral upper lobe compatible with infection/inflammation.  Stable mild peripheral interstitial opacities in the lower lung likely interstitial lung disease.  Mild hyperdense area in the kidneys may was present proteinaceous cysts and recommends further eval with ultrasound or MRI. ? ?CT head, cervical spine and maxillofacial imaging was negative for any acute fractures. ? ?EKG shows sinus rhythm with borderline QT. ? ?He was started on IV Rocephin and doxycycline in the ED. ? ?Review of Systems: As mentioned in the history of present illness. All other systems reviewed and are negative. ?Past Medical History:  ?Diagnosis Date  ? Anxiety   ? Coronary artery disease   ? Depression   ? Essential tremor   ? GERD (gastroesophageal reflux disease)   ? High cholesterol   ? Hypertension   ? Myocardial infarct [facility])   ? Orthostatic hypotension   ? Stroke [facility])   ? Vascular dementia ([place])   ? ?Past Surgical History:  ?Procedure Laterality Date  ? APPENDECTOMY    ? CARDIAC SURGERY    ? CHOLECYSTECTOMY    ? HERNIA REPAIR    ? NASAL SINUS SURGERY    ?  SHOULDER SURGERY    ? ?Social History:  reports that he has never smoked. He has never been exposed to tobacco smoke. He has never used smokeless tobacco. He reports that he does not currently use alcohol. He reports that he does not use drugs. ? ?Allergies  ?Allergen Reactions  ? Dilaudid [Hydromorphone] Other (See Comments)  ?  Respiratory Arrest  ? Bactrim [Sulfamethoxazole-Trimethoprim] Rash  ? Benadryl [Diphenhydramine] Other (See Comments)  ?  Muscle spasm   ? Phenobarbital    ? ? ?Family History  ?Problem Relation Age of Onset  ? Hypertension Mother   ? Stroke Mother   ? Stroke Father   ? Hypertension Father   ? ? ?Prior to Admission medications   ?Medication Sig Start Date End Date Taking? Authorizing Provider  ?acetaminophen (TYLENOL) 500 MG tablet Take 1,000 mg by mouth every 6 (six) hours as needed for mild pain.    [provider]  ?albuterol (PROVENTIL) (2.5 MG/3ML) 0.083% nebulizer solution Take 3 mLs (2.5 mg total) by nebulization every 6 (six) hours as needed for wheezing or shortness of breath. 07/15/21   Luetta Nutting, DO  ?allopurinol (ZYLOPRIM) 100 MG tablet Take 100 mg by mouth daily. 09/22/20   [provider]  ?[place] acquired pneumonia of right upper lobe of lung Dx J18.9 Nebulizer machine with supplies 06/25/21   Hali Marry, MD  ?aspirin 81 MG EC tablet Take 1 tablet by mouth daily. 05/05/14   [provider]  ?atorvastatin (LIPITOR) 80 MG tablet Take 80 mg by mouth daily. 11/18/20   [provider]  ?azithromycin (ZITHROMAX Z-PAK) 250 MG tablet Take 2 tablets (500 mg) on  Day 1,  followed by 1 tablet (250 mg) once daily on Days 2 through 5. 07/28/21   Silverio Decamp, MD  ?budesonide-formoterol (SYMBICORT) 160-4.5 MCG/ACT inhaler Inhale 2 puffs into the lungs 2 (two) times daily. 07/28/21   Silverio Decamp, MD  ?chlorpheniramine-HYDROcodone 10-8 MG/5ML Take 5 mLs by mouth every 12 (twelve) hours as needed for cough (cough, will cause drowsiness.). 07/28/21   Silverio Decamp, MD  ?Cholecalciferol (VITAMIN D-3) 125 MCG (5000 UT) TABS Take 1 tablet by mouth daily.    [provider]  ?clopidogrel (PLAVIX) 75 MG tablet Take 75 mg by mouth daily. 11/18/20   [provider]  ?dexamethasone (DECADRON) 4 MG tablet Take 1 tablet (4 mg total) by mouth 3 (three) times daily. 07/28/21   Silverio Decamp, MD  ?escitalopram (LEXAPRO) 20 MG tablet Take 1.5 tablets  (30 mg total) by mouth daily. 05/04/21   Merian Capron, MD  ?ferrous sulfate 325 (65 FE) MG EC tablet Take 1 tablet (325 mg total) by mouth in the morning and at bedtime. 03/19/21 08/30/21  Luetta Nutting, DO  ?fluticasone (FLONASE) 50 MCG/ACT nasal spray Place 2 sprays into both nostrils daily. 05/07/21   Luetta Nutting, DO  ?furosemide (LASIX) 20 MG tablet Take 1 tablet (20 mg total) by mouth daily. 03/22/21 06/20/21  Park Liter, MD  ?latanoprost (XALATAN) 0.005 % ophthalmic solution Place 1 drop into both eyes at bedtime. 12/07/20   [provider]  ?losartan (COZAAR) 25 MG tablet Take 25 mg by mouth daily as needed (BP >160). 09/26/20   [provider]  ?magnesium oxide (MAG-OX) 400 MG tablet Take 400 mg by mouth daily.    [provider]  ?memantine (NAMENDA) 10 MG tablet Take 1 tablet (10 mg total) by mouth 2 (two) times  daily. 05/11/21 08/09/21  Alric Ran, MD  ?metoprolol tartrate (LOPRESSOR) 25 MG tablet Take 12.5 mg by mouth 2 (two) times daily. 11/18/20   [provider]  ?nitroGLYCERIN (NITROLINGUAL) 0.4 MG/SPRAY spray Place 1 spray under the tongue every 5 (five) minutes x 3 doses as needed for chest pain. 05/05/14   [provider]  ?Oxcarbazepine (TRILEPTAL) 300 MG tablet Take 1 tablet (300 mg total) by mouth daily. 06/01/21 08/30/21  Alric Ran, MD  ?pantoprazole (PROTONIX) 40 MG tablet Take 1 tablet (40 mg total) by mouth every evening. 07/07/21   Luetta Nutting, DO  ?potassium citrate (UROCIT-K) 10 MEQ (1080 MG) SR tablet Take 10 mEq by mouth daily. 09/22/20   [provider]  ?predniSONE (DELTASONE) 10 MG tablet Take 2 tablets (20 mg total) by mouth daily. 06/30/21   Pattricia Boss, MD  ?primidone (MYSOLINE) 50 MG tablet Take 2 tablets (100 mg total) by mouth 2 (two) times daily. 05/10/21 08/08/21  Alric Ran, MD  ?QUEtiapine (SEROQUEL) 50 MG tablet Take 2 tablets (100 mg total) by mouth at bedtime. 07/28/21 08/27/21  Merian Capron, MD  ?ranolazine  (RANEXA) 1000 MG SR tablet Take 1,000 mg by mouth 2 (two) times daily. 11/18/20   [provider]  ?rOPINIRole (REQUIP XL) 4 MG 24 hr tablet Take 1 tablet (4 mg total) by mouth at bedtime. 05/27/21 4/2

## 2021-08-04 NOTE — ED Notes (Signed)
Pt labels forgotten on first BC. First BC discarded. Reordered.  ?

## 2021-08-05 ENCOUNTER — Ambulatory Visit: Payer: Medicare Other | Admitting: Cardiology

## 2021-08-05 ENCOUNTER — Other Ambulatory Visit: Payer: Self-pay

## 2021-08-05 ENCOUNTER — Telehealth: Payer: Self-pay | Admitting: Pulmonary Disease

## 2021-08-05 ENCOUNTER — Inpatient Hospital Stay (HOSPITAL_COMMUNITY): Payer: Medicare Other

## 2021-08-05 DIAGNOSIS — E7800 Pure hypercholesterolemia, unspecified: Secondary | ICD-10-CM | POA: Diagnosis present

## 2021-08-05 DIAGNOSIS — Z20822 Contact with and (suspected) exposure to covid-19: Secondary | ICD-10-CM | POA: Diagnosis present

## 2021-08-05 DIAGNOSIS — R7989 Other specified abnormal findings of blood chemistry: Secondary | ICD-10-CM | POA: Diagnosis present

## 2021-08-05 DIAGNOSIS — Y92009 Unspecified place in unspecified non-institutional (private) residence as the place of occurrence of the external cause: Secondary | ICD-10-CM | POA: Diagnosis not present

## 2021-08-05 DIAGNOSIS — Z8673 Personal history of transient ischemic attack (TIA), and cerebral infarction without residual deficits: Secondary | ICD-10-CM | POA: Diagnosis not present

## 2021-08-05 DIAGNOSIS — J90 Pleural effusion, not elsewhere classified: Secondary | ICD-10-CM | POA: Diagnosis present

## 2021-08-05 DIAGNOSIS — J181 Lobar pneumonia, unspecified organism: Secondary | ICD-10-CM | POA: Diagnosis present

## 2021-08-05 DIAGNOSIS — N1831 Chronic kidney disease, stage 3a: Secondary | ICD-10-CM | POA: Diagnosis present

## 2021-08-05 DIAGNOSIS — I480 Paroxysmal atrial fibrillation: Secondary | ICD-10-CM | POA: Diagnosis present

## 2021-08-05 DIAGNOSIS — J189 Pneumonia, unspecified organism: Secondary | ICD-10-CM | POA: Diagnosis present

## 2021-08-05 DIAGNOSIS — W19XXXA Unspecified fall, initial encounter: Secondary | ICD-10-CM | POA: Diagnosis present

## 2021-08-05 DIAGNOSIS — Z6833 Body mass index (BMI) 33.0-33.9, adult: Secondary | ICD-10-CM | POA: Diagnosis not present

## 2021-08-05 DIAGNOSIS — J45909 Unspecified asthma, uncomplicated: Secondary | ICD-10-CM | POA: Diagnosis present

## 2021-08-05 DIAGNOSIS — I951 Orthostatic hypotension: Secondary | ICD-10-CM | POA: Diagnosis present

## 2021-08-05 DIAGNOSIS — E669 Obesity, unspecified: Secondary | ICD-10-CM | POA: Diagnosis present

## 2021-08-05 DIAGNOSIS — R531 Weakness: Secondary | ICD-10-CM | POA: Diagnosis not present

## 2021-08-05 DIAGNOSIS — F0150 Vascular dementia without behavioral disturbance: Secondary | ICD-10-CM | POA: Diagnosis present

## 2021-08-05 DIAGNOSIS — I341 Nonrheumatic mitral (valve) prolapse: Secondary | ICD-10-CM | POA: Diagnosis present

## 2021-08-05 DIAGNOSIS — Z8616 Personal history of COVID-19: Secondary | ICD-10-CM | POA: Diagnosis not present

## 2021-08-05 DIAGNOSIS — I959 Hypotension, unspecified: Secondary | ICD-10-CM | POA: Diagnosis present

## 2021-08-05 DIAGNOSIS — Z955 Presence of coronary angioplasty implant and graft: Secondary | ICD-10-CM | POA: Diagnosis not present

## 2021-08-05 DIAGNOSIS — F1020 Alcohol dependence, uncomplicated: Secondary | ICD-10-CM | POA: Diagnosis present

## 2021-08-05 DIAGNOSIS — E871 Hypo-osmolality and hyponatremia: Secondary | ICD-10-CM | POA: Diagnosis present

## 2021-08-05 DIAGNOSIS — R296 Repeated falls: Secondary | ICD-10-CM | POA: Diagnosis present

## 2021-08-05 DIAGNOSIS — I129 Hypertensive chronic kidney disease with stage 1 through stage 4 chronic kidney disease, or unspecified chronic kidney disease: Secondary | ICD-10-CM | POA: Diagnosis present

## 2021-08-05 DIAGNOSIS — N179 Acute kidney failure, unspecified: Secondary | ICD-10-CM | POA: Diagnosis present

## 2021-08-05 DIAGNOSIS — N281 Cyst of kidney, acquired: Secondary | ICD-10-CM | POA: Diagnosis present

## 2021-08-05 DIAGNOSIS — I2510 Atherosclerotic heart disease of native coronary artery without angina pectoris: Secondary | ICD-10-CM | POA: Diagnosis present

## 2021-08-05 LAB — RESPIRATORY PANEL BY PCR

## 2021-08-05 LAB — CBC
HCT: 38.8 % — ABNORMAL LOW (ref 39.0–52.0)
Hemoglobin: 12.6 g/dL — ABNORMAL LOW (ref 13.0–17.0)
MCH: 33.6 pg (ref 26.0–34.0)
MCHC: 32.5 g/dL (ref 30.0–36.0)
MCV: 103.5 fL — ABNORMAL HIGH (ref 80.0–100.0)
Platelets: 151 10*3/uL (ref 150–400)
RBC: 3.75 MIL/uL — ABNORMAL LOW (ref 4.22–5.81)
RDW: 14.3 % (ref 11.5–15.5)
WBC: 8.1 10*3/uL (ref 4.0–10.5)
nRBC: 0 % (ref 0.0–0.2)

## 2021-08-05 LAB — BASIC METABOLIC PANEL
Anion gap: 9 (ref 5–15)
BUN: 32 mg/dL — ABNORMAL HIGH (ref 8–23)
CO2: 25 mmol/L (ref 22–32)
Calcium: 8.2 mg/dL — ABNORMAL LOW (ref 8.9–10.3)
Chloride: 100 mmol/L (ref 98–111)
Creatinine, Ser: 1.32 mg/dL — ABNORMAL HIGH (ref 0.61–1.24)
GFR, Estimated: 59 mL/min — ABNORMAL LOW (ref >60)
Glucose, Bld: 105 mg/dL — ABNORMAL HIGH (ref 70–99)
Potassium: 4.7 mmol/L (ref 3.5–5.1)
Sodium: 134 mmol/L — ABNORMAL LOW (ref 135–145)

## 2021-08-05 LAB — PROCALCITONIN: Procalcitonin: <0.1 ng/mL

## 2021-08-05 LAB — MRSA NEXT GEN BY PCR, NASAL: MRSA by PCR Next Gen: NOT DETECTED

## 2021-08-05 LAB — C-REACTIVE PROTEIN: CRP: 1.7 mg/dL — ABNORMAL HIGH (ref <1.0)

## 2021-08-05 LAB — STREP PNEUMONIAE URINARY ANTIGEN: Strep Pneumo Urinary Antigen: NEGATIVE

## 2021-08-05 LAB — LACTIC ACID, PLASMA: Lactic Acid, Venous: 1.4 mmol/L (ref 0.5–1.9)

## 2021-08-05 LAB — SEDIMENTATION RATE: Sed Rate: 20 mm/hr — ABNORMAL HIGH (ref 0–16)

## 2021-08-05 MED ORDER — ALBUTEROL SULFATE (2.5 MG/3ML) 0.083% IN NEBU
2.5000 mg | INHALATION_SOLUTION | RESPIRATORY_TRACT | Status: DC | PRN
  Administered 2021-08-06: 2.5 mg via RESPIRATORY_TRACT
  Filled 2021-08-05: qty 3

## 2021-08-05 MED ORDER — MOMETASONE FURO-FORMOTEROL FUM 200-5 MCG/ACT IN AERO
2.0000 | INHALATION_SPRAY | Freq: Two times a day (BID) | RESPIRATORY_TRACT | Status: DC
  Administered 2021-08-06 – 2021-08-09 (×7): 2 via RESPIRATORY_TRACT
  Filled 2021-08-05: qty 8.8

## 2021-08-05 MED ORDER — QUETIAPINE FUMARATE 100 MG PO TABS
100.0000 mg | ORAL_TABLET | Freq: Every day | ORAL | Status: DC
  Administered 2021-08-05 – 2021-08-08 (×5): 100 mg via ORAL
  Filled 2021-08-05 (×5): qty 1

## 2021-08-05 MED ORDER — HYDROCOD POLI-CHLORPHE POLI ER 10-8 MG/5ML PO SUER
5.0000 mL | Freq: Two times a day (BID) | ORAL | Status: DC | PRN
  Administered 2021-08-05 – 2021-08-08 (×7): 5 mL via ORAL
  Filled 2021-08-05 (×7): qty 5

## 2021-08-05 MED ORDER — ASPIRIN EC 81 MG PO TBEC
81.0000 mg | DELAYED_RELEASE_TABLET | Freq: Every day | ORAL | Status: DC
  Administered 2021-08-05 – 2021-08-09 (×5): 81 mg via ORAL
  Filled 2021-08-05 (×5): qty 1

## 2021-08-05 MED ORDER — LATANOPROST 0.005 % OP SOLN
1.0000 [drp] | Freq: Every day | OPHTHALMIC | Status: DC
  Administered 2021-08-05 – 2021-08-08 (×5): 1 [drp] via OPHTHALMIC
  Filled 2021-08-05: qty 2.5

## 2021-08-05 MED ORDER — ESCITALOPRAM OXALATE 10 MG PO TABS
30.0000 mg | ORAL_TABLET | Freq: Every day | ORAL | Status: DC
  Administered 2021-08-05 – 2021-08-09 (×5): 30 mg via ORAL
  Filled 2021-08-05 (×5): qty 3

## 2021-08-05 MED ORDER — VITAMIN D 25 MCG (1000 UNIT) PO TABS
5000.0000 [IU] | ORAL_TABLET | Freq: Every day | ORAL | Status: DC
  Administered 2021-08-05 – 2021-08-09 (×5): 5000 [IU] via ORAL
  Filled 2021-08-05 (×5): qty 5

## 2021-08-05 MED ORDER — ALLOPURINOL 100 MG PO TABS
100.0000 mg | ORAL_TABLET | Freq: Every day | ORAL | Status: DC
  Administered 2021-08-05 – 2021-08-09 (×5): 100 mg via ORAL
  Filled 2021-08-05 (×6): qty 1

## 2021-08-05 MED ORDER — ATORVASTATIN CALCIUM 80 MG PO TABS
80.0000 mg | ORAL_TABLET | Freq: Every day | ORAL | Status: DC
  Administered 2021-08-05 – 2021-08-09 (×5): 80 mg via ORAL
  Filled 2021-08-05: qty 2
  Filled 2021-08-05 (×4): qty 1

## 2021-08-05 MED ORDER — RANOLAZINE ER 500 MG PO TB12
1000.0000 mg | ORAL_TABLET | Freq: Two times a day (BID) | ORAL | Status: DC
  Administered 2021-08-05 – 2021-08-09 (×9): 1000 mg via ORAL
  Filled 2021-08-05 (×10): qty 2

## 2021-08-05 MED ORDER — ROPINIROLE HCL ER 4 MG PO TB24
4.0000 mg | ORAL_TABLET | Freq: Every day | ORAL | Status: DC
  Administered 2021-08-05 – 2021-08-08 (×5): 4 mg via ORAL
  Filled 2021-08-05 (×6): qty 1

## 2021-08-05 MED ORDER — MAGNESIUM OXIDE -MG SUPPLEMENT 400 (240 MG) MG PO TABS
400.0000 mg | ORAL_TABLET | Freq: Every day | ORAL | Status: DC
  Administered 2021-08-05 – 2021-08-09 (×5): 400 mg via ORAL
  Filled 2021-08-05 (×5): qty 1

## 2021-08-05 MED ORDER — ACETAMINOPHEN 500 MG PO TABS
1000.0000 mg | ORAL_TABLET | Freq: Four times a day (QID) | ORAL | Status: DC | PRN
  Administered 2021-08-05 – 2021-08-08 (×3): 1000 mg via ORAL
  Filled 2021-08-05 (×3): qty 2

## 2021-08-05 MED ORDER — CLOPIDOGREL BISULFATE 75 MG PO TABS
75.0000 mg | ORAL_TABLET | Freq: Every day | ORAL | Status: DC
  Administered 2021-08-05 – 2021-08-09 (×5): 75 mg via ORAL
  Filled 2021-08-05 (×5): qty 1

## 2021-08-05 MED ORDER — MORPHINE SULFATE (PF) 2 MG/ML IV SOLN
2.0000 mg | Freq: Once | INTRAVENOUS | Status: AC
  Administered 2021-08-06: 2 mg via INTRAVENOUS
  Filled 2021-08-05: qty 1

## 2021-08-05 MED ORDER — MEMANTINE HCL 10 MG PO TABS
10.0000 mg | ORAL_TABLET | Freq: Two times a day (BID) | ORAL | Status: DC
  Administered 2021-08-05 – 2021-08-09 (×10): 10 mg via ORAL
  Filled 2021-08-05 (×11): qty 1

## 2021-08-05 MED ORDER — ENOXAPARIN SODIUM 60 MG/0.6ML IJ SOSY
60.0000 mg | PREFILLED_SYRINGE | INTRAMUSCULAR | Status: DC
  Administered 2021-08-05 – 2021-08-08 (×4): 60 mg via SUBCUTANEOUS
  Filled 2021-08-05 (×4): qty 0.6

## 2021-08-05 MED ORDER — PRIMIDONE 50 MG PO TABS
100.0000 mg | ORAL_TABLET | Freq: Two times a day (BID) | ORAL | Status: DC
  Administered 2021-08-05 – 2021-08-09 (×9): 100 mg via ORAL
  Filled 2021-08-05 (×11): qty 2

## 2021-08-05 MED ORDER — METOPROLOL TARTRATE 12.5 MG HALF TABLET
12.5000 mg | ORAL_TABLET | Freq: Two times a day (BID) | ORAL | Status: DC
  Administered 2021-08-05 – 2021-08-07 (×6): 12.5 mg via ORAL
  Filled 2021-08-05 (×6): qty 1

## 2021-08-05 MED ORDER — FERROUS SULFATE 325 (65 FE) MG PO TABS
325.0000 mg | ORAL_TABLET | Freq: Two times a day (BID) | ORAL | Status: DC
  Administered 2021-08-05 – 2021-08-09 (×9): 325 mg via ORAL
  Filled 2021-08-05 (×9): qty 1

## 2021-08-05 MED ORDER — ALUM & MAG HYDROXIDE-SIMETH 200-200-20 MG/5ML PO SUSP
30.0000 mL | Freq: Once | ORAL | Status: AC
  Administered 2021-08-05: 30 mL via ORAL
  Filled 2021-08-05: qty 30

## 2021-08-05 MED ORDER — PANTOPRAZOLE SODIUM 40 MG PO TBEC
40.0000 mg | DELAYED_RELEASE_TABLET | Freq: Every evening | ORAL | Status: DC
  Administered 2021-08-05 – 2021-08-08 (×4): 40 mg via ORAL
  Filled 2021-08-05 (×4): qty 1

## 2021-08-05 NOTE — Assessment & Plan Note (Signed)
Creatinine of 1.51 from prior of 1.19  ?-IV 75 cc LR fluid overnight ?-Avoid nephrotoxic agent.  Follow with repeat creatinine in the morning. ?

## 2021-08-05 NOTE — Consult Note (Signed)
? ?NAME:  Mark Thomas, MRN:  [id number], DOB:  [birth year], LOS: 0 ?ADMISSION DATE:  08/04/2021, CONSULTATION DATE:  4/6 ?REFERRING MD:  Dr. Karleen Hampshire, CHIEF COMPLAINT:  CAP  ? ?History of Present Illness:  ?Patient is a 71 yo M w/ pertinent PMH of HTN, CAD s/p stent, CVA, GERD, paroxysmal A-fib s/p Watchman device, CKD stage III, asthma presents to [facility] ED on 4/5 with possible fall and hypotension. ? ?Patient states that he fell 4/4 and 4/5.  States he felt dizzy and fell face first.  EMS called and patient transported to [facility] [place] ED on 4/5. BP initially 70/60 in ED. No acute fracture appreciated on imaging. WBC 13.1. creat 1.51. Lactic acid 2.2. BNP 210. Bcx2 sent; expectorated sputum pending but patient unable to cough. Covid/flu negative. BP has resolved.  CXR w/ b/l pleural effusions. CT chest: no PE; tree-in-bud and patchy ground-glass opacities in b/l upper lobes likely infection/inflammation; stable mild peripheral interstitial opacities in lower lungs likely ILD.[facility] admitting. ? ?Patient was positive for COVID in 04/2021.  Since then has had multiple issues of recurrent pneumonia requiring frequent ED admissions in 2023.  In January 2023 treated with Augmentin/azithromycin.  February 2023-Levaquin and prednisone; patient states he did not take prednisone due to worsening depression.  CT on 06/22/2021 showing groundglass nodules and RUL favoring pneumonia with mild ILD L lung base.  2/24 patient was given Rocephin/Zithromax and given steroids.  On 3/1 patient with expiratory wheezing and given another course of prednisone.  On 3/29 patient still with persistent symptoms given Decadron and azithromycin. PCCM consulted. ? ?Patient states he is not a smoker. He worked in [employer]. Was recently started on Symbicort January 2023 and he was taking albuterol nebs once a day. Wife states he uses his albuterol inhaler multiple times a day. Patient does not tolerate prednisone because it worsens his depression. Also, states  he was given bactrim this year and had to immediately stop because he developed a rash. ? ? ?Pertinent  Medical History  ? ?Past Medical History:  ?Diagnosis Date  ? Anxiety   ? Coronary artery disease   ? Depression   ? Essential tremor   ? GERD (gastroesophageal reflux disease)   ? High cholesterol   ? Hypertension   ? Myocardial infarct [facility])   ? Orthostatic hypotension   ? Stroke [facility])   ? Vascular dementia ([place])   ? ? ? ?Significant Hospital Events: ?Including procedures, antibiotic start and stop dates in addition to other pertinent events   ?4/5: admitted to [facility] with pneumonia ?4/6: pccm consulted ? ?Interim History / Subjective:  ?Patient laying in bed in NAD ?On room air ?States that his lungs are burning ? ?Objective   ?Blood pressure (!) 147/128, pulse 74, temperature 98.1 ?F (36.7 ?C), temperature source Oral, resp. rate 17, weight 113.4 kg, SpO2 97 %. ?   ?   ? ?Intake/Output Summary (Last 24 hours) at 08/05/2021 1054 ?Last data filed at 08/05/2021 0736 ?Gross per 24 hour  ?Intake 1200 ml  ?Output --  ?Net 1200 ml  ? ?Filed Weights  ? 08/04/21 1503  ?Weight: 113.4 kg  ? ? ?Examination: ?General:   NAD ?HEENT: MM pink/moist ?Neuro: Aox3; MAE ?CV: s1s2, RRR ?PULM:  dim clear BS bilaterally; on room air ?GI: soft, bsx4 active  ?Extremities: warm/dry, trace ble edema  ?Skin: no rashes or lesions appreciated ? ? ?Resolved Hospital Problem list   ? ? ?Assessment & Plan:  ? ?CAP: Ct chest tree-in-bud and  patchy ground-glass opacities in b/l upper lobes likely infection/inflammation; stable mild peripheral interstitial opacities in lower lungs likely ILD ?-Possible post covid complications; recurrent since post-covid in December undergoing multiple rounds of abx/steroids ?Hx of asthma: flovent started jan 2023; prior to covid last december on albuterol inhaler which he uses about once a month; PFT's done over 12 years ago in [place] but no results on file ?Plan: ?-continue vanc/cefepime ?-urine strep  negative ?-f/u legionella ?-follow bcx2 ?-attempt to send expectorated sputum; will put in order for sputum induction due to patient unable to cough up anything; consider bronchoscopy if sputum induction fails  ?-check PCT ?-check mrsa pcr; consider stopping vanc ?-Pulm hygiene: IS/Flutter ?-continue dulera and prn albuterol for wheezing ?-consider restarting steroids? ?-will need further f/u with pulmonary outpatient ? ? ?Other medical needs per primary ? ?Best Practice (right click and "Reselect all SmartList Selections" daily)  ? ?Per primary ? ?Labs   ?CBC: ?Recent Labs  ?Lab 08/04/21 ?1452 08/04/21 ?1535 08/05/21 ?0703  ?WBC 13.1*  --  8.1  ?NEUTROABS 7.9*  --   --   ?HGB 14.6 15.6 12.6*  ?HCT 45.3 46.0 38.8*  ?MCV 103.7*  --  103.5*  ?PLT 237  --  151  ? ? ?Basic Metabolic Panel: ?Recent Labs  ?Lab 08/04/21 ?1452 08/04/21 ?1535 08/05/21 ?0703  ?NA 136 133* 134*  ?K 4.8 4.5 4.7  ?CL 98 99 100  ?CO2 27  --  25  ?GLUCOSE 81 82 105*  ?BUN 28* 32* 32*  ?CREATININE 1.51* 1.40* 1.32*  ?CALCIUM 9.0  --  8.2*  ?MG 2.3  --   --   ? ?GFR: ?Estimated Creatinine Clearance: 70.7 mL/min (A) (by C-G formula based on SCr of 1.32 mg/dL (H)). ?Recent Labs  ?Lab 08/04/21 ?1452 08/04/21 ?1453 08/04/21 ?1635 08/04/21 ?2049 08/05/21 ?9811 08/05/21 ?0703  ?WBC 13.1*  --   --   --   --  8.1  ?LATICACIDVEN  --  2.2* 2.2* 1.8 1.4  --   ? ? ?Liver Function Tests: ?Recent Labs  ?Lab 08/04/21 ?1452  ?AST 54*  ?ALT 64*  ?ALKPHOS 61  ?BILITOT 0.9  ?PROT 6.3*  ?ALBUMIN 3.3*  ? ?No results for input(s): LIPASE, AMYLASE in the last 168 hours. ?No results for input(s): AMMONIA in the last 168 hours. ? ?ABG ?   ?Component Value Date/Time  ? TCO2 29 08/04/2021 1535  ?  ? ?Coagulation Profile: ?No results for input(s): INR, PROTIME in the last 168 hours. ? ?Cardiac Enzymes: ?No results for input(s): CKTOTAL, CKMB, CKMBINDEX, TROPONINI in the last 168 hours. ? ?HbA1C: ?Hgb A1c MFr Bld  ?Date/Time Value Ref Range Status  ?01/08/2021 01:38 AM 6.0 (H)  4.8 - 5.6 % Final  ?  Comment:  ?  (NOTE) ?Pre diabetes:          5.7%-6.4% ? ?Diabetes:              >6.4% ? ?Glycemic control for   <7.0% ?adults with diabetes ?  ? ? ?CBG: ?No results for input(s): GLUCAP in the last 168 hours. ? ?Review of Systems:   ?Review of Systems  ?Constitutional:  Negative for fever and weight loss.  ?Respiratory:  Positive for cough and shortness of breath. Negative for sputum production and wheezing.   ?Cardiovascular:  Negative for chest pain and leg swelling.  ? ? ?Past Medical History:  ?He,  has a past medical history of Anxiety, Coronary artery disease, Depression, Essential tremor, GERD (gastroesophageal reflux disease), High  cholesterol, Hypertension, Myocardial infarct ([place]), Orthostatic hypotension, Stroke [facility]), and Vascular dementia ([place]).  ? ?Surgical History:  ? ?Past Surgical History:  ?Procedure Laterality Date  ? APPENDECTOMY    ? CARDIAC SURGERY    ? CHOLECYSTECTOMY    ? HERNIA REPAIR    ? NASAL SINUS SURGERY    ? SHOULDER SURGERY    ?  ? ?Social History:  ? reports that he has never smoked. He has never been exposed to tobacco smoke. He has never used smokeless tobacco. He reports that he does not currently use alcohol. He reports that he does not use drugs.  ? ?Family History:  ?His family history includes Hypertension in his father and mother; Stroke in his father and mother.  ? ?Allergies ?Allergies  ?Allergen Reactions  ? Dilaudid [Hydromorphone] Other (See Comments)  ?  Respiratory Arrest  ? Bactrim [Sulfamethoxazole-Trimethoprim] Rash  ? Benadryl [Diphenhydramine] Other (See Comments)  ?  Muscle spasm   ? Phenobarbital   ?  ? ?Home Medications  ?Prior to Admission medications   ?Medication Sig Start Date End Date Taking? Authorizing Provider  ?acetaminophen (TYLENOL) 500 MG tablet Take 1,000 mg by mouth every 6 (six) hours as needed for mild pain.   Yes [provider]  ?albuterol (PROVENTIL) (2.5 MG/3ML) 0.083% nebulizer solution Take 3 mLs (2.5 mg  total) by nebulization every 6 (six) hours as needed for wheezing or shortness of breath. 07/15/21  Yes Luetta Nutting, DO  ?albuterol (VENTOLIN HFA) 108 (90 Base) MCG/ACT inhaler Inhale 2 puffs into the lungs every 6 (

## 2021-08-05 NOTE — ED Notes (Signed)
Pt c/o indigestion. RN explained his Protonix is only ordered for 6pm. RN paged MD for indigestion medication.  ?

## 2021-08-05 NOTE — Progress Notes (Signed)
Patient to MRI from ED for brain wo contrast scan. This RN attempted to perform patients scan however patient stated" that he wanted to be sedated to get this scan done". Patients floor RN unable to watch patient for study due to patient having pacemaker needing programming. Patient refused today and said he would come back tomorrow if he was medicated.  ?

## 2021-08-05 NOTE — Telephone Encounter (Signed)
Please schedule hospital follow up with me in 2-4 weeks for ILD.  ? ?Thanks, ?JD ?

## 2021-08-05 NOTE — Progress Notes (Signed)
? ? ? [facility] ?                                                                            ? ? ?Mark Thomas, is a 71 y.o. male, DOB - 1950/06/04, HGD:[id number] ?Admit date - 08/04/2021    ?Outpatient Primary MD for the patient is Mark Nutting, DO ? ?LOS - 0  days ? ? ? ?Brief summary  ? ? ?Patient is a 71 yo M w/ pertinent PMH of HTN, CAD s/p stent, CVA, GERD, paroxysmal A-fib s/p Watchman device, CKD stage III, asthma presents to [facility] ED on 4/5 with possible fall and hypotension. ? ?Assessment & Plan  ? ? ?Assessment and Plan: ? ?Recurrent Pneumonia: Post covid  recurrent pneumonia:  ?S/p multiple rounds of antibiotics and steroids.  ? ?CTA chest showed no pulmonary embolism but had tree-in-bud and patchy groundglass opacity in bilateral upper lobe compatible with infection/inflammation. Possible post-covid complications. Stable mild peripheral interstitial opacities in the lower lung likely interstitial lung disease. ? ?-This has been recurrent and persistent since post-COVID in December. He has underwent multiple rounds of antibiotics and steroids with minimal improvement.   ?- requested PCCM consult for recommendations.  ?-continue with V vancomycin and cefepime.  Obtain sputum culture, Legionella and strep pneumo urine antigen. ?- SLP evaluation.  ? ?Kidney cysts ?Mild hyperdense area in the kidneys may was present proteinaceous cysts and recommends further outpatient eval with ultrasound or MRI. ? ? ?Fall at home, initial encounter ?Likely secondary to hypotension.  However wife mention brief episode of possible facial droop and dysarthria.   ?Will order Mri Brain for further evaluation.  ?Currently no focal deficits.  ? ? ?Hypotension ?Resolved in ED.  ? ?Acute renal failure superimposed on stage 3a chronic kidney disease ([place]) ?Creatinine of 1.51 from prior of 1.19  ?Creatinine improving with IV fluids.  ?Creatinine is 1.32 this am.  ? ?Elevated  lactic acid:  ?Resolved.  ? ? ? ? ?Estimated  body mass index is 32.98 kg/m? as calculated from the following: ?  Height as of 07/28/21: _0  (1.854 m). ?  Weight as of this encounter: 113.4 kg. ? ?Code Status: FULL CODE ?DVT Prophylaxis:   ? ? ?Level of Care: Level of care: Telemetry Medical ?Family Communication: Updated patient's wife at bedside.  ? ?Disposition Plan:     Remains inpatient appropriate:  IV antibiotics.  ? ?Procedures:  ?None.  ? ?Consultants:   ?PCCM ? ?Antimicrobials:  ? ?Anti-infectives (From admission, onward)  ? ? Start     Dose/Rate Route Frequency Ordered Stop  ? 08/04/21 2100  ceFEPIme (MAXIPIME) 2 g in sodium chloride 0.9 % 100 mL IVPB       ? 2 g ?200 mL/hr over 30 Minutes Intravenous Every 8 hours 08/04/21 2029    ? 08/04/21 2030  vancomycin (VANCOREADY) IVPB 1500 mg/300 mL       ? 1,500 mg ?150 mL/hr over 120 Minutes Intravenous Every 24 hours 08/04/21 2029    ? 08/04/21 1845  cefTRIAXone (ROCEPHIN) 1 g in sodium chloride 0.9 % 100 mL IVPB  Status:  Discontinued       ? 1 g ?200 mL/hr over 30 Minutes  Intravenous  Once 08/04/21 1839 08/04/21 1943  ? 08/04/21 1845  doxycycline (VIBRA-TABS) tablet 100 mg       ? 100 mg Oral  Once 08/04/21 1839 08/04/21 1917  ? ?  ? ? ? ?Medications ? ?Scheduled Meds: ? allopurinol  100 mg Oral Daily  ? aspirin EC  81 mg Oral Daily  ? atorvastatin  80 mg Oral Daily  ? cholecalciferol  5,000 Units Oral Daily  ? clopidogrel  75 mg Oral Daily  ? enoxaparin (LOVENOX) injection  60 mg Subcutaneous Q24H  ? escitalopram  30 mg Oral Daily  ? ferrous sulfate  325 mg Oral BID WC  ? latanoprost  1 drop Both Eyes QHS  ? magnesium oxide  400 mg Oral Daily  ? memantine  10 mg Oral BID  ? metoprolol tartrate  12.5 mg Oral BID  ? mometasone-formoterol  2 puff Inhalation BID  ?  morphine injection  2 mg Intravenous Once  ? pantoprazole  40 mg Oral QPM  ? primidone  100 mg Oral BID  ? QUEtiapine  100 mg Oral QHS  ? ranolazine  1,000 mg Oral BID  ? rOPINIRole  4 mg Oral QHS  ? ?Continuous Infusions: ? ceFEPime  (MAXIPIME) IV 2 g (08/05/21 1538)  ? vancomycin Stopped (08/05/21 0006)  ? ?PRN Meds:.acetaminophen, albuterol, chlorpheniramine-HYDROcodone ? ? ? ?Subjective:  ? ?[name] was seen and examined today.  Coughing, not feeling well .  ?Some sob.  ? ?Objective:  ? ?Vitals:  ? 08/05/21 1405 08/05/21 1410 08/05/21 1436 08/05/21 1437  ?BP: (!) 154/86 116/84 117/74   ?Pulse: 74 76 76   ?Resp: _0 ?Temp:   97.7 ?F (36.5 ?C)   ?TempSrc:   Oral   ?SpO2: 98% 99% 97% 98%  ?Weight:      ? ? ?Intake/Output Summary (Last 24 hours) at 08/05/2021 1708 ?Last data filed at 08/05/2021 1600 ?Gross per 24 hour  ?Intake 1566.18 ml  ?Output 375 ml  ?Net 1191.18 ml  ? ?Filed Weights  ? 08/04/21 1503  ?Weight: 113.4 kg  ? ? ? ?Exam ?General exam: Appears calm and comfortable  ?Respiratory system: Clear to auscultation. Respiratory effort normal. ?Cardiovascular system: S1 & S2 heard, RRR. No JVD, . No pedal edema. ?Gastrointestinal system: Abdomen is nondistended, soft and nontender. Normal bowel sounds heard. ?Central nervous system: Alert and oriented. No focal neurological deficits. ?Extremities: Symmetric 5 x 5 power. ?Skin: No rashes, lesions or ulcers ?Psychiatry:  Mood & affect appropriate.  ? ? ?Data Reviewed:  I have personally reviewed following labs and imaging studies ? ? ?CBC ?Lab Results  ?Component Value Date  ? WBC 8.1 08/05/2021  ? RBC 3.75 (L) 08/05/2021  ? HGB 12.6 (L) 08/05/2021  ? HCT 38.8 (L) 08/05/2021  ? MCV 103.5 (H) 08/05/2021  ? MCH 33.6 08/05/2021  ? PLT 151 08/05/2021  ? MCHC 32.5 08/05/2021  ? RDW 14.3 08/05/2021  ? LYMPHSABS 3.3 08/04/2021  ? MONOABS 1.5 (H) 08/04/2021  ? EOSABS 0.1 08/04/2021  ? BASOSABS 0.1 08/04/2021  ? ? ? ?Last metabolic panel ?Lab Results  ?Component Value Date  ? NA 134 (L) 08/05/2021  ? K 4.7 08/05/2021  ? CL 100 08/05/2021  ? CO2 25 08/05/2021  ? BUN 32 (H) 08/05/2021  ? CREATININE 1.32 (H) 08/05/2021  ? GLUCOSE 105 (H) 08/05/2021  ? GFRNONAA 59 (L) 08/05/2021  ? CALCIUM 8.2  (L) 08/05/2021  ? PROT 6.3 (L) 08/04/2021  ? ALBUMIN  3.3 (L) 08/04/2021  ? BILITOT 0.9 08/04/2021  ? ALKPHOS 61 08/04/2021  ? AST 54 (H) 08/04/2021  ? ALT 64 (H) 08/04/2021  ? ANIONGAP 9 08/05/2021  ? ? ?CBG (last 3)  ?No results for input(s): GLUCAP in the last 72 hours.  ? ? ?Coagulation Profile: ?No results for input(s): INR, PROTIME in the last 168 hours. ? ? ?Radiology Studies: ?CT Head Wo Contrast ? ?Result Date: 08/04/2021 ?CLINICAL DATA:  Fall EXAM: CT HEAD WITHOUT CONTRAST CT MAXILLOFACIAL WITHOUT CONTRAST CT CERVICAL SPINE WITHOUT CONTRAST TECHNIQUE: Multidetector CT imaging of the head, cervical spine, and maxillofacial structures were performed using the standard protocol without intravenous contrast. Multiplanar CT image reconstructions of the cervical spine and maxillofacial structures were also generated. RADIATION DOSE REDUCTION: This exam was performed according to the departmental dose-optimization program which includes automated exposure control, adjustment of the mA and/or kV according to patient size and/or use of iterative reconstruction technique. COMPARISON:  01/07/2021 CT head and CTA head and neck, 12/27/2020 CT cervical spine FINDINGS: CT HEAD FINDINGS Brain: No evidence of acute infarction, hemorrhage, cerebral edema, mass, mass effect, or midline shift. No hydrocephalus or extra-axial fluid collection. Redemonstrated encephalomalacia in the anterior frontal lobes. Periventricular white matter changes, likely the sequela of chronic small vessel ischemic disease. Left caudate and right corona radiata lacunar infarcts. Vascular: No hyperdense vessel. Atherosclerotic calcifications in the intracranial carotid and vertebral arteries. Skull: Prior frontal craniotomy. Negative for fracture or focal lesion. Other: None. CT MAXILLOFACIAL FINDINGS Osseous: No fracture or mandibular dislocation. No destructive process. Orbits: No acute finding. Unchanged irregularity of the medial orbital walls.  Sinuses: Unchanged apparent osseous defect in the anterior wall of the right maxillary sinus. Irregularity of the bilateral lamina papyracea. Redemonstrated diffuse osseous thickening, most prominently in the maxilla

## 2021-08-05 NOTE — Assessment & Plan Note (Signed)
Resolved in ED.  ?

## 2021-08-06 ENCOUNTER — Inpatient Hospital Stay (HOSPITAL_COMMUNITY): Payer: Medicare Other

## 2021-08-06 DIAGNOSIS — I959 Hypotension, unspecified: Secondary | ICD-10-CM | POA: Diagnosis not present

## 2021-08-06 DIAGNOSIS — J189 Pneumonia, unspecified organism: Secondary | ICD-10-CM | POA: Diagnosis not present

## 2021-08-06 DIAGNOSIS — R531 Weakness: Secondary | ICD-10-CM | POA: Diagnosis not present

## 2021-08-06 DIAGNOSIS — N179 Acute kidney failure, unspecified: Secondary | ICD-10-CM | POA: Diagnosis not present

## 2021-08-06 LAB — EXPECTORATED SPUTUM ASSESSMENT W GRAM STAIN, RFLX TO RESP C

## 2021-08-06 LAB — BASIC METABOLIC PANEL
Anion gap: 7 (ref 5–15)
BUN: 19 mg/dL (ref 8–23)
CO2: 25 mmol/L (ref 22–32)
Calcium: 8.4 mg/dL — ABNORMAL LOW (ref 8.9–10.3)
Chloride: 105 mmol/L (ref 98–111)
Creatinine, Ser: 1.1 mg/dL (ref 0.61–1.24)
GFR, Estimated: >60 mL/min (ref >60)
Glucose, Bld: 102 mg/dL — ABNORMAL HIGH (ref 70–99)
Potassium: 5.1 mmol/L (ref 3.5–5.1)
Sodium: 137 mmol/L (ref 135–145)

## 2021-08-06 LAB — CBC WITH DIFFERENTIAL/PLATELET
Abs Immature Granulocytes: 0.12 10*3/uL — ABNORMAL HIGH (ref 0.00–0.07)
Basophils Absolute: 0 10*3/uL (ref 0.0–0.1)
Basophils Relative: 0 %
Eosinophils Absolute: 0.2 10*3/uL (ref 0.0–0.5)
Eosinophils Relative: 2 %
HCT: 38.1 % — ABNORMAL LOW (ref 39.0–52.0)
Hemoglobin: 12.8 g/dL — ABNORMAL LOW (ref 13.0–17.0)
Immature Granulocytes: 2 %
Lymphocytes Relative: 25 %
Lymphs Abs: 2 10*3/uL (ref 0.7–4.0)
MCH: 34 pg (ref 26.0–34.0)
MCHC: 33.6 g/dL (ref 30.0–36.0)
MCV: 101.1 fL — ABNORMAL HIGH (ref 80.0–100.0)
Monocytes Absolute: 0.8 10*3/uL (ref 0.1–1.0)
Monocytes Relative: 10 %
Neutro Abs: 4.9 10*3/uL (ref 1.7–7.7)
Neutrophils Relative %: 61 %
Platelets: 139 10*3/uL — ABNORMAL LOW (ref 150–400)
RBC: 3.77 MIL/uL — ABNORMAL LOW (ref 4.22–5.81)
RDW: 14.3 % (ref 11.5–15.5)
WBC: 7.9 10*3/uL (ref 4.0–10.5)
nRBC: 0 % (ref 0.0–0.2)

## 2021-08-06 LAB — CK: Total CK: 23 U/L — ABNORMAL LOW (ref 49–397)

## 2021-08-06 IMAGING — US US RENAL
1 series · 14 of 25 positions shown · non-contrast
Comparison: CT abdomen and pelvis [DATE]

CLINICAL DATA: Abnormal CT, LEFT renal mass

EXAM:
RENAL / URINARY TRACT ULTRASOUND COMPLETE

[Series 1: us renal · 14 of 65 slices shown]
[im 1/65]
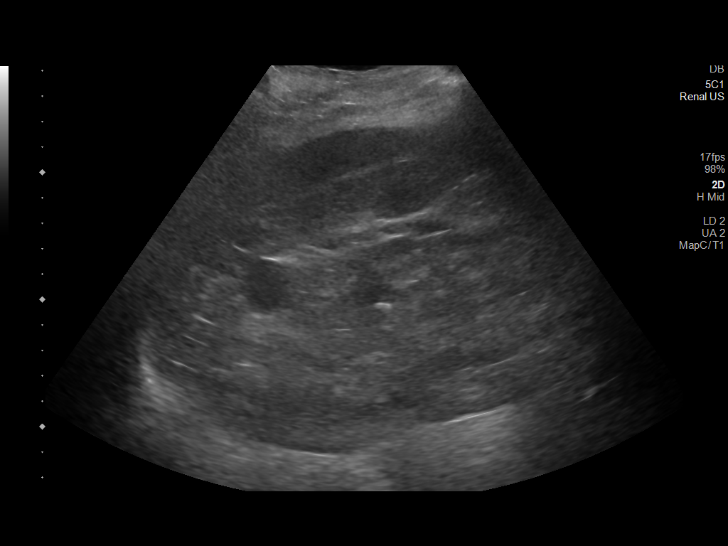
[im 6/65]
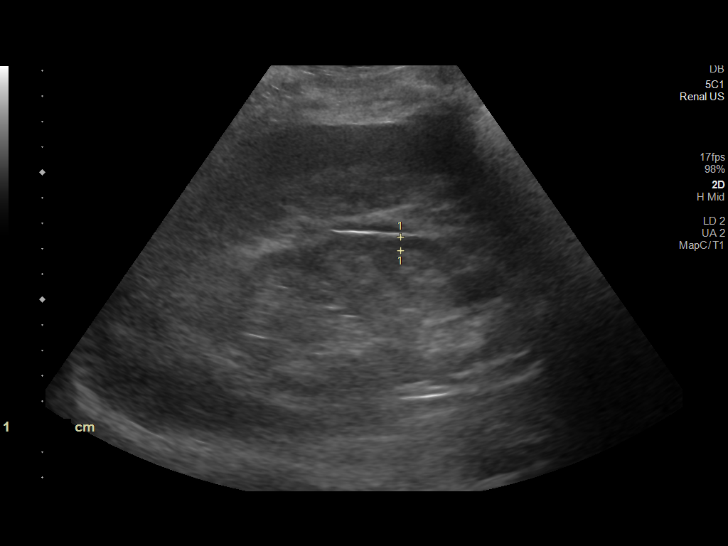
[im 11/65]
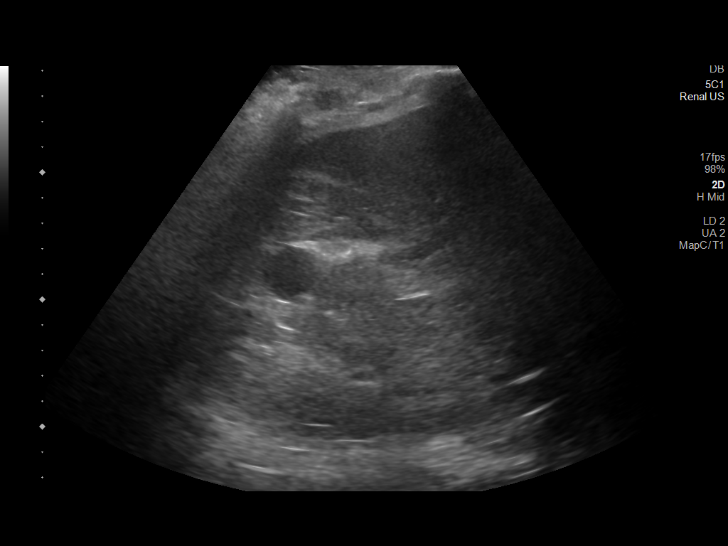
[im 17/65]
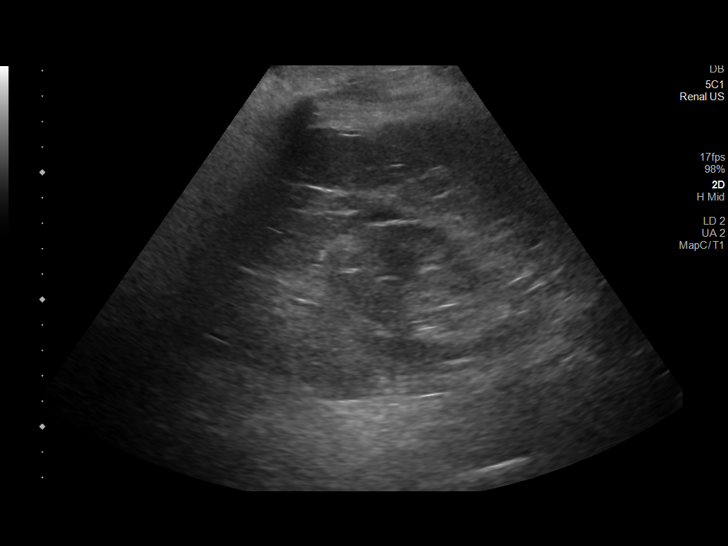
[im 22/65]
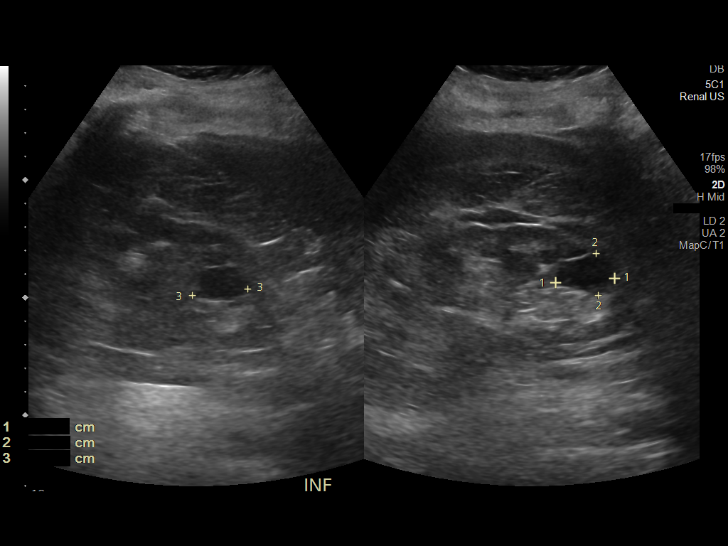
[im 25/65]
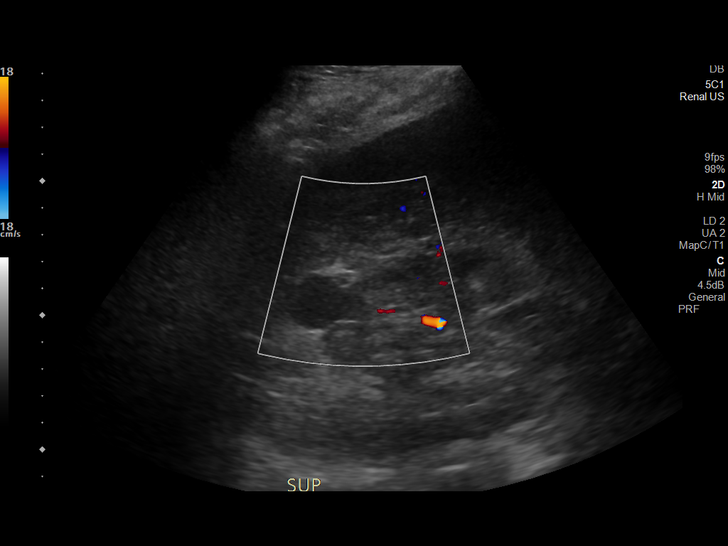
[im 30/65]
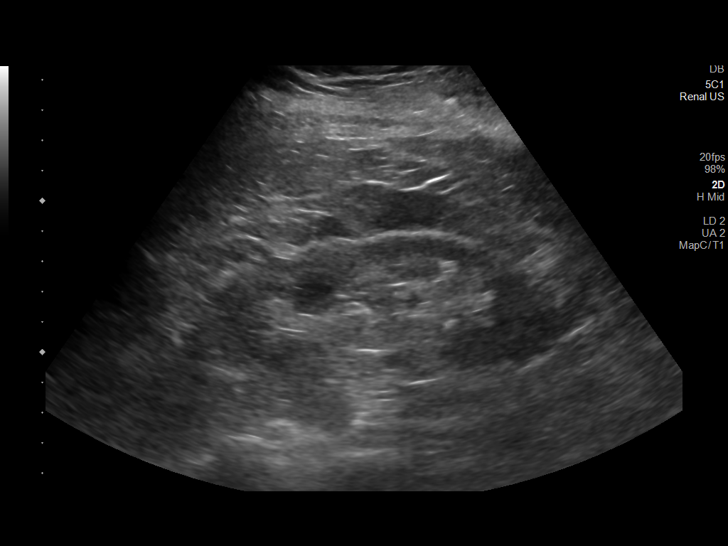
[im 35/65]
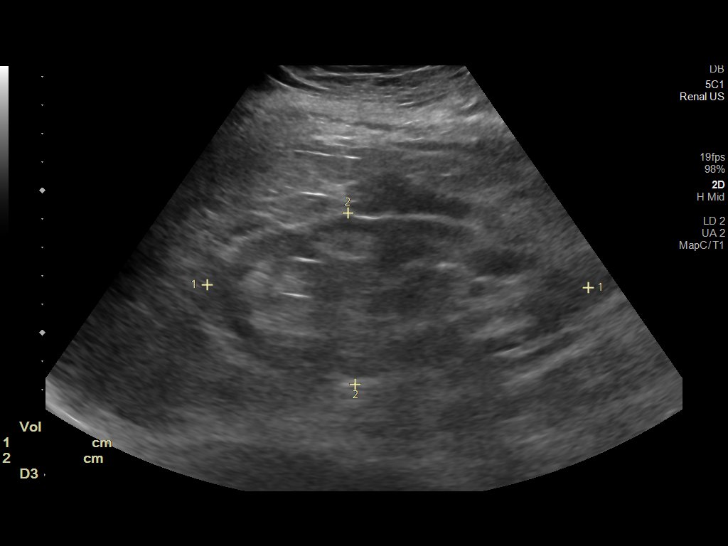
[im 41/65]
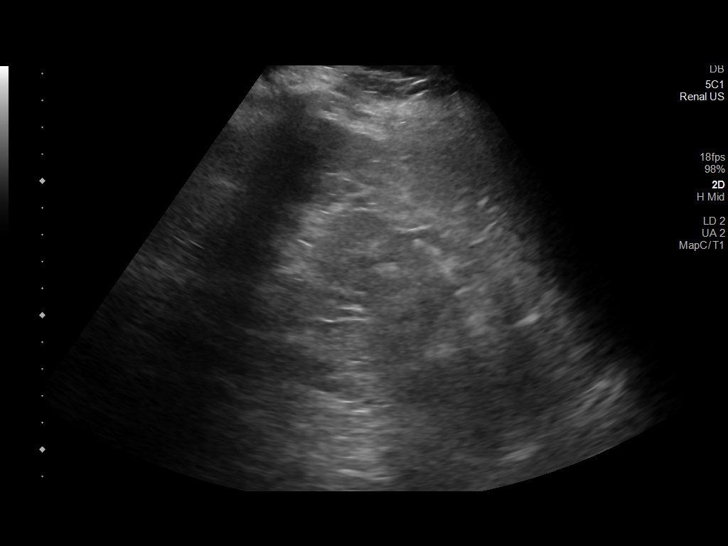
[im 43/65]
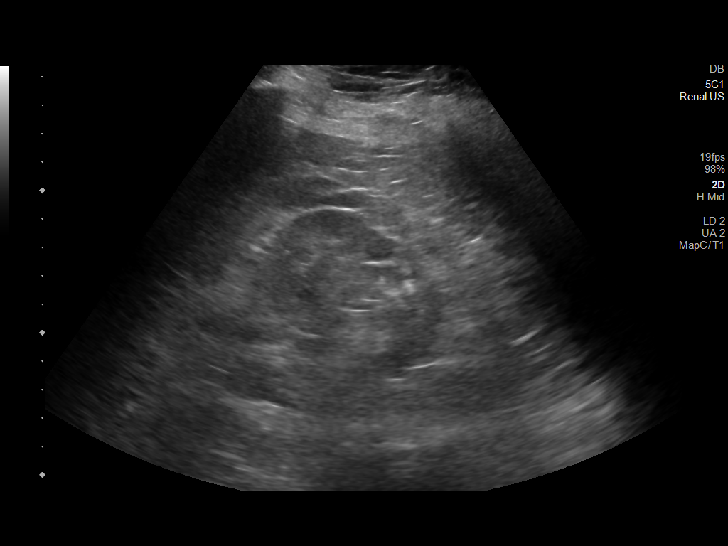
[im 49/65]
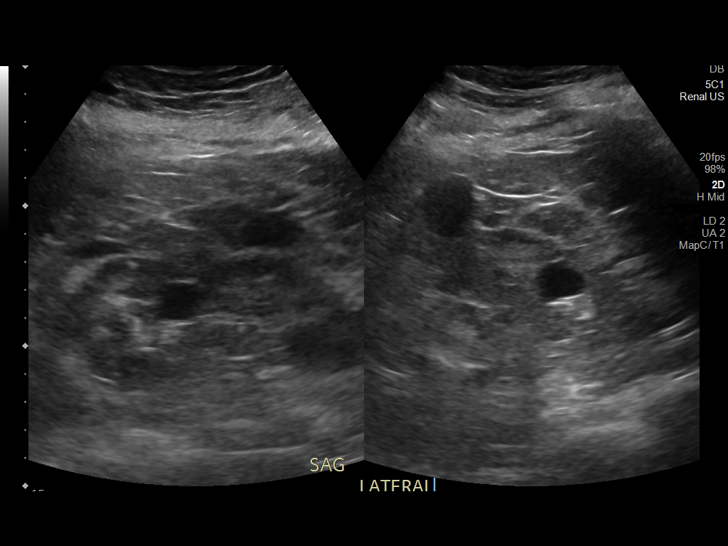
[im 54/65]
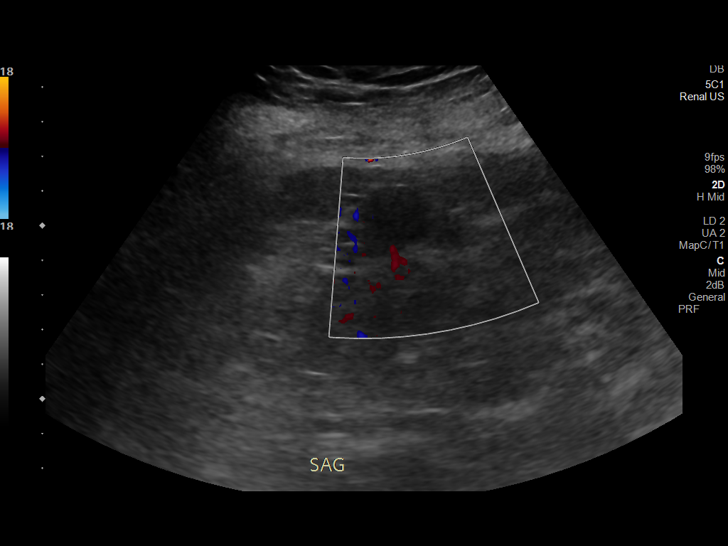
[im 59/65]
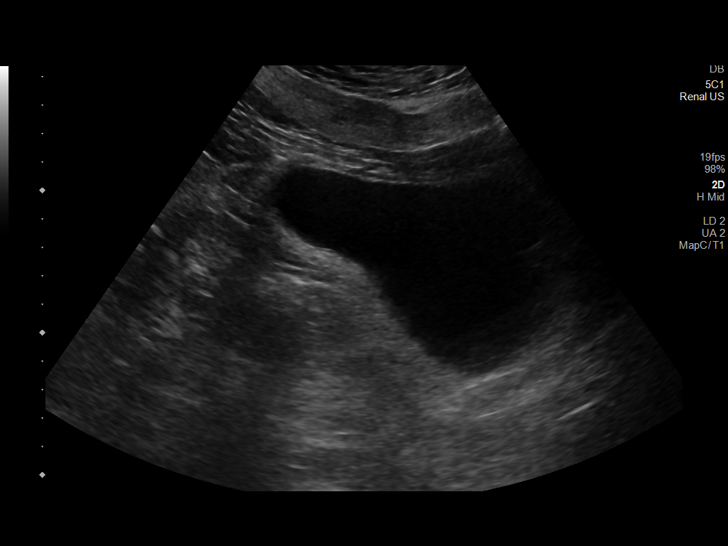
[im 65/65]
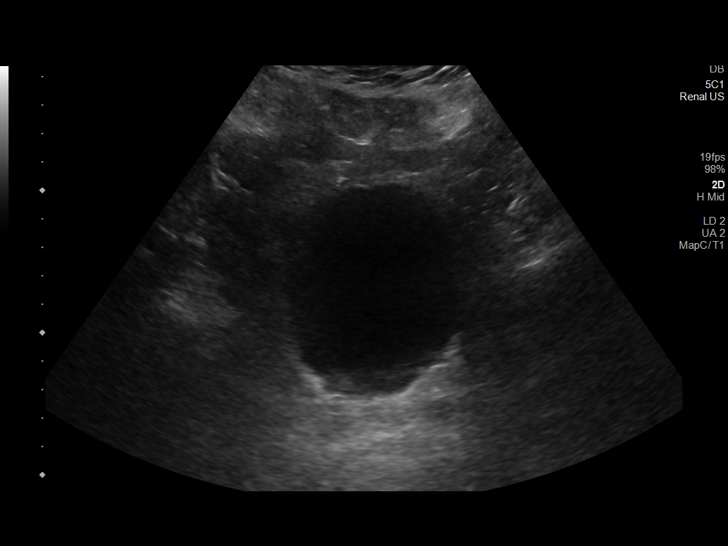

[14 of 25 positions shown; findings below may reference images not displayed]

FINDINGS: Right Kidney:

Renal measurements: 12.3 x 6.2 x 7.2 cm = volume: 287 mL. Marked
cortical thinning. Increased cortical echogenicity. No
hydronephrosis or shadowing calcification. Small cyst at inferior
pole 2.5 x 1.8 x 2.4 cm, simple features. Additional exophytic cyst
upper pole 2.5 x 2.2 x 2.4 cm, simple features.

Left Kidney:

Renal measurements: 13.4 x 6.0 x 4.5 cm = volume: 192 mL. Cortical
thinning. Increased cortical echogenicity. No hydronephrosis. 5 mm
nonshadowing echogenic focus mid kidney, question tiny calculus.
Small cyst at upper pole 1.7 x 1.4 x 1.8 cm, simple features.
Additional exophytic cyst mid kidney 2.2 x 2.4 x 2.3 cm, simple
features, corresponding to CT finding.

Bladder:

Appears normal for degree of bladder distention.

Other:

N/A
IMPRESSION: BILATERAL renal cysts.

No evidence of solid renal mass or hydronephrosis.

Questionable 5 mm nonshadowing calculus mid LEFT kidney.

## 2021-08-06 IMAGING — CT CT ABD-PELV W/O CM
2 of 4 series · 14 of 46 positions shown, 16 images · non-contrast
Comparison: CT of the chest acquired on [DATE].

CLINICAL DATA: RIGHT lower quadrant abdominal pain, bruising and
bleeding post fall.



[Series 3: a/p w/o 5mm · axial · non-contrast · 0.94mm/px · z∈[+995,+1460]mm · 11 of 113 slices shown, 13 images]
[im 10/113  soft-tissue]
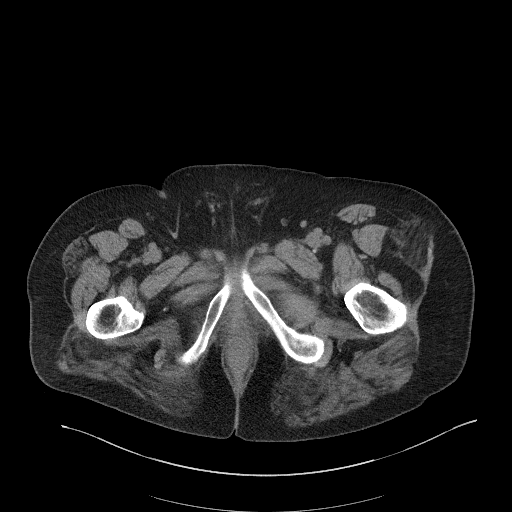
[im 10/113  bone]
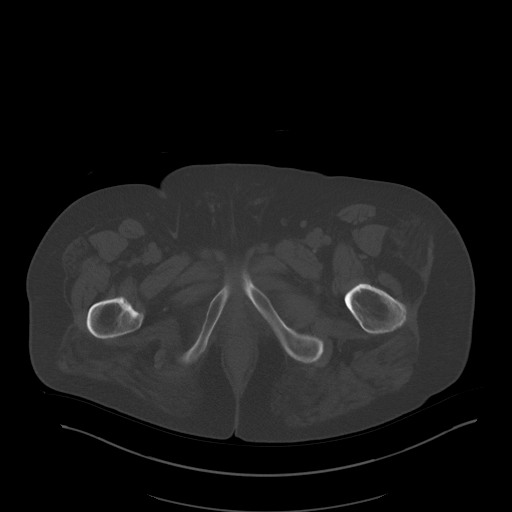
[im 19/113  soft-tissue]
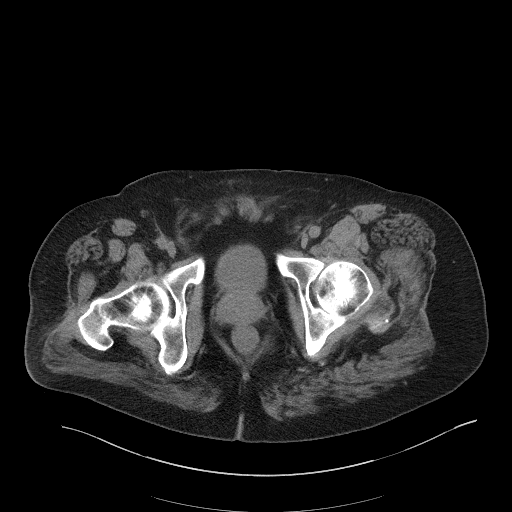
[im 29/113  soft-tissue]
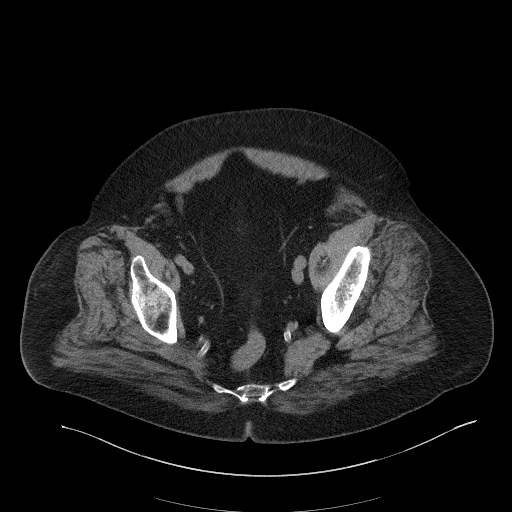
[im 38/113  soft-tissue]
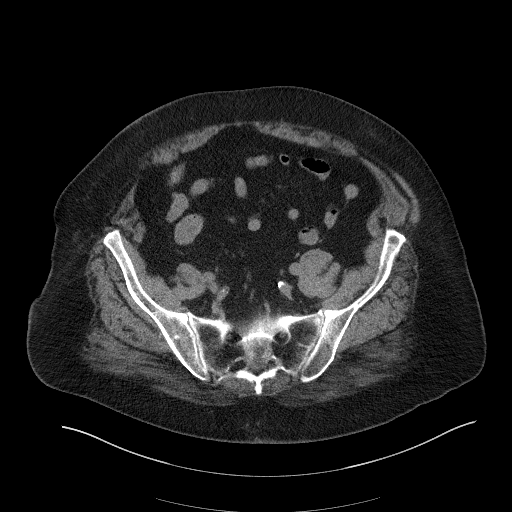
[im 47/113  soft-tissue]
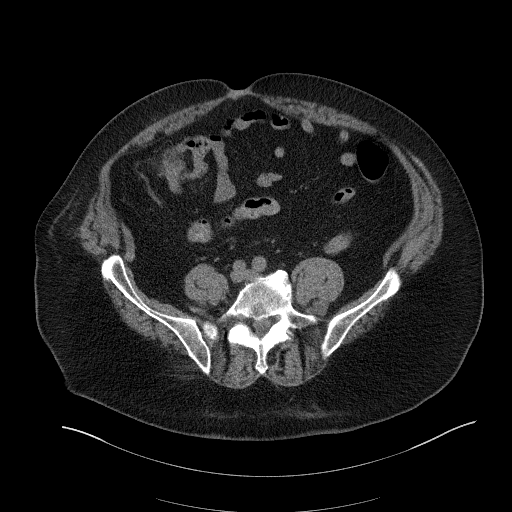
[im 57/113  soft-tissue]
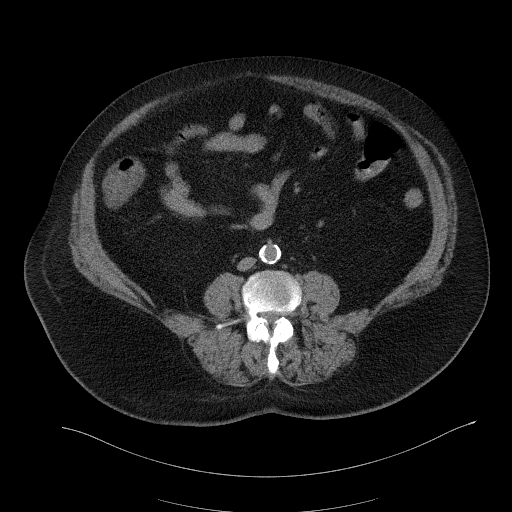
[im 66/113  soft-tissue]
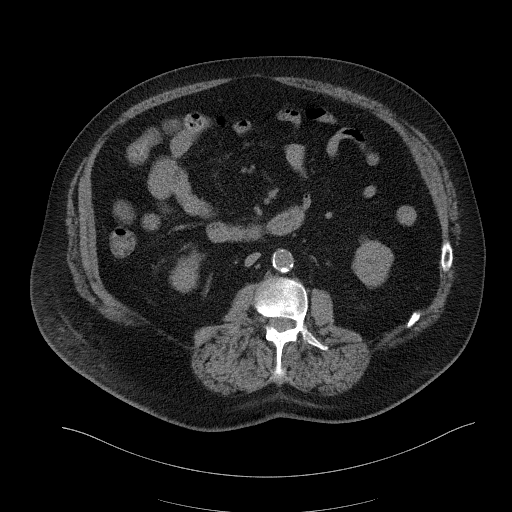
[im 75/113  soft-tissue]
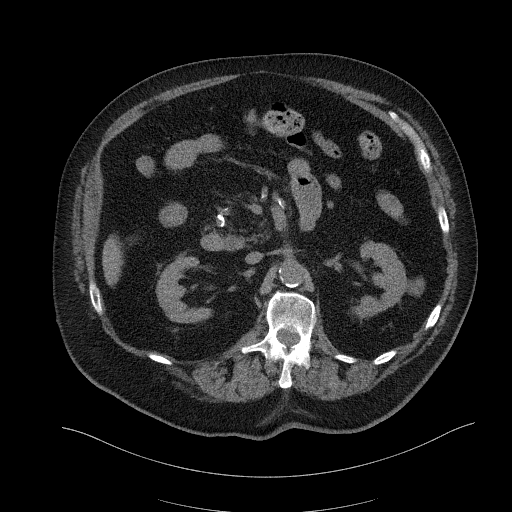
[im 85/113  soft-tissue]
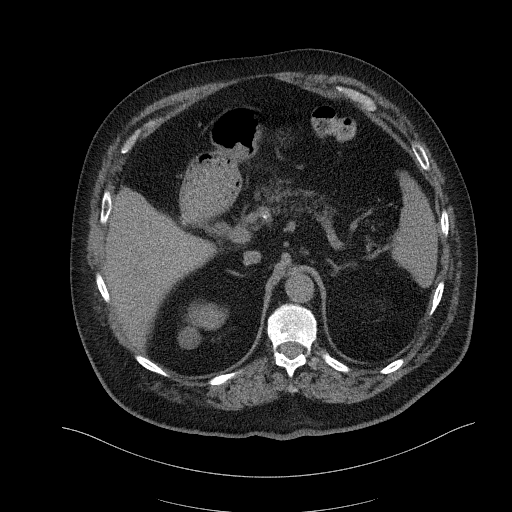
[im 85/113  bone]
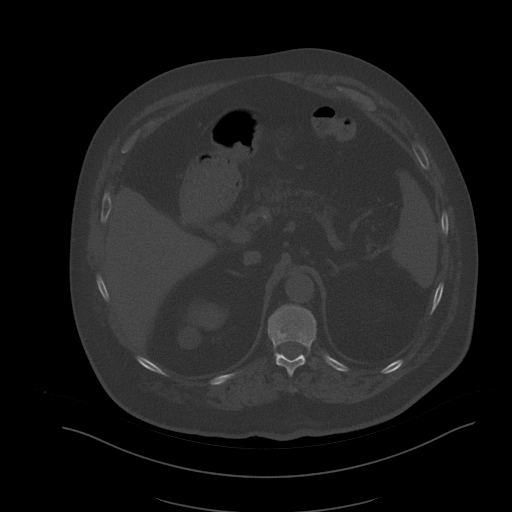
[im 94/113  soft-tissue]
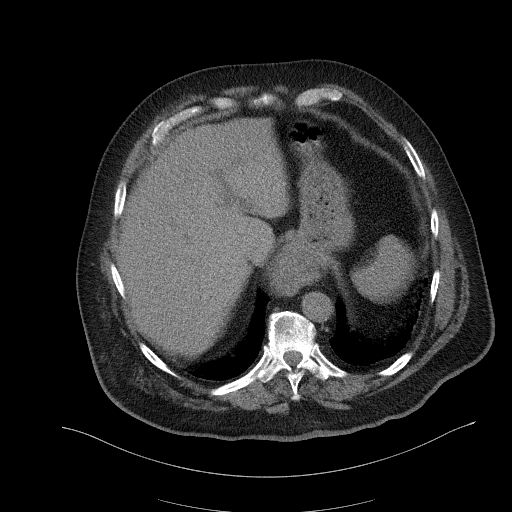
[im 103/113  soft-tissue]
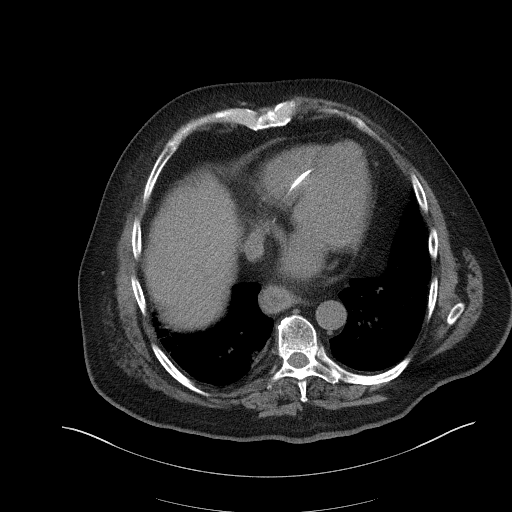

[Series 6: a/p w/o cor · coronal · non-contrast · 0.93mm/px · 3 of 181 slices shown]
[im 61/181  soft-tissue]
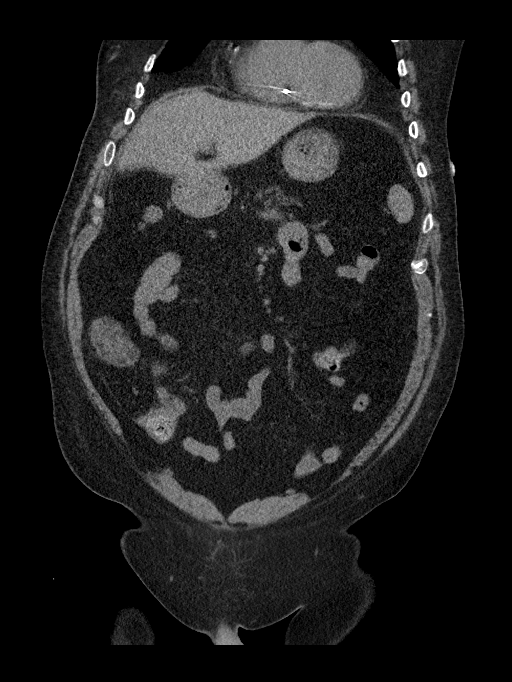
[im 81/181  soft-tissue]
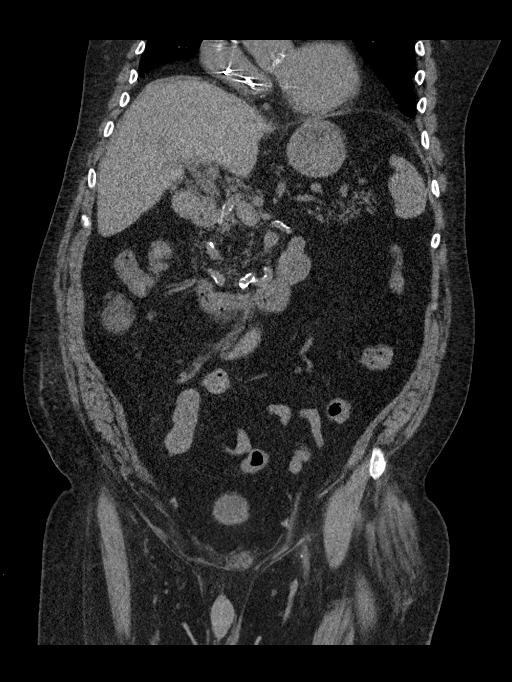
[im 101/181  soft-tissue]
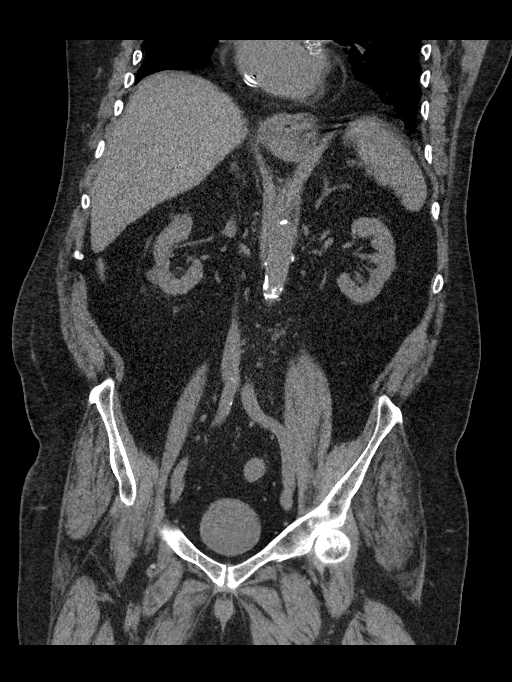

[14 of 46 positions shown; findings below may reference images not displayed]

FINDINGS: Lower chest: Basilar atelectasis and pleural and parenchymal
scarring similar to the prior study. Marked cardiomegaly with signs
of cardiac pacer device and watch min as well as coronary artery
disease, incompletely assessed. No substantial pericardial effusion.

Hepatobiliary: Liver with smooth contours. Post cholecystectomy.
Common bile duct up to 12 mm maximal caliber. No gross intrahepatic
biliary duct distension. No remote priors for comparison.

Pancreas: Pancreatic atrophy no signs of gross ductal dilation or
evidence of inflammation.

Spleen: Normal.

Adrenals/Urinary Tract: 2.1 x 2.1 cm 30 Hounsfield unit LEFT renal
lesion which is exophytic arising from the interpolar LEFT kidney.
No LEFT-sided hydronephrosis or perinephric stranding. The
nephrolithiasis with punctate calculus in the interpolar and lower
pole LEFT kidney.

RIGHT kidney with low-density lesions which are water density
compatible with cysts and not requiring follow-up.

Punctate calculus in the upper pole of the RIGHT kidney and 2
calculi also between 1 and 2 mm in the lower pole.

No perivesical stranding.  No ureteral calculi.

Stomach/Bowel: Moderate size hiatal hernia. Some motion in this area
limiting assessment but suggestion of mild adjacent stranding which
may be new from a very recent study acquired on [DATE]. No
signs of small bowel obstruction. No acute inflammatory process
involving the colon. Appendix not visualized, no secondary signs to
suggest appendicitis.

Vascular/Lymphatic:

Aortic atherosclerosis. No sign of aneurysm. Smooth contour of the
IVC. There is no gastrohepatic or hepatoduodenal ligament
lymphadenopathy. No retroperitoneal or mesenteric lymphadenopathy.

No pelvic sidewall lymphadenopathy.

Reproductive: Prostate with calcifications.

Other: No ascites.  No pneumoperitoneum.

Musculoskeletal: No acute bone finding. No destructive bone process.
Spinal degenerative changes.
IMPRESSION: 1. Dilation of the biliary tree post cholecystectomy, up to 12-13 mm
in terms of the common bile duct. Findings are of uncertain
significance and could represent this patient's baseline but should
be correlated with any clinical or laboratory evidence of biliary
obstruction.
2. Query mild stranding versus motion artifact about the distal
esophagus in proximal stomach. Correlate with any signs of
gastritis.
3. LEFT renal lesion measuring 2.1 x 2.1 cm which is exophytic
arising from the interpolar LEFT kidney. This could represent a
hemorrhagic cyst or solid renal neoplasm. Initial assessment could
be performed with ultrasound on a nonemergent basis to exclude solid
renal neoplasm.
4. Bilateral nephrolithiasis without evidence of hydronephrosis or
perinephric stranding.

Aortic Atherosclerosis ([1L]-[1L]).

These results will be called to the ordering clinician or
representative by the Radiologist Assistant, and communication
documented in the PACS or [REDACTED].

## 2021-08-06 IMAGING — MR MR HEAD W/O CM
11 of 12 series · 44 of 48 positions shown · non-contrast
Comparison: CT head [DATE], MR head [DATE]

CLINICAL DATA: Dizziness, possible slurred speech and facial droop
history of CAD, CVA, AFib

EXAM:
MRI HEAD WITHOUT CONTRAST
TECHNIQUE: Multiplanar, multiecho pulse sequences of the brain and surrounding
structures were obtained without intravenous contrast.

[Series 5: DWI · axial · 3.0mm · 0.96mm/px · z∈[-91,+56]mm · 7 of 100 slices shown (1 of 4)]
[im 1/100]
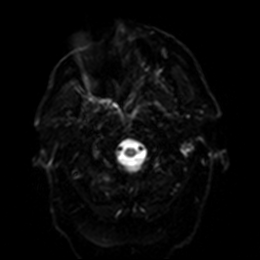
[im 17/100]
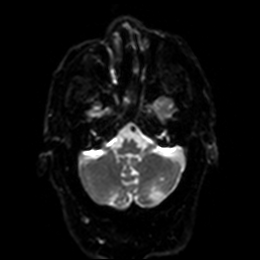
[im 34/100]
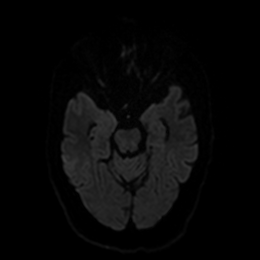
[im 50/100]
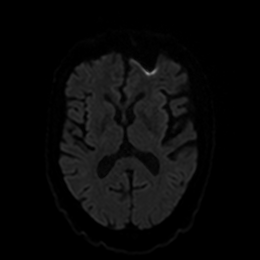
[im 67/100]
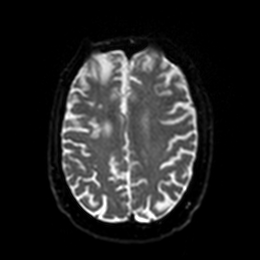
[im 83/100]
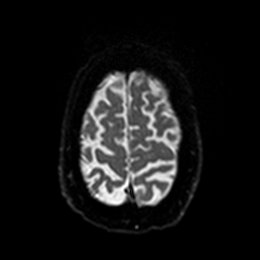
[im 100/100]
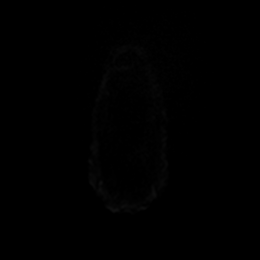

[Series 6: DWI · axial · 3.0mm · 0.96mm/px · z∈[-91,+56]mm · 4 of 50 slices shown (2 of 4)]
[im 1/50]
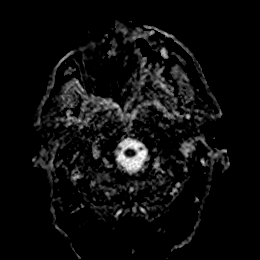
[im 17/50]
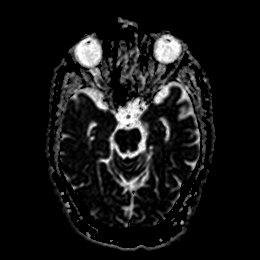
[im 33/50]
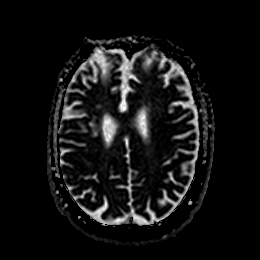
[im 50/50]
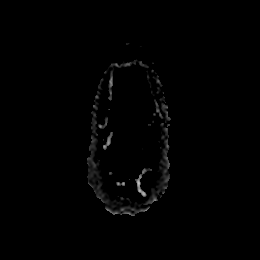

[Series 7: DWI · coronal · 4.0mm · 0.88mm/px · 5 of 70 slices shown (3 of 4)]
[im 1/70]
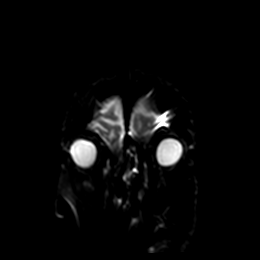
[im 18/70]
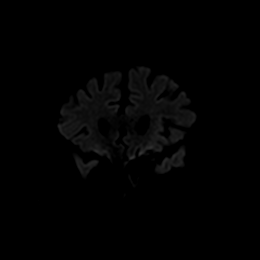
[im 35/70]
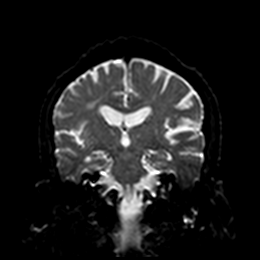
[im 52/70]
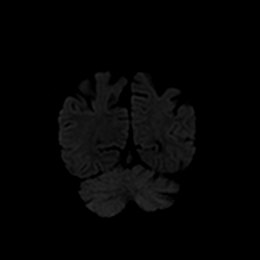
[im 70/70]
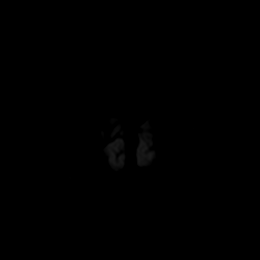

[Series 8: DWI · coronal · 4.0mm · 0.88mm/px · 3 of 35 slices shown (4 of 4)]
[im 1/35]
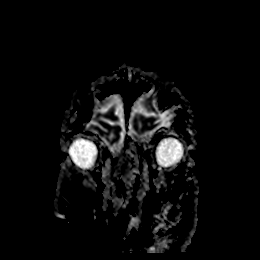
[im 18/35]
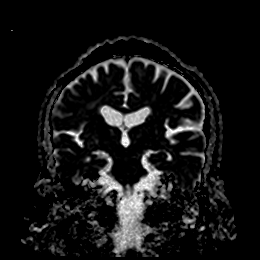
[im 35/35]
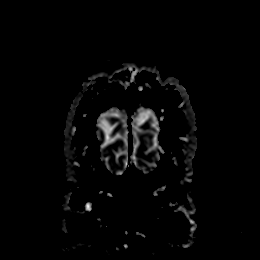

[Series 9: T1 · sagittal · 5.0mm · 0.75mm/px · 2 of 23 slices shown]
[im 1/23]
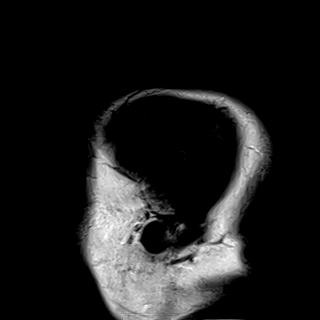
[im 23/23]
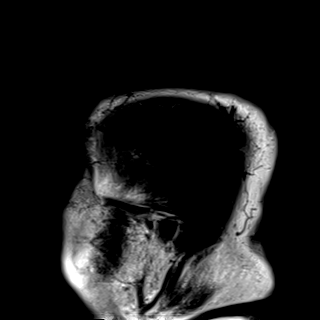

[Series 10: FLAIR · axial · 5.0mm · 0.49mm/px · z∈[-96,+60]mm · 2 of 27 slices shown]
[im 1/27]
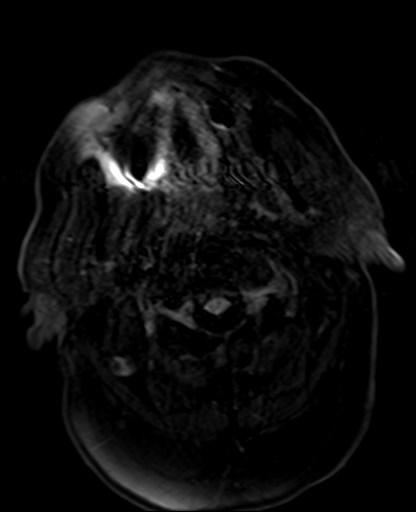
[im 27/27]
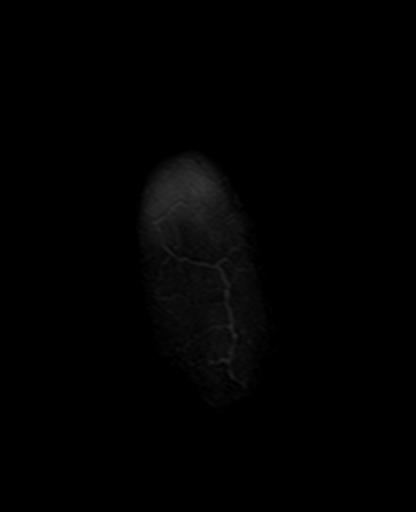

[Series 11: mag_images · axial · 3.0mm · 0.98mm/px · z∈[-106,+71]mm · 5 of 60 slices shown]
[im 1/60]
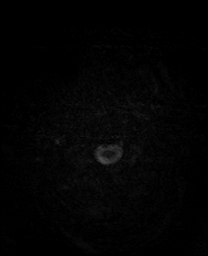
[im 15/60]
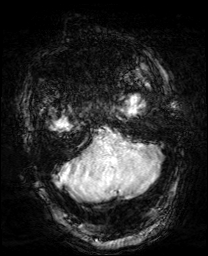
[im 30/60]
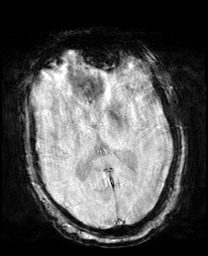
[im 45/60]
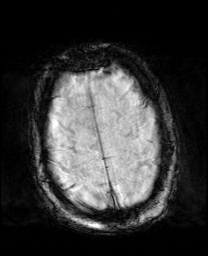
[im 60/60]
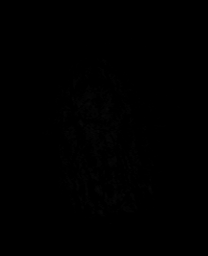

[Series 12: pha_images · axial · 3.0mm · 0.98mm/px · z∈[-103,+71]mm · 5 of 59 slices shown]
[im 1/59]
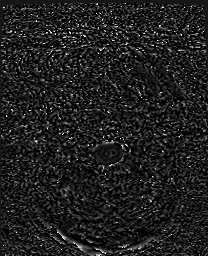
[im 15/59]
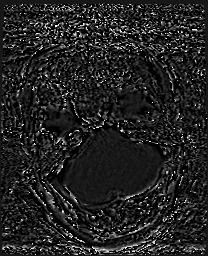
[im 30/59]
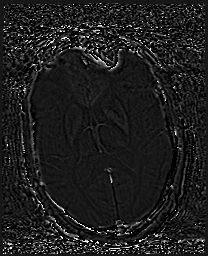
[im 44/59]
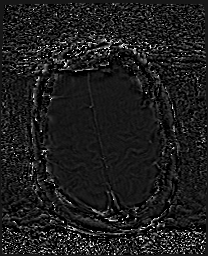
[im 59/59]
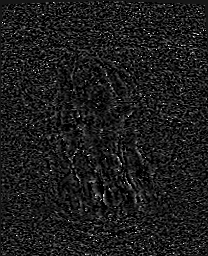

[Series 13: swi_images · axial · 3.0mm · 0.98mm/px · z∈[-106,+71]mm · 5 of 60 slices shown]
[im 1/60]
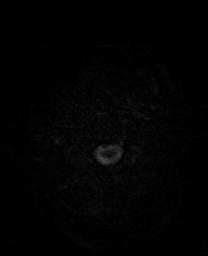
[im 15/60]
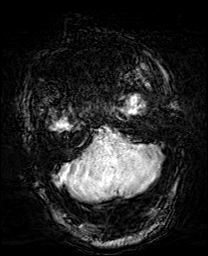
[im 30/60]
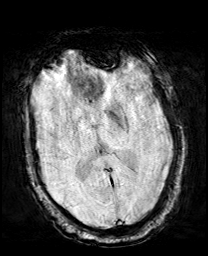
[im 45/60]
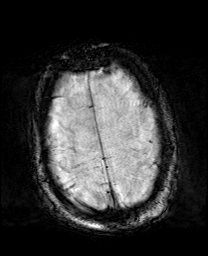
[im 60/60]
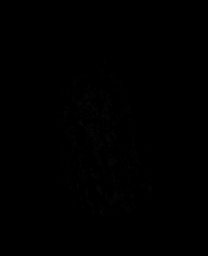

[Series 14: mip_images(sw) · axial · 24.0mm · 0.98mm/px · z∈[-96,+60]mm · 4 of 53 slices shown]
[im 1/53]
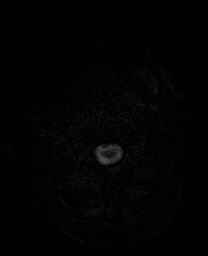
[im 18/53]
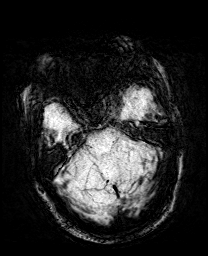
[im 35/53]
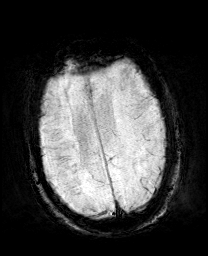
[im 53/53]
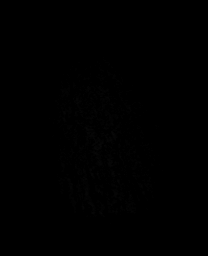

[Series 15: T2 · axial · 5.0mm · 0.78mm/px · z∈[-95,+60]mm · 2 of 27 slices shown]
[im 1/27]
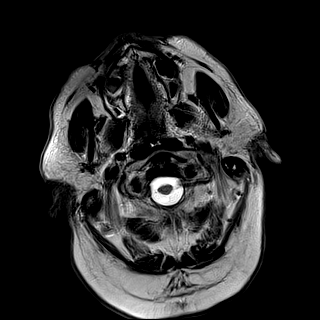
[im 27/27]
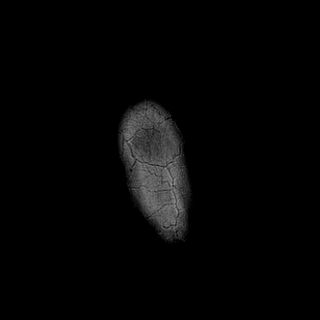

[44 of 48 positions shown; findings below may reference images not displayed]

FINDINGS: Brain: There is no evidence of acute intracranial hemorrhage,
extra-axial fluid collection, or acute infarct.

The ventricles are stable in size. Encephalomalacia in the bilateral
anterior frontal lobes is unchanged. There are remote infarcts in
the left caudate, left cerebellar hemisphere and right corona
radiata, unchanged since the prior MRI. Additional foci of FLAIR
signal abnormality in the subcortical and periventricular white
matter likely reflects sequela of chronic white matter
microangiopathy.

There is no mass lesion.  There is no mass effect or midline shift.

Vascular: Normal flow voids.

Skull and upper cervical spine: Postsurgical changes reflecting
bifrontal craniotomy are again seen. There is no suspicious marrow
signal abnormality.

Sinuses/Orbits: Postsurgical changes are seen in the sinonasal
cavity. The globes and orbits are unremarkable.

Other: None.
IMPRESSION: 1. No acute intracranial pathology.
2. Unchanged encephalomalacia in the anterior frontal lobes, small
remote infarcts, and background chronic white matter microangiopathy
as above.

## 2021-08-06 MED ORDER — LORAZEPAM 2 MG/ML IJ SOLN
1.0000 mg | Freq: Once | INTRAMUSCULAR | Status: AC
  Administered 2021-08-06: 2 mg via INTRAVENOUS

## 2021-08-06 MED ORDER — OXYCODONE-ACETAMINOPHEN 5-325 MG PO TABS
1.0000 | ORAL_TABLET | Freq: Three times a day (TID) | ORAL | Status: DC | PRN
  Administered 2021-08-06 – 2021-08-07 (×3): 1 via ORAL
  Filled 2021-08-06 (×3): qty 1

## 2021-08-06 MED ORDER — TRAMADOL HCL 50 MG PO TABS: 50.0000 mg | ORAL_TABLET | Freq: Four times a day (QID) | ORAL | Status: DC | PRN

## 2021-08-06 MED ORDER — LORAZEPAM 2 MG/ML IJ SOLN
INTRAMUSCULAR | Status: AC
  Filled 2021-08-06: qty 1

## 2021-08-06 NOTE — Progress Notes (Signed)
? ? ? [facility] ?                                                                            ? ? ?Mark Thomas, is a 71 y.o. male, DOB - 1950/05/03, MWU:[id number] ?Admit date - 08/04/2021    ?Outpatient Primary MD for the patient is Luetta Nutting, DO ? ?LOS - 1  days ? ? ? ?Brief summary  ? ? ?Patient is a 71 yo M w/ pertinent PMH of HTN, CAD s/p stent, CVA, GERD, paroxysmal A-fib s/p Watchman device, CKD stage III, asthma presents to [facility] ED on 4/5 with possible fall and hypotension. ? ?Assessment & Plan  ? ? ?Assessment and Plan: ? ?Recurrent Pneumonia: Post covid  recurrent pneumonia:  ?S/p multiple rounds of antibiotics and steroids.  ? ?CTA chest showed no pulmonary embolism but had tree-in-bud and patchy groundglass opacity in bilateral upper lobe compatible with infection/inflammation. Possible post-covid complications. Stable mild peripheral interstitial opacities in the lower lung likely interstitial lung disease. ? ?-This has been recurrent and persistent since post-COVID in December. He has underwent multiple rounds of antibiotics and steroids with minimal improvement.   ?- requested PCCM consult for recommendations. Recommend outpatient follow up with PCCM for HRCT and further work up for ILD.  ?-continue with V vancomycin and cefepime, Will transition to oral antibiotics tomorrow. obtain sputum culture, Legionella and strep pneumo urine antigen. ?Pro calcitonin is negative.  ?- SLP evaluation.  ? ?Kidney cysts ?CT of the abdomen and pelvis without contrast showed LEFT renal lesion measuring 2.1 x 2.1 cm which is exophytic arising from the interpolar LEFT kidney. This could represent a hemorrhagic cyst or solid renal neoplasm. ?Ordered ultrasound of the kidneys. ? ? ?Fall at home, initial encounter ?Likely secondary to hypotension.  However wife mention brief episode of possible facial droop and dysarthria.   ?MRI of the brain is negative for an acute stroke ?Currently no focal deficits.   ? ? ?Hypotension ?Resolved in ED.  ? ?Acute renal failure superimposed on stage 3a chronic kidney disease ([place]) ?Creatinine of 1.51 from prior of 1.19  ?Much improved with IV fluids.  ?Continue to monitor.  ? ?Elevated  lactic acid:  ?Resolved.  ? ?Bruising over the right flank ?CT abd AND PELVIS ordered, no retroperitoneal bleed.  ? ? ?Estimated body mass index is 33.42 kg/m? as calculated from the following: ?  Height as of 07/28/21: _0  (1.854 m). ?  Weight as of this encounter: 114.9 kg. ? ?Code Status: FULL CODE ?DVT Prophylaxis:   ? ? ?Level of Care: Level of care: Telemetry Medical ?Family Communication: Updated patient's wife at bedside.  ? ?Disposition Plan:     Remains inpatient appropriate:  IV antibiotics.  ? ?Procedures:  ?None.  ? ?Consultants:   ?PCCM ? ?Antimicrobials:  ? ?Anti-infectives (From admission, onward)  ? ? Start     Dose/Rate Route Frequency Ordered Stop  ? 08/04/21 2100  ceFEPIme (MAXIPIME) 2 g in sodium chloride 0.9 % 100 mL IVPB       ? 2 g ?200 mL/hr over 30 Minutes Intravenous Every 8 hours 08/04/21 2029    ? 08/04/21 2030  vancomycin (VANCOREADY) IVPB 1500 mg/300  mL       ? 1,500 mg ?150 mL/hr over 120 Minutes Intravenous Every 24 hours 08/04/21 2029    ? 08/04/21 1845  cefTRIAXone (ROCEPHIN) 1 g in sodium chloride 0.9 % 100 mL IVPB  Status:  Discontinued       ? 1 g ?200 mL/hr over 30 Minutes Intravenous  Once 08/04/21 1839 08/04/21 1943  ? 08/04/21 1845  doxycycline (VIBRA-TABS) tablet 100 mg       ? 100 mg Oral  Once 08/04/21 1839 08/04/21 1917  ? ?  ? ? ? ?Medications ? ?Scheduled Meds: ? allopurinol  100 mg Oral Daily  ? aspirin EC  81 mg Oral Daily  ? atorvastatin  80 mg Oral Daily  ? cholecalciferol  5,000 Units Oral Daily  ? clopidogrel  75 mg Oral Daily  ? enoxaparin (LOVENOX) injection  60 mg Subcutaneous Q24H  ? escitalopram  30 mg Oral Daily  ? ferrous sulfate  325 mg Oral BID WC  ? latanoprost  1 drop Both Eyes QHS  ? LORazepam      ? magnesium oxide  400 mg Oral  Daily  ? memantine  10 mg Oral BID  ? metoprolol tartrate  12.5 mg Oral BID  ? mometasone-formoterol  2 puff Inhalation BID  ? pantoprazole  40 mg Oral QPM  ? primidone  100 mg Oral BID  ? QUEtiapine  100 mg Oral QHS  ? ranolazine  1,000 mg Oral BID  ? rOPINIRole  4 mg Oral QHS  ? ?Continuous Infusions: ? ceFEPime (MAXIPIME) IV 2 g (08/06/21 0622)  ? vancomycin 1,500 mg (08/05/21 2021)  ? ?PRN Meds:.acetaminophen, albuterol, chlorpheniramine-HYDROcodone, oxyCODONE-acetaminophen ? ? ? ?Subjective:  ? ?Mark Thomas was seen and examined today.  Feeling better, wants to go home.  ?Objective:  ? ?Vitals:  ? 08/06/21 0728 08/06/21 0858 08/06/21 1115 08/06/21 1238  ?BP: 126/80  124/82   ?Pulse: 78  78   ?Resp: 18  17   ?Temp: 97.9 ?F (36.6 ?C)  98.4 ?F (36.9 ?C)   ?TempSrc: Oral  Oral   ?SpO2: 96% 100% 96% 96%  ?Weight:      ? ? ?Intake/Output Summary (Last 24 hours) at 08/06/2021 1537 ?Last data filed at 08/06/2021 1200 ?Gross per 24 hour  ?Intake 2009.07 ml  ?Output 1705 ml  ?Net 304.07 ml  ? ? ?Filed Weights  ? 08/04/21 1503 08/06/21 0340  ?Weight: 113.4 kg 114.9 kg  ? ? ? ?Exam ?General exam: Appears calm and comfortable  ?Respiratory system: Clear to auscultation. Respiratory effort normal. ?Cardiovascular system: S1 & S2 heard, RRR. No JVD,  No pedal edema. ?Gastrointestinal system: Abdomen is nondistended, soft and nontender.  Normal bowel sounds heard. Bruising over the right flank.  ?Central nervous system: Alert and oriented. No focal neurological deficits. ?Extremities: Symmetric 5 x 5 power. ?Skin: No rashes, lesions or ulcers ?Psychiatry: Mood & affect appropriate.  ? ? ? ?Data Reviewed:  I have personally reviewed following labs and imaging studies ? ? ?CBC ?Lab Results  ?Component Value Date  ? WBC 7.9 08/06/2021  ? RBC 3.77 (L) 08/06/2021  ? HGB 12.8 (L) 08/06/2021  ? HCT 38.1 (L) 08/06/2021  ? MCV 101.1 (H) 08/06/2021  ? MCH 34.0 08/06/2021  ? PLT 139 (L) 08/06/2021  ? MCHC 33.6 08/06/2021  ? RDW 14.3  08/06/2021  ? LYMPHSABS 2.0 08/06/2021  ? MONOABS 0.8 08/06/2021  ? EOSABS 0.2 08/06/2021  ? BASOSABS 0.0 08/06/2021  ? ? ? ?Last  metabolic panel ?Lab Results  ?Component Value Date  ? NA 137 08/06/2021  ? K 5.1 08/06/2021  ? CL 105 08/06/2021  ? CO2 25 08/06/2021  ? BUN 19 08/06/2021  ? CREATININE 1.10 08/06/2021  ? GLUCOSE 102 (H) 08/06/2021  ? GFRNONAA >60 08/06/2021  ? CALCIUM 8.4 (L) 08/06/2021  ? PROT 6.3 (L) 08/04/2021  ? ALBUMIN 3.3 (L) 08/04/2021  ? BILITOT 0.9 08/04/2021  ? ALKPHOS 61 08/04/2021  ? AST 54 (H) 08/04/2021  ? ALT 64 (H) 08/04/2021  ? ANIONGAP 7 08/06/2021  ? ? ?CBG (last 3)  ?No results for input(s): GLUCAP in the last 72 hours.  ? ? ?Coagulation Profile: ?No results for input(s): INR, PROTIME in the last 168 hours. ? ? ?Radiology Studies: ?CT ABDOMEN PELVIS WO CONTRAST ? ?Result Date: 08/06/2021 ?CLINICAL DATA:  RIGHT lower quadrant abdominal pain, bruising and bleeding post fall. EXAM: CT ABDOMEN AND PELVIS WITHOUT CONTRAST TECHNIQUE: Multidetector CT imaging of the abdomen and pelvis was performed following the standard protocol without IV contrast. RADIATION DOSE REDUCTION: This exam was performed according to the departmental dose-optimization program which includes automated exposure control, adjustment of the mA and/or kV according to patient size and/or use of iterative reconstruction technique. COMPARISON:  CT of the chest acquired on August 04, 2021. FINDINGS: Lower chest: Basilar atelectasis and pleural and parenchymal scarring similar to the prior study. Marked cardiomegaly with signs of cardiac pacer device and watch min as well as coronary artery disease, incompletely assessed. No substantial pericardial effusion. Hepatobiliary: Liver with smooth contours. Post cholecystectomy. Common bile duct up to 12 mm maximal caliber. No gross intrahepatic biliary duct distension. No remote priors for comparison. Pancreas: Pancreatic atrophy no signs of gross ductal dilation or evidence of  inflammation. Spleen: Normal. Adrenals/Urinary Tract: 2.1 x 2.1 cm 30 Hounsfield unit LEFT renal lesion which is exophytic arising from the interpolar LEFT kidney. No LEFT-sided hydronephrosis or perinephric stranding.

## 2021-08-06 NOTE — TOC Progression Note (Signed)
Transition of Care (TOC) - Progression Note  ? ? ?Patient Details  ?Name: Mark Thomas ?MRN: [id number] ?Date of Birth: Sep 09, 1950 ? ?Transition of Care (TOC) CM/SW Contact  ?Zenon Mayo, RN ?Phone Number: ?08/06/2021, 8:00 AM ? ?Clinical Narrative:    ? ?Transition of Care (TOC) Screening Note ? ? ?Patient Details  ?Name: Mark Thomas ?Date of Birth: 03-18-1951 ? ? ?Transition of Care (TOC) CM/SW Contact:    ?Zenon Mayo, RN ?Phone Number: ?08/06/2021, 8:00 AM ? ? ? ?Transition of Care Department [facility]) has reviewed patient and no TOC needs have been identified at this time. We will continue to monitor patient advancement through interdisciplinary progression rounds. If new patient transition needs arise, please place a TOC consult. ?  ? ? ?  ?  ? ?Expected Discharge Plan and Services ?  ?  ?  ?  ?  ?                ?  ?  ?  ?  ?  ?  ?  ?  ?  ?  ? ? ?Social Determinants of Health (SDOH) Interventions ?  ? ?Readmission Risk Interventions ?   ? View : No data to display.  ?  ?  ?  ? ? ?

## 2021-08-06 NOTE — Progress Notes (Signed)
Informed of MRI for today.  ? ?Device system confirmed to be MRI conditional, with implant date > 6 weeks ago, and no evidence of abandoned or epicardial leads in review of most recent CXR ?Interrogation from today reviewed, pt is currently AP-VS at 75 bpm ?Change device settings for MRI to DOO at 90 bpm ? ?If pt doesn't tolerate V pacing, can switch to AOO. ? ?Tachy-therapies to off if applicable. ? ?Program device back to pre-MRI settings after completion of exam. ? ?Shirley Friar, PA-C  ?08/06/2021 2:02 PM   ?

## 2021-08-06 NOTE — Evaluation (Signed)
Physical Therapy Evaluation ?Patient Details ?Name: Mark Thomas ?MRN: [id number] ?DOB: [birth year] ?Today's Date: 08/06/2021 ? ?History of Present Illness ? The pt is a 71 yo male presenting 4/5 after a fall from home, found to be hypotensive upon arrival with SBP 70-80. Chest x-ray shows cardiomegaly with bilateral pleural effusion. CTA chest also showing PNA. PMH includes: anxiety, depression, vascular dementia, CAD, HTN, MI, and stroke. ?  ?Clinical Impression ? Pt in bed upon arrival of PT, agreeable to evaluation at this time. In last few months prior to admission, the pt's wife reports he was progressively more sedentary, spending most of each day in bed or recliner and getting up only as needed for self-care. The pt has also had worsening depression and dementia over this time, which further limited motivation for mobility. The pt reports frequent falls leading up to admission, with last one resulting in large bruise and back pain. The pt now presents with limitations in functional mobility, strength, power, activity tolerance, and stability due to above dx, and will continue to benefit from skilled PT to address these deficits. The session was limited to transition to sitting EOB as the pt's BP dropped (vitals below) and the pt felt too dizzy to continue. Left in chair position in bed with hOB at 60 deg with VVS. Will continue to benefit from skilled PT acutely to progress activity tolerance and OOB mobility to allow for safe return home. Pt is not currently safe from a mobility standpoint to return home, is at significant risk of falls and high risk for readmission.  ?   ? VITALS:  ?- supine in bed- BP: 124/81 (93); ?- sitting EOB - BP: 80/66 (72); HR: 77bpm *pt symptomatic* ?- sitting in bed HOB at 60 deg - 111/4 (91)  ? ?Recommendations for follow up therapy are one component of a multi-disciplinary discharge planning process, led by the attending physician.  Recommendations may be updated based on patient  status, additional functional criteria and insurance authorization. ? ?Follow Up Recommendations Home health PT ? ?  ?Assistance Recommended at Discharge Frequent or constant Supervision/Assistance  ?Patient can return home with the following ? A little help with walking and/or transfers;A little help with bathing/dressing/bathroom;Assistance with cooking/housework;Direct supervision/assist for medications management;Assist for transportation;Help with stairs or ramp for entrance ? ?  ?Equipment Recommendations None recommended by PT  ?Recommendations for Other Services ?    ?  ?Functional Status Assessment Patient has had a recent decline in their functional status and demonstrates the ability to make significant improvements in function in a reasonable and predictable amount of time.  ? ?  ?Precautions / Restrictions Precautions ?Precautions: Fall ?Precaution Comments: orthostatic, frequent falls at home ?Restrictions ?Weight Bearing Restrictions: No  ? ?  ? ?Mobility ? Bed Mobility ?Overal bed mobility: Needs Assistance ?Bed Mobility: Rolling, Sidelying to Sit, Sit to Supine ?Rolling: Min assist ?Sidelying to sit: Min assist ?  ?Sit to supine: Min assist ?  ?General bed mobility comments: minA with cues for log roll to complete bed mobility, HOB elevated initially. pt able to scoot up in bed without assist ?  ? ?Transfers ?  ?  ?  ?  ?  ?  ?  ?  ?  ?General transfer comment: declined due to dizziness and hypotension in sitting ?  ? ?Ambulation/Gait ?  ?  ?  ?  ?  ?  ?  ?General Gait Details: unsafe to attempt ? ? ?  ? ?Balance Overall balance assessment: History  of Falls (unable to attempt OOB mobility due to hypotension, suspect needs assist due to multiple recent falls) ?  ?  ?  ?  ?  ?  ?  ?  ?  ?  ?  ?  ?  ?  ?  ?  ?  ?  ?   ? ? ? ?Pertinent Vitals/Pain Pain Assessment ?Pain Assessment: Faces ?Faces Pain Scale: Hurts even more ?Pain Location: R side of low back, pt hit this area in the fall. ?Pain Descriptors  / Indicators: Discomfort, Sore ?Pain Intervention(s): Monitored during session, Repositioned, Limited activity within patient's tolerance  ? ? ?Home Living Family/patient expects to be discharged to:: Private residence ?Living Arrangements: Spouse/significant other ?Available Help at Discharge: Available 24 hours/day;Family ?Type of Home: House ?Home Access: Level entry ?  ?  ?  ?Home Layout: One level ?Home Equipment: Shower seat;Shower seat - built in;Hand held Museum/gallery curator (4 wheels) ?   ?  ?Prior Function Prior Level of Function : Needs assist;History of Falls (last six months) ?  ?  ?  ?Physical Assist : Mobility (physical);ADLs (physical) ?Mobility (physical): Gait ?ADLs (physical): Bathing;Dressing;IADLs ?Mobility Comments: pt mobilizing with use of rollator, reports 3-4 falls this week ?ADLs Comments: pt reports no assist other than getting into shower, pt wife states she assists some with dressing ?  ? ? ?Hand Dominance  ? Dominant Hand: Right ? ?  ?Extremity/Trunk Assessment  ? Upper Extremity Assessment ?Upper Extremity Assessment: Defer to OT evaluation (per wife, needs shoulder replacement) ?  ? ?Lower Extremity Assessment ?Lower Extremity Assessment: Generalized weakness ?  ? ?Cervical / Trunk Assessment ?Cervical / Trunk Assessment: Other exceptions ?Cervical / Trunk Exceptions: large body habitus and significant back pain  ?Communication  ? Communication: No difficulties  ?Cognition Arousal/Alertness: Awake/alert ?Behavior During Therapy: [facility] for tasks assessed/performed ?Overall Cognitive Status: History of cognitive impairments - at baseline ?  ?  ?  ?  ?  ?  ?  ?  ?  ?  ?  ?  ?  ?  ?  ?  ?General Comments: pt with vascular dementia at baseline, wife present and correcting inaccurate statements made by pt regarding PLOF. pt following all cues well ?  ?  ? ?  ?General Comments General comments (skin integrity, edema, etc.): BP: 124/81 (93) in supine, to 80/66 (72) sitting EOB with  pt feeling significantly dizzy and faint. ? ?  ?Exercises    ? ?Assessment/Plan  ?  ?PT Assessment Patient needs continued PT services  ?PT Problem List Decreased strength;Decreased range of motion;Decreased activity tolerance;Decreased balance;Decreased mobility;Decreased coordination ? ?   ?  ?PT Treatment Interventions DME instruction;Gait training;Stair training;Functional mobility training;Therapeutic activities;Therapeutic exercise;Balance training;Patient/family education   ? ?PT Goals (Current goals can be found in the Care Plan section)  ?Acute Rehab PT Goals ?Patient Stated Goal: improve dizziness ?PT Goal Formulation: With patient ?Time For Goal Achievement: 08/20/21 ?Potential to Achieve Goals: Good ? ?  ?Frequency Min 3X/week ?  ? ? ?   ?AM-PAC PT "6 Clicks" Mobility  ?Outcome Measure Help needed turning from your back to your side while in a flat bed without using bedrails?: A Little ?Help needed moving from lying on your back to sitting on the side of a flat bed without using bedrails?: A Little ?Help needed moving to and from a bed to a chair (including a wheelchair)?: A Lot ?Help needed standing up from a chair using your arms (e.g., wheelchair or bedside chair)?: A  Lot ?Help needed to walk in hospital room?: A Lot ?Help needed climbing 3-5 steps with a railing? : A Lot ?6 Click Score: 14 ? ?  ?End of Session   ?Activity Tolerance: Treatment limited secondary to medical complications (Comment) (orthostatic hypotension) ?Patient left: in bed;with call bell/phone within reach;with bed alarm set;with family/visitor present (in chair position, HOB at 60 deg) ?Nurse Communication: Mobility status ?PT Visit Diagnosis: Other abnormalities of gait and mobility (R26.89);Repeated falls (R29.6);Muscle weakness (generalized) (M62.81);History of falling (Z91.81) ?  ? ?Time: 2542-7062 ?PT Time Calculation (min) (ACUTE ONLY): 28 min ? ? ?Charges:   PT Evaluation ?$PT Eval Moderate Complexity: 1 Mod ?PT  Treatments ?$Therapeutic Activity: 8-22 mins ?  ?   ? ? ?[place] Carbo, PT, DPT  ? ?Acute Rehabilitation Department ?Pager #: [phone] ? ?Sandra Cockayne ?08/06/2021, 12:38 PM ? ?

## 2021-08-06 NOTE — Progress Notes (Signed)
Per orders,  Changed device settings for MRI to DOO at 90 bpm  ? ?If pt doesn't tolerate V pacing, can switch to AOO.  ? ?Tachy-therapies to off if applicable.  ? ?Will program device back to pre-MRI settings after completion of exam. ?

## 2021-08-06 NOTE — Evaluation (Signed)
Clinical/Bedside Swallow Evaluation ?Patient Details  ?Name: CHESNEY KLIMASZEWSKI ?MRN: [id number] ?Date of Birth: 05-Nov-1950 ? ?Today's Date: 08/06/2021 ?Time: SLP Start Time (ACUTE ONLY): 1623 SLP Stop Time (ACUTE ONLY): 5364 ?SLP Time Calculation (min) (ACUTE ONLY): 21 min ? ?Past Medical History:  ?Past Medical History:  ?Diagnosis Date  ? Anxiety   ? Coronary artery disease   ? Depression   ? Essential tremor   ? GERD (gastroesophageal reflux disease)   ? High cholesterol   ? Hypertension   ? Myocardial infarct [facility])   ? Orthostatic hypotension   ? Stroke [facility])   ? Vascular dementia ([place])   ? ?Past Surgical History:  ?Past Surgical History:  ?Procedure Laterality Date  ? APPENDECTOMY    ? CARDIAC SURGERY    ? CHOLECYSTECTOMY    ? HERNIA REPAIR    ? NASAL SINUS SURGERY    ? SHOULDER SURGERY    ? ?HPI:  ?The pt is a 71 yo male presenting 4/5 after a fall from home, found to be hypotensive upon arrival with SBP 70-80. Chest x-ray shows cardiomegaly with bilateral pleural effusion. CTA chest also showing PNA. PMH includes: anxiety, depression, vascular dementia, CAD, HTN, MI, and stroke.  MBS 03/11/20: "Mastication was appropriate with solids and mixed consistencies. The pt presents with penetration before the swallow with 1/3 trials of thin liquids that cleared with force of swallow. No aspiration occurred across trials." His wife reports occasional coughing with clear liquids 4-5 x/week.  ?  ?Assessment / Plan / Recommendation  ?Clinical Impression ? Pt presents with functional swallowing during clinical assessment and despite some confusion s/p sedation for MRI. WIfe at bedside. Demonstrated adequate mastication of solids, brisk swallow response, no s/s of aspiration with thiin liquids. Recommend continuing current diet of thin liquids, regular solids.  Given recent hx of occasional coughing with liquids, pt may benefit from MBS at some point in the near future. This could be completed as an OP if he will D/C soon.  D/W  Mrs. Markus Daft and RN. ? ?Please order OP MBS prior to D/C. ? ? ?SLP Visit Diagnosis: Dysphagia, unspecified (R13.10) ?   ?Aspiration Risk ? No limitations  ?  ?Diet Recommendation    ? ?Medication Administration: Whole meds with puree  ?  ?Other  Recommendations Oral Care Recommendations: Oral care BID   ? ?Recommendations for follow up therapy are one component of a multi-disciplinary discharge planning process, led by the attending physician.  Recommendations may be updated based on patient status, additional functional criteria and insurance authorization. ? ?Follow up Recommendations   OP MBS ? ? ?  ? ? ?Swallow Study   ?General HPI: The pt is a 71 yo male presenting 4/5 after a fall from home, found to be hypotensive upon arrival with SBP 70-80. Chest x-ray shows cardiomegaly with bilateral pleural effusion. CTA chest also showing PNA. PMH includes: anxiety, depression, vascular dementia, CAD, HTN, MI, and stroke.  MBS 03/11/20: "Mastication was appropriate with solids and mixed consistencies. The pt presents with penetration before the swallow with 1/3 trials of thin liquids that cleared with force of swallow. No aspiration occurred across trials." His wife reports occasional coughing with clear liquids 4-5 x/week. ?Type of Study: Bedside Swallow Evaluation ?Previous Swallow Assessment: see HPI ?Diet Prior to this Study: Regular;Thin liquids ?Temperature Spikes Noted: No ?Respiratory Status: Room air ?History of Recent Intubation: No ?Behavior/Cognition: Alert;Confused ?Oral Cavity Assessment: Within Functional Limits ?Oral Care Completed by SLP: No ?Oral Cavity - Dentition: Adequate  natural dentition ?Vision: Functional for self-feeding ?Self-Feeding Abilities: Able to feed self;Needs assist ?Patient Positioning: Upright in bed ?Baseline Vocal Quality: Normal ?Volitional Cough: Strong  ?  ?Oral/Motor/Sensory Function Overall Oral Motor/Sensory Function: Within functional limits   ?Ice Chips Ice chips: Within  functional limits   ?Thin Liquid Thin Liquid: Within functional limits  ?  ?Nectar Thick Nectar Thick Liquid: Not tested   ?Honey Thick Honey Thick Liquid: Not tested   ?Puree Puree: Within functional limits   ?Solid ? ? ?  Solid: Within functional limits  ? ?  ? ?Juan Quam Laurice ?08/06/2021,4:51 PM ? ?Ladasha Schnackenberg L. Karalyn Kadel, MA CCC/SLP ?Acute Rehabilitation Services ?Office number [phone] ?Pager [phone] ? ? ?

## 2021-08-06 NOTE — Plan of Care (Signed)
?  Problem: Clinical Measurements: ?Goal: Diagnostic test results will improve ?Outcome: Progressing ?Goal: Respiratory complications will improve ?Outcome: Progressing ?Goal: Cardiovascular complication will be avoided ?Outcome: Progressing ?  ?Problem: Coping: ?Goal: Level of anxiety will decrease ?Outcome: Progressing ?  ?Problem: Elimination: ?Goal: Will not experience complications related to urinary retention ?Outcome: Progressing ?  ?Problem: Pain Managment: ?Goal: General experience of comfort will improve ?Outcome: Progressing ?  ?Problem: Safety: ?Goal: Ability to remain free from injury will improve ?Outcome: Progressing ?  ?

## 2021-08-06 NOTE — Progress Notes (Signed)
OT Cancellation Note ? ?Patient Details ?Name: Mark Thomas ?MRN: [id number] ?DOB: [birth year] ? ? ?Cancelled Treatment:    Reason Eval/Treat Not Completed: Patient at procedure or test/ unavailable (MRI. Will return as schedule allows. Thank you.) ? ?Mark Thomas ?Meegan Shanafelt MSOT, OTR/L ?Acute Rehab ?Pager: [phone] ?Office: [phone] ?08/06/2021, 1:58 PM ?

## 2021-08-07 DIAGNOSIS — I959 Hypotension, unspecified: Secondary | ICD-10-CM | POA: Diagnosis not present

## 2021-08-07 DIAGNOSIS — R531 Weakness: Secondary | ICD-10-CM | POA: Diagnosis not present

## 2021-08-07 DIAGNOSIS — N179 Acute kidney failure, unspecified: Secondary | ICD-10-CM | POA: Diagnosis not present

## 2021-08-07 DIAGNOSIS — J189 Pneumonia, unspecified organism: Secondary | ICD-10-CM | POA: Diagnosis not present

## 2021-08-07 LAB — RHEUMATOID FACTOR: Rheumatoid fact SerPl-aCnc: <10 IU/mL (ref <14.0)

## 2021-08-07 LAB — SJOGRENS SYNDROME-A EXTRACTABLE NUCLEAR ANTIBODY: SSA (Ro) (ENA) Antibody, IgG: <0.2 AI (ref 0.0–0.9)

## 2021-08-07 LAB — SJOGRENS SYNDROME-B EXTRACTABLE NUCLEAR ANTIBODY: SSB (La) (ENA) Antibody, IgG: <0.2 AI (ref 0.0–0.9)

## 2021-08-07 LAB — ANA W/REFLEX IF POSITIVE: Anti Nuclear Antibody (ANA): NEGATIVE

## 2021-08-07 MED ORDER — SODIUM CHLORIDE 0.9 % IV SOLN
2.0000 g | Freq: Once | INTRAVENOUS | Status: AC
  Administered 2021-08-07: 2 g via INTRAVENOUS
  Filled 2021-08-07: qty 12.5

## 2021-08-07 MED ORDER — OXYCODONE-ACETAMINOPHEN 5-325 MG PO TABS: 1.0000 | ORAL_TABLET | Freq: Three times a day (TID) | ORAL | 0 refills | Status: DC | PRN

## 2021-08-07 MED ORDER — LEVOFLOXACIN 500 MG PO TABS: 500.0000 mg | ORAL_TABLET | Freq: Every day | ORAL | 0 refills | Status: DC

## 2021-08-07 MED ORDER — OXYCODONE-ACETAMINOPHEN 5-325 MG PO TABS
1.0000 | ORAL_TABLET | Freq: Once | ORAL | Status: AC
  Administered 2021-08-07: 1 via ORAL
  Filled 2021-08-07: qty 1

## 2021-08-07 NOTE — Plan of Care (Signed)
?  Problem: Clinical Measurements: ?Goal: Diagnostic test results will improve ?Outcome: Progressing ?Goal: Respiratory complications will improve ?Outcome: Progressing ?Goal: Cardiovascular complication will be avoided ?Outcome: Progressing ?  ?Problem: Coping: ?Goal: Level of anxiety will decrease ?Outcome: Progressing ?  ?Problem: Elimination: ?Goal: Will not experience complications related to urinary retention ?Outcome: Progressing ?  ?Problem: Pain Managment: ?Goal: General experience of comfort will improve ?Outcome: Progressing ?  ?Problem: Safety: ?Goal: Ability to remain free from injury will improve ?Outcome: Progressing ?  ?

## 2021-08-07 NOTE — Progress Notes (Signed)
Physical Therapy Treatment ?Patient Details ?Name: Mark Thomas ?MRN: [id number] ?DOB: 08-10-1950 ?Today's Date: 08/07/2021 ? ? ?History of Present Illness 71 yo male presenting 4/5 after a fall from home, found to be hypotensive upon arrival with SBP 70-80. Chest x-ray shows cardiomegaly with bilateral pleural effusion. CTA chest also showing PNA. MRI negative for stroke. PMH includes: anxiety, depression, vascular dementia, CAD, HTN, MI, and stroke. ? ?  ?PT Comments  ? ? Pt was seen for progressing to more gait today, with slow pace and noted hypotensive moments standing and mild improvement after gait.  BP values were as follow:  supine 133/72, sitting 113/91, standing 96/59, and post walk 99/70.  Pt is light headed to stand and required a sitting rest before trying sidesteps on side of bed.  Follow up with him to get him walking longer trips as tolerated, to get him up to chair and supported as tolerated and to monitor CV values that impact safety and recent falls.  HHPT is recommended but can reconsider this if pt is not safe.  Wife has concerns and will make effort to get the decision to hinge on his light headed feeling being medically managed.  BP values are trending higher today than the last PT session.  Follow acutely as goals are scheduled.  ?Recommendations for follow up therapy are one component of a multi-disciplinary discharge planning process, led by the attending physician.  Recommendations may be updated based on patient status, additional functional criteria and insurance authorization. ? ?Follow Up Recommendations ? Home health PT ?  ?  ?Assistance Recommended at Discharge Frequent or constant Supervision/Assistance  ?Patient can return home with the following A little help with walking and/or transfers;A little help with bathing/dressing/bathroom;Assistance with cooking/housework;Direct supervision/assist for medications management;Assist for transportation;Help with stairs or ramp for  entrance ?  ?Equipment Recommendations ? None recommended by PT  ?  ?Recommendations for Other Services   ? ? ?  ?Precautions / Restrictions Precautions ?Precautions: Fall ?Precaution Comments: orthostatic, frequent falls at home ?Restrictions ?Weight Bearing Restrictions: No  ?  ? ?Mobility ? Bed Mobility ?Overal bed mobility: Needs Assistance ?Bed Mobility: Supine to Sit, Sit to Supine ?Rolling: Min guard ?Sidelying to sit: Min guard ?Supine to sit: Min assist ?Sit to supine: Min guard ?  ?General bed mobility comments: min guard to get pt in safe body mechanics ?  ? ?Transfers ?Overall transfer level: Needs assistance ?Equipment used: 1 person hand held assist ?Transfers: Sit to/from Stand ?Sit to Stand: Min guard ?  ?  ?  ?  ?  ?  ?  ? ?Ambulation/Gait ?Ambulation/Gait assistance: Min assist ?Gait Distance (Feet): 7 Feet ?Assistive device: Rolling walker (2 wheels), 1 person hand held assist ?Gait Pattern/deviations: Step-to pattern, Decreased stride length, Narrow base of support, Wide base of support ?Gait velocity: reduced ?  ?Pre-gait activities: standing balance and BP ck ?General Gait Details: sidesteps with monitoring of BP ? ? ?Stairs ?  ?  ?  ?  ?  ? ? ?Wheelchair Mobility ?  ? ?Modified Rankin (Stroke Patients Only) ?  ? ? ?  ?Balance Overall balance assessment: History of Falls (orthostatic) ?  ?  ?  ?  ?  ?  ?  ?  ?  ?  ?  ?  ?  ?  ?  ?  ?  ?  ?  ? ?  ?Cognition Arousal/Alertness: Awake/alert ?Behavior During Therapy: [facility] for tasks assessed/performed ?Overall Cognitive Status: History of cognitive impairments -  at baseline ?  ?  ?  ?  ?  ?  ?  ?  ?  ?  ?  ?  ?  ?  ?  ?  ?General Comments: wife is present and talking to PT as well as pt trying to make history statements ?  ?  ? ?  ?Exercises   ? ?  ?General Comments General comments (skin integrity, edema, etc.): Pt received full test of BP with supine 133/72, sitting 113/91, standing 96/59, adn post walk 99/70 ?  ?  ? ?Pertinent Vitals/Pain Pain  Assessment ?Pain Assessment: Faces ?Faces Pain Scale: Hurts little more ?Pain Location: R low back bruise ?Pain Descriptors / Indicators: Discomfort, Guarding ?Pain Intervention(s): Limited activity within patient's tolerance, Premedicated before session, Repositioned  ? ? ?Home Living   ?  ?  ?  ?  ?  ?  ?  ?  ?  ?   ?  ?Prior Function    ?  ?  ?   ? ?PT Goals (current goals can now be found in the care plan section) Acute Rehab PT Goals ?Patient Stated Goal: feel steadier ?Progress towards PT goals: Progressing toward goals ? ?  ?Frequency ? ? ? Min 3X/week ? ? ? ?  ?PT Plan Current plan remains appropriate  ? ? ?Co-evaluation   ?  ?  ?  ?  ? ?  ?AM-PAC PT "6 Clicks" Mobility   ?Outcome Measure ? Help needed turning from your back to your side while in a flat bed without using bedrails?: A Little ?Help needed moving from lying on your back to sitting on the side of a flat bed without using bedrails?: A Little ?Help needed moving to and from a bed to a chair (including a wheelchair)?: A Little ?Help needed standing up from a chair using your arms (e.g., wheelchair or bedside chair)?: A Little ?Help needed to walk in hospital room?: A Lot ?Help needed climbing 3-5 steps with a railing? : Total ?6 Click Score: 15 ? ?  ?End of Session Equipment Utilized During Treatment: Gait belt ?Activity Tolerance: Patient limited by fatigue;Treatment limited secondary to medical complications (Comment) ?Patient left: in bed;with call bell/phone within reach;with bed alarm set ?Nurse Communication: Mobility status ?PT Visit Diagnosis: Other abnormalities of gait and mobility (R26.89);Unsteadiness on feet (R26.81);Muscle weakness (generalized) (M62.81);History of falling (Z91.81) ?  ? ? ?Time: 4076-8088 ?PT Time Calculation (min) (ACUTE ONLY): 24 min ? ?Charges:  $Gait Training: 8-22 mins ?$Therapeutic Activity: 8-22 mins     ?Ramond Dial ?08/07/2021, 6:20 PM ? ?Mee Hives, PT PhD ?Acute Rehab Dept. Number: [facility] [phone] and [place]  [phone] ? ? ?

## 2021-08-07 NOTE — TOC Transition Note (Signed)
Transition of Care (TOC) - CM/SW Discharge Note ? ? ?Patient Details  ?Name: Mark Thomas ?MRN: [id number] ?Date of Birth: March 12, 1951 ? ?Transition of Care (TOC) CM/SW Contact:  ?Carles Collet, RN ?Phone Number: ?08/07/2021, 11:57 AM ? ? ?Clinical Narrative:    ?Spoke w patient at bedside. ?Discussed Home Health Services. He is agreeable to services, no preference for agency. Referral [facility] by [facility] ? ? ? ?Final next level of care: [place] ?Barriers to Discharge: No Barriers Identified ? ? ?Patient Goals and CMS Choice ?Patient states their goals for this hospitalization and ongoing recovery are:: to go home ?CMS Medicare.gov Compare Post Acute Care list provided to:: Patient ?Choice offered to / list presented to : Patient ? ?Discharge Placement ?  ?           ?  ?  ?  ?  ? ?Discharge Plan and Services ?  ?  ?           ?  ?  ?  ?  ?  ?HH Arranged: PT, OT ?[place] Agency: [place] ?Date HH Agency Contacted: 08/07/21 ?Time [place]: [id number] ?Representative spoke with at [place]: Tommi Rumps ? ?Social Determinants of Health (SDOH) Interventions ?  ? ? ?Readmission Risk Interventions ?   ? View : No data to display.  ?  ?  ?  ? ? ? ? ? ?

## 2021-08-07 NOTE — Evaluation (Signed)
Occupational Therapy Evaluation ?Patient Details ?Name: Mark Thomas ?MRN: [id number] ?DOB: 1950/12/01 ?Today's Date: 08/07/2021 ? ? ?History of Present Illness The pt is a 71 yo male presenting 4/5 after a fall from home, found to be hypotensive upon arrival with SBP 70-80. Chest x-ray shows cardiomegaly with bilateral pleural effusion. CTA chest also showing PNA. MRI negative for stroke. PMH includes: anxiety, depression, vascular dementia, CAD, HTN, MI, and stroke.  ? ?Clinical Impression ?  ?Pt at PLOF was living with wife and needs assistance with transfers/mobilizing due to frequent falls. Pt in session was limited due to BP and reports of dizziness in session. Pt when sitting at EOB as declined from 91/88. Pt attempted to place bed with HOB elevated and into a chair position but also reported within less then 5 mins of dizziness and needed to be place back into supine. Pt also in session reported "feeling like someone is in the room then mentioned seeing what appeared like someone was bent over reaching for something in the room" besides this reporting therapist. Pt currently with functional limitations due to the deficits listed below (see OT Problem List).  Pt will benefit from skilled OT to increase their safety and independence with ADL and functional mobility for ADL to facilitate discharge to venue listed below.  ?  ?   ? ?Recommendations for follow up therapy are one component of a multi-disciplinary discharge planning process, led by the attending physician.  Recommendations may be updated based on patient status, additional functional criteria and insurance authorization.  ? ?Follow Up Recommendations ? Home health OT (if dizziness under control)  ?  ?Assistance Recommended at Discharge Frequent or constant Supervision/Assistance  ?Patient can return home with the following A little help with walking and/or transfers;A little help with bathing/dressing/bathroom;Assistance with cooking/housework;Direct  supervision/assist for medications management;Direct supervision/assist for financial management;Assist for transportation ? ?  ?Functional Status Assessment ? Patient has had a recent decline in their functional status and demonstrates the ability to make significant improvements in function in a reasonable and predictable amount of time.  ?Equipment Recommendations ?  (Pt may benifit from a W/C if still having decrease in ability to stand due to dizziness)  ?  ?Recommendations for Other Services   ? ? ?  ?Precautions / Restrictions Precautions ?Precautions: Fall ?Precaution Comments: orthostatic, frequent falls at home ?Restrictions ?Weight Bearing Restrictions: No  ? ?  ? ?Mobility Bed Mobility ?Overal bed mobility: Needs Assistance ?Bed Mobility: Supine to Sit, Sit to Supine ?Rolling: Supervision ?Sidelying to sit: Supervision ?Supine to sit: Min assist ?Sit to supine: Supervision ?  ?  ?  ? ?Transfers ?Overall transfer level: Needs assistance ?Equipment used: 1 person hand held assist ?Transfers: Sit to/from Stand ?Sit to Stand: Min guard ?  ?  ?  ?  ?  ?  ?  ? ?  ?Balance Overall balance assessment: History of Falls ?  ?  ?  ?  ?  ?  ?  ?  ?  ?  ?  ?  ?  ?  ?  ?  ?  ?  ?   ? ?ADL either performed or assessed with clinical judgement  ? ?ADL Overall ADL's : Needs assistance/impaired ?Eating/Feeding: Set up;Sitting ?  ?Grooming: Set up;Sitting ?  ?Upper Body Bathing: Sitting;Set up ?  ?Lower Body Bathing: Minimal assistance;Sit to/from stand ?  ?Upper Body Dressing : Set up;Sitting ?  ?Lower Body Dressing: Minimal assistance;Sit to/from stand ?  ?  ?  ?  ?  ?  ?  ?  ?  General ADL Comments: deffered any further ambulation at this time due to dizziness and BP readings  ? ? ? ?Vision   ?   ?   ?Perception   ?  ?Praxis   ?  ? ?Pertinent Vitals/Pain Pain Assessment ?Pain Assessment: 0-10 ?Pain Score: 1  ?Pain Location: R side of low back, pt hit this area in the fall. ?Pain Descriptors / Indicators: Discomfort,  Sore ?Pain Intervention(s): Monitored during session, Repositioned  ? ? ? ?Hand Dominance Right ?  ?Extremity/Trunk Assessment Upper Extremity Assessment ?Upper Extremity Assessment: Generalized weakness ?  ?Lower Extremity Assessment ?Lower Extremity Assessment: Defer to PT evaluation ?  ?Cervical / Trunk Assessment ?Cervical / Trunk Exceptions: large body habitus and significant back pain ?  ?Communication Communication ?Communication: No difficulties ?  ?Cognition Arousal/Alertness: Awake/alert ?Behavior During Therapy: [facility] for tasks assessed/performed ?Overall Cognitive Status: History of cognitive impairments - at baseline ?  ?  ?  ?  ?  ?  ?  ?  ?  ?  ?  ?  ?  ?  ?  ?  ?  ?  ?  ?General Comments    ? ?  ?Exercises   ?  ?Shoulder Instructions    ? ? ?Home Living Family/patient expects to be discharged to:: Private residence ?Living Arrangements: Spouse/significant other ?Available Help at Discharge: Available 24 hours/day;Family ?Type of Home: House ?Home Access: Level entry ?  ?  ?Home Layout: One level ?  ?  ?Bathroom Shower/Tub: Walk-in shower ?  ?Bathroom Toilet: Handicapped height ?Bathroom Accessibility: Yes ?  ?Home Equipment: Shower seat;Shower seat - built in;Hand held Museum/gallery curator (4 wheels) ?  ?  ?  ? ?  ?Prior Functioning/Environment Prior Level of Function : Needs assist;History of Falls (last six months) ?  ?  ?  ?Physical Assist : Mobility (physical);ADLs (physical) ?Mobility (physical): Gait ?ADLs (physical): Bathing;Dressing;IADLs ?Mobility Comments: pt mobilizing with use of rollator, reports 3-4 falls this week ?ADLs Comments: pt reports no assist other than getting into shower, pt wife states she assists some with dressing ?  ? ?  ?  ?OT Problem List: Decreased strength;Decreased activity tolerance;Impaired balance (sitting and/or standing);Decreased safety awareness;Decreased knowledge of use of DME or AE;Pain ?  ?   ?OT Treatment/Interventions: Self-care/ADL  training;Therapeutic exercise;DME and/or AE instruction;Therapeutic activities;Patient/family education;Balance training  ?  ?OT Goals(Current goals can be found in the care plan section) Acute Rehab OT Goals ?Patient Stated Goal: to get moving more ?OT Goal Formulation: With patient ?Time For Goal Achievement: 08/21/21 ?Potential to Achieve Goals: Good ?ADL Goals ?Pt Will Perform Grooming: Independently;standing ?Pt Will Perform Lower Body Bathing: with modified independence;sit to/from stand ?Pt Will Perform Lower Body Dressing: with modified independence;sit to/from stand ?Pt Will Transfer to Toilet: with modified independence;ambulating;bedside commode;grab bars  ?OT Frequency: Min 2X/week ?  ? ?Co-evaluation   ?  ?  ?  ?  ? ?  ?AM-PAC OT "6 Clicks" Daily Activity     ?Outcome Measure Help from another person eating meals?: A Little ?Help from another person taking care of personal grooming?: A Little ?Help from another person toileting, which includes using toliet, bedpan, or urinal?: A Little ?Help from another person bathing (including washing, rinsing, drying)?: A Little ?Help from another person to put on and taking off regular upper body clothing?: A Little ?Help from another person to put on and taking off regular lower body clothing?: A Little ?6 Click Score: 18 ?  ?End of Session Nurse Communication: Other (  comment) (per pt feeling like someone elses is in the rom then mentioned seeing what appeared to looks liks someone bent over and BP) ? ?Activity Tolerance: Other (comment) (limited by dizziness) ?Patient left: in bed;with call bell/phone within reach;with bed alarm set ? ?OT Visit Diagnosis: Unsteadiness on feet (R26.81);Other abnormalities of gait and mobility (R26.89);Repeated falls (R29.6);Muscle weakness (generalized) (M62.81);Pain ?Pain - part of body:  (back)  ?              ?Time: 4650-3546 ?OT Time Calculation (min): 30 min ?Charges:  OT General Charges ?$OT Visit: 1 Visit ?OT  Evaluation ?$OT Eval Low Complexity: 1 Low ?OT Treatments ?$Self Care/Home Management : 8-22 mins ? ?Joeseph Amor OTR/L  ?Acute Rehab Services  ?[phone] office number ?[phone] pager number ? ? ?Joeseph Amor ?08/07/2021, 10:27

## 2021-08-07 NOTE — Progress Notes (Signed)
Pharmacy Antibiotic Note ? ?Mark Thomas is a 71 y.o. male admitted on 08/04/2021 with pneumonia.  Pharmacy has been consulted for vanc/cefepime dosing. ? ?Pt presented after a fall with elevated lactate. CT showed likely infection. Empiric vanc and cefepime started. Patient has improved and is stable to transition to PO antibiotic to complete course. ? ?Scr 1.4>>Crcl 83.9 ml/min ?Wbc 7.9 ? ?Plan: ?Vanc 1.5g IV q24>>AUC 512, scr 1.4 ?Cefepime 2g IV q8 ?Plan to transition to oral antibiotics today per MD ? ?Weight: 110.8 kg (244 lb 4.8 oz) ? ?Temp (24hrs), Avg:98.5 ?F (36.9 ?C), Min:98.4 ?F (36.9 ?C), Max:98.5 ?F (36.9 ?C) ? ?Recent Labs  ?Lab 08/04/21 ?1452 08/04/21 ?1453 08/04/21 ?1535 08/04/21 ?1635 08/04/21 ?2049 08/05/21 ?4268 08/05/21 ?0703 08/06/21 ?0308  ?WBC 13.1*  --   --   --   --   --  8.1 7.9  ?CREATININE 1.51*  --  1.40*  --   --   --  1.32* 1.10  ?LATICACIDVEN  --  2.2*  --  2.2* 1.8 1.4  --   --   ? ?  ?Estimated Creatinine Clearance: 83.9 mL/min (by C-G formula based on SCr of 1.1 mg/dL).   ? ?Allergies  ?Allergen Reactions  ? Dilaudid [Hydromorphone] Other (See Comments)  ?  Respiratory Arrest  ? Bactrim [Sulfamethoxazole-Trimethoprim] Rash  ? Benadryl [Diphenhydramine] Other (See Comments)  ?  Muscle spasm   ? Phenobarbital   ? ? ?Antimicrobials this admission: ?4/5 vanc>> ?4/5 cefepime>> ? ?Dose adjustments this admission: ? ? ?Microbiology results: ?Resp panel neg ?4/5 blood>> NGTD ? ?Thank you for allowing pharmacy to participate in this patient's care. ? ?Reatha Harps, PharmD ?PGY1 Pharmacy Resident ?08/07/2021 8:41 AM ?Check [email or web address] for unit specific pharmacy number ? ? ? ? ?

## 2021-08-07 NOTE — Progress Notes (Signed)
? ? ? [facility] ?                                                                            ? ? ?Mark Thomas, is a 71 y.o. male, DOB - April 29, 1951, VCB:[id number] ?Admit date - 08/04/2021    ?Outpatient Primary MD for the patient is Mark Nutting, DO ? ?LOS - 2  days ? ? ? ?Brief summary  ? ? ?Patient is a 71 yo M w/ pertinent PMH of HTN, CAD s/p stent, CVA, GERD, paroxysmal A-fib s/p Watchman device, CKD stage III, asthma presents to [facility] ED on 4/5 with possible fall and hypotension. ? ?Assessment & Plan  ? ? ?Assessment and Plan: ? ?Recurrent Pneumonia: Post covid  recurrent pneumonia:  ?S/p multiple rounds of antibiotics and steroids.  ? ?CTA chest showed no pulmonary embolism but had tree-in-bud and patchy groundglass opacity in bilateral upper lobe compatible with infection/inflammation. Possible post-covid complications. Stable mild peripheral interstitial opacities in the lower lung likely interstitial lung disease. ? ?-This has been recurrent and persistent since post-COVID in December. He has underwent multiple rounds of antibiotics and steroids with minimal improvement.   ?- requested PCCM consult for recommendations. Recommend outpatient follow up with PCCM for HRCT and further work up for ILD.  ?- started on IV vancomycin and IV cefepime, last dose of cefepime tonight and transition to oral levaquin tomorrow.  ?Pro calcitonin is negative.  ?- SLP evaluation recommending MBS on discharge.  ? ?Kidney cysts ?CT of the abdomen and pelvis without contrast showed LEFT renal lesion measuring 2.1 x 2.1 cm which is exophytic arising from the interpolar LEFT kidney. This could represent a hemorrhagic cyst or solid renal neoplasm. ?US renal showed simple cysts.  ?Recommend outpatient follow up with Urology as outpatient.  ? ? ?Fall at home, initial encounter ?Likely secondary to hypotension.  However wife mention brief episode of possible facial droop and dysarthria.   ?MRI of the brain is negative for  an acute stroke ?Currently no focal deficits.  ? ? ?Hypotension ?Resolved in ED.  ? ?Acute renal failure superimposed on stage 3a chronic kidney disease ([place]) ?Creatinine of 1.51 from prior of 1.19  ?Much improved with IV fluids.  ?Continue to monitor.  ? ?Elevated  lactic acid:  ?Resolved.  ? ?Bruising over the right flank ?CT abd AND PELVIS ordered, no retroperitoneal bleed.  ? ? ?Orthostatic hypotension:  ?Pt s BP parameters dropped  on standing and pt symptomatic with dizziness. Will hold the oxycodone and metoprolol.  ?Monitor.  ? ? ?Estimated body mass index is 32.23 kg/m? as calculated from the following: ?  Height as of 07/28/21: _0  (1.854 m). ?  Weight as of this encounter: 110.8 kg. ? ?Code Status: FULL CODE ?DVT Prophylaxis:   ? ? ?Level of Care: Level of care: Telemetry Medical ?Family Communication: Updated patient's wife at bedside.  ? ?Disposition Plan:     Remains inpatient appropriate:  IV antibiotics and orthostatic hypotension.  ? ?Procedures:  ?None.  ? ?Consultants:   ?PCCM ? ?Antimicrobials:  ? ?Anti-infectives (From admission, onward)  ? ? Start     Dose/Rate Route Frequency Ordered Stop  ? 08/07/21 0000  levofloxacin (  LEVAQUIN) 500 MG tablet       ? 500 mg Oral Daily 08/07/21 1102    ? 08/04/21 2100  ceFEPIme (MAXIPIME) 2 g in sodium chloride 0.9 % 100 mL IVPB       ? 2 g ?200 mL/hr over 30 Minutes Intravenous Every 8 hours 08/04/21 2029    ? 08/04/21 2030  vancomycin (VANCOREADY) IVPB 1500 mg/300 mL  Status:  Discontinued       ? 1,500 mg ?150 mL/hr over 120 Minutes Intravenous Every 24 hours 08/04/21 2029 08/07/21 1309  ? 08/04/21 1845  cefTRIAXone (ROCEPHIN) 1 g in sodium chloride 0.9 % 100 mL IVPB  Status:  Discontinued       ? 1 g ?200 mL/hr over 30 Minutes Intravenous  Once 08/04/21 1839 08/04/21 1943  ? 08/04/21 1845  doxycycline (VIBRA-TABS) tablet 100 mg       ? 100 mg Oral  Once 08/04/21 1839 08/04/21 1917  ? ?  ? ? ? ?Medications ? ?Scheduled Meds: ? allopurinol  100 mg Oral  Daily  ? aspirin EC  81 mg Oral Daily  ? atorvastatin  80 mg Oral Daily  ? cholecalciferol  5,000 Units Oral Daily  ? clopidogrel  75 mg Oral Daily  ? enoxaparin (LOVENOX) injection  60 mg Subcutaneous Q24H  ? escitalopram  30 mg Oral Daily  ? ferrous sulfate  325 mg Oral BID WC  ? latanoprost  1 drop Both Eyes QHS  ? magnesium oxide  400 mg Oral Daily  ? memantine  10 mg Oral BID  ? metoprolol tartrate  12.5 mg Oral BID  ? mometasone-formoterol  2 puff Inhalation BID  ? pantoprazole  40 mg Oral QPM  ? primidone  100 mg Oral BID  ? QUEtiapine  100 mg Oral QHS  ? ranolazine  1,000 mg Oral BID  ? rOPINIRole  4 mg Oral QHS  ? ?Continuous Infusions: ? ceFEPime (MAXIPIME) IV 2 g (08/07/21 1300)  ? ?PRN Meds:.acetaminophen, albuterol, chlorpheniramine-HYDROcodone ? ? ? ?Subjective:  ? ?Mark Thomas was seen and examined today.  Pt reports dizziness, whIle working with OT.  ?Objective:  ? ?Vitals:  ? 08/07/21 0350 08/07/21 0424 08/07/21 0740 08/07/21 1236  ?BP: 117/61   133/72  ?Pulse: 78     ?Resp: 14   16  ?Temp: 98.5 ?F (36.9 ?C)     ?TempSrc: Oral     ?SpO2: 97%  97%   ?Weight:  110.8 kg    ? ? ?Intake/Output Summary (Last 24 hours) at 08/07/2021 1455 ?Last data filed at 08/07/2021 0549 ?Gross per 24 hour  ?Intake 600.1 ml  ?Output 1100 ml  ?Net -499.9 ml  ? ? ?Filed Weights  ? 08/04/21 1503 08/06/21 0340 08/07/21 0424  ?Weight: 113.4 kg 114.9 kg 110.8 kg  ? ? ? ?Exam ?General exam: Appears calm and comfortable  ?Respiratory system: Clear to auscultation. Respiratory effort normal. ?Cardiovascular system: S1 & S2 heard, RRR. No JVD, No pedal edema. ?Gastrointestinal system: Abdomen is nondistended, soft and nontender. Normal bowel sounds heard. ?Central nervous system: Alert and oriented. No focal neurological deficits. ?Extremities: Symmetric 5 x 5 power. ?Skin: No rashes, lesions or ulcers ?Psychiatry:  Mood & affect appropriate.  ? ? ? ?Data Reviewed:  I have personally reviewed following labs and imaging  studies ? ? ?CBC ?Lab Results  ?Component Value Date  ? WBC 7.9 08/06/2021  ? RBC 3.77 (L) 08/06/2021  ? HGB 12.8 (L) 08/06/2021  ? HCT 38.1 (L)  08/06/2021  ? MCV 101.1 (H) 08/06/2021  ? MCH 34.0 08/06/2021  ? PLT 139 (L) 08/06/2021  ? MCHC 33.6 08/06/2021  ? RDW 14.3 08/06/2021  ? LYMPHSABS 2.0 08/06/2021  ? MONOABS 0.8 08/06/2021  ? EOSABS 0.2 08/06/2021  ? BASOSABS 0.0 08/06/2021  ? ? ? ?Last metabolic panel ?Lab Results  ?Component Value Date  ? NA 137 08/06/2021  ? K 5.1 08/06/2021  ? CL 105 08/06/2021  ? CO2 25 08/06/2021  ? BUN 19 08/06/2021  ? CREATININE 1.10 08/06/2021  ? GLUCOSE 102 (H) 08/06/2021  ? GFRNONAA >60 08/06/2021  ? CALCIUM 8.4 (L) 08/06/2021  ? PROT 6.3 (L) 08/04/2021  ? ALBUMIN 3.3 (L) 08/04/2021  ? BILITOT 0.9 08/04/2021  ? ALKPHOS 61 08/04/2021  ? AST 54 (H) 08/04/2021  ? ALT 64 (H) 08/04/2021  ? ANIONGAP 7 08/06/2021  ? ? ?CBG (last 3)  ?No results for input(s): GLUCAP in the last 72 hours.  ? ? ?Coagulation Profile: ?No results for input(s): INR, PROTIME in the last 168 hours. ? ? ?Radiology Studies: ?CT ABDOMEN PELVIS WO CONTRAST ? ?Result Date: 08/06/2021 ?CLINICAL DATA:  RIGHT lower quadrant abdominal pain, bruising and bleeding post fall. EXAM: CT ABDOMEN AND PELVIS WITHOUT CONTRAST TECHNIQUE: Multidetector CT imaging of the abdomen and pelvis was performed following the standard protocol without IV contrast. RADIATION DOSE REDUCTION: This exam was performed according to the departmental dose-optimization program which includes automated exposure control, adjustment of the mA and/or kV according to patient size and/or use of iterative reconstruction technique. COMPARISON:  CT of the chest acquired on August 04, 2021. FINDINGS: Lower chest: Basilar atelectasis and pleural and parenchymal scarring similar to the prior study. Marked cardiomegaly with signs of cardiac pacer device and watch min as well as coronary artery disease, incompletely assessed. No substantial pericardial effusion.  Hepatobiliary: Liver with smooth contours. Post cholecystectomy. Common bile duct up to 12 mm maximal caliber. No gross intrahepatic biliary duct distension. No remote priors for comparison. Pancreas: Pancreatic atrophy no

## 2021-08-08 DIAGNOSIS — R531 Weakness: Secondary | ICD-10-CM | POA: Diagnosis not present

## 2021-08-08 DIAGNOSIS — I959 Hypotension, unspecified: Secondary | ICD-10-CM | POA: Diagnosis not present

## 2021-08-08 DIAGNOSIS — J189 Pneumonia, unspecified organism: Secondary | ICD-10-CM | POA: Diagnosis not present

## 2021-08-08 DIAGNOSIS — N179 Acute kidney failure, unspecified: Secondary | ICD-10-CM | POA: Diagnosis not present

## 2021-08-08 LAB — BASIC METABOLIC PANEL
Anion gap: 7 (ref 5–15)
BUN: 15 mg/dL (ref 8–23)
CO2: 23 mmol/L (ref 22–32)
Calcium: 9.1 mg/dL (ref 8.9–10.3)
Chloride: 106 mmol/L (ref 98–111)
Creatinine, Ser: 1.09 mg/dL (ref 0.61–1.24)
GFR, Estimated: >60 mL/min (ref >60)
Glucose, Bld: 95 mg/dL (ref 70–99)
Potassium: 4.6 mmol/L (ref 3.5–5.1)
Sodium: 136 mmol/L (ref 135–145)

## 2021-08-08 LAB — CYCLIC CITRUL PEPTIDE ANTIBODY, IGG/IGA: CCP Antibodies IgG/IgA: <1 units (ref 0–19)

## 2021-08-08 MED ORDER — LACTATED RINGERS IV BOLUS
250.0000 mL | Freq: Once | INTRAVENOUS | Status: AC
  Administered 2021-08-08: 250 mL via INTRAVENOUS

## 2021-08-08 MED ORDER — MIDODRINE HCL 2.5 MG PO TABS
2.5000 mg | ORAL_TABLET | Freq: Three times a day (TID) | ORAL | 0 refills | Status: DC
Start: 2021-08-08 — End: 2021-08-16

## 2021-08-08 MED ORDER — IBUPROFEN 200 MG PO TABS
400.0000 mg | ORAL_TABLET | Freq: Three times a day (TID) | ORAL | Status: DC | PRN
Start: 2021-08-08 — End: 2021-08-08

## 2021-08-08 MED ORDER — MIDODRINE HCL 5 MG PO TABS: 2.5000 mg | ORAL_TABLET | Freq: Three times a day (TID) | ORAL | Status: DC

## 2021-08-08 MED ORDER — GUAIFENESIN-DM 100-10 MG/5ML PO SYRP
10.0000 mL | ORAL_SOLUTION | ORAL | Status: DC | PRN
  Administered 2021-08-09: 10 mL via ORAL
  Filled 2021-08-08: qty 10

## 2021-08-08 MED ORDER — MIDODRINE HCL 5 MG PO TABS
2.5000 mg | ORAL_TABLET | Freq: Three times a day (TID) | ORAL | Status: DC
  Administered 2021-08-08 – 2021-08-09 (×4): 2.5 mg via ORAL
  Filled 2021-08-08 (×4): qty 1

## 2021-08-08 MED ORDER — HYDROXYZINE HCL 25 MG PO TABS
25.0000 mg | ORAL_TABLET | Freq: Once | ORAL | Status: AC
  Administered 2021-08-08: 25 mg via ORAL
  Filled 2021-08-08: qty 1

## 2021-08-08 MED ORDER — LEVOFLOXACIN 500 MG PO TABS
500.0000 mg | ORAL_TABLET | Freq: Every day | ORAL | Status: DC
  Administered 2021-08-08 – 2021-08-09 (×2): 500 mg via ORAL
  Filled 2021-08-08 (×2): qty 1

## 2021-08-08 MED ORDER — HYDROCOD POLI-CHLORPHE POLI ER 10-8 MG/5ML PO SUER: 5.0000 mL | Freq: Two times a day (BID) | ORAL | 0 refills | Status: DC | PRN

## 2021-08-08 NOTE — Progress Notes (Addendum)
HOSPITAL MEDICINE OVERNIGHT EVENT NOTE   ? ?Notified by nursing that patient has been exhibiting significant anxiousness throughout the evening with bouts of tearfulness.  Patient voices significant anxiety concerning his hospitalization. ? ?According to nursing patient is awake alert, oriented and appropriate.  We will give patient a trial dose of hydroxyzine for symptoms. ? ?Vernelle Emerald  MD ?[facility]  ? ?ADDENDUM (4/10 1am) ? ?Hydroxyzine ineffective.  Will give 0.63m Ativan.  ? ?GSherryll BurgerShalhoub ? ? ? ? ? ? ? ? ? ? ? ? ? ?

## 2021-08-08 NOTE — Progress Notes (Signed)
? ? ? [facility] ?                                                                            ? ? ?Mark Thomas, is a 71 y.o. male, DOB - 16-Apr-1951, VQM:[id number] ?Admit date - 08/04/2021    ?Outpatient Primary MD for the patient is Luetta Nutting, DO ? ?LOS - 3  days ? ? ? ?Brief summary  ? ? ?Patient is a 72 yo M w/ pertinent PMH of HTN, CAD s/p stent, CVA, GERD, paroxysmal A-fib s/p Watchman device, CKD stage III, asthma presents to [facility] ED on 4/5 with possible fall and hypotension. ? ?Assessment & Plan  ? ? ?Assessment and Plan: ? ?Recurrent Pneumonia: Post covid  recurrent pneumonia:  ?S/p multiple rounds of antibiotics and steroids.  ? ?CTA chest showed no pulmonary embolism but had tree-in-bud and patchy groundglass opacity in bilateral upper lobe compatible with infection/inflammation. Possible post-covid complications. Stable mild peripheral interstitial opacities in the lower lung likely interstitial lung disease. ? ?-This has been recurrent and persistent since post-COVID in December. He has underwent multiple rounds of antibiotics and steroids with minimal improvement.   ?- requested PCCM consult for recommendations. Recommend outpatient follow up with PCCM for HRCT and further work up for ILD.  ?- started on IV vancomycin and IV cefepime, transitioned to oral levaquin for 3 more days to complete the course.  ?Pro calcitonin is negative.  ?- SLP evaluation recommending MBS on discharge.  ? ?Kidney cysts ?CT of the abdomen and pelvis without contrast showed LEFT renal lesion measuring 2.1 x 2.1 cm which is exophytic arising from the interpolar LEFT kidney. This could represent a hemorrhagic cyst or solid renal neoplasm. ?US renal showed simple cysts.  ?Recommend outpatient follow up with Urology as outpatient.  ? ? ?Fall at home, initial encounter ?Likely secondary to hypotension.  However wife mention brief episode of possible facial droop and dysarthria.   ?MRI of the brain is negative for an  acute stroke ?Currently no focal deficits.  ? ? ?Orthostatic Hypotension ?Pt positive for orthostatics,  ?Stop all bp meds at this time. Small fluid bolus. Thigh high ted hoses ordered.  ?Started the patient on midodrine.  ? ? ?Acute renal failure superimposed on stage 3a chronic kidney disease ([place]) ?Creatinine of 1.51 from prior of 1.19  ?Much improved with IV fluids.  ?Continue to monitor.  ? ?Elevated  lactic acid:  ?Resolved.  ? ?Bruising over the right flank ?CT abd AND PELVIS ordered, no retroperitoneal bleed.  ? ? ? ? ?Estimated body mass index is 33.38 kg/m? as calculated from the following: ?  Height as of 07/28/21: _0  (1.854 m). ?  Weight as of this encounter: 114.8 kg. ? ?Code Status: FULL CODE ?DVT Prophylaxis:  Place TED hose Start: 08/08/21 1134 ? ? ?Level of Care: Level of care: Telemetry Medical ?Family Communication: Updated patient's wife over the phone.  ? ?Disposition Plan:     Remains inpatient appropriate:   orthostatic hypotension.  ? ?Procedures:  ?None.  ? ?Consultants:   ?PCCM ? ?Antimicrobials:  ? ?Anti-infectives (From admission, onward)  ? ? Start     Dose/Rate Route Frequency Ordered Stop  ?  08/08/21 1400  levofloxacin (LEVAQUIN) tablet 500 mg       ? 500 mg Oral Daily 08/08/21 1302 08/11/21 0959  ? 08/07/21 2100  ceFEPIme (MAXIPIME) 2 g in sodium chloride 0.9 % 100 mL IVPB       ? 2 g ?200 mL/hr over 30 Minutes Intravenous  Once 08/07/21 1457 08/07/21 2154  ? 08/07/21 0000  levofloxacin (LEVAQUIN) 500 MG tablet       ? 500 mg Oral Daily 08/07/21 1102    ? 08/04/21 2100  ceFEPIme (MAXIPIME) 2 g in sodium chloride 0.9 % 100 mL IVPB  Status:  Discontinued       ? 2 g ?200 mL/hr over 30 Minutes Intravenous Every 8 hours 08/04/21 2029 08/07/21 1457  ? 08/04/21 2030  vancomycin (VANCOREADY) IVPB 1500 mg/300 mL  Status:  Discontinued       ? 1,500 mg ?150 mL/hr over 120 Minutes Intravenous Every 24 hours 08/04/21 2029 08/07/21 1309  ? 08/04/21 1845  cefTRIAXone (ROCEPHIN) 1 g in sodium  chloride 0.9 % 100 mL IVPB  Status:  Discontinued       ? 1 g ?200 mL/hr over 30 Minutes Intravenous  Once 08/04/21 1839 08/04/21 1943  ? 08/04/21 1845  doxycycline (VIBRA-TABS) tablet 100 mg       ? 100 mg Oral  Once 08/04/21 1839 08/04/21 1917  ? ?  ? ? ? ?Medications ? ?Scheduled Meds: ? allopurinol  100 mg Oral Daily  ? aspirin EC  81 mg Oral Daily  ? atorvastatin  80 mg Oral Daily  ? cholecalciferol  5,000 Units Oral Daily  ? clopidogrel  75 mg Oral Daily  ? enoxaparin (LOVENOX) injection  60 mg Subcutaneous Q24H  ? escitalopram  30 mg Oral Daily  ? ferrous sulfate  325 mg Oral BID WC  ? latanoprost  1 drop Both Eyes QHS  ? levofloxacin  500 mg Oral Daily  ? magnesium oxide  400 mg Oral Daily  ? memantine  10 mg Oral BID  ? midodrine  2.5 mg Oral TID WC  ? mometasone-formoterol  2 puff Inhalation BID  ? pantoprazole  40 mg Oral QPM  ? primidone  100 mg Oral BID  ? QUEtiapine  100 mg Oral QHS  ? ranolazine  1,000 mg Oral BID  ? rOPINIRole  4 mg Oral QHS  ? ?Continuous Infusions: ? ? ?PRN Meds:.acetaminophen, albuterol, guaiFENesin-dextromethorphan ? ? ? ?Subjective:  ? ?Mark Thomas was seen and examined today.  Orthostatic, but better than yesterdya. No dizziness.  ?Objective:  ? ?Vitals:  ? 08/08/21 0046 08/08/21 0310 08/08/21 0711 08/08/21 1108  ?BP:  105/66  (!) 121/94  ?Pulse:    78  ?Resp:  17  16  ?Temp:  (!) 97.5 ?F (36.4 ?C)  98.3 ?F (36.8 ?C)  ?TempSrc:  Oral  Oral  ?SpO2:  98% 97%   ?Weight: 114.8 kg     ? ? ?Intake/Output Summary (Last 24 hours) at 08/08/2021 1321 ?Last data filed at 08/08/2021 0959 ?Gross per 24 hour  ?Intake 900 ml  ?Output 2525 ml  ?Net -1625 ml  ? ? ?Filed Weights  ? 08/06/21 0340 08/07/21 0424 08/08/21 0046  ?Weight: 114.9 kg 110.8 kg 114.8 kg  ? ? ? ?Exam ?General exam: Appears calm and comfortable  ?Respiratory system: Clear to auscultation. Respiratory effort normal. ?Cardiovascular system: S1 & S2 heard, RRR. No JVD,  No pedal edema. ?Gastrointestinal system: Abdomen is  nondistended, soft and nontender. Normal bowel  sounds heard. ?Central nervous system: Alert and oriented. No focal neurological deficits. ?Extremities: Symmetric 5 x 5 power. ?Skin: No rashes, lesions or ulcers ?Psychiatry:  Mood & affect appropriate.  ? ? ? ? ? ?Data Reviewed:  I have personally reviewed following labs and imaging studies ? ? ?CBC ?Lab Results  ?Component Value Date  ? WBC 7.9 08/06/2021  ? RBC 3.77 (L) 08/06/2021  ? HGB 12.8 (L) 08/06/2021  ? HCT 38.1 (L) 08/06/2021  ? MCV 101.1 (H) 08/06/2021  ? MCH 34.0 08/06/2021  ? PLT 139 (L) 08/06/2021  ? MCHC 33.6 08/06/2021  ? RDW 14.3 08/06/2021  ? LYMPHSABS 2.0 08/06/2021  ? MONOABS 0.8 08/06/2021  ? EOSABS 0.2 08/06/2021  ? BASOSABS 0.0 08/06/2021  ? ? ? ?Last metabolic panel ?Lab Results  ?Component Value Date  ? NA 136 08/08/2021  ? K 4.6 08/08/2021  ? CL 106 08/08/2021  ? CO2 23 08/08/2021  ? BUN 15 08/08/2021  ? CREATININE 1.09 08/08/2021  ? GLUCOSE 95 08/08/2021  ? GFRNONAA >60 08/08/2021  ? CALCIUM 9.1 08/08/2021  ? PROT 6.3 (L) 08/04/2021  ? ALBUMIN 3.3 (L) 08/04/2021  ? BILITOT 0.9 08/04/2021  ? ALKPHOS 61 08/04/2021  ? AST 54 (H) 08/04/2021  ? ALT 64 (H) 08/04/2021  ? ANIONGAP 7 08/08/2021  ? ? ?CBG (last 3)  ?No results for input(s): GLUCAP in the last 72 hours.  ? ? ?Coagulation Profile: ?No results for input(s): INR, PROTIME in the last 168 hours. ? ? ?Radiology Studies: ?CT ABDOMEN PELVIS WO CONTRAST ? ?Result Date: 08/06/2021 ?CLINICAL DATA:  RIGHT lower quadrant abdominal pain, bruising and bleeding post fall. EXAM: CT ABDOMEN AND PELVIS WITHOUT CONTRAST TECHNIQUE: Multidetector CT imaging of the abdomen and pelvis was performed following the standard protocol without IV contrast. RADIATION DOSE REDUCTION: This exam was performed according to the departmental dose-optimization program which includes automated exposure control, adjustment of the mA and/or kV according to patient size and/or use of iterative reconstruction technique.  COMPARISON:  CT of the chest acquired on August 04, 2021. FINDINGS: Lower chest: Basilar atelectasis and pleural and parenchymal scarring similar to the prior study. Marked cardiomegaly with signs of cardiac pacer device

## 2021-08-09 ENCOUNTER — Telehealth: Payer: Self-pay

## 2021-08-09 DIAGNOSIS — N281 Cyst of kidney, acquired: Secondary | ICD-10-CM | POA: Diagnosis not present

## 2021-08-09 DIAGNOSIS — N179 Acute kidney failure, unspecified: Secondary | ICD-10-CM | POA: Diagnosis not present

## 2021-08-09 DIAGNOSIS — I959 Hypotension, unspecified: Secondary | ICD-10-CM | POA: Diagnosis not present

## 2021-08-09 DIAGNOSIS — J189 Pneumonia, unspecified organism: Secondary | ICD-10-CM | POA: Diagnosis not present

## 2021-08-09 LAB — CULTURE, BLOOD (ROUTINE X 2)
Culture: NO GROWTH
Culture: NO GROWTH
Special Requests: ADEQUATE

## 2021-08-09 LAB — LEGIONELLA PNEUMOPHILA SEROGP 1 UR AG: L. pneumophila Serogp 1 Ur Ag: NEGATIVE

## 2021-08-09 LAB — ANCA PROFILE
Anti-MPO Antibodies: <0.2 units (ref 0.0–0.9)
Anti-PR3 Antibodies: <0.2 units (ref 0.0–0.9)
Atypical P-ANCA titer: <1:20 {titer}
C-ANCA: <1:20 {titer}
P-ANCA: <1:20 {titer}

## 2021-08-09 LAB — CULTURE, RESPIRATORY W GRAM STAIN
Culture: NORMAL
Gram Stain: NONE SEEN

## 2021-08-09 LAB — GLUCOSE, CAPILLARY: Glucose-Capillary: 167 mg/dL — ABNORMAL HIGH (ref 70–99)

## 2021-08-09 MED ORDER — LORAZEPAM 0.5 MG PO TABS
0.5000 mg | ORAL_TABLET | Freq: Once | ORAL | Status: AC
  Administered 2021-08-09: 0.5 mg via ORAL
  Filled 2021-08-09: qty 1

## 2021-08-09 MED ORDER — GUAIFENESIN-DM 100-10 MG/5ML PO SYRP: 10.0000 mL | ORAL_SOLUTION | ORAL | 0 refills | Status: DC | PRN

## 2021-08-09 NOTE — Care Management Important Message (Signed)
Important Message ? ?Patient Details  ?Name: Mark Thomas ?MRN: [id number] ?Date of Birth: 1950-11-15 ? ? ?Medicare Important Message Given:  Yes ? ? ? ? ?[name] ?08/09/2021, 8:44 AM ?

## 2021-08-09 NOTE — Progress Notes (Signed)
Occupational Therapy Treatment ?Patient Details ?Name: Mark Thomas ?MRN: [id number] ?DOB: 11-22-1950 ?Today's Date: 08/09/2021 ? ? ?History of present illness 71 yo male presenting 4/5 after a fall from home, found to be hypotensive upon arrival with SBP 70-80. Chest x-ray shows cardiomegaly with bilateral pleural effusion. CTA chest also showing PNA. MRI negative for stroke. PMH includes: anxiety, depression, vascular dementia, CAD, HTN, MI, and stroke. ?  ?OT comments ? Patient continues to make steady progress towards goals in skilled OT session. Patient's session encompassed further assessment of blood pressure when completing functional mobility and recommendations for equipment in order for safe discharge home. Patient with min guard for bed mobility, and remained asymptomatic with blood pressure readings despite drop, but would fatigue with standing for less than 2 minutes. Given levels, OT recommending discharge home at w/c level for utmost safety, with patient and spouse in agreement (patient already has w/c at home). Patients wife asking about bed rail that can be added to home bed, therefore recommendations given with emphasis on safety if increased dependence is needed on bed rail to power up into standing. Continue to recommend [place] as blood pressure was able to remain stable and wife is able to provide needed level of support.  ? ?Supine 172/84 (103) ?Sitting EOB 134/78 (88) ?Sitting EOB 2 minutes:103/67 (76) ?Sitting EOB 4 minutes and completing seated exercises 107/70 (82) ? Standing: 82/52 (63) ?Standing after seated rest break: 79/55 (64) ?Sitting EOB 2 minutes:105/75 (85) ?Standing after seated rest break:87/60 (69) ?Standing after seated rest break:88/54 (66) ?Sitting EOB 2-4 minutes: 104/88 (95)  ? ?Recommendations for follow up therapy are one component of a multi-disciplinary discharge planning process, led by the attending physician.  Recommendations may be updated based on patient status,  additional functional criteria and insurance authorization. ?   ?Follow Up Recommendations ? Home health OT  ?  ?Assistance Recommended at Discharge Frequent or constant Supervision/Assistance  ?Patient can return home with the following ? A little help with walking and/or transfers;A little help with bathing/dressing/bathroom;Assistance with cooking/housework;Direct supervision/assist for medications management;Direct supervision/assist for financial management;Assist for transportation ?  ?Equipment Recommendations ? None recommended by OT  ?  ?Recommendations for Other Services   ? ?  ?Precautions / Restrictions Precautions ?Precautions: Fall ?Precaution Comments: orthostatic, frequent falls at home ?Restrictions ?Weight Bearing Restrictions: No  ? ? ?  ? ?Mobility Bed Mobility ?Overal bed mobility: Needs Assistance ?Bed Mobility: Supine to Sit, Sit to Supine ?  ?  ?Supine to sit: Min guard ?Sit to supine: Min guard ?  ?General bed mobility comments: good use of bed rail to come into sitting, minimal increased time ?  ? ?Transfers ?Overall transfer level: Needs assistance ?Equipment used: Rolling walker (2 wheels) ?Transfers: Sit to/from Stand ?Sit to Stand: Min assist, Min guard ?  ?  ?  ?  ?  ?General transfer comment: patient able to complete multiple sit<>stands, with cues for hand placement, good power up noted (see BP levels for assessment) ?  ?  ?Balance   ?  ?  ?  ?  ?  ?  ?  ?  ?  ?  ?  ?  ?  ?  ?  ?  ?  ?  ?   ? ?ADL either performed or assessed with clinical judgement  ? ?ADL Overall ADL's : Needs assistance/impaired ?  ?  ?  ?  ?  ?  ?  ?  ?  ?  ?  ?  ?  Toilet Transfer: Rolling walker (2 wheels);BSC/3in1;Min guard;Minimal assistance ?Toilet Transfer Details (indicate cue type and reason): simulated with multiple sit<>stands ?  ?  ?  ?  ?Functional mobility during ADLs: Rolling walker (2 wheels);Minimal assistance;Min guard ?General ADL Comments: session focus on education for equipment for safe  discharge home as well as assessment of BP readings when attempting further mobility ?  ? ?Extremity/Trunk Assessment   ?  ?  ?  ?  ?  ? ?Vision   ?  ?  ?Perception   ?  ?Praxis   ?  ? ?Cognition Arousal/Alertness: Awake/alert ?Behavior During Therapy: [facility] for tasks assessed/performed ?Overall Cognitive Status: History of cognitive impairments - at baseline ?  ?  ?  ?  ?  ?  ?  ?  ?  ?  ?  ?  ?  ?  ?  ?  ?General Comments: patient alert and appropriate throughout session ?  ?  ?   ?Exercises General Exercises - Lower Extremity ?Ankle Circles/Pumps: AROM, Both, 10 reps ?Hip Flexion/Marching: AROM, Both, 10 reps ? ?  ?Shoulder Instructions   ? ? ?  ?General Comments    ? ? ?Pertinent Vitals/ Pain       Pain Assessment ?Pain Assessment: Faces ?Faces Pain Scale: Hurts little more ?Pain Location: R low back bruise ?Pain Descriptors / Indicators: Discomfort, Guarding ?Pain Intervention(s): Limited activity within patient's tolerance, Monitored during session, Repositioned ? ?Home Living   ?  ?  ?  ?  ?  ?  ?  ?  ?  ?  ?  ?  ?  ?  ?  ?  ?  ?  ? ?  ?Prior Functioning/Environment    ?  ?  ?  ?   ? ?Frequency ? Min 2X/week  ? ? ? ? ?  ?Progress Toward Goals ? ?OT Goals(current goals can now be found in the care plan section) ? Progress towards OT goals: Progressing toward goals ? ?Acute Rehab OT Goals ?Patient Stated Goal: to get back home ?OT Goal Formulation: With patient ?Time For Goal Achievement: 08/21/21 ?Potential to Achieve Goals: Good  ?Plan Discharge plan remains appropriate   ? ?Co-evaluation ? ? ?   ?  ?  ?  ?  ? ?  ?AM-PAC OT "6 Clicks" Daily Activity     ?Outcome Measure ? ? Help from another person eating meals?: None ?Help from another person taking care of personal grooming?: A Little ?Help from another person toileting, which includes using toliet, bedpan, or urinal?: A Little ?Help from another person bathing (including washing, rinsing, drying)?: A Little ?Help from another person to put on and taking off  regular upper body clothing?: A Little ?Help from another person to put on and taking off regular lower body clothing?: A Little ?6 Click Score: 19 ? ?  ?End of Session Equipment Utilized During Treatment: Rolling walker (2 wheels);Gait belt ? ?OT Visit Diagnosis: Unsteadiness on feet (R26.81);Other abnormalities of gait and mobility (R26.89);Repeated falls (R29.6);Muscle weakness (generalized) (M62.81);Pain ?  ?Activity Tolerance Patient tolerated treatment well ?  ?Patient Left in bed;with call bell/phone within reach;with bed alarm set (in chair position) ?  ?Nurse Communication Mobility status ?  ? ?   ? ?Time: 4696-2952 ?OT Time Calculation (min): 18 min ? ?Charges: OT General Charges ?$OT Visit: 1 Visit ?OT Treatments ?$Self Care/Home Management : 8-22 mins ? ?Corinne Ports E. Shirlette Scarber, OTR/L ?Acute Rehabilitation Services ?[phone]  ? ?Corinne Ports Irvin Bastin ?08/09/2021, 1:06  PM ?

## 2021-08-09 NOTE — Telephone Encounter (Signed)
Follow-up on patient's nebulizer with [facility]. [phone]) ? ?They stated the patient has already picked up his nebulizer and no additional information was required from PCP.  ?

## 2021-08-09 NOTE — Progress Notes (Signed)
Physical Therapy Treatment ?Patient Details ?Name: Mark Thomas ?MRN: [id number] ?DOB: 02-25-1951 ?Today's Date: 08/09/2021 ? ? ?History of Present Illness 71 yo male presenting 4/5 after a fall from home, found to be hypotensive upon arrival with SBP 70-80. Chest x-ray shows cardiomegaly with bilateral pleural effusion. CTA chest also showing PNA. MRI negative for stroke. PMH includes: anxiety, depression, vascular dementia, CAD, HTN, MI, and stroke. ? ?  ?PT Comments  ? ? The pt was agreeable to session, eager to mobilize and hopeful for return home. The pt was able to complete bed mobility with minG for safety , but continues to be limited by significant drop in BP through the session with each change in position. The pt remained asymptomatic other than reporting fatigue with continued exercise or standing. The pt and his wife are in agreement with plan to d/c home mostly on the [facility] level to reduce risk of frequent falls while the pt builds up endurance and strength. Will need max HHPT to facilitate improvements and increase tolerance for OOB mobility.  ? ?VITALS:  ?- supine in bed- BP: 172/84 (103);  ?- sitting EOB - BP: 134/78 (88); ?- sitting EOB 2 minutes - BP: 103/67 (76) ?- sitting EOB 4 minutes and completing seated exercises - BP: 107/70 (82) ?- standing - BP: 82/52 (63);  ?- sitting EOB - BP: 105/75 (85) ?- standing after ambulation - BP: 88/54 (66); ?- sitting EOB - 104/88 (95) ? ?  ?Recommendations for follow up therapy are one component of a multi-disciplinary discharge planning process, led by the attending physician.  Recommendations may be updated based on patient status, additional functional criteria and insurance authorization. ? ?Follow Up Recommendations ? Home health PT ?  ?  ?Assistance Recommended at Discharge Frequent or constant Supervision/Assistance  ?Patient can return home with the following A little help with walking and/or transfers;A little help with  bathing/dressing/bathroom;Assistance with cooking/housework;Direct supervision/assist for medications management;Assist for transportation;Help with stairs or ramp for entrance ?  ?Equipment Recommendations ? None recommended by PT  ?  ?Recommendations for Other Services   ? ? ?  ?Precautions / Restrictions Precautions ?Precautions: Fall ?Precaution Comments: orthostatic, frequent falls at home ?Restrictions ?Weight Bearing Restrictions: No  ?  ? ?Mobility ? Bed Mobility ?Overal bed mobility: Needs Assistance ?Bed Mobility: Supine to Sit, Sit to Supine ?  ?  ?Supine to sit: Min guard ?Sit to supine: Min guard ?  ?General bed mobility comments: good use of bed rail to come into sitting, minimal increased time ?  ? ?Transfers ?Overall transfer level: Needs assistance ?Equipment used: Rolling walker (2 wheels) ?Transfers: Sit to/from Stand ?Sit to Stand: Min assist, Min guard ?  ?  ?  ?  ?  ?General transfer comment: patient able to complete multiple sit<>stands, with cues for hand placement, good power up noted (see BP levels for assessment) ?  ? ?Ambulation/Gait ?Ambulation/Gait assistance: Min assist ?Gait Distance (Feet): 3 Feet (forwards and back x3) ?Assistive device: Rolling walker (2 wheels) ?Gait Pattern/deviations: Step-through pattern, Decreased stride length, Shuffle, Trunk flexed ?Gait velocity: reduced ?  ?  ?General Gait Details: forwards and back from EOB, small steps with dependence on BUE support on RW. only able to tolerate ~ 10 ft total ? ? ?Stairs ?  ?  ?  ?  ?  ? ? ?Wheelchair Mobility ?  ? ?Modified Rankin (Stroke Patients Only) ?  ? ? ?  ?Balance Overall balance assessment: History of Falls ?  ?  ?  ?  ?  ?  ?  ?  ?  ?  ?  ?  ?  ?  ?  ?  ?  ?  ?  ? ?  ?  Cognition Arousal/Alertness: Awake/alert ?Behavior During Therapy: [facility] for tasks assessed/performed ?Overall Cognitive Status: History of cognitive impairments - at baseline ?  ?  ?  ?  ?  ?  ?  ?  ?  ?  ?  ?  ?  ?  ?  ?  ?General Comments:  patient alert and appropriate throughout session ?  ?  ? ?  ?Exercises General Exercises - Lower Extremity ?Long Arc Quad: AROM, Both, 10 reps, Seated ?Hip Flexion/Marching: AROM, Both, 10 reps, Seated ? ?  ?General Comments   ?  ?  ? ?Pertinent Vitals/Pain Pain Assessment ?Faces Pain Scale: Hurts little more ?Pain Location: R low back bruise ?Pain Descriptors / Indicators: Discomfort, Guarding  ? ? ?Home Living   ?  ?  ?  ?  ?  ?  ?  ?  ?  ?   ?  ?Prior Function    ?  ?  ?   ? ?PT Goals (current goals can now be found in the care plan section) Acute Rehab PT Goals ?Patient Stated Goal: feel steadier ?PT Goal Formulation: With patient ?Time For Goal Achievement: 08/20/21 ?Potential to Achieve Goals: Good ?Progress towards PT goals: Progressing toward goals ? ?  ?Frequency ? ? ? Min 3X/week ? ? ? ?  ?PT Plan Current plan remains appropriate  ? ? ?Co-evaluation   ?  ?  ?  ?  ? ?  ?AM-PAC PT "6 Clicks" Mobility   ?Outcome Measure ? Help needed turning from your back to your side while in a flat bed without using bedrails?: None ?Help needed moving from lying on your back to sitting on the side of a flat bed without using bedrails?: None ?Help needed moving to and from a bed to a chair (including a wheelchair)?: A Little ?Help needed standing up from a chair using your arms (e.g., wheelchair or bedside chair)?: A Little ?Help needed to walk in hospital room?: A Lot ?Help needed climbing 3-5 steps with a railing? : Total ?6 Click Score: 17 ? ?  ?End of Session Equipment Utilized During Treatment: Gait belt ?Activity Tolerance: Patient limited by fatigue;Treatment limited secondary to medical complications (Comment) ?Patient left: in bed;with call bell/phone within reach;with bed alarm set ?Nurse Communication: Mobility status ?PT Visit Diagnosis: Other abnormalities of gait and mobility (R26.89);Unsteadiness on feet (R26.81);Muscle weakness (generalized) (M62.81);History of falling (Z91.81) ?  ? ? ?Time: 2194-7125 ?PT  Time Calculation (min) (ACUTE ONLY): 19 min ? ?Charges:  $Therapeutic Exercise: 8-22 mins          ?          ? ?West Carbo, PT, DPT  ? ?Acute Rehabilitation Department ?Pager #: [phone] ? ? ?Sandra Cockayne ?08/09/2021, 1:42 PM ? ?

## 2021-08-10 ENCOUNTER — Ambulatory Visit (HOSPITAL_COMMUNITY): Payer: TRICARE For Life (TFL) | Admitting: Psychiatry

## 2021-08-10 NOTE — Discharge Summary (Signed)
?Physician Discharge Summary ?  ?Patient: Mark Thomas MRN: [id number] DOB: Sep 14, 1950  ?Admit date:     08/04/2021  ?Discharge date: 08/09/2021  ?Discharge Physician: Hosie Poisson  ? ?PCP: Luetta Nutting, DO  ? ?Recommendations at discharge:  ?Please follow up with PCP in one week.  ?Please follow up with Pulmonology as scheduled.  ?Please check CBC and BMP in one week.  ?Please follow up with cardiology for orthostatic hypotension.  ?We have stopped all your BP meds due to orthostatic hypotension.  ? ?Discharge Diagnoses: ?Principal Problem: ?  Community acquired pneumonia ?Active Problems: ?  Acute renal failure superimposed on stage 3a chronic kidney disease ([place]) ?  Hypotension ?  Fall at home, initial encounter ?  Kidney cysts ?  Pneumonia ? ?Hospital Course: ? ?Patient is a 71 yo M w/ pertinent PMH of HTN, CAD s/p stent, CVA, GERD, paroxysmal A-fib s/p Watchman device, CKD stage III, asthma presents to [facility] ED on 4/5 with possible fall and hypotension. ? ? ?Assessment and Plan: ?*Recurrent Pneumonia: Post covid  recurrent pneumonia:  ?S/p multiple rounds of antibiotics and steroids.  ?  ?CTA chest showed no pulmonary embolism but had tree-in-bud and patchy groundglass opacity in bilateral upper lobe compatible with infection/inflammation. Possible post-covid complications. Stable mild peripheral interstitial opacities in the lower lung likely interstitial lung disease. ?  ?-This has been recurrent and persistent since post-COVID in December. He has underwent multiple rounds of antibiotics and steroids with minimal improvement.   ?- requested PCCM consult for recommendations. Recommend outpatient follow up with PCCM for HRCT and further work up for ILD.  ?- started on IV vancomycin and IV cefepime, transitioned to oral levaquin  to complete the course.  ?Pro calcitonin is negative.  ? ?  ?Kidney cysts ?CT of the abdomen and pelvis without contrast showed LEFT renal lesion measuring 2.1 x 2.1 cm which is  exophytic arising from the interpolar LEFT kidney. This could represent a hemorrhagic cyst or solid renal neoplasm. ?US renal showed simple cysts.  ?Recommend outpatient follow up with Urology as outpatient.  ?  ?  ?Fall at home, initial encounter ?Likely secondary to hypotension.  However wife mention brief episode of possible facial droop and dysarthria.   ?MRI of the brain is negative for an acute stroke ?Currently no focal deficits.  ?  ?  ?Orthostatic Hypotension ?Pt positive for orthostatics,  ?Holding BP meds at this time due to orthostatic hypotension, which would explain the multiple falls.  ?Midodrine ordered , thigh high ted hoses ordered.  ?Recommend follow up with cardiology in one week.  ?  ?  ?Acute renal failure superimposed on stage 3a chronic kidney disease ([place]) ?Creatinine of 1.51 from prior of 1.19  ?Much improved with IV fluids.  ?Continue to monitor.  ?  ?Elevated  lactic acid:  ?Resolved.  ?  ?Bruising over the right flank ?CT abd AND PELVIS ordered, no retroperitoneal bleed.  ?  ?  ?  ?  ?Estimated body mass index is 33.38 kg/m? as calculated from the following: ?  Height as of 07/28/21: _0  (1.854 m). ?  Weight as of this encounter: 114.8 kg. ?  ? ?Consultants: none  ?Procedures performed: none.   ?Disposition: Home ?Diet recommendation:  ?Discharge Diet Orders (From admission, onward)  ? ?  Start     Ordered  ? 08/09/21 0000  Diet - low sodium heart healthy       ? 08/09/21 1338  ? 08/07/21 0000  Diet -  low sodium heart healthy       ? 08/07/21 1102  ? ?  ?  ? ?  ? ?Cardiac diet ?DISCHARGE MEDICATION: ?Allergies as of 08/09/2021   ? ?   Reactions  ? Dilaudid [hydromorphone] Other (See Comments)  ? Respiratory Arrest  ? Bactrim [sulfamethoxazole-trimethoprim] Rash  ? Benadryl [diphenhydramine] Other (See Comments)  ? Muscle spasm   ? Phenobarbital   ? ?  ? ?  ?Medication List  ?  ? ?STOP taking these medications   ? ?acetaminophen 500 MG tablet ?Commonly known as: TYLENOL ?  ?AMBULATORY NON  FORMULARY MEDICATION ?  ?azithromycin 250 MG tablet ?Commonly known as: Zithromax Z-Pak ?  ?chlorpheniramine-HYDROcodone 10-8 MG/5ML ?  ?dexamethasone 4 MG tablet ?Commonly known as: DECADRON ?  ?fluticasone 50 MCG/ACT nasal spray ?Commonly known as: FLONASE ?  ?furosemide 20 MG tablet ?Commonly known as: LASIX ?  ?losartan 25 MG tablet ?Commonly known as: COZAAR ?  ?metoprolol tartrate 25 MG tablet ?Commonly known as: LOPRESSOR ?  ?Oxcarbazepine 300 MG tablet ?Commonly known as: TRILEPTAL ?  ?predniSONE 10 MG tablet ?Commonly known as: DELTASONE ?  ?TUSSIONEX PENNKINETIC ER PO ?  ? ?  ? ?TAKE these medications   ? ?albuterol 108 (90 Base) MCG/ACT inhaler ?Commonly known as: VENTOLIN HFA ?Inhale 2 puffs into the lungs every 6 (six) hours as needed for wheezing or shortness of breath. ?  ?albuterol (2.5 MG/3ML) 0.083% nebulizer solution ?Commonly known as: PROVENTIL ?Take 3 mLs (2.5 mg total) by nebulization every 6 (six) hours as needed for wheezing or shortness of breath. ?  ?allopurinol 100 MG tablet ?Commonly known as: ZYLOPRIM ?Take 100 mg by mouth daily. ?  ?aspirin 81 MG EC tablet ?Take 1 tablet by mouth daily. ?  ?atorvastatin 80 MG tablet ?Commonly known as: LIPITOR ?Take 80 mg by mouth daily. ?  ?b complex vitamins capsule ?Take 1 capsule by mouth daily. ?  ?budesonide-formoterol 160-4.5 MCG/ACT inhaler ?Commonly known as: SYMBICORT ?Inhale 2 puffs into the lungs 2 (two) times daily. ?What changed: how much to take ?  ?clopidogrel 75 MG tablet ?Commonly known as: PLAVIX ?Take 75 mg by mouth daily. ?  ?escitalopram 20 MG tablet ?Commonly known as: LEXAPRO ?Take 1.5 tablets (30 mg total) by mouth daily. ?  ?ferrous sulfate 325 (65 FE) MG EC tablet ?Take 1 tablet (325 mg total) by mouth in the morning and at bedtime. ?  ?guaiFENesin-dextromethorphan 100-10 MG/5ML syrup ?Commonly known as: ROBITUSSIN DM ?Take 10 mLs by mouth every 4 (four) hours as needed for cough. ?  ?latanoprost 0.005 % ophthalmic  solution ?Commonly known as: XALATAN ?Place 1 drop into both eyes at bedtime. ?  ?levofloxacin 500 MG tablet ?Commonly known as: Levaquin ?Take 1 tablet (500 mg total) by mouth daily. ?  ?magnesium oxide 400 MG tablet ?Commonly known as: MAG-OX ?Take 400 mg by mouth daily. ?  ?memantine 10 MG tablet ?Commonly known as: Namenda ?Take 1 tablet (10 mg total) by mouth 2 (two) times daily. ?  ?midodrine 2.5 MG tablet ?Commonly known as: PROAMATINE ?Take 1 tablet (2.5 mg total) by mouth 3 (three) times daily with meals. ?  ?nitroGLYCERIN 0.4 MG/SPRAY spray ?Commonly known as: NITROLINGUAL ?Place 1 spray under the tongue every 5 (five) minutes x 3 doses as needed for chest pain. ?  ?pantoprazole 40 MG tablet ?Commonly known as: PROTONIX ?Take 1 tablet (40 mg total) by mouth every evening. ?  ?potassium citrate 10 MEQ (1080 MG) SR tablet ?Commonly known  as: UROCIT-K ?Take 10 mEq by mouth daily. ?  ?primidone 50 MG tablet ?Commonly known as: MYSOLINE ?Take 2 tablets (100 mg total) by mouth 2 (two) times daily. ?  ?QUEtiapine 50 MG tablet ?Commonly known as: SEROQUEL ?Take 2 tablets (100 mg total) by mouth at bedtime. ?  ?ranolazine 1000 MG SR tablet ?Commonly known as: RANEXA ?Take 1,000 mg by mouth 2 (two) times daily. ?  ?rOPINIRole 4 MG 24 hr tablet ?Commonly known as: REQUIP XL ?Take 1 tablet (4 mg total) by mouth at bedtime. ?  ?triamcinolone ointment 0.1 % ?Commonly known as: KENALOG ?Apply 1 application topically 2 (two) times daily. ?  ?Vitamin D-3 125 MCG (5000 UT) Tabs ?Take 5,000 Units by mouth daily. ?  ? ?  ? ? Follow-up Information   ? ? Care, [facility] Follow up.   ?Specialty: Home Health Services ?Why: for home health services HHPT, [place] ?Contact information: ?[address] ?[address] ?[place] [address] ?[phone] ? ? ?  ?  ? ? Luetta Nutting, DO Follow up in 1 week(s).   ?Specialty: Family Medicine ?Contact information: ?[address]  ?[address] ?[place]  [address] ?[phone] ? ? ?  ?  ? ? Park Liter, MD. Schedule an appointment as soon as possible for a visit in 1 week(s).   ?Specialty: Cardiology ?Contact information: ?[address] ?[place] [address] ?[phone] ? ? ?  ?

## 2021-08-11 ENCOUNTER — Telehealth: Payer: Self-pay | Admitting: General Practice

## 2021-08-11 NOTE — Telephone Encounter (Signed)
Transition Care Management Unsuccessful Follow-up Telephone Call ? ?Date of discharge and from where:  08/09/21 from [facility] ? ?Attempts:  1st Attempt ? ?Reason for unsuccessful TCM follow-up call:  Left voice message ? ?  ?

## 2021-08-13 ENCOUNTER — Telehealth: Payer: Self-pay | Admitting: Cardiology

## 2021-08-13 ENCOUNTER — Telehealth: Payer: Self-pay

## 2021-08-13 NOTE — Telephone Encounter (Signed)
Pt c/o BP issue: STAT if pt c/o blurred vision, one-sided weakness or slurred speech ? ?1. What are your last 5 BP readings? Sitting 110/60 standing 97/76, sitting 109/66 standing 84/54, sitting 110/60, standing 87/54, sitting 140/80 standing 78/50 ? ?2. Are you having any other symptoms (ex. Dizziness, headache, blurred vision, passed out)? Dizziness, had 2 major falls last week and was admitted to the hospital on 08-04-21 and stayed until 08-09-21 ? ?3. What is your BP issue? Low blood pressure when standing- he needs to be seen asap ? ?

## 2021-08-13 NOTE — Telephone Encounter (Signed)
Mark Thomas with Mark Thomas called and left a message. He states Mark Thomas's pain level today is 8/10. He didn't take his pain medication yesterday. He will take it today. ? ?His blood pressure after sitting for a while was 124/70. Sitting after standing 87/54. When he stands he gets dizzy.  ? ? ?

## 2021-08-13 NOTE — Telephone Encounter (Signed)
Spoke with pts wife Anne Ng, per DPR. Per Dr. Wendy Poet note. Appt made to come in Monday April 17 @ 2:40PM. Talked with spouse about making sure pt was sitting slowly, standing slowing and using assistive devices. Encouraged increase in fluids to help with blood pressure. Spouse verbalized understanding and agreed. ?

## 2021-08-16 ENCOUNTER — Ambulatory Visit (INDEPENDENT_AMBULATORY_CARE_PROVIDER_SITE_OTHER): Payer: Medicare Other | Admitting: Cardiology

## 2021-08-16 VITALS — BP 90/46 | HR 76 | Ht 73.0 in | Wt 258.6 lb

## 2021-08-16 DIAGNOSIS — I350 Nonrheumatic aortic (valve) stenosis: Secondary | ICD-10-CM

## 2021-08-16 DIAGNOSIS — I951 Orthostatic hypotension: Secondary | ICD-10-CM

## 2021-08-16 DIAGNOSIS — I341 Nonrheumatic mitral (valve) prolapse: Secondary | ICD-10-CM

## 2021-08-16 DIAGNOSIS — I10 Essential (primary) hypertension: Secondary | ICD-10-CM | POA: Diagnosis not present

## 2021-08-16 DIAGNOSIS — Z955 Presence of coronary angioplasty implant and graft: Secondary | ICD-10-CM

## 2021-08-16 DIAGNOSIS — Z950 Presence of cardiac pacemaker: Secondary | ICD-10-CM

## 2021-08-16 DIAGNOSIS — I2510 Atherosclerotic heart disease of native coronary artery without angina pectoris: Secondary | ICD-10-CM | POA: Diagnosis not present

## 2021-08-16 MED ORDER — MIDODRINE HCL 5 MG PO TABS: 5.0000 mg | ORAL_TABLET | Freq: Three times a day (TID) | ORAL | 3 refills | Status: DC

## 2021-08-16 NOTE — Progress Notes (Signed)
?Cardiology Office Note:   ? ?Date:  08/16/2021  ? ?ID:  Mark Thomas, DOB August 29, 1950, MRN [id number] ? ?PCP:  Luetta Nutting, DO  ?Cardiologist:  Jenne Campus, MD   ? ?Referring MD: Luetta Nutting, DO  ? ?Chief Complaint  ?Patient presents with  ? low BP  ? ? ?History of Present Illness:   ? ?Mark Thomas is a 71 y.o. male   with a very complex past medical history.  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  Also history of hypertension which seems to be somewhat difficult to control, sick sinus syndrome with required permanent pacemaker history of alcohol abuse but now in remission for last 3 years aortic stenosis which was assessed in January 2022 as moderate with mean gradient of 20 dimensionless index 0.37 repeated echocardiogram done in September this year in [place] showed mild aortic stenosis. ?He would like to be established as a patient my practice.  He is moving to this area and overall seems to be doing well the biggest problem is balance issue.  Apparently he is going to physical therapy to improve it.  Also have a chronic problem with the right shoulder and surgery is contemplated.  ?Recently in December he end up suffering from COVID-19 infection since that time 3 times he ended up having treatment with antibiotic for pneumonia.  Recently he ended up in the hospital because of orthostatic hypotension and falls and pneumonia treated aggressively with antibiotic as well as steroids and he is back in my office.  He feels weak tired exhausted he gets up he got dizzy and he does have significant orthostatic hypotension noted today on the physical  examination.  Denies have any chest pain tightness squeezing pressure mid chest, there was issue about potentially having surgery on his shoulder however does been postponed waiting improvement in his overall condition. ? ?Past Medical History:  ?Diagnosis Date  ? Anxiety   ? Coronary artery disease   ? Depression   ? Essential tremor   ? GERD (gastroesophageal reflux disease)   ? High cholesterol   ? Hypertension   ? Myocardial infarct [facility])   ? Orthostatic hypotension   ? Stroke [facility])   ? Vascular dementia ([place])   ? ? ?Past Surgical History:  ?Procedure Laterality Date  ? APPENDECTOMY    ? CARDIAC SURGERY    ? CHOLECYSTECTOMY    ? HERNIA REPAIR    ? NASAL SINUS SURGERY    ? SHOULDER SURGERY    ? ? ?Current Medications: ?Current Meds  ?Medication Sig  ? albuterol (PROVENTIL) (2.5 MG/3ML) 0.083% nebulizer solution Take 3 mLs (2.5 mg total) by nebulization every 6 (six) hours as needed for wheezing or shortness of breath.  ? albuterol (VENTOLIN HFA) 108 (90 Base) MCG/ACT inhaler Inhale 2 puffs into the lungs every 6 (six) hours as needed for wheezing or shortness of breath.  ? allopurinol (ZYLOPRIM) 100 MG tablet Take 100 mg by mouth daily.  ? aspirin 81 MG EC tablet Take 1 tablet by mouth daily.  ? atorvastatin (LIPITOR) 80 MG tablet Take 80 mg by mouth daily.  ? b complex vitamins capsule  Take 1 capsule by mouth daily.  ? Cholecalciferol (VITAMIN D-3) 125 MCG (5000 UT) TABS Take 5,000 Units by mouth daily.  ? clopidogrel (PLAVIX) 75 MG tablet Take 75 mg by mouth daily.  ? escitalopram (LEXAPRO) 20 MG tablet Take 1.5 tablets (30 mg total) by mouth daily.  ? ferrous sulfate 325 (65 FE) MG EC tablet Take 1 tablet (325 mg total) by mouth in the morning and at bedtime.  ? HYDROcodone-acetaminophen (NORCO/VICODIN) 5-325 MG tablet Take 1 tablet by mouth at bedtime. As needed at night  ? latanoprost (XALATAN) 0.005 % ophthalmic solution Place 1 drop into both eyes at bedtime.  ? magnesium oxide (MAG-OX) 400 MG tablet Take  400 mg by mouth daily.  ? memantine (NAMENDA) 10 MG tablet Take 1 tablet (10 mg total) by mouth 2 (two) times daily.  ? midodrine (PROAMATINE) 2.5 MG tablet Take 1 tablet (2.5 mg total) by mouth 3 (three) times daily with meals.  ? Mometasone Furo-Formoterol Fum (DULERA IN) Inhale 2 puffs into the lungs 2 (two) times daily.  ? nitroGLYCERIN (NITROLINGUAL) 0.4 MG/SPRAY spray Place 1 spray under the tongue every 5 (five) minutes x 3 doses as needed for chest pain.  ? pantoprazole (PROTONIX) 40 MG tablet Take 1 tablet (40 mg total) by mouth every evening.  ? potassium citrate (UROCIT-K) 10 MEQ (1080 MG) SR tablet Take 10 mEq by mouth daily.  ? primidone (MYSOLINE) 50 MG tablet Take 2 tablets (100 mg total) by mouth 2 (two) times daily.  ? QUEtiapine (SEROQUEL) 50 MG tablet Take 2 tablets (100 mg total) by mouth at bedtime.  ? ranolazine (RANEXA) 1000 MG SR tablet Take 1,000 mg by mouth 2 (two) times daily.  ? rOPINIRole (REQUIP XL) 4 MG 24 hr tablet Take 1 tablet (4 mg total) by mouth at bedtime.  ? triamcinolone ointment (KENALOG) 0.1 % Apply 1 application. topically as needed (rash).  ?  ? ?Allergies:   Dilaudid [hydromorphone], Bactrim [sulfamethoxazole-trimethoprim], Benadryl [diphenhydramine], and Phenobarbital  ? ?Social History  ? ?Socioeconomic History  ? Marital status: Married  ?  Spouse name: Moses Odoherty  ? Number of children: 2  ? Years of education: 31  ? Highest education level: Master's degree (e.g., MA, MS, MEng, MEd, MSW, MBA)  ?Occupational History  ? Occupation: Retired  ?Tobacco Use  ? Smoking status: Never  ?  Passive exposure: Never  ? Smokeless tobacco: Never  ?Vaping Use  ? Vaping Use: Never used  ?Substance and Sexual Activity  ? Alcohol use: Not Currently  ? Drug use: Never  ? Sexual activity: Not on file  ?Other Topics Concern  ? Not on file  ?Social History Narrative  ? Lives with his wife. He has vascular dementia. His wife is his primary caretaker.He enjoys watching football and  spending time with his grandchildren.  ? ?Social Determinants of Health  ? ?Financial Resource Strain: Low Risk   ? Difficulty of Paying Living Expenses: Not hard at all  ?Food Insecurity: No Food Insecurity  ? Worried About Charity fundraiser in the Last Year: Never true  ? Ran Out of Food in the Last Year: Never true  ?Transportation Needs: No Transportation Needs  ? Lack of Transportation (Medical): No  ? Lack of Transportation (Non-Medical): No  ?Physical Activity: Insufficiently Active  ? Days of Exercise per Week: 2 days  ? Minutes of Exercise per Session: 50 min  ?Stress: No Stress Concern Present  ? Feeling of Stress : Not at all  ?  Social Connections: Socially Integrated  ? Frequency of Communication with Friends and Family: More than three times a week  ? Frequency of Social Gatherings with Friends and Family: Once a week  ? Attends Religious Services: More than 4 times per year  ? Active Member of Clubs or Organizations: Yes  ? Attends Archivist Meetings: More than 4 times per year  ? Marital Status: Married  ?  ? ?Family History: ?The patient's family history includes Hypertension in his father and mother; Stroke in his father and mother. ?ROS:   ?Please see the history of present illness.    ?All 14 point review of systems negative except as described per history of present illness ? ?EKGs/Labs/Other Studies Reviewed:   ? ? ? ?Recent Labs: ?02/08/2021: TSH 3.000 ?03/18/2021: NT-Pro BNP 370 ?08/04/2021: ALT 64; B Natriuretic Peptide 210.0; Magnesium 2.3 ?08/06/2021: Hemoglobin 12.8; Platelets 139 ?08/08/2021: BUN 15; Creatinine, Ser 1.09; Potassium 4.6; Sodium 136  ?Recent Lipid Panel ?   ?Component Value Date/Time  ? CHOL 131 01/07/2021 1804  ? TRIG 152 (H) 01/07/2021 1804  ? HDL 47 01/07/2021 1804  ? CHOLHDL 2.8 01/07/2021 1804  ? VLDL 30 01/07/2021 1804  ? [place] 54 01/07/2021 1804  ? ? ?Physical Exam:   ? ?VS:  There were no vitals taken for this visit.   ? ?Wt Readings from Last 3 Encounters:   ?08/08/21 253 lb (114.8 kg)  ?07/28/21 266 lb (120.7 kg)  ?07/07/21 261 lb (118.4 kg)  ?  ? ?GEN:  Well nourished, well developed in no acute distress ?HEENT: Normal ?NECK: No JVD; No carotid bruits ?LYMP

## 2021-08-16 NOTE — Telephone Encounter (Signed)
Transition Care Management Unsuccessful Follow-up Telephone Call ? ?Date of discharge and from where:  08/09/21 from [facility] ? ?Attempts:  2nd Attempt ? ?Reason for unsuccessful TCM follow-up call:  No answer/busy Patient has an appt with PCP on 08/17/21. ? ?  ?

## 2021-08-16 NOTE — Patient Instructions (Signed)
Medication Instructions:  ?Your physician has recommended you make the following change in your medication: START MIDODRINE 93m TID - START IN 2 DAYS if NEEDED  ?*If you need a refill on your cardiac medications before your next appointment, please call your pharmacy* ? ? ?Lab Work: ?None Ordered ?If you have labs (blood work) drawn today and your tests are completely normal, you will receive your results only by: ?MyChart Message (if you have MyChart) OR ?A paper copy in the mail ?If you have any lab test that is abnormal or we need to change your treatment, we will call you to review the results. ? ? ?Testing/Procedures: ?Your physician has requested that you have an echocardiogram. [place]. Echocardiography is a painless test that uses sound waves to create images of your heart. It provides your doctor with information about the size and shape of your heart and how well your heart?s chambers and valves are working. This procedure takes approximately one hour. There are no restrictions for this procedure.  ? ? ?Follow-Up: ?At [facility] you and your health needs are our priority.  As part of our continuing mission to provide you with exceptional heart care, we have created designated Provider Care Teams.  These Care Teams include your primary Cardiologist (physician) and Advanced Practice Providers (APPs -  Physician Assistants and Nurse Practitioners) who all work together to provide you with the care you need, when you need it. ? ?We recommend signing up for the patient portal called "MyChart".  Sign up information is provided on this After Visit Summary.  MyChart is used to connect with patients for Virtual Visits (Telemedicine).  Patients are able to view lab/test results, encounter notes, upcoming appointments, etc.  Non-urgent messages can be sent to your provider as well.   ?To learn more about what you can do with MyChart, go to [email or web address]   ? ?Your next appointment:   ?1  month(s) ? ?The format for your next appointment:   ?In Person ? ?Provider:   ?RJenne Campus MD  ? ? ?Other Instructions ?None ? ?Important Information About Sugar ? ? ? ? ? . ?

## 2021-08-17 ENCOUNTER — Ambulatory Visit (INDEPENDENT_AMBULATORY_CARE_PROVIDER_SITE_OTHER): Payer: Medicare Other | Admitting: Family Medicine

## 2021-08-17 ENCOUNTER — Encounter: Payer: Self-pay | Admitting: Family Medicine

## 2021-08-17 VITALS — BP 86/54 | HR 75 | Ht 73.0 in | Wt 258.6 lb

## 2021-08-17 DIAGNOSIS — S301XXA Contusion of abdominal wall, initial encounter: Secondary | ICD-10-CM | POA: Diagnosis not present

## 2021-08-17 DIAGNOSIS — J189 Pneumonia, unspecified organism: Secondary | ICD-10-CM

## 2021-08-17 DIAGNOSIS — I10 Essential (primary) hypertension: Secondary | ICD-10-CM

## 2021-08-17 DIAGNOSIS — I951 Orthostatic hypotension: Secondary | ICD-10-CM

## 2021-08-17 MED ORDER — HYDROCODONE-ACETAMINOPHEN 5-325 MG PO TABS: 1.0000 | ORAL_TABLET | Freq: Four times a day (QID) | ORAL | 0 refills | Status: DC | PRN

## 2021-08-17 NOTE — Assessment & Plan Note (Signed)
Discussed that this should resolve over time.  Can try heat and lidocaine patches.  Norco renewed for a few days.   ?

## 2021-08-17 NOTE — Assessment & Plan Note (Signed)
He remains hypotensive.  Continue increased sodium intake and titrate midodrine to 16m TID.  Can consider addition of florinef as well if he remains hypotensive.   ?

## 2021-08-17 NOTE — Progress Notes (Signed)
?Mark Thomas - 71 y.o. male MRN [id number]  Date of birth: Sep 26, 1950 ? ?Subjective ?Chief Complaint  ?Patient presents with  ? Hospitalization Follow-up  ? ? ?HPI ?Mark Thomas is a 71 y.o. male here today for hospital follow up.  Recent admission for multifocal pneumonia.  Had fall at home. He was found to be hypotensive in the hospital.  All antihypertensive medications were held and midodrine added.  He has remained hypotensive with some dizziness since discharge.  He has had follow up with cardiology and recommend increasing sodium intake and increase midodrine if BP remains low.  He does have an ECHO set up as well.  He has had recurrent/non-resolving pneumonia since having COVID in December.  He has been on multiple rounds of steroids and antibiotics.  PCCM consulted during hospitalization.  HRCT completed and labs ordered for work up for ILD.   Normal inflammatory markers and sputum culture unremarkable.  He has follow up with pulmonology regarding this as well.   ? ?He does have bruising and a moderate size hematoma on his right flank.  Hematoma is painful.  He was prescribed norco for pain control.  He is requesting a few additional of these.  ? ?ROS:  A comprehensive ROS was completed and negative except as noted per HPI ? ?Allergies  ?Allergen Reactions  ? Dilaudid [Hydromorphone] Other (See Comments)  ?  Respiratory Arrest  ? Bactrim [Sulfamethoxazole-Trimethoprim] Rash  ? Benadryl [Diphenhydramine] Other (See Comments)  ?  Muscle spasm   ? Phenobarbital   ? ? ?Past Medical History:  ?Diagnosis Date  ? Anxiety   ? Coronary artery disease   ? Depression   ? Essential tremor   ? GERD (gastroesophageal reflux disease)   ? High cholesterol   ? Hypertension   ? Myocardial infarct [facility])   ? Orthostatic hypotension   ? Stroke [facility]   ? Vascular dementia ([place])   ? ? ?Past Surgical History:  ?Procedure Laterality Date  ? APPENDECTOMY    ? CARDIAC SURGERY    ? CHOLECYSTECTOMY    ? HERNIA REPAIR    ? NASAL  SINUS SURGERY    ? SHOULDER SURGERY    ? ? ?Social History  ? ?Socioeconomic History  ? Marital status: Married  ?  Spouse name: Javeion Cannedy  ? Number of children: 2  ? Years of education: 36  ? Highest education level: Master's degree (e.g., MA, MS, MEng, MEd, MSW, MBA)  ?Occupational History  ? Occupation: Retired  ?Tobacco Use  ? Smoking status: Never  ?  Passive exposure: Never  ? Smokeless tobacco: Never  ?Vaping Use  ? Vaping Use: Never used  ?Substance and Sexual Activity  ? Alcohol use: Not Currently  ? Drug use: Never  ? Sexual activity: Not on file  ?Other Topics Concern  ? Not on file  ?Social History Narrative  ? Lives with his wife. He has vascular dementia. His wife is his primary caretaker.He enjoys watching football and spending time with his grandchildren.  ? ?Social Determinants of Health  ? ?Financial Resource Strain: Low Risk   ? Difficulty of Paying Living Expenses: Not hard at all  ?Food Insecurity: No Food Insecurity  ? Worried About Charity fundraiser in the Last Year: Never true  ? Ran Out of Food in the Last Year: Never true  ?Transportation Needs: No Transportation Needs  ? Lack of Transportation (Medical): No  ? Lack of Transportation (Non-Medical): No  ?Physical Activity: Insufficiently Active  ?  Days of Exercise per Week: 2 days  ? Minutes of Exercise per Session: 50 min  ?Stress: No Stress Concern Present  ? Feeling of Stress : Not at all  ?Social Connections: Socially Integrated  ? Frequency of Communication with Friends and Family: More than three times a week  ? Frequency of Social Gatherings with Friends and Family: Once a week  ? Attends Religious Services: More than 4 times per year  ? Active Member of Clubs or Organizations: Yes  ? Attends Archivist Meetings: More than 4 times per year  ? Marital Status: Married  ? ? ?Family History  ?Problem Relation Age of Onset  ? Hypertension Mother   ? Stroke Mother   ? Stroke Father   ? Hypertension Father   ? ? ?Health  Maintenance  ?Topic Date Due  ? Zoster Vaccines- Shingrix (1 of 2) Never done  ? COVID-19 Vaccine (4 - Booster for Moderna series) 06/11/2020  ? Hepatitis C Screening  03/15/2022 (Originally 12/22/1968)  ? INFLUENZA VACCINE  11/30/2021  ? TETANUS/TDAP  09/19/2024  ? COLONOSCOPY (Pts 45-86yr Insurance coverage will need to be confirmed)  08/09/2025  ? Pneumonia Vaccine 69 Years old  Completed  ? HPV VACCINES  Aged Out  ? ? ? ?----------------------------------------------------------------------------------------------------------------------------------------------------------------------------------------------------------------- ?Physical Exam ?BP (!) 86/54 (BP Location: Left Arm, Patient Position: Sitting, Cuff Size: Normal)   Pulse 75   Ht _0  (1.854 m)   Wt 258 lb 9.6 oz (117.3 kg)   SpO2 99%   BMI 34.12 kg/m?  ? ?Physical Exam ?Constitutional:   ?   Appearance: Normal appearance.  ?Eyes:  ?   General: No scleral icterus. ?Cardiovascular:  ?   Rate and Rhythm: Normal rate and regular rhythm.  ?   Pulses: Normal pulses.  ?   Heart sounds: Normal heart sounds.  ?Pulmonary:  ?   Effort: Pulmonary effort is normal.  ?   Breath sounds: Normal breath sounds.  ?Musculoskeletal:  ?   Cervical back: Neck supple.  ?Neurological:  ?   General: No focal deficit present.  ?   Mental Status: He is alert.  ?Psychiatric:     ?   Mood and Affect: Mood normal.     ?   Behavior: Behavior normal.  ? ? ?------------------------------------------------------------------------------------------------------------------------------------------------------------------------------------------------------------------- ?Assessment and Plan ? ?Orthostatic hypotension ?He remains hypotensive.  Continue increased sodium intake and titrate midodrine to 579mTID.  Can consider addition of florinef as well if he remains hypotensive.   ? ?Community acquired pneumonia ?Recurrent pneumonia.  S/p IV antibiotics with completion of levaquin as  well.  Doing ok from a respiratory standpoint.  He will follow up with pulmonology as well.  ? ?Hematoma of flank ?Discussed that this should resolve over time.  Can try heat and lidocaine patches.  Norco renewed for a few days.   ? ? ?Meds ordered this encounter  ?Medications  ? HYDROcodone-acetaminophen (NORCO/VICODIN) 5-325 MG tablet  ?  Sig: Take 1 tablet by mouth every 6 (six) hours as needed for moderate pain. As needed at night  ?  Dispense:  20 tablet  ?  Refill:  0  ? ? ?Return in about 6 weeks (around 09/28/2021) for F/u Pneumonia/BP. ? ? ? ?This visit occurred during the SARS-CoV-2 public health emergency.  Safety protocols were in place, including screening questions prior to the visit, additional usage of staff PPE, and extensive cleaning of exam room while observing appropriate contact time as indicated for disinfecting solutions.  ? ?

## 2021-08-17 NOTE — Assessment & Plan Note (Signed)
Recurrent pneumonia.  S/p IV antibiotics with completion of levaquin as well.  Doing ok from a respiratory standpoint.  He will follow up with pulmonology as well.  ?

## 2021-08-17 NOTE — Telephone Encounter (Signed)
Home health advised.  ?

## 2021-08-18 ENCOUNTER — Institutional Professional Consult (permissible substitution): Payer: Medicare Other | Admitting: Pulmonary Disease

## 2021-08-18 LAB — COMPLETE METABOLIC PANEL WITH GFR
AG Ratio: 1.5 (calc) (ref 1.0–2.5)
ALT: 53 U/L — ABNORMAL HIGH (ref 9–46)
AST: 26 U/L (ref 10–35)
Albumin: 3.8 g/dL (ref 3.6–5.1)
Alkaline phosphatase (APISO): 68 U/L (ref 35–144)
BUN/Creatinine Ratio: 15 (calc) (ref 6–22)
BUN: 21 mg/dL (ref 7–25)
CO2: 22 mmol/L (ref 20–32)
Calcium: 9.4 mg/dL (ref 8.6–10.3)
Chloride: 105 mmol/L (ref 98–110)
Creat: 1.41 mg/dL — ABNORMAL HIGH (ref 0.70–1.35)
Globulin: 2.5 g/dL (calc) (ref 1.9–3.7)
Glucose, Bld: 154 mg/dL — ABNORMAL HIGH (ref 65–139)
Potassium: 4.6 mmol/L (ref 3.5–5.3)
Sodium: 138 mmol/L (ref 135–146)
Total Bilirubin: 0.5 mg/dL (ref 0.2–1.2)
Total Protein: 6.3 g/dL (ref 6.1–8.1)
eGFR: 54 mL/min/{1.73_m2} — ABNORMAL LOW (ref >=60)

## 2021-08-18 LAB — CBC WITH DIFFERENTIAL/PLATELET
Absolute Monocytes: 442 cells/uL (ref 200–950)
Basophils Absolute: 40 cells/uL (ref 0–200)
Basophils Relative: 0.6 %
Eosinophils Absolute: 121 cells/uL (ref 15–500)
Eosinophils Relative: 1.8 %
HCT: 38.4 % — ABNORMAL LOW (ref 38.5–50.0)
Hemoglobin: 12.7 g/dL — ABNORMAL LOW (ref 13.2–17.1)
Lymphs Abs: 2023 cells/uL (ref 850–3900)
MCH: 33.3 pg — ABNORMAL HIGH (ref 27.0–33.0)
MCHC: 33.1 g/dL (ref 32.0–36.0)
MCV: 100.8 fL — ABNORMAL HIGH (ref 80.0–100.0)
MPV: 9.5 fL (ref 7.5–12.5)
Monocytes Relative: 6.6 %
Neutro Abs: 4074 cells/uL (ref 1500–7800)
Neutrophils Relative %: 60.8 %
Platelets: 180 10*3/uL (ref 140–400)
RBC: 3.81 10*6/uL — ABNORMAL LOW (ref 4.20–5.80)
RDW: 13.7 % (ref 11.0–15.0)
Total Lymphocyte: 30.2 %
WBC: 6.7 10*3/uL (ref 3.8–10.8)

## 2021-08-18 NOTE — Telephone Encounter (Signed)
Transition Care Management Follow-up Telephone Call ?Date of discharge and from where: 08/09/21 from [facility] ?How have you been since you were released from the hospital? Patient had OV with PCP on 08/17/21. ?Any questions or concerns? No ? ?

## 2021-08-18 NOTE — Progress Notes (Deleted)
Synopsis: Referred in April 2023 for ILD  Subjective:   PATIENT ID: Mark Thomas GENDER: male DOB: [birth year], MRN: [id number]  HPI  No chief complaint on file.  Mark Thomas is a 71 year old male, never smoker with GERD, hypertension, paroxysmal atrial fibrillation, CKD III and CVA who is referred to pulmonary clinic for evaluation of interstitial lung disease.   He was admitted 4/6 to 4/10 for recurrent pneumonia due to concern of aspiration. He was evaluated by Speech with bedside eval with low concern for aspiration risk and recommended further evaluation with outpatient MBS.   CT Chest 06/22/21 showed patchy groung-glass nodules in the right upper lobe, hyperinflated lungs and mild interstitial reticular pattern in the lower lobes. Inflammatory work up is unrevealing.   He had covid infection 04/2021. He was treated with levaquin in 06/2021. He was treated with ceftriaxone and azithromycin in the hospital.   Patient states he is not a smoker. He worked in [employer]. Was recently started on Symbicort January 2023 and he was taking albuterol nebs once a day. Wife states he uses his albuterol inhaler multiple times a day. Patient does not tolerate prednisone because it worsens his depression. Also, states he was given bactrim this year and had to immediately stop because he developed a rash.  Past Medical History:  Diagnosis Date   Anxiety    Coronary artery disease    Depression    Essential tremor    GERD (gastroesophageal reflux disease)    High cholesterol    Hypertension    Myocardial infarct (HCC)    Orthostatic hypotension    Stroke ([place])    Vascular dementia ([place])      Family History  Problem Relation Age of Onset   Hypertension Mother    Stroke Mother    Stroke Father    Hypertension Father      Social History   Socioeconomic History   Marital status: Married    Spouse name: Daishaun Ayre   Number of children: 2   Years of education: 20   Highest  education level: Master's degree (e.g., MA, MS, MEng, MEd, MSW, MBA)  Occupational History   Occupation: Retired  Tobacco Use   Smoking status: Never    Passive exposure: Never   Smokeless tobacco: Never  Vaping Use   Vaping Use: Never used  Substance and Sexual Activity   Alcohol use: Not Currently   Drug use: Never   Sexual activity: Not on file  Other Topics Concern   Not on file  Social History Narrative   Lives with his wife. He has vascular dementia. His wife is his primary caretaker.He enjoys watching football and spending time with his grandchildren.   Social Determinants of Health   Financial Resource Strain: Low Risk    Difficulty of Paying Living Expenses: Not hard at all  Food Insecurity: No Food Insecurity   Worried About Charity fundraiser in the Last Year: Never true   [place] in the Last Year: Never true  Transportation Needs: No Transportation Needs   Lack of Transportation (Medical): No   Lack of Transportation (Non-Medical): No  Physical Activity: Insufficiently Active   Days of Exercise per Week: 2 days   Minutes of Exercise per Session: 50 min  Stress: No Stress Concern Present   Feeling of Stress : Not at all  Social Connections: Socially Integrated   Frequency of Communication with Friends and Family: More than three times a week  Frequency of Social Gatherings with Friends and Family: Once a week   Attends Religious Services: More than 4 times per year   Active Member of [employer] or Organizations: Yes   Attends Music therapist: More than 4 times per year   Marital Status: Married  Human resources officer Violence: Not At Risk   Fear of Current or Ex-Partner: No   Emotionally Abused: No   Physically Abused: No   Sexually Abused: No     Allergies  Allergen Reactions   Dilaudid [Hydromorphone] Other (See Comments)    Respiratory Arrest   Bactrim [Sulfamethoxazole-Trimethoprim] Rash   Benadryl [Diphenhydramine] Other (See Comments)     Muscle spasm    Phenobarbital      Outpatient Medications Prior to Visit  Medication Sig Dispense Refill   albuterol (PROVENTIL) (2.5 MG/3ML) 0.083% nebulizer solution Take 3 mLs (2.5 mg total) by nebulization every 6 (six) hours as needed for wheezing or shortness of breath. 75 mL 12   albuterol (VENTOLIN HFA) 108 (90 Base) MCG/ACT inhaler Inhale 2 puffs into the lungs every 6 (six) hours as needed for wheezing or shortness of breath.     allopurinol (ZYLOPRIM) 100 MG tablet Take 100 mg by mouth daily.     aspirin 81 MG EC tablet Take 1 tablet by mouth daily.     atorvastatin (LIPITOR) 80 MG tablet Take 80 mg by mouth daily.     b complex vitamins capsule Take 1 capsule by mouth daily.     Cholecalciferol (VITAMIN D-3) 125 MCG (5000 UT) TABS Take 5,000 Units by mouth daily.     clopidogrel (PLAVIX) 75 MG tablet Take 75 mg by mouth daily.     escitalopram (LEXAPRO) 20 MG tablet Take 1.5 tablets (30 mg total) by mouth daily. 135 tablet 0   ferrous sulfate 325 (65 FE) MG EC tablet Take 1 tablet (325 mg total) by mouth in the morning and at bedtime. 180 tablet 1   HYDROcodone-acetaminophen (NORCO/VICODIN) 5-325 MG tablet Take 1 tablet by mouth every 6 (six) hours as needed for moderate pain. As needed at night 20 tablet 0   latanoprost (XALATAN) 0.005 % ophthalmic solution Place 1 drop into both eyes at bedtime.     magnesium oxide (MAG-OX) 400 MG tablet Take 400 mg by mouth daily.     memantine (NAMENDA) 10 MG tablet Take 1 tablet (10 mg total) by mouth 2 (two) times daily. 180 tablet 0   midodrine (PROAMATINE) 5 MG tablet Take 1 tablet (5 mg total) by mouth 3 (three) times daily with meals. 270 tablet 3   Mometasone Furo-Formoterol Fum (DULERA IN) Inhale 2 puffs into the lungs 2 (two) times daily.     nitroGLYCERIN (NITROLINGUAL) 0.4 MG/SPRAY spray Place 1 spray under the tongue every 5 (five) minutes x 3 doses as needed for chest pain.     pantoprazole (PROTONIX) 40 MG tablet Take 1 tablet  (40 mg total) by mouth every evening. 90 tablet 3   potassium citrate (UROCIT-K) 10 MEQ (1080 MG) SR tablet Take 10 mEq by mouth daily.     primidone (MYSOLINE) 50 MG tablet Take 2 tablets (100 mg total) by mouth 2 (two) times daily. 120 tablet 4   QUEtiapine (SEROQUEL) 50 MG tablet Take 2 tablets (100 mg total) by mouth at bedtime. 180 tablet 0   ranolazine (RANEXA) 1000 MG SR tablet Take 1,000 mg by mouth 2 (two) times daily.     rOPINIRole (REQUIP XL) 4 MG 24 hr  tablet Take 1 tablet (4 mg total) by mouth at bedtime. 90 tablet 0   triamcinolone ointment (KENALOG) 0.1 % Apply 1 application. topically as needed (rash).     No facility-administered medications prior to visit.   Review of Systems  Constitutional:  Negative for chills, fever, malaise/fatigue and weight loss.  HENT:  Negative for congestion, sinus pain and sore throat.   Eyes: Negative.   Respiratory:  Negative for cough, hemoptysis, sputum production, shortness of breath and wheezing.   Cardiovascular:  Negative for chest pain, palpitations, orthopnea, claudication and leg swelling.  Gastrointestinal:  Negative for abdominal pain, heartburn, nausea and vomiting.  Genitourinary: Negative.   Musculoskeletal:  Negative for joint pain and myalgias.  Skin:  Negative for rash.  Neurological:  Negative for weakness.  Endo/Heme/Allergies: Negative.   Psychiatric/Behavioral: Negative.     Objective:  There were no vitals filed for this visit.  Physical Exam Constitutional:      General: He is not in acute distress. HENT:     Head: Normocephalic and atraumatic.  Eyes:     Extraocular Movements: Extraocular movements intact.     Conjunctiva/sclera: Conjunctivae normal.     Pupils: Pupils are equal, round, and reactive to light.  Cardiovascular:     Rate and Rhythm: Normal rate and regular rhythm.     Pulses: Normal pulses.     Heart sounds: Normal heart sounds. No murmur heard. Abdominal:     General: Bowel sounds are  normal.     Palpations: Abdomen is soft.  Musculoskeletal:     Right lower leg: No edema.     Left lower leg: No edema.  Lymphadenopathy:     Cervical: No cervical adenopathy.  Skin:    General: Skin is warm and dry.  Neurological:     General: No focal deficit present.     Mental Status: He is alert.  Psychiatric:        Mood and Affect: Mood normal.        Behavior: Behavior normal.        Thought Content: Thought content normal.        Judgment: Judgment normal.   CBC    Component Value Date/Time   WBC 6.7 08/17/2021 1225   RBC 3.81 (L) 08/17/2021 1225   HGB 12.7 (L) 08/17/2021 1225   HCT 38.4 (L) 08/17/2021 1225   PLT 180 08/17/2021 1225   MCV 100.8 (H) 08/17/2021 1225   MCH 33.3 (H) 08/17/2021 1225   MCHC 33.1 08/17/2021 1225   RDW 13.7 08/17/2021 1225   LYMPHSABS 2,023 08/17/2021 1225   MONOABS 0.8 08/06/2021 0308   EOSABS 121 08/17/2021 1225   BASOSABS 40 08/17/2021 1225      Latest Ref Rng & Units 08/17/2021   12:25 PM 08/08/2021   12:08 PM 08/06/2021    3:08 AM  BMP  Glucose 65 - 139 mg/dL 154   95   102    BUN 7 - 25 mg/dL _0 Creatinine 0.70 - 1.35 mg/dL 1.41   1.09   1.10    BUN/Creat Ratio 6 - 22 (calc) 15      Sodium 135 - 146 mmol/L 138   136   137    Potassium 3.5 - 5.3 mmol/L 4.6   4.6   5.1    Chloride 98 - 110 mmol/L 105   106   105    CO2 20 - 32 mmol/L 22  23   25    Calcium 8.6 - 10.3 mg/dL 9.4   9.1   8.4     Chest imaging: CT Chest 06/22/21 1. Patchy ground-glass nodules in the RIGHT upper lobe are favored pulmonary infection. 2. Irregular RIGHT paratracheal lymph nodes favor reactive. Recommend follow-up CT in 3 months to demonstrate resolution of ground-glass nodules and evaluate adenopathy. 3. Mild interstitial lung disease at the lung bases.  PFT:     View : No data to display.          Labs:  Path:  Echo 01/08/21: LV EF 65-70%. Moderate LVH. RV systolic function.   Heart Catheterization:    Assessment &  Plan:   No diagnosis found.  Discussion: ***    Current Outpatient Medications:    albuterol (PROVENTIL) (2.5 MG/3ML) 0.083% nebulizer solution, Take 3 mLs (2.5 mg total) by nebulization every 6 (six) hours as needed for wheezing or shortness of breath., Disp: 75 mL, Rfl: 12   albuterol (VENTOLIN HFA) 108 (90 Base) MCG/ACT inhaler, Inhale 2 puffs into the lungs every 6 (six) hours as needed for wheezing or shortness of breath., Disp: , Rfl:    allopurinol (ZYLOPRIM) 100 MG tablet, Take 100 mg by mouth daily., Disp: , Rfl:    aspirin 81 MG EC tablet, Take 1 tablet by mouth daily., Disp: , Rfl:    atorvastatin (LIPITOR) 80 MG tablet, Take 80 mg by mouth daily., Disp: , Rfl:    b complex vitamins capsule, Take 1 capsule by mouth daily., Disp: , Rfl:    Cholecalciferol (VITAMIN D-3) 125 MCG (5000 UT) TABS, Take 5,000 Units by mouth daily., Disp: , Rfl:    clopidogrel (PLAVIX) 75 MG tablet, Take 75 mg by mouth daily., Disp: , Rfl:    escitalopram (LEXAPRO) 20 MG tablet, Take 1.5 tablets (30 mg total) by mouth daily., Disp: 135 tablet, Rfl: 0   ferrous sulfate 325 (65 FE) MG EC tablet, Take 1 tablet (325 mg total) by mouth in the morning and at bedtime., Disp: 180 tablet, Rfl: 1   HYDROcodone-acetaminophen (NORCO/VICODIN) 5-325 MG tablet, Take 1 tablet by mouth every 6 (six) hours as needed for moderate pain. As needed at night, Disp: 20 tablet, Rfl: 0   latanoprost (XALATAN) 0.005 % ophthalmic solution, Place 1 drop into both eyes at bedtime., Disp: , Rfl:    magnesium oxide (MAG-OX) 400 MG tablet, Take 400 mg by mouth daily., Disp: , Rfl:    memantine (NAMENDA) 10 MG tablet, Take 1 tablet (10 mg total) by mouth 2 (two) times daily., Disp: 180 tablet, Rfl: 0   midodrine (PROAMATINE) 5 MG tablet, Take 1 tablet (5 mg total) by mouth 3 (three) times daily with meals., Disp: 270 tablet, Rfl: 3   Mometasone Furo-Formoterol Fum (DULERA IN), Inhale 2 puffs into the lungs 2 (two) times daily., Disp: ,  Rfl:    nitroGLYCERIN (NITROLINGUAL) 0.4 MG/SPRAY spray, Place 1 spray under the tongue every 5 (five) minutes x 3 doses as needed for chest pain., Disp: , Rfl:    pantoprazole (PROTONIX) 40 MG tablet, Take 1 tablet (40 mg total) by mouth every evening., Disp: 90 tablet, Rfl: 3   potassium citrate (UROCIT-K) 10 MEQ (1080 MG) SR tablet, Take 10 mEq by mouth daily., Disp: , Rfl:    primidone (MYSOLINE) 50 MG tablet, Take 2 tablets (100 mg total) by mouth 2 (two) times daily., Disp: 120 tablet, Rfl: 4   QUEtiapine (SEROQUEL) 50 MG tablet, Take 2 tablets (  100 mg total) by mouth at bedtime., Disp: 180 tablet, Rfl: 0   ranolazine (RANEXA) 1000 MG SR tablet, Take 1,000 mg by mouth 2 (two) times daily., Disp: , Rfl:    rOPINIRole (REQUIP XL) 4 MG 24 hr tablet, Take 1 tablet (4 mg total) by mouth at bedtime., Disp: 90 tablet, Rfl: 0   triamcinolone ointment (KENALOG) 0.1 %, Apply 1 application. topically as needed (rash)., Disp: , Rfl:

## 2021-08-19 ENCOUNTER — Telehealth: Payer: Self-pay | Admitting: Family Medicine

## 2021-08-19 NOTE — Telephone Encounter (Signed)
PT Bahavin from [facility] called and said patients sitting bp was 110/62 and his standing bp is 86/56 with visible signs of dizziness.  ? ?He is also asking for another verbal order to continue PT  ? ?Once a week for 1wk ?Twice a week for 3wks ?Once a week for 3 wks  ?

## 2021-08-19 NOTE — Telephone Encounter (Signed)
Ok to continue PT.

## 2021-08-20 ENCOUNTER — Other Ambulatory Visit: Payer: Self-pay

## 2021-08-20 ENCOUNTER — Other Ambulatory Visit: Payer: Self-pay | Admitting: Neurology

## 2021-08-20 ENCOUNTER — Encounter: Payer: Self-pay | Admitting: Cardiology

## 2021-08-20 ENCOUNTER — Encounter: Payer: Self-pay | Admitting: Pulmonary Disease

## 2021-08-20 ENCOUNTER — Other Ambulatory Visit: Payer: Self-pay | Admitting: Family Medicine

## 2021-08-20 DIAGNOSIS — I10 Essential (primary) hypertension: Secondary | ICD-10-CM

## 2021-08-20 NOTE — Telephone Encounter (Signed)
Returned the call to [place] and gave VO to continue PT.  ?

## 2021-08-23 ENCOUNTER — Encounter: Payer: Self-pay | Admitting: Family Medicine

## 2021-08-23 ENCOUNTER — Encounter: Payer: Self-pay | Admitting: Cardiology

## 2021-08-23 ENCOUNTER — Telehealth (HOSPITAL_COMMUNITY): Payer: Self-pay | Admitting: Psychiatry

## 2021-08-23 ENCOUNTER — Encounter: Payer: Self-pay | Admitting: Neurology

## 2021-08-23 MED ORDER — ESCITALOPRAM OXALATE 20 MG PO TABS: 30.0000 mg | ORAL_TABLET | Freq: Every day | ORAL | 0 refills | Status: DC

## 2021-08-23 MED ORDER — FLUDROCORTISONE ACETATE 0.1 MG PO TABS: 0.1000 mg | ORAL_TABLET | Freq: Every day | ORAL | 1 refills | Status: DC

## 2021-08-23 NOTE — Telephone Encounter (Signed)
Spoke to wife  ?Informed her rx was sent.  ? ?Nothing Further Needed at this time.  ? ?

## 2021-08-23 NOTE — Telephone Encounter (Signed)
Per pt ?Needs refill on lexapro  ?[facility] ? ?Cb # [id number] ?

## 2021-08-24 ENCOUNTER — Other Ambulatory Visit: Payer: Self-pay | Admitting: *Deleted

## 2021-08-24 ENCOUNTER — Telehealth: Payer: Self-pay

## 2021-08-24 MED ORDER — PRIMIDONE 50 MG PO TABS: 100.0000 mg | ORAL_TABLET | Freq: Two times a day (BID) | ORAL | 0 refills | Status: AC

## 2021-08-24 NOTE — Telephone Encounter (Signed)
Caprice Red @ [facility] requesting VO for ADL transfers x 1 visit. ? ?Returned call and gave VO for 1 visit.  ?

## 2021-08-25 ENCOUNTER — Ambulatory Visit (HOSPITAL_BASED_OUTPATIENT_CLINIC_OR_DEPARTMENT_OTHER): Admission: RE | Admit: 2021-08-25 | Payer: Medicare Other | Source: Ambulatory Visit

## 2021-08-26 ENCOUNTER — Telehealth: Payer: Self-pay

## 2021-08-26 ENCOUNTER — Inpatient Hospital Stay (HOSPITAL_COMMUNITY)
Admission: EM | Admit: 2021-08-26 | Discharge: 2021-08-29 | DRG: 871 | Disposition: A | Discharge status: Home / Self Care | Payer: Medicare Other | Attending: Internal Medicine | Admitting: Internal Medicine

## 2021-08-26 ENCOUNTER — Emergency Department (HOSPITAL_COMMUNITY): Payer: Medicare Other

## 2021-08-26 ENCOUNTER — Observation Stay (HOSPITAL_COMMUNITY): Payer: Medicare Other

## 2021-08-26 ENCOUNTER — Other Ambulatory Visit: Payer: Self-pay

## 2021-08-26 ENCOUNTER — Encounter (HOSPITAL_COMMUNITY): Payer: Self-pay | Admitting: Emergency Medicine

## 2021-08-26 DIAGNOSIS — F0153 Vascular dementia, unspecified severity, with mood disturbance: Secondary | ICD-10-CM | POA: Diagnosis present

## 2021-08-26 DIAGNOSIS — Z8249 Family history of ischemic heart disease and other diseases of the circulatory system: Secondary | ICD-10-CM

## 2021-08-26 DIAGNOSIS — Z7951 Long term (current) use of inhaled steroids: Secondary | ICD-10-CM

## 2021-08-26 DIAGNOSIS — I951 Orthostatic hypotension: Secondary | ICD-10-CM | POA: Diagnosis present

## 2021-08-26 DIAGNOSIS — E7800 Pure hypercholesterolemia, unspecified: Secondary | ICD-10-CM | POA: Diagnosis present

## 2021-08-26 DIAGNOSIS — Z823 Family history of stroke: Secondary | ICD-10-CM

## 2021-08-26 DIAGNOSIS — Z79899 Other long term (current) drug therapy: Secondary | ICD-10-CM

## 2021-08-26 DIAGNOSIS — Z950 Presence of cardiac pacemaker: Secondary | ICD-10-CM

## 2021-08-26 DIAGNOSIS — D649 Anemia, unspecified: Secondary | ICD-10-CM | POA: Diagnosis present

## 2021-08-26 DIAGNOSIS — J122 Parainfluenza virus pneumonia: Secondary | ICD-10-CM | POA: Diagnosis present

## 2021-08-26 DIAGNOSIS — A419 Sepsis, unspecified organism: Principal | ICD-10-CM

## 2021-08-26 DIAGNOSIS — I5032 Chronic diastolic (congestive) heart failure: Secondary | ICD-10-CM | POA: Diagnosis present

## 2021-08-26 DIAGNOSIS — J9601 Acute respiratory failure with hypoxia: Secondary | ICD-10-CM

## 2021-08-26 DIAGNOSIS — F0150 Vascular dementia without behavioral disturbance: Secondary | ICD-10-CM | POA: Diagnosis present

## 2021-08-26 DIAGNOSIS — N1831 Chronic kidney disease, stage 3a: Secondary | ICD-10-CM | POA: Diagnosis present

## 2021-08-26 DIAGNOSIS — I130 Hypertensive heart and chronic kidney disease with heart failure and stage 1 through stage 4 chronic kidney disease, or unspecified chronic kidney disease: Secondary | ICD-10-CM | POA: Diagnosis present

## 2021-08-26 DIAGNOSIS — I495 Sick sinus syndrome: Secondary | ICD-10-CM

## 2021-08-26 DIAGNOSIS — N183 Chronic kidney disease, stage 3 unspecified: Secondary | ICD-10-CM | POA: Diagnosis present

## 2021-08-26 DIAGNOSIS — F32A Depression, unspecified: Secondary | ICD-10-CM | POA: Diagnosis present

## 2021-08-26 DIAGNOSIS — K219 Gastro-esophageal reflux disease without esophagitis: Secondary | ICD-10-CM | POA: Diagnosis present

## 2021-08-26 DIAGNOSIS — J189 Pneumonia, unspecified organism: Secondary | ICD-10-CM

## 2021-08-26 DIAGNOSIS — Z8616 Personal history of COVID-19: Secondary | ICD-10-CM

## 2021-08-26 DIAGNOSIS — Z955 Presence of coronary angioplasty implant and graft: Secondary | ICD-10-CM

## 2021-08-26 DIAGNOSIS — Z20822 Contact with and (suspected) exposure to covid-19: Secondary | ICD-10-CM | POA: Diagnosis present

## 2021-08-26 DIAGNOSIS — I10 Essential (primary) hypertension: Secondary | ICD-10-CM | POA: Diagnosis present

## 2021-08-26 DIAGNOSIS — I480 Paroxysmal atrial fibrillation: Secondary | ICD-10-CM | POA: Diagnosis present

## 2021-08-26 DIAGNOSIS — Z880 Allergy status to penicillin: Secondary | ICD-10-CM

## 2021-08-26 DIAGNOSIS — R0600 Dyspnea, unspecified: Secondary | ICD-10-CM

## 2021-08-26 DIAGNOSIS — Z8673 Personal history of transient ischemic attack (TIA), and cerebral infarction without residual deficits: Secondary | ICD-10-CM

## 2021-08-26 DIAGNOSIS — Z888 Allergy status to other drugs, medicaments and biological substances status: Secondary | ICD-10-CM

## 2021-08-26 DIAGNOSIS — I2510 Atherosclerotic heart disease of native coronary artery without angina pectoris: Secondary | ICD-10-CM | POA: Diagnosis present

## 2021-08-26 DIAGNOSIS — J849 Interstitial pulmonary disease, unspecified: Secondary | ICD-10-CM

## 2021-08-26 LAB — CBC WITH DIFFERENTIAL/PLATELET
Abs Immature Granulocytes: 0.05 10*3/uL (ref 0.00–0.07)
Basophils Absolute: 0 10*3/uL (ref 0.0–0.1)
Basophils Relative: 0 %
Eosinophils Absolute: 0 10*3/uL (ref 0.0–0.5)
Eosinophils Relative: 0 %
HCT: 34.6 % — ABNORMAL LOW (ref 39.0–52.0)
Hemoglobin: 11.6 g/dL — ABNORMAL LOW (ref 13.0–17.0)
Immature Granulocytes: 1 %
Lymphocytes Relative: 7 %
Lymphs Abs: 0.5 10*3/uL — ABNORMAL LOW (ref 0.7–4.0)
MCH: 34.5 pg — ABNORMAL HIGH (ref 26.0–34.0)
MCHC: 33.5 g/dL (ref 30.0–36.0)
MCV: 103 fL — ABNORMAL HIGH (ref 80.0–100.0)
Monocytes Absolute: 0.9 10*3/uL (ref 0.1–1.0)
Monocytes Relative: 12 %
Neutro Abs: 5.6 10*3/uL (ref 1.7–7.7)
Neutrophils Relative %: 80 %
Platelets: 197 10*3/uL (ref 150–400)
RBC: 3.36 MIL/uL — ABNORMAL LOW (ref 4.22–5.81)
RDW: 14.4 % (ref 11.5–15.5)
WBC: 7.1 10*3/uL (ref 4.0–10.5)
nRBC: 0 % (ref 0.0–0.2)

## 2021-08-26 LAB — RESP PANEL BY RT-PCR (FLU A&B, COVID) ARPGX2
Influenza A by PCR: NEGATIVE
Influenza B by PCR: NEGATIVE
SARS Coronavirus 2 by RT PCR: NEGATIVE

## 2021-08-26 LAB — URINALYSIS, ROUTINE W REFLEX MICROSCOPIC
Bilirubin Urine: NEGATIVE
Glucose, UA: NEGATIVE mg/dL
Hgb urine dipstick: NEGATIVE
Ketones, ur: NEGATIVE mg/dL
Leukocytes,Ua: NEGATIVE
Nitrite: NEGATIVE
Protein, ur: 30 mg/dL — AB
Specific Gravity, Urine: 1.014 (ref 1.005–1.030)
pH: 6 (ref 5.0–8.0)

## 2021-08-26 LAB — RESPIRATORY PANEL BY PCR

## 2021-08-26 LAB — COMPREHENSIVE METABOLIC PANEL
ALT: 24 U/L (ref 0–44)
AST: 35 U/L (ref 15–41)
Albumin: 3 g/dL — ABNORMAL LOW (ref 3.5–5.0)
Alkaline Phosphatase: 48 U/L (ref 38–126)
Anion gap: 12 (ref 5–15)
BUN: 16 mg/dL (ref 8–23)
CO2: 22 mmol/L (ref 22–32)
Calcium: 8.4 mg/dL — ABNORMAL LOW (ref 8.9–10.3)
Chloride: 100 mmol/L (ref 98–111)
Creatinine, Ser: 1.24 mg/dL (ref 0.61–1.24)
GFR, Estimated: >60 mL/min (ref >60)
Glucose, Bld: 163 mg/dL — ABNORMAL HIGH (ref 70–99)
Potassium: 4.5 mmol/L (ref 3.5–5.1)
Sodium: 134 mmol/L — ABNORMAL LOW (ref 135–145)
Total Bilirubin: 1.2 mg/dL (ref 0.3–1.2)
Total Protein: 6.2 g/dL — ABNORMAL LOW (ref 6.5–8.1)

## 2021-08-26 LAB — AMMONIA: Ammonia: 26 umol/L (ref 9–35)

## 2021-08-26 LAB — BRAIN NATRIURETIC PEPTIDE: B Natriuretic Peptide: 1096.4 pg/mL — ABNORMAL HIGH (ref 0.0–100.0)

## 2021-08-26 LAB — LACTIC ACID, PLASMA: Lactic Acid, Venous: 1.8 mmol/L (ref 0.5–1.9)

## 2021-08-26 LAB — PROTIME-INR
INR: 1.1 (ref 0.8–1.2)
Prothrombin Time: 13.8 seconds (ref 11.4–15.2)

## 2021-08-26 LAB — APTT: aPTT: 30 seconds (ref 24–36)

## 2021-08-26 LAB — LIPASE, BLOOD: Lipase: 23 U/L (ref 11–51)

## 2021-08-26 LAB — PROCALCITONIN: Procalcitonin: 0.14 ng/mL

## 2021-08-26 LAB — MAGNESIUM: Magnesium: 1.6 mg/dL — ABNORMAL LOW (ref 1.7–2.4)

## 2021-08-26 LAB — TSH: TSH: 1.055 u[IU]/mL (ref 0.350–4.500)

## 2021-08-26 LAB — VITAMIN B12: Vitamin B-12: 299 pg/mL (ref 180–914)

## 2021-08-26 IMAGING — CT CT ABD-PELV W/ CM
2 of 5 series · 14 of 46 positions shown, 16 images · IV contrast (APPLIED)
Comparison: [DATE], renal sonogram [DATE]

CLINICAL DATA: Abdominal pain, acute, nonlocalized

EXAM:
CT ABDOMEN AND PELVIS WITH CONTRAST
TECHNIQUE: Multidetector CT imaging of the abdomen and pelvis was performed
using the standard protocol following bolus administration of
intravenous contrast.

[Series 3: abd/ pelvis 5.0 i30f 2 · axial · 0.98mm/px · z∈[-990,-480]mm · 11 of 114 slices shown, 13 images]
[im 6/114  soft-tissue]
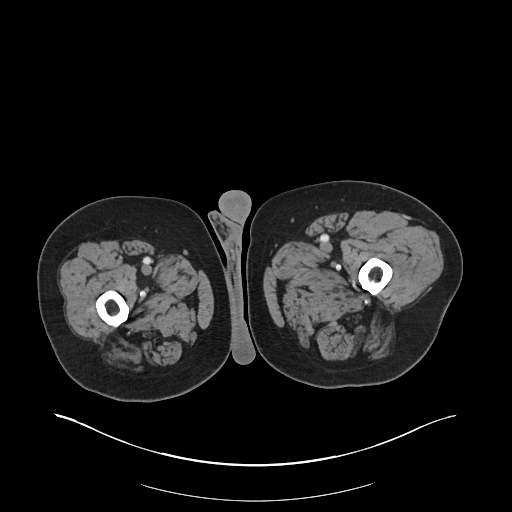
[im 6/114  bone]
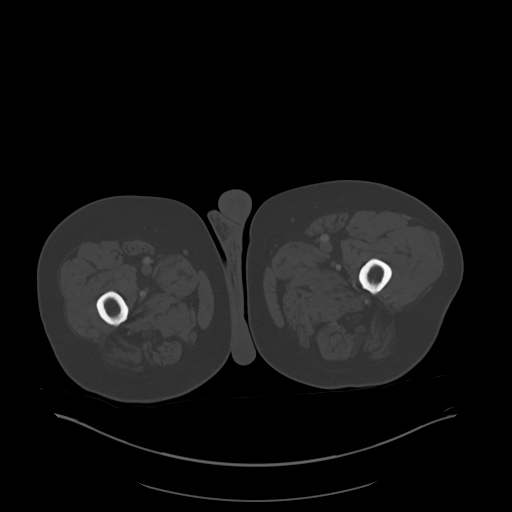
[im 17/114  soft-tissue]
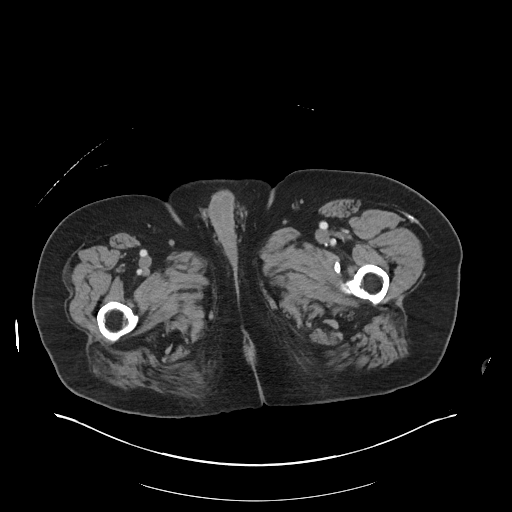
[im 29/114  soft-tissue]
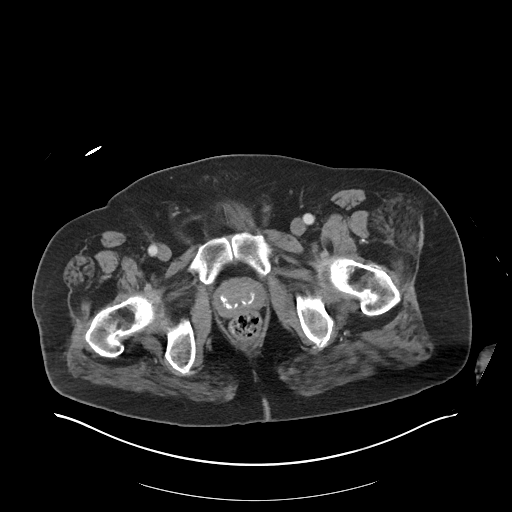
[im 40/114  soft-tissue]
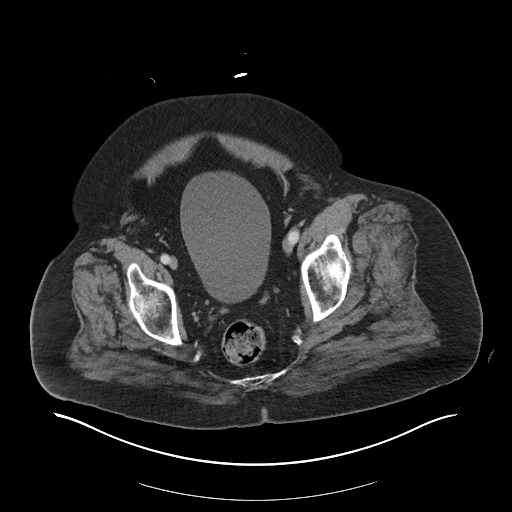
[im 46/114  soft-tissue]
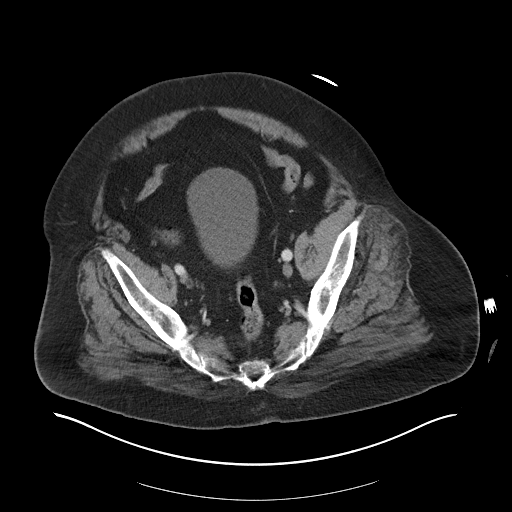
[im 57/114  soft-tissue]
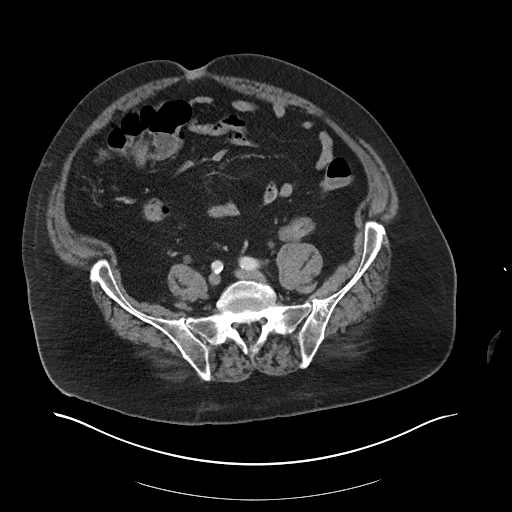
[im 68/114  soft-tissue]
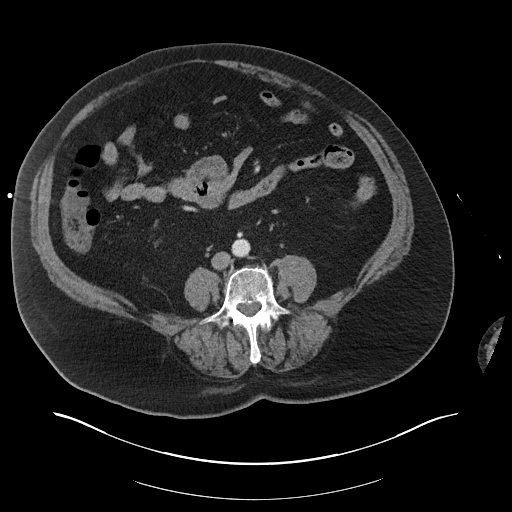
[im 74/114  soft-tissue]
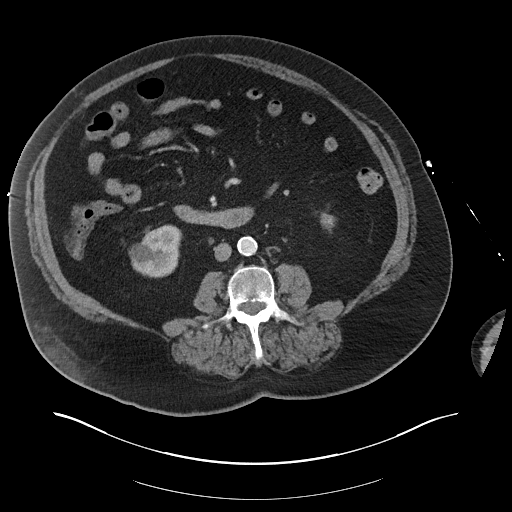
[im 85/114  soft-tissue]
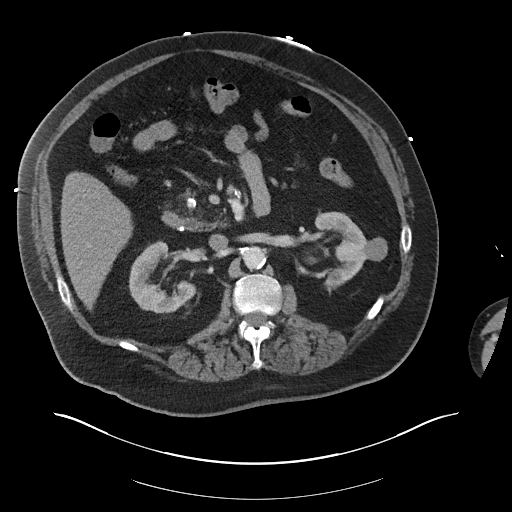
[im 85/114  bone]
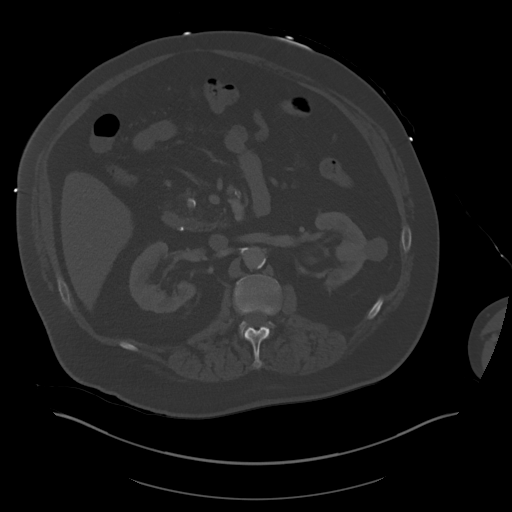
[im 97/114  soft-tissue]
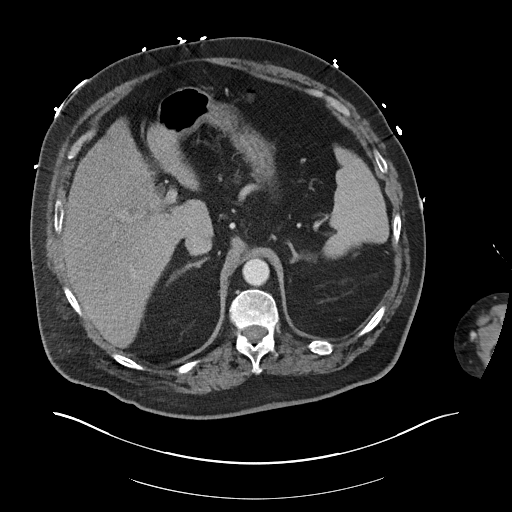
[im 108/114  soft-tissue]
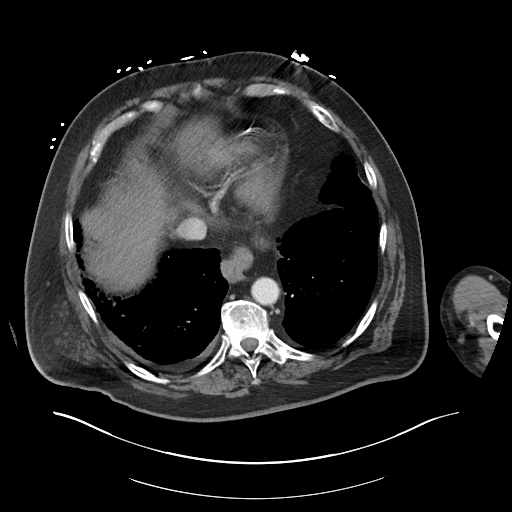

[Series 6: coronal soft tissue · coronal · 0.94mm/px · 3 of 145 slices shown]
[im 49/145  soft-tissue]
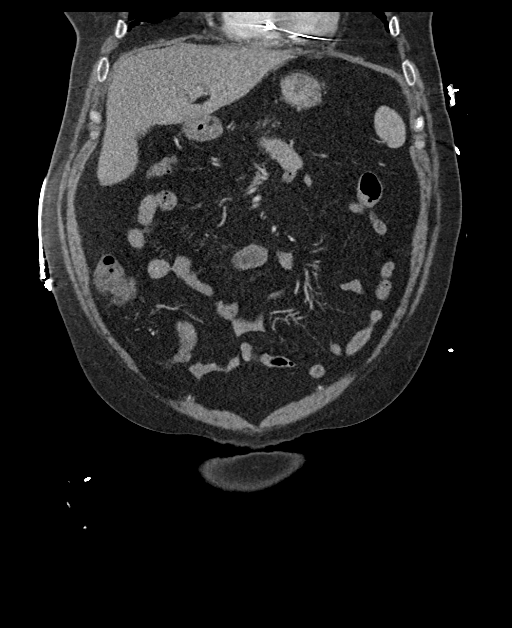
[im 65/145  soft-tissue]
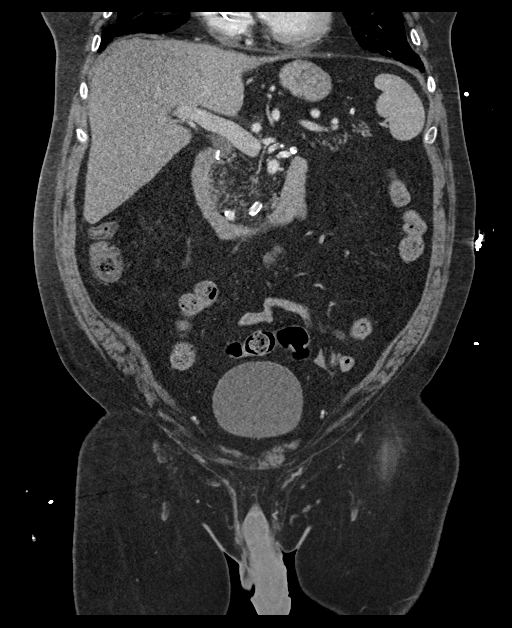
[im 81/145  soft-tissue]
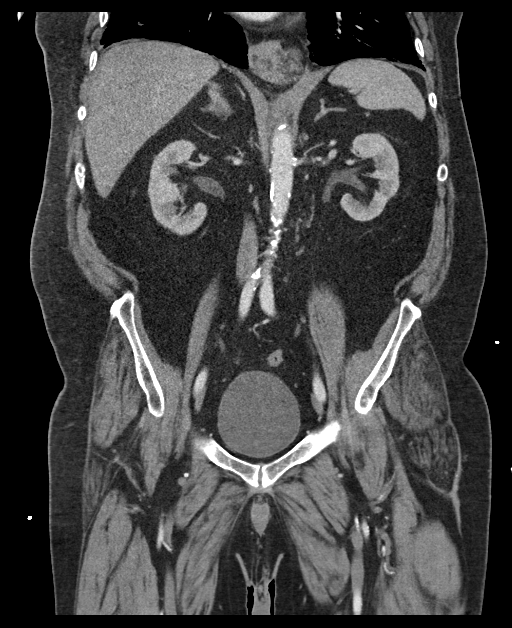

[14 of 46 positions shown; findings below may reference images not displayed]

RADIATION DOSE REDUCTION: This exam was performed according to the
departmental dose-optimization program which includes automated
exposure control, adjustment of the mA and/or kV according to
patient size and/or use of iterative reconstruction technique.

CONTRAST:  100mL OMNIPAQUE IOHEXOL 300 MG/ML  SOLN
FINDINGS: Lower chest: There is bronchial wall thickening seen within the
visualized lung bases, compatible with airway inflammation, with
associated peribronchial ground-glass pulmonary infiltrates, likely
infectious or inflammatory. Trace right pleural effusion. Pacemaker
leads are seen within the right heart. Atrial septal occluder device
is partially visualized. Cardiac size is within normal limits.
Moderate hiatal hernia.

Hepatobiliary: Mild hepatic steatosis. No enhancing intrahepatic
mass. No intra or extrahepatic biliary ductal dilation.
Cholecystectomy has been performed.

Pancreas: Unremarkable. No pancreatic ductal dilatation or
surrounding inflammatory changes.

Spleen: Unremarkable

Adrenals/Urinary Tract: The kidneys are normal in position.
Bilateral renal cortical atrophy noted. Punctate nonobstructing
calculi are noted within the lower pole of the kidneys bilaterally
measuring up to 3 mm. No ureteral calculi. No hydronephrosis.
Numerous simple and hyperdense cortical cysts are seen bilaterally,
stable since prior examination and better assessed on prior renal
sonogram. No follow-up imaging is recommended for these lesions. The
bladder is unremarkable.

Stomach/Bowel: Appendix absent. Stomach, small bowel, and large
bowel are otherwise unremarkable. No evidence of obstruction or
focal inflammation. No free intraperitoneal gas or fluid.

Vascular/Lymphatic: Moderate aortoiliac atherosclerotic
calcification. No aortic aneurysm. There is a high-grade (greater
than 90%) stenosis of the celiac axis at its origin. The superior
mesenteric artery is widely patent at its origin. The inferior
mesenteric artery demonstrates an estimated 50% stenosis at its
origin, best seen on sagittal image # 89/7. There is
collateralization to the celiac axis distribution via the inferior
pancreaticoduodenal arcade. Prominent atherosclerotic calcification
is seen at the origin of the renal arteries bilaterally. The degree
of stenosis is not well assessed on this non arteriographic study,
however, hemodynamically significant stenoses are suspected.

No pathologic adenopathy within the abdomen and pelvis.

Reproductive: Prostate is unremarkable.

Other: No abdominal wall hernia or abnormality. No abdominopelvic
ascites.

Musculoskeletal: Osseous structures are age-appropriate. No acute
bone abnormality.
IMPRESSION: No acute intra-abdominal pathology identified. No definite
radiographic explanation for the patient's reported symptoms.

Bibasilar pulmonary infiltrates and superimposed evidence of airway
inflammation, likely infectious or inflammatory in nature. Trace
right pleural effusion.

Mild hepatic steatosis.

Bilateral renal cortical atrophy. Superimposed mild bilateral
nonobstructing nephrolithiasis. No urolithiasis.

Peripheral vascular disease with hemodynamically significant
stenoses of the celiac axis and inferior mesenteric artery as well
as suspected stenoses of the renal artery origins bilaterally. If
there is clinical evidence chronic mesenteric ischemia or
significant renal artery stenosis, CT arteriography is recommended
for further evaluation.

Aortic Atherosclerosis ([XC]-[XC]).

## 2021-08-26 IMAGING — CR DG ABDOMEN ACUTE W/ 1V CHEST
4 series · 4 of 4 positions shown · non-contrast
Comparison: [DATE], [DATE]

CLINICAL DATA: Cough, fever, abdominal pain

EXAM:
DG ABDOMEN ACUTE WITH 1 VIEW CHEST

[abdomen erect]
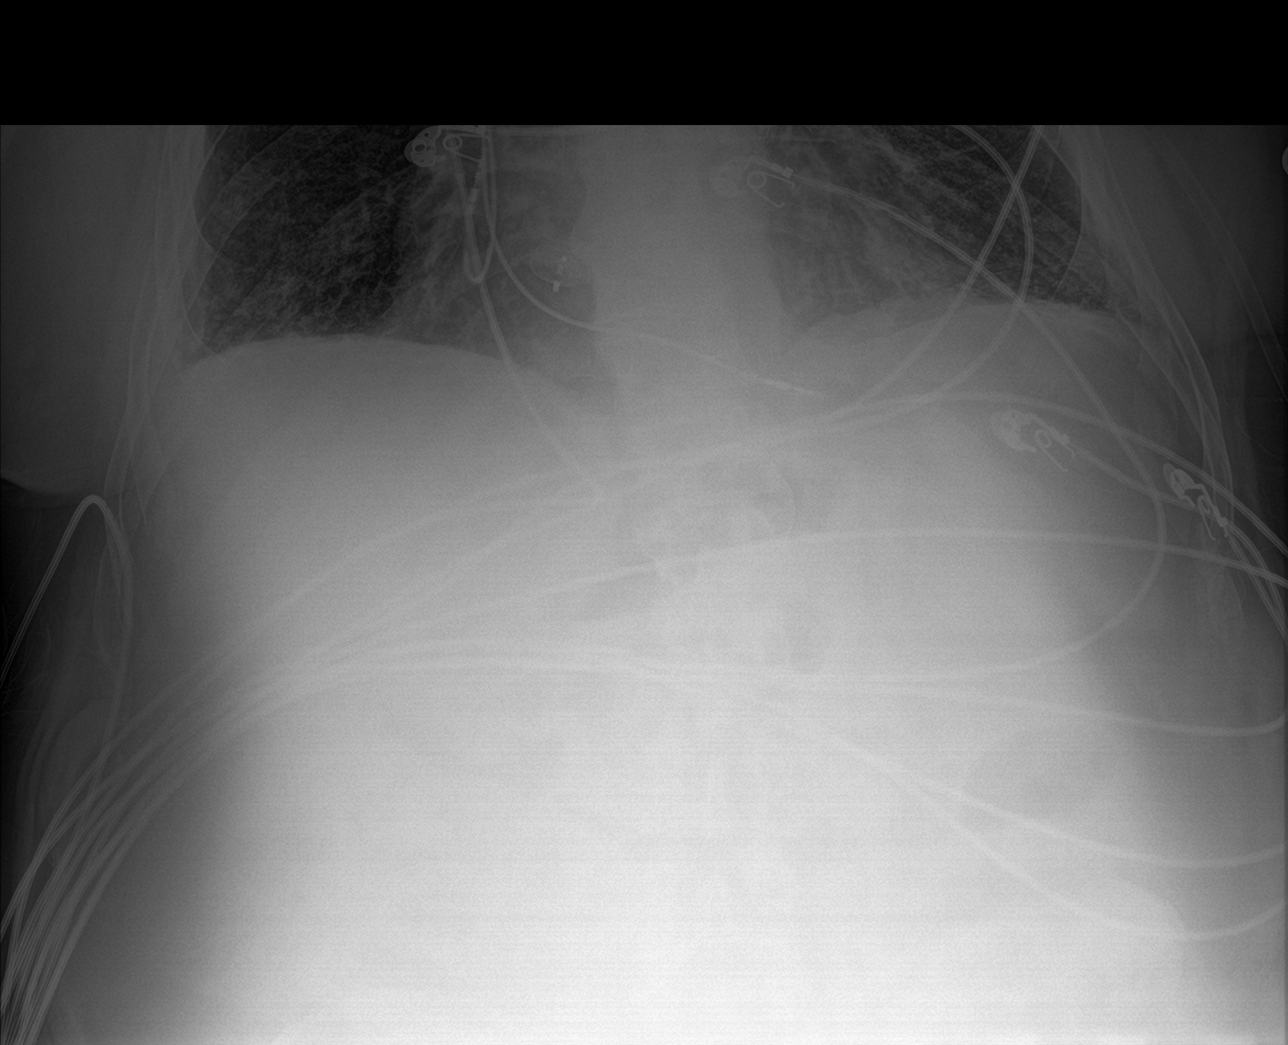

[abdomen supine (1 of 2)]
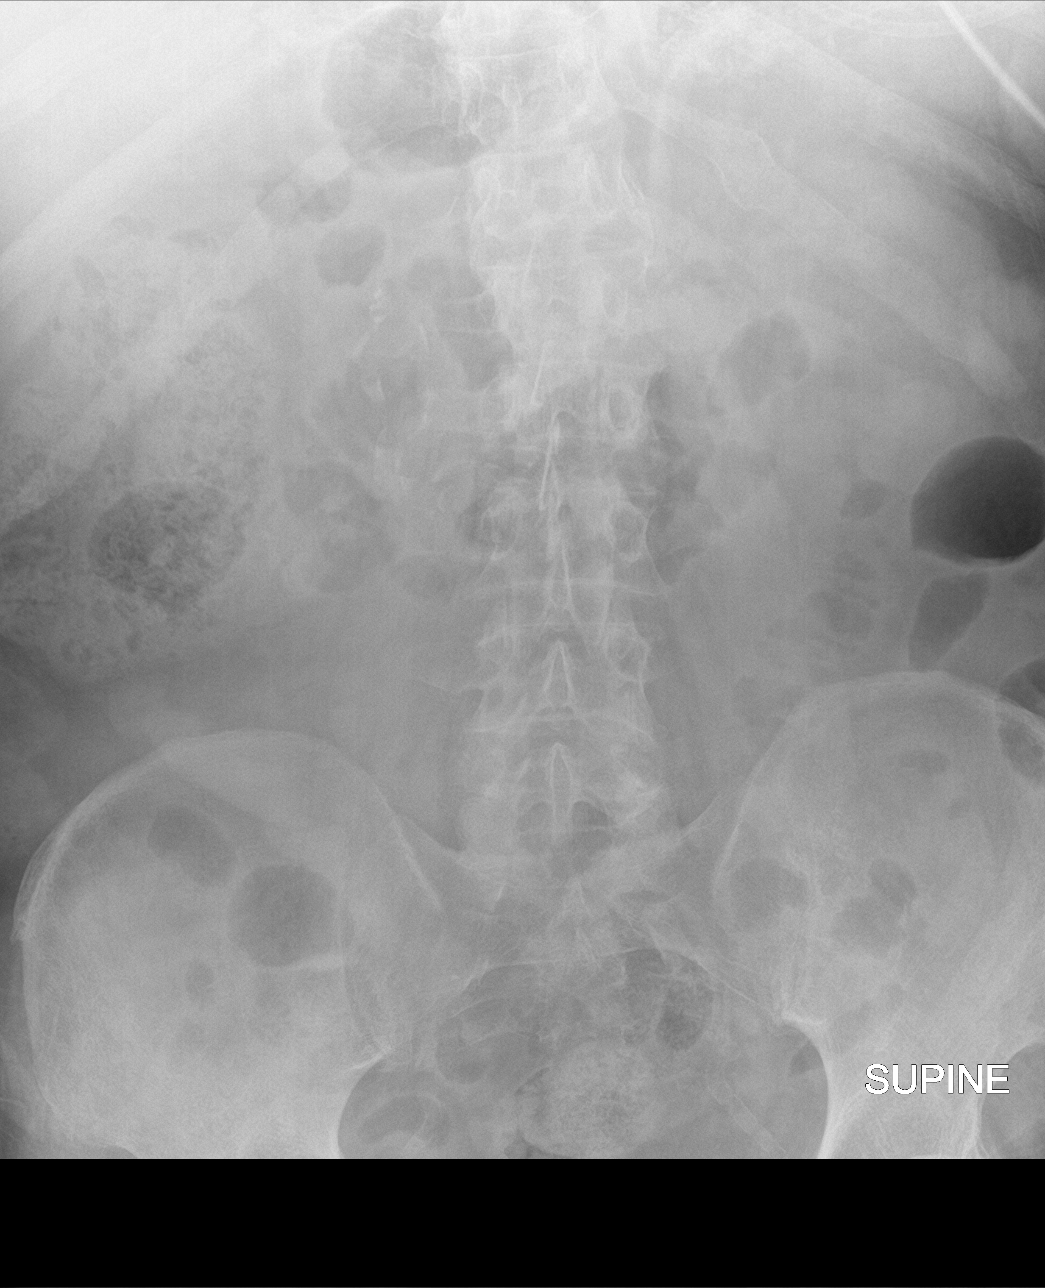

[abdomen supine (2 of 2)]
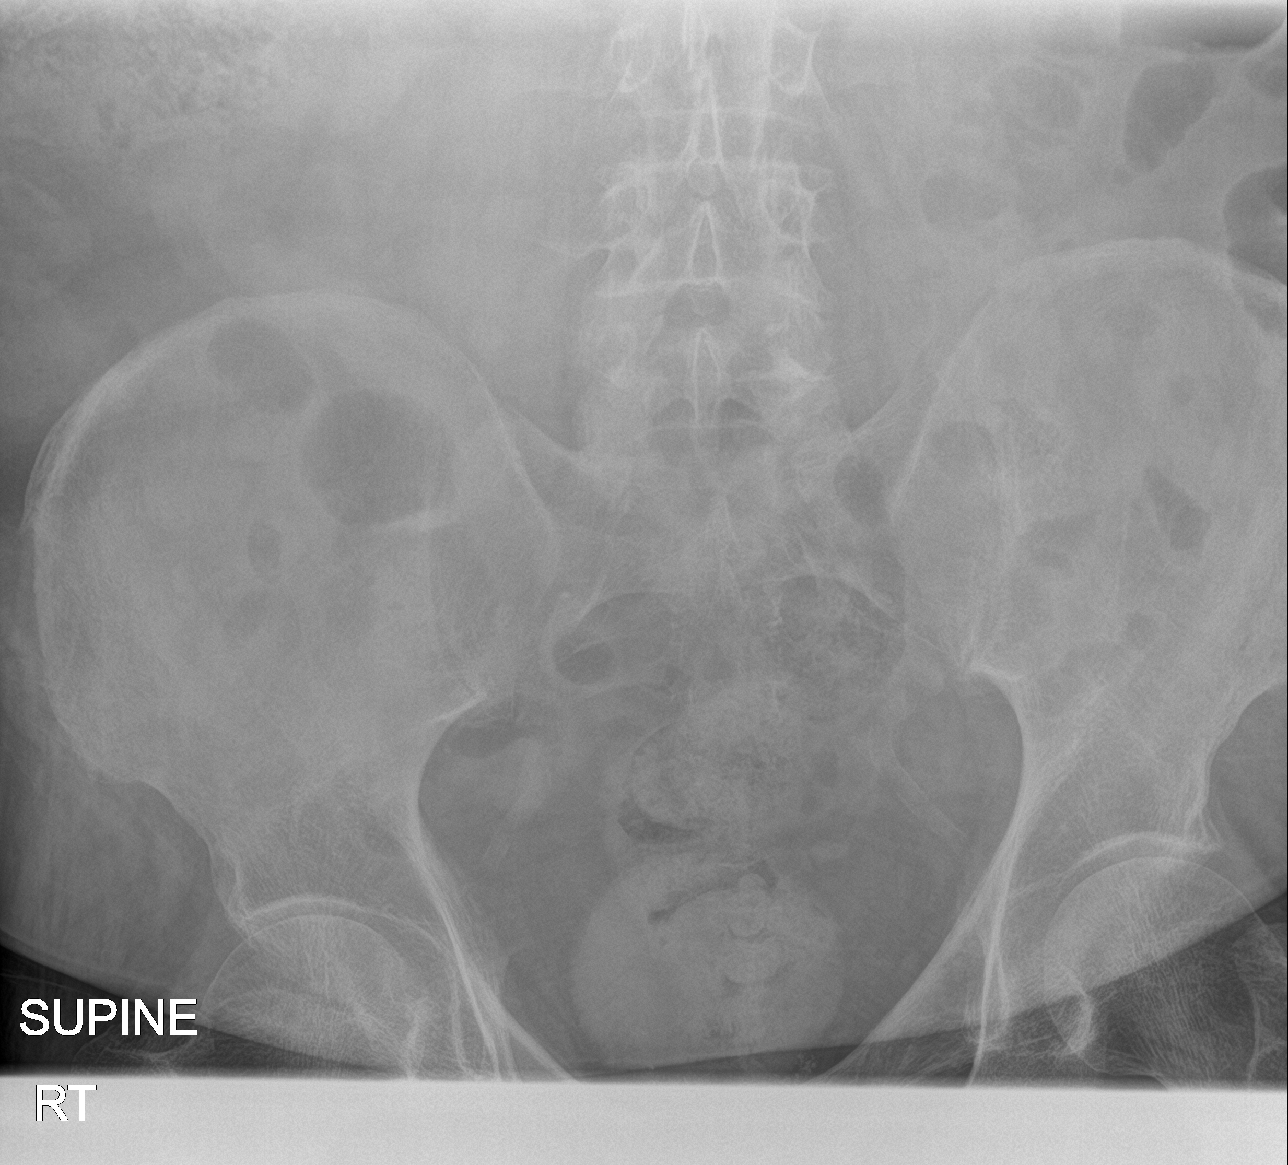

[chest ap]
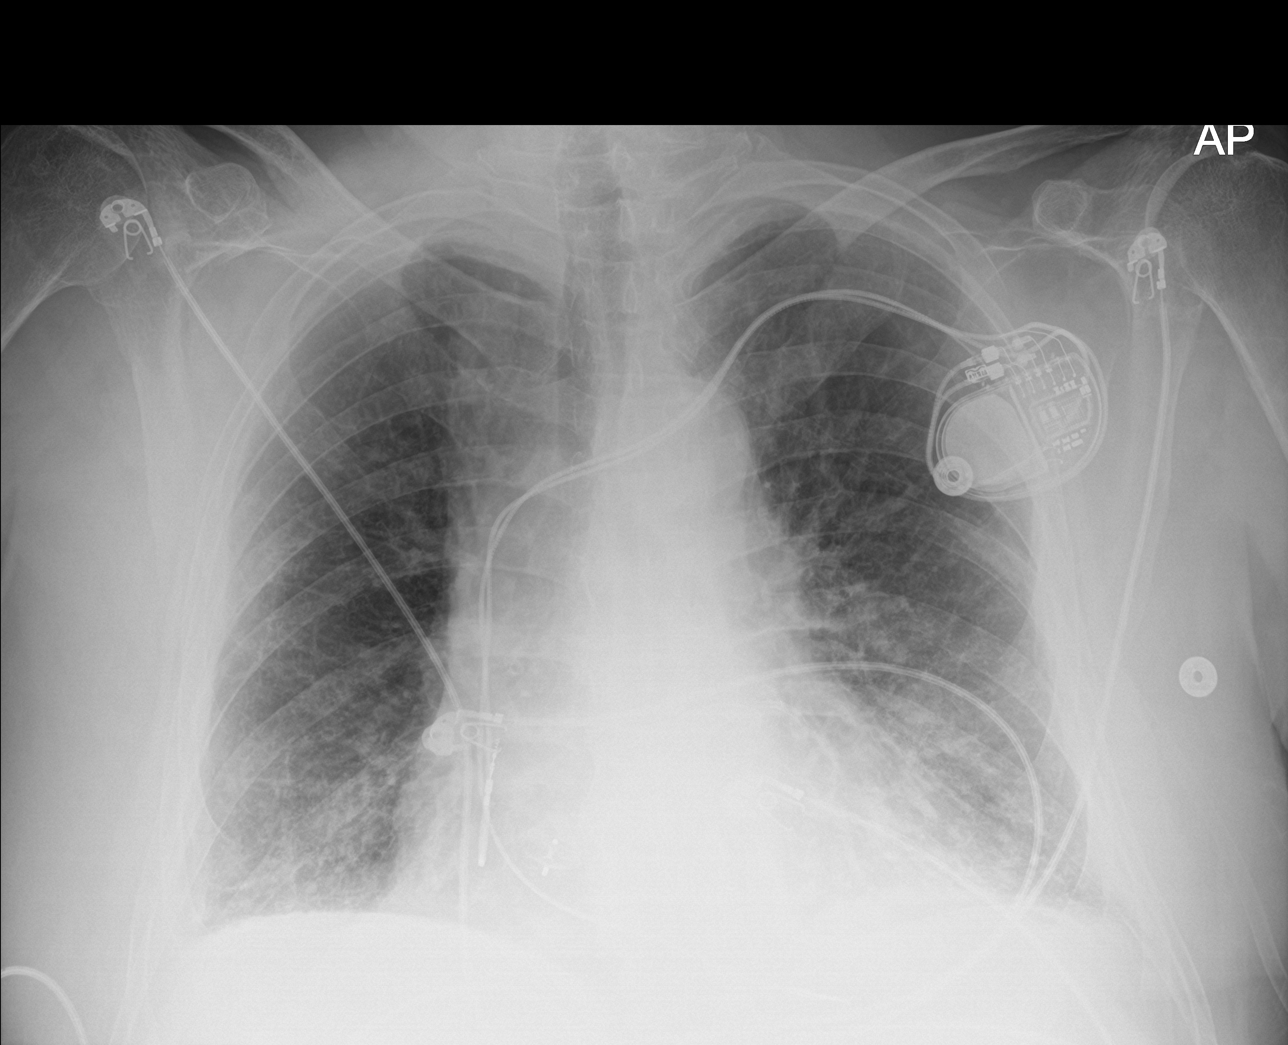

[4 of 4 positions shown; findings below may reference images not displayed]

FINDINGS: There is no evidence of dilated bowel loops or free intraperitoneal
air. No radiopaque calculi or other significant radiographic
abnormality is seen. Left-sided implanted cardiac device. Stable
cardiomegaly. Mild pulmonary vascular congestion with increasing
perihilar and bibasilar interstitial opacities. No pleural effusion
or pneumothorax.
IMPRESSION: 1. Nonobstructive bowel gas pattern.
2. Cardiomegaly with pulmonary vascular congestion and interstitial
edema. Superimposed infection would be difficult to exclude.

## 2021-08-26 MED ORDER — FLUDROCORTISONE ACETATE 0.1 MG PO TABS
0.1000 mg | ORAL_TABLET | Freq: Every day | ORAL | Status: DC
  Filled 2021-08-26: qty 1

## 2021-08-26 MED ORDER — PRIMIDONE 50 MG PO TABS
100.0000 mg | ORAL_TABLET | Freq: Two times a day (BID) | ORAL | Status: DC
  Administered 2021-08-26 – 2021-08-29 (×6): 100 mg via ORAL
  Filled 2021-08-26 (×7): qty 2

## 2021-08-26 MED ORDER — ASPIRIN EC 81 MG PO TBEC
81.0000 mg | DELAYED_RELEASE_TABLET | Freq: Every morning | ORAL | Status: DC
  Administered 2021-08-27 – 2021-08-29 (×3): 81 mg via ORAL
  Filled 2021-08-26 (×3): qty 1

## 2021-08-26 MED ORDER — POTASSIUM CITRATE ER 10 MEQ (1080 MG) PO TBCR
10.0000 meq | EXTENDED_RELEASE_TABLET | Freq: Every day | ORAL | Status: DC
  Administered 2021-08-26 – 2021-08-29 (×4): 10 meq via ORAL
  Filled 2021-08-26 (×4): qty 1

## 2021-08-26 MED ORDER — ALLOPURINOL 100 MG PO TABS
100.0000 mg | ORAL_TABLET | Freq: Every day | ORAL | Status: DC
Start: 2021-08-26 — End: 2021-08-29
  Administered 2021-08-26 – 2021-08-29 (×4): 100 mg via ORAL
  Filled 2021-08-26 (×4): qty 1

## 2021-08-26 MED ORDER — PANTOPRAZOLE SODIUM 40 MG PO TBEC
40.0000 mg | DELAYED_RELEASE_TABLET | Freq: Every evening | ORAL | Status: DC
  Administered 2021-08-26 – 2021-08-28 (×3): 40 mg via ORAL
  Filled 2021-08-26 (×4): qty 1

## 2021-08-26 MED ORDER — MEMANTINE HCL 10 MG PO TABS
10.0000 mg | ORAL_TABLET | Freq: Two times a day (BID) | ORAL | Status: DC
  Administered 2021-08-26 – 2021-08-29 (×6): 10 mg via ORAL
  Filled 2021-08-26 (×6): qty 1

## 2021-08-26 MED ORDER — GUAIFENESIN-DM 100-10 MG/5ML PO SYRP
5.0000 mL | ORAL_SOLUTION | ORAL | Status: DC | PRN
  Administered 2021-08-26: 5 mL via ORAL
  Filled 2021-08-26: qty 5

## 2021-08-26 MED ORDER — LACTATED RINGERS IV BOLUS: 1000.0000 mL | Freq: Once | INTRAVENOUS | Status: DC

## 2021-08-26 MED ORDER — IOHEXOL 300 MG/ML  SOLN
100.0000 mL | Freq: Once | INTRAMUSCULAR | Status: AC | PRN
  Administered 2021-08-26: 100 mL via INTRAVENOUS

## 2021-08-26 MED ORDER — ENOXAPARIN SODIUM 40 MG/0.4ML IJ SOSY
40.0000 mg | PREFILLED_SYRINGE | INTRAMUSCULAR | Status: DC
  Administered 2021-08-26 – 2021-08-28 (×3): 40 mg via SUBCUTANEOUS
  Filled 2021-08-26 (×4): qty 0.4

## 2021-08-26 MED ORDER — ATORVASTATIN CALCIUM 80 MG PO TABS
80.0000 mg | ORAL_TABLET | Freq: Every day | ORAL | Status: DC
  Administered 2021-08-26 – 2021-08-29 (×4): 80 mg via ORAL
  Filled 2021-08-26: qty 1
  Filled 2021-08-26: qty 2
  Filled 2021-08-26 (×2): qty 1

## 2021-08-26 MED ORDER — ACETAMINOPHEN 650 MG RE SUPP: 650.0000 mg | Freq: Four times a day (QID) | RECTAL | Status: DC | PRN

## 2021-08-26 MED ORDER — CLOPIDOGREL BISULFATE 75 MG PO TABS
75.0000 mg | ORAL_TABLET | Freq: Every day | ORAL | Status: DC
  Administered 2021-08-26 – 2021-08-29 (×4): 75 mg via ORAL
  Filled 2021-08-26 (×4): qty 1

## 2021-08-26 MED ORDER — GUAIFENESIN-DM 100-10 MG/5ML PO SYRP
15.0000 mL | ORAL_SOLUTION | ORAL | Status: DC | PRN
  Administered 2021-08-26 – 2021-08-29 (×4): 15 mL via ORAL
  Filled 2021-08-26 (×5): qty 15

## 2021-08-26 MED ORDER — SODIUM CHLORIDE 0.9 % IV SOLN
2.0000 g | Freq: Once | INTRAVENOUS | Status: AC
  Administered 2021-08-26: 2 g via INTRAVENOUS
  Filled 2021-08-26: qty 12.5

## 2021-08-26 MED ORDER — FERROUS SULFATE 325 (65 FE) MG PO TABS
325.0000 mg | ORAL_TABLET | Freq: Two times a day (BID) | ORAL | Status: DC
  Administered 2021-08-26 – 2021-08-29 (×6): 325 mg via ORAL
  Filled 2021-08-26 (×6): qty 1

## 2021-08-26 MED ORDER — ESCITALOPRAM OXALATE 10 MG PO TABS
30.0000 mg | ORAL_TABLET | Freq: Every day | ORAL | Status: DC
  Administered 2021-08-26 – 2021-08-29 (×4): 30 mg via ORAL
  Filled 2021-08-26 (×4): qty 3

## 2021-08-26 MED ORDER — MAGNESIUM OXIDE -MG SUPPLEMENT 400 (240 MG) MG PO TABS
400.0000 mg | ORAL_TABLET | Freq: Every day | ORAL | Status: DC
  Administered 2021-08-26 – 2021-08-27 (×2): 400 mg via ORAL
  Filled 2021-08-26 (×4): qty 1

## 2021-08-26 MED ORDER — SODIUM CHLORIDE 0.9% FLUSH: 3.0000 mL | INTRAVENOUS | Status: DC | PRN

## 2021-08-26 MED ORDER — LATANOPROST 0.005 % OP SOLN
1.0000 [drp] | Freq: Every day | OPHTHALMIC | Status: DC
  Administered 2021-08-26 – 2021-08-28 (×3): 1 [drp] via OPHTHALMIC
  Filled 2021-08-26: qty 2.5

## 2021-08-26 MED ORDER — RANOLAZINE ER 500 MG PO TB12
1000.0000 mg | ORAL_TABLET | Freq: Every day | ORAL | Status: DC
  Administered 2021-08-26: 1000 mg via ORAL
  Filled 2021-08-26: qty 2

## 2021-08-26 MED ORDER — LACTATED RINGERS IV SOLN: INTRAVENOUS | Status: DC

## 2021-08-26 MED ORDER — ALBUTEROL SULFATE (2.5 MG/3ML) 0.083% IN NEBU
2.5000 mg | INHALATION_SOLUTION | RESPIRATORY_TRACT | Status: DC | PRN
  Administered 2021-08-26: 2.5 mg via RESPIRATORY_TRACT
  Filled 2021-08-26: qty 3

## 2021-08-26 MED ORDER — MIDODRINE HCL 5 MG PO TABS
5.0000 mg | ORAL_TABLET | Freq: Three times a day (TID) | ORAL | Status: DC
  Administered 2021-08-27 – 2021-08-29 (×7): 5 mg via ORAL
  Filled 2021-08-26 (×7): qty 1

## 2021-08-26 MED ORDER — ROPINIROLE HCL ER 4 MG PO TB24
4.0000 mg | ORAL_TABLET | Freq: Every day | ORAL | Status: DC
  Administered 2021-08-26 – 2021-08-28 (×3): 4 mg via ORAL
  Filled 2021-08-26 (×4): qty 1

## 2021-08-26 MED ORDER — QUETIAPINE FUMARATE 100 MG PO TABS
100.0000 mg | ORAL_TABLET | Freq: Every day | ORAL | Status: DC
  Administered 2021-08-26 – 2021-08-28 (×3): 100 mg via ORAL
  Filled 2021-08-26 (×3): qty 1

## 2021-08-26 MED ORDER — ACETAMINOPHEN 325 MG PO TABS: 650.0000 mg | ORAL_TABLET | Freq: Four times a day (QID) | ORAL | Status: DC | PRN

## 2021-08-26 MED ORDER — FUROSEMIDE 10 MG/ML IJ SOLN
20.0000 mg | Freq: Once | INTRAMUSCULAR | Status: AC
  Administered 2021-08-26: 20 mg via INTRAVENOUS
  Filled 2021-08-26: qty 2

## 2021-08-26 MED ORDER — IPRATROPIUM-ALBUTEROL 0.5-2.5 (3) MG/3ML IN SOLN
3.0000 mL | Freq: Once | RESPIRATORY_TRACT | Status: AC
  Administered 2021-08-26: 3 mL via RESPIRATORY_TRACT
  Filled 2021-08-26: qty 3

## 2021-08-26 MED ORDER — SODIUM CHLORIDE 0.9 % IV BOLUS
1000.0000 mL | Freq: Once | INTRAVENOUS | Status: AC
  Administered 2021-08-26: 1000 mL via INTRAVENOUS

## 2021-08-26 MED ORDER — IPRATROPIUM-ALBUTEROL 0.5-2.5 (3) MG/3ML IN SOLN
3.0000 mL | Freq: Four times a day (QID) | RESPIRATORY_TRACT | Status: AC
Start: 2021-08-26 — End: 2021-08-27
  Administered 2021-08-26 – 2021-08-27 (×3): 3 mL via RESPIRATORY_TRACT
  Filled 2021-08-26 (×4): qty 3

## 2021-08-26 MED ORDER — SODIUM CHLORIDE 0.9% FLUSH
3.0000 mL | Freq: Two times a day (BID) | INTRAVENOUS | Status: DC
  Administered 2021-08-27 – 2021-08-29 (×4): 3 mL via INTRAVENOUS

## 2021-08-26 MED ORDER — ACETAMINOPHEN 325 MG PO TABS
650.0000 mg | ORAL_TABLET | Freq: Once | ORAL | Status: AC
  Administered 2021-08-26: 650 mg via ORAL
  Filled 2021-08-26: qty 2

## 2021-08-26 MED ORDER — SODIUM CHLORIDE 0.9 % IV SOLN
250.0000 mL | INTRAVENOUS | Status: DC | PRN
  Administered 2021-08-26 – 2021-08-27 (×2): 250 mL via INTRAVENOUS

## 2021-08-26 MED ORDER — SODIUM CHLORIDE 0.9 % IV SOLN
2.0000 g | Freq: Three times a day (TID) | INTRAVENOUS | Status: DC
  Administered 2021-08-26 – 2021-08-27 (×2): 2 g via INTRAVENOUS
  Filled 2021-08-26 (×2): qty 12.5

## 2021-08-26 MED ORDER — MOMETASONE FURO-FORMOTEROL FUM 200-5 MCG/ACT IN AERO
2.0000 | INHALATION_SPRAY | Freq: Two times a day (BID) | RESPIRATORY_TRACT | Status: DC
  Administered 2021-08-27 – 2021-08-29 (×4): 2 via RESPIRATORY_TRACT
  Filled 2021-08-26: qty 8.8

## 2021-08-26 NOTE — Assessment & Plan Note (Addendum)
Hypertensive meds held and lasix stopped outpatient due to severe orthostatic hypotension with falls  ?Cardiology recently added midodrine 36m TID and fludrocortisone at .124mon Monday.  ?Blood pressures have been acceptable ?Check orthostatics  ?Will continue midodrine for now, holding fludrocortisone with anger/changes in mental status. Has only had 2 doses ?

## 2021-08-26 NOTE — Sepsis Progress Note (Signed)
eLink is following this Code Sepsis. °

## 2021-08-26 NOTE — Assessment & Plan Note (Signed)
In NSR, no AC but has watchman ?Continue telemetry  ? ?

## 2021-08-26 NOTE — ED Notes (Signed)
2 IV attemptes unsuccessful ?

## 2021-08-26 NOTE — Assessment & Plan Note (Signed)
Stable, continue to monitor  ?

## 2021-08-26 NOTE — Assessment & Plan Note (Addendum)
71 year old male presenting to ED with complaints of choking after medication/cough, fever and intermittent confusion found to be septic with fever to 102.7, tachycardia and tachypnea ?-obs to telemetry  ?-started on cefepime. Source thought to be lungs with history of cough/choking after eating ?-aspiration precautions/NPO and SLP eval-recommend dys 3 diet/thin liquids. MBSS tomorrow  ?-has no WBC, normal lactic acid ?-check RVP and PCT ?-urinary antigens ?-blood cultures  ?-covid/flu negative ?-UA pending ?-will continue cefepime with structural lung disease and hx of frequent abx (discharged on levaquin in April)  ?-IS to bedside ?-robitussin DM ?-duonebs scheduled ? ?

## 2021-08-26 NOTE — Assessment & Plan Note (Addendum)
Presenting with tachypnea, dyspnea and new ? requirement of 2-4L of oxygen. No recorded hypoxia. Discussed with EDP and no mention of hypoxia to him.  Wife reports oxygen 88% with EMS and oxygen placed. On 2L in room and at 96-98%. Will have nurse wean and see how he does to determine if true  Hypoxia  ?-? If any pulmonary HTN with possible ILD and some CHF (3 pound weight gain, dyspneic, CXR showing edema, JVP on exam. BNP pending) ?-echo has been ordered ?-home lasix held by cardiology due to orthostatic hypotension issues, giving 73m IV dose and watching blood pressure closely  ?-strict I/O and daily weights  ?-continue to treat for possible aspiration/pneumonia and needs follow up with pulm for ILD work up.  ?

## 2021-08-26 NOTE — ED Triage Notes (Signed)
Pt BIB [facility] from home, pt has had reoccuring pna since December, last was early April. Since then pt has had dizziness, weakness, increased falls, SOB, fevers. Pt has a hx of dementia but confusion and agitation has worsened. Pt fell yesterday and refused to be seen, bruise to leg. Given albuterol treatment at home x2. Pt tachypneic on arrival. Given "double dose of extra strength" tylenol at home by wife.  ?

## 2021-08-26 NOTE — ED Provider Notes (Addendum)
?[place] ?Provider Note ? ? ?CSN: [id number] ?Arrival date & time: 08/26/21  1151 ? ?  ? ?History ? ?Chief Complaint  ?Patient presents with  ? Altered Mental Status  ? ? ?Mark Thomas is a 71 y.o. male. ? ? ?Altered Mental Status ?Associated symptoms: weakness   ?Patient brought in with fever confusion and some weakness.  Has had recent admissions for pneumonia.  Fever 102.7 upon arrival.  History of dementia.  Has had recent admission to the hospital for pneumonia complicated by hypotension.  Is on midodrine.  Was post to be cardiology in follow-up but does not been able to get in due to feeling sick now.  Blood pressure got somewhat better.  Did have a fall the other day.  Also had episode of choking.  Reportedly choked on some of his medications.  Later had difficulty getting up.  Feels lightheaded with standing recently. ?  ?Past Medical History:  ?Diagnosis Date  ? Anxiety   ? Coronary artery disease   ? Depression   ? Essential tremor   ? GERD (gastroesophageal reflux disease)   ? High cholesterol   ? Hypertension   ? Myocardial infarct [facility])   ? Orthostatic hypotension   ? Stroke [facility])   ? Vascular dementia ([place])   ? ? ? ?Home Medications ?Prior to Admission medications   ?Medication Sig Start Date End Date Taking? Authorizing Provider  ?albuterol (PROVENTIL) (2.5 MG/3ML) 0.083% nebulizer solution Take 3 mLs (2.5 mg total) by nebulization every 6 (six) hours as needed for wheezing or shortness of breath. 07/15/21   Luetta Nutting, DO  ?albuterol (VENTOLIN HFA) 108 (90 Base) MCG/ACT inhaler Inhale 2 puffs into the lungs every 6 (six) hours as needed for wheezing or shortness of breath.    [provider]  ?allopurinol (ZYLOPRIM) 100 MG tablet Take 100 mg by mouth daily. 09/22/20   [provider]  ?aspirin 81 MG EC tablet Take 1 tablet by mouth daily. 05/05/14   [provider]  ?atorvastatin (LIPITOR) 80 MG tablet Take 80 mg by mouth daily.  11/18/20   [provider]  ?b complex vitamins capsule Take 1 capsule by mouth daily.    [provider]  ?Cholecalciferol (VITAMIN D-3) 125 MCG (5000 UT) TABS Take 5,000 Units by mouth daily.    [provider]  ?clopidogrel (PLAVIX) 75 MG tablet Take 75 mg by mouth daily. 11/18/20   [provider]  ?escitalopram (LEXAPRO) 20 MG tablet Take 1.5 tablets (30 mg total) by mouth daily. 08/23/21   Merian Capron, MD  ?ferrous sulfate 325 (65 FE) MG EC tablet Take 1 tablet (325 mg total) by mouth in the morning and at bedtime. 03/19/21 08/30/21  Luetta Nutting, DO  ?fludrocortisone (FLORINEF) 0.1 MG tablet Take 1 tablet (0.1 mg total) by mouth daily. 08/23/21   Revankar, Reita Cliche, MD  ?HYDROcodone-acetaminophen (NORCO/VICODIN) 5-325 MG tablet Take 1 tablet by mouth every 6 (six) hours as needed for moderate pain. As needed at night 08/17/21   Luetta Nutting, DO  ?latanoprost (XALATAN) 0.005 % ophthalmic solution Place 1 drop into both eyes at bedtime. 12/07/20   [provider]  ?magnesium oxide (MAG-OX) 400 MG tablet Take 400 mg by mouth daily.    [provider]  ?memantine (NAMENDA) 10 MG tablet Take 1 tablet (10 mg total) by mouth 2 (two) times daily. 05/11/21 08/26/21  Alric Ran, MD  ?midodrine (PROAMATINE) 5 MG tablet Take 1 tablet (5  mg total) by mouth 3 (three) times daily with meals. 08/16/21   Park Liter, MD  ?Mometasone Furo-Formoterol Fum (DULERA IN) Inhale 2 puffs into the lungs 2 (two) times daily.    [provider]  ?nitroGLYCERIN (NITROLINGUAL) 0.4 MG/SPRAY spray Place 1 spray under the tongue every 5 (five) minutes x 3 doses as needed for chest pain. 05/05/14   [provider]  ?pantoprazole (PROTONIX) 40 MG tablet Take 1 tablet (40 mg total) by mouth every evening. 07/07/21   Luetta Nutting, DO  ?potassium citrate (UROCIT-K) 10 MEQ (1080 MG) SR tablet Take 10 mEq by mouth daily. 09/22/20   [provider]  ?primidone  (MYSOLINE) 50 MG tablet Take 2 tablets (100 mg total) by mouth 2 (two) times daily. 08/24/21 11/22/21  Alric Ran, MD  ?QUEtiapine (SEROQUEL) 50 MG tablet Take 2 tablets (100 mg total) by mouth at bedtime. 07/28/21 08/27/21  Merian Capron, MD  ?rOPINIRole (REQUIP XL) 4 MG 24 hr tablet Take 1 tablet (4 mg total) by mouth at bedtime. 05/27/21 08/25/21  Alric Ran, MD  ?Dellis Anes 160-4.5 MCG/ACT inhaler Inhale 2 puffs into the lungs 2 (two) times daily. 08/24/21   [provider]  ?triamcinolone ointment (KENALOG) 0.1 % Apply 1 application. topically as needed (rash). 01/21/21   [provider]  ?   ? ?Allergies    ?Dilaudid [hydromorphone], Bactrim [sulfamethoxazole-trimethoprim], Benadryl [diphenhydramine], Phenobarbital, and Penicillins   ? ?Review of Systems   ?Review of Systems  ?Constitutional:  Positive for appetite change.  ?Respiratory:  Positive for cough and shortness of breath.   ?Neurological:  Positive for weakness.  ? ?Physical Exam ?Updated Vital Signs ?BP (!) 109/58   Pulse 85   Temp (!) 100.7 ?F (38.2 ?C) (Oral)   Resp (!) 23   Ht 6' 1" (1.854 m)   Wt 111.1 kg   SpO2 97%   BMI 32.32 kg/m?  ?Physical Exam ?Vitals and nursing note reviewed.  ?HENT:  ?   Head: Normocephalic.  ?Eyes:  ?   Pupils: Pupils are equal, round, and reactive to light.  ?Cardiovascular:  ?   Rate and Rhythm: Normal rate.  ?Pulmonary:  ?   Comments: Harsh breath sounds with wheezes. ?Abdominal:  ?   Tenderness: There is abdominal tenderness.  ?   Comments: Diffuse tenderness out rebound or guarding.  No ecchymosis.  ?Musculoskeletal:     ?   General: No tenderness.  ?Skin: ?   General: Skin is warm.  ?Neurological:  ?   Mental Status: He is alert.  ?   Comments: Patient is awake and answers questions, but somewhat confused.  ? ? ?ED Results / Procedures / Treatments   ?Labs ?(all labs ordered are listed, but only abnormal results are displayed) ?Labs Reviewed  ?COMPREHENSIVE METABOLIC PANEL - Abnormal;  Notable for the following components:  ?    Result Value  ? Sodium 134 (*)   ? Glucose, Bld 163 (*)   ? Calcium 8.4 (*)   ? Total Protein 6.2 (*)   ? Albumin 3.0 (*)   ? All other components within normal limits  ?CBC WITH DIFFERENTIAL/PLATELET - Abnormal; Notable for the following components:  ? RBC 3.36 (*)   ? Hemoglobin 11.6 (*)   ? HCT 34.6 (*)   ? MCV 103.0 (*)   ? MCH 34.5 (*)   ? Lymphs Abs 0.5 (*)   ? All other components within normal limits  ?RESP PANEL BY RT-PCR (FLU A&B, COVID) [id number]  ?  CULTURE, BLOOD (ROUTINE X 2)  ?CULTURE, BLOOD (ROUTINE X 2)  ?LACTIC ACID, PLASMA  ?PROTIME-INR  ?APTT  ?LIPASE, BLOOD  ?URINALYSIS, ROUTINE W REFLEX MICROSCOPIC  ?BRAIN NATRIURETIC PEPTIDE  ? ? ?EKG ?None ? ?Radiology ?DG Abdomen Acute W/Chest ? ?Result Date: 08/26/2021 ?CLINICAL DATA:  Cough, fever, abdominal pain EXAM: DG ABDOMEN ACUTE WITH 1 VIEW CHEST COMPARISON:  08/04/2021, 08/06/2021 FINDINGS: There is no evidence of dilated bowel loops or free intraperitoneal air. No radiopaque calculi or other significant radiographic abnormality is seen. Left-sided implanted cardiac device. Stable cardiomegaly. Mild pulmonary vascular congestion with increasing perihilar and bibasilar interstitial opacities. No pleural effusion or pneumothorax. IMPRESSION: 1. Nonobstructive bowel gas pattern. 2. Cardiomegaly with pulmonary vascular congestion and interstitial edema. Superimposed infection would be difficult to exclude. Electronically Signed   By: Davina Poke D.O.   On: 08/26/2021 13:20   ? ?Procedures ?Angiocath insertion ? ?Date/Time: 08/26/2021 4:27 PM ?Performed by: Davonna Belling, MD ?Authorized by: Davonna Belling, MD  ?Consent: Verbal consent obtained. Written consent not obtained. ?Risks and benefits: risks, benefits and alternatives were discussed ?Consent given by: patient ?Patient understanding: patient states understanding of the procedure being performed ?Patient identity confirmed: verbally with  patient ?Preparation: Patient was prepped and draped in the usual sterile fashion. ?Local anesthesia used: no ? ?Anesthesia: ?Local anesthesia used: no ? ?Sedation: ?Patient sedated: no ? ?Patient tolerance: patient tolerated th

## 2021-08-26 NOTE — H&P (Signed)
?History and Physical  ? ? ?Patient: Mark Thomas DOB: [birth year] ?DOA: 08/26/2021 ?DOS: the patient was seen and examined on 08/26/2021 ?PCP: Luetta Nutting, DO  ?Patient coming from: Home - lives with wife. Uses walker to get around has been in [facility] since hospital d/c on 4/10 due to orthostatic hypotension.  ? ? ?Chief Complaint: fever/cough/intermittent confusion  ? ?HPI: Mark Thomas is a 71 y.o. male with medical history significant of HTN, CAD s/p stent to LAD, CVA, MVP, PAF s/p watchman device, vascular dementia, CKD stage 3, HLD, hx of orthostatic hypotension on midodrine who presented to ED with complaints of coughing/fever and intermittent confusion. On Monday he got choked taking his medicine. He coughed up a pill after this. He continued to cough throughout the day. He had another episode of choking on gatorade later in the day. He has continued to cough non stop and his fatigue has gotten worse.  His lightheadedness and dizziness have not gone away since he was in hospital.  When he saw the cardiologist on Monday he was started on fludrocortisone and is on  midodrine which was increased recently as well. Lasix and BP medications stopped. His fever started last night to 102. His cough is wet, but nonproductive. His wife also reports he has seen more confused over the last week and more argumentative and not like himself. He also fell yesterday and hit his head when trying to walk with his walker.   ? ?Had admit on 4/5-4/10 at [facility] for CAP. CTA chest done showed no PE, but had tree-in -bud and patchy ground glass opacity in bilateral upper lobe compatible with infection/inflammation. Possible post-covid complication. Has been on multiple rounds of antibiotics and steroids since he had covid in 04/2021 with minimal improvement. PCCM consulted who recommended outpatient f/u for HRCT and further w/u for ILD. Has not seen pulmonology yet.  ? ?Denies vision changes/headaches, chest pain or  palpitations, +abdominal pain since fall a few months ago,  no N/V/D, dysuria or leg swelling. No orthopnea. Wife reports a 3 pound weight gain.  ? ?Wife also reports he has had an issue with pain medication in the past and has been asking for norco excessively since he got some from his pcp.  ? ?ER Course:  vitals: temp: 102.7, bp: 154/104, HR: 104, RR: 35, oxygen: 97% on 4L [place] ?Pertinent labs: hgb 11.6, flu/covid negative ?CXR: cardiomegaly with pulmonary vascular congestion and interstitial edema. Superimposed infection difficult to exclude.  ?In ED: cultures/urine obtained. Code sepsis activated. CT abdomen pelvis ordered. Given tylenol, 1L NS bolus, duoneb, cefepime.  ? ? ?Review of Systems: As mentioned in the history of present illness. All other systems reviewed and are negative. ?Past Medical History:  ?Diagnosis Date  ? Anxiety   ? Coronary artery disease   ? Depression   ? Essential tremor   ? GERD (gastroesophageal reflux disease)   ? High cholesterol   ? Hypertension   ? Myocardial infarct [facility])   ? Orthostatic hypotension   ? Stroke [facility])   ? Vascular dementia ([place])   ? ?Past Surgical History:  ?Procedure Laterality Date  ? APPENDECTOMY    ? CARDIAC SURGERY    ? CHOLECYSTECTOMY    ? HERNIA REPAIR    ? NASAL SINUS SURGERY    ? SHOULDER SURGERY    ? ?Social History:  reports that he has never smoked. He has never been exposed to tobacco smoke. He has never used smokeless tobacco. He reports  that he does not currently use alcohol. He reports that he does not use drugs. ? ?Allergies  ?Allergen Reactions  ? Dilaudid [Hydromorphone] Other (See Comments)  ?  Respiratory Arrest!!!!  ? Bactrim [Sulfamethoxazole-Trimethoprim] Rash  ? Benadryl [Diphenhydramine] Other (See Comments)  ?  Muscle spasms  ? Phenobarbital Other (See Comments)  ?  From childhood- reaction not recalled at this time  ? Penicillins Other (See Comments)  ?  From childhood- reaction not recalled at this time  ? ? ?Family History  ?Problem  Relation Age of Onset  ? Hypertension Mother   ? Stroke Mother   ? Stroke Father   ? Hypertension Father   ? ? ?Prior to Admission medications   ?Medication Sig Start Date End Date Taking? Authorizing Provider  ?allopurinol (ZYLOPRIM) 100 MG tablet Take 100 mg by mouth daily. 09/22/20  Yes [provider]  ?aspirin 81 MG EC tablet Take 81 mg by mouth in the morning. 05/05/14  Yes [provider]  ?albuterol (PROVENTIL) (2.5 MG/3ML) 0.083% nebulizer solution Take 3 mLs (2.5 mg total) by nebulization every 6 (six) hours as needed for wheezing or shortness of breath. 07/15/21   Luetta Nutting, DO  ?albuterol (VENTOLIN HFA) 108 (90 Base) MCG/ACT inhaler Inhale 2 puffs into the lungs every 6 (six) hours as needed for wheezing or shortness of breath.    [provider]  ?atorvastatin (LIPITOR) 80 MG tablet Take 80 mg by mouth daily. 11/18/20   [provider]  ?b complex vitamins capsule Take 1 capsule by mouth daily.    [provider]  ?clopidogrel (PLAVIX) 75 MG tablet Take 75 mg by mouth daily. 11/18/20   [provider]  ?escitalopram (LEXAPRO) 20 MG tablet Take 1.5 tablets (30 mg total) by mouth daily. 08/23/21   Merian Capron, MD  ?ferrous sulfate 325 (65 FE) MG EC tablet Take 1 tablet (325 mg total) by mouth in the morning and at bedtime. 03/19/21 08/30/21  Luetta Nutting, DO  ?fludrocortisone (FLORINEF) 0.1 MG tablet Take 1 tablet (0.1 mg total) by mouth daily. 08/23/21   Revankar, Reita Cliche, MD  ?HYDROcodone-acetaminophen (NORCO/VICODIN) 5-325 MG tablet Take 1 tablet by mouth every 6 (six) hours as needed for moderate pain. As needed at night 08/17/21   Luetta Nutting, DO  ?latanoprost (XALATAN) 0.005 % ophthalmic solution Place 1 drop into both eyes at bedtime. 12/07/20   [provider]  ?magnesium oxide (MAG-OX) 400 MG tablet Take 400 mg by mouth daily.    [provider]  ?memantine (NAMENDA) 10 MG tablet Take 1 tablet (10 mg total) by mouth 2 (two)  times daily. 05/11/21 08/26/21  Alric Ran, MD  ?midodrine (PROAMATINE) 5 MG tablet Take 1 tablet (5 mg total) by mouth 3 (three) times daily with meals. 08/16/21   Park Liter, MD  ?Mometasone Furo-Formoterol Fum (DULERA IN) Inhale 2 puffs into the lungs 2 (two) times daily.    [provider]  ?nitroGLYCERIN (NITROLINGUAL) 0.4 MG/SPRAY spray Place 1 spray under the tongue every 5 (five) minutes x 3 doses as needed for chest pain. 05/05/14   [provider]  ?pantoprazole (PROTONIX) 40 MG tablet Take 1 tablet (40 mg total) by mouth every evening. 07/07/21   Luetta Nutting, DO  ?potassium citrate (UROCIT-K) 10 MEQ (1080 MG) SR tablet Take 10 mEq by mouth daily. 09/22/20   [provider]  ?primidone (MYSOLINE) 50 MG tablet Take 2 tablets (100 mg total) by mouth 2 (two) times daily. 08/24/21  11/22/21  Alric Ran, MD  ?QUEtiapine (SEROQUEL) 50 MG tablet Take 2 tablets (100 mg total) by mouth at bedtime. 07/28/21 08/27/21  Merian Capron, MD  ?rOPINIRole (REQUIP XL) 4 MG 24 hr tablet Take 1 tablet (4 mg total) by mouth at bedtime. 05/27/21 08/25/21  Alric Ran, MD  ?Dellis Anes 160-4.5 MCG/ACT inhaler Inhale 2 puffs into the lungs 2 (two) times daily. 08/24/21   [provider]  ?triamcinolone ointment (KENALOG) 0.1 % Apply 1 application. topically as needed (rash). 01/21/21   [provider]  ? ? ?Physical Exam: ?Vitals:  ? 08/26/21 1330 08/26/21 1408 08/26/21 1430 08/26/21 1600  ?BP: 120/87  (!) 109/58 (!) 134/93  ?Pulse: (!) 104  85 90  ?Resp: (!) 30  (!) 23 (!) 27  ?Temp:  (!) 100.7 ?F (38.2 ?C)    ?TempSrc:  Oral    ?SpO2: 97%  97% 97%  ?Weight:      ?Height:      ? ?General:  Appears calm and comfortable and is in NAD ?Eyes:  PERRL, EOMI, normal lids, iris ?ENT:  grossly normal hearing, lips & tongue, mmm; appropriate dentition ?Neck:  no LAD, masses or thyromegaly; no carotid bruits, +JVD ?Cardiovascular:  RRR, no m/r/g. No LE edema.  ?Respiratory:   left lower lung  with decreased breath sounds and expiratory wheezes.   Dyspneic at rest and with talking  ?Abdomen:  soft, protuberant, ND, NT NABS ?Back:   normal alignment, no CVAT ?Skin:  no rash or induration seen on lim

## 2021-08-26 NOTE — Assessment & Plan Note (Signed)
Baseline around 12.7 ?He is at 11.6 today, further makes me thing has some component of volume overload ?Continue oral iron BID and monitor  ?

## 2021-08-26 NOTE — Assessment & Plan Note (Addendum)
Not altered on exam, but has intermittent confusion per wife and has just been more argumentative/combative this last week ?-continue work up for infectious source/abx. UA still pending ?-fever could be contributing  ?-fludrocortisone recently started and can alter mood/anger. Has only had 2 doses. Will hold for now since BP acceptable.  ?-holding narcotics as could be contributing ?-risk for polypharmacy  ?-add on metabolic labs ?-check head CT with fall and head hit  ?-rule out hypoxia ?-Delirium precautions ?-Continue namenda and seroquel  ?

## 2021-08-26 NOTE — ED Notes (Signed)
Pt resting in bed, SpO2 88-90% while resting on RA. Pt placed on 2L via [place]. SpO2 94% and above on 2L.  ?

## 2021-08-26 NOTE — Assessment & Plan Note (Addendum)
S/p stent to LAD ?LHC in 07/2019 no critical stenosis ?Continue medical management with ranexa, lipitor, ASA and plavix  ?

## 2021-08-26 NOTE — Progress Notes (Signed)
Pharmacy Antibiotic Note ? ?Mark Thomas is a 71 y.o. male admitted on 08/26/2021 with sepsis.  Pharmacy has been consulted for cefepime dosing. ? ?WBC wnl, SCr wnl, CrCl ~ 75 mL/min  ? ?Plan: ?-Cefepime 2 gm IV Q 8 hours ?-Monitor CBC, renal fx, cultures and clinical progress ? ?Height: 6' 1" (185.4 cm) ?Weight: 111.1 kg (245 lb) ?IBW/kg (Calculated) : 79.9 ? ?Temp (24hrs), Avg:101.7 ?F (38.7 ?C), Min:100.7 ?F (38.2 ?C), Max:102.7 ?F (39.3 ?C) ? ?Recent Labs  ?Lab 08/26/21 ?1241  ?WBC 7.1  ?CREATININE 1.24  ?LATICACIDVEN 1.8  ?  ?Estimated Creatinine Clearance: 74.5 mL/min (by C-G formula based on SCr of 1.24 mg/dL).   ? ?Allergies  ?Allergen Reactions  ? Dilaudid [Hydromorphone] Other (See Comments)  ?  Respiratory Arrest!!!!  ? Bactrim [Sulfamethoxazole-Trimethoprim] Rash  ? Benadryl [Diphenhydramine] Other (See Comments)  ?  Muscle spasms  ? Phenobarbital Other (See Comments)  ?  From childhood- reaction not recalled at this time  ? Penicillins Other (See Comments)  ?  From childhood- reaction not recalled at this time  ? ? ?Antimicrobials this admission: ?Cefepime 4/27 >>  ? ? ?Dose adjustments this admission: ? ? ?Microbiology results: ?4/27 >> ? ?Thank you for allowing pharmacy to be a part of this patient?s care. ? ?Albertina Parr, PharmD., BCCCP ?Clinical Pharmacist ?Please refer to [facility] for unit-specific pharmacist  ? ? ?

## 2021-08-26 NOTE — Telephone Encounter (Signed)
Spoke with pt's spouse who stated that the pt was taken to the ER by EMS to [facility] in [place] after getting choked 2 times this past week and now having a fever. ?

## 2021-08-26 NOTE — Evaluation (Signed)
Clinical/Bedside Swallow Evaluation ?Patient Details  ?Name: ARBOR COHEN ?MRN: [id number] ?Date of Birth: 08/19/1950 ? ?Today's Date: 08/26/2021 ?Time: SLP Start Time (ACUTE ONLY): 1601 SLP Stop Time (ACUTE ONLY): 0932 ?SLP Time Calculation (min) (ACUTE ONLY): 20 min ? ?Past Medical History:  ?Past Medical History:  ?Diagnosis Date  ? Anxiety   ? Coronary artery disease   ? Depression   ? Essential tremor   ? GERD (gastroesophageal reflux disease)   ? High cholesterol   ? Hypertension   ? Myocardial infarct [facility])   ? Orthostatic hypotension   ? Stroke [facility])   ? Vascular dementia ([place])   ? ?Past Surgical History:  ?Past Surgical History:  ?Procedure Laterality Date  ? APPENDECTOMY    ? CARDIAC SURGERY    ? CHOLECYSTECTOMY    ? HERNIA REPAIR    ? NASAL SINUS SURGERY    ? SHOULDER SURGERY    ? ?HPI:  ?Patient is a 71 y.o. male with PMH: vascular dementia, GERD, CKD stage III, essential tremor, anxiety, HTN, high cholesterol, CVA, orthostatic hypotension, recent admission for PNA. He presented to the hospital with fever of 102.7 F upon arrival. Per patient and spouse, he had a fall day prior to admission, had an episode of choking (on some medications). In ED, patient was afebrile, initially with BP of 70/60.  CXR shows cardiomegaly with bilateral pleural effusion.  CT head/spine/maxiofacial imaging was negative for any acute fractures.  ?  ?Assessment / Plan / Recommendation  ?Clinical Impression ? Patient did not present with any overt s/s of aspiration or penetration with straw sips of thin liquids (water) and bites of puree solids. He and his wife both report frequent coughing and incident of him getting choked and getting blue in the face. SLP did observe that although patient's oxygen saturations remained at 95%, he became very SOB when transitioning from lying in bed to sitting at edge of bed. He had to sit and just breathe for a short period of time before he was ready to try some PO's. Breathing calmed  slightly after he had been sitting up a little bit but SOB did not completely subside until he was lying back down. SLP is recommending Dys 3 solids, thin liquids and as patient has recent h/o dysphagia complaints, PNA's during current and previous admission, and SLP from previous admission recommending possible OP MBS, SLP is recommending to proceed with MBS to r/o any significant dysphagia. Patient and spouse both in agreement. ?SLP Visit Diagnosis: Dysphagia, unspecified (R13.10) ?   ?Aspiration Risk ? Mild aspiration risk  ?  ?Diet Recommendation Dysphagia 3 (Mech soft);Thin liquid  ? ?Liquid Administration via: Cup;Straw ?Medication Administration: Whole meds with puree ?Supervision: Patient able to self feed ?Compensations: Slow rate;Small sips/bites ?Postural Changes: Seated upright at 90 degrees  ?  ?Other  Recommendations Oral Care Recommendations: Oral care BID   ? ?Recommendations for follow up therapy are one component of a multi-disciplinary discharge planning process, led by the attending physician.  Recommendations may be updated based on patient status, additional functional criteria and insurance authorization. ? ?Follow up Recommendations Other (comment) (TBD after MBS)  ? ? ?  ?Assistance Recommended at Discharge Frequent or constant Supervision/Assistance  ?Functional Status Assessment Patient has had a recent decline in their functional status and demonstrates the ability to make significant improvements in function in a reasonable and predictable amount of time.  ?Frequency and Duration min 1 x/week  ?1 week ?  ?   ? ?Prognosis  Prognosis for Safe Diet Advancement: Good  ? ?  ? ?Swallow Study   ?General Date of Onset: 08/26/21 ?HPI: Patient is a 71 y.o. male with PMH: vascular dementia, GERD, CKD stage III, essential tremor, anxiety, HTN, high cholesterol, CVA, orthostatic hypotension, recent admission for PNA. He presented to the hospital with fever of 102.7 F upon arrival. Per patient and  spouse, he had a fall day prior to admission, had an episode of choking (on some medications). In ED, patient was afebrile, initially with BP of 70/60.  CXR shows cardiomegaly with bilateral pleural effusion.  CT head/spine/maxiofacial imaging was negative for any acute fractures. ?Type of Study: Bedside Swallow Evaluation ?Previous Swallow Assessment: BSE during recent admission 08/06/21 ?Diet Prior to this Study: NPO ?Temperature Spikes Noted: No ?Respiratory Status: Nasal cannula ?History of Recent Intubation: No ?Behavior/Cognition: Alert;Cooperative ?Oral Cavity Assessment: Within Functional Limits ?Oral Care Completed by SLP: No ?Oral Cavity - Dentition: Adequate natural dentition ?Vision: Functional for self-feeding ?Self-Feeding Abilities: Able to feed self ?Patient Positioning: Upright in bed ?Baseline Vocal Quality: Normal ?Volitional Cough: Strong ?Volitional Swallow: Able to elicit  ?  ?Oral/Motor/Sensory Function Overall Oral Motor/Sensory Function: Within functional limits   ?Ice Chips     ?Thin Liquid Thin Liquid: Within functional limits ?Presentation: Straw  ?  ?Nectar Thick     ?Honey Thick     ?Puree Puree: Within functional limits ?Presentation: Spoon   ?Solid ? ? ?  Solid: Not tested ?Other Comments: patient declined solids and just wanted to lie back down  ? ?  ? ?Sonia Baller, MA, CCC-SLP ?Speech Therapy ? ? ? ? ? ?

## 2021-08-26 NOTE — Assessment & Plan Note (Addendum)
Has been on multiple rounds of antibiotics/steroids since having covid in 04/2021 with recent hospitalization on 08/04/21 ?PCCM was consulted at that time who recommended outpatient f/u for HRCT and work up for ILD  ?Continue symbicort, scheduled duonebs and SABA prn  ?

## 2021-08-26 NOTE — Assessment & Plan Note (Signed)
Continue lexapro  ?

## 2021-08-27 ENCOUNTER — Observation Stay (HOSPITAL_COMMUNITY): Payer: Medicare Other

## 2021-08-27 DIAGNOSIS — Z20822 Contact with and (suspected) exposure to covid-19: Secondary | ICD-10-CM | POA: Diagnosis present

## 2021-08-27 DIAGNOSIS — E7800 Pure hypercholesterolemia, unspecified: Secondary | ICD-10-CM | POA: Diagnosis present

## 2021-08-27 DIAGNOSIS — D649 Anemia, unspecified: Secondary | ICD-10-CM | POA: Diagnosis present

## 2021-08-27 DIAGNOSIS — Z8673 Personal history of transient ischemic attack (TIA), and cerebral infarction without residual deficits: Secondary | ICD-10-CM | POA: Diagnosis not present

## 2021-08-27 DIAGNOSIS — I951 Orthostatic hypotension: Secondary | ICD-10-CM | POA: Diagnosis present

## 2021-08-27 DIAGNOSIS — J9601 Acute respiratory failure with hypoxia: Secondary | ICD-10-CM | POA: Diagnosis present

## 2021-08-27 DIAGNOSIS — R0609 Other forms of dyspnea: Secondary | ICD-10-CM | POA: Diagnosis not present

## 2021-08-27 DIAGNOSIS — Z955 Presence of coronary angioplasty implant and graft: Secondary | ICD-10-CM | POA: Diagnosis not present

## 2021-08-27 DIAGNOSIS — Z880 Allergy status to penicillin: Secondary | ICD-10-CM | POA: Diagnosis not present

## 2021-08-27 DIAGNOSIS — I2510 Atherosclerotic heart disease of native coronary artery without angina pectoris: Secondary | ICD-10-CM | POA: Diagnosis present

## 2021-08-27 DIAGNOSIS — I5032 Chronic diastolic (congestive) heart failure: Secondary | ICD-10-CM | POA: Diagnosis present

## 2021-08-27 DIAGNOSIS — K219 Gastro-esophageal reflux disease without esophagitis: Secondary | ICD-10-CM | POA: Diagnosis present

## 2021-08-27 DIAGNOSIS — Z888 Allergy status to other drugs, medicaments and biological substances status: Secondary | ICD-10-CM | POA: Diagnosis not present

## 2021-08-27 DIAGNOSIS — Z823 Family history of stroke: Secondary | ICD-10-CM | POA: Diagnosis not present

## 2021-08-27 DIAGNOSIS — Z8616 Personal history of COVID-19: Secondary | ICD-10-CM | POA: Diagnosis not present

## 2021-08-27 DIAGNOSIS — I130 Hypertensive heart and chronic kidney disease with heart failure and stage 1 through stage 4 chronic kidney disease, or unspecified chronic kidney disease: Secondary | ICD-10-CM | POA: Diagnosis present

## 2021-08-27 DIAGNOSIS — I480 Paroxysmal atrial fibrillation: Secondary | ICD-10-CM | POA: Diagnosis present

## 2021-08-27 DIAGNOSIS — Z950 Presence of cardiac pacemaker: Secondary | ICD-10-CM | POA: Diagnosis not present

## 2021-08-27 DIAGNOSIS — N1831 Chronic kidney disease, stage 3a: Secondary | ICD-10-CM | POA: Diagnosis present

## 2021-08-27 DIAGNOSIS — F0153 Vascular dementia, unspecified severity, with mood disturbance: Secondary | ICD-10-CM | POA: Diagnosis present

## 2021-08-27 DIAGNOSIS — I495 Sick sinus syndrome: Secondary | ICD-10-CM | POA: Diagnosis present

## 2021-08-27 DIAGNOSIS — J122 Parainfluenza virus pneumonia: Secondary | ICD-10-CM | POA: Diagnosis present

## 2021-08-27 DIAGNOSIS — Z8249 Family history of ischemic heart disease and other diseases of the circulatory system: Secondary | ICD-10-CM | POA: Diagnosis not present

## 2021-08-27 DIAGNOSIS — A419 Sepsis, unspecified organism: Secondary | ICD-10-CM | POA: Diagnosis present

## 2021-08-27 LAB — ECHOCARDIOGRAM COMPLETE
AR max vel: 0.82 cm2
AV Area VTI: 0.83 cm2
AV Area mean vel: 0.79 cm2
AV Mean grad: 33 mmHg
AV Peak grad: 50.2 mmHg
Ao pk vel: 3.54 m/s
Area-P 1/2: 4.24 cm2
Calc EF: 62.3 %
Height: 73 in
P 1/2 time: 454 ms
S' Lateral: 3.5 cm
Single Plane A2C EF: 60.6 %
Single Plane A4C EF: 63 %
Weight: 4032 [oz_av]

## 2021-08-27 LAB — CBC
HCT: 32.6 % — ABNORMAL LOW (ref 39.0–52.0)
Hemoglobin: 10.8 g/dL — ABNORMAL LOW (ref 13.0–17.0)
MCH: 33.4 pg (ref 26.0–34.0)
MCHC: 33.1 g/dL (ref 30.0–36.0)
MCV: 100.9 fL — ABNORMAL HIGH (ref 80.0–100.0)
Platelets: 166 10*3/uL (ref 150–400)
RBC: 3.23 MIL/uL — ABNORMAL LOW (ref 4.22–5.81)
RDW: 14.4 % (ref 11.5–15.5)
WBC: 5.9 10*3/uL (ref 4.0–10.5)
nRBC: 0.3 % — ABNORMAL HIGH (ref 0.0–0.2)

## 2021-08-27 LAB — BASIC METABOLIC PANEL
Anion gap: 8 (ref 5–15)
BUN: 15 mg/dL (ref 8–23)
CO2: 26 mmol/L (ref 22–32)
Calcium: 8.3 mg/dL — ABNORMAL LOW (ref 8.9–10.3)
Chloride: 102 mmol/L (ref 98–111)
Creatinine, Ser: 1.1 mg/dL (ref 0.61–1.24)
GFR, Estimated: >60 mL/min (ref >60)
Glucose, Bld: 110 mg/dL — ABNORMAL HIGH (ref 70–99)
Potassium: 3.9 mmol/L (ref 3.5–5.1)
Sodium: 136 mmol/L (ref 135–145)

## 2021-08-27 LAB — STREP PNEUMONIAE URINARY ANTIGEN: Strep Pneumo Urinary Antigen: NEGATIVE

## 2021-08-27 LAB — PROCALCITONIN: Procalcitonin: 0.17 ng/mL

## 2021-08-27 MED ORDER — SODIUM CHLORIDE 0.9 % IV SOLN
2.0000 g | Freq: Three times a day (TID) | INTRAVENOUS | Status: DC
  Administered 2021-08-27 – 2021-08-29 (×6): 2 g via INTRAVENOUS
  Filled 2021-08-27 (×7): qty 12.5

## 2021-08-27 MED ORDER — TRAZODONE HCL 50 MG PO TABS: 50.0000 mg | ORAL_TABLET | Freq: Every evening | ORAL | Status: DC | PRN

## 2021-08-27 MED ORDER — MAGNESIUM OXIDE -MG SUPPLEMENT 400 (240 MG) MG PO TABS
400.0000 mg | ORAL_TABLET | Freq: Two times a day (BID) | ORAL | Status: DC
  Administered 2021-08-27 – 2021-08-29 (×5): 400 mg via ORAL
  Filled 2021-08-27 (×5): qty 1

## 2021-08-27 MED ORDER — GUAIFENESIN-CODEINE 100-10 MG/5ML PO SOLN: 10.0000 mL | Freq: Three times a day (TID) | ORAL | Status: DC

## 2021-08-27 MED ORDER — GUAIFENESIN-CODEINE 100-10 MG/5ML PO SOLN
10.0000 mL | Freq: Three times a day (TID) | ORAL | Status: DC
  Administered 2021-08-27 – 2021-08-29 (×7): 10 mL via ORAL
  Filled 2021-08-27 (×7): qty 10

## 2021-08-27 MED ORDER — METHYLPREDNISOLONE SODIUM SUCC 40 MG IJ SOLR
40.0000 mg | Freq: Two times a day (BID) | INTRAMUSCULAR | Status: DC
  Administered 2021-08-27: 40 mg via INTRAVENOUS
  Filled 2021-08-27: qty 1

## 2021-08-27 MED ORDER — SALINE SPRAY 0.65 % NA SOLN
1.0000 | NASAL | Status: DC | PRN
  Administered 2021-08-27: 1 via NASAL
  Filled 2021-08-27: qty 44

## 2021-08-27 MED ORDER — RANOLAZINE ER 500 MG PO TB12
1000.0000 mg | ORAL_TABLET | Freq: Two times a day (BID) | ORAL | Status: DC
  Administered 2021-08-27 – 2021-08-29 (×5): 1000 mg via ORAL
  Filled 2021-08-27 (×5): qty 2

## 2021-08-27 MED ORDER — METHYLPREDNISOLONE SODIUM SUCC 40 MG IJ SOLR
40.0000 mg | Freq: Two times a day (BID) | INTRAMUSCULAR | Status: DC
  Administered 2021-08-27 – 2021-08-29 (×4): 40 mg via INTRAVENOUS
  Filled 2021-08-27 (×4): qty 1

## 2021-08-27 MED ORDER — PERFLUTREN LIPID MICROSPHERE
1.0000 mL | INTRAVENOUS | Status: AC | PRN
  Administered 2021-08-27: 2 mL via INTRAVENOUS
  Filled 2021-08-27: qty 10

## 2021-08-27 MED ORDER — OXYMETAZOLINE HCL 0.05 % NA SOLN
2.0000 | Freq: Two times a day (BID) | NASAL | Status: DC
Start: 2021-08-27 — End: 2021-08-29
  Administered 2021-08-27 – 2021-08-29 (×4): 2 via NASAL
  Filled 2021-08-27: qty 30

## 2021-08-27 NOTE — Progress Notes (Signed)
PT Cancellation Note ? ?Patient Details ?Name: Mark Thomas ?MRN: [id number] ?DOB: 03/18/1951 ? ? ?Cancelled Treatment:    Reason Eval/Treat Not Completed: Fatigue/lethargy limiting ability to participate this afternoon. Had discussion with the pt and his wife who are concerned about return home given difficulties with mobility after last d/c home on 4/10, but the pt states he is too fatigued at this time to attempt evaluation. Will return tomorrow for evaluation.  ? ?West Carbo, PT, DPT  ? ?Acute Rehabilitation Department ?Pager #: [phone] ? ? ?Sandra Cockayne ?08/27/2021, 5:18 PM ?

## 2021-08-27 NOTE — Plan of Care (Signed)
?  Problem: Pain Managment: Goal: General experience of comfort will improve Outcome: Progressing   

## 2021-08-27 NOTE — Progress Notes (Signed)
? ?PROGRESS NOTE ? ?COSME JACOB IDP:[id number] DOB: 1950/06/16 DOA: 08/26/2021 ?PCP: Luetta Nutting, DO ? ?HPI/Recap of past 24 hours: ?Mark Thomas is a 71 y.o. male with medical history significant of HTN, CAD s/p stent to LAD, CVA, MVP, PAF s/p watchman device, vascular dementia, CKD stage 3, HLD, hx of orthostatic hypotension on midodrine who presented to ED with complaints of coughing/fever and intermittent confusion. On Monday he got choked taking his medicine. He coughed up a pill after this. He continued to cough throughout the day. He had another episode of choking on gatorade later in the day. He has continued to cough non stop and his fatigue has gotten worse.  His lightheadedness and dizziness have not gone away since he was in hospital.  When he saw the cardiologist on Monday he was started on fludrocortisone and is on  midodrine which was increased recently as well. Lasix and BP medications stopped. His fever started last night to 102. His cough is wet, but nonproductive. His wife also reports he has seen more confused over the last week and more argumentative and not like himself. He also fell yesterday and hit his head when trying to walk with his walker.   ?  ?Had admit on 4/5-4/10 at [facility] for CAP. CTA chest done showed no PE, but had tree-in -bud and patchy ground glass opacity in bilateral upper lobe compatible with infection/inflammation. Possible post-covid complication. Has been on multiple rounds of antibiotics and steroids since he had covid in 04/2021 with minimal improvement. PCCM consulted who recommended outpatient f/u for HRCT and further w/u for ILD. Has not seen pulmonology yet.  ?  ?Denies vision changes/headaches, chest pain or palpitations, +abdominal pain since fall a few months ago,  no N/V/D, dysuria or leg swelling. No orthopnea. Wife reports a 3 pound weight gain.  ?  ?Wife also reports he has had an issue with pain medication in the past and has been asking for norco  excessively since he got some from his pcp.  ? ?08/27/21:  Seen and examined.  He asks for codeine cough syrup stating that's what works for him.  He feels a burning pain in his chest, worse when he takes a deep breath. ? ?Assessment/Plan: ?Principal Problem: ?  Sepsis ([place]) ?Active Problems: ?  Dyspnea ?  Vascular dementia with increased confusion/anger ?  Orthostatic hypotension ?  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  Paroxysmal atrial fibrillation (HCC) s/p Watchman device ?  CAD (coronary artery disease) ?  CKD (chronic kidney disease) stage 3, GFR 30-59 ml/min (HCC) ?  Depression ?  Anemia ?  Sick sinus syndrome s/p PPM  ? ?Sepsis ([place]) parainfluenza pneumonia ?71 year old male presenting to ED with complaints of choking after medication/cough, fever and intermittent confusion found to be septic with fever to 102.7, tachycardia and tachypnea ?-started on cefepime. Source thought to be lungs with history of cough/choking after eating ?Seen by speech therapist with recommendation for regular consistency diet with thin liquids. ?Continue aspiration precautions. ?Atelectasis as needed, patient requests codeine coughing syrup ? ?Parainfluenza pneumonia, cannot rule out superimposed bacterial infection ?Wheezing on exam, started IV Solu-Medrol 40 mg twice daily x3 days ?Continue cefepime started in ED, x3 days ?DuoNebs every 6 hours ?Antitussives as needed ?Incentive spirometer ?Mobilize as tolerated ? ?Hypomagnesemia ?Magnesium 1.6 ?Repleted orally ? ?Acute on chronic diastolic CHF ?Presented with BNP greater than 1000, trace right pleural effusion on CT scan ?2D echo completed, follow results. ?Start  strict I's and O's and daily weight ?Resume home cardiac medications. ?  ?Dyspnea secondary to above ?Acute hypoxic respiratory failure secondary to above ?Not on oxygen supplementation at baseline ?Currently on 2 L with O2 saturation of 93% ?Wean off oxygen supplementation as tolerated. ? ?Vascular  dementia with increased confusion/anger ?Continue home regimen ?CT head nonacute ?  ?Orthostatic hypotension ?Hypertensive meds held and lasix stopped outpatient due to severe orthostatic hypotension with falls  ?Cardiology recently added midodrine 14m TID and fludrocortisone at .155mon Monday.  ?Blood pressures have been acceptable ?Check orthostatics  ?Will continue midodrine for now, holding fludrocortisone with anger/changes in mental status. Has only had 2 doses ?  ?Paroxysmal atrial fibrillation (HCC) s/p Watchman device ?In NSR, no AC but has watchman ?Continue telemetry  ?  ?CAD (coronary artery disease) ?S/p stent to LAD ?LHC in 07/2019 no critical stenosis ?Continue medical management with ranexa, lipitor, ASA and plavix  ?  ?CKD (chronic kidney disease) stage 3, GFR 30-59 ml/min (HCC) ?Stable, continue to monitor  ?  ?Depression ?Continue lexapro  ?  ?Chronic normocytic anemia ?Baseline around 12.7 ?He is at 11.6 today, further makes me thing has some component of volume overload ?Continue oral iron BID and monitor  ?No overt bleeding. ? ? ?Critical care time: 65 minutes. ?  ?  ?  ?  ?Advance Care Planning:   Code Status: Full Code  ?  ?Consults: SW/PT/OT/SLP  ?  ?DVT Prophylaxis: lovenox  ?  ?Family Communication: wife at bedside: Mark Thomas ? ? ? ?DVT prophylaxis: ?Subcu Lovenox daily ? ?Status is: Inpatient ?Patient requires at least 2 midnights for further evaluation and treatment of present condition. ? ? ? ?Objective: ?Vitals:  ? 08/27/21 0842 08/27/21 0846 08/27/21 0852 08/27/21 1103  ?BP: 124/80   129/64  ?Pulse: 82   81  ?Resp: (!) 22   (!) 26  ?Temp: 99.7 ?F (37.6 ?C)   99.6 ?F (37.6 ?C)  ?TempSrc: Oral   Oral  ?SpO2: 93% 97% 100% 91%  ?Weight:      ?Height:      ? ? ?Intake/Output Summary (Last 24 hours) at 08/27/2021 1231 ?Last data filed at 08/27/2021 0403 ?Gross per 24 hour  ?Intake 360.6 ml  ?Output 850 ml  ?Net -489.4 ml  ? ?Filed Weights  ? 08/26/21 1202 08/26/21 2132 08/27/21 0402   ?Weight: 111.1 kg 114.9 kg 114.3 kg  ? ? ?Exam: ? ?General: [age].o. year-old male well developed well nourished in no acute distress.  Alert and irritable. ?Cardiovascular: Regular rate and rhythm with no rubs or gallops.  No thyromegaly or JVD noted.   ?Respiratory: Diffuse wheezing bilaterally.  Poor inspiratory effort.   ?Abdomen: Soft obese, nontender nondistended with normal bowel sounds x4 quadrants. ?Musculoskeletal: No lower extremity edema. 2/4 pulses in all 4 extremities. ?Skin: No ulcerative lesions noted or rashes, ?Psychiatry: Mood is irritable for condition and setting ? ? ?Data Reviewed: ?CBC: ?Recent Labs  ?Lab 08/26/21 ?1241 08/27/21 ?0105  ?WBC 7.1 5.9  ?NEUTROABS 5.6  --   ?HGB 11.6* 10.8*  ?HCT 34.6* 32.6*  ?MCV 103.0* 100.9*  ?PLT 197 166  ? ?Basic Metabolic Panel: ?Recent Labs  ?Lab 08/26/21 ?1241 08/26/21 ?1720 08/27/21 ?0105  ?NA 134*  --  136  ?K 4.5  --  3.9  ?CL 100  --  102  ?CO2 22  --  26  ?GLUCOSE 163*  --  110*  ?BUN 16  --  15  ?CREATININE 1.24  --  1.10  ?CALCIUM 8.4*  --  8.3*  ?MG  --  1.6*  --   ? ?GFR: ?Estimated Creatinine Clearance: 85.2 mL/min (by C-G formula based on SCr of 1.1 mg/dL). ?Liver Function Tests: ?Recent Labs  ?Lab 08/26/21 ?1241  ?AST 35  ?ALT 24  ?ALKPHOS 48  ?BILITOT 1.2  ?PROT 6.2*  ?ALBUMIN 3.0*  ? ?Recent Labs  ?Lab 08/26/21 ?1241  ?LIPASE 23  ? ?Recent Labs  ?Lab 08/26/21 ?1700  ?AMMONIA 26  ? ?Coagulation Profile: ?Recent Labs  ?Lab 08/26/21 ?1241  ?INR 1.1  ? ?Cardiac Enzymes: ?No results for input(s): CKTOTAL, CKMB, CKMBINDEX, TROPONINI in the last 168 hours. ?BNP (last 3 results) ?Recent Labs  ?  03/18/21 ?1004  ?PROBNP 370  ? ?HbA1C: ?No results for input(s): HGBA1C in the last 72 hours. ?CBG: ?No results for input(s): GLUCAP in the last 168 hours. ?Lipid Profile: ?No results for input(s): CHOL, HDL, LDLCALC, TRIG, CHOLHDL, LDLDIRECT in the last 72 hours. ?Thyroid Function Tests: ?Recent Labs  ?  08/26/21 ?1720  ?TSH 1.055  ? ?Anemia Panel: ?Recent  Labs  ?  08/26/21 ?1720  ?VITAMINB12 299  ? ?Urine analysis: ?   ?Component Value Date/Time  ? COLORURINE YELLOW 08/26/2021 1800  ? APPEARANCEUR CLEAR 08/26/2021 1800  ? LABSPEC 1.014 08/26/2021 1800  ? PHURINE 6.

## 2021-08-27 NOTE — Progress Notes (Signed)
Modified Barium Swallow Progress Note ? ?Patient Details  ?Name: Mark Thomas ?MRN: [id number] ?Date of Birth: 01/31/1951 ? ?Today's Date: 08/27/2021 ? ?Modified Barium Swallow completed.  Full report located under Chart Review in the Imaging Section. ? ?Brief recommendations include the following: ? ?Clinical Impression ? Patient presents with functional oropharyngeal swallow without aspiration or penetration of all consisencies tested *thin, nectar, puree, cracker and tablet with thin.  Swallow was timely and strong without any retention. Pt advises he does not use a straw due to it causing him to "choke" since his CVA.  Barium tablet appeared to transit through esophagus without delay.  Reviewed MBS with pt.  Pt does endorse reflux issues that may contribute to reflux.  No SLP follow up indicated. ?  ?Swallow Evaluation Recommendations ? ?   ? ? SLP Diet Recommendations: Regular solids;Thin liquid ? ? Liquid Administration via: Cup;Straw ? ? Medication Administration: Whole meds with liquid ? ? Supervision: Patient able to self feed ? ? Compensations: Slow rate;Small sips/bites ? ?   ? ? Oral Care Recommendations: Oral care BID ? ?   ?Kathleen Lime, MS CCC SLP ?Acute Rehab Services ?Office [phone] ?Pager [phone] ? ? ? ?Mark Thomas ?08/27/2021,8:44 AM ?

## 2021-08-27 NOTE — Evaluation (Addendum)
Occupational Therapy Evaluation ?Patient Details ?Name: Mark Thomas ?MRN: [id number] ?DOB: 12/27/1950 ?Today's Date: 08/27/2021 ? ? ?History of Present Illness 71 yo male presenting 4/5 after a fall from home, found to be hypotensive upon arrival with SBP 70-80. Chest x-ray shows cardiomegaly with bilateral pleural effusion. CTA chest also showing PNA. PMH includes: anxiety, depression, vascular dementia, CAD, HTN, MI, and stroke.  ? ?Clinical Impression ?  ?PT admitted with BIL pleural effusion. Pt currently with functional limitiations due to the deficits listed below (see OT problem list). Pt bed level evaluation due to feeling "unwell"and "hot" pt reports feeling like burning in his chest. Pt feels warm to touch but does not have fever as tech checked prior to session.  Pt will benefit from skilled OT to increase their independence and safety with adls and balance to allow discharge [place]. ?  ? Wife states "he has been sick ever since [place] in December. He has had PNA 3 times now"  ? ?Recommendations for follow up therapy are one component of a multi-disciplinary discharge planning process, led by the attending physician.  Recommendations may be updated based on patient status, additional functional criteria and insurance authorization.  ? ?Follow Up Recommendations ? Home health OT  ?  ?Assistance Recommended at Discharge Frequent or constant Supervision/Assistance  ?Patient can return home with the following A little help with walking and/or transfers;A little help with bathing/dressing/bathroom;Assistance with cooking/housework;Direct supervision/assist for medications management;Direct supervision/assist for financial management;Assist for transportation ? ?  ?Functional Status Assessment ? Patient has had a recent decline in their functional status and demonstrates the ability to make significant improvements in function in a reasonable and predictable amount of time.  ?Equipment Recommendations ? None  recommended by OT  ?  ?Recommendations for Other Services   ? ? ?  ?Precautions / Restrictions Precautions ?Precautions: Fall ?Precaution Comments: orthostatic, frequent falls at home ?Restrictions ?Weight Bearing Restrictions: No  ? ?  ? ?Mobility Bed Mobility ?Overal bed mobility: Needs Assistance ?Bed Mobility: Supine to Sit ?Rolling: Supervision ?Sidelying to sit: Supervision ?Supine to sit: Supervision ?Sit to supine: Supervision ?  ?  ?  ? ?Transfers ?  ?  ?  ?  ?  ?  ?  ?  ?  ?General transfer comment: declined at this time ?  ? ?  ?Balance Overall balance assessment: History of Falls ?  ?  ?  ?  ?  ?  ?  ?  ?  ?  ?  ?  ?  ?  ?  ?  ?  ?  ?   ? ?ADL either performed or assessed with clinical judgement  ? ?ADL Overall ADL's : Needs assistance/impaired ?Eating/Feeding: Set up;Sitting ?  ?Grooming: Set up;Sitting ?  ?Upper Body Bathing: Sitting;Set up ?  ?  ?  ?  ?  ?  ?  ?  ?  ?  ?  ?  ?  ?  ?General ADL Comments: pt restless in bed and reports feeling hot without fever at this time. Pt agreeable to bed level only. wife attempting to get pt to do orthostatics and pt declined  ? ? ? ?Vision Baseline Vision/History: 1 Wears glasses ?Additional Comments: 99% of the time  ?   ?Perception   ?  ?Praxis   ?  ? ?Pertinent Vitals/Pain Pain Assessment ?Pain Assessment: 0-10 ?Pain Score: 6  ?Pain Location: chest- ?Pain Descriptors / Indicators: Burning ?Pain Intervention(s): Repositioned (wife states pt is complaining of lunngs BURNIng)  ? ? ? ?  Hand Dominance Right ?  ?Extremity/Trunk Assessment Upper Extremity Assessment ?Upper Extremity Assessment: Generalized weakness ?  ?Lower Extremity Assessment ?Lower Extremity Assessment: Defer to PT evaluation ?  ?  ?  ?Communication Communication ?Communication: No difficulties ?  ?Cognition Arousal/Alertness: Awake/alert ?Behavior During Therapy: [facility] for tasks assessed/performed ?Overall Cognitive Status: History of cognitive impairments - at baseline ?  ?  ?  ?  ?  ?  ?  ?  ?   ?  ?  ?  ?  ?  ?  ?  ?General Comments: wife reports increased confusion due to sickness this week ?  ?  ?General Comments  VSS 135/69 (89) supine ? ?  ?Exercises General Exercises - Lower Extremity ?Ankle Circles/Pumps: AROM, Both, 10 reps, Supine ?Other Exercises ?Other Exercises: ice packs for neck. reports feeling hot all over. pt restless and rolling around in the bed. pt after ice on neck has settled and relaxed. ?  ?Shoulder Instructions    ? ? ?Home Living Family/patient expects to be discharged to:: Private residence ?Living Arrangements: Spouse/significant other ?Available Help at Discharge: Available 24 hours/day;Family ?Type of Home: House ?Home Access: Level entry ?  ?  ?Home Layout: One level ?  ?  ?Bathroom Shower/Tub: Walk-in shower ?  ?Bathroom Toilet: Standard ?Bathroom Accessibility: Yes ?  ?Home Equipment: Shower seat;Shower seat - built in;Hand held Museum/gallery curator (4 wheels);Toilet riser;Wheelchair - manual ?  ?Additional Comments: added grab bar horziontal the length of the bathroom - home health OT requested and pt had it installed. pt has golden Pharmacist, hospital- [place] ?  ? ?  ?Prior Functioning/Environment Prior Level of Function : Needs assist;History of Falls (last six months) ?  ?  ?  ?  ?  ?ADLs (physical): Bathing;Dressing;IADLs (wife help into the shower and drying legs off. sponge bath since last admission due to energy levels) ?Mobility Comments: w/c non stop since going home due to orthostatic. Fall Wednesday ?  ?  ? ?  ?  ?OT Problem List: Decreased strength;Decreased activity tolerance;Impaired balance (sitting and/or standing);Decreased safety awareness;Decreased knowledge of use of DME or AE;Pain ?  ?   ?OT Treatment/Interventions: Self-care/ADL training;Therapeutic exercise;DME and/or AE instruction;Therapeutic activities;Patient/family education;Balance training  ?  ?OT Goals(Current goals can be found in the care plan section) Acute Rehab OT Goals ?Patient  Stated Goal: to get cooler ?OT Goal Formulation: With patient ?Time For Goal Achievement: 08/21/21 ?Potential to Achieve Goals: Good  ?OT Frequency: Min 2X/week ?  ? ?Co-evaluation   ?  ?  ?  ?  ? ?  ?AM-PAC OT "6 Clicks" Daily Activity     ?Outcome Measure Help from another person eating meals?: None ?Help from another person taking care of personal grooming?: A Little ?Help from another person toileting, which includes using toliet, bedpan, or urinal?: A Little ?Help from another person bathing (including washing, rinsing, drying)?: A Little ?Help from another person to put on and taking off regular upper body clothing?: A Little ?Help from another person to put on and taking off regular lower body clothing?: A Little ?6 Click Score: 19 ?  ?End of Session Nurse Communication: Mobility status;Precautions ? ?Activity Tolerance: Patient tolerated treatment well ?Patient left: in bed;with call bell/phone within reach;with bed alarm set;with family/visitor present ? ?OT Visit Diagnosis: Unsteadiness on feet (R26.81);Other abnormalities of gait and mobility (R26.89);Repeated falls (R29.6);Muscle weakness (generalized) (M62.81);Pain  ?              ?Time: 1410 [phone] ?OT Time Calculation (  min): 27 min ?Charges:  OT General Charges ?$OT Visit: 1 Visit ?OT Evaluation ?$OT Eval Moderate Complexity: 1 Mod ? ? ?Brynn, OTR/L  ?Acute Rehabilitation Services ?Office: [phone] ?. ? ? ?Jeri Modena ?08/27/2021, 2:38 PM ?

## 2021-08-27 NOTE — Progress Notes (Signed)
Patient refused CHG bath. Patient educated on the need and effect of CHG wipes patient continues to refused at this time.Will continue to educate . ?

## 2021-08-28 DIAGNOSIS — I2510 Atherosclerotic heart disease of native coronary artery without angina pectoris: Secondary | ICD-10-CM

## 2021-08-28 DIAGNOSIS — N183 Chronic kidney disease, stage 3 unspecified: Secondary | ICD-10-CM

## 2021-08-28 DIAGNOSIS — I951 Orthostatic hypotension: Secondary | ICD-10-CM

## 2021-08-28 DIAGNOSIS — J9601 Acute respiratory failure with hypoxia: Secondary | ICD-10-CM

## 2021-08-28 DIAGNOSIS — I495 Sick sinus syndrome: Secondary | ICD-10-CM

## 2021-08-28 DIAGNOSIS — J849 Interstitial pulmonary disease, unspecified: Secondary | ICD-10-CM

## 2021-08-28 DIAGNOSIS — D649 Anemia, unspecified: Secondary | ICD-10-CM

## 2021-08-28 DIAGNOSIS — R0600 Dyspnea, unspecified: Secondary | ICD-10-CM

## 2021-08-28 DIAGNOSIS — I480 Paroxysmal atrial fibrillation: Secondary | ICD-10-CM

## 2021-08-28 DIAGNOSIS — A419 Sepsis, unspecified organism: Secondary | ICD-10-CM | POA: Diagnosis not present

## 2021-08-28 DIAGNOSIS — I10 Essential (primary) hypertension: Secondary | ICD-10-CM

## 2021-08-28 DIAGNOSIS — F32A Depression, unspecified: Secondary | ICD-10-CM

## 2021-08-28 DIAGNOSIS — F0154 Vascular dementia, unspecified severity, with anxiety: Secondary | ICD-10-CM

## 2021-08-28 LAB — PROCALCITONIN: Procalcitonin: 0.1 ng/mL

## 2021-08-28 MED ORDER — BUSPIRONE HCL 10 MG PO TABS
10.0000 mg | ORAL_TABLET | Freq: Once | ORAL | Status: AC
  Administered 2021-08-28: 10 mg via ORAL
  Filled 2021-08-28: qty 1

## 2021-08-28 NOTE — Care Management (Signed)
Verified w Alvis Lemmings that patient is active for RN PT OT prior to admission. Will need home health resumption orders if plan is to DC home w [place]. Spoke w patient at bedside, he deferred decision making to his wife.  ? ?Wife confirms he was active w HH, but had stopped per patient preference, she belives he was confused when making decisions to stop HH.  ? ?She is agreeable to restarting [facility] services. [place] notified.  ? ?Bromwell,ANNETT (Spouse)  ?[phone] ?

## 2021-08-28 NOTE — Progress Notes (Signed)
?PROGRESS NOTE ? ? ? ?Mark Thomas  FHL:[id number] DOB: 01/22/1951 DOA: 08/26/2021 ?PCP: Luetta Nutting, DO ? ? ?Brief Narrative:  ?Mark Thomas is a 71 y.o. male with medical history significant of HTN, CAD s/p stent to LAD, CVA, MVP, PAF s/p watchman device, vascular dementia, CKD stage 3, HLD, hx of orthostatic hypotension on midodrine who presented to ED with complaints of coughing/fever and intermittent confusion.  Admitted for recurrent community-acquired pneumonia meeting sepsis criteria from likely viral versus concurrent bacterial etiology. ?  ?Of note - pt previously admitted 4/5-4/10 for similar episode of commune acquired pneumonia.  Abnormal CTA at that time negative for PE but showed patchy groundglass opacity concerning for post viral complication.  Plan at that time was to discharge on antibiotics and follow-up with pulmonology in the outpatient setting for ILD evaluation but had been unable to make this appointment. ?  ?Assessment & Plan: ?  ?Principal Problem: ?  Sepsis ([place]) ?Active Problems: ?  Dyspnea ?  Vascular dementia with increased confusion/anger ?  Orthostatic hypotension ?  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  Paroxysmal atrial fibrillation (HCC) s/p Watchman device ?  CAD (coronary artery disease) ?  CKD (chronic kidney disease) stage 3, GFR 30-59 ml/min (HCC) ?  Depression ?  Anemia ?  Sick sinus syndrome s/p PPM  ? ? ?Sepsis ([place]) parainfluenza pneumonia, POA ?-Recurrent episodes of coughing, fever with intermittent confusion meeting sepsis criteria now improving ?-Continue cefepime for 5-day course, transition to p.o. at discharge ?-Questionable episodes of choking after eating speech cleared patient for regular diet thin liquids with no aspiration episodes during her evaluation ?-Supportive care ongoing ? ?Acute hypoxic respiratory failure secondary to above  ?Continue steroid taper, antibiotics as above  ?Inhaled medications, antitussives and incentive  spirometry/flutter as tolerated ?DuoNebs every 6 hours ?  ?Hypomagnesemia ?Repleted previously ?  ?Chronic diastolic CHF, not in acute dysfunction ?Patient appears euvolemic ?No longer hypoxic ?Ejection fraction 70 to 37% -grade 2 diastolic dysfunction ?  ?Vascular dementia with increased confusion/anger ?Continue home medications ?CT head non-acute ?Patient somewhat combative today about discharge discussion, but cannot further clarify (other than anxiety) why he is hesitant to discharge in the next 24 to 48 hours ?  ?Orthostatic hypotension ?Improving ?Cardiology recently added midodrine 20m TID and fludrocortisone at .110mon Mondays.  ?Will continue midodrine for now, holding fludrocortisone with anger/changes in mental status(only had 2 doses outpatient, this is a new medication).  ?  ?Paroxysmal atrial fibrillation (HCC) s/p Watchman device ?In NSR, no anticoagulation- has watchman device ?Continue telemetry  ?  ?CAD (coronary artery disease) ?S/p stent to LAD ?LHC in 07/2019 no critical stenosis ?Continue medical management with ranexa, lipitor, ASA and plavix  ?  ?CKD (chronic kidney disease) stage 3, GFR 30-59 ml/min (HCC) ?Stable, continue to monitor  ?  ?Depression ?Continue lexapro  ?  ?Chronic normocytic (iron deficient vs chronic disease) anemia ?Baseline around 12.7 ?Continue oral iron BID and monitor  ?No overt bleeding. ? ?DVT prophylaxis: Lovenox ?Code Status: Full ?Family Communication: Wife unavailable ? ?Status is: Inpatient ? ?Dispo: The patient is from: Home ?             Anticipated d/c is to: Same ?             Anticipated d/c date is: 24 to 48 hours ?             Patient currently IS medically stable for discharge ? ?Consultants:  ?None ? ?  Procedures:  ?None ? ?Antimicrobials:  ?Cefepime x5 days ? ?Subjective: ?No acute issues or events overnight, currently off oxygen at rest, looking forward to ambulation today with staff in hopes for safe discharge.  Denies chest pain nausea vomiting  diarrhea constipation headache fevers chills shortness of breath ? ?Objective: ?Vitals:  ? 08/27/21 1103 08/27/21 1935 08/27/21 2031 08/28/21 0415  ?BP: 129/64  131/75 (!) 145/99  ?Pulse: 81  95 97  ?Resp: (!) 26  19 (!) 22  ?Temp: 99.6 ?F (37.6 ?C)  98.8 ?F (37.1 ?C) (!) 97.5 ?F (36.4 ?C)  ?TempSrc: Oral  Oral Oral  ?SpO2: 91% 95% 95% 95%  ?Weight:    113.6 kg  ?Height:      ? ? ?Intake/Output Summary (Last 24 hours) at 08/28/2021 0703 ?Last data filed at 08/28/2021 0416 ?Gross per 24 hour  ?Intake 630.51 ml  ?Output 800 ml  ?Net -169.49 ml  ? ?Filed Weights  ? 08/26/21 2132 08/27/21 0402 08/28/21 0415  ?Weight: 114.9 kg 114.3 kg 113.6 kg  ? ? ?Examination: ? ?General:  Pleasantly resting in bed, No acute distress. ?HEENT:  Normocephalic atraumatic.  Sclerae nonicteric, noninjected.  Extraocular movements intact bilaterally. ?Neck:  Without mass or deformity.  Trachea is midline. ?Lungs:  Clear to auscultate bilaterally without rhonchi, wheeze, or rales. ?Heart:  Regular rate and rhythm.  Without murmurs, rubs, or gallops. ?Abdomen:  Soft, nontender, nondistended.  Without guarding or rebound. ?Extremities: Without cyanosis, clubbing, edema, or obvious deformity. ?Vascular:  Dorsalis pedis and posterior tibial pulses palpable bilaterally. ?Skin:  Warm and dry, no erythema ? ?Data Reviewed: I have personally reviewed following labs and imaging studies ? ?CBC: ?Recent Labs  ?Lab 08/26/21 ?1241 08/27/21 ?0105  ?WBC 7.1 5.9  ?NEUTROABS 5.6  --   ?HGB 11.6* 10.8*  ?HCT 34.6* 32.6*  ?MCV 103.0* 100.9*  ?PLT 197 166  ? ?Basic Metabolic Panel: ?Recent Labs  ?Lab 08/26/21 ?1241 08/26/21 ?1720 08/27/21 ?0105  ?NA 134*  --  136  ?K 4.5  --  3.9  ?CL 100  --  102  ?CO2 22  --  26  ?GLUCOSE 163*  --  110*  ?BUN 16  --  15  ?CREATININE 1.24  --  1.10  ?CALCIUM 8.4*  --  8.3*  ?MG  --  1.6*  --   ? ?GFR: ?Estimated Creatinine Clearance: 84.9 mL/min (by C-G formula based on SCr of 1.1 mg/dL). ?Liver Function Tests: ?Recent Labs   ?Lab 08/26/21 ?1241  ?AST 35  ?ALT 24  ?ALKPHOS 48  ?BILITOT 1.2  ?PROT 6.2*  ?ALBUMIN 3.0*  ? ?Recent Labs  ?Lab 08/26/21 ?1241  ?LIPASE 23  ? ?Recent Labs  ?Lab 08/26/21 ?1700  ?AMMONIA 26  ? ?Coagulation Profile: ?Recent Labs  ?Lab 08/26/21 ?1241  ?INR 1.1  ? ?Cardiac Enzymes: ?No results for input(s): CKTOTAL, CKMB, CKMBINDEX, TROPONINI in the last 168 hours. ?BNP (last 3 results) ?Recent Labs  ?  03/18/21 ?1004  ?PROBNP 370  ? ?HbA1C: ?No results for input(s): HGBA1C in the last 72 hours. ?CBG: ?No results for input(s): GLUCAP in the last 168 hours. ?Lipid Profile: ?No results for input(s): CHOL, HDL, LDLCALC, TRIG, CHOLHDL, LDLDIRECT in the last 72 hours. ?Thyroid Function Tests: ?Recent Labs  ?  08/26/21 ?1720  ?TSH 1.055  ? ?Anemia Panel: ?Recent Labs  ?  08/26/21 ?1720  ?VITAMINB12 299  ? ?Sepsis Labs: ?Recent Labs  ?Lab 08/26/21 ?1241 08/26/21 ?1720 08/27/21 ?0105 08/28/21 ?0351  ?PROCALCITON  --  0.14 0.17 0.10  ?LATICACIDVEN 1.8  --   --   --   ? ? ?Recent Results (from the past 240 hour(s))  ?Resp Panel by RT-PCR (Flu A&B, Covid) Nasopharyngeal Swab     Status: None  ? Collection Time: 08/26/21 12:07 PM  ? Specimen: Nasopharyngeal Swab; Nasopharyngeal(NP) swabs in vial transport medium  ?Result Value Ref Range Status  ? SARS Coronavirus 2 by RT PCR NEGATIVE NEGATIVE Final  ?  Comment: (NOTE) ?SARS-CoV-2 target nucleic acids are NOT DETECTED. ? ?The SARS-CoV-2 RNA is generally detectable in upper respiratory ?specimens during the acute phase of infection. The lowest ?concentration of SARS-CoV-2 viral copies this assay can detect is ?138 copies/mL. A negative result does not preclude SARS-Cov-2 ?infection and should not be used as the sole basis for treatment or ?other patient management decisions. A negative result may occur with  ?improper specimen collection/handling, submission of specimen other ?than nasopharyngeal swab, presence of viral mutation(s) within the ?areas targeted by this assay, and  inadequate number of viral ?copies(<138 copies/mL). A negative result must be combined with ?clinical observations, patient history, and epidemiological ?information. The expected result is Negative. ? ?Fact Sheet for [employer]

## 2021-08-28 NOTE — Progress Notes (Signed)
SATURATION QUALIFICATIONS: (This note is used to comply with regulatory documentation for home oxygen) ? ?Patient Saturations on Room Air at Rest = 97% ? ?Patient Saturations on Room Air while Ambulating = 95% ? ?Pt did not required oxygen while ambulating.  ?

## 2021-08-28 NOTE — Evaluation (Signed)
Physical Therapy Evaluation ?Patient Details ?Name: Mark Thomas ?MRN: [id number] ?DOB: 26-Mar-1951 ?Today's Date: 08/28/2021 ? ?History of Present Illness ? 71 yo male admitted 4/27 with fever, cough, confusion and fall. Pt with sepsis after choking on pill at home. Pt with recent admit 4/5-4/10 with CAP. PMhx: anxiety,depression, dementia, CAD, HTN, MI, CVA, PPM, gout  ?Clinical Impression ? Pt pleasant and reports still feeling very fatigued and ill. Pt needed encouragement to mobilize and denied OOB to chair.  Pt able to walk in room with RW and guarding. Pt with decreased strength, balance and activity tolerance who will benefit from acute therapy to maximize mobility, safety and function to decrease fall risk and burden of care. ? ?Orthostatic BPs ? ?Supine 152/79  ?Sitting 133/103  ?   ?Standing 118/75  ?Standing after 3 min 136/85  ? ? ?   ? ?Recommendations for follow up therapy are one component of a multi-disciplinary discharge planning process, led by the attending physician.  Recommendations may be updated based on patient status, additional functional criteria and insurance authorization. ? ?Follow Up Recommendations Home health PT ? ?  ?Assistance Recommended at Discharge Frequent or constant Supervision/Assistance  ?Patient can return home with the following ? A little help with walking and/or transfers;A little help with bathing/dressing/bathroom;Assistance with cooking/housework;Direct supervision/assist for medications management;Assist for transportation;Help with stairs or ramp for entrance ? ?  ?Equipment Recommendations None recommended by PT  ?Recommendations for Other Services ?    ?  ?Functional Status Assessment Patient has had a recent decline in their functional status and demonstrates the ability to make significant improvements in function in a reasonable and predictable amount of time.  ? ?  ?Precautions / Restrictions Precautions ?Precautions: Fall ?Precaution Comments: orthostatic,  frequent falls at home  ? ?  ? ?Mobility ? Bed Mobility ?Overal bed mobility: Needs Assistance ?Bed Mobility: Sit to Sidelying ?  ?Sidelying to sit: Supervision ?  ?  ?Sit to sidelying: Supervision ?General bed mobility comments: pt sidelying and able to rise to sitting and back without physical assist ?  ? ?Transfers ?Overall transfer level: Needs assistance ?  ?Transfers: Sit to/from Stand ?Sit to Stand: Min guard ?  ?  ?  ?  ?  ?General transfer comment: pt able to stand with cues for hand placement ?  ? ?Ambulation/Gait ?Ambulation/Gait assistance: Min guard ?Gait Distance (Feet): 20 Feet ?Assistive device: Rolling walker (2 wheels) ?Gait Pattern/deviations: Step-through pattern, Decreased stride length ?  ?Gait velocity interpretation: 1.31 - 2.62 ft/sec, indicative of limited community ambulator ?  ?General Gait Details: cues for posture, pt declined increased gait due to fatigue ? ?Stairs ?  ?  ?  ?  ?  ? ?Wheelchair Mobility ?  ? ?Modified Rankin (Stroke Patients Only) ?  ? ?  ? ?Balance Overall balance assessment: History of Falls ?  ?  ?  ?  ?  ?  ?  ?  ?  ?  ?  ?  ?  ?  ?  ?  ?  ?  ?   ? ? ? ?Pertinent Vitals/Pain Pain Assessment ?Pain Assessment: No/denies pain  ? ? ?Home Living Family/patient expects to be discharged to:: Private residence ?Living Arrangements: Spouse/significant other ?Available Help at Discharge: Available 24 hours/day;Family ?Type of Home: House ?Home Access: Level entry ?  ?  ?  ?Home Layout: One level ?Home Equipment: Shower seat;Shower seat - built in;Hand held Museum/gallery curator (4 wheels);Toilet riser;Wheelchair - manual ?Additional Comments: added grab bar  horziontal the length of the bathroom - home health OT requested and pt had it installed. pt has golden Pharmacist, hospital- [place]  ?  ?Prior Function Prior Level of Function : Needs assist;History of Falls (last six months) ?  ?  ?  ?Physical Assist : Mobility (physical);ADLs (physical) ?  ?ADLs (physical):  Bathing;Dressing;IADLs ?Mobility Comments: rollator and WC at home due to feeling badly and low BP ?ADLs Comments: wife helps him into and out of shower ?  ? ? ?Hand Dominance  ?   ? ?  ?Extremity/Trunk Assessment  ? Upper Extremity Assessment ?Upper Extremity Assessment: Generalized weakness ?  ? ?Lower Extremity Assessment ?Lower Extremity Assessment: Generalized weakness ?  ? ?Cervical / Trunk Assessment ?Cervical / Trunk Assessment: Normal  ?Communication  ? Communication: No difficulties  ?Cognition Arousal/Alertness: Awake/alert ?Behavior During Therapy: [facility] for tasks assessed/performed ?Overall Cognitive Status: History of cognitive impairments - at baseline ?  ?  ?  ?  ?  ?  ?  ?  ?  ?  ?  ?  ?  ?  ?  ?  ?General Comments: pt will lose train of thought mid sentence grossly 30% of the time ?  ?  ? ?  ?General Comments   ? ?  ?Exercises    ? ?Assessment/Plan  ?  ?PT Assessment Patient needs continued PT services  ?PT Problem List Decreased strength;Decreased range of motion;Decreased activity tolerance;Decreased balance;Decreased mobility;Decreased coordination ? ?   ?  ?PT Treatment Interventions DME instruction;Gait training;Stair training;Functional mobility training;Therapeutic activities;Therapeutic exercise;Balance training;Patient/family education   ? ?PT Goals (Current goals can be found in the Care Plan section)  ?Acute Rehab PT Goals ?Patient Stated Goal: be ok to go home and not be a burden on my wife ?PT Goal Formulation: With patient ?Time For Goal Achievement: 09/11/21 ?Potential to Achieve Goals: Fair ? ?  ?Frequency Min 3X/week ?  ? ? ?Co-evaluation   ?  ?  ?  ?  ? ? ?  ?AM-PAC PT "6 Clicks" Mobility  ?Outcome Measure Help needed turning from your back to your side while in a flat bed without using bedrails?: None ?Help needed moving from lying on your back to sitting on the side of a flat bed without using bedrails?: A Little ?Help needed moving to and from a bed to a chair (including a  wheelchair)?: A Little ?Help needed standing up from a chair using your arms (e.g., wheelchair or bedside chair)?: A Little ?Help needed to walk in hospital room?: A Lot ?Help needed climbing 3-5 steps with a railing? : Total ?6 Click Score: 16 ? ?  ?End of Session   ?Activity Tolerance: Patient limited by fatigue ?Patient left: in bed;with call bell/phone within reach;with bed alarm set ?Nurse Communication: Mobility status ?PT Visit Diagnosis: Other abnormalities of gait and mobility (R26.89);Unsteadiness on feet (R26.81);Muscle weakness (generalized) (M62.81);History of falling (Z91.81) ?  ? ?Time: 4585-9292 ?PT Time Calculation (min) (ACUTE ONLY): 30 min ? ? ?Charges:   PT Evaluation ?$PT Eval Moderate Complexity: 1 Mod ?PT Treatments ?$Therapeutic Activity: 8-22 mins ?  ?   ? ? ?Mark Thomas, PT ?Acute Rehabilitation Services ?Pager: [phone] ?Office: [phone] ? ? ?Mark Thomas B Sincerity Cedar ?08/28/2021, 10:48 AM ? ?

## 2021-08-28 NOTE — Progress Notes (Signed)
Patient complaining of increasing anxiety,MD made aware. ?

## 2021-08-28 NOTE — Plan of Care (Signed)
?  Problem: Clinical Measurements: ?Goal: Ability to maintain a body temperature in the normal range will improve ?Outcome: Progressing ?  ?Problem: Safety: ?Goal: Ability to remain free from injury will improve ?Outcome: Progressing ?  ?

## 2021-08-29 DIAGNOSIS — D649 Anemia, unspecified: Secondary | ICD-10-CM | POA: Diagnosis not present

## 2021-08-29 DIAGNOSIS — J9601 Acute respiratory failure with hypoxia: Secondary | ICD-10-CM | POA: Diagnosis not present

## 2021-08-29 DIAGNOSIS — I2510 Atherosclerotic heart disease of native coronary artery without angina pectoris: Secondary | ICD-10-CM | POA: Diagnosis not present

## 2021-08-29 DIAGNOSIS — A419 Sepsis, unspecified organism: Secondary | ICD-10-CM | POA: Diagnosis not present

## 2021-08-29 MED ORDER — SODIUM CHLORIDE 0.9 % IV SOLN: INTRAVENOUS | Status: DC

## 2021-08-29 MED ORDER — NITROGLYCERIN 0.4 MG SL SUBL
SUBLINGUAL_TABLET | SUBLINGUAL | Status: AC
  Administered 2021-08-29: 0.4 mg
  Filled 2021-08-29: qty 1

## 2021-08-29 MED ORDER — CEPHALEXIN 250 MG PO CAPS
250.0000 mg | ORAL_CAPSULE | Freq: Four times a day (QID) | ORAL | 0 refills | Status: AC
Start: 2021-08-29 — End: 2021-08-31

## 2021-08-29 MED ORDER — NITROGLYCERIN 0.4 MG SL SUBL: 0.4000 mg | SUBLINGUAL_TABLET | SUBLINGUAL | Status: DC | PRN

## 2021-08-29 MED ORDER — METHYLPREDNISOLONE 4 MG PO TBPK: ORAL_TABLET | ORAL | 0 refills | Status: DC

## 2021-08-29 NOTE — Progress Notes (Signed)
Occupational Therapy Treatment ?Patient Details ?Name: Mark Thomas ?MRN: [id number] ?DOB: 05/17/1950 ?Today's Date: 08/29/2021 ? ? ?History of present illness 71 yo male admitted 4/27 with fever, cough, confusion and fall. Pt with sepsis after choking on pill at home. Pt with recent admit 4/5-4/10 with CAP. PMhx: anxiety,depression, dementia, CAD, HTN, MI, CVA, PPM, gout ?  ?OT comments ? Pt standing with NT in room for transfer from recliner to bed. Pt sidelying in bed and able to bring self to sitting EOB for light ADL. Pt reported slight dizziness throughout. Pt reports feeling better, "but not ready to go home today." Pt reports that he still feels weak and has coughig fits. Pt set-upA to minA overall for ADL. Pt's spouse is his caregiver. Pt education provided for energy conservation techniques. supine BP: 183/90 (116), 83 BPM, 98% O2; sitting BP: 160/99, 91 BPM and O2 >94%. ?Continue toward POC, OT following acutely.  ? ?Recommendations for follow up therapy are one component of a multi-disciplinary discharge planning process, led by the attending physician.  Recommendations may be updated based on patient status, additional functional criteria and insurance authorization. ?   ?Follow Up Recommendations ? Home health OT  ?  ?Assistance Recommended at Discharge Frequent or constant Supervision/Assistance  ?Patient can return home with the following ? A little help with walking and/or transfers;A little help with bathing/dressing/bathroom;Assistance with cooking/housework;Direct supervision/assist for medications management;Direct supervision/assist for financial management;Assist for transportation ?  ?Equipment Recommendations ? None recommended by OT  ?  ?Recommendations for Other Services   ? ?  ?Precautions / Restrictions Precautions ?Precautions: Fall ?Precaution Comments: orthostatic, frequent falls at home ?Restrictions ?Weight Bearing Restrictions: No  ? ? ?  ? ?Mobility Bed Mobility ?Overal bed  mobility: Needs Assistance ?Bed Mobility: Sit to Sidelying, Sidelying to Sit ?  ?Sidelying to sit: Supervision ?  ?  ?Sit to sidelying: Supervision ?General bed mobility comments: pt sidelying and able to rise to sitting and back without physical assist ?  ? ?Transfers ?Overall transfer level: Needs assistance ?Equipment used: None ?Transfers: Bed to chair/wheelchair/BSC ?Sit to Stand: Min guard ?  ?  ?Step pivot transfers: Min guard ?  ?  ?General transfer comment: stepping toward bed- taking steps to [facility] ?  ?  ?Balance Overall balance assessment: History of Falls ?  ?  ?  ?  ?  ?  ?  ?  ?  ?  ?  ?  ?  ?  ?  ?  ?  ?  ?   ? ?ADL either performed or assessed with clinical judgement  ? ?ADL Overall ADL's : Needs assistance/impaired ?  ?  ?Grooming: Set up;Sitting;Brushing hair;Oral care;Wash/dry face;Wash/dry hands ?Grooming Details (indicate cue type and reason): had just returned to bed with NT so pt willing to sit at EOB, but unwilling to ambulate to sink. ?  ?  ?  ?  ?  ?  ?  ?  ?Toilet Transfer: Min guard;Stand-pivot ?Toilet Transfer Details (indicate cue type and reason): stand pivot from recliner to bed ?  ?  ?  ?  ?Functional mobility during ADLs: Min guard;Cueing for safety ?General ADL Comments: Pt reports feeling better, "but not ready to go home today." Pt reports that he still feels weak and has coughig fits. Pt set-upA to minA overall for ADL. Pt's spouse is his caregiver. Pt education provided for energy conservation techniques. ?  ? ?Extremity/Trunk Assessment Upper Extremity Assessment ?Upper Extremity Assessment: Generalized weakness ?  ?Lower Extremity  Assessment ?Lower Extremity Assessment: Generalized weakness;Defer to PT evaluation ?  ?  ?  ? ?Vision   ?Vision Assessment?: No apparent visual deficits ?  ?Perception   ?  ?Praxis   ?  ? ?Cognition Arousal/Alertness: Awake/alert ?Behavior During Therapy: [facility] for tasks assessed/performed ?Overall Cognitive Status: History of cognitive impairments -  at baseline ?  ?  ?  ?  ?  ?  ?  ?  ?  ?  ?  ?  ?  ?  ?  ?  ?General Comments: Pt completing sentences 75% of the time today with increased time to complete thoughts ?  ?  ?   ?Exercises   ? ?  ?Shoulder Instructions   ? ? ?  ?General Comments supine BP: 183/90 (116), 83 BPM, 98% O2; sitting BP: 160/99, 91 BPM and O2 >94%.  ? ? ?Pertinent Vitals/ Pain       Pain Assessment ?Pain Assessment: No/denies pain ?Pain Intervention(s): Monitored during session ? ?Home Living   ?  ?  ?  ?  ?  ?  ?  ?  ?  ?  ?  ?  ?  ?  ?  ?  ?  ?  ? ?  ?Prior Functioning/Environment    ?  ?  ?  ?   ? ?Frequency ? Min 2X/week  ? ? ? ? ?  ?Progress Toward Goals ? ?OT Goals(current goals can now be found in the care plan section) ? Progress towards OT goals: Progressing toward goals ? ?Acute Rehab OT Goals ?Patient Stated Goal: to go home tomorrow or go to rehab ?OT Goal Formulation: With patient ?Time For Goal Achievement: 09/04/21 ?Potential to Achieve Goals: Good ?ADL Goals ?Pt Will Perform Grooming: with supervision;sitting ?Pt Will Perform Upper Body Bathing: with supervision;sitting ?Pt Will Perform Lower Body Bathing: with modified independence;sit to/from stand ?Pt Will Perform Lower Body Dressing: with modified independence;sit to/from stand ?Pt Will Transfer to Toilet: with supervision;ambulating;bedside commode  ?Plan Discharge plan remains appropriate   ? ?Co-evaluation ? ? ?   ?  ?  ?  ?  ? ?  ?AM-PAC OT "6 Clicks" Daily Activity     ?Outcome Measure ? ? Help from another person eating meals?: None ?Help from another person taking care of personal grooming?: A Little ?Help from another person toileting, which includes using toliet, bedpan, or urinal?: A Little ?Help from another person bathing (including washing, rinsing, drying)?: A Little ?Help from another person to put on and taking off regular upper body clothing?: A Little ?Help from another person to put on and taking off regular lower body clothing?: A Little ?6 Click  Score: 19 ? ?  ?End of Session   ? ?OT Visit Diagnosis: Unsteadiness on feet (R26.81);Other abnormalities of gait and mobility (R26.89);Repeated falls (R29.6);Muscle weakness (generalized) (M62.81) ?  ?Activity Tolerance Patient tolerated treatment well ?  ?Patient Left in bed;with call bell/phone within reach;with bed alarm set ?  ?Nurse Communication Mobility status ?  ? ?   ? ?Time: 0962-8366 ?OT Time Calculation (min): 23 min ? ?Charges: OT General Charges ?$OT Visit: 1 Visit ?OT Treatments ?$Self Care/Home Management : 8-22 mins ?$Therapeutic Activity: 8-22 mins ? ?Jefferey Pica, OTR/L ?Acute Rehabilitation Services ?Office: [phone] ? ? ?Jenene Slicker Nashira Mcglynn ?08/29/2021, 2:13 PM ?

## 2021-08-29 NOTE — Progress Notes (Addendum)
Patient complained of chest pain patient assessed ekg obtained,chest pain protocol initiated  ?BP (!) 173/104   Pulse 91   Temp 98.2 ?F (36.8 ?C) (Oral)   Resp 18   Ht _0  (1.854 m)   Wt 114.3 kg Comment: scale c  SpO2 97%   BMI 33.25 kg/m?   ? ?Md made aware ?

## 2021-08-29 NOTE — Progress Notes (Signed)
Patient and wife received discharge was upset about being a taxi when the plan for a family to pick and take home. Also upset because patient discharge last admission to soon came back with PNA. Also was concerned about blood pressure possible being too low. Blood pressure was checked 3 times sitting and standing and after bathroom. Vitals stable. And family ok with discharge. ?

## 2021-08-29 NOTE — TOC Transition Note (Signed)
Transition of Care (TOC) - CM/SW Discharge Note ? ? ?Patient Details  ?Name: Mark Thomas ?MRN: [id number] ?Date of Birth: 09/06/1950 ? ?Transition of Care (TOC) CM/SW Contact:  ?Carles Collet, RN ?Phone Number: ?08/29/2021, 11:21 AM ? ? ?Clinical Narrative:   Alvis Lemmings notified that patient has DC order for today. Spoke w bedside nurse. She states she has spoken with wife who is aware of DC and will provide transportation home.  ? ? ? ?Final next level of care: [place] ?Barriers to Discharge: No Barriers Identified ? ? ?Patient Goals and CMS Choice ?  ?CMS Medicare.gov Compare Post Acute Care list provided to:: Other (Comment Required) ?Choice offered to / list presented to : Spouse ? ?Discharge Placement ?  ?           ?  ?  ?  ?  ? ?Discharge Plan and Services ?  ?  ?           ?DME Arranged: N/A ?  ?  ?  ?  ?HH Arranged: PT, OT ?[place] Agency: [place] ?Date HH Agency Contacted: 08/29/21 ?Time [place]: 1975 ?Representative spoke with at [place]: Tommi Rumps ? ?Social Determinants of Health (SDOH) Interventions ?  ? ? ?Readmission Risk Interventions ?   ? View : No data to display.  ?  ?  ?  ? ? ? ? ? ?

## 2021-08-29 NOTE — Discharge Summary (Signed)
Physician Discharge Summary  ?Mark Thomas TDH:[id number] DOB: Jun 26, 1950 DOA: 08/26/2021 ? ?PCP: Luetta Nutting, DO ? ?Admit date: 08/26/2021 ?Discharge date: 08/29/2021 ? ?Admitted From: Home ?Disposition: Home  ? ?Recommendations for Outpatient Follow-up:  ?Follow up with PCP in 1-2 weeks ?Please obtain BMP/CBC in one week ?Please follow up on the following pending results: ? ?Home Health: PT ?Equipment/Devices: None ? ?Discharge Condition: Stable ?CODE STATUS: Full ?Diet recommendation: Low-salt low-fat low-carb diet ? ?Brief/Interim Summary: ?Mark Thomas is a 71 y.o. male with medical history significant of HTN, CAD s/p stent to LAD, CVA, MVP, PAF s/p watchman device, vascular dementia, CKD stage 3, HLD, hx of orthostatic hypotension on midodrine who presented to ED with complaints of coughing/fever and intermittent confusion.  Admitted for recurrent community-acquired pneumonia meeting sepsis criteria from likely viral versus concurrent bacterial etiology. ?  ?Patient has had multiple hospitalizations since initial COVID diagnosis with 5 recurrent episodes of "pneumonia" per discussion with wife over the phone.  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  During his hospitalization patient's respiratory panel was positive for parainfluenza virus.  Patient also had echocardiogram confirming diastolic grade 2 dysfunction which appears to be a new diagnosis for the patient.  Lengthy discussion with patient and wife about needs for close follow-up with Dr. Agustin Cree his cardiologist as well as with pulmonology in the next few weeks for formal lung function testing as he appears to be chronically on inhalers, nebulizers as well as Symbicort with no definitive diagnosis of ILD, COPD or asthma.  At this time patient is ambulating without hypoxia chest pain nausea vomiting headache fevers chills or dizziness.  He  is otherwise stable and agreeable for discharge home at this time.  Continue antibiotics and steroid taper at discharge, no other medication changes during hospitalization as below. ? 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 ?Discharge Diagnoses:  ?Principal Problem: ?  Sepsis ([place]) ?Active Problems: ?  Dyspnea ?  Vascular dementia with increased confusion/anger ?  Orthostatic hypotension ?  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  Paroxysmal atrial fibrillation (HCC) s/p Watchman device ?  CAD (coronary artery disease) ?  CKD (chronic kidney disease) stage 3, GFR 30-59 ml/min (HCC) ?  Depression ?  Anemia ?  Sick sinus syndrome s/p PPM  ? ? ? ?Discharge Instructions ? ?Discharge Instructions   ? ? AMB referral to CHF clinic   Complete by: As directed ?  ? Call MD for:  difficulty breathing, headache or visual disturbances   Complete by: As directed ?  ? Call MD for:  temperature >100.4   Complete by: As directed ?  ? Diet - low sodium heart healthy   Complete by: As directed ?  ? Increase activity slowly   Complete by: As directed ?  ? ?  ? ?Allergies as of 08/29/2021   ? ?   Reactions  ? Dilaudid [hydromorphone] Other (See Comments)  ? Respiratory Arrest!!!!  ? Bactrim [sulfamethoxazole-trimethoprim] Rash  ? Benadryl [diphenhydramine] Other (See Comments)  ? Muscle spasms  ? Phenobarbital Other (See Comments)  ? From  childhood- reaction not recalled at this time  ? Penicillins Other (See Comments)  ? From childhood- reaction not recalled at this time  ? ?  ? ?   ?Medication List  ?  ? ?STOP taking these medications   ? ?HYDROcodone-acetaminophen 5-325 MG tablet ?Commonly known as: NORCO/VICODIN ?  ? ?  ? ?TAKE these medications   ? ?albuterol 108 (90 Base) MCG/ACT inhaler ?Commonly known as: VENTOLIN HFA ?Inhale 2 puffs into the lungs every 6 (six) hours as needed for wheezing or shortness of breath. ?What changed: Another medication with the same name was changed. Make sure you understand how and when to take each. ?  ?albuterol (2.5 MG/3ML) 0.083% nebulizer solution ?Commonly known as: PROVENTIL ?Take 3 mLs (2.5 mg total) by nebulization every 6 (six) hours as needed for wheezing or shortness of breath. ?What changed:  ?when to take this ?additional instructions ?  ?allopurinol 100 MG tablet ?Commonly known as: ZYLOPRIM ?Take 100 mg by mouth daily. ?  ?aspirin 81 MG EC tablet ?Take 81 mg by mouth in the morning. ?  ?atorvastatin 80 MG tablet ?Commonly known as: LIPITOR ?Take 80 mg by mouth daily. ?  ?b complex vitamins capsule ?Take 1 capsule by mouth daily. ?  ?cephALEXin 250 MG capsule ?Commonly known as: KEFLEX ?Take 1 capsule (250 mg total) by mouth 4 (four) times daily for 2 days. ?  ?clopidogrel 75 MG tablet ?Commonly known as: PLAVIX ?Take 75 mg by mouth daily. ?  ?escitalopram 20 MG tablet ?Commonly known as: LEXAPRO ?Take 1.5 tablets (30 mg total) by mouth daily. ?  ?ferrous sulfate 325 (65 FE) MG EC tablet ?Take 1 tablet (325 mg total) by mouth in the morning and at bedtime. ?  ?fludrocortisone 0.1 MG tablet ?Commonly known as: FLORINEF ?Take 1 tablet (0.1 mg total) by mouth daily. ?  ?latanoprost 0.005 % ophthalmic solution ?Commonly known as: XALATAN ?Place 1 drop into both eyes at bedtime. ?  ?magnesium oxide 400 MG tablet ?Commonly known as: MAG-OX ?Take 400 mg by mouth daily. ?  ?memantine 10 MG tablet ?Commonly known as: Namenda ?Take 1 tablet (10 mg total) by mouth 2 (two) times daily. ?What changed: when to take this ?  ?methylPREDNISolone 4 MG Tbpk  tablet ?Commonly known as: MEDROL DOSEPAK ?Taper as directed ?  ?midodrine 5 MG tablet ?Commonly known as: PROAMATINE ?Take 1 tablet (5 mg total) by mouth 3 (three) times daily with meals. ?  ?nitroGLYCERIN 0.4 MG/SPRAY spray ?Commonly known as: NITROLINGUAL ?Place 1 spray under the tongue every 5 (five) minutes x 3 doses as needed for chest pain. ?  ?pantoprazole 40 MG tablet ?Commonly known as: PROTONIX ?Take 1 tablet (40 mg total) by mouth every evening. ?  ?potassium citrate 10 MEQ (1080 MG) SR tablet ?Commonly known as: UROCIT-K ?Take 10 mEq by mouth daily. ?  ?primidone 50 MG tablet ?Commonly known as: MYSOLINE ?Take 2 tablets (100 mg total) by mouth 2 (two) times daily. ?  ?QUEtiapine 50 MG tablet ?Commonly known as: SEROQUEL ?Take 2 tablets (100 mg total) by mouth at bedtime. ?  ?ranolazine 1000 MG SR tablet ?Commonly known as: RANEXA ?Take 1,000 mg by mouth in the morning and at bedtime. ?  ?rOPINIRole 4 MG 24 hr tablet ?Commonly known as: REQUIP XL ?Take 1 tablet (4 mg total) by mouth at bedtime. ?  ?Symbicort 160-4.5 MCG/ACT inhaler ?Generic drug: budesonide-formoterol ?Inhale 2 puffs into the lungs 2 (two) times daily. ?  ?triamcinolone ointment 0.1 % ?  Commonly known as: KENALOG ?Apply 1 application. topically as needed (to affected areas- for psoriasis flares). ?  ?Vitamin D3 10 MCG (400 UNIT) Caps ?Take 400 Units by mouth in the morning. ?  ? ?  ? ? Follow-up Information   ? ? Luetta Nutting, DO. Go on 09/06/2021.   ?Specialty: Family Medicine ?Why: _0 :10am ?Contact information: ?[address]  ?[address] ?[place] [address] ?[phone] ? ? ?  ?  ? ? Park Liter, MD .   ?Specialty: Cardiology ?Contact information: ?[address] ?[place] [address] ?[phone] ? ? ?  ?  ? ? Care, [facility] Follow up.   ?Specialty: Home Health Services ?Why: for home health services, they will contact you in 1-2 days to set up your first home visit. ?Contact information: ?[address] ?[address] ?[place] [address] ?[phone] ? ? ?  ?  ? ?  ?  ? ?  ? ?Allergies  ?Allergen Reactions  ? Dilaudid [Hydromorphone] Other (See Comments)  ?  Respiratory Arrest!!!!  ? Bactrim [Sulfamethoxazol

## 2021-08-29 NOTE — Progress Notes (Signed)
Patient reassessed patient still complaining of pain to chest patient offered additional nitro for pain , pt refused.Patient educated patient continues to refuse at this time.Patient asked to be covered with blanket so he can sleep because he misses his dog.  ? ?BP (!) 157/89 (BP Location: Left Arm)   Pulse 96   Temp 98 ?F (36.7 ?C) (Oral)   Resp 16   Ht 6' 1" (1.854 m)   Wt 114.3 kg Comment: scale c  SpO2 98%   BMI 33.25 kg/m?   ? ?  ?

## 2021-08-30 ENCOUNTER — Telehealth: Payer: Self-pay | Admitting: Cardiology

## 2021-08-30 NOTE — Telephone Encounter (Signed)
Advised that 09/14/21 appointment would be ok as no specific timeframe was requested. Pt has FU with PCP 1 week.  ?

## 2021-08-30 NOTE — Telephone Encounter (Signed)
Patient's wife states the patient recently got out of the hospital. He has an appointment 5/16 and she would like to know if he needs to be seen sooner or if he can just keep the appointment as is. ?

## 2021-08-31 ENCOUNTER — Telehealth: Payer: Self-pay | Admitting: General Practice

## 2021-08-31 LAB — LEGIONELLA PNEUMOPHILA SEROGP 1 UR AG: L. pneumophila Serogp 1 Ur Ag: NEGATIVE

## 2021-08-31 NOTE — Telephone Encounter (Signed)
Transition Care Management Follow-up Telephone Call ?Date of discharge and from where: 08/29/21 from [facility] ?How have you been since you were released from the hospital? His wife called back and stated that they still don't have the referral for the home health. Patient is doing pretty good. His BP is running low (sitting 108/74) (90/81 standing). He has a follow up scheduled for 09/06/21. Transferred to Dr. Zigmund Daniel assistant regarding [facility] referral. ?Any questions or concerns? No ? ?Items Reviewed: ?Did the pt receive and understand the discharge instructions provided? Yes  ?Medications obtained and verified? No  ?Other? No  ?Any new allergies since your discharge? No  ?Dietary orders reviewed? Yes ?Do you have support at home? Yes  ? ?Home Care and Equipment/Supplies: ?Were home health services ordered? Yes ?If so, what is the name of the agency? [facility]  ?Has the agency set up a time to come to the patient's home? yes ?Were any new equipment or medical supplies ordered?  No ?What is the name of the medical supply agency? N/a ?Were you able to get the supplies/equipment? not applicable ?Do you have any questions related to the use of the equipment or supplies? No ? ?Functional Questionnaire: (I = Independent and D = Dependent) ?ADLs: D ? ?Bathing/Dressing- D ? ?Meal Prep- D ? ?Eating- I ? ?Maintaining continence- D ? ?Transferring/Ambulation- D ? ?Managing Meds- I ? ?Follow up appointments reviewed: ? ?PCP Hospital f/u appt confirmed? Yes  Scheduled to see Dr. Zigmund Daniel on 09/06/2021. ?[facility] f/u appt confirmed? No   ?Are transportation arrangements needed? No  ?If their condition worsens, is the pt aware to call PCP or go to the Emergency Dept.? Yes ?Was the patient provided with contact information for the PCP's office or ED? Yes ?Was to pt encouraged to call back with questions or concerns? Yes  ?

## 2021-08-31 NOTE — Telephone Encounter (Signed)
Transition Care Management Unsuccessful Follow-up Telephone Call ? ?Date of discharge and from where:  08/29/21 from [facility] ? ?Attempts:  1st Attempt ? ?Reason for unsuccessful TCM follow-up call:  Left voice message ? ?  ?

## 2021-09-02 ENCOUNTER — Telehealth: Payer: Self-pay | Admitting: Neurology

## 2021-09-02 DIAGNOSIS — A403 Sepsis due to Streptococcus pneumoniae: Secondary | ICD-10-CM

## 2021-09-02 DIAGNOSIS — J189 Pneumonia, unspecified organism: Secondary | ICD-10-CM

## 2021-09-02 DIAGNOSIS — R052 Subacute cough: Secondary | ICD-10-CM

## 2021-09-02 DIAGNOSIS — R6520 Severe sepsis without septic shock: Secondary | ICD-10-CM

## 2021-09-02 LAB — CULTURE, BLOOD (ROUTINE X 2)
Culture: NO GROWTH
Culture: NO GROWTH
Special Requests: ADEQUATE

## 2021-09-02 NOTE — Telephone Encounter (Signed)
Patient's wife Anne Ng [phone]) called and left vm stating [facility] referral was received but they only got PT order and no nursing orders. She states the hospital informed her nursing was going to be ordered, but that we did not put nursing in our order.  ? ?I don't see a referral for Home Health in the system, was this initiated here?  ?

## 2021-09-03 NOTE — Telephone Encounter (Signed)
They will need written order.  ?New order placed, please send to [facility].  ? ?Called wife and made her aware we were adding.  ?

## 2021-09-06 ENCOUNTER — Ambulatory Visit (INDEPENDENT_AMBULATORY_CARE_PROVIDER_SITE_OTHER): Payer: Medicare Other

## 2021-09-06 ENCOUNTER — Encounter: Payer: Self-pay | Admitting: Family Medicine

## 2021-09-06 ENCOUNTER — Ambulatory Visit (INDEPENDENT_AMBULATORY_CARE_PROVIDER_SITE_OTHER): Payer: Medicare Other | Admitting: Family Medicine

## 2021-09-06 VITALS — BP 95/61 | HR 78 | Ht 73.0 in | Wt 254.0 lb

## 2021-09-06 DIAGNOSIS — R0602 Shortness of breath: Secondary | ICD-10-CM

## 2021-09-06 DIAGNOSIS — J849 Interstitial pulmonary disease, unspecified: Secondary | ICD-10-CM | POA: Diagnosis not present

## 2021-09-06 DIAGNOSIS — I951 Orthostatic hypotension: Secondary | ICD-10-CM

## 2021-09-06 DIAGNOSIS — N183 Chronic kidney disease, stage 3 unspecified: Secondary | ICD-10-CM

## 2021-09-06 DIAGNOSIS — I959 Hypotension, unspecified: Secondary | ICD-10-CM | POA: Diagnosis not present

## 2021-09-06 DIAGNOSIS — Z8616 Personal history of COVID-19: Secondary | ICD-10-CM | POA: Diagnosis not present

## 2021-09-06 DIAGNOSIS — Z8701 Personal history of pneumonia (recurrent): Secondary | ICD-10-CM | POA: Diagnosis not present

## 2021-09-06 IMAGING — DX DG CHEST 2V
2 series · 2 of 2 positions shown · non-contrast
Comparison: Chest x-ray [DATE].

CLINICAL DATA: History of pneumonia. History of COVID. Shortness of
breath.

EXAM:
CHEST - 2 VIEW

[chest pa]
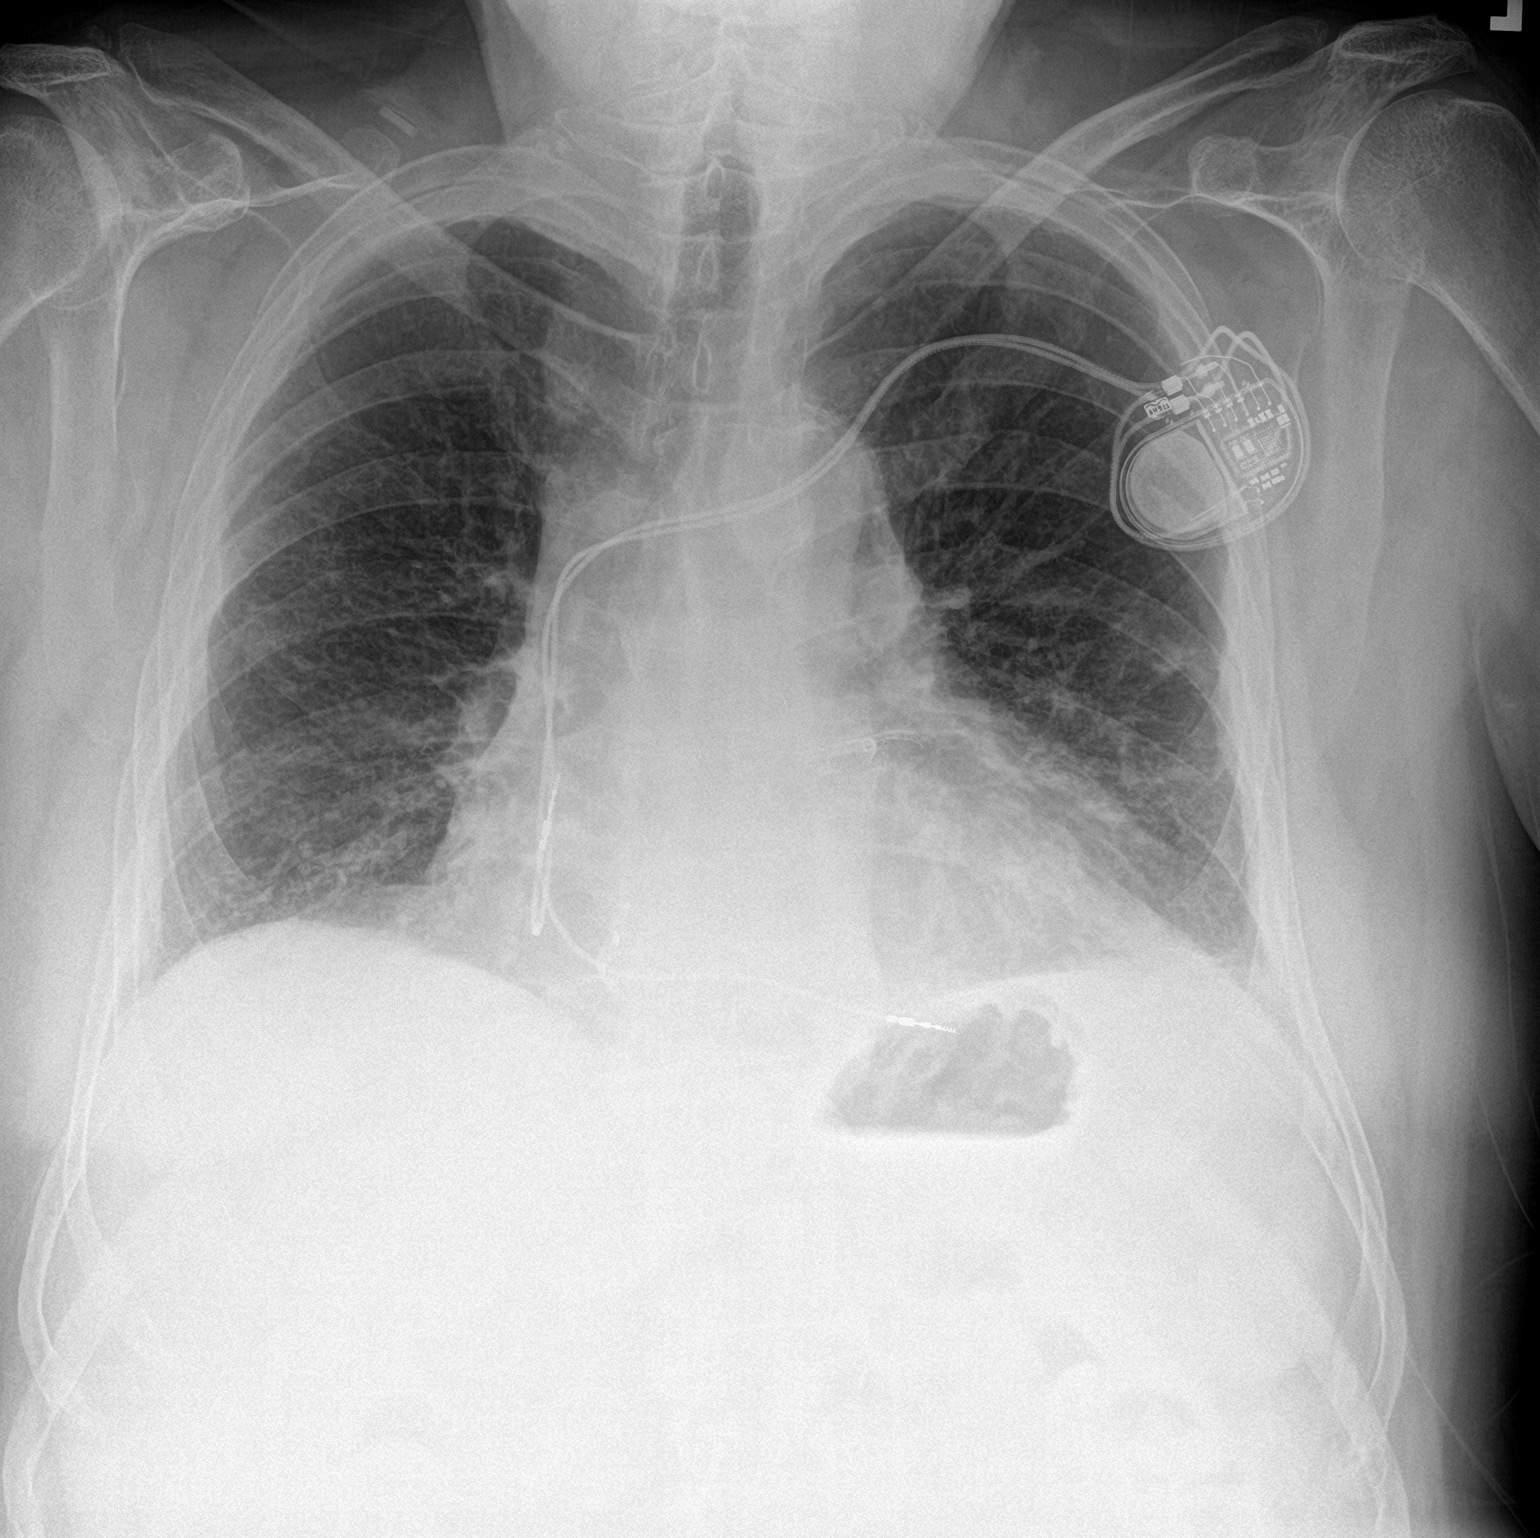

[chest lat]
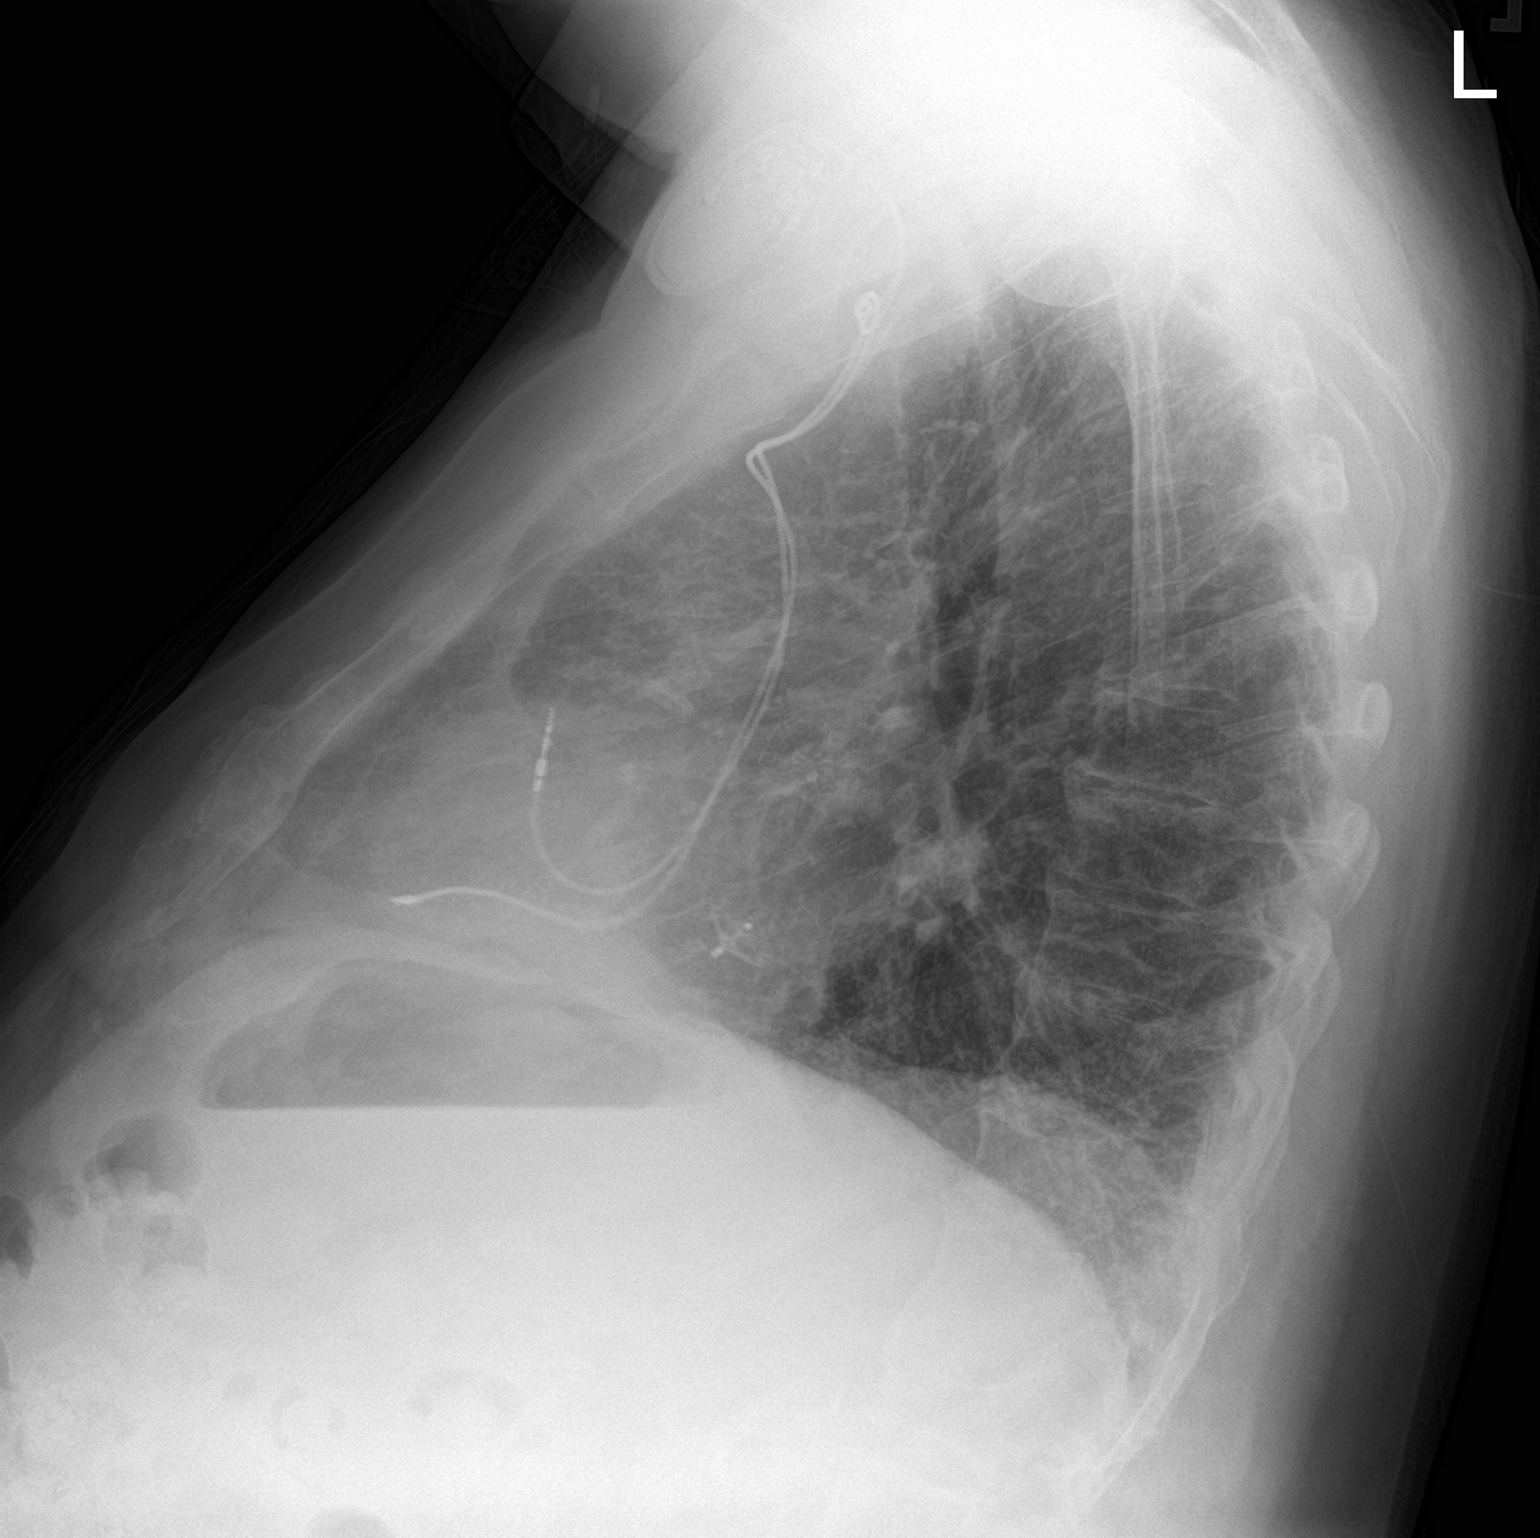

[2 of 2 positions shown; findings below may reference images not displayed]

FINDINGS: Cardiac pacer with lead tips over the right atrium and right
ventricle. Coronary artery calcification. Cardiomegaly. No pulmonary
venous congestion. Low lung volumes with bibasilar atelectasis. Mild
bibasilar infiltrates cannot be excluded. No pleural effusion or
pneumothorax. Mild bilateral pleural thickening suggesting scarring.
Degenerative changes both shoulders and thoracic spine. No acute
bony abnormality identified. Small linear radiopacity noted over the
right supraclavicular region. This may be outside of the patient. A
small foreign body such as a small catheter fragment cannot be
completely excluded.
IMPRESSION: 1. Cardiac pacer noted with lead tips in right atrium and right
ventricle. Coronary artery calcification. Cardiomegaly. No pulmonary
venous congestion.

2. Low lung volumes with bibasilar atelectasis. Mild bibasilar
infiltrates cannot be excluded.

3. Small linear radiopacity noted over the right supraclavicular
region. This may be outside of the patient. A small foreign body
such as a small catheter fragment cannot be completely excluded.

## 2021-09-06 MED ORDER — MOMETASONE FURO-FORMOTEROL FUM 100-5 MCG/ACT IN AERO: 2.0000 | INHALATION_SPRAY | Freq: Two times a day (BID) | RESPIRATORY_TRACT | 1 refills | Status: DC

## 2021-09-06 MED ORDER — HYDROCOD POLI-CHLORPHE POLI ER 10-8 MG/5ML PO SUER
5.0000 mL | Freq: Two times a day (BID) | ORAL | 0 refills | Status: DC | PRN
Start: 2021-09-06 — End: 2021-09-28

## 2021-09-06 NOTE — Progress Notes (Signed)
?Mark Thomas - 71 y.o. male MRN [id number]  Date of birth: 01/20/1951 ? ?Subjective ?No chief complaint on file. ? ? ?HPI ?Mark Thomas is 71 year old male here today for hospital follow-up.  He has had recurrent hospitalizations due to pneumonia/pulmonary issues and hypertension since having COVID in December.  He does have changes consistent with interstitial lung disease likely related to prior COVID infection.  He does have a follow-up with pulmonology.  Prior to his last discharge she was started on [facility] prescription for Symbicort.  So far doing okay with this.  He was also started on steroids prior to discharge.  He feels like as soon as he stops taking steroids his condition deteriorates and he becomes more hypotensive.  At this point he continues to have cough with some shortness of breath and fatigue.  States that he feels like this feels similar to when he has had pneumonia in the past. ? ?Continues to hypertension.  He is taking midodrine.  Florinef was added previously however discontinued during his hospitalization.  Blood pressure is low today and was low yesterday at home.  He does have significant orthostasis with this as well.  He denies chest pain or palpitations. ? ?ROS:  A comprehensive ROS was completed and negative except as noted per HPI ? ?Allergies  ?Allergen Reactions  ? Dilaudid [Hydromorphone] Other (See Comments)  ?  Respiratory Arrest!!!!  ? Bactrim [Sulfamethoxazole-Trimethoprim] Rash  ? Benadryl [Diphenhydramine] Other (See Comments)  ?  Muscle spasms  ? Phenobarbital Other (See Comments)  ?  From childhood- reaction not recalled at this time  ? Penicillins Other (See Comments)  ?  From childhood- reaction not recalled at this time  ? ? ?Past Medical History:  ?Diagnosis Date  ? Anxiety   ? Coronary artery disease   ? Depression   ? Essential tremor   ? GERD (gastroesophageal reflux disease)   ? High cholesterol   ? Hypertension   ? Myocardial infarct [facility])   ? Orthostatic hypotension    ? Stroke [facility])   ? Vascular dementia ([place])   ? ? ?Past Surgical History:  ?Procedure Laterality Date  ? APPENDECTOMY    ? CARDIAC SURGERY    ? CHOLECYSTECTOMY    ? HERNIA REPAIR    ? NASAL SINUS SURGERY    ? SHOULDER SURGERY    ? ? ?Social History  ? ?Socioeconomic History  ? Marital status: Married  ?  Spouse name: Mark Thomas  ? Number of children: 2  ? Years of education: 82  ? Highest education level: Master's degree (e.g., MA, MS, MEng, MEd, MSW, MBA)  ?Occupational History  ? Occupation: Retired  ?Tobacco Use  ? Smoking status: Never  ?  Passive exposure: Never  ? Smokeless tobacco: Never  ?Vaping Use  ? Vaping Use: Never used  ?Substance and Sexual Activity  ? Alcohol use: Not Currently  ? Drug use: Never  ? Sexual activity: Not on file  ?Other Topics Concern  ? Not on file  ?Social History Narrative  ? Lives with his wife. He has vascular dementia. His wife is his primary caretaker.He enjoys watching football and spending time with his grandchildren.  ? ?Social Determinants of Health  ? ?Financial Resource Strain: Low Risk   ? Difficulty of Paying Living Expenses: Not hard at all  ?Food Insecurity: No Food Insecurity  ? Worried About Charity fundraiser in the Last Year: Never true  ? Ran Out of Food in the Last Year: Never  true  ?Transportation Needs: No Transportation Needs  ? Lack of Transportation (Medical): No  ? Lack of Transportation (Non-Medical): No  ?Physical Activity: Insufficiently Active  ? Days of Exercise per Week: 2 days  ? Minutes of Exercise per Session: 50 min  ?Stress: No Stress Concern Present  ? Feeling of Stress : Not at all  ?Social Connections: Socially Integrated  ? Frequency of Communication with Friends and Family: More than three times a week  ? Frequency of Social Gatherings with Friends and Family: Once a week  ? Attends Religious Services: More than 4 times per year  ? Active Member of Clubs or Organizations: Yes  ? Attends Archivist Meetings: More than 4 times  per year  ? Marital Status: Married  ? ? ?Family History  ?Problem Relation Age of Onset  ? Hypertension Mother   ? Stroke Mother   ? Stroke Father   ? Hypertension Father   ? ? ?Health Maintenance  ?Topic Date Due  ? COVID-19 Vaccine (4 - Booster for Moderna series) 12/31/2021 (Originally 06/11/2020)  ? Zoster Vaccines- Shingrix (1 of 2) 12/31/2021 (Originally 12/22/1971)  ? Hepatitis C Screening  03/15/2022 (Originally 12/22/1970)  ? INFLUENZA VACCINE  11/30/2021  ? TETANUS/TDAP  09/19/2024  ? COLONOSCOPY (Pts 45-20yr Insurance coverage will need to be confirmed)  08/09/2025  ? Pneumonia Vaccine 71 Years old  Completed  ? HPV VACCINES  Aged Out  ? ? ? ?----------------------------------------------------------------------------------------------------------------------------------------------------------------------------------------------------------------- ?Physical Exam ?BP 95/61 (BP Location: Left Arm, Patient Position: Sitting, Cuff Size: Normal)   Pulse 78   Ht _0  (1.854 m)   Wt 254 lb (115.2 kg)   SpO2 96%   BMI 33.51 kg/m?  ? ?Physical Exam ?Constitutional:   ?   Appearance: Normal appearance.  ?Eyes:  ?   General: No scleral icterus. ?Cardiovascular:  ?   Rate and Rhythm: Normal rate and regular rhythm.  ?Pulmonary:  ?   Effort: Pulmonary effort is normal.  ?   Breath sounds: Normal breath sounds.  ?Musculoskeletal:  ?   Cervical back: Neck supple.  ?Neurological:  ?   Mental Status: He is alert.  ?Psychiatric:     ?   Mood and Affect: Mood normal.     ?   Behavior: Behavior normal.  ? ? ?------------------------------------------------------------------------------------------------------------------------------------------------------------------------------------------------------------------- ?Assessment and Plan ? ?CKD (chronic kidney disease) stage 3, GFR 30-59 ml/min (HCC) ?Repeat BMP and CBC today. ? 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  Has upcoming visit with pulmonology.  [facility]  Updated prescription sent in.  Continue nebulized albuterol or MDI as needed.  We will repeat chest x-ray today as he feels a little more dyspneic. ? ?Orthostatic hypotension ?Concern for adrenal insufficiency given that he has had multiple rounds of steroids.  Blood pressure seems to improve during use of steroids for acute illnesses.  He has been off steroids once again for a couple days and is now hypotensive again.  We will continue midodrine and add back on Florinef at 0.1 mg.  We will check cortisol levels. ? ? ?Meds ordered this encounter  ?Medications  ? mometasone-formoterol (DULERA) 100-5 MCG/ACT AERO  ?  Sig: Inhale 2 puffs into the lungs 2 (two) times daily.  ?  Dispense:  13 g  ?  Refill:  1  ? chlorpheniramine-HYDROcodone (TUSSIONEX PENNKINETIC ER) 10-8 MG/5ML  ?  Sig: Take 5 mLs by mouth every 12 (twelve) hours as needed for cough.  ?  Dispense:  115 mL  ?  Refill:  0  ? ? ?No follow-ups on file. ? ? ? ?This visit occurred during the SARS-CoV-2 public health emergency.  Safety protocols were in place, including screening questions prior to the visit, additional usage of staff PPE, and extensive cleaning of exam room while observing appropriate contact time as indicated for disinfecting solutions.  ? ?

## 2021-09-06 NOTE — Assessment & Plan Note (Signed)
Concern for adrenal insufficiency given that he has had multiple rounds of steroids.  Blood pressure seems to improve during use of steroids for acute illnesses.  He has been off steroids once again for a couple days and is now hypotensive again.  We will continue midodrine and add back on Florinef at 0.1 mg.  We will check cortisol levels. ?

## 2021-09-06 NOTE — Assessment & Plan Note (Signed)
Repeat BMP and CBC today. ?

## 2021-09-06 NOTE — Assessment & Plan Note (Signed)
He has been treated with multiple rounds of antibiotics and steroids since having COVID previously.  Has upcoming visit with pulmonology.  Changed to [facility] during recent hospitalization, will continue.  Updated prescription sent in.  Continue nebulized albuterol or MDI as needed.  We will repeat chest x-ray today as he feels a little more dyspneic. ?

## 2021-09-06 NOTE — Patient Instructions (Signed)
Try to have labs drawn between 8-9 am.  ?Stay well hydrated.  ?We'll be in touch with xray results.  ? ? ?

## 2021-09-07 ENCOUNTER — Telehealth: Payer: Self-pay

## 2021-09-07 ENCOUNTER — Encounter: Payer: Self-pay | Admitting: Family Medicine

## 2021-09-07 NOTE — Telephone Encounter (Signed)
Robin from [facility] left a vm msg on the Triage line. He wanted provider to be informed of today's home visit for physical therapy. Per Shirlean Mylar, patients' blood pressure has been fluctuating. A sitting blood pressure reading was at 128/65 and a standing blood pressure was at 74/50 soon after. Shirlean Mylar has postponed the patients' physical therapy until his blood pressure is stabilized. Shirlean Mylar wants to know if the provider has any recommendations? Pls advise, thanks.  ?

## 2021-09-09 LAB — CBC WITH DIFFERENTIAL/PLATELET
Absolute Monocytes: 695 cells/uL (ref 200–950)
Basophils Absolute: 26 cells/uL (ref 0–200)
Basophils Relative: 0.3 %
Eosinophils Absolute: 62 cells/uL (ref 15–500)
Eosinophils Relative: 0.7 %
HCT: 36.7 % — ABNORMAL LOW (ref 38.5–50.0)
Hemoglobin: 12.4 g/dL — ABNORMAL LOW (ref 13.2–17.1)
Lymphs Abs: 2130 cells/uL (ref 850–3900)
MCH: 33.7 pg — ABNORMAL HIGH (ref 27.0–33.0)
MCHC: 33.8 g/dL (ref 32.0–36.0)
MCV: 99.7 fL (ref 80.0–100.0)
MPV: 9.1 fL (ref 7.5–12.5)
Monocytes Relative: 7.9 %
Neutro Abs: 5887 cells/uL (ref 1500–7800)
Neutrophils Relative %: 66.9 %
Platelets: 211 10*3/uL (ref 140–400)
RBC: 3.68 10*6/uL — ABNORMAL LOW (ref 4.20–5.80)
RDW: 13.6 % (ref 11.0–15.0)
Total Lymphocyte: 24.2 %
WBC: 8.8 10*3/uL (ref 3.8–10.8)

## 2021-09-09 LAB — BASIC METABOLIC PANEL
BUN: 18 mg/dL (ref 7–25)
CO2: 22 mmol/L (ref 20–32)
Calcium: 8.8 mg/dL (ref 8.6–10.3)
Chloride: 105 mmol/L (ref 98–110)
Creat: 1.02 mg/dL (ref 0.70–1.35)
Glucose, Bld: 125 mg/dL — ABNORMAL HIGH (ref 65–99)
Potassium: 4.6 mmol/L (ref 3.5–5.3)
Sodium: 140 mmol/L (ref 135–146)

## 2021-09-09 LAB — CORTISOL: Cortisol, Plasma: 10.1 ug/dL

## 2021-09-09 NOTE — Telephone Encounter (Signed)
Attempted to contact physical therapist regarding providers' feedback. No answer. Left a detailed voicemail msg for Robin at [facility]. Direct call back info provided.  ?

## 2021-09-13 ENCOUNTER — Other Ambulatory Visit: Payer: Self-pay

## 2021-09-13 ENCOUNTER — Ambulatory Visit (INDEPENDENT_AMBULATORY_CARE_PROVIDER_SITE_OTHER): Payer: Medicare Other | Admitting: Cardiology

## 2021-09-13 ENCOUNTER — Encounter: Payer: Self-pay | Admitting: Cardiology

## 2021-09-13 VITALS — Ht 73.0 in | Wt 259.0 lb

## 2021-09-13 DIAGNOSIS — I480 Paroxysmal atrial fibrillation: Secondary | ICD-10-CM | POA: Diagnosis not present

## 2021-09-13 DIAGNOSIS — G459 Transient cerebral ischemic attack, unspecified: Secondary | ICD-10-CM

## 2021-09-13 DIAGNOSIS — I951 Orthostatic hypotension: Secondary | ICD-10-CM | POA: Diagnosis not present

## 2021-09-13 DIAGNOSIS — I2510 Atherosclerotic heart disease of native coronary artery without angina pectoris: Secondary | ICD-10-CM

## 2021-09-13 NOTE — Patient Instructions (Addendum)
Medication Instructions:  ? ?Fludrocortisone 0.26m -0.1444min the morning and 0.44m69mn the evening. ? ? ?Lab Work: ?Your physician recommends that you return for lab work in: 5 days ? ?You can come Monday through Friday 8:30 am to 12:00 pm and 1:15 to 4:30. You do not need to make an appointment as the order has already been placed. The labs you are going to have done are BMET.  ? ? ?If you have labs (blood work) drawn today and your tests are completely normal, you will receive your results only by: ?MyChart Message (if you have MyChart) OR ?A paper copy in the mail ?If you have any lab test that is abnormal or we need to change your treatment, we will call you to review the results. ? ? ?Testing/Procedures: ?None Ordered ? ? ?Follow-Up: ?[facility] and your [facility] needs are our priority.  As part of our continuing mission to provide you with exceptional heart care, we have created designated Provider Care Teams.  These Care Teams include your primary Cardiologist (physician) and Advanced Practice Providers (APPs -  Physician Assistants and Nurse Practitioners) who all work together to provide you with the care you need, when you need it. ? ?We recommend signing up for the patient portal called "MyChart".  Sign up information is provided on this After Visit Summary.  MyChart is used to connect with patients for Virtual Visits (Telemedicine).  Patients are able to view lab/test results, encounter notes, upcoming appointments, etc.  Non-urgent messages can be sent to your provider as well.   ?To learn more about what you can do with MyChart, go to [email or web address] ? ?Your next appointment:   ?3 month(s) ? ?The format for your next appointment:   ?In Person ? ?Provider:   ?RobJenne CampusD  ? ? ?Other Instructions ?NA  ?

## 2021-09-14 ENCOUNTER — Ambulatory Visit (INDEPENDENT_AMBULATORY_CARE_PROVIDER_SITE_OTHER): Payer: Medicare Other | Admitting: Family Medicine

## 2021-09-14 ENCOUNTER — Encounter: Payer: Self-pay | Admitting: Pulmonary Disease

## 2021-09-14 ENCOUNTER — Encounter: Payer: Self-pay | Admitting: Family Medicine

## 2021-09-14 ENCOUNTER — Ambulatory Visit: Payer: Medicare Other | Admitting: Cardiology

## 2021-09-14 ENCOUNTER — Ambulatory Visit (INDEPENDENT_AMBULATORY_CARE_PROVIDER_SITE_OTHER): Payer: Medicare Other | Admitting: Pulmonary Disease

## 2021-09-14 VITALS — BP 118/62 | HR 82 | Ht 73.0 in | Wt 263.0 lb

## 2021-09-14 VITALS — BP 122/76 | HR 84 | Ht 73.0 in | Wt 263.0 lb

## 2021-09-14 DIAGNOSIS — J189 Pneumonia, unspecified organism: Secondary | ICD-10-CM | POA: Diagnosis not present

## 2021-09-14 DIAGNOSIS — E871 Hypo-osmolality and hyponatremia: Secondary | ICD-10-CM | POA: Diagnosis not present

## 2021-09-14 DIAGNOSIS — B029 Zoster without complications: Secondary | ICD-10-CM | POA: Diagnosis not present

## 2021-09-14 DIAGNOSIS — J4530 Mild persistent asthma, uncomplicated: Secondary | ICD-10-CM

## 2021-09-14 MED ORDER — VALACYCLOVIR HCL 1 G PO TABS: 1000.0000 mg | ORAL_TABLET | Freq: Three times a day (TID) | ORAL | 0 refills | Status: AC

## 2021-09-14 MED ORDER — MOMETASONE FURO-FORMOTEROL FUM 200-5 MCG/ACT IN AERO: 2.0000 | INHALATION_SPRAY | Freq: Two times a day (BID) | RESPIRATORY_TRACT | 0 refills | Status: AC

## 2021-09-14 MED ORDER — METHYLPREDNISOLONE 4 MG PO TBPK: ORAL_TABLET | ORAL | 0 refills | Status: DC

## 2021-09-14 MED ORDER — HYDROCODONE-ACETAMINOPHEN 5-325 MG PO TABS
1.0000 | ORAL_TABLET | Freq: Four times a day (QID) | ORAL | 0 refills | Status: DC | PRN
Start: 2021-09-14 — End: 2021-09-28

## 2021-09-14 NOTE — Patient Instructions (Signed)
Start dulera 200-61mg 2 puffs twice daily ?- rinse mouth out after each use ? ?Continue albuterol nebulizer twice daily  ? ?Recommend sleeping with head of bed elevated to reduce nocturnal reflux issues ? ?Recommend no caffeine intake after 12pm  ? ?Recommend eating dinner/snacks at least 2 hours prior to bedtime ? ?Follow up in 3 months ?

## 2021-09-14 NOTE — Progress Notes (Signed)
Synopsis: Referred in May 2023 for ILD  Subjective:   PATIENT ID: Mark Thomas GENDER: male DOB: 1950/08/26, MRN: [id number]  HPI  Chief Complaint  Patient presents with   Hospitalization Follow-up    [facility]U for ILD. States his BP has been running low and high. Increased SOB, weakness and coughing. Non-productive cough.     Mark Thomas is a 71 year old male, never smoker with history of hypertension, coronary artery disease s/p stent, CVA, GERD, CKDIII, and paroxysmal atrial fibrillation s/p watchman device who is referred to pulmonary clinic for evaluation of interstitial lung disease.   He reports having respiratory issues since having covid 19 in 04/2021. He has progressive shortness of breath and cough. He reports significant in-activity since he started having strokes. He is also dealing with depression that leaves him fatigued. He has gained 15lbs in the past year. He and his wife report episodes of cough/choking when eating.    He has been on multiple rounds of antibiotics and prednisone with some improvement in his coughs symptoms but they return once courses have completed.    CT Chest 06/22/21 without contrast shows mild subpleural reticular changes, greatest in the lower lobes concerning for ILD.   Modified barium swallow 08/26/21 did not indicate aspiration risk. He does report on going issues with GERD. He is sleeping flat in bed. He does have caffeine intake after 12pm and is eating at times within a couple hours of bedtime.  He worked in [employer].  Past Medical History:  Diagnosis Date   Anxiety    Coronary artery disease    Depression    Essential tremor    GERD (gastroesophageal reflux disease)    High cholesterol    Hypertension    Myocardial infarct (HCC)    Orthostatic hypotension    Stroke ([place])    Vascular dementia ([place])      Family History  Problem Relation Age of Onset   Hypertension Mother    Stroke Mother    Stroke Father    Hypertension  Father      Social History   Socioeconomic History   Marital status: Married    Spouse name: Mark Thomas   Number of children: 2   Years of education: 20   Highest education level: Master's degree (e.g., MA, MS, MEng, MEd, MSW, MBA)  Occupational History   Occupation: Retired  Tobacco Use   Smoking status: Never    Passive exposure: Never   Smokeless tobacco: Never  Vaping Use   Vaping Use: Never used  Substance and Sexual Activity   Alcohol use: Not Currently   Drug use: Never   Sexual activity: Not on file  Other Topics Concern   Not on file  Social History Narrative   Lives with his wife. He has vascular dementia. His wife is his primary caretaker.He enjoys watching football and spending time with his grandchildren.   Social Determinants of Health   Financial Resource Strain: Low Risk    Difficulty of Paying Living Expenses: Not hard at all  Food Insecurity: No Food Insecurity   Worried About Charity fundraiser in the Last Year: Never true   [employer] in the Last Year: Never true  Transportation Needs: No Transportation Needs   Lack of Transportation (Medical): No   Lack of Transportation (Non-Medical): No  Physical Activity: Insufficiently Active   Days of Exercise per Week: 2 days   Minutes of Exercise per Session: 50 min  Stress: No Stress Concern Present   Feeling of Stress : Not at all  Social Connections: Socially Integrated   Frequency of Communication with Friends and Family: More than three times a week   Frequency of Social Gatherings with Friends and Family: Once a week   Attends Religious Services: More than 4 times per year   Active Member of [employer] or Organizations: Yes   Attends Music therapist: More than 4 times per year   Marital Status: Married  Human resources officer Violence: Not At Risk   Fear of Current or Ex-Partner: No   Emotionally Abused: No   Physically Abused: No   Sexually Abused: No     Allergies  Allergen  Reactions   Dilaudid [Hydromorphone] Other (See Comments)    Respiratory Arrest!!!!   Bactrim [Sulfamethoxazole-Trimethoprim] Rash   Benadryl [Diphenhydramine] Other (See Comments)    Muscle spasms   Phenobarbital Other (See Comments)    From childhood- reaction not recalled at this time   Penicillins Other (See Comments)    From childhood- reaction not recalled at this time     Outpatient Medications Prior to Visit  Medication Sig Dispense Refill   albuterol (PROVENTIL) (2.5 MG/3ML) 0.083% nebulizer solution Take 3 mLs (2.5 mg total) by nebulization every 6 (six) hours as needed for wheezing or shortness of breath. (Patient taking differently: Take 2.5 mg by nebulization See admin instructions. Nebulize 2.5 mg and inhale into the lungs every 4-6 hours as needed for wheezing or shortness of breath) 75 mL 12   albuterol (VENTOLIN HFA) 108 (90 Base) MCG/ACT inhaler Inhale 2 puffs into the lungs every 6 (six) hours as needed for wheezing or shortness of breath.     allopurinol (ZYLOPRIM) 100 MG tablet Take 100 mg by mouth daily.     aspirin 81 MG EC tablet Take 81 mg by mouth in the morning.     atorvastatin (LIPITOR) 80 MG tablet Take 80 mg by mouth daily.     b complex vitamins capsule Take 1 capsule by mouth daily.     chlorpheniramine-HYDROcodone (TUSSIONEX PENNKINETIC ER) 10-8 MG/5ML Take 5 mLs by mouth every 12 (twelve) hours as needed for cough. 115 mL 0   Cholecalciferol (VITAMIN D3) 10 MCG (400 UNIT) CAPS Take 400 Units by mouth in the morning.     clopidogrel (PLAVIX) 75 MG tablet Take 75 mg by mouth daily.     escitalopram (LEXAPRO) 20 MG tablet Take 1.5 tablets (30 mg total) by mouth daily. 135 tablet 0   FLUDROCORTISONE ACETATE PO Take 0.2 mg by mouth in the morning and at bedtime.     latanoprost (XALATAN) 0.005 % ophthalmic solution Place 1 drop into both eyes at bedtime.     magnesium oxide (MAG-OX) 400 MG tablet Take 400 mg by mouth daily.     midodrine (PROAMATINE) 5 MG  tablet Take 1 tablet (5 mg total) by mouth 3 (three) times daily with meals. 270 tablet 3   nitroGLYCERIN (NITROLINGUAL) 0.4 MG/SPRAY spray Place 1 spray under the tongue every 5 (five) minutes x 3 doses as needed for chest pain.     pantoprazole (PROTONIX) 40 MG tablet Take 1 tablet (40 mg total) by mouth every evening. 90 tablet 3   potassium citrate (UROCIT-K) 10 MEQ (1080 MG) SR tablet Take 10 mEq by mouth daily.     primidone (MYSOLINE) 50 MG tablet Take 2 tablets (100 mg total) by mouth 2 (two) times daily. 360 tablet 0   ranolazine (  RANEXA) 1000 MG SR tablet Take 1,000 mg by mouth in the morning and at bedtime.     triamcinolone ointment (KENALOG) 0.1 % Apply 1 application. topically as needed (to affected areas- for psoriasis flares).     mometasone-formoterol (DULERA) 100-5 MCG/ACT AERO Inhale 2 puffs into the lungs 2 (two) times daily. 13 g 1   ferrous sulfate 325 (65 FE) MG EC tablet Take 1 tablet (325 mg total) by mouth in the morning and at bedtime. 180 tablet 1   memantine (NAMENDA) 10 MG tablet Take 1 tablet (10 mg total) by mouth 2 (two) times daily. (Patient taking differently: Take 10 mg by mouth in the morning and at bedtime.) 180 tablet 0   QUEtiapine (SEROQUEL) 50 MG tablet Take 2 tablets (100 mg total) by mouth at bedtime. 180 tablet 0   rOPINIRole (REQUIP XL) 4 MG 24 hr tablet Take 1 tablet (4 mg total) by mouth at bedtime. 90 tablet 0   No facility-administered medications prior to visit.   Review of Systems  Constitutional:  Negative for chills, fever, malaise/fatigue and weight loss.  HENT:  Negative for congestion, sinus pain and sore throat.   Eyes: Negative.   Respiratory:  Positive for cough, shortness of breath and wheezing. Negative for hemoptysis and sputum production.   Cardiovascular:  Negative for chest pain, palpitations, orthopnea, claudication and leg swelling.  Gastrointestinal:  Positive for heartburn. Negative for abdominal pain, nausea and vomiting.   Genitourinary: Negative.   Musculoskeletal:  Negative for joint pain and myalgias.  Skin:  Negative for rash.  Neurological:  Negative for weakness.  Endo/Heme/Allergies: Negative.   Psychiatric/Behavioral: Negative.     Objective:   Vitals:   09/14/21 0915  BP: 118/62  Pulse: 82  SpO2: 100%  Weight: 263 lb (119.3 kg)  Height: _0  (1.854 m)    Physical Exam Constitutional:      General: He is not in acute distress.    Appearance: He is obese.  HENT:     Head: Normocephalic and atraumatic.  Eyes:     Extraocular Movements: Extraocular movements intact.     Conjunctiva/sclera: Conjunctivae normal.     Pupils: Pupils are equal, round, and reactive to light.  Cardiovascular:     Rate and Rhythm: Normal rate and regular rhythm.     Pulses: Normal pulses.     Heart sounds: Normal heart sounds. No murmur heard. Pulmonary:     Effort: Pulmonary effort is normal.     Breath sounds: Normal breath sounds. No wheezing, rhonchi or rales.  Abdominal:     General: Bowel sounds are normal.     Palpations: Abdomen is soft.  Musculoskeletal:     Right lower leg: No edema.     Left lower leg: No edema.  Lymphadenopathy:     Cervical: No cervical adenopathy.  Skin:    General: Skin is warm and dry.  Neurological:     General: No focal deficit present.     Mental Status: He is alert.  Psychiatric:        Mood and Affect: Mood normal.        Behavior: Behavior normal.        Thought Content: Thought content normal.        Judgment: Judgment normal.   CBC    Component Value Date/Time   WBC 8.8 09/08/2021 0000   RBC 3.68 (L) 09/08/2021 0000   HGB 12.4 (L) 09/08/2021 0000   HCT 36.7 (L) 09/08/2021 0000  PLT 211 09/08/2021 0000   MCV 99.7 09/08/2021 0000   MCH 33.7 (H) 09/08/2021 0000   MCHC 33.8 09/08/2021 0000   RDW 13.6 09/08/2021 0000   LYMPHSABS 2,130 09/08/2021 0000   MONOABS 0.9 08/26/2021 1241   EOSABS 62 09/08/2021 0000   BASOSABS 26 09/08/2021 0000       Latest Ref Rng & Units 09/08/2021   12:00 AM 08/27/2021    1:05 AM 08/26/2021   12:41 PM  BMP  Glucose 65 - 99 mg/dL 125   110   163    BUN 7 - 25 mg/dL _0 Creatinine 0.70 - 1.35 mg/dL 1.02   1.10   1.24    BUN/Creat Ratio 6 - 22 (calc) NOT APPLICABLE      Sodium 071 - 146 mmol/L 140   136   134    Potassium 3.5 - 5.3 mmol/L 4.6   3.9   4.5    Chloride 98 - 110 mmol/L 105   102   100    CO2 20 - 32 mmol/L _1 Calcium 8.6 - 10.3 mg/dL 8.8   8.3   8.4     Chest imaging: CXR 09/06/21 1. Cardiac pacer noted with lead tips in right atrium and right ventricle. Coronary artery calcification. Cardiomegaly. No pulmonary venous congestion.   2. Low lung volumes with bibasilar atelectasis. Mild bibasilar infiltrates cannot be excluded.   3. Small linear radiopacity noted over the right supraclavicular region. This may be outside of the patient. A small foreign body such as a small catheter fragment cannot be completely excluded  CTA Chest 08/04/21 1. No evidence for pulmonary embolism. Tree-in-bud and patchy ground-glass opacities in the bilateral upper lobes compatible with infection/inflammation. 2. Stable mild peripheral interstitial opacities in the lower lung predominance, likely interstitial lung disease. 3. Stable cardiomegaly. 4. Mildly hyperdense areas in the kidneys may represent proteinaceous cysts. Other etiologies can not be excluded. This can be further evaluated with ultrasound or MRI. 5. Small hiatal hernia. 6.  Aortic Atherosclerosis   PFT:     View : No data to display.          Labs: 08/06/21: ANA, CCP abs, RA factor, ANCA, SSA/SSB - negative  Path:  Echo 08/27/21: Moderate aortic stenosis. EF 70-75%, LV hyperdynamic. Grade II diastolic dysfunction. RV systolic function is normal. RVSP 49.73mHg. LA moderately dilated.   Heart Catheterization:  Modified Barium Swallow 08/26/21 Patient presents with functional oropharyngeal swallow  without aspiration or penetration of all consisencies tested *thin, nectar, puree, cracker and tablet with thin.  Swallow was timely and strong without any retention. Pt advises he does not use a straw due to it causing him to "choke" since his CVA.  Barium tablet appeared to transit through esophagus without delay.  Reviewed MBS with pt.  Pt does endorse reflux issues that may contribute to reflux.  No SLP follow up indicated.     Assessment & Plan:   Recurrent pneumonia  Mild persistent reactive airway disease without complication - Plan: mometasone-formoterol (DULERA) 200-5 MCG/ACT AERO  Discussion: TWilliard Kelleris a 71year old male, never smoker with history of hypertension, coronary artery disease s/p stent, CVA, GERD, CKDIII, and paroxysmal atrial fibrillation s/p watchman device who is referred to pulmonary clinic for evaluation of interstitial lung disease.   CT Chest 06/22/21 without contrast shows mild subpleural reticular changes, greatest in the lower  lobes concerning for ILD.   Inflammatory workup from recent hospitalization is unrevealing.   He has ongoing GERD/reflux disease which I think is contributing to his respiratory symptoms and mild ILD pattern on chest imaging. Fortunately based on speech eval in the hospital he is not having frank aspiration events.  Recommended sleeping with the head of the bed elevated, avoiding caffeine after 12 pm and eating at least 2 hours prior to bedtime.   He is to increase his dulera dosage to 200-79mg 2 puffs twice daily and monitor for further improvement in his symptoms.  We discussed increasing his physical activity and working on weight loss.   Follow up in 3 months.   JFreda Jackson MD [facility]Pulmonary & Critical Care Office: [phone]  Current Outpatient Medications:    albuterol (PROVENTIL) (2.5 MG/3ML) 0.083% nebulizer solution, Take 3 mLs (2.5 mg total) by nebulization every 6 (six) hours as needed for wheezing or  shortness of breath. (Patient taking differently: Take 2.5 mg by nebulization See admin instructions. Nebulize 2.5 mg and inhale into the lungs every 4-6 hours as needed for wheezing or shortness of breath), Disp: 75 mL, Rfl: 12   albuterol (VENTOLIN HFA) 108 (90 Base) MCG/ACT inhaler, Inhale 2 puffs into the lungs every 6 (six) hours as needed for wheezing or shortness of breath., Disp: , Rfl:    allopurinol (ZYLOPRIM) 100 MG tablet, Take 100 mg by mouth daily., Disp: , Rfl:    aspirin 81 MG EC tablet, Take 81 mg by mouth in the morning., Disp: , Rfl:    atorvastatin (LIPITOR) 80 MG tablet, Take 80 mg by mouth daily., Disp: , Rfl:    b complex vitamins capsule, Take 1 capsule by mouth daily., Disp: , Rfl:    chlorpheniramine-HYDROcodone (TUSSIONEX PENNKINETIC ER) 10-8 MG/5ML, Take 5 mLs by mouth every 12 (twelve) hours as needed for cough., Disp: 115 mL, Rfl: 0   Cholecalciferol (VITAMIN D3) 10 MCG (400 UNIT) CAPS, Take 400 Units by mouth in the morning., Disp: , Rfl:    clopidogrel (PLAVIX) 75 MG tablet, Take 75 mg by mouth daily., Disp: , Rfl:    escitalopram (LEXAPRO) 20 MG tablet, Take 1.5 tablets (30 mg total) by mouth daily., Disp: 135 tablet, Rfl: 0   FLUDROCORTISONE ACETATE PO, Take 0.2 mg by mouth in the morning and at bedtime., Disp: , Rfl:    latanoprost (XALATAN) 0.005 % ophthalmic solution, Place 1 drop into both eyes at bedtime., Disp: , Rfl:    magnesium oxide (MAG-OX) 400 MG tablet, Take 400 mg by mouth daily., Disp: , Rfl:    midodrine (PROAMATINE) 5 MG tablet, Take 1 tablet (5 mg total) by mouth 3 (three) times daily with meals., Disp: 270 tablet, Rfl: 3   mometasone-formoterol (DULERA) 200-5 MCG/ACT AERO, Inhale 2 puffs into the lungs 2 (two) times daily., Disp: 1 each, Rfl: 0   nitroGLYCERIN (NITROLINGUAL) 0.4 MG/SPRAY spray, Place 1 spray under the tongue every 5 (five) minutes x 3 doses as needed for chest pain., Disp: , Rfl:    pantoprazole (PROTONIX) 40 MG tablet, Take 1  tablet (40 mg total) by mouth every evening., Disp: 90 tablet, Rfl: 3   potassium citrate (UROCIT-K) 10 MEQ (1080 MG) SR tablet, Take 10 mEq by mouth daily., Disp: , Rfl:    primidone (MYSOLINE) 50 MG tablet, Take 2 tablets (100 mg total) by mouth 2 (two) times daily., Disp: 360 tablet, Rfl: 0   ranolazine (RANEXA) 1000 MG SR tablet, Take 1,000  mg by mouth in the morning and at bedtime., Disp: , Rfl:    triamcinolone ointment (KENALOG) 0.1 %, Apply 1 application. topically as needed (to affected areas- for psoriasis flares)., Disp: , Rfl:    ferrous sulfate 325 (65 FE) MG EC tablet, Take 1 tablet (325 mg total) by mouth in the morning and at bedtime., Disp: 180 tablet, Rfl: 1   HYDROcodone-acetaminophen (NORCO) 5-325 MG tablet, Take 1 tablet by mouth every 6 (six) hours as needed for moderate pain., Disp: 30 tablet, Rfl: 0   lidocaine (LIDODERM) 5 %, Place 1 patch onto the skin daily. Remove & Discard patch within 12 hours or as directed by MD, Disp: 30 patch, Rfl: 0   memantine (NAMENDA) 10 MG tablet, Take 1 tablet (10 mg total) by mouth 2 (two) times daily. (Patient taking differently: Take 10 mg by mouth in the morning and at bedtime.), Disp: 180 tablet, Rfl: 0   methylPREDNISolone (MEDROL DOSEPAK) 4 MG TBPK tablet, Taper as directed on packaging, Disp: 21 tablet, Rfl: 0   QUEtiapine (SEROQUEL) 50 MG tablet, Take 2 tablets (100 mg total) by mouth at bedtime., Disp: 180 tablet, Rfl: 0   rOPINIRole (REQUIP XL) 4 MG 24 hr tablet, Take 1 tablet (4 mg total) by mouth at bedtime., Disp: 90 tablet, Rfl: 0   valACYclovir (VALTREX) 1000 MG tablet, Take 1 tablet (1,000 mg total) by mouth 3 (three) times daily for 10 days., Disp: 30 tablet, Rfl: 0

## 2021-09-14 NOTE — Assessment & Plan Note (Signed)
Starting valtrex 1g tid x10 days.  He is having quite a bit of pain and will also add medrol dosepak (he prefers this over prednisone) and norco 5/325 short term.  He may use topical capsaicin to intact skin if needed.  Discussed adding lidoderm if needed as well.  ?

## 2021-09-14 NOTE — Addendum Note (Signed)
Addended by: Perlie Mayo on: 09/14/2021 01:42 PM ? ? Modules accepted: Orders ? ?

## 2021-09-14 NOTE — Progress Notes (Signed)
?Mark Thomas - 71 y.o. male MRN [id number]  Date of birth: [birth year] ? ?Subjective ?No chief complaint on file. ? ? ?HPI ?Mark Thomas is a 71 y.o. male here today with complaint of rash.  He noticed rash located on the back of the arm.  Symptoms started 2 days ago.  He has had shingles in the past and this feels similar.  No drainage from areas.  Denies fever or chills.  ? ?ROS:  A comprehensive ROS was completed and negative except as noted per HPI ? ?Allergies  ?Allergen Reactions  ? Dilaudid [Hydromorphone] Other (See Comments)  ?  Respiratory Arrest!!!!  ? Bactrim [Sulfamethoxazole-Trimethoprim] Rash  ? Benadryl [Diphenhydramine] Other (See Comments)  ?  Muscle spasms  ? Phenobarbital Other (See Comments)  ?  From childhood- reaction not recalled at this time  ? Penicillins Other (See Comments)  ?  From childhood- reaction not recalled at this time  ? ? ?Past Medical History:  ?Diagnosis Date  ? Anxiety   ? Coronary artery disease   ? Depression   ? Essential tremor   ? GERD (gastroesophageal reflux disease)   ? High cholesterol   ? Hypertension   ? Myocardial infarct [facility])   ? Orthostatic hypotension   ? Stroke [facility])   ? Vascular dementia ([place])   ? ? ?Past Surgical History:  ?Procedure Laterality Date  ? APPENDECTOMY    ? CARDIAC SURGERY    ? CHOLECYSTECTOMY    ? HERNIA REPAIR    ? NASAL SINUS SURGERY    ? SHOULDER SURGERY    ? ? ?Social History  ? ?Socioeconomic History  ? Marital status: Married  ?  Spouse name: Rollie Hynek  ? Number of children: 2  ? Years of education: 31  ? Highest education level: Master's degree (e.g., MA, MS, MEng, MEd, MSW, MBA)  ?Occupational History  ? Occupation: Retired  ?Tobacco Use  ? Smoking status: Never  ?  Passive exposure: Never  ? Smokeless tobacco: Never  ?Vaping Use  ? Vaping Use: Never used  ?Substance and Sexual Activity  ? Alcohol use: Not Currently  ? Drug use: Never  ? Sexual activity: Not on file  ?Other Topics Concern  ? Not on file  ?Social History  Narrative  ? Lives with his wife. He has vascular dementia. His wife is his primary caretaker.He enjoys watching football and spending time with his grandchildren.  ? ?Social Determinants of Health  ? ?Financial Resource Strain: Low Risk   ? Difficulty of Paying Living Expenses: Not hard at all  ?Food Insecurity: No Food Insecurity  ? Worried About Charity fundraiser in the Last Year: Never true  ? Ran Out of Food in the Last Year: Never true  ?Transportation Needs: No Transportation Needs  ? Lack of Transportation (Medical): No  ? Lack of Transportation (Non-Medical): No  ?Physical Activity: Insufficiently Active  ? Days of Exercise per Week: 2 days  ? Minutes of Exercise per Session: 50 min  ?Stress: No Stress Concern Present  ? Feeling of Stress : Not at all  ?Social Connections: Socially Integrated  ? Frequency of Communication with Friends and Family: More than three times a week  ? Frequency of Social Gatherings with Friends and Family: Once a week  ? Attends Religious Services: More than 4 times per year  ? Active Member of Clubs or Organizations: Yes  ? Attends Archivist Meetings: More than 4 times per year  ? Marital  Status: Married  ? ? ?Family History  ?Problem Relation Age of Onset  ? Hypertension Mother   ? Stroke Mother   ? Stroke Father   ? Hypertension Father   ? ? ?Health Maintenance  ?Topic Date Due  ? COVID-19 Vaccine (4 - Booster for Moderna series) 12/31/2021 (Originally 06/11/2020)  ? Zoster Vaccines- Shingrix (1 of 2) 12/31/2021 (Originally 12/22/1969)  ? Hepatitis C Screening  03/15/2022 (Originally 12/22/1968)  ? INFLUENZA VACCINE  11/30/2021  ? TETANUS/TDAP  09/19/2024  ? COLONOSCOPY (Pts 45-16yr Insurance coverage will need to be confirmed)  08/09/2025  ? Pneumonia Vaccine 71 Years old  Completed  ? HPV VACCINES  Aged Out   ? ? ? ?----------------------------------------------------------------------------------------------------------------------------------------------------------------------------------------------------------------- ?Physical Exam ?BP 122/76 (BP Location: Right Arm, Patient Position: Sitting, Cuff Size: Large)   Pulse 84   Ht _0  (1.854 m)   Wt 263 lb (119.3 kg)   SpO2 95%   BMI 34.70 kg/m?  ? ?Physical Exam ?Constitutional:   ?   Appearance: Normal appearance.  ?Musculoskeletal:  ?   Comments: Vesicular appearing rash along the arm in t1 distribution on the left side.   ?Neurological:  ?   Mental Status: He is alert.  ? ? ?------------------------------------------------------------------------------------------------------------------------------------------------------------------------------------------------------------------- ?Assessment and Plan ? ?Shingles ?Starting valtrex 1g tid x10 days.  He is having quite a bit of pain and will also add medrol dosepak (he prefers this over prednisone) and norco 5/325 short term.  He may use topical capsaicin to intact skin if needed.  Discussed adding lidoderm if needed as well.  ? ? ?No orders of the defined types were placed in this encounter. ? ? ?No follow-ups on file. ? ? ? ?This visit occurred during the SARS-CoV-2 public health emergency.  Safety protocols were in place, including screening questions prior to the visit, additional usage of staff PPE, and extensive cleaning of exam room while observing appropriate contact time as indicated for disinfecting solutions.  ? ? ?

## 2021-09-14 NOTE — Patient Instructions (Addendum)
You may use zostrix or capsaicin cream on the area if the skin is still intact.  ? ? ?Shingles ? ?Shingles, which is also known as herpes zoster, is an infection that causes a painful skin rash and fluid-filled blisters. It is caused by a virus. ?Shingles only develops in people who: ?Have had chickenpox. ?Have been vaccinated against chickenpox. Shingles is rare in this group. ?What are the causes? ?Shingles is caused by varicella-zoster virus. This is the same virus that causes chickenpox. After a person is exposed to the virus, it stays in the body in an inactive (dormant) state. Shingles develops if the virus is reactivated. This can happen many years after the first (initial) exposure to the virus. It is not known what causes this virus to be reactivated. ?What increases the risk? ?People who have had chickenpox or received the chickenpox vaccine are at risk for shingles. Shingles infection is more common in people who: ?Are older than 69 years of age. ?Have a weakened disease-fighting system (immune system), such as people with: ?HIV (human immunodeficiency virus). ?AIDS (acquired immunodeficiency syndrome). ?Cancer. ?Are taking medicines that weaken the immune system, such as organ transplant medicines. ?Are experiencing a lot of stress. ?What are the signs or symptoms? ?Early symptoms of this condition include itching, tingling, and pain in an area on your skin. Pain may be described as burning, stabbing, or throbbing. ?A few days or weeks after early symptoms start, a painful red rash appears. The rash is usually on one side of the body and has a band-like or belt-like pattern. The rash eventually turns into fluid-filled blisters that break open, change into scabs, and dry up in about 2-3 weeks. ?At any time during the infection, you may also develop: ?A fever. ?Chills. ?A headache. ?Nausea. ?How is this diagnosed? ?This condition is diagnosed with a skin exam. Skin or fluid samples (a culture) may be taken  from the blisters before a diagnosis is made. ?How is this treated? ?The rash may last for several weeks. There is not a specific cure for this condition. Your health care provider may prescribe medicines to help you manage pain, recover more quickly, and avoid long-term problems. Medicines may include: ?Antiviral medicines. ?Anti-inflammatory medicines. ?Pain medicines. ?Anti-itching medicines (antihistamines). ?If the area involved is on your face, you may be referred to a specialist, such as an eye doctor (ophthalmologist) or an ear, nose, and throat (ENT) doctor (otorhinolaryngologist) to help you avoid eye problems, chronic pain, or disability. ?Follow these instructions at home: ?Medicines ?Take over-the-counter and prescription medicines only as told by your health care provider. ?Apply an anti-itch cream or numbing cream to the affected area as told by your health care provider. ?Relieving itching and discomfort ? ?Apply cold, wet cloths (cold compresses) to the area of the rash or blisters as told by your health care provider. ?Cool baths can be soothing. Try adding baking soda or dry oatmeal to the water to reduce itching. Do not bathe in hot water. ?Use calamine lotion as recommended by your health care provider. This is an over-the-counter lotion that helps to relieve itchiness. ?Blister and rash care ?Keep your rash covered with a loose bandage (dressing). Wear loose-fitting clothing to help ease the pain of material rubbing against the rash. ?Wash your hands with soap and water for at least 20 seconds before and after you change your dressing. If soap and water are not available, use hand sanitizer. ?Change your dressing as told by your health care provider. ?Keep  your rash and blisters clean by washing the area with mild soap and cool water as told by your health care provider. ?Check your rash every day for signs of infection. Check for: ?More redness, swelling, or pain. ?Fluid or  blood. ?Warmth. ?Pus or a bad smell. ?Do not scratch your rash or pick at your blisters. To help avoid scratching: ?Keep your fingernails clean and cut short. ?Wear gloves or mittens while you sleep, if scratching is a problem. ?General instructions ?Rest as told by your health care provider. ?Wash your hands often with soap and water for at least 20 seconds. If soap and water are not available, use hand sanitizer. Doing this lowers your chance of getting a bacterial skin infection. ?Before your blisters change into scabs, your shingles infection can cause chickenpox in people who have never had it or have never been vaccinated against it. To prevent this from happening, avoid contact with other people, especially: ?Babies. ?Pregnant women. ?Children who have eczema. ?Older people who have transplants. ?People who have chronic illnesses, such as cancer or AIDS. ?Keep all follow-up visits. This is important. ?How is this prevented? ?Getting vaccinated is the best way to prevent shingles and protect against shingles complications. If you have not been vaccinated, talk with your health care provider about getting the vaccine. ?Where to find more information ?Centers for Disease Control and Prevention: http://www.wolf.info/ ?Contact a health care provider if: ?Your pain is not relieved with prescribed medicines. ?Your pain does not get better after the rash heals. ?You have any of these signs of infection: ?More redness, swelling, or pain around the rash. ?Fluid or blood coming from the rash. ?Warmth coming from your rash. ?Pus or a bad smell coming from the rash. ?A fever. ?Get help right away if: ?The rash is on your face or nose. ?You have facial pain, pain around your eye area, or loss of feeling on one side of your face. ?You have difficulty seeing. ?You have ear pain or have ringing in your ear. ?You have a loss of taste. ?Your condition gets worse. ?Summary ?Shingles, also known as herpes zoster, is an infection that  causes a painful skin rash and fluid-filled blisters. ?This condition is diagnosed with a skin exam. Skin or fluid samples (a culture) may be taken from the blisters. ?Keep your rash covered with a loose bandage (dressing). Wear loose-fitting clothing to help ease the pain of material rubbing against the rash. ?Before your blisters change into scabs, your shingles infection can cause chickenpox in people who have never had it or have never been vaccinated against it. ?This information is not intended to replace advice given to you by your health care provider. Make sure you discuss any questions you have with your health care provider. ?Document Revised: 04/13/2020 Document Reviewed: 04/13/2020 ?Elsevier Patient Education ? [place]. ? ?

## 2021-09-15 ENCOUNTER — Telehealth: Payer: Self-pay

## 2021-09-15 NOTE — Telephone Encounter (Signed)
Patients wife called stating patient is in a lot of pain and the shingles is spreading. Patient's wife would like to know if Dr. Zigmund Daniel can proceed with the Lidoderm patches?  ?

## 2021-09-16 MED ORDER — LIDOCAINE 5 % EX PTCH: 1.0000 | MEDICATED_PATCH | CUTANEOUS | 0 refills | Status: DC

## 2021-09-16 NOTE — Telephone Encounter (Signed)
Pt's spouse has been advised of Rx sent to pharmacy.

## 2021-09-16 NOTE — Addendum Note (Signed)
Addended by: Perlie Mayo on: 09/16/2021 12:42 PM   Modules accepted: Orders

## 2021-09-19 ENCOUNTER — Encounter: Payer: Self-pay | Admitting: Pulmonary Disease

## 2021-09-20 ENCOUNTER — Other Ambulatory Visit: Payer: Self-pay | Admitting: Neurology

## 2021-09-21 ENCOUNTER — Encounter (HOSPITAL_COMMUNITY): Payer: Self-pay | Admitting: Psychiatry

## 2021-09-21 ENCOUNTER — Telehealth (INDEPENDENT_AMBULATORY_CARE_PROVIDER_SITE_OTHER): Payer: Medicare Other | Admitting: Psychiatry

## 2021-09-21 DIAGNOSIS — F411 Generalized anxiety disorder: Secondary | ICD-10-CM | POA: Diagnosis not present

## 2021-09-21 DIAGNOSIS — I2510 Atherosclerotic heart disease of native coronary artery without angina pectoris: Secondary | ICD-10-CM

## 2021-09-21 DIAGNOSIS — F331 Major depressive disorder, recurrent, moderate: Secondary | ICD-10-CM

## 2021-09-21 DIAGNOSIS — F4323 Adjustment disorder with mixed anxiety and depressed mood: Secondary | ICD-10-CM

## 2021-09-21 MED ORDER — FLUOXETINE HCL 20 MG PO CAPS: 20.0000 mg | ORAL_CAPSULE | Freq: Every day | ORAL | 0 refills | Status: DC

## 2021-09-21 NOTE — Telephone Encounter (Signed)
Rx refilled as per last office visit note, "Increase Namenda to 10 mg twice daily".

## 2021-09-21 NOTE — Progress Notes (Signed)
[place] Follow up visit  Patient Identification: Mark Thomas MRN:  [id number] Date of Evaluation:  09/21/2021 Referral Source: pcp Chief Complaint:  establish care, depression Visit Diagnosis:    ICD-10-CM   1. Moderate episode of recurrent major depressive disorder (HCC)  F33.1     2. Adjustment disorder with mixed anxiety and depressed mood  F43.23     3. Anxiety state  F41.1       Virtual Visit via Video Note  I connected with [name] on 09/21/21 at 11:30 AM EDT by a video enabled telemedicine application and verified that I am speaking with the correct person using two identifiers.  Location: Patient: home Provider: office   I discussed the limitations of evaluation and management by telemedicine and the availability of in person appointments. The patient expressed understanding and agreed to proceed.      I discussed the assessment and treatment plan with the patient. The patient was provided an opportunity to ask questions and all were answered. The patient agreed with the plan and demonstrated an understanding of the instructions.   The patient was advised to call back or seek an in-person evaluation if the symptoms worsen or if the condition fails to improve as anticipated.  I provided 20 minutes of non-face-to-face time during this encounter.  History of Present Illness: Patient is a 71 years old currently married Caucasian male referred initially by primary care physician establish care for depression he has multiple medical comorbidities including history of stroke, coronary artery disease, vascular dementia  Has got shingles, took extra pain meds, now realizing not to be back on pain meds or ativan Wife is supportive, feels subdued at times  Wants to change lexapro  He is suffering from dementia is on nemanda by neurology Gets forgetfull in hte morning and causes anxiety Wants some thing for anxiety Discussed in detail of risk of increase med or benzo  and further effect to dementia  Has a supportive wife   Aggravating factors; medical comorbidities including stroke. Familhy and medical stressors  Modifying factors; wife Duration 10 years plus Severity : gets subdued    Past Psychiatric History: depression, alcohol dependence  Previous Psychotropic Medications: No   Substance Abuse History in the last 12 months:  No.  Consequences of Substance Abuse: NA  Past Medical History:  Past Medical History:  Diagnosis Date   Anxiety    Coronary artery disease    Depression    Essential tremor    GERD (gastroesophageal reflux disease)    High cholesterol    Hypertension    Myocardial infarct (HCC)    Orthostatic hypotension    Stroke ([place])    Vascular dementia ([place])     Past Surgical History:  Procedure Laterality Date   APPENDECTOMY     CARDIAC SURGERY     CHOLECYSTECTOMY     HERNIA REPAIR     NASAL SINUS SURGERY     SHOULDER SURGERY      Family Psychiatric History: mother : anxiety  Family History:  Family History  Problem Relation Age of Onset   Hypertension Mother    Stroke Mother    Stroke Father    Hypertension Father     Social History:   Social History   Socioeconomic History   Marital status: Married    Spouse name: Theodor Mustin   Number of children: 2   Years of education: 20   Highest education level: Master's degree (e.g., MA, MS, MEng, MEd, MSW,  MBA)  Occupational History   Occupation: Retired  Tobacco Use   Smoking status: Never    Passive exposure: Never   Smokeless tobacco: Never  Vaping Use   Vaping Use: Never used  Substance and Sexual Activity   Alcohol use: Not Currently   Drug use: Never   Sexual activity: Not on file  Other Topics Concern   Not on file  Social History Narrative   Lives with his wife. He has vascular dementia. His wife is his primary caretaker.He enjoys watching football and spending time with his grandchildren.   Social Determinants of Health    Financial Resource Strain: Low Risk    Difficulty of Paying Living Expenses: Not hard at all  Food Insecurity: No Food Insecurity   Worried About Charity fundraiser in the Last Year: Never true   [place] in the Last Year: Never true  Transportation Needs: No Transportation Needs   Lack of Transportation (Medical): No   Lack of Transportation (Non-Medical): No  Physical Activity: Insufficiently Active   Days of Exercise per Week: 2 days   Minutes of Exercise per Session: 50 min  Stress: No Stress Concern Present   Feeling of Stress : Not at all  Social Connections: Socially Integrated   Frequency of Communication with Friends and Family: More than three times a week   Frequency of Social Gatherings with Friends and Family: Once a week   Attends Religious Services: More than 4 times per year   Active Member of [employer] or Organizations: Yes   Attends Music therapist: More than 4 times per year   Marital Status: Married    Additional Social History: grew up with parens, mom had nerve problems or anxiety Currently married, retired Marine scientist, 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, supportive wife Allergies:   Allergies  Allergen Reactions   Dilaudid [Hydromorphone] Other (See Comments)    Respiratory Arrest!!!!   Bactrim [Sulfamethoxazole-Trimethoprim] Rash   Benadryl [Diphenhydramine] Other (See Comments)    Muscle spasms   Phenobarbital Other (See Comments)    From childhood- reaction not recalled at this time   Penicillins Other (See Comments)    From childhood- reaction not recalled at this time    Metabolic Disorder Labs: Lab Results  Component Value Date   HGBA1C 6.0 (H) 01/08/2021   MPG 125.5 01/08/2021   No results found for: PROLACTIN Lab Results  Component Value Date   CHOL 131 01/07/2021   TRIG 152 (H) 01/07/2021   HDL 47 01/07/2021   CHOLHDL 2.8 01/07/2021   VLDL 30 01/07/2021   LDLCALC 54 01/07/2021   Lab Results  Component Value Date   TSH 1.055  08/26/2021    Therapeutic Level Labs: No results found for: LITHIUM No results found for: CBMZ No results found for: VALPROATE  Current Medications: Current Outpatient Medications  Medication Sig Dispense Refill   FLUoxetine (PROZAC) 20 MG capsule Take 1 capsule (20 mg total) by mouth daily. 30 capsule 0   albuterol (PROVENTIL) (2.5 MG/3ML) 0.083% nebulizer solution Take 3 mLs (2.5 mg total) by nebulization every 6 (six) hours as needed for wheezing or shortness of breath. (Patient taking differently: Take 2.5 mg by nebulization See admin instructions. Nebulize 2.5 mg and inhale into the lungs every 4-6 hours as needed for wheezing or shortness of breath) 75 mL 12   albuterol (VENTOLIN HFA) 108 (90 Base) MCG/ACT inhaler Inhale 2 puffs into the lungs every 6 (six) hours as needed for wheezing or shortness of  breath.     allopurinol (ZYLOPRIM) 100 MG tablet Take 100 mg by mouth daily.     aspirin 81 MG EC tablet Take 81 mg by mouth in the morning.     atorvastatin (LIPITOR) 80 MG tablet Take 80 mg by mouth daily.     b complex vitamins capsule Take 1 capsule by mouth daily.     chlorpheniramine-HYDROcodone (TUSSIONEX PENNKINETIC ER) 10-8 MG/5ML Take 5 mLs by mouth every 12 (twelve) hours as needed for cough. 115 mL 0   Cholecalciferol (VITAMIN D3) 10 MCG (400 UNIT) CAPS Take 400 Units by mouth in the morning.     clopidogrel (PLAVIX) 75 MG tablet Take 75 mg by mouth daily.     ferrous sulfate 325 (65 FE) MG EC tablet Take 1 tablet (325 mg total) by mouth in the morning and at bedtime. 180 tablet 1   FLUDROCORTISONE ACETATE PO Take 0.2 mg by mouth in the morning and at bedtime.     HYDROcodone-acetaminophen (NORCO) 5-325 MG tablet Take 1 tablet by mouth every 6 (six) hours as needed for moderate pain. 30 tablet 0   latanoprost (XALATAN) 0.005 % ophthalmic solution Place 1 drop into both eyes at bedtime.     lidocaine (LIDODERM) 5 % Place 1 patch onto the skin daily. Remove & Discard patch  within 12 hours or as directed by MD 30 patch 0   magnesium oxide (MAG-OX) 400 MG tablet Take 400 mg by mouth daily.     memantine (NAMENDA) 10 MG tablet Take 1 tablet (10 mg total) by mouth 2 (two) times daily. (Patient taking differently: Take 10 mg by mouth in the morning and at bedtime.) 180 tablet 0   methylPREDNISolone (MEDROL DOSEPAK) 4 MG TBPK tablet Taper as directed on packaging 21 tablet 0   midodrine (PROAMATINE) 5 MG tablet Take 1 tablet (5 mg total) by mouth 3 (three) times daily with meals. 270 tablet 3   mometasone-formoterol (DULERA) 200-5 MCG/ACT AERO Inhale 2 puffs into the lungs 2 (two) times daily. 1 each 0   nitroGLYCERIN (NITROLINGUAL) 0.4 MG/SPRAY spray Place 1 spray under the tongue every 5 (five) minutes x 3 doses as needed for chest pain.     pantoprazole (PROTONIX) 40 MG tablet Take 1 tablet (40 mg total) by mouth every evening. 90 tablet 3   potassium citrate (UROCIT-K) 10 MEQ (1080 MG) SR tablet Take 10 mEq by mouth daily.     primidone (MYSOLINE) 50 MG tablet Take 2 tablets (100 mg total) by mouth 2 (two) times daily. 360 tablet 0   QUEtiapine (SEROQUEL) 50 MG tablet Take 2 tablets (100 mg total) by mouth at bedtime. 180 tablet 0   ranolazine (RANEXA) 1000 MG SR tablet Take 1,000 mg by mouth in the morning and at bedtime.     rOPINIRole (REQUIP XL) 4 MG 24 hr tablet Take 1 tablet (4 mg total) by mouth at bedtime. 90 tablet 0   triamcinolone ointment (KENALOG) 0.1 % Apply 1 application. topically as needed (to affected areas- for psoriasis flares).     valACYclovir (VALTREX) 1000 MG tablet Take 1 tablet (1,000 mg total) by mouth 3 (three) times daily for 10 days. 30 tablet 0   No current facility-administered medications for this visit.    Psychiatric Specialty Exam: Review of Systems  Cardiovascular:  Negative for chest pain.  Psychiatric/Behavioral:  Positive for dysphoric mood. Negative for agitation, behavioral problems, self-injury and suicidal ideas.     There were no vitals taken  for this visit.There is no height or weight on file to calculate BMI.  General Appearance: Casual  Eye Contact:  Fair  Speech:  Normal Rate  Volume:  Decreased  Mood: somewhat stressed, subdued  Affect:  Constricted  Thought Process:  Goal Directed  Orientation:  Full (Time, Place, and Person)  Thought Content:  Rumination  Suicidal Thoughts:  No  Homicidal Thoughts:  No  Memory:  Immediate;   Fair  Judgement:  Fair  Insight:  Fair  Psychomotor Activity:  Decreased  Concentration:  Concentration: Fair  Recall:  [place] of Knowledge:Good  Language: Good  Akathisia:  No  Handed:    AIMS (if indicated):  has essential tremors on meds. No other inovlunaary movemetns  Assets:  Desire for Improvement Financial Resources/Insurance Housing Social Support  ADL's:  Intact  Cognition: mild decline,  Sleep:   irregular   Screenings: GAD-7    Flowsheet Row Office Visit from 01/26/2021 in [facility] Visit from 12/24/2020 in [facility]  Total GAD-7 Score 14 [place] Office Visit from 02/08/2021 in [facility]  Total Score (max 30 points ) 30      PHQ2-9    [place] Visit from 09/06/2021 in [facility] [place] Visit from 07/07/2021 in [place] Visit from 03/30/2021 in [place] Office Visit from 03/15/2021 in [facility] Office Visit from 01/26/2021 in [facility]  PHQ-2 Total Score _0 PHQ-9 Total Score -- -- _1 Flowsheet Row Video Visit from 09/21/2021 in [facility] ED to Hosp-Admission (Discharged) from 08/26/2021 in [facility] HF PCU ED to Hosp-Admission  (Discharged) from 08/04/2021 in [facility] PCU  C-SSRS RISK CATEGORY No Risk No Risk No Risk       Assessment and Plan: as follows  Prior documentation reviewed  Major depressive recurrent moderate; stress related to health,and circumstances, consider therapy, will stop lexapro, start prozac 65m Seroquel helps with sl;eep and mood Adjustment disorder with depression and anxiety;see above start prozac,   Anxiety about health: continue to follow up with providers, consider therapy, change lexapro to prozac Fu 4 -6 weeks , if symptoms worsen call earlier or access emergency services       NMerian Capron MD 5/23/202311:44 AM

## 2021-09-22 ENCOUNTER — Other Ambulatory Visit: Payer: Self-pay

## 2021-09-22 MED ORDER — ROPINIROLE HCL ER 4 MG PO TB24: 4.0000 mg | ORAL_TABLET | Freq: Every day | ORAL | 0 refills | Status: DC

## 2021-09-22 NOTE — Progress Notes (Signed)
Rx refilled. 

## 2021-09-23 ENCOUNTER — Telehealth: Payer: Self-pay | Admitting: *Deleted

## 2021-09-23 ENCOUNTER — Other Ambulatory Visit: Payer: Self-pay | Admitting: *Deleted

## 2021-09-23 LAB — BASIC METABOLIC PANEL
BUN: 12 mg/dL (ref 7–25)
CO2: 23 mmol/L (ref 20–32)
Calcium: 9 mg/dL (ref 8.6–10.3)
Chloride: 101 mmol/L (ref 98–110)
Creat: 1.05 mg/dL (ref 0.70–1.35)
Glucose, Bld: 138 mg/dL — ABNORMAL HIGH (ref 65–99)
Potassium: 3.5 mmol/L (ref 3.5–5.3)
Sodium: 137 mmol/L (ref 135–146)

## 2021-09-23 MED ORDER — ROPINIROLE HCL ER 4 MG PO TB24: 4.0000 mg | ORAL_TABLET | Freq: Every day | ORAL | 3 refills | Status: AC

## 2021-09-23 NOTE — Telephone Encounter (Signed)
90-day rx for ropinirole ER 681m, one tab QHS sent to [facility] on 09/22/21. [facility]   I received a notification to call [facility] at [phone] I was informed they do not carry ropinirole ER 4481m If he gets it through the mail order, they would need a new rx for ropinirole ER 81m73mtwo tabs QHS. [facility] 10m11mne tab QHS.  I have called the primary number in Epic (which belongs to his wife on DPR). I left a message for her to call me back.   Need to confirm if she would like to use [facility] or [facility], based on the information above.

## 2021-09-23 NOTE — Telephone Encounter (Signed)
Pt returned phone call, would like a call back today before 4:30p

## 2021-09-23 NOTE — Telephone Encounter (Signed)
I spoke to his wife. They would like the rx sent back to [facility]. Rx voided at [place].

## 2021-09-24 ENCOUNTER — Other Ambulatory Visit: Payer: Self-pay

## 2021-09-24 MED ORDER — MIDODRINE HCL 5 MG PO TABS
5.0000 mg | ORAL_TABLET | Freq: Three times a day (TID) | ORAL | 3 refills | Status: DC
Start: 2021-09-24 — End: 2021-10-14

## 2021-09-28 ENCOUNTER — Telehealth: Payer: Self-pay

## 2021-09-28 ENCOUNTER — Telehealth: Payer: Self-pay | Admitting: Cardiology

## 2021-09-28 ENCOUNTER — Ambulatory Visit (INDEPENDENT_AMBULATORY_CARE_PROVIDER_SITE_OTHER): Payer: Medicare Other

## 2021-09-28 ENCOUNTER — Ambulatory Visit (INDEPENDENT_AMBULATORY_CARE_PROVIDER_SITE_OTHER): Payer: Medicare Other | Admitting: Medical-Surgical

## 2021-09-28 ENCOUNTER — Telehealth (HOSPITAL_COMMUNITY): Payer: Self-pay

## 2021-09-28 ENCOUNTER — Encounter: Payer: Self-pay | Admitting: Medical-Surgical

## 2021-09-28 ENCOUNTER — Other Ambulatory Visit (HOSPITAL_COMMUNITY): Payer: Self-pay

## 2021-09-28 VITALS — Temp 98.1°F | Ht 73.0 in | Wt 260.0 lb

## 2021-09-28 DIAGNOSIS — R0602 Shortness of breath: Secondary | ICD-10-CM

## 2021-09-28 DIAGNOSIS — R051 Acute cough: Secondary | ICD-10-CM

## 2021-09-28 IMAGING — DX DG CHEST 2V
2 series · 2 of 2 positions shown · non-contrast
Comparison: Chest radiograph [DATE]

CLINICAL DATA: Shortness of breath.  Cough

EXAM:
CHEST - 2 VIEW

[chest pa]
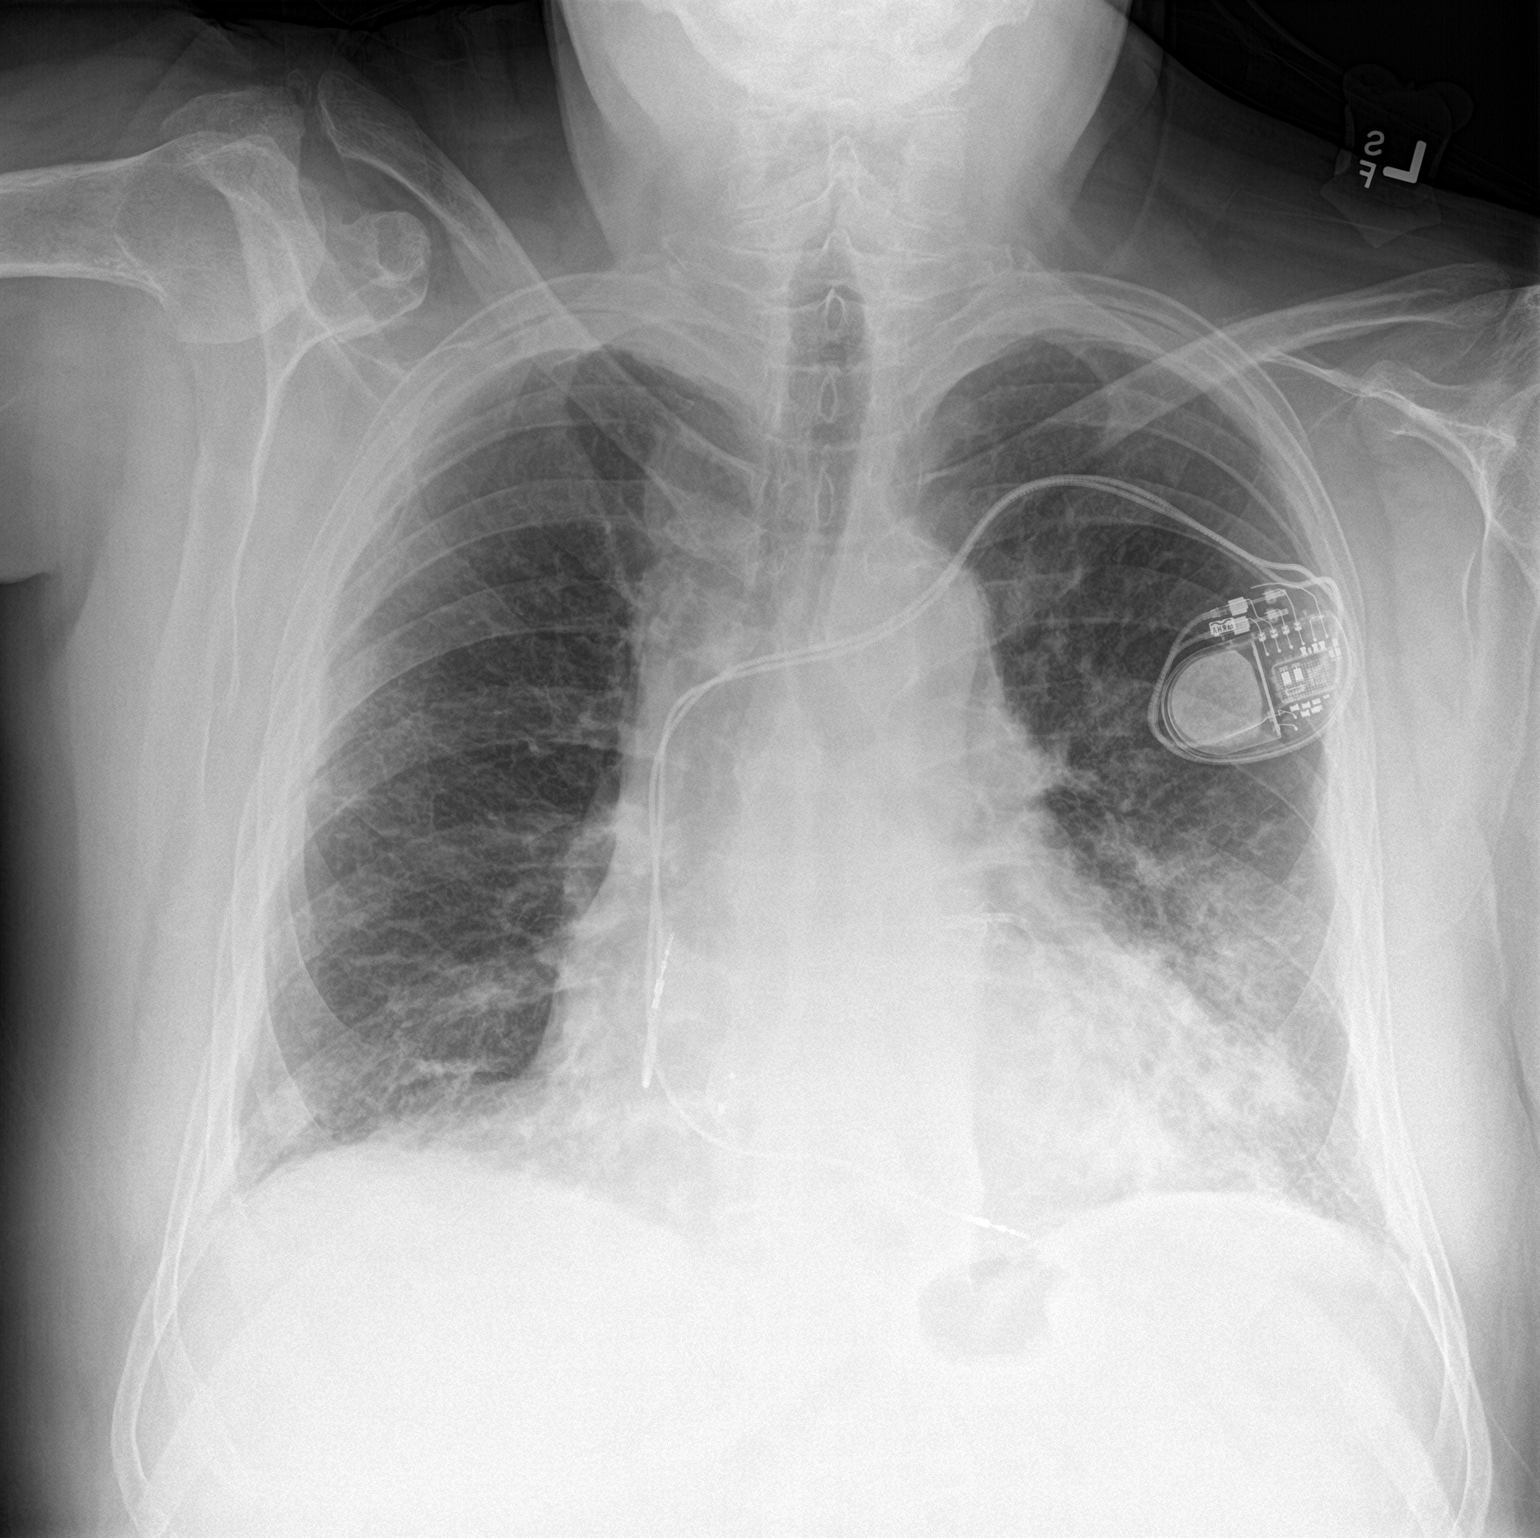

[chest lat]
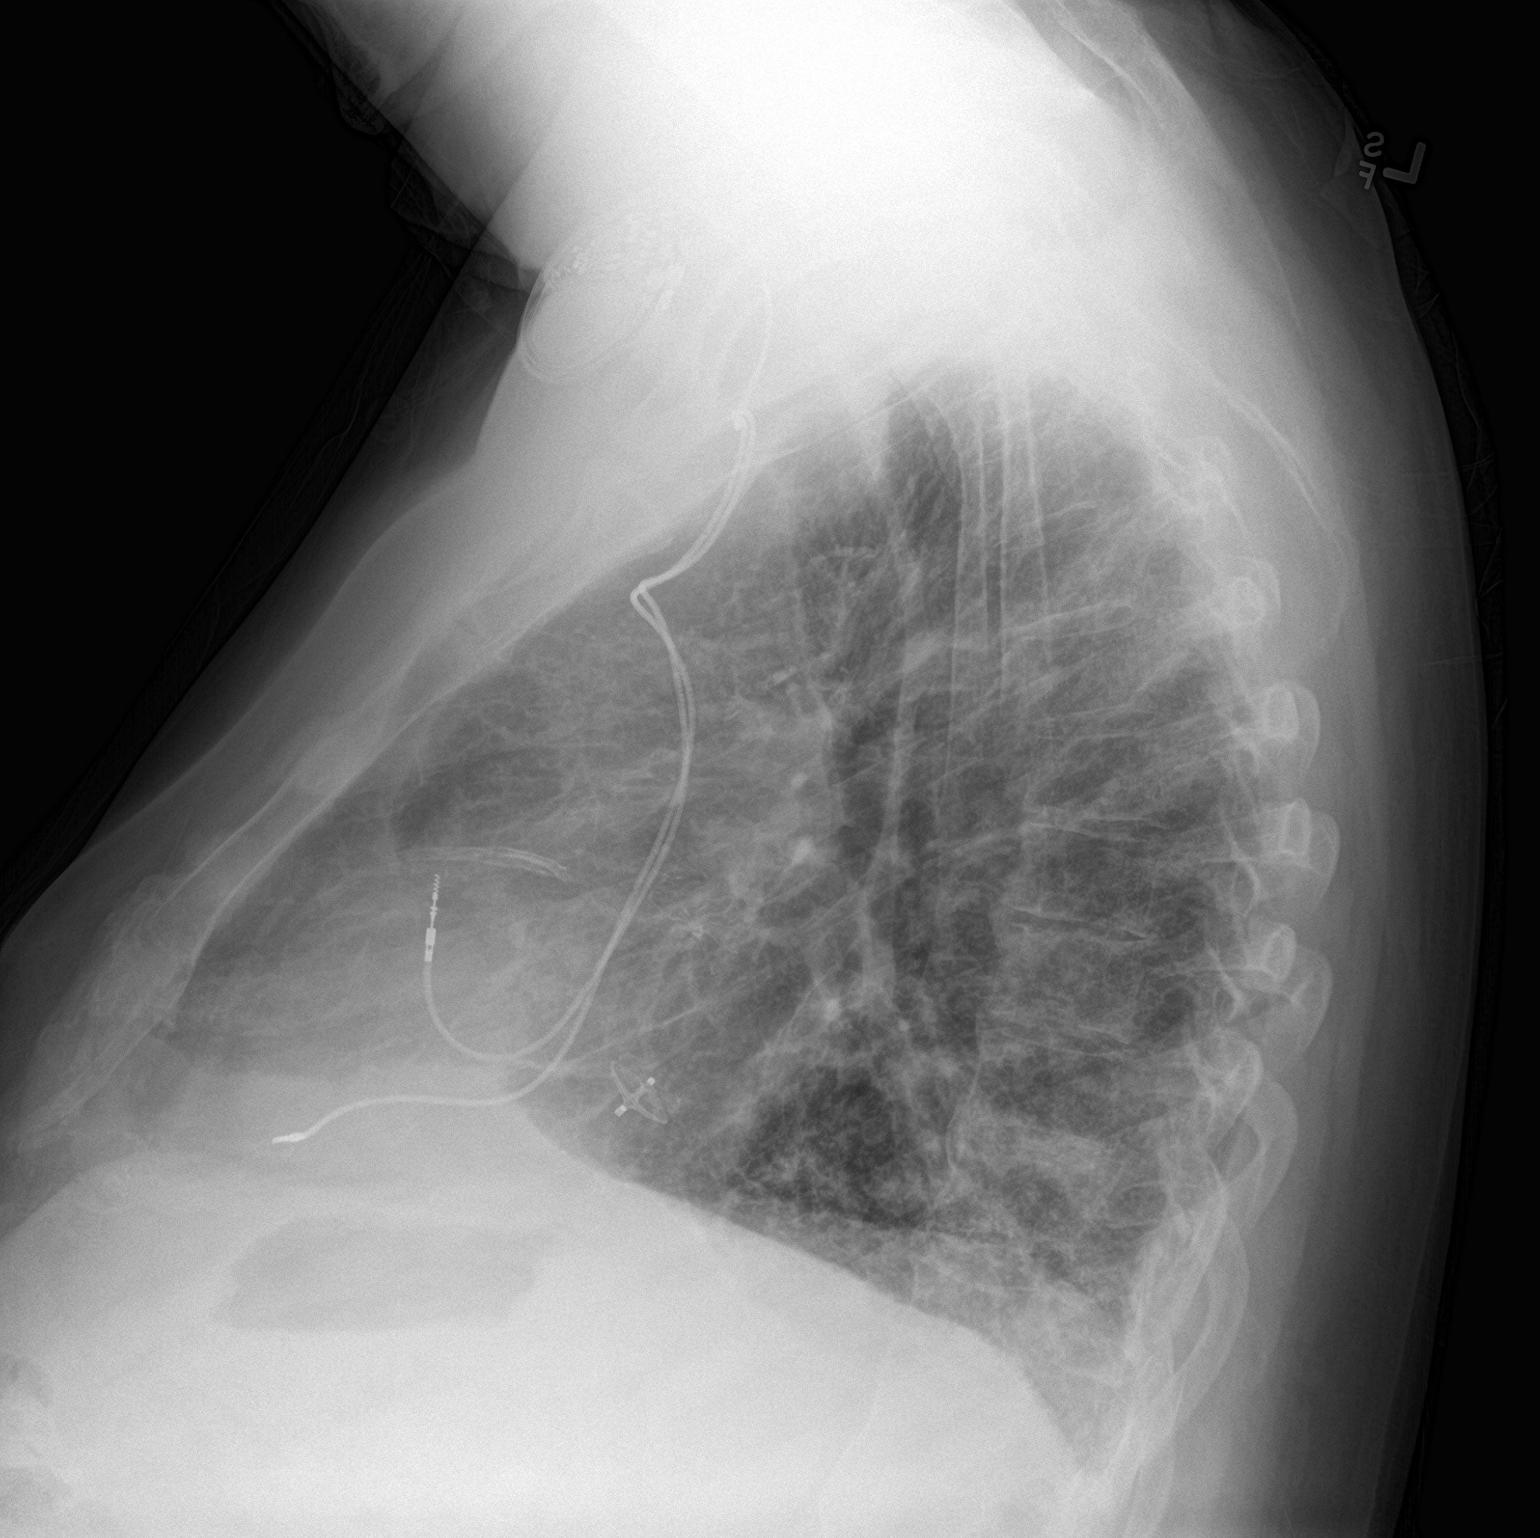

[2 of 2 positions shown; findings below may reference images not displayed]

FINDINGS: Multi lead pacer apparatus overlies the left hemithorax, leads are
stable in position. Interval development of patchy consolidation
left mid lower lung. No pleural effusion or pneumothorax. Thoracic
spine degenerative changes.
IMPRESSION: Findings most compatible with left lower lung pneumonia. Followup PA
and lateral chest X-ray is recommended in 3-4 weeks following trial
of antibiotic therapy to ensure resolution and exclude underlying
malignancy.

## 2021-09-28 MED ORDER — PROMETHAZINE-DM 6.25-15 MG/5ML PO SYRP: 5.0000 mL | ORAL_SOLUTION | Freq: Four times a day (QID) | ORAL | 0 refills | Status: DC | PRN

## 2021-09-28 MED ORDER — ESCITALOPRAM OXALATE 20 MG PO TABS: 30.0000 mg | ORAL_TABLET | Freq: Every day | ORAL | 0 refills | Status: DC

## 2021-09-28 MED ORDER — QUETIAPINE FUMARATE 50 MG PO TABS: 100.0000 mg | ORAL_TABLET | Freq: Every evening | ORAL | 0 refills | Status: DC

## 2021-09-28 NOTE — Telephone Encounter (Signed)
Pt c/o BP issue: STAT if pt c/o blurred vision, one-sided weakness or slurred speech  1. What are your last 5 BP readings?  Sitting: 1.)151/96 HR 103 2.) 148/93 HR 105 3.) 163/107 HR 97 4.) 149/100 HR 83  Standing: 1B) 76/71 HR 93      2B) 83/70 HR 100                 3B) 100/78 HR 100     4B) 121/77 HR 101  2. Are you having any other symptoms (ex. Dizziness, headache, blurred vision, passed out)? Dizziness, feeling like he want to pass out   3. What is your BP issue? Is going and up and down

## 2021-09-28 NOTE — Telephone Encounter (Signed)
Left message for spouse Mark Thomas- Per DPR- to call back this afternoon or tomorrow. If the pt is on 3m of Midodrine tid Dr. KAgustin Creerecommended he increase to qid. Will discuss with spouse when she calls back.

## 2021-09-28 NOTE — Telephone Encounter (Signed)
Pts spouse called stating that the pt is on midodrine 43m tid. She stated that they will increase to qid and keep a log of his blood pressures and report. She verbalized understanding and had no further questions.

## 2021-09-28 NOTE — Progress Notes (Signed)
Established Patient Office Visit  Subjective   Patient ID: Mark Thomas, male   DOB: 1950/06/15 Age: 71 y.o. MRN: [id number]   Chief Complaint  Patient presents with   Shortness of Breath   Hypotension    HPI Pleasant 71 year old male accompanied by his wife presenting today with complaints of worsening shortness of breath and coughing (nonproductive) over the last 4 days.  He has a very complicated medical history and is under the care of multiple specialist including cardiology, pulmonology, and psychiatry. Recently had pneumonia and was treated appropriately.  Over the past 4 days, he has been doing his nebulizers twice daily as well as using Dulera immediately after the nebulizer.  He has noted a decreased appetite as well as worsening of his depression.  At home, he has noted resting blood pressures that are significantly elevated but when he stands up, he becomes orthostatic with a drastic drop in his blood pressure and elevation of his heart rate.  Was recently switched from Lexapro to fluoxetine and notes that his depression has greatly worsened.  Also had his fludrocortisone changed to twice daily and wonders if this could be due to his shortness of breath and symptoms.  His wife has a call out to the cardiology practice but has not heard back from them yet.  They have also reached out to psychiatry and have not heard from them either.  Of note, he was recently diagnosed with congestive heart failure but is not weighing himself to evaluate for fluid retention.  Notes he never has swelling of the lower extremities since most of his fluid retention occurs in his abdomen.  Review of Systems  Constitutional:  Positive for malaise/fatigue. Negative for chills and fever.  Respiratory:  Positive for cough and shortness of breath. Negative for sputum production and wheezing.   Cardiovascular:  Negative for chest pain, palpitations and leg swelling.  Neurological:  Negative for dizziness and  headaches.  Psychiatric/Behavioral:  Positive for depression. Negative for suicidal ideas. The patient is nervous/anxious. The patient does not have insomnia.      Objective:    Vitals:   09/28/21 1431  Temp: 98.1 F (36.7 C)  Height: _0  (1.854 m)  Weight: 260 lb (117.9 kg)  SpO2: 94%  TempSrc: Oral  BMI (Calculated): 34.31    Physical Exam Vitals reviewed.  Constitutional:      General: He is not in acute distress.    Appearance: Normal appearance. He is obese. He is not ill-appearing.  HENT:     Head: Normocephalic and atraumatic.  Cardiovascular:     Rate and Rhythm: Normal rate and regular rhythm.     Pulses: Normal pulses.     Heart sounds: Murmur heard.    No friction rub. No gallop.  Pulmonary:     Effort: Pulmonary effort is normal. No respiratory distress.     Breath sounds: Normal breath sounds. No decreased breath sounds, wheezing, rhonchi or rales.  Musculoskeletal:     Right lower leg: No edema.     Left lower leg: No edema.  Skin:    General: Skin is warm and dry.  Neurological:     Mental Status: He is alert and oriented to person, place, and time.  Psychiatric:        Mood and Affect: Mood is anxious.        Behavior: Behavior normal.        Thought Content: Thought content normal.  Judgment: Judgment normal.   No results found for this or any previous visit (from the past 24 hour(s)).     The ASCVD Risk score (Arnett DK, et al., 2019) failed to calculate for the following reasons:   The patient has a prior MI or stroke diagnosis   Assessment & Plan:   Coughing Recurrence of severe coughing.  Chest x-ray today.  On exam, fields clear to auscultation with no significant wheezing.  Questionable CHF exacerbation.  Checking BMP and BNP.  Offered [employer] but these have been ineffective in the past.  Recent overdose of narcotics so avoiding cough syrup containing codeine.  Sending and Promethazine DM 5 mL 4 times daily as needed  however advised sparing use of this due to sedation.  Patient and wife are aware that overuse of this medication can cause oversedation and may worsen his overall health situation.  Small quantity sent and patient and wife verbalized understanding of the need for monitoring.  Dyspnea Possible relation to multiple etiologies including CHF, post COVID-pneumonia changes, and anxiety.  Will not adjust cardiology medications at this time.  Recommend reaching back out to cardiology if no answer towards the end of the day.  Message sent to psychiatry requesting out reach to the patient and his wife regarding severe depression and anxiety.  Return if symptoms worsen or fail to improve.  ___________________________________________ Clearnce Sorrel, DNP, APRN, FNP-BC Primary Care and [place]

## 2021-09-28 NOTE — Assessment & Plan Note (Signed)
Recurrence of severe coughing.  Chest x-ray today.  On exam, fields clear to auscultation with no significant wheezing.  Questionable CHF exacerbation.  Checking BMP and BNP.  Offered [employer] but these have been ineffective in the past.  Recent overdose of narcotics so avoiding cough syrup containing codeine.  Sending and Promethazine DM 5 mL 4 times daily as needed however advised sparing use of this due to sedation.  Patient and wife are aware that overuse of this medication can cause oversedation and may worsen his overall health situation.  Small quantity sent and patient and wife verbalized understanding of the need for monitoring.

## 2021-09-28 NOTE — Telephone Encounter (Signed)
Wife called stating that the new med (Prozac) is not helping paitents' depression. She says he is in a deep depression and it has gotten worse since last Thursday. She would like your advise. Wife says that patient sleeps all day long and has no appetite. He has an appt with PCP to speak about other issues this afternoon.   CB# [phone]

## 2021-09-28 NOTE — Assessment & Plan Note (Signed)
Possible relation to multiple etiologies including CHF, post COVID-pneumonia changes, and anxiety.  Will not adjust cardiology medications at this time.  Recommend reaching back out to cardiology if no answer towards the end of the day.  Message sent to psychiatry requesting out reach to the patient and his wife regarding severe depression and anxiety.

## 2021-09-29 ENCOUNTER — Encounter: Payer: Self-pay | Admitting: Medical-Surgical

## 2021-09-29 LAB — BASIC METABOLIC PANEL WITH GFR
BUN: 12 mg/dL (ref 7–25)
CO2: 23 mmol/L (ref 20–32)
Calcium: 8.7 mg/dL (ref 8.6–10.3)
Chloride: 103 mmol/L (ref 98–110)
Creat: 0.99 mg/dL (ref 0.70–1.35)
Glucose, Bld: 158 mg/dL — ABNORMAL HIGH (ref 65–99)
Potassium: 4.3 mmol/L (ref 3.5–5.3)
Sodium: 136 mmol/L (ref 135–146)
eGFR: 83 mL/min/{1.73_m2} (ref >=60)

## 2021-09-29 LAB — BRAIN NATRIURETIC PEPTIDE: Brain Natriuretic Peptide: 180 pg/mL — ABNORMAL HIGH (ref <100)

## 2021-09-30 ENCOUNTER — Encounter: Payer: Self-pay | Admitting: Family Medicine

## 2021-09-30 ENCOUNTER — Encounter: Payer: Self-pay | Admitting: Pulmonary Disease

## 2021-09-30 ENCOUNTER — Other Ambulatory Visit: Payer: Self-pay | Admitting: Medical-Surgical

## 2021-09-30 MED ORDER — AZITHROMYCIN 250 MG PO TABS: ORAL_TABLET | ORAL | 0 refills | Status: DC

## 2021-10-03 ENCOUNTER — Inpatient Hospital Stay (HOSPITAL_BASED_OUTPATIENT_CLINIC_OR_DEPARTMENT_OTHER)
Admission: EM | Admit: 2021-10-03 | Discharge: 2021-10-14 | DRG: 193 | Disposition: A | Discharge status: Skilled Nursing Facility | Payer: Medicare Other | Attending: Internal Medicine | Admitting: Internal Medicine

## 2021-10-03 ENCOUNTER — Other Ambulatory Visit: Payer: Self-pay

## 2021-10-03 ENCOUNTER — Emergency Department (HOSPITAL_BASED_OUTPATIENT_CLINIC_OR_DEPARTMENT_OTHER): Payer: Medicare Other

## 2021-10-03 ENCOUNTER — Encounter (HOSPITAL_BASED_OUTPATIENT_CLINIC_OR_DEPARTMENT_OTHER): Payer: Self-pay | Admitting: Emergency Medicine

## 2021-10-03 DIAGNOSIS — E7800 Pure hypercholesterolemia, unspecified: Secondary | ICD-10-CM | POA: Diagnosis present

## 2021-10-03 DIAGNOSIS — F0154 Vascular dementia, unspecified severity, with anxiety: Secondary | ICD-10-CM | POA: Diagnosis present

## 2021-10-03 DIAGNOSIS — Z8249 Family history of ischemic heart disease and other diseases of the circulatory system: Secondary | ICD-10-CM

## 2021-10-03 DIAGNOSIS — J9601 Acute respiratory failure with hypoxia: Secondary | ICD-10-CM | POA: Diagnosis present

## 2021-10-03 DIAGNOSIS — Z7982 Long term (current) use of aspirin: Secondary | ICD-10-CM

## 2021-10-03 DIAGNOSIS — J189 Pneumonia, unspecified organism: Principal | ICD-10-CM

## 2021-10-03 DIAGNOSIS — R21 Rash and other nonspecific skin eruption: Secondary | ICD-10-CM | POA: Diagnosis present

## 2021-10-03 DIAGNOSIS — R042 Hemoptysis: Secondary | ICD-10-CM

## 2021-10-03 DIAGNOSIS — Z950 Presence of cardiac pacemaker: Secondary | ICD-10-CM

## 2021-10-03 DIAGNOSIS — I130 Hypertensive heart and chronic kidney disease with heart failure and stage 1 through stage 4 chronic kidney disease, or unspecified chronic kidney disease: Secondary | ICD-10-CM | POA: Diagnosis present

## 2021-10-03 DIAGNOSIS — E876 Hypokalemia: Secondary | ICD-10-CM | POA: Diagnosis not present

## 2021-10-03 DIAGNOSIS — J9811 Atelectasis: Secondary | ICD-10-CM | POA: Diagnosis present

## 2021-10-03 DIAGNOSIS — N182 Chronic kidney disease, stage 2 (mild): Secondary | ICD-10-CM | POA: Diagnosis present

## 2021-10-03 DIAGNOSIS — I2510 Atherosclerotic heart disease of native coronary artery without angina pectoris: Secondary | ICD-10-CM | POA: Diagnosis present

## 2021-10-03 DIAGNOSIS — J440 Chronic obstructive pulmonary disease with acute lower respiratory infection: Secondary | ICD-10-CM | POA: Diagnosis present

## 2021-10-03 DIAGNOSIS — J181 Lobar pneumonia, unspecified organism: Principal | ICD-10-CM | POA: Diagnosis present

## 2021-10-03 DIAGNOSIS — I5032 Chronic diastolic (congestive) heart failure: Secondary | ICD-10-CM | POA: Diagnosis present

## 2021-10-03 DIAGNOSIS — Z7902 Long term (current) use of antithrombotics/antiplatelets: Secondary | ICD-10-CM

## 2021-10-03 DIAGNOSIS — Z955 Presence of coronary angioplasty implant and graft: Secondary | ICD-10-CM

## 2021-10-03 DIAGNOSIS — Z881 Allergy status to other antibiotic agents status: Secondary | ICD-10-CM

## 2021-10-03 DIAGNOSIS — I252 Old myocardial infarction: Secondary | ICD-10-CM

## 2021-10-03 DIAGNOSIS — Z79899 Other long term (current) drug therapy: Secondary | ICD-10-CM

## 2021-10-03 DIAGNOSIS — Z6834 Body mass index (BMI) 34.0-34.9, adult: Secondary | ICD-10-CM

## 2021-10-03 DIAGNOSIS — I471 Supraventricular tachycardia: Secondary | ICD-10-CM | POA: Diagnosis present

## 2021-10-03 DIAGNOSIS — R627 Adult failure to thrive: Secondary | ICD-10-CM | POA: Diagnosis present

## 2021-10-03 DIAGNOSIS — Z8616 Personal history of COVID-19: Secondary | ICD-10-CM

## 2021-10-03 DIAGNOSIS — K219 Gastro-esophageal reflux disease without esophagitis: Secondary | ICD-10-CM | POA: Diagnosis present

## 2021-10-03 DIAGNOSIS — Z885 Allergy status to narcotic agent status: Secondary | ICD-10-CM

## 2021-10-03 DIAGNOSIS — Z8774 Personal history of (corrected) congenital malformations of heart and circulatory system: Secondary | ICD-10-CM

## 2021-10-03 DIAGNOSIS — Z8701 Personal history of pneumonia (recurrent): Secondary | ICD-10-CM

## 2021-10-03 DIAGNOSIS — Z880 Allergy status to penicillin: Secondary | ICD-10-CM

## 2021-10-03 DIAGNOSIS — I480 Paroxysmal atrial fibrillation: Secondary | ICD-10-CM | POA: Diagnosis present

## 2021-10-03 DIAGNOSIS — R040 Epistaxis: Secondary | ICD-10-CM | POA: Diagnosis not present

## 2021-10-03 DIAGNOSIS — I495 Sick sinus syndrome: Secondary | ICD-10-CM | POA: Diagnosis present

## 2021-10-03 DIAGNOSIS — F32A Depression, unspecified: Secondary | ICD-10-CM | POA: Diagnosis present

## 2021-10-03 DIAGNOSIS — Z20822 Contact with and (suspected) exposure to covid-19: Secondary | ICD-10-CM | POA: Diagnosis present

## 2021-10-03 DIAGNOSIS — G250 Essential tremor: Secondary | ICD-10-CM | POA: Diagnosis present

## 2021-10-03 DIAGNOSIS — Z823 Family history of stroke: Secondary | ICD-10-CM

## 2021-10-03 DIAGNOSIS — I951 Orthostatic hypotension: Secondary | ICD-10-CM | POA: Diagnosis present

## 2021-10-03 DIAGNOSIS — Z8673 Personal history of transient ischemic attack (TIA), and cerebral infarction without residual deficits: Secondary | ICD-10-CM

## 2021-10-03 DIAGNOSIS — Z888 Allergy status to other drugs, medicaments and biological substances status: Secondary | ICD-10-CM

## 2021-10-03 DIAGNOSIS — I350 Nonrheumatic aortic (valve) stenosis: Secondary | ICD-10-CM | POA: Diagnosis present

## 2021-10-03 LAB — URINALYSIS, ROUTINE W REFLEX MICROSCOPIC
Bilirubin Urine: NEGATIVE
Glucose, UA: NEGATIVE mg/dL
Hgb urine dipstick: NEGATIVE
Ketones, ur: NEGATIVE mg/dL
Leukocytes,Ua: NEGATIVE
Nitrite: NEGATIVE
Protein, ur: NEGATIVE mg/dL
Specific Gravity, Urine: 1.01 (ref 1.005–1.030)
pH: 7 (ref 5.0–8.0)

## 2021-10-03 LAB — CBC WITH DIFFERENTIAL/PLATELET
Abs Immature Granulocytes: 0.09 10*3/uL — ABNORMAL HIGH (ref 0.00–0.07)
Basophils Absolute: 0 10*3/uL (ref 0.0–0.1)
Basophils Relative: 0 %
Eosinophils Absolute: 0 10*3/uL (ref 0.0–0.5)
Eosinophils Relative: 0 %
HCT: 30.8 % — ABNORMAL LOW (ref 39.0–52.0)
Hemoglobin: 10.4 g/dL — ABNORMAL LOW (ref 13.0–17.0)
Immature Granulocytes: 1 %
Lymphocytes Relative: 8 %
Lymphs Abs: 1.1 10*3/uL (ref 0.7–4.0)
MCH: 34 pg (ref 26.0–34.0)
MCHC: 33.8 g/dL (ref 30.0–36.0)
MCV: 100.7 fL — ABNORMAL HIGH (ref 80.0–100.0)
Monocytes Absolute: 1.3 10*3/uL — ABNORMAL HIGH (ref 0.1–1.0)
Monocytes Relative: 10 %
Neutro Abs: 10.7 10*3/uL — ABNORMAL HIGH (ref 1.7–7.7)
Neutrophils Relative %: 81 %
Platelets: 209 10*3/uL (ref 150–400)
RBC: 3.06 MIL/uL — ABNORMAL LOW (ref 4.22–5.81)
RDW: 15.7 % — ABNORMAL HIGH (ref 11.5–15.5)
WBC: 13.3 10*3/uL — ABNORMAL HIGH (ref 4.0–10.5)
nRBC: 0 % (ref 0.0–0.2)

## 2021-10-03 LAB — COMPREHENSIVE METABOLIC PANEL
ALT: 20 U/L (ref 0–44)
AST: 26 U/L (ref 15–41)
Albumin: 2.8 g/dL — ABNORMAL LOW (ref 3.5–5.0)
Alkaline Phosphatase: 56 U/L (ref 38–126)
Anion gap: 8 (ref 5–15)
BUN: 12 mg/dL (ref 8–23)
CO2: 25 mmol/L (ref 22–32)
Calcium: 8.5 mg/dL — ABNORMAL LOW (ref 8.9–10.3)
Chloride: 102 mmol/L (ref 98–111)
Creatinine, Ser: 1.01 mg/dL (ref 0.61–1.24)
GFR, Estimated: >60 mL/min (ref >60)
Glucose, Bld: 128 mg/dL — ABNORMAL HIGH (ref 70–99)
Potassium: 4.2 mmol/L (ref 3.5–5.1)
Sodium: 135 mmol/L (ref 135–145)
Total Bilirubin: 1 mg/dL (ref 0.3–1.2)
Total Protein: 7.1 g/dL (ref 6.5–8.1)

## 2021-10-03 LAB — PROTIME-INR
INR: 1.2 (ref 0.8–1.2)
Prothrombin Time: 15.1 seconds (ref 11.4–15.2)

## 2021-10-03 LAB — RESP PANEL BY RT-PCR (FLU A&B, COVID) ARPGX2
Influenza A by PCR: NEGATIVE
Influenza B by PCR: NEGATIVE
SARS Coronavirus 2 by RT PCR: NEGATIVE

## 2021-10-03 LAB — LACTIC ACID, PLASMA: Lactic Acid, Venous: 1.3 mmol/L (ref 0.5–1.9)

## 2021-10-03 IMAGING — DX DG CHEST 1V PORT
1 series · 1 of 1 positions shown · non-contrast
Comparison: [DATE]

CLINICAL DATA: Dry cough for 1 week

EXAM:
PORTABLE CHEST 1 VIEW

[chest ap]
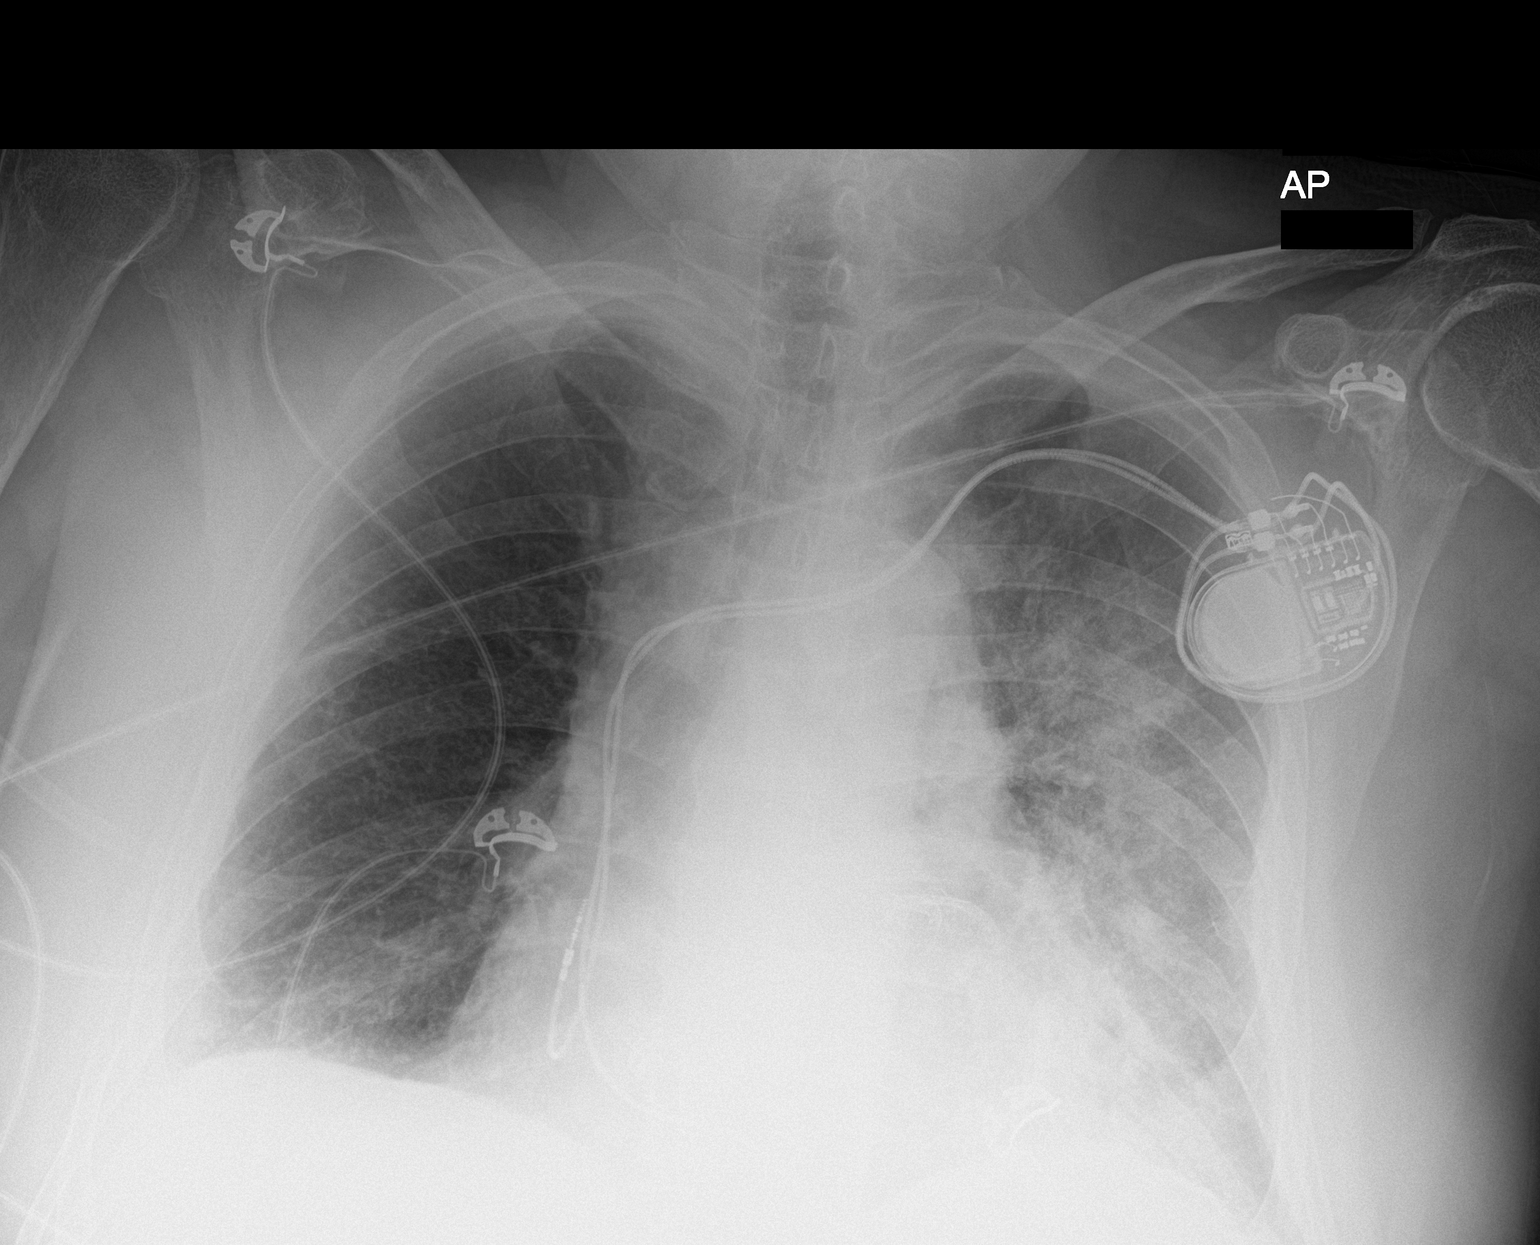

[1 of 1 positions shown; findings below may reference images not displayed]

FINDINGS: Left chest cardiac device with leads overlying the right atrium and
ventricle. Increased heterogeneous opacities in the left lung, now
involving the left upper lung. No pleural effusion or pneumothorax.
Unchanged cardiac and mediastinal contours. No acute osseous
abnormality.
IMPRESSION: Increased heterogeneous opacities in the left lung, now involving
the left upper lung, concerning for worsening pneumonia.

## 2021-10-03 MED ORDER — SODIUM CHLORIDE 0.9 % IV SOLN: 2.0000 g | Freq: Once | INTRAVENOUS | Status: DC

## 2021-10-03 MED ORDER — AMIODARONE HCL IN DEXTROSE 360-4.14 MG/200ML-% IV SOLN
30.0000 mg/h | INTRAVENOUS | Status: DC
  Administered 2021-10-04 – 2021-10-06 (×5): 30 mg/h via INTRAVENOUS
  Filled 2021-10-03 (×5): qty 200

## 2021-10-03 MED ORDER — AMIODARONE HCL IN DEXTROSE 360-4.14 MG/200ML-% IV SOLN
60.0000 mg/h | INTRAVENOUS | Status: AC
  Administered 2021-10-03: 60 mg/h via INTRAVENOUS
  Filled 2021-10-03: qty 200

## 2021-10-03 MED ORDER — SODIUM CHLORIDE 0.9 % IV SOLN
2.0000 g | Freq: Three times a day (TID) | INTRAVENOUS | Status: AC
  Administered 2021-10-03 – 2021-10-13 (×29): 2 g via INTRAVENOUS
  Filled 2021-10-03 (×30): qty 12.5

## 2021-10-03 MED ORDER — ACETAMINOPHEN 325 MG PO TABS
650.0000 mg | ORAL_TABLET | Freq: Once | ORAL | Status: AC
Start: 2021-10-03 — End: 2021-10-03
  Administered 2021-10-03: 650 mg via ORAL
  Filled 2021-10-03: qty 2

## 2021-10-03 MED ORDER — VANCOMYCIN HCL IN DEXTROSE 1-5 GM/200ML-% IV SOLN
1000.0000 mg | Freq: Once | INTRAVENOUS | Status: AC
  Administered 2021-10-03: 1000 mg via INTRAVENOUS
  Filled 2021-10-03: qty 200

## 2021-10-03 MED ORDER — GUAIFENESIN 100 MG/5ML PO LIQD
5.0000 mL | Freq: Once | ORAL | Status: AC
  Administered 2021-10-03: 5 mL via ORAL
  Filled 2021-10-03: qty 10

## 2021-10-03 MED ORDER — SODIUM CHLORIDE 0.9 % IV SOLN: INTRAVENOUS | Status: DC | PRN

## 2021-10-03 MED ORDER — AMIODARONE IV BOLUS ONLY 150 MG/100ML
150.0000 mg | Freq: Once | INTRAVENOUS | Status: AC
  Administered 2021-10-03: 150 mg via INTRAVENOUS
  Filled 2021-10-03: qty 100

## 2021-10-03 MED ORDER — VANCOMYCIN HCL 1500 MG/300ML IV SOLN
1500.0000 mg | Freq: Two times a day (BID) | INTRAVENOUS | Status: DC
  Administered 2021-10-04 – 2021-10-05 (×2): 1500 mg via INTRAVENOUS
  Filled 2021-10-03 (×4): qty 300

## 2021-10-03 MED ORDER — SODIUM CHLORIDE 0.9 % IV SOLN
500.0000 mg | Freq: Once | INTRAVENOUS | Status: AC
  Administered 2021-10-03: 500 mg via INTRAVENOUS
  Filled 2021-10-03: qty 5

## 2021-10-03 MED ORDER — VANCOMYCIN HCL IN DEXTROSE 1-5 GM/200ML-% IV SOLN
1000.0000 mg | Freq: Once | INTRAVENOUS | Status: AC
Start: 2021-10-03 — End: 2021-10-03
  Administered 2021-10-03: 1000 mg via INTRAVENOUS
  Filled 2021-10-03: qty 200

## 2021-10-03 MED ORDER — GUAIFENESIN 100 MG/5ML PO LIQD
15.0000 mL | Freq: Once | ORAL | Status: AC
  Administered 2021-10-03: 15 mL via ORAL
  Filled 2021-10-03: qty 20

## 2021-10-03 MED ORDER — QUETIAPINE FUMARATE 100 MG PO TABS
100.0000 mg | ORAL_TABLET | Freq: Every day | ORAL | Status: DC
  Administered 2021-10-03 – 2021-10-13 (×10): 100 mg via ORAL
  Filled 2021-10-03 (×12): qty 1

## 2021-10-03 MED ORDER — LACTATED RINGERS IV SOLN: INTRAVENOUS | Status: AC

## 2021-10-03 MED ORDER — GUAIFENESIN-DM 100-10 MG/5ML PO SYRP: 5.0000 mL | ORAL_SOLUTION | ORAL | Status: DC | PRN

## 2021-10-03 MED ORDER — SODIUM CHLORIDE 0.9 % IV BOLUS (SEPSIS)
1000.0000 mL | Freq: Once | INTRAVENOUS | Status: AC
  Administered 2021-10-03: 1000 mL via INTRAVENOUS

## 2021-10-03 MED ORDER — VANCOMYCIN HCL IN DEXTROSE 1-5 GM/200ML-% IV SOLN: 1000.0000 mg | Freq: Once | INTRAVENOUS | Status: DC

## 2021-10-03 NOTE — Progress Notes (Signed)
Pharmacy Antibiotic Note  Mark Thomas is a 72 y.o. male for which pharmacy has been consulted for vancomycin and cefepime dosing for pneumonia.  Patient with a history of anxiety, CAD, depression, essential tremor, GERD, HTN, MI, orthostatic hypotension, stroke, vascular dementia. Patient presenting with cough x 1 week. Diagnosed with PNA on Thursday prescribed z-pack.  SCr 0.99 WBC pending; LA pending; T 98.9 F; HR 103; RR 24  Plan: Collect MRSA PCR if admitted to help direct vancomycin therapy Azithromycin per MD Cefepime 2g q8hr Vancomycin 2000 mg once then 1500 mg q12hr (eAUC 495.1) unless change in renal function Trend WBC, Fever, Renal function, & Clinical course F/u cultures, clinical course, WBC, fever De-escalate when able Levels at steady state F/u MRSA PCR  Height: _0  (185.4 cm) Weight: 117.9 kg (260 lb) IBW/kg (Calculated) : 79.9  Temp (24hrs), Avg:98.9 F (37.2 C), Min:98.9 F (37.2 C), Max:98.9 F (37.2 C)  Recent Labs  Lab 09/28/21 0000  CREATININE 0.99    Estimated Creatinine Clearance: 96.1 mL/min (by C-G formula based on SCr of 0.99 mg/dL).    Allergies  Allergen Reactions   Dilaudid [Hydromorphone] Other (See Comments)    Respiratory Arrest!!!!   Bactrim [Sulfamethoxazole-Trimethoprim] Rash   Benadryl [Diphenhydramine] Other (See Comments)    Muscle spasms   Phenobarbital Other (See Comments)    From childhood- reaction not recalled at this time   Penicillins Other (See Comments)    From childhood- reaction not recalled at this time   Antimicrobials this admission: azith PO 6/1 >> 6/3 Azith IV x 1 in ED 6/4 Cefepime 6/4 >>  Vancomycin 6/4 >>  Microbiology results: Pending  Thank you for allowing pharmacy to be a part of this patient's care.  Lorelei Pont, PharmD, BCPS 10/03/2021 1:36 PM ED Clinical Pharmacist -  [phone]

## 2021-10-03 NOTE — ED Triage Notes (Signed)
Pt c/o dry cough x 1 week. Pt was diagnosed with pneumonia on Thursday.

## 2021-10-03 NOTE — ED Notes (Signed)
EDP at bedside attempting IV access

## 2021-10-03 NOTE — ED Notes (Signed)
Pt noted to be in afib with RVR on philips monitor. Order obtained and carried out for EKG. EKG taken directly to EDP for review.

## 2021-10-03 NOTE — ED Provider Notes (Signed)
[facility] Provider Note   CSN: [id number] Arrival date & time: 10/03/21  1221     History  Chief Complaint  Patient presents with   Cough    Mark Thomas is a 71 y.o. male.  HPI     71 y.o. male with medical history significant of HTN, CAD s/p stent to LAD, CVA, MVP, PAF s/p watchman device, vascular dementia, CKD stage 3, HLD, hx of orthostatic hypotension on midodrine who presented to ED with complaints of coughing/fever. Admitted on 430 with pneumonia.   Patient indicates that he started getting some cough earlier in the week.  He was seen by his PCP and started on azithromycin on Thursday.  Over time, his cough has worsened.  He is also getting increased shortness of breath.  Wife indicates that patient had COVID-19 in December and then everything has gone downhill.  Patient has had at least 4-5 bouts of pneumonia since COVID-19.  Home Medications Prior to Admission medications   Medication Sig Start Date End Date Taking? Authorizing Provider  albuterol (PROVENTIL) (2.5 MG/3ML) 0.083% nebulizer solution Take 3 mLs (2.5 mg total) by nebulization every 6 (six) hours as needed for wheezing or shortness of breath. Patient taking differently: Take 2.5 mg by nebulization See admin instructions. Nebulize 2.5 mg and inhale into the lungs every 4-6 hours as needed for wheezing or shortness of breath 07/15/21   Luetta Nutting, DO  albuterol (VENTOLIN HFA) 108 (90 Base) MCG/ACT inhaler Inhale 2 puffs into the lungs every 6 (six) hours as needed for wheezing or shortness of breath.    [provider]  allopurinol (ZYLOPRIM) 100 MG tablet Take 100 mg by mouth daily. 09/22/20   [provider]  aspirin 81 MG EC tablet Take 81 mg by mouth in the morning. 05/05/14   [provider]  atorvastatin (LIPITOR) 80 MG tablet Take 80 mg by mouth daily. 11/18/20   [provider]  azithromycin (ZITHROMAX) 250 MG tablet Take 2 tablets on  day 1, then 1 tablet daily on days 2 through 5 09/30/21 10/05/21  Samuel Bouche, NP  b complex vitamins capsule Take 1 capsule by mouth daily.    [provider]  Cholecalciferol (VITAMIN D3) 10 MCG (400 UNIT) CAPS Take 400 Units by mouth in the morning.    [provider]  clopidogrel (PLAVIX) 75 MG tablet Take 75 mg by mouth daily. 11/18/20   [provider]  escitalopram (LEXAPRO) 20 MG tablet Take 1.5 tablets (30 mg total) by mouth daily. 09/28/21   Merian Capron, MD  ferrous sulfate 325 (65 FE) MG EC tablet Take 1 tablet (325 mg total) by mouth in the morning and at bedtime. 03/19/21 09/13/21  Luetta Nutting, DO  FLUDROCORTISONE ACETATE PO Take 0.2 mg by mouth in the morning and at bedtime.    [provider]  latanoprost (XALATAN) 0.005 % ophthalmic solution Place 1 drop into both eyes at bedtime. 12/07/20   [provider]  magnesium oxide (MAG-OX) 400 MG tablet Take 400 mg by mouth daily.    [provider]  memantine (NAMENDA) 10 MG tablet TAKE 1 TABLET TWICE A DAY 09/21/21   Alric Ran, MD  midodrine (PROAMATINE) 5 MG tablet Take 1 tablet (5 mg total) by mouth 3 (three) times daily with meals. 09/24/21   Park Liter, MD  mometasone-formoterol (DULERA) 200-5 MCG/ACT AERO Inhale 2 puffs into the lungs 2 (two) times daily. 09/14/21   Freddi Starr,  MD  nitroGLYCERIN (NITROLINGUAL) 0.4 MG/SPRAY spray Place 1 spray under the tongue every 5 (five) minutes x 3 doses as needed for chest pain. 05/05/14   [provider]  pantoprazole (PROTONIX) 40 MG tablet Take 1 tablet (40 mg total) by mouth every evening. 07/07/21   Luetta Nutting, DO  potassium citrate (UROCIT-K) 10 MEQ (1080 MG) SR tablet Take 10 mEq by mouth daily. 09/22/20   [provider]  primidone (MYSOLINE) 50 MG tablet Take 2 tablets (100 mg total) by mouth 2 (two) times daily. 08/24/21 11/22/21  Alric Ran, MD  promethazine-dextromethorphan (PROMETHAZINE-DM)  6.25-15 MG/5ML syrup Take 5 mLs by mouth 4 (four) times daily as needed for cough. 09/28/21   Samuel Bouche, NP  QUEtiapine (SEROQUEL) 50 MG tablet Take 2 tablets (100 mg total) by mouth at bedtime. 09/28/21 12/27/21  Merian Capron, MD  ranolazine (RANEXA) 1000 MG SR tablet Take 1,000 mg by mouth in the morning and at bedtime.    [provider]  rOPINIRole (REQUIP XL) 4 MG 24 hr tablet Take 1 tablet (4 mg total) by mouth at bedtime. 09/23/21 09/18/22  Alric Ran, MD  triamcinolone ointment (KENALOG) 0.1 % Apply 1 application. topically as needed (to affected areas- for psoriasis flares). 01/21/21   [provider]  FLUoxetine (PROZAC) 20 MG capsule Take 1 capsule (20 mg total) by mouth daily. 09/21/21 09/28/21  Merian Capron, MD      Allergies    Dilaudid [hydromorphone], Bactrim [sulfamethoxazole-trimethoprim], Benadryl [diphenhydramine], Phenobarbital, and Penicillins    Review of Systems   Review of Systems  All other systems reviewed and are negative.  Physical Exam Updated Vital Signs BP 111/72   Pulse 99   Temp 98.9 F (37.2 C) (Oral)   Resp (!) 23   Ht _0  (1.854 m)   Wt 117.9 kg   SpO2 96%   BMI 34.30 kg/m  Physical Exam Vitals and nursing note reviewed.  Constitutional:      Appearance: He is well-developed.  HENT:     Head: Atraumatic.  Eyes:     Extraocular Movements: Extraocular movements intact.     Pupils: Pupils are equal, round, and reactive to light.  Cardiovascular:     Rate and Rhythm: Normal rate.  Pulmonary:     Effort: Pulmonary effort is normal.     Breath sounds: Rhonchi present.  Musculoskeletal:        General: No swelling or tenderness.     Cervical back: Neck supple.     Right lower leg: No edema.     Left lower leg: No edema.  Skin:    General: Skin is warm.  Neurological:     Mental Status: He is alert and oriented to person, place, and time.    ED Results / Procedures / Treatments   Labs (all labs ordered are  listed, but only abnormal results are displayed) Labs Reviewed  COMPREHENSIVE METABOLIC PANEL - Abnormal; Notable for the following components:      Result Value   Glucose, Bld 128 (*)    Calcium 8.5 (*)    Albumin 2.8 (*)    All other components within normal limits  CBC WITH DIFFERENTIAL/PLATELET - Abnormal; Notable for the following components:   WBC 13.3 (*)    RBC 3.06 (*)    Hemoglobin 10.4 (*)    HCT 30.8 (*)    MCV 100.7 (*)    RDW 15.7 (*)    Neutro Abs 10.7 (*)    Monocytes  Absolute 1.3 (*)    Abs Immature Granulocytes 0.09 (*)    All other components within normal limits  CULTURE, BLOOD (ROUTINE X 2)  CULTURE, BLOOD (ROUTINE X 2)  RESP PANEL BY RT-PCR (FLU A&B, COVID) ARPGX2  MRSA NEXT GEN BY PCR, NASAL  LACTIC ACID, PLASMA  PROTIME-INR  URINALYSIS, ROUTINE W REFLEX MICROSCOPIC    EKG None  Radiology DG Chest Port 1 View  Result Date: 10/03/2021 CLINICAL DATA:  Dry cough for 1 week EXAM: PORTABLE CHEST 1 VIEW COMPARISON:  09/28/2021 FINDINGS: Left chest cardiac device with leads overlying the right atrium and ventricle. Increased heterogeneous opacities in the left lung, now involving the left upper lung. No pleural effusion or pneumothorax. Unchanged cardiac and mediastinal contours. No acute osseous abnormality. IMPRESSION: Increased heterogeneous opacities in the left lung, now involving the left upper lung, concerning for worsening pneumonia. Electronically Signed   By: Merilyn Baba M.D.   On: 10/03/2021 13:06    Procedures .Critical Care Performed by: Varney Biles, MD Authorized by: Varney Biles, MD   Critical care provider statement:    Critical care time (minutes):  48   Critical care was necessary to treat or prevent imminent or life-threatening deterioration of the following conditions:  Respiratory failure   Critical care was time spent personally by me on the following activities:  Development of treatment plan with patient or surrogate,  discussions with consultants, evaluation of patient's response to treatment, examination of patient, ordering and review of laboratory studies, ordering and review of radiographic studies, ordering and performing treatments and interventions, pulse oximetry, re-evaluation of patient's condition and review of old charts Angiocath insertion  Date/Time: 10/03/2021 3:53 PM Performed by: Varney Biles, MD Authorized by: Varney Biles, MD  Consent: Verbal consent obtained. Risks and benefits: risks, benefits and alternatives were discussed Consent given by: patient Patient understanding: patient states understanding of the procedure being performed Patient identity confirmed: arm band Time out: Immediately prior to procedure a "time out" was called to verify the correct patient, procedure, equipment, support staff and site/side marked as required. Preparation: Patient was prepped and draped in the usual sterile fashion. Local anesthesia used: no  Anesthesia: Local anesthesia used: no  Sedation: Patient sedated: no       Medications Ordered in ED Medications  lactated ringers infusion (has no administration in time range)  sodium chloride 0.9 % bolus 1,000 mL (1,000 mLs Intravenous New Bag/Given 10/03/21 1514)  azithromycin (ZITHROMAX) 500 mg in sodium chloride 0.9 % 250 mL IVPB (has no administration in time range)  vancomycin (VANCOCIN) IVPB 1000 mg/200 mL premix (has no administration in time range)    Followed by  vancomycin (VANCOCIN) IVPB 1000 mg/200 mL premix (has no administration in time range)  ceFEPIme (MAXIPIME) 2 g in sodium chloride 0.9 % 100 mL IVPB (2 g Intravenous New Bag/Given 10/03/21 1520)  vancomycin (VANCOREADY) IVPB 1500 mg/300 mL (has no administration in time range)  0.9 %  sodium chloride infusion ( Intravenous New Bag/Given 10/03/21 1517)    ED Course/ Medical Decision Making/ A&P                           Medical Decision Making Amount and/or Complexity of  Data Reviewed Labs: ordered. Radiology: ordered. ECG/medicine tests: ordered.  Risk Prescription drug management. Decision regarding hospitalization.   This patient presents to the ED with chief complaint(s) of cough, shortness of breath with pertinent past medical history of COVID-19 with  recurrent CAP which further complicates the presenting complaint. The complaint involves an extensive differential diagnosis and also carries with it a high risk of complications and morbidity.    The differential diagnosis includes CAP, PE, CHF.   The initial plan is to initiate code sepsis and start antibiotics   Reassessment: Nursing staff have tried multiple times to secure an IV, unsuccessful. I placed peripheral IV myself.  Advise starting antibiotics which is 1 blood culture, and to not delay antibiotics.  I reviewed patient's x-ray independently, shows worsening left-sided pneumonia.   Additional history obtained: Additional history obtained from spouse Records reviewed previous admission documents  Independent labs interpretation:  The following labs were independently interpreted: L08 white count at 13.3.  Normal renal function.  Independent visualization of imaging: - I independently visualized the following imaging with scope of interpretation limited to determining acute life threatening conditions related to emergency care: X-ray of the chest, which revealed left-sided pneumonia covering multiple lung fields  Treatment and Reassessment: IV antibiotics initiated.  Patient already on oxygen.  Feels comfortable at this time.  Awaiting admission.  Final Clinical Impression(s) / ED Diagnoses Final diagnoses:  Community acquired pneumonia of right lung, unspecified part of lung  Acute respiratory failure with hypoxia [facility])    Rx / DC Orders ED Discharge Orders     None         Varney Biles, MD 10/03/21 1553

## 2021-10-03 NOTE — ED Notes (Signed)
Attempt x 2 with [place] to start IV by Trudee Grip, RN; unable to obtain blood or IV; EDP aware; pt sts he will not allow many more attempts because he is tired of being stuck.

## 2021-10-03 NOTE — ED Notes (Addendum)
BC X 1 OBTAINED

## 2021-10-03 NOTE — Progress Notes (Signed)
Brief note: Patient is a 71 year old male with past medical history significant for COVID infection with recurrent pneumonia, HTN, CAD s/p stent to LAD, CVA, MVP, PAF s/p watchman device, vascular dementia, CKD stage 3, HLD, and hx of orthostatic hypotension on midodrine.  Patient was recently discharged from the hospital to after inpatient care for recurrent pneumonia, sepsis, and acute hypoxic respiratory failure.   Patient presents with worsening shortness of breath and clinical syndrome suggestive of recurrent pneumonia.  Hospitalist team has been asked to admit patient for further work-up and management.  Patient will be transferred from the freestanding emergency room.

## 2021-10-04 ENCOUNTER — Ambulatory Visit: Payer: Medicare Other | Admitting: Pulmonary Disease

## 2021-10-04 ENCOUNTER — Encounter (HOSPITAL_COMMUNITY): Payer: Self-pay | Admitting: Internal Medicine

## 2021-10-04 DIAGNOSIS — I130 Hypertensive heart and chronic kidney disease with heart failure and stage 1 through stage 4 chronic kidney disease, or unspecified chronic kidney disease: Secondary | ICD-10-CM | POA: Diagnosis present

## 2021-10-04 DIAGNOSIS — J189 Pneumonia, unspecified organism: Secondary | ICD-10-CM | POA: Diagnosis present

## 2021-10-04 DIAGNOSIS — Z950 Presence of cardiac pacemaker: Secondary | ICD-10-CM

## 2021-10-04 DIAGNOSIS — Z8616 Personal history of COVID-19: Secondary | ICD-10-CM | POA: Diagnosis not present

## 2021-10-04 DIAGNOSIS — I480 Paroxysmal atrial fibrillation: Secondary | ICD-10-CM | POA: Diagnosis present

## 2021-10-04 DIAGNOSIS — Z8673 Personal history of transient ischemic attack (TIA), and cerebral infarction without residual deficits: Secondary | ICD-10-CM | POA: Diagnosis not present

## 2021-10-04 DIAGNOSIS — R042 Hemoptysis: Secondary | ICD-10-CM | POA: Diagnosis not present

## 2021-10-04 DIAGNOSIS — F0154 Vascular dementia, unspecified severity, with anxiety: Secondary | ICD-10-CM | POA: Diagnosis present

## 2021-10-04 DIAGNOSIS — G250 Essential tremor: Secondary | ICD-10-CM | POA: Diagnosis present

## 2021-10-04 DIAGNOSIS — J168 Pneumonia due to other specified infectious organisms: Secondary | ICD-10-CM | POA: Diagnosis not present

## 2021-10-04 DIAGNOSIS — J9811 Atelectasis: Secondary | ICD-10-CM | POA: Diagnosis present

## 2021-10-04 DIAGNOSIS — I471 Supraventricular tachycardia: Secondary | ICD-10-CM | POA: Diagnosis present

## 2021-10-04 DIAGNOSIS — R21 Rash and other nonspecific skin eruption: Secondary | ICD-10-CM | POA: Diagnosis present

## 2021-10-04 DIAGNOSIS — I10 Essential (primary) hypertension: Secondary | ICD-10-CM | POA: Diagnosis not present

## 2021-10-04 DIAGNOSIS — I495 Sick sinus syndrome: Secondary | ICD-10-CM | POA: Diagnosis present

## 2021-10-04 DIAGNOSIS — K219 Gastro-esophageal reflux disease without esophagitis: Secondary | ICD-10-CM | POA: Diagnosis present

## 2021-10-04 DIAGNOSIS — J440 Chronic obstructive pulmonary disease with acute lower respiratory infection: Secondary | ICD-10-CM | POA: Diagnosis present

## 2021-10-04 DIAGNOSIS — I5032 Chronic diastolic (congestive) heart failure: Secondary | ICD-10-CM | POA: Diagnosis present

## 2021-10-04 DIAGNOSIS — R627 Adult failure to thrive: Secondary | ICD-10-CM | POA: Diagnosis present

## 2021-10-04 DIAGNOSIS — I350 Nonrheumatic aortic (valve) stenosis: Secondary | ICD-10-CM | POA: Diagnosis present

## 2021-10-04 DIAGNOSIS — I951 Orthostatic hypotension: Secondary | ICD-10-CM

## 2021-10-04 DIAGNOSIS — F32A Depression, unspecified: Secondary | ICD-10-CM | POA: Diagnosis present

## 2021-10-04 DIAGNOSIS — J181 Lobar pneumonia, unspecified organism: Secondary | ICD-10-CM | POA: Diagnosis present

## 2021-10-04 DIAGNOSIS — R040 Epistaxis: Secondary | ICD-10-CM | POA: Diagnosis not present

## 2021-10-04 DIAGNOSIS — N182 Chronic kidney disease, stage 2 (mild): Secondary | ICD-10-CM | POA: Diagnosis present

## 2021-10-04 DIAGNOSIS — I2510 Atherosclerotic heart disease of native coronary artery without angina pectoris: Secondary | ICD-10-CM

## 2021-10-04 DIAGNOSIS — I252 Old myocardial infarction: Secondary | ICD-10-CM | POA: Diagnosis not present

## 2021-10-04 DIAGNOSIS — E876 Hypokalemia: Secondary | ICD-10-CM | POA: Diagnosis not present

## 2021-10-04 DIAGNOSIS — Z20822 Contact with and (suspected) exposure to covid-19: Secondary | ICD-10-CM | POA: Diagnosis present

## 2021-10-04 DIAGNOSIS — J9601 Acute respiratory failure with hypoxia: Secondary | ICD-10-CM | POA: Diagnosis present

## 2021-10-04 LAB — BASIC METABOLIC PANEL
Anion gap: 9 (ref 5–15)
BUN: 8 mg/dL (ref 8–23)
CO2: 23 mmol/L (ref 22–32)
Calcium: 8.3 mg/dL — ABNORMAL LOW (ref 8.9–10.3)
Chloride: 106 mmol/L (ref 98–111)
Creatinine, Ser: 0.99 mg/dL (ref 0.61–1.24)
GFR, Estimated: >60 mL/min (ref >60)
Glucose, Bld: 113 mg/dL — ABNORMAL HIGH (ref 70–99)
Potassium: 3.2 mmol/L — ABNORMAL LOW (ref 3.5–5.1)
Sodium: 138 mmol/L (ref 135–145)

## 2021-10-04 LAB — CBC
HCT: 29.1 % — ABNORMAL LOW (ref 39.0–52.0)
Hemoglobin: 9.4 g/dL — ABNORMAL LOW (ref 13.0–17.0)
MCH: 33.1 pg (ref 26.0–34.0)
MCHC: 32.3 g/dL (ref 30.0–36.0)
MCV: 102.5 fL — ABNORMAL HIGH (ref 80.0–100.0)
Platelets: 188 10*3/uL (ref 150–400)
RBC: 2.84 MIL/uL — ABNORMAL LOW (ref 4.22–5.81)
RDW: 15.2 % (ref 11.5–15.5)
WBC: 10.6 10*3/uL — ABNORMAL HIGH (ref 4.0–10.5)
nRBC: 0 % (ref 0.0–0.2)

## 2021-10-04 MED ORDER — ALLOPURINOL 100 MG PO TABS
100.0000 mg | ORAL_TABLET | Freq: Every day | ORAL | Status: DC
  Administered 2021-10-05 – 2021-10-14 (×10): 100 mg via ORAL
  Filled 2021-10-04 (×10): qty 1

## 2021-10-04 MED ORDER — OXYCODONE HCL 5 MG PO TABS
5.0000 mg | ORAL_TABLET | Freq: Once | ORAL | Status: AC | PRN
  Administered 2021-10-04: 5 mg via ORAL
  Filled 2021-10-04: qty 1

## 2021-10-04 MED ORDER — CHOLECALCIFEROL 10 MCG (400 UNIT) PO TABS
400.0000 [IU] | ORAL_TABLET | Freq: Every day | ORAL | Status: DC
  Administered 2021-10-05 – 2021-10-14 (×10): 400 [IU] via ORAL
  Filled 2021-10-04 (×10): qty 1

## 2021-10-04 MED ORDER — OXYCODONE-ACETAMINOPHEN 5-325 MG PO TABS
0.5000 | ORAL_TABLET | Freq: Once | ORAL | Status: AC
  Administered 2021-10-04: 0.5 via ORAL

## 2021-10-04 MED ORDER — MIDODRINE HCL 5 MG PO TABS
5.0000 mg | ORAL_TABLET | Freq: Three times a day (TID) | ORAL | Status: DC
  Administered 2021-10-05 – 2021-10-09 (×12): 5 mg via ORAL
  Filled 2021-10-04 (×14): qty 1

## 2021-10-04 MED ORDER — MORPHINE SULFATE (PF) 4 MG/ML IV SOLN
4.0000 mg | Freq: Once | INTRAVENOUS | Status: AC
  Administered 2021-10-04: 4 mg via INTRAVENOUS

## 2021-10-04 MED ORDER — LATANOPROST 0.005 % OP SOLN
1.0000 [drp] | Freq: Every day | OPHTHALMIC | Status: DC
Start: 2021-10-04 — End: 2021-10-14
  Administered 2021-10-04 – 2021-10-13 (×10): 1 [drp] via OPHTHALMIC
  Filled 2021-10-04: qty 2.5

## 2021-10-04 MED ORDER — VANCOMYCIN HCL 500 MG IV SOLR
INTRAVENOUS | Status: AC
  Filled 2021-10-04: qty 10

## 2021-10-04 MED ORDER — ESCITALOPRAM OXALATE 20 MG PO TABS
30.0000 mg | ORAL_TABLET | Freq: Every day | ORAL | Status: DC
  Administered 2021-10-04 – 2021-10-14 (×11): 30 mg via ORAL
  Filled 2021-10-04 (×11): qty 1

## 2021-10-04 MED ORDER — MAGNESIUM OXIDE -MG SUPPLEMENT 400 (240 MG) MG PO TABS
400.0000 mg | ORAL_TABLET | Freq: Every day | ORAL | Status: DC
  Administered 2021-10-04 – 2021-10-14 (×11): 400 mg via ORAL
  Filled 2021-10-04 (×11): qty 1

## 2021-10-04 MED ORDER — ROPINIROLE HCL ER 4 MG PO TB24
4.0000 mg | ORAL_TABLET | Freq: Every day | ORAL | Status: DC
  Administered 2021-10-04 – 2021-10-13 (×10): 4 mg via ORAL
  Filled 2021-10-04 (×12): qty 1

## 2021-10-04 MED ORDER — FLUDROCORTISONE ACETATE 0.1 MG PO TABS
0.1000 mg | ORAL_TABLET | Freq: Two times a day (BID) | ORAL | Status: DC
  Administered 2021-10-04 – 2021-10-09 (×11): 0.1 mg via ORAL
  Filled 2021-10-04 (×13): qty 1

## 2021-10-04 MED ORDER — IPRATROPIUM-ALBUTEROL 0.5-2.5 (3) MG/3ML IN SOLN
3.0000 mL | Freq: Four times a day (QID) | RESPIRATORY_TRACT | Status: DC | PRN
  Administered 2021-10-04 – 2021-10-14 (×6): 3 mL via RESPIRATORY_TRACT
  Filled 2021-10-04 (×7): qty 3

## 2021-10-04 MED ORDER — RANOLAZINE ER 500 MG PO TB12
1000.0000 mg | ORAL_TABLET | Freq: Two times a day (BID) | ORAL | Status: DC
  Administered 2021-10-04 – 2021-10-14 (×20): 1000 mg via ORAL
  Filled 2021-10-04 (×23): qty 2

## 2021-10-04 MED ORDER — HYDROCOD POLI-CHLORPHE POLI ER 10-8 MG/5ML PO SUER: 5.0000 mL | Freq: Two times a day (BID) | ORAL | Status: DC

## 2021-10-04 MED ORDER — ENOXAPARIN SODIUM 40 MG/0.4ML IJ SOSY
40.0000 mg | PREFILLED_SYRINGE | INTRAMUSCULAR | Status: DC
  Administered 2021-10-04 – 2021-10-13 (×10): 40 mg via SUBCUTANEOUS
  Filled 2021-10-04 (×10): qty 0.4

## 2021-10-04 MED ORDER — MORPHINE SULFATE (PF) 4 MG/ML IV SOLN
INTRAVENOUS | Status: AC
  Filled 2021-10-04: qty 1

## 2021-10-04 MED ORDER — HYDROCOD POLI-CHLORPHE POLI ER 10-8 MG/5ML PO SUER
5.0000 mL | Freq: Two times a day (BID) | ORAL | Status: DC
  Administered 2021-10-04 – 2021-10-07 (×6): 5 mL via ORAL
  Filled 2021-10-04 (×6): qty 5

## 2021-10-04 MED ORDER — PRIMIDONE 50 MG PO TABS
100.0000 mg | ORAL_TABLET | Freq: Two times a day (BID) | ORAL | Status: DC
  Administered 2021-10-04 – 2021-10-09 (×11): 100 mg via ORAL
  Administered 2021-10-10: 50 mg via ORAL
  Administered 2021-10-10: 100 mg via ORAL
  Administered 2021-10-10: 50 mg via ORAL
  Administered 2021-10-11 – 2021-10-14 (×7): 100 mg via ORAL
  Filled 2021-10-04 (×23): qty 2

## 2021-10-04 MED ORDER — VANCOMYCIN HCL 1.5 G IV SOLR
1500.0000 mg | Freq: Once | INTRAVENOUS | Status: AC
  Administered 2021-10-04: 1500 mg via INTRAVENOUS
  Filled 2021-10-04: qty 30

## 2021-10-04 MED ORDER — VANCOMYCIN HCL 1000 MG IV SOLR
INTRAVENOUS | Status: AC
  Filled 2021-10-04: qty 20

## 2021-10-04 MED ORDER — B COMPLEX VITAMINS PO CAPS: 1.0000 | ORAL_CAPSULE | Freq: Every day | ORAL | Status: DC

## 2021-10-04 MED ORDER — MEMANTINE HCL 10 MG PO TABS
10.0000 mg | ORAL_TABLET | Freq: Two times a day (BID) | ORAL | Status: DC
  Administered 2021-10-04 – 2021-10-14 (×19): 10 mg via ORAL
  Filled 2021-10-04 (×21): qty 1

## 2021-10-04 MED ORDER — PANTOPRAZOLE SODIUM 40 MG PO TBEC
40.0000 mg | DELAYED_RELEASE_TABLET | Freq: Every evening | ORAL | Status: DC
  Administered 2021-10-04 – 2021-10-13 (×10): 40 mg via ORAL
  Filled 2021-10-04 (×10): qty 1

## 2021-10-04 MED ORDER — LIDOCAINE 5 % EX PTCH
MEDICATED_PATCH | CUTANEOUS | Status: AC
  Filled 2021-10-04: qty 1

## 2021-10-04 MED ORDER — ATORVASTATIN CALCIUM 80 MG PO TABS
80.0000 mg | ORAL_TABLET | Freq: Every day | ORAL | Status: DC
  Administered 2021-10-05 – 2021-10-14 (×10): 80 mg via ORAL
  Filled 2021-10-04 (×10): qty 1

## 2021-10-04 MED ORDER — LIDOCAINE 5 % EX PTCH
1.0000 | MEDICATED_PATCH | CUTANEOUS | Status: DC
  Administered 2021-10-04 – 2021-10-07 (×2): 1 via TRANSDERMAL
  Filled 2021-10-04 (×9): qty 1

## 2021-10-04 MED ORDER — MOMETASONE FURO-FORMOTEROL FUM 200-5 MCG/ACT IN AERO
2.0000 | INHALATION_SPRAY | Freq: Two times a day (BID) | RESPIRATORY_TRACT | Status: DC
Start: 2021-10-04 — End: 2021-10-14
  Administered 2021-10-05 – 2021-10-14 (×19): 2 via RESPIRATORY_TRACT
  Filled 2021-10-04 (×2): qty 8.8

## 2021-10-04 MED ORDER — HYDROXYZINE HCL 25 MG PO TABS
25.0000 mg | ORAL_TABLET | Freq: Once | ORAL | Status: AC
  Administered 2021-10-04: 25 mg via ORAL
  Filled 2021-10-04: qty 1

## 2021-10-04 MED ORDER — GUAIFENESIN 100 MG/5ML PO LIQD
5.0000 mL | ORAL | Status: DC | PRN
  Administered 2021-10-04 – 2021-10-08 (×7): 5 mL via ORAL
  Filled 2021-10-04 (×5): qty 5
  Filled 2021-10-04 (×2): qty 10

## 2021-10-04 MED ORDER — ONDANSETRON HCL 4 MG/2ML IJ SOLN: 4.0000 mg | Freq: Once | INTRAMUSCULAR | Status: DC

## 2021-10-04 MED ORDER — ACETAMINOPHEN 500 MG PO TABS
1000.0000 mg | ORAL_TABLET | Freq: Once | ORAL | Status: AC
Start: 2021-10-04 — End: 2021-10-04
  Administered 2021-10-04: 1000 mg via ORAL
  Filled 2021-10-04: qty 2

## 2021-10-04 MED ORDER — OXYCODONE-ACETAMINOPHEN 5-325 MG PO TABS
ORAL_TABLET | ORAL | Status: AC
  Filled 2021-10-04: qty 1

## 2021-10-04 MED ORDER — POTASSIUM CHLORIDE 10 MEQ/100ML IV SOLN
10.0000 meq | INTRAVENOUS | Status: AC
  Administered 2021-10-04 (×2): 10 meq via INTRAVENOUS
  Filled 2021-10-04 (×2): qty 100

## 2021-10-04 MED ORDER — ASPIRIN 81 MG PO TBEC
81.0000 mg | DELAYED_RELEASE_TABLET | Freq: Every morning | ORAL | Status: DC
  Administered 2021-10-05 – 2021-10-14 (×10): 81 mg via ORAL
  Filled 2021-10-04 (×10): qty 1

## 2021-10-04 MED ORDER — ACETAMINOPHEN 325 MG PO TABS
650.0000 mg | ORAL_TABLET | Freq: Four times a day (QID) | ORAL | Status: DC | PRN
  Administered 2021-10-04 – 2021-10-13 (×6): 650 mg via ORAL
  Filled 2021-10-04 (×6): qty 2

## 2021-10-04 MED ORDER — METOPROLOL TARTRATE 12.5 MG HALF TABLET
12.5000 mg | ORAL_TABLET | Freq: Two times a day (BID) | ORAL | Status: DC
  Administered 2021-10-04 – 2021-10-09 (×11): 12.5 mg via ORAL
  Filled 2021-10-04 (×11): qty 1

## 2021-10-04 MED ORDER — CLOPIDOGREL BISULFATE 75 MG PO TABS
75.0000 mg | ORAL_TABLET | Freq: Every day | ORAL | Status: DC
  Administered 2021-10-04 – 2021-10-14 (×11): 75 mg via ORAL
  Filled 2021-10-04 (×11): qty 1

## 2021-10-04 MED ORDER — POTASSIUM CITRATE ER 10 MEQ (1080 MG) PO TBCR
10.0000 meq | EXTENDED_RELEASE_TABLET | Freq: Every day | ORAL | Status: DC
  Administered 2021-10-04 – 2021-10-14 (×11): 10 meq via ORAL
  Filled 2021-10-04 (×11): qty 1

## 2021-10-04 MED ORDER — METHYLPREDNISOLONE SODIUM SUCC 125 MG IJ SOLR
60.0000 mg | Freq: Every day | INTRAMUSCULAR | Status: DC
Start: 2021-10-04 — End: 2021-10-05
  Administered 2021-10-04 – 2021-10-05 (×2): 60 mg via INTRAVENOUS
  Filled 2021-10-04 (×2): qty 2

## 2021-10-04 NOTE — ED Notes (Signed)
Offered patient food, does not want anything at this time. Complaining of being uncomfortable in the bed and back is hurting. EDP notified

## 2021-10-04 NOTE — Assessment & Plan Note (Signed)
Stable.

## 2021-10-04 NOTE — Consult Note (Addendum)
The patient has been seen in conjunction with Cecilie Kicks, NP-C. All aspects of care have been considered and discussed. The patient has been personally interviewed, examined, and all clinical data has been reviewed.  Complex patient in whom we are consulted for "atrial fibrillation".  Upon further interrogation of the rhythm strips, the patient is having sinus tachycardia with interspersed episodes of supraventricular tachycardia -not atrial fibrillation.  Suspect ectopic atrial tachycardia occurring and nonsustained salvos.  He has clear P waves on most rhythm strips and there are also periods of atrial pacing. He has been started on amiodarone which will not lower the blood pressure, may help suppress some of the atrial ectopic activity, but could be a source of torsade the point if potassium level is not maintained above 3.8-4.  Potassium needs to be repleted. Furthermore, the patient's heart rate is being driven by fever and his underlying respiratory illness.  Once this is controlled, the heart rate/rhythm disturbance will improve. While examining the the patient, he had hemoptysis (phlegm with blood). We have recommended repleting potassium, continuing amiodarone, and starting metoprolol tartrate 12.5 mg twice daily. We will follow with you but have no further complaints other than aggressive management of the underlying infectious process.  He does not appear to be wet.      Cardiology Consultation:   Patient ID: Mark Thomas MRN: [id number]; DOB: 1950-11-16  Admit date: 10/03/2021 Date of Consult: 10/04/2021  PCP:  Luetta Nutting, DO   [facility] Providers Cardiologist:  Jenne Campus, MD        Patient Profile:   Mark Thomas is a 71 y.o. male with a hx of CAD and PTCA to LAD, ASD closure,  hx of DDD PPM 2018, hx of watchman 03/2019, RFA with isolation of all 4 pulmonary veins, 2021, successful closure of ASD 01/22/20, hx CVA, HTN, mod AS, CAD with hx MI, hypotension  on midodrine who is being seen 10/04/2021 for the evaluation of atrial fib at the request of Dr. Flossie Buffy.  History of Present Illness:   [name] with above hx including CAD with stents X 5, last cath 2018 with medical therapy.  hx of MI X 4, MDT PPM DDD 2018 with SSS, a fib with watchman device 2020, ASD closure,  a fib ablation 2021 of all 4 pulmonary veins,  moderate AS by echo 08/27/21, EF 70-75% LV hyperdynamic function, mod LVH, G2 DD, moderately elevated pulmonary artery systolic pressure, RV systolic pressure 25.8 mmHg LA size was moderately dilated.   Aortic valve area, by VTI measures 0.83 cm. Aortic valve mean gradient measures 33.0 mmHg. Aortic valve Vmax measures 3.54 m/s.    Pt was seen by Dr. Agustin Cree in 07/2021 to establish as new pt.  BP was low on midodrine so dose increased.  On midodrine 2.5 TID. He is on ASA and plavix. Ranexa, with BP issues no imdur. + COPD.  Pt presented to ER 10/03/21 with cough and recurrent PNA. Recent discharge from PNA.  He has worsening SOB.  Symptoms increasing.  Had been on ABX and steroids off and on since Dec.  Chest x-ray shows increase opacity in the left lung not involving the left upper lung concerning for worsening pneumonia. Started on IV abx.    He was noted to be in A fib with RVR that began overnight.  Though it appears SR with atrial pacing at times SVT  he has not been able to tolerate meds that affect BP.  But currently BP  is excellent.   EKG:  The EKG was personally reviewed and demonstrates:  SR with PACs atrially paced freq   Telemetry:  Telemetry was personally reviewed and demonstrates:  atrial pacing with PACs and SVT.  No atrial fib.  May have possible a flutter but doubt.    BP 152/115 P 121 to 140  on amiodarone.   Past Medical History:  Diagnosis Date   Anxiety    Coronary artery disease    Depression    Essential tremor    GERD (gastroesophageal reflux disease)    High cholesterol    Hypertension    Myocardial infarct (HCC)     Orthostatic hypotension    Stroke ([place])    Vascular dementia ([place])     Past Surgical History:  Procedure Laterality Date   APPENDECTOMY     CARDIAC SURGERY     CHOLECYSTECTOMY     HERNIA REPAIR     NASAL SINUS SURGERY     SHOULDER SURGERY       Home Medications:  Prior to Admission medications   Medication Sig Start Date End Date Taking? Authorizing Provider  albuterol (PROVENTIL) (2.5 MG/3ML) 0.083% nebulizer solution Take 3 mLs (2.5 mg total) by nebulization every 6 (six) hours as needed for wheezing or shortness of breath. Patient taking differently: Take 2.5 mg by nebulization every 4 (four) hours as needed for shortness of breath. 07/15/21  Yes Luetta Nutting, DO  albuterol (VENTOLIN HFA) 108 (90 Base) MCG/ACT inhaler Inhale 2 puffs into the lungs every 6 (six) hours as needed for wheezing or shortness of breath.   Yes [provider]  allopurinol (ZYLOPRIM) 100 MG tablet Take 100 mg by mouth daily. 09/22/20  Yes [provider]  aspirin 81 MG EC tablet Take 81 mg by mouth daily. 05/05/14  Yes [provider]  atorvastatin (LIPITOR) 80 MG tablet Take 80 mg by mouth daily. 11/18/20  Yes [provider]  azithromycin (ZITHROMAX) 250 MG tablet Take 2 tablets on day 1, then 1 tablet daily on days 2 through 5 Patient taking differently: Take 250 mg by mouth See admin instructions. Take 2 tablets by mouth on day 1, then take 1 tablet by mouth daily on days 2 through 5. 09/30/21 10/05/21 Yes Jessup, Joy, NP  b complex vitamins capsule Take 1 capsule by mouth daily.   Yes [provider]  Cholecalciferol (VITAMIN D3) 10 MCG (400 UNIT) CAPS Take 400 Units by mouth in the morning.   Yes [provider]  clopidogrel (PLAVIX) 75 MG tablet Take 75 mg by mouth daily. 11/18/20  Yes [provider]  escitalopram (LEXAPRO) 20 MG tablet Take 1.5 tablets (30 mg total) by mouth daily. 09/28/21  Yes Merian Capron, MD  FLUDROCORTISONE ACETATE PO  Take 0.1 mg by mouth in the morning and at bedtime.   Yes [provider]  latanoprost (XALATAN) 0.005 % ophthalmic solution Place 1 drop into both eyes at bedtime. 12/07/20  Yes [provider]  magnesium oxide (MAG-OX) 400 MG tablet Take 400 mg by mouth daily.   Yes [provider]  memantine (NAMENDA) 10 MG tablet TAKE 1 TABLET TWICE A DAY Patient taking differently: Take 10 mg by mouth 2 (two) times daily. 09/21/21  Yes Camara, Maryan Puls, MD  midodrine (PROAMATINE) 5 MG tablet Take 1 tablet (5 mg total) by mouth 3 (three) times daily with meals. Patient taking differently: Take 5 mg by mouth in the morning, at noon, in the evening, and at  bedtime. 09/24/21  Yes Park Liter, MD  nitroGLYCERIN (NITROLINGUAL) 0.4 MG/SPRAY spray Place 1 spray under the tongue every 5 (five) minutes x 3 doses as needed for chest pain. 05/05/14  Yes [provider]  pantoprazole (PROTONIX) 40 MG tablet Take 1 tablet (40 mg total) by mouth every evening. 07/07/21  Yes Luetta Nutting, DO  potassium citrate (UROCIT-K) 10 MEQ (1080 MG) SR tablet Take 10 mEq by mouth daily. 09/22/20  Yes [provider]  primidone (MYSOLINE) 50 MG tablet Take 2 tablets (100 mg total) by mouth 2 (two) times daily. 08/24/21 11/22/21 Yes Alric Ran, MD  promethazine-dextromethorphan (PROMETHAZINE-DM) 6.25-15 MG/5ML syrup Take 5 mLs by mouth 4 (four) times daily as needed for cough. 09/28/21  Yes Samuel Bouche, NP  QUEtiapine (SEROQUEL) 50 MG tablet Take 2 tablets (100 mg total) by mouth at bedtime. 09/28/21 12/27/21 Yes Merian Capron, MD  ranolazine (RANEXA) 1000 MG SR tablet Take 1,000 mg by mouth in the morning and at bedtime.   Yes [provider]  rOPINIRole (REQUIP XL) 4 MG 24 hr tablet Take 1 tablet (4 mg total) by mouth at bedtime. 09/23/21 09/18/22 Yes Camara, Maryan Puls, MD  triamcinolone ointment (KENALOG) 0.1 % Apply 1 application. topically daily as needed (to affected areas- for psoriasis  flares). 01/21/21  Yes [provider]  ferrous sulfate 325 (65 FE) MG EC tablet Take 1 tablet (325 mg total) by mouth in the morning and at bedtime. 03/19/21 09/13/21  Luetta Nutting, DO  mometasone-formoterol (DULERA) 200-5 MCG/ACT AERO Inhale 2 puffs into the lungs 2 (two) times daily. 09/14/21   Freddi Starr, MD  FLUoxetine (PROZAC) 20 MG capsule Take 1 capsule (20 mg total) by mouth daily. 09/21/21 09/28/21  Merian Capron, MD    Inpatient Medications: Scheduled Meds:  [START ON 10/05/2021] allopurinol  100 mg Oral Daily   [START ON 10/05/2021] aspirin EC  81 mg Oral q AM   [START ON 10/05/2021] atorvastatin  80 mg Oral Daily   chlorpheniramine-HYDROcodone  5 mL Oral Q12H   enoxaparin (LOVENOX) injection  40 mg Subcutaneous Q24H   lidocaine  1 patch Transdermal Q24H   lidocaine       midodrine  5 mg Oral TID WC   morphine (PF)       ondansetron (ZOFRAN) IV  4 mg Intravenous Once   oxyCODONE-acetaminophen       QUEtiapine  100 mg Oral QHS   vancomycin       vancomycin       Continuous Infusions:  sodium chloride 10 mL/hr at 10/04/21 0539   amiodarone 30 mg/hr (10/04/21 0539)   ceFEPime (MAXIPIME) IV Stopped (10/04/21 1127)   vancomycin     PRN Meds: sodium chloride, acetaminophen, guaiFENesin  Allergies:    Allergies  Allergen Reactions   Dilaudid [Hydromorphone] Other (See Comments)    Respiratory Arrest!!!!   Bactrim [Sulfamethoxazole-Trimethoprim] Rash   Benadryl [Diphenhydramine] Other (See Comments)    Muscle spasms   Phenobarbital Other (See Comments)    From childhood- reaction not recalled at this time   Penicillins Other (See Comments)    From childhood- reaction not recalled at this time    Social History:   Social History   Socioeconomic History   Marital status: Married    Spouse name: Tyreon Frigon   Number of children: 2   Years of education: 20   Highest education level: Master's degree (e.g., MA, MS, MEng, MEd, MSW, MBA)  Occupational  History  Occupation: Retired  Tobacco Use   Smoking status: Never    Passive exposure: Never   Smokeless tobacco: Never  Vaping Use   Vaping Use: Never used  Substance and Sexual Activity   Alcohol use: Not Currently   Drug use: Never   Sexual activity: Not on file  Other Topics Concern   Not on file  Social History Narrative   Lives with his wife. He has vascular dementia. His wife is his primary caretaker.He enjoys watching football and spending time with his grandchildren.   Social Determinants of Health   Financial Resource Strain: Low Risk    Difficulty of Paying Living Expenses: Not hard at all  Food Insecurity: No Food Insecurity   Worried About Charity fundraiser in the Last Year: Never true   [place] in the Last Year: Never true  Transportation Needs: No Transportation Needs   Lack of Transportation (Medical): No   Lack of Transportation (Non-Medical): No  Physical Activity: Insufficiently Active   Days of Exercise per Week: 2 days   Minutes of Exercise per Session: 50 min  Stress: No Stress Concern Present   Feeling of Stress : Not at all  Social Connections: Socially Integrated   Frequency of Communication with Friends and Family: More than three times a week   Frequency of Social Gatherings with Friends and Family: Once a week   Attends Religious Services: More than 4 times per year   Active Member of [employer] or Organizations: Yes   Attends Music therapist: More than 4 times per year   Marital Status: Married  Human resources officer Violence: Not At Risk   Fear of Current or Ex-Partner: No   Emotionally Abused: No   Physically Abused: No   Sexually Abused: No    Family History:    Family History  Problem Relation Age of Onset   Hypertension Mother    Stroke Mother    Stroke Father    Hypertension Father      ROS:  Please see the history of present illness.  General:+ PNA, no weight changes Skin:no rashes or ulcers HEENT:no blurred  vision, no congestion CV:see HPI PUL:see HPI GI:no diarrhea constipation or melena, no indigestion GU:no hematuria, no dysuria MS:no joint pain, no claudication Neuro:no syncope, no lightheadedness Endo:no diabetes, no thyroid disease  All other ROS reviewed and negative.     Physical Exam/Data:   Vitals:   10/04/21 1200 10/04/21 1338 10/04/21 1400 10/04/21 1553  BP: (!) 144/103 (!) 145/91 (!) 162/77 (!) 158/96  Pulse: (!) 111  (!) 114 (!) 127  Resp: (!) _0 (!) 23  Temp: 98 F (36.7 C) 98.6 F (37 C)  99.4 F (37.4 C)  TempSrc:  Oral  Oral  SpO2: 94% 96% 98% 95%  Weight:      Height:        Intake/Output Summary (Last 24 hours) at 10/04/2021 1611 Last data filed at 10/04/2021 1552 Gross per 24 hour  Intake 1230.34 ml  Output 510 ml  Net 720.34 ml      10/03/2021   12:29 PM 09/28/2021    2:31 PM 09/14/2021    1:26 PM  Last 3 Weights  Weight (lbs) 260 lb 260 lb 263 lb  Weight (kg) 117.935 kg 117.935 kg 119.296 kg     Body mass index is 34.3 kg/m.  Exam per Dr. Tamala Julian  General:  Well nourished, well developed, in no acute distress HEENT: normal Neck: no  JVD Vascular: No carotid bruits; Distal pulses 2+ bilaterally Cardiac: rapid; + murmur  Lungs:  clear to auscultation bilaterally, no wheezing, rhonchi or rales  Abd: soft, nontender, no hepatomegaly  Ext: no edema Musculoskeletal:  No deformities, BUE and BLE strength normal and equal Skin: warm and dry  Neuro:  CNs 2-12 intact, no focal abnormalities noted Psych:  Normal affect    Relevant CV Studies: Cardiac cath 2018 1. No significant change in anatomy, known occluded diagonal branch, mild  disease in LAD and circumflex.  Moderate diffuse disease of the PDA, my  impression is too small caliber and distal to stent at this time, I would  increase medical therapy first.    2. Normal left sided filling pressure.  3. No gradient across the aortic valve.  4. LV EF 60%.   CORONARY ANATOMY:  1. The left  main trunk has 10% stenosis.  2. The circumflex artery gives off a large high lateral branch that has  minimal disease.  There is a medium sized lateral OM with minimal disease  and a very small posterolateral OM.    3. The left anterior descending artery has stents in the proximal portion  with mild 20% or less in stent restenosis.  Mid LAD with mild disease.    First diagonal is stented and known to be occluded.  Small caliber mid to  distal vessel diagonal branch with ostial 60% stenosis.    4. The right coronary artery is dominant.  There is proximal 30% stenosis.   Mid vessel 20-30% stenosis.  The PDA is medium caliber.  There is  moderate diffuse disease from the mid to distal portion, 60-70%.     HEMODYNAMICS:  1. Left ventricular pressure 140/10 mm Hg.  2. Central aortic presssure 140/60 mm Hg.   LEFT VENTRICULOGRAM:  A left ventriculogram was performed in the RAO projection, using a 0.3  second rate of rise, 12 mL per second for 2 seconds.  The LV ejection  fraction is 60%.  LV wall motion is normal.  There is trace mitral  regurgitation.      Cardiac cath 2015  Coronary Angiography:   Dominance: Right dominant   Left Main: Left main is normal.   Left Anterior Descending: The LAD is a large caliber vessel reaching the  apex giving rise to a second diagonal.  The first diagonal is totally  occluded with into the stented portion.  This is a known finding.  The  proximal and mid LAD patent stents.  Between the stented segment is a  borderline stenosis.  This is estimated in the 70% range.  The distal LAD  has moderate disease.   Left Circumflex: The left circumflex follows 80 groove and gives rise to  an anterior, mid, and posterior marginal.  Left circumflex system has  minor luminal irregularities.   Right Coronary Artery: The RCA is a large dominant vessel terminating into  a PDA and left ventricular branch.  The RCA has minor luminal  irregularities.     IMPRESSION   1. LV Angiography: Not performed  2.  Single vessel CAD with borderline stenosis between the LAD stents  3.  Chronically occluded first diagonal in a the stented segment   PROCEDURE #2:  FFR of the LAD   #3  Coronary stenting of the LAD     Laboratory Data:  High Sensitivity Troponin:  No results for input(s): TROPONINIHS in the last 720 hours.   Chemistry Recent Labs  Lab  09/28/21 0000 10/03/21 1505 10/04/21 1436  NA 136 135 138  K 4.3 4.2 3.2*  CL 103 102 106  CO2 _0 GLUCOSE 158* 128* 113*  BUN _1 CREATININE 0.99 1.01 0.99  CALCIUM 8.7 8.5* 8.3*  GFRNONAA  --  >60 >60  ANIONGAP  --  8 9    Recent Labs  Lab 10/03/21 1505  PROT 7.1  ALBUMIN 2.8*  AST 26  ALT 20  ALKPHOS 56  BILITOT 1.0   Lipids No results for input(s): CHOL, TRIG, HDL, LABVLDL, LDLCALC, CHOLHDL in the last 168 hours.  Hematology Recent Labs  Lab 10/03/21 1505 10/04/21 1436  WBC 13.3* 10.6*  RBC 3.06* 2.84*  HGB 10.4* 9.4*  HCT 30.8* 29.1*  MCV 100.7* 102.5*  MCH 34.0 33.1  MCHC 33.8 32.3  RDW 15.7* 15.2  PLT 209 188   Thyroid No results for input(s): TSH, FREET4 in the last 168 hours.  BNP Recent Labs  Lab 09/28/21 0000  BNP 180*    DDimer No results for input(s): DDIMER in the last 168 hours.   Radiology/Studies:  DG Chest Port 1 View  Result Date: 10/03/2021 CLINICAL DATA:  Dry cough for 1 week EXAM: PORTABLE CHEST 1 VIEW COMPARISON:  09/28/2021 FINDINGS: Left chest cardiac device with leads overlying the right atrium and ventricle. Increased heterogeneous opacities in the left lung, now involving the left upper lung. No pleural effusion or pneumothorax. Unchanged cardiac and mediastinal contours. No acute osseous abnormality. IMPRESSION: Increased heterogeneous opacities in the left lung, now involving the left upper lung, concerning for worsening pneumonia. Electronically Signed   By: Merilyn Baba M.D.   On: 10/03/2021 13:06     Assessment and  Plan:   SR with freq PACs and SVt.  Hx of AFib ablation X 2 and Watchman device.   Historically have not bee able to use BB due to hypotension today hyper tensive.  Will add lopressor 12.5 to slow rate.  He is on amiodarone but with BP issues will leave overnight.  Will most likely stop in AM -  believe fever is driving HR and acute illness.  PNA with acute respiratory failure and hypoxia on ABX per IM Hx of atrial fib but with ablation X 2. Now atrial pacing and PACs and SVT.  With watchman device no anticoagulation PPM MDT for SSS.  CAD with prior stents on ASA plavix and ranexa and statin.-no angina Hx hypotension on midodrine and fludrocortisone.  Hx CVA and does have hx carotid stenosis.   Mod AS stable   Risk Assessment/Risk Scores:                For questions or updates, please contact [place] Please consult [email or web address] for contact info under    Signed, Cecilie Kicks, NP  10/04/2021 4:11 PM

## 2021-10-04 NOTE — Progress Notes (Signed)
pt coughed up good sized blood clot--stringy, normal red blood color--(not old blood appearing) Due to received plavix, Dr Flossie Buffy notified of coughing up blood clot and plavix due.  Dr Flossie Buffy messaged back ok to give plavix.

## 2021-10-04 NOTE — Assessment & Plan Note (Addendum)
-  History of ablation in 2018 and 2021 -s/p watchman device -pt was transferred over from [facility] on amiodarone overnight but HR remains elevated up to 140 with exertion -cardiology consulted since he is unlikely to tolerate beta blocker or CCB with his orthostatic hypotension. He also has mild ILD which makes him high risk for side effects with amiodarone. Appreciate recommendations.

## 2021-10-04 NOTE — Assessment & Plan Note (Signed)
Continue aspirin

## 2021-10-04 NOTE — Progress Notes (Signed)
   10/04/21 1400  Assess: MEWS Score  BP (!) 162/77  MAP (mmHg) 101  Pulse Rate (!) 114  ECG Heart Rate (!) 111  Resp 20  SpO2 98 %  Assess: MEWS Score  MEWS Temp 0  MEWS Systolic 0  MEWS Pulse 2  MEWS RR 0  MEWS LOC 0  MEWS Score 2  MEWS Score Color Yellow  Assess: if the MEWS score is Yellow or Red  Were vital signs taken at a resting state? Yes  Focused Assessment No change from prior assessment  Does the patient meet 2 or more of the SIRS criteria? No  MEWS guidelines implemented *See Row Information* Yes  Treat  MEWS Interventions Escalated (See documentation below)  Take Vital Signs  Increase Vital Sign Frequency  Yellow: Q 2hr X 2 then Q 4hr X 2, if remains yellow, continue Q 4hrs  Escalate  MEWS: Escalate Yellow: discuss with charge nurse/RN and consider discussing with provider and RRT  Assess: SIRS CRITERIA  SIRS Temperature  0  SIRS Pulse 1  SIRS Respirations  0  SIRS WBC 0  SIRS Score Sum  1

## 2021-10-04 NOTE — ED Notes (Signed)
ED TO INPATIENT HANDOFF REPORT  ED Nurse Name and Phone #    Angelina Pih RN  S Name/Age/Gender Mark Thomas 71 y.o. male Room/Bed: MH01/MH01  Code Status   Code Status: Prior  Home/SNF/Other Pt from home Oriented x4  Is this baseline? yes  Triage Complete: Triage complete  Chief Complaint Pneumonia [J18.9]  Triage Note Pt c/o dry cough x 1 week. Pt was diagnosed with pneumonia on Thursday.   Allergies Allergies  Allergen Reactions   Dilaudid [Hydromorphone] Other (See Comments)    Respiratory Arrest!!!!   Bactrim [Sulfamethoxazole-Trimethoprim] Rash   Benadryl [Diphenhydramine] Other (See Comments)    Muscle spasms   Phenobarbital Other (See Comments)    From childhood- reaction not recalled at this time   Penicillins Other (See Comments)    From childhood- reaction not recalled at this time    Level of Care/Admitting Diagnosis ED Disposition     ED Disposition  Admit   Condition  --   [place]: [place] [[id number]]  Level of Care: Telemetry [5]  Admit to tele based on following criteria: Monitor for Ischemic changes  May admit patient to [facility] if equivalent level of care is available:: Yes  Interfacility transfer: Yes  Covid Evaluation: Asymptomatic - no recent exposure (last 10 days) testing not required  Diagnosis: Pneumonia [227785]  Admitting Physician: Bonnell Public [[id number]]  Attending Physician: Dana Allan I [[id number]]  Estimated length of stay: 3 - 4 days  Certification:: I certify this patient will need inpatient services for at least 2 midnights          B Medical/Surgery History Past Medical History:  Diagnosis Date   Anxiety    Coronary artery disease    Depression    Essential tremor    GERD (gastroesophageal reflux disease)    High cholesterol    Hypertension    Myocardial infarct ([place])    Orthostatic hypotension    Stroke ([place])    Vascular dementia ([place])     Past Surgical History:  Procedure Laterality Date   [place]       A IV Location/Drains/Wounds Patient Lines/Drains/Airways Status     Active Line/Drains/Airways     Name Placement date Placement time Site Days   Peripheral IV 10/03/21 20 G 1.88" Anterior;Left;Upper Arm 10/03/21  1509  Arm  1            Intake/Output Last 24 hours  Intake/Output Summary (Last 24 hours) at 10/04/2021 1141 Last data filed at 10/04/2021 5329 Gross per 24 hour  Intake 1230.34 ml  Output 260 ml  Net 970.34 ml    Labs/Imaging Results for orders placed or performed during the hospital encounter of 10/03/21 (from the past 48 hour(s))  Culture, blood (Routine x 2)     Status: None (Preliminary result)   Collection Time: 10/03/21  3:00 PM   Specimen: BLOOD LEFT ARM  Result Value Ref Range   Specimen Description      BLOOD LEFT ARM BLOOD Performed at [facility], [address] [place], [place] [address]    Special Requests      Blood Culture adequate volume BOTTLES DRAWN AEROBIC AND ANAEROBIC Performed at [facility], [address] [place], [place] [address]  Culture      NO GROWTH < 24 HOURS Performed at [facility], [place] [address], [place], [place] [address]    Report Status PENDING   Comprehensive metabolic panel     Status: Abnormal   Collection Time: 10/03/21  3:05 PM  Result Value Ref Range   Sodium 135 135 - 145 mmol/L   Potassium 4.2 3.5 - 5.1 mmol/L   Chloride 102 98 - 111 mmol/L   CO2 25 22 - 32 mmol/L   Glucose, Bld 128 (H) 70 - 99 mg/dL    Comment: Glucose reference range applies only to samples taken after fasting for at least 8 hours.   BUN 12 8 - 23 mg/dL   Creatinine, Ser 1.01 0.61 - 1.24 mg/dL   Calcium 8.5 (L) 8.9 - 10.3 mg/dL   Total Protein 7.1 6.5 - 8.1 g/dL   Albumin 2.8 (L) 3.5 - 5.0 g/dL   AST 26 15 - 41 U/L    ALT 20 0 - 44 U/L   Alkaline Phosphatase 56 38 - 126 U/L   Total Bilirubin 1.0 0.3 - 1.2 mg/dL   GFR, Estimated >60 >60 mL/min    Comment: (NOTE) Calculated using the CKD-EPI Creatinine Equation (2021)    Anion gap 8 5 - 15    Comment: Performed at [facility], [address] [place], [place] [address]  Lactic acid, plasma     Status: None   Collection Time: 10/03/21  3:05 PM  Result Value Ref Range   Lactic Acid, Venous 1.3 0.5 - 1.9 mmol/L    Comment: Performed at [facility], [address] [place], [place] [address]  CBC with Differential     Status: Abnormal   Collection Time: 10/03/21  3:05 PM  Result Value Ref Range   WBC 13.3 (H) 4.0 - 10.5 K/uL   RBC 3.06 (L) 4.22 - 5.81 MIL/uL   Hemoglobin 10.4 (L) 13.0 - 17.0 g/dL   HCT 30.8 (L) 39.0 - 52.0 %   MCV 100.7 (H) 80.0 - 100.0 fL   MCH 34.0 26.0 - 34.0 pg   MCHC 33.8 30.0 - 36.0 g/dL   RDW 15.7 (H) 11.5 - 15.5 %   Platelets 209 150 - 400 K/uL   nRBC 0.0 0.0 - 0.2 %   Neutrophils Relative % 81 %   Neutro Abs 10.7 (H) 1.7 - 7.7 K/uL   Lymphocytes Relative 8 %   Lymphs Abs 1.1 0.7 - 4.0 K/uL   Monocytes Relative 10 %   Monocytes Absolute 1.3 (H) 0.1 - 1.0 K/uL   Eosinophils Relative 0 %   Eosinophils Absolute 0.0 0.0 - 0.5 K/uL   Basophils Relative 0 %   Basophils Absolute 0.0 0.0 - 0.1 K/uL   Immature Granulocytes 1 %   Abs Immature Granulocytes 0.09 (H) 0.00 - 0.07 K/uL    Comment: Performed at [facility], [address] [place], [place] [address]  Protime-INR     Status: None   Collection Time: 10/03/21  3:05 PM  Result Value Ref Range   Prothrombin Time 15.1 11.4 - 15.2 seconds   INR 1.2 0.8 - 1.2    Comment: (NOTE) INR goal varies based on device and disease states. Performed at [facility], [address] [place], [place] [address]   Resp Panel by RT-PCR (Flu A&B, Covid) Anterior Nasal Swab     Status: None   Collection Time:  10/03/21  3:05 PM   Specimen:  Anterior Nasal Swab  Result Value Ref Range   SARS Coronavirus 2 by RT PCR NEGATIVE NEGATIVE    Comment: (NOTE) SARS-CoV-2 target nucleic acids are NOT DETECTED.  The SARS-CoV-2 RNA is generally detectable in upper respiratory specimens during the acute phase of infection. The lowest concentration of SARS-CoV-2 viral copies this assay can detect is 138 copies/mL. A negative result does not preclude SARS-Cov-2 infection and should not be used as the sole basis for treatment or other patient management decisions. A negative result may occur with  improper specimen collection/handling, submission of specimen other than nasopharyngeal swab, presence of viral mutation(s) within the areas targeted by this assay, and inadequate number of viral copies(<138 copies/mL). A negative result must be combined with clinical observations, patient history, and epidemiological information. The expected result is Negative.  Fact Sheet for Patients:  EntrepreneurPulse.com.au  Fact Sheet for Healthcare Providers:  IncredibleEmployment.be  This test is no t yet approved or cleared by the [place] FDA and  has been authorized for detection and/or diagnosis of SARS-CoV-2 by FDA under an Emergency Use Authorization (EUA). This EUA will remain  in effect (meaning this test can be used) for the duration of the COVID-19 declaration under Section 564(b)(1) of the Act, 21 U.S.C.section 360bbb-3(b)(1), unless the authorization is terminated  or revoked sooner.       Influenza A by PCR NEGATIVE NEGATIVE   Influenza B by PCR NEGATIVE NEGATIVE    Comment: (NOTE) The Xpert Xpress SARS-CoV-2/FLU/RSV plus assay is intended as an aid in the diagnosis of influenza from Nasopharyngeal swab specimens and should not be used as a sole basis for treatment. Nasal washings and aspirates are unacceptable for Xpert Xpress SARS-CoV-2/FLU/RSV testing.  Fact Sheet for  Patients: EntrepreneurPulse.com.au  Fact Sheet for Healthcare Providers: IncredibleEmployment.be  This test is not yet approved or cleared by the [place] FDA and has been authorized for detection and/or diagnosis of SARS-CoV-2 by FDA under an Emergency Use Authorization (EUA). This EUA will remain in effect (meaning this test can be used) for the duration of the COVID-19 declaration under Section 564(b)(1) of the Act, 21 U.S.C. section 360bbb-3(b)(1), unless the authorization is terminated or revoked.  Performed at [facility], [address] [place], [place] [address]   Urinalysis, Routine w reflex microscopic Urine, Clean Catch     Status: None   Collection Time: 10/03/21  9:59 PM  Result Value Ref Range   Color, Urine YELLOW YELLOW   APPearance CLEAR CLEAR   Specific Gravity, Urine 1.010 1.005 - 1.030   pH 7.0 5.0 - 8.0   Glucose, UA NEGATIVE NEGATIVE mg/dL   Hgb urine dipstick NEGATIVE NEGATIVE   Bilirubin Urine NEGATIVE NEGATIVE   Ketones, ur NEGATIVE NEGATIVE mg/dL   Protein, ur NEGATIVE NEGATIVE mg/dL   Nitrite NEGATIVE NEGATIVE   Leukocytes,Ua NEGATIVE NEGATIVE    Comment: Microscopic not done on urines with negative protein, blood, leukocytes, nitrite, or glucose < 500 mg/dL. Performed at [facility], [address] [place], [place] [address]    DG Chest [place] 1 View  Result Date: 10/03/2021 CLINICAL DATA:  Dry cough for 1 week EXAM: PORTABLE CHEST 1 VIEW COMPARISON:  09/28/2021 FINDINGS: Left chest cardiac device with leads overlying the right atrium and ventricle. Increased heterogeneous opacities in the left lung, now involving the left upper lung. No pleural effusion or pneumothorax. Unchanged cardiac and mediastinal contours. No acute osseous abnormality.  IMPRESSION: Increased heterogeneous opacities in the left lung, now involving the left upper lung, concerning for worsening pneumonia. Electronically  Signed   By: Merilyn Baba M.D.   On: 10/03/2021 13:06    Pending Labs Unresulted Labs (From admission, onward)     Start     Ordered   10/03/21 1358  MRSA Next Gen by PCR, Nasal  (MRSA Screening)  Once,   URGENT       Comments: [facility] to [facility] if going to be admitted.    10/03/21 1358   10/03/21 1231  Culture, blood (Routine x 2)  BLOOD CULTURE X 2,   STAT      10/03/21 1231   Signed and Held  CBC  (enoxaparin (LOVENOX)    CrCl >/= 30 ml/min)  Once,   R       Comments: Baseline for enoxaparin therapy IF NOT ALREADY DRAWN.  Notify MD if PLT < 100 K.    Signed and Held   Signed and Held  Creatinine, serum  (enoxaparin (LOVENOX)    CrCl >/= 30 ml/min)  Once,   R       Comments: Baseline for enoxaparin therapy IF NOT ALREADY DRAWN.    Signed and Held   Signed and Held  Creatinine, serum  (enoxaparin (LOVENOX)    CrCl >/= 30 ml/min)  Weekly,   R     Comments: while on enoxaparin therapy    Signed and Held   Signed and Held  Basic metabolic panel  Tomorrow morning,   R        Signed and Held   Signed and Held  CBC  Tomorrow morning,   R        Signed and Held            Vitals/Pain Today's Vitals   10/04/21 0900 10/04/21 0915 10/04/21 1000 10/04/21 1100  BP:   130/76 136/72  Pulse:  (!) 55 (!) 33 (!) 106  Resp:  (!) 24 (!) 24 (!) 24  Temp: 99 F (37.2 C) 99 F (37.2 C)    TempSrc:      SpO2:  99% 93% 93%  Weight:      Height:      PainSc:        Isolation Precautions No active isolations  Medications Medications  lactated ringers infusion (0 mLs Intravenous Paused 10/03/21 2333)  ceFEPIme (MAXIPIME) 2 g in sodium chloride 0.9 % 100 mL IVPB (0 g Intravenous Stopped 10/04/21 1127)  vancomycin (VANCOREADY) IVPB 1500 mg/300 mL (1,500 mg Intravenous Not Given 10/04/21 0714)  0.9 %  sodium chloride infusion ( Intravenous New Bag/Given 10/04/21 0539)  amiodarone (NEXTERONE PREMIX) 360-4.14 MG/200ML-% (1.8 mg/mL) IV infusion (0 mg/hr Intravenous  Stopped 10/04/21 0522)  amiodarone (NEXTERONE PREMIX) 360-4.14 MG/200ML-% (1.8 mg/mL) IV infusion (30 mg/hr Intravenous New Bag/Given 10/04/21 0539)  QUEtiapine (SEROQUEL) tablet 100 mg (100 mg Oral Given 10/03/21 2321)  guaiFENesin (ROBITUSSIN) 100 MG/5ML liquid 5 mL (5 mLs Oral Given 10/04/21 0737)  vancomycin (VANCOCIN) 1000 MG powder (  Not Given 10/04/21 0716)  vancomycin (VANCOCIN) 500 MG powder (  Not Given 10/04/21 0716)  lidocaine (LIDODERM) 5 % 1 patch (1 patch Transdermal Patch Applied 10/04/21 1001)  sodium chloride 0.9 % bolus 1,000 mL (0 mLs Intravenous Stopped 10/03/21 1845)  azithromycin (ZITHROMAX) 500 mg in sodium chloride 0.9 % 250 mL IVPB (0 mg Intravenous Stopped 10/03/21 1710)  vancomycin (VANCOCIN) IVPB 1000 mg/200 mL premix (0  mg Intravenous Stopped 10/03/21 1846)    Followed by  vancomycin (VANCOCIN) IVPB 1000 mg/200 mL premix (0 mg Intravenous Stopped 10/03/21 1938)  acetaminophen (TYLENOL) tablet 650 mg (650 mg Oral Given 10/03/21 1714)  guaiFENesin (ROBITUSSIN) 100 MG/5ML liquid 5 mL (5 mLs Oral Given 10/03/21 1715)  amiodarone (NEXTERONE) IV bolus only 150 mg/100 mL (0 mg Intravenous Stopped 10/03/21 2305)  guaiFENesin (ROBITUSSIN) 100 MG/5ML liquid 15 mL (15 mLs Oral Given 10/03/21 2244)  hydrOXYzine (ATARAX) tablet 25 mg (25 mg Oral Given 10/04/21 0226)  Vancomycin (VANCOCIN) 1,500 mg in sodium chloride 0.9 % 500 mL IVPB (0 mg Intravenous Stopped 10/04/21 0926)  acetaminophen (TYLENOL) tablet 1,000 mg (1,000 mg Oral Given 10/04/21 0805)  oxyCODONE-acetaminophen (PERCOCET/ROXICET) 5-325 MG per tablet 0.5 tablet (0.5 tablets Oral Given 10/04/21 1000)    Mobility Up with walker High fall risk   Focused Assessments O2 at 2 liters   R Recommendations: See Admitting Provider Note  Report given to:   Additional Notes:   Pt is continent  Pt was a very difficult stick, only has one IV. He has been very uncomfortable in our bed  He has a lidoderm patch and given percocot without relief. He has O2  started here. He takes it off sats only down to 93% last temp check 99.  Given vanc and cefepime this morning. Amiodarone at 30. Rate still bouncing around 110's Moving frequently in the bed. Tachypneic with all his moving

## 2021-10-04 NOTE — ED Notes (Signed)
Report given to [facility]

## 2021-10-04 NOTE — Assessment & Plan Note (Signed)
Secondary to pneumonia and admitted on 2L  -Has had recurrent pneumonia treated with multiple rounds of antibiotic and steroid since COVID in December/2022.  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 -Continue IV Vancomycin and IV cefepime  -He was doing well off O2 on my evaluation so will trial weaning off. If he does worse, would consider at least curbsiding pulmonology while here.

## 2021-10-04 NOTE — Assessment & Plan Note (Signed)
Continue midodrine and fludrocortisone

## 2021-10-04 NOTE — ED Notes (Signed)
Report sent to Floor nurse. [facility] in to transport

## 2021-10-04 NOTE — Assessment & Plan Note (Signed)
Notified shortly after admission that pt was having hemoptysis with small clots. Suspect this is from his severe cough. Okay to continue antiplatelet for now and continue to monitor closely.

## 2021-10-04 NOTE — H&P (Addendum)
History and Physical    Patient: Mark Thomas DGL:[id number] DOB: April 09, 1951 DOA: 10/03/2021 DOS: the patient was seen and examined on 10/04/2021 PCP: Luetta Nutting, DO  Patient coming from: Outside Hospital [facility]  Chief Complaint:  Chief Complaint  Patient presents with   Cough   HPI: Mark Thomas is a 71 y.o. male with medical history significant of orthostatic hypotension on midodrine and fludrocortisone, CAD s/p stent, CVA, CKD 3 hypertension, paroxysmal atrial fibrillation s/p Watchman device not on anticoagulation who presented to outside ED with worsening shortness of breath and cough.  Symptoms have been ongoing for close to 2 weeks. He was started on Azithromycin by PCP about 5 days ago but symptoms continued to worsening. Has burning chest pain from severe cough. Feels abdominal distension which is normal his symptom for CHF exacerbation. No LE edema.   He has been having progressive shortness of breath and cough ever since COVID infection in 04/2021.  Has been following with pulmonology outpatient and has been on multiple rounds of antibiotics and prednisone with some improvement in cough symptoms.  Return once courses have been completed. Pulmonology suspect that his symptoms are multi factorial with ongoing GERD/reflux disease mild ILD pattern seen on chest x-ray.  He had modified barium swallow in April that did not show any frank aspiration.  In the ED, he was hypoxic requiring 2L via [place]. Has leukocytosis of 13.3 K, hemoglobin of 10.4 down from 12.4.  CMP unremarkable.  Chest x-ray shows increase opacity in the left lung not involving the left upper lung concerning for worsening pneumonia.  He was started on IV vancomycin and cefepime and was requested to send to [facility] for [facility]. On arrival, patient was noted to amiodarone infusion due to atrial fibrillation with RVR that developed overnight.  His heart rate remains elevated up to 140 with exertion.  Wife is very  concerned and remembers that pulmonary mentioned bronchoscopy at one point and is inquiring about that. Reports he has not had atrial fibrillation issues for several years until now.    Review of Systems: As mentioned in the history of present illness. All other systems reviewed and are negative. Past Medical History:  Diagnosis Date   Anxiety    Coronary artery disease    Depression    Essential tremor    GERD (gastroesophageal reflux disease)    High cholesterol    Hypertension    Myocardial infarct (HCC)    Orthostatic hypotension    Stroke ([place])    Vascular dementia ([place])    Past Surgical History:  Procedure Laterality Date   APPENDECTOMY     CARDIAC SURGERY     CHOLECYSTECTOMY     HERNIA REPAIR     NASAL SINUS SURGERY     SHOULDER SURGERY     Social History:  reports that he has never smoked. He has never been exposed to tobacco smoke. He has never used smokeless tobacco. He reports that he does not currently use alcohol. He reports that he does not use drugs.  Allergies  Allergen Reactions   Dilaudid [Hydromorphone] Other (See Comments)    Respiratory Arrest!!!!   Bactrim [Sulfamethoxazole-Trimethoprim] Rash   Benadryl [Diphenhydramine] Other (See Comments)    Muscle spasms   Phenobarbital Other (See Comments)    From childhood- reaction not recalled at this time   Penicillins Other (See Comments)    From childhood- reaction not recalled at this time    Family History  Problem Relation Age of  Onset   Hypertension Mother    Stroke Mother    Stroke Father    Hypertension Father     Prior to Admission medications   Medication Sig Start Date End Date Taking? Authorizing Provider  albuterol (PROVENTIL) (2.5 MG/3ML) 0.083% nebulizer solution Take 3 mLs (2.5 mg total) by nebulization every 6 (six) hours as needed for wheezing or shortness of breath. Patient taking differently: Take 2.5 mg by nebulization See admin instructions. Nebulize 2.5 mg and inhale into the  lungs every 4-6 hours as needed for wheezing or shortness of breath 07/15/21   Luetta Nutting, DO  albuterol (VENTOLIN HFA) 108 (90 Base) MCG/ACT inhaler Inhale 2 puffs into the lungs every 6 (six) hours as needed for wheezing or shortness of breath.    [provider]  allopurinol (ZYLOPRIM) 100 MG tablet Take 100 mg by mouth daily. 09/22/20   [provider]  aspirin 81 MG EC tablet Take 81 mg by mouth in the morning. 05/05/14   [provider]  atorvastatin (LIPITOR) 80 MG tablet Take 80 mg by mouth daily. 11/18/20   [provider]  azithromycin (ZITHROMAX) 250 MG tablet Take 2 tablets on day 1, then 1 tablet daily on days 2 through 5 09/30/21 10/05/21  Samuel Bouche, NP  b complex vitamins capsule Take 1 capsule by mouth daily.    [provider]  Cholecalciferol (VITAMIN D3) 10 MCG (400 UNIT) CAPS Take 400 Units by mouth in the morning.    [provider]  clopidogrel (PLAVIX) 75 MG tablet Take 75 mg by mouth daily. 11/18/20   [provider]  escitalopram (LEXAPRO) 20 MG tablet Take 1.5 tablets (30 mg total) by mouth daily. 09/28/21   Merian Capron, MD  ferrous sulfate 325 (65 FE) MG EC tablet Take 1 tablet (325 mg total) by mouth in the morning and at bedtime. 03/19/21 09/13/21  Luetta Nutting, DO  FLUDROCORTISONE ACETATE PO Take 0.2 mg by mouth in the morning and at bedtime.    [provider]  latanoprost (XALATAN) 0.005 % ophthalmic solution Place 1 drop into both eyes at bedtime. 12/07/20   [provider]  magnesium oxide (MAG-OX) 400 MG tablet Take 400 mg by mouth daily.    [provider]  memantine (NAMENDA) 10 MG tablet TAKE 1 TABLET TWICE A DAY 09/21/21   Alric Ran, MD  midodrine (PROAMATINE) 5 MG tablet Take 1 tablet (5 mg total) by mouth 3 (three) times daily with meals. 09/24/21   Park Liter, MD  mometasone-formoterol (DULERA) 200-5 MCG/ACT AERO Inhale 2 puffs into the lungs 2 (two) times  daily. 09/14/21   Freddi Starr, MD  nitroGLYCERIN (NITROLINGUAL) 0.4 MG/SPRAY spray Place 1 spray under the tongue every 5 (five) minutes x 3 doses as needed for chest pain. 05/05/14   [provider]  pantoprazole (PROTONIX) 40 MG tablet Take 1 tablet (40 mg total) by mouth every evening. 07/07/21   Luetta Nutting, DO  potassium citrate (UROCIT-K) 10 MEQ (1080 MG) SR tablet Take 10 mEq by mouth daily. 09/22/20   [provider]  primidone (MYSOLINE) 50 MG tablet Take 2 tablets (100 mg total) by mouth 2 (two) times daily. 08/24/21 11/22/21  Alric Ran, MD  promethazine-dextromethorphan (PROMETHAZINE-DM) 6.25-15 MG/5ML syrup Take 5 mLs by mouth 4 (four) times daily as needed for cough. 09/28/21   Samuel Bouche, NP  QUEtiapine (SEROQUEL) 50 MG tablet Take 2 tablets (100 mg total) by mouth at bedtime. 09/28/21 12/27/21  Merian Capron, MD  ranolazine (RANEXA) 1000 MG SR tablet Take 1,000 mg by mouth in the morning and at bedtime.    [provider]  rOPINIRole (REQUIP XL) 4 MG 24 hr tablet Take 1 tablet (4 mg total) by mouth at bedtime. 09/23/21 09/18/22  Alric Ran, MD  triamcinolone ointment (KENALOG) 0.1 % Apply 1 application. topically as needed (to affected areas- for psoriasis flares). 01/21/21   [provider]  FLUoxetine (PROZAC) 20 MG capsule Take 1 capsule (20 mg total) by mouth daily. 09/21/21 09/28/21  Merian Capron, MD    Physical Exam: Vitals:   10/04/21 1553 10/04/21 1600 10/04/21 1648 10/04/21 1721  BP: (!) 158/96 (!) 171/88 (!) 170/99   Pulse: (!) 127 (!) 128 (!) 122 (!) 134  Resp: (!) _0 Temp: 99.4 F (37.4 C)     TempSrc: Oral     SpO2: 95% 95% 96% 94%  Weight:      Height:       Constitutional: NAD, calm, mildly ill-appearing obese gentleman laying flat in bed with frequent severe dry cough Eyes: lids and conjunctivae normal ENMT: Mucous membranes are moist.  Neck: normal, supple,  Respiratory: clear to auscultation bilaterally,  no wheezing, no crackles. Normal respiratory effort on room air. No accessory muscle use.  Cardiovascular: Irregularly irregular rate and rhythm, no murmurs / rubs / gallops. No extremity edema.  Abdomen: Soft, distended abdomen, no tenderness, Bowel sounds positive.  Musculoskeletal: no clubbing / cyanosis. No joint deformity upper and lower extremities. Good ROM, no contractures. Normal muscle tone.  Skin: no rashes, lesions, ulcers.  Neurologic: CN 2-12 grossly intact. Strength 5/5 in all 4.  Psychiatric: Normal judgment and insight. Alert and oriented x 3. Normal mood. Data Reviewed:  See HPI  Assessment and Plan: * Acute respiratory failure with hypoxia ([place]) Secondary to pneumonia and admitted on 2L  -Has had recurrent pneumonia treated with multiple rounds of antibiotic and steroid since COVID in December/2022.  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 -Continue IV Vancomycin and IV cefepime  -He was doing well off O2 on my evaluation so will trial weaning off. If he does worse, would consider at least curbsiding pulmonology while here.    Paroxysmal atrial fibrillation with RVR ([place]) -History of ablation in 2018 and 2021 -s/p watchman device -pt was transferred over from [facility] on amiodarone overnight but HR remains elevated up to 140 with exertion -cardiology consulted since he is unlikely to tolerate beta blocker or CCB with his orthostatic hypotension. He also has mild ILD which makes him high risk for side effects with amiodarone. Appreciate recommendations.  Orthostatic hypotension Continue midodrine and fludrocortisone  CAD (coronary artery disease) S/p stents Continue aspirin, plavix and atorvastatin  Hemoptysis Notified shortly after admission that pt was having hemoptysis with small clots. Suspect this is from his severe cough. Okay to continue antiplatelet for now and continue to monitor closely.   History of CVA (cerebrovascular  accident) Continue aspirin  Pacemaker Stable.      Advance Care Planning:   Code Status: Full Code   Consults: cardiology  Family Communication: Discussed with wife at bedside  Severity of Illness: The appropriate patient status for this patient is INPATIENT. Inpatient status is judged to be reasonable and necessary in order to provide the required intensity of service to ensure the patient's safety. The patient's presenting symptoms, physical exam findings, and initial radiographic and laboratory data in the context of  their chronic comorbidities is felt to place them at high risk for further clinical deterioration. Furthermore, it is not anticipated that the patient will be medically stable for discharge from the hospital within 2 midnights of admission.   * I certify that at the point of admission it is my clinical judgment that the patient will require inpatient hospital care spanning beyond 2 midnights from the point of admission due to high intensity of service, high risk for further deterioration and high frequency of surveillance required.*  Author: Orene Desanctis, DO 10/04/2021 7:22 PM  For on call review [email or web address].

## 2021-10-04 NOTE — Assessment & Plan Note (Addendum)
S/p stents Continue aspirin, plavix and atorvastatin

## 2021-10-04 NOTE — Progress Notes (Signed)
   10/04/21 1413  Assess: if the MEWS score is Yellow or Red  Were vital signs taken at a resting state? Yes  Focused Assessment No change from prior assessment (at [facility] med [facility])  Does the patient meet 2 or more of the SIRS criteria? No  MEWS guidelines implemented *See Row Information* No, previously yellow, continue vital signs every 4 hours  Treat  Complains of Coughing  Interventions  (made MD aware of need for other cough med, as pt states robutussin not helping)  Notify: Charge Nurse/RN  Name of Charge Nurse/RN Notified Erin, RN CN (already aware of pt in afib/RVR and on amio drip prior to arrival)  Date Charge Nurse/RN Notified 10/04/21  Time Charge Nurse/RN Notified 1338 (and before arrival)  Notify: Provider  Provider Name/Title Dr Flossie Buffy  Date Provider Notified 10/04/21  Time Provider Notified 1414  Method of Notification Call (telephone call)  Notification Reason Other (Comment) (notified of pt arrival to [facility] from [place] [facility], she is the  admit doc, and already aware of Afib and on amiodarone drip)  Provider response Other (Comment) (already aware of afib and amio drip)  Date of Provider Response 10/04/21  Time of Provider Response 1414

## 2021-10-05 DIAGNOSIS — I471 Supraventricular tachycardia: Secondary | ICD-10-CM

## 2021-10-05 DIAGNOSIS — J9601 Acute respiratory failure with hypoxia: Secondary | ICD-10-CM | POA: Diagnosis not present

## 2021-10-05 DIAGNOSIS — I480 Paroxysmal atrial fibrillation: Secondary | ICD-10-CM | POA: Diagnosis not present

## 2021-10-05 DIAGNOSIS — J189 Pneumonia, unspecified organism: Secondary | ICD-10-CM | POA: Diagnosis not present

## 2021-10-05 LAB — CBC
HCT: 31.1 % — ABNORMAL LOW (ref 39.0–52.0)
Hemoglobin: 10.2 g/dL — ABNORMAL LOW (ref 13.0–17.0)
MCH: 33.4 pg (ref 26.0–34.0)
MCHC: 32.8 g/dL (ref 30.0–36.0)
MCV: 102 fL — ABNORMAL HIGH (ref 80.0–100.0)
Platelets: 190 10*3/uL (ref 150–400)
RBC: 3.05 MIL/uL — ABNORMAL LOW (ref 4.22–5.81)
RDW: 15.3 % (ref 11.5–15.5)
WBC: 11.8 10*3/uL — ABNORMAL HIGH (ref 4.0–10.5)
nRBC: 0 % (ref 0.0–0.2)

## 2021-10-05 LAB — BASIC METABOLIC PANEL
Anion gap: 8 (ref 5–15)
BUN: 12 mg/dL (ref 8–23)
CO2: 21 mmol/L — ABNORMAL LOW (ref 22–32)
Calcium: 8.5 mg/dL — ABNORMAL LOW (ref 8.9–10.3)
Chloride: 108 mmol/L (ref 98–111)
Creatinine, Ser: 0.82 mg/dL (ref 0.61–1.24)
GFR, Estimated: >60 mL/min (ref >60)
Glucose, Bld: 182 mg/dL — ABNORMAL HIGH (ref 70–99)
Potassium: 3.8 mmol/L (ref 3.5–5.1)
Sodium: 137 mmol/L (ref 135–145)

## 2021-10-05 LAB — MAGNESIUM: Magnesium: 1.9 mg/dL (ref 1.7–2.4)

## 2021-10-05 LAB — BRAIN NATRIURETIC PEPTIDE: B Natriuretic Peptide: 471.1 pg/mL — ABNORMAL HIGH (ref 0.0–100.0)

## 2021-10-05 MED ORDER — HYDROCORTISONE 1 % EX LOTN
TOPICAL_LOTION | Freq: Three times a day (TID) | CUTANEOUS | Status: DC
  Filled 2021-10-05: qty 118

## 2021-10-05 MED ORDER — BACITRACIN ZINC 500 UNIT/GM EX OINT
TOPICAL_OINTMENT | Freq: Two times a day (BID) | CUTANEOUS | Status: DC
  Administered 2021-10-08: 1 via TOPICAL
  Filled 2021-10-05 (×2): qty 28.4

## 2021-10-05 MED ORDER — HYDROCORTISONE 1 % EX CREA
TOPICAL_CREAM | Freq: Three times a day (TID) | CUTANEOUS | Status: DC
  Administered 2021-10-08: 1 via TOPICAL
  Filled 2021-10-05 (×3): qty 28

## 2021-10-05 MED ORDER — HYDROCODONE-ACETAMINOPHEN 5-325 MG PO TABS
1.0000 | ORAL_TABLET | ORAL | Status: DC | PRN
  Administered 2021-10-05 – 2021-10-07 (×6): 1 via ORAL
  Filled 2021-10-05 (×6): qty 1

## 2021-10-05 MED ORDER — VANCOMYCIN HCL 1250 MG/250ML IV SOLN
1250.0000 mg | Freq: Two times a day (BID) | INTRAVENOUS | Status: DC
Start: 2021-10-05 — End: 2021-10-07
  Administered 2021-10-05 – 2021-10-07 (×4): 1250 mg via INTRAVENOUS
  Filled 2021-10-05 (×4): qty 250

## 2021-10-05 MED ORDER — POTASSIUM CHLORIDE CRYS ER 20 MEQ PO TBCR
40.0000 meq | EXTENDED_RELEASE_TABLET | Freq: Once | ORAL | Status: AC
  Administered 2021-10-05: 40 meq via ORAL
  Filled 2021-10-05: qty 2

## 2021-10-05 MED ORDER — OXYCODONE HCL 5 MG PO TABS
5.0000 mg | ORAL_TABLET | Freq: Once | ORAL | Status: AC | PRN
  Administered 2021-10-05: 5 mg via ORAL
  Filled 2021-10-05: qty 1

## 2021-10-05 NOTE — Progress Notes (Signed)
Progress Note  Patient Name: Mark Thomas Date of Encounter: 10/05/2021  [facility] Cardiologist: Jenne Campus, MD   Subjective   Coughing.  Feels better this morning.  No recurrence of hemoptysis.  Inpatient Medications    Scheduled Meds:  allopurinol  100 mg Oral Daily   aspirin EC  81 mg Oral q AM   atorvastatin  80 mg Oral Daily   bacitracin   Topical BID   chlorpheniramine-HYDROcodone  5 mL Oral Q12H   cholecalciferol  400 Units Oral Daily   clopidogrel  75 mg Oral Daily   enoxaparin (LOVENOX) injection  40 mg Subcutaneous Q24H   escitalopram  30 mg Oral Daily   fludrocortisone  0.1 mg Oral BID   hydrocortisone cream   Topical TID   latanoprost  1 drop Both Eyes QHS   lidocaine  1 patch Transdermal Q24H   magnesium oxide  400 mg Oral Daily   memantine  10 mg Oral BID   methylPREDNISolone (SOLU-MEDROL) injection  60 mg Intravenous Daily   metoprolol tartrate  12.5 mg Oral BID   midodrine  5 mg Oral TID WC   mometasone-formoterol  2 puff Inhalation BID   ondansetron (ZOFRAN) IV  4 mg Intravenous Once   pantoprazole  40 mg Oral QPM   potassium citrate  10 mEq Oral Daily   primidone  100 mg Oral BID   QUEtiapine  100 mg Oral QHS   ranolazine  1,000 mg Oral BID   rOPINIRole  4 mg Oral QHS   Continuous Infusions:  sodium chloride 10 mL/hr at 10/04/21 0539   amiodarone 30 mg/hr (10/05/21 0817)   ceFEPime (MAXIPIME) IV 2 g (10/05/21 0512)   vancomycin     PRN Meds: sodium chloride, acetaminophen, guaiFENesin, ipratropium-albuterol   Vital Signs    Vitals:   10/04/21 2338 10/05/21 0303 10/05/21 0747 10/05/21 0825  BP: 119/67 99/77  (!) 141/91  Pulse: (!) 111 (!) 101  (!) 103  Resp: (!) 21 20    Temp: 98.5 F (36.9 C) 98.8 F (37.1 C)  97.7 F (36.5 C)  TempSrc: Oral Oral  Oral  SpO2: 91% 97% 96%   Weight:      Height:        Intake/Output Summary (Last 24 hours) at 10/05/2021 1104 Last data filed at 10/05/2021 0944 Gross per 24 hour  Intake  --  Output 1450 ml  Net -1450 ml      10/03/2021   12:29 PM 09/28/2021    2:31 PM 09/14/2021    1:26 PM  Last 3 Weights  Weight (lbs) 260 lb 260 lb 263 lb  Weight (kg) 117.935 kg 117.935 kg 119.296 kg      Telemetry    Sinus rhythm with occasional PVCs.- Personally Reviewed  ECG    A new tracing is not been performed - Personally Reviewed  Physical Exam  Lying on his right side.  Not coughing.  In no distress. GEN: No acute distress.   Neck: No JVD Cardiac: RRR, no murmurs, rubs, or gallops.  Respiratory: Clear to auscultation bilaterally. GI: Soft, nontender, non-distended  MS: No edema; No deformity. Neuro:  Nonfocal  Psych: Normal affect   Labs    High Sensitivity Troponin:  No results for input(s): TROPONINIHS in the last 720 hours.   Chemistry Recent Labs  Lab 10/03/21 1505 10/04/21 1436 10/05/21 0805  NA 135 138 137  K 4.2 3.2* 3.8  CL 102 106 108  CO2 25 23  21*  GLUCOSE 128* 113* 182*  BUN _0 CREATININE 1.01 0.99 0.82  CALCIUM 8.5* 8.3* 8.5*  MG  --   --  1.9  PROT 7.1  --   --   ALBUMIN 2.8*  --   --   AST 26  --   --   ALT 20  --   --   ALKPHOS 56  --   --   BILITOT 1.0  --   --   GFRNONAA >60 >60 >60  ANIONGAP _1 Lipids No results for input(s): CHOL, TRIG, HDL, LABVLDL, LDLCALC, CHOLHDL in the last 168 hours.  Hematology Recent Labs  Lab 10/03/21 1505 10/04/21 1436 10/05/21 0805  WBC 13.3* 10.6* 11.8*  RBC 3.06* 2.84* 3.05*  HGB 10.4* 9.4* 10.2*  HCT 30.8* 29.1* 31.1*  MCV 100.7* 102.5* 102.0*  MCH 34.0 33.1 33.4  MCHC 33.8 32.3 32.8  RDW 15.7* 15.2 15.3  PLT 209 188 190   Thyroid No results for input(s): TSH, FREET4 in the last 168 hours.  BNP Recent Labs  Lab 10/04/21 1438  BNP 471.1*    DDimer No results for input(s): DDIMER in the last 168 hours.   Radiology    DG Chest Port 1 View  Result Date: 10/03/2021 CLINICAL DATA:  Dry cough for 1 week EXAM: PORTABLE CHEST 1 VIEW COMPARISON:  09/28/2021 FINDINGS:  Left chest cardiac device with leads overlying the right atrium and ventricle. Increased heterogeneous opacities in the left lung, now involving the left upper lung. No pleural effusion or pneumothorax. Unchanged cardiac and mediastinal contours. No acute osseous abnormality. IMPRESSION: Increased heterogeneous opacities in the left lung, now involving the left upper lung, concerning for worsening pneumonia. Electronically Signed   By: Merilyn Baba M.D.   On: 10/03/2021 13:06    Cardiac Studies   No new cardiac studies.  Known EF of 70 to 75% (hyperdynamic) 08/27/2021.  Patient Profile     71 y.o. male  with a hx of CAD and PTCA to LAD, ASD closure,  hx of DDD PPM 2018, hx of watchman 03/2019, RFA with isolation of all 4 pulmonary veins, 2021, successful closure of ASD 01/22/20, hx CVA, HTN, mod AS, CAD with hx MI, hypotension on midodrine who is being seen 10/04/2021 for the evaluation of atrial fib at the request of Dr. Tu--> not confirmed and rhythm felt to be sinus tachycardia with ectopic atrial tachycardia runs.  Assessment & Plan    Supraventricular tachycardia related to fever and systemic illness.  No atrial fibrillation identified.  Continue low-dose beta-blocker.  Discontinue amiodarone. Amiodarone therapy: Would recommend discontinuation.   [facility] will sign off.   Medication Recommendations: Low-dose beta-blocker, treat underlying systemic illness, discontinue amiodarone. Other recommendations (labs, testing, etc): None at this time Follow up as an outpatient: Has already established with Dr. Agustin Cree.  For questions or updates, please contact [place] Please consult [email or web address] for contact info under        Signed, Sinclair Grooms, MD  10/05/2021, 11:04 AM

## 2021-10-05 NOTE — Progress Notes (Signed)
Pt is resting comfortable does not appear to be in any pain or distress. Pt is c/o chest pain 6/10. Pt states " its my shingle site that hurts" RN offered PRN tylenol, Pt became frustrated and upset, pt states " tylenol does not help, that is not what has been given, that's not what the doctor ordered for my pain, I have been given oxycodone". RN informed the patient that the provider only order tylenol for pain, the oxycodone was a one time order. Pt refused the tylenol and state " I will wait for the provider to put in an order for oxycodone, I am not being taken care of here correctly". Provider Dennard Nip MD notified.

## 2021-10-05 NOTE — Progress Notes (Signed)
PROGRESS NOTE    Mark Thomas  FKC:[id number] DOB: October 18, 1950 DOA: 10/03/2021 PCP: Luetta Nutting, DO    Chief Complaint  Patient presents with   Cough    Brief Narrative:   Mark Thomas is a 71 y.o. male with medical history significant of orthostatic hypotension on midodrine and fludrocortisone, CAD s/p stent, CVA, CKD 3 hypertension, paroxysmal atrial fibrillation s/p Watchman device not on anticoagulation who presented to outside ED with worsening shortness of breath and cough. -Patient with multiple admissions recently secondary to pneumonia, chest x-ray showing worsening pneumonia so he was admitted for further treatment of that, as well he was noted to have irregular heart rhythm, tachycardic for which he was started on amiodarone drip, and seen by cardiology.  Assessment & Plan:   Principal Problem:   Acute respiratory failure with hypoxia (HCC) Active Problems:   Paroxysmal atrial fibrillation with RVR (HCC)   Orthostatic hypotension   CAD (coronary artery disease)   Pacemaker   History of CVA (cerebrovascular accident)   Hemoptysis   Acute respiratory failure with hypoxia (HCC) Multifocal pneumonia Questionable history of interstitial lung disease -Patient remains on 2 L nasal cannula -He was encouraged to use incentive spirometry and flutter valve. -Hold on steroids per PCCM recommendation. -Continue with antibiotics for now. -Plan for bronchoscopy in a.m. -As well concern of GERD contributing to worsening imaging, continue with PPI -will consult SLP to evaluate for dysphagia as well as he did have coughing episodes while eating lunch and choked on small piece of chicken.    Supraventricular tachycardia -Patient presents with elevated heart rate, initially thought to be A-fib with RVR, cardiology input greatly appreciated, appears to be supraventricular tachycardia, this is related to fever, systemic illness, no A-fib identified,. -Discontinue amiodarone  per cardiology recommendation. -Continue low-dose BB   Orthostatic hypotension - midodrine and fludrocortisone   CAD (coronary artery disease) S/p stents Continue aspirin, plavix and atorvastatin   Hemoptysis -No recurrence.   History of CVA (cerebrovascular accident) Continue aspirin   Pacemaker Stable.   Patient with left forehead rash, no vesicles or fluid collections, does not appear to be related to shingle, will start on bacitracin and cortisone ointment(he just recovered with shingles on the left back, but no vesicles are noted, and or lesions have been crusted off and tried)    DVT prophylaxis: Lovenox Code Status: FulL Family Communication: none at bedside Disposition:   Status is: Inpatient    Consultants:  PCCM Cardiology   Subjective:  Reports cough, no fever, no chills, no further hemoptysis.  Objective: Vitals:   10/05/21 0825 10/05/21 1120 10/05/21 1400 10/05/21 1605  BP: (!) 141/91 (!) 150/100 (!) 142/85 (!) 142/87  Pulse: (!) 103 93 (!) 101 95  Resp:  (!) 25  18  Temp: 97.7 F (36.5 C) 98 F (36.7 C)  97.7 F (36.5 C)  TempSrc: Oral Oral  Oral  SpO2:  93% 93% 96%  Weight:      Height:        Intake/Output Summary (Last 24 hours) at 10/05/2021 1638 Last data filed at 10/05/2021 1500 Gross per 24 hour  Intake 313 ml  Output 1200 ml  Net -887 ml   Filed Weights   10/03/21 1229  Weight: 117.9 kg    Examination:  Awake Alert, Oriented X 3, No new F.N deficits, Normal affect Symmetrical Chest wall movement, Good air movement bilaterally, rhonchi RRR,No Gallops,Rubs or new Murmurs, No Parasternal Heave +ve B.Sounds, Abd Soft, No tenderness, No  rebound - guarding or rigidity. No Cyanosis, Clubbing or edema, No new Rash or bruise       Data Reviewed: I have personally reviewed following labs and imaging studies  CBC: Recent Labs  Lab 10/03/21 1505 10/04/21 1436 10/05/21 0805  WBC 13.3* 10.6* 11.8*  NEUTROABS 10.7*  --   --    HGB 10.4* 9.4* 10.2*  HCT 30.8* 29.1* 31.1*  MCV 100.7* 102.5* 102.0*  PLT 209 188 370    Basic Metabolic Panel: Recent Labs  Lab 10/03/21 1505 10/04/21 1436 10/05/21 0805  NA 135 138 137  K 4.2 3.2* 3.8  CL 102 106 108  CO2 25 23 21*  GLUCOSE 128* 113* 182*  BUN _0 CREATININE 1.01 0.99 0.82  CALCIUM 8.5* 8.3* 8.5*  MG  --   --  1.9    GFR: Estimated Creatinine Clearance: 116 mL/min (by C-G formula based on SCr of 0.82 mg/dL).  Liver Function Tests: Recent Labs  Lab 10/03/21 1505  AST 26  ALT 20  ALKPHOS 56  BILITOT 1.0  PROT 7.1  ALBUMIN 2.8*    CBG: No results for input(s): GLUCAP in the last 168 hours.   Recent Results (from the past 240 hour(s))  Culture, blood (Routine x 2)     Status: None (Preliminary result)   Collection Time: 10/03/21  3:00 PM   Specimen: BLOOD LEFT ARM  Result Value Ref Range Status   Specimen Description   Final    BLOOD LEFT ARM BLOOD Performed at [facility], [address] [place], [place] [address]    Special Requests   Final    Blood Culture adequate volume BOTTLES DRAWN AEROBIC AND ANAEROBIC Performed at [facility], [address] [place], [place] [address]    Culture   Final    NO GROWTH 2 DAYS Performed at [facility], [place] [address], [place], [place] [address]    Report Status PENDING  Incomplete  Resp Panel by RT-PCR (Flu A&B, Covid) Anterior Nasal Swab     Status: None   Collection Time: 10/03/21  3:05 PM   Specimen: Anterior Nasal Swab  Result Value Ref Range Status   SARS Coronavirus 2 by RT PCR NEGATIVE NEGATIVE Final    Comment: (NOTE) SARS-CoV-2 target nucleic acids are NOT DETECTED.  The SARS-CoV-2 RNA is generally detectable in upper respiratory specimens during the acute phase of infection. The lowest concentration of SARS-CoV-2 viral copies this assay can detect is 138 copies/mL. A negative result does not preclude SARS-Cov-2 infection and should not be  used as the sole basis for treatment or other patient management decisions. A negative result may occur with  improper specimen collection/handling, submission of specimen other than nasopharyngeal swab, presence of viral mutation(s) within the areas targeted by this assay, and inadequate number of viral copies(<138 copies/mL). A negative result must be combined with clinical observations, patient history, and epidemiological information. The expected result is Negative.  Fact Sheet for Patients:  EntrepreneurPulse.com.au  Fact Sheet for Healthcare Providers:  IncredibleEmployment.be  This test is no t yet approved or cleared by the [place] FDA and  has been authorized for detection and/or diagnosis of SARS-CoV-2 by FDA under an Emergency Use Authorization (EUA). This EUA will remain  in effect (meaning this test can be used) for the duration of the COVID-19 declaration under Section 564(b)(1) of the Act, 21 U.S.C.section 360bbb-3(b)(1), unless the authorization is terminated  or revoked  sooner.       Influenza A by PCR NEGATIVE NEGATIVE Final   Influenza B by PCR NEGATIVE NEGATIVE Final    Comment: (NOTE) The Xpert Xpress SARS-CoV-2/FLU/RSV plus assay is intended as an aid in the diagnosis of influenza from Nasopharyngeal swab specimens and should not be used as a sole basis for treatment. Nasal washings and aspirates are unacceptable for Xpert Xpress SARS-CoV-2/FLU/RSV testing.  Fact Sheet for Patients: EntrepreneurPulse.com.au  Fact Sheet for Healthcare Providers: IncredibleEmployment.be  This test is not yet approved or cleared by the [place] FDA and has been authorized for detection and/or diagnosis of SARS-CoV-2 by FDA under an Emergency Use Authorization (EUA). This EUA will remain in effect (meaning this test can be used) for the duration of the COVID-19 declaration under Section  564(b)(1) of the Act, 21 U.S.C. section 360bbb-3(b)(1), unless the authorization is terminated or revoked.  Performed at [facility], [address] [place], [place] [address]   Culture, blood (Routine x 2)     Status: None (Preliminary result)   Collection Time: 10/04/21  2:37 PM   Specimen: BLOOD LEFT HAND  Result Value Ref Range Status   Specimen Description BLOOD LEFT HAND  Final   Special Requests   Final    BOTTLES DRAWN AEROBIC AND ANAEROBIC Blood Culture adequate volume   Culture   Final    NO GROWTH < 24 HOURS Performed at [facility], [place] [address], [place], [place] [address]    Report Status PENDING  Incomplete         Radiology Studies: No results found.      Scheduled Meds:  allopurinol  100 mg Oral Daily   aspirin EC  81 mg Oral q AM   atorvastatin  80 mg Oral Daily   bacitracin   Topical BID   chlorpheniramine-HYDROcodone  5 mL Oral Q12H   cholecalciferol  400 Units Oral Daily   clopidogrel  75 mg Oral Daily   enoxaparin (LOVENOX) injection  40 mg Subcutaneous Q24H   escitalopram  30 mg Oral Daily   fludrocortisone  0.1 mg Oral BID   hydrocortisone cream   Topical TID   latanoprost  1 drop Both Eyes QHS   lidocaine  1 patch Transdermal Q24H   magnesium oxide  400 mg Oral Daily   memantine  10 mg Oral BID   methylPREDNISolone (SOLU-MEDROL) injection  60 mg Intravenous Daily   metoprolol tartrate  12.5 mg Oral BID   midodrine  5 mg Oral TID WC   mometasone-formoterol  2 puff Inhalation BID   ondansetron (ZOFRAN) IV  4 mg Intravenous Once   pantoprazole  40 mg Oral QPM   potassium citrate  10 mEq Oral Daily   primidone  100 mg Oral BID   QUEtiapine  100 mg Oral QHS   ranolazine  1,000 mg Oral BID   rOPINIRole  4 mg Oral QHS   Continuous Infusions:  sodium chloride 10 mL/hr at 10/04/21 0539   amiodarone 30 mg/hr (10/05/21 0817)   ceFEPime (MAXIPIME) IV 2 g (10/05/21 1358)   vancomycin 1,250 mg (10/05/21 1635)     LOS:  1 day       Phillips Climes, MD [facility]   To contact the attending provider between 7A-7P or the covering provider during after hours 7P-7A, please log into the web site [email or web address] and access using universal [place] password for that web site. If you do not have the password,  please call the hospital operator.  10/05/2021, 4:38 PM

## 2021-10-05 NOTE — Progress Notes (Signed)
Pharmacy Antibiotic Note  Mark Thomas is a 71 y.o. male for which pharmacy has been consulted for vancomycin and cefepime dosing for pneumonia.  Patient with a history of anxiety, CAD, depression, essential tremor, GERD, HTN, MI, orthostatic hypotension, stroke, vascular dementia. Patient presenting with cough x 1 week. Diagnosed with PNA on Thursday prescribed z-pack.  SCr 0.99 WBC pending; LA pending; T 98.9 F; HR 103; RR 24  LA 1.3 on 10/03/21,  WBC  13.3 >10.6> 11.8  Tm 100.5,  afeb currently SCr 0.99 >0.82  Azith PO 6/1 >> 6/3, IV x1 in ED 6/4 Cefepime 6/4 >>  Vanc 6/4 >>  Micro: 6/5 BCx:  ngtd <24 hr 6/4 Resp PCR: neg 6/5 BCx:  ngtd x 2 days  Plan: Continue Cefepime 2g q8hr.  Will adjust Vancomycin  dose to 1250  mg q12hr (eAUC 497, using  Scr 0.82 and Vd 0.5 L/kg due to BMI >30)   Trend WBC, Fever, Renal function, & Clinical course and check vancomycin levels if needed per protocol.    Height: 6' 1" (185.4 cm) Weight: 117.9 kg (260 lb) IBW/kg (Calculated) : 79.9  Temp (24hrs), Avg:98.8 F (37.1 C), Min:97.7 F (36.5 C), Max:100.5 F (38.1 C)  Recent Labs  Lab 10/03/21 1505 10/04/21 1436 10/05/21 0805  WBC 13.3* 10.6* 11.8*  CREATININE 1.01 0.99 0.82  LATICACIDVEN 1.3  --   --      Estimated Creatinine Clearance: 116 mL/min (by C-G formula based on SCr of 0.82 mg/dL).    Allergies  Allergen Reactions   Dilaudid [Hydromorphone] Other (See Comments)    Respiratory Arrest!!!!   Bactrim [Sulfamethoxazole-Trimethoprim] Rash   Benadryl [Diphenhydramine] Other (See Comments)    Muscle spasms   Phenobarbital Other (See Comments)    From childhood- reaction not recalled at this time   Penicillins Other (See Comments)    From childhood- reaction not recalled at this time   Antimicrobials this admission: azith PO 6/1 >> 6/3 Azith IV x 1 in ED 6/4 Cefepime 6/4 >>  Vancomycin 6/4 >>  Microbiology results: 6/5 BCx:  ngtd <24 hr 6/4 Resp PCR: neg 6/5  BCx:  ngtd x 2 days  Thank you for allowing pharmacy to be a part of this patient's care.  Nicole Cella, RPh Clinical Pharmacist 10/05/2021 9:59 AM [phone] Please check AMION for all [place] phone numbers After 10:00 PM, call [place] [phone]

## 2021-10-05 NOTE — Consult Note (Signed)
NAME:  Mark Thomas, MRN:  [id number], DOB:  [birth year], LOS: 1 ADMISSION DATE:  10/03/2021, CONSULTATION DATE: 10/05/2021 REFERRING MD: [facility], CHIEF COMPLAINT: Refractory pneumonia  History of Present Illness:  71 year old male who is a never smoker with history of hypertension coronary disease with a stent along with CVA, gastroesophageal reflux disease, chronic kidney disease with current creatinine .90.  He has a history of atrial fibrillation followed by cardiology along with a permanent pacemaker and currently being paced.  He reports recently being treated for pneumonia with antibiotics with some improvement but signs and symptoms of infection have returned he noticed increasing orthopnea and generalized failure to thrive with episode of hemoptysis without fevers chills or sweats.  He did had COVID in 12/22 and has had problems with respiratory difficulty since.  He has been followed in the office by Dr. Erin Fulling and pulmonary critical care has been asked to evaluate while in the hospital due to his ongoing symptoms and been refractory to outpatient treatment. Pertinent  Medical History   Past Medical History:  Diagnosis Date   Anxiety    Coronary artery disease    Depression    Essential tremor    GERD (gastroesophageal reflux disease)    High cholesterol    Hypertension    Myocardial infarct (HCC)    Orthostatic hypotension    Stroke [facility])    Vascular dementia ([place])      Significant Hospital Events: Including procedures, antibiotic start and stop dates in addition to other pertinent events   10/05/2021 pulmonary consult  Interim History / Subjective:  71 year old with refractory pneumonia, interstitial lung disease, hemoptysis proving refractory to outpatient treatment  Objective   Blood pressure (!) 150/100, pulse 93, temperature 98 F (36.7 C), temperature source Oral, resp. rate (!) 25, height _0  (1.854 m), weight 117.9 kg, SpO2 93 %.        Intake/Output Summary  (Last 24 hours) at 10/05/2021 1152 Last data filed at 10/05/2021 0944 Gross per 24 hour  Intake --  Output 1450 ml  Net -1450 ml   Filed Weights   10/03/21 1229  Weight: 117.9 kg    Examination: General: Morbidly obese male no acute distress HENT: Oropharynx unremarkable, no JVD or lymphadenopathy is appreciated Lungs: Bibasilar wheezes Cardiovascular: Regular with a paced beat 75 Abdomen: Obese soft nontender Extremities: Without edema warm and dry Neuro: Grossly intact without defect GU: Voids  Resolved Hospital Problem list     Assessment & Plan:  Recurrent pneumonia with hypoxia, 2 episodes of hemoptysis, increasing chronic cough, history of COVID with beginnings of interstitial lung disease.  His pneumonia has improved refractory to multiple episodes of antibiotics, though he reports getting better while he is on antibiotics. Agree with antimicrobial therapy Questionable need for fiberoptic bronchoscopy for better cultures Okay to remain on anticoagulants at this time as hemoptysis has not worsened. Agree with steroids Bronchodilators Cough suppression Treatment of gastroesophageal reflux disease note he has a history of CVA  Cardiac disease with history of pacemaker insertion paroxysmal atrial fibrillation, history of MI Continue to monitor cardiac status  History of atrial fibrillation Currently being fully paced   Morbidly obese Weight loss  Chronic kidney disease Lab Results  Component Value Date   CREATININE 0.82 10/05/2021   CREATININE 0.99 10/04/2021   CREATININE 1.01 10/03/2021   CREATININE 0.99 09/28/2021   CREATININE 1.05 09/22/2021   CREATININE 1.02 09/08/2021  Currently well controlled    Best Practice (right click and "Reselect all SmartList  Selections" daily)   Diet/type: Regular consistency (see orders) DVT prophylaxis: LMWH GI prophylaxis: PPI Lines: N/A Foley:  N/A Code Status:  full code Last date of multidisciplinary goals of care  discussion [tbd]  Labs   CBC: Recent Labs  Lab 10/03/21 1505 10/04/21 1436 10/05/21 0805  WBC 13.3* 10.6* 11.8*  NEUTROABS 10.7*  --   --   HGB 10.4* 9.4* 10.2*  HCT 30.8* 29.1* 31.1*  MCV 100.7* 102.5* 102.0*  PLT 209 188 801    Basic Metabolic Panel: Recent Labs  Lab 10/03/21 1505 10/04/21 1436 10/05/21 0805  NA 135 138 137  K 4.2 3.2* 3.8  CL 102 106 108  CO2 25 23 21*  GLUCOSE 128* 113* 182*  BUN _0 CREATININE 1.01 0.99 0.82  CALCIUM 8.5* 8.3* 8.5*  MG  --   --  1.9   GFR: Estimated Creatinine Clearance: 116 mL/min (by C-G formula based on SCr of 0.82 mg/dL). Recent Labs  Lab 10/03/21 1505 10/04/21 1436 10/05/21 0805  WBC 13.3* 10.6* 11.8*  LATICACIDVEN 1.3  --   --     Liver Function Tests: Recent Labs  Lab 10/03/21 1505  AST 26  ALT 20  ALKPHOS 56  BILITOT 1.0  PROT 7.1  ALBUMIN 2.8*   No results for input(s): LIPASE, AMYLASE in the last 168 hours. No results for input(s): AMMONIA in the last 168 hours.  ABG    Component Value Date/Time   TCO2 29 08/04/2021 1535     Coagulation Profile: Recent Labs  Lab 10/03/21 1505  INR 1.2    Cardiac Enzymes: No results for input(s): CKTOTAL, CKMB, CKMBINDEX, TROPONINI in the last 168 hours.  HbA1C: Hgb A1c MFr Bld  Date/Time Value Ref Range Status  01/08/2021 01:38 AM 6.0 (H) 4.8 - 5.6 % Final    Comment:    (NOTE) Pre diabetes:          5.7%-6.4%  Diabetes:              >6.4%  Glycemic control for   <7.0% adults with diabetes     CBG: No results for input(s): GLUCAP in the last 168 hours.  Review of Systems:   10 point review of system taken, please see HPI for positives and negatives. Positive for orthopnea Positive for 2 episodes of hemoptysis scant Increasing malaise and depression No increase in GERD symptoms  Past Medical History:  He,  has a past medical history of Anxiety, Coronary artery disease, Depression, Essential tremor, GERD (gastroesophageal reflux  disease), High cholesterol, Hypertension, Myocardial infarct ([place]), Orthostatic hypotension, Stroke ([place]), and Vascular dementia ([place]).   Surgical History:   Past Surgical History:  Procedure Laterality Date   APPENDECTOMY     CARDIAC SURGERY     CHOLECYSTECTOMY     HERNIA REPAIR     NASAL SINUS SURGERY     SHOULDER SURGERY       Social History:   reports that he has never smoked. He has never been exposed to tobacco smoke. He has never used smokeless tobacco. He reports that he does not currently use alcohol. He reports that he does not use drugs.   Family History:  His family history includes Hypertension in his father and mother; Stroke in his father and mother.   Allergies Allergies  Allergen Reactions   Dilaudid [Hydromorphone] Other (See Comments)    Respiratory Arrest!!!!   Bactrim [Sulfamethoxazole-Trimethoprim] Rash   Benadryl [Diphenhydramine] Other (See Comments)    Muscle  spasms   Phenobarbital Other (See Comments)    From childhood- reaction not recalled at this time   Penicillins Other (See Comments)    From childhood- reaction not recalled at this time     Home Medications  Prior to Admission medications   Medication Sig Start Date End Date Taking? Authorizing Provider  albuterol (PROVENTIL) (2.5 MG/3ML) 0.083% nebulizer solution Take 3 mLs (2.5 mg total) by nebulization every 6 (six) hours as needed for wheezing or shortness of breath. Patient taking differently: Take 2.5 mg by nebulization every 4 (four) hours as needed for shortness of breath. 07/15/21  Yes Luetta Nutting, DO  albuterol (VENTOLIN HFA) 108 (90 Base) MCG/ACT inhaler Inhale 2 puffs into the lungs every 6 (six) hours as needed for wheezing or shortness of breath.   Yes [provider]  allopurinol (ZYLOPRIM) 100 MG tablet Take 100 mg by mouth daily. 09/22/20  Yes [provider]  aspirin 81 MG EC tablet Take 81 mg by mouth daily. 05/05/14  Yes [provider]   atorvastatin (LIPITOR) 80 MG tablet Take 80 mg by mouth daily. 11/18/20  Yes [provider]  azithromycin (ZITHROMAX) 250 MG tablet Take 2 tablets on day 1, then 1 tablet daily on days 2 through 5 Patient taking differently: Take 250 mg by mouth See admin instructions. Take 2 tablets by mouth on day 1, then take 1 tablet by mouth daily on days 2 through 5. 09/30/21 10/05/21 Yes Jessup, Joy, NP  b complex vitamins capsule Take 1 capsule by mouth daily.   Yes [provider]  Cholecalciferol (VITAMIN D3) 10 MCG (400 UNIT) CAPS Take 400 Units by mouth in the morning.   Yes [provider]  clopidogrel (PLAVIX) 75 MG tablet Take 75 mg by mouth daily. 11/18/20  Yes [provider]  escitalopram (LEXAPRO) 20 MG tablet Take 1.5 tablets (30 mg total) by mouth daily. 09/28/21  Yes Merian Capron, MD  FLUDROCORTISONE ACETATE PO Take 0.1 mg by mouth in the morning and at bedtime.   Yes [provider]  latanoprost (XALATAN) 0.005 % ophthalmic solution Place 1 drop into both eyes at bedtime. 12/07/20  Yes [provider]  magnesium oxide (MAG-OX) 400 MG tablet Take 400 mg by mouth daily.   Yes [provider]  memantine (NAMENDA) 10 MG tablet TAKE 1 TABLET TWICE A DAY Patient taking differently: Take 10 mg by mouth 2 (two) times daily. 09/21/21  Yes Camara, Maryan Puls, MD  midodrine (PROAMATINE) 5 MG tablet Take 1 tablet (5 mg total) by mouth 3 (three) times daily with meals. Patient taking differently: Take 5 mg by mouth in the morning, at noon, in the evening, and at bedtime. 09/24/21  Yes Park Liter, MD  nitroGLYCERIN (NITROLINGUAL) 0.4 MG/SPRAY spray Place 1 spray under the tongue every 5 (five) minutes x 3 doses as needed for chest pain. 05/05/14  Yes [provider]  pantoprazole (PROTONIX) 40 MG tablet Take 1 tablet (40 mg total) by mouth every evening. 07/07/21  Yes Luetta Nutting, DO  potassium citrate (UROCIT-K) 10 MEQ (1080 MG) SR tablet  Take 10 mEq by mouth daily. 09/22/20  Yes [provider]  primidone (MYSOLINE) 50 MG tablet Take 2 tablets (100 mg total) by mouth 2 (two) times daily. 08/24/21 11/22/21 Yes Alric Ran, MD  promethazine-dextromethorphan (PROMETHAZINE-DM) 6.25-15 MG/5ML syrup Take 5 mLs by mouth 4 (four) times daily as needed for cough. 09/28/21  Yes Samuel Bouche, NP  QUEtiapine (SEROQUEL) 50 MG  tablet Take 2 tablets (100 mg total) by mouth at bedtime. 09/28/21 12/27/21 Yes Merian Capron, MD  ranolazine (RANEXA) 1000 MG SR tablet Take 1,000 mg by mouth in the morning and at bedtime.   Yes [provider]  rOPINIRole (REQUIP XL) 4 MG 24 hr tablet Take 1 tablet (4 mg total) by mouth at bedtime. 09/23/21 09/18/22 Yes Camara, Maryan Puls, MD  triamcinolone ointment (KENALOG) 0.1 % Apply 1 application. topically daily as needed (to affected areas- for psoriasis flares). 01/21/21  Yes [provider]  ferrous sulfate 325 (65 FE) MG EC tablet Take 1 tablet (325 mg total) by mouth in the morning and at bedtime. 03/19/21 09/13/21  Luetta Nutting, DO  mometasone-formoterol (DULERA) 200-5 MCG/ACT AERO Inhale 2 puffs into the lungs 2 (two) times daily. 09/14/21   Freddi Starr, MD  FLUoxetine (PROZAC) 20 MG capsule Take 1 capsule (20 mg total) by mouth daily. 09/21/21 09/28/21  Merian Capron, MD     Critical care time: Ferol Luz Zayana Salvador ACNP Acute Care Nurse Practitioner [place] Please consult Amion 10/05/2021, 11:52 AM

## 2021-10-06 ENCOUNTER — Encounter (HOSPITAL_COMMUNITY): Payer: Self-pay | Admitting: Internal Medicine

## 2021-10-06 ENCOUNTER — Encounter (HOSPITAL_COMMUNITY)
Admission: EM | Disposition: A | Discharge status: Skilled Nursing Facility | Payer: Self-pay | Source: Home / Self Care | Attending: Internal Medicine

## 2021-10-06 ENCOUNTER — Inpatient Hospital Stay (HOSPITAL_COMMUNITY): Payer: Medicare Other | Admitting: Certified Registered Nurse Anesthetist

## 2021-10-06 DIAGNOSIS — I2510 Atherosclerotic heart disease of native coronary artery without angina pectoris: Secondary | ICD-10-CM

## 2021-10-06 DIAGNOSIS — I10 Essential (primary) hypertension: Secondary | ICD-10-CM

## 2021-10-06 DIAGNOSIS — J168 Pneumonia due to other specified infectious organisms: Secondary | ICD-10-CM

## 2021-10-06 DIAGNOSIS — Z8673 Personal history of transient ischemic attack (TIA), and cerebral infarction without residual deficits: Secondary | ICD-10-CM | POA: Diagnosis not present

## 2021-10-06 DIAGNOSIS — I252 Old myocardial infarction: Secondary | ICD-10-CM

## 2021-10-06 DIAGNOSIS — J9601 Acute respiratory failure with hypoxia: Secondary | ICD-10-CM | POA: Diagnosis not present

## 2021-10-06 DIAGNOSIS — J189 Pneumonia, unspecified organism: Secondary | ICD-10-CM | POA: Diagnosis not present

## 2021-10-06 HISTORY — PX: FLEXIBLE BRONCHOSCOPY: SHX5094

## 2021-10-06 LAB — CBC
HCT: 29 % — ABNORMAL LOW (ref 39.0–52.0)
Hemoglobin: 9.1 g/dL — ABNORMAL LOW (ref 13.0–17.0)
MCH: 33.1 pg (ref 26.0–34.0)
MCHC: 31.4 g/dL (ref 30.0–36.0)
MCV: 105.5 fL — ABNORMAL HIGH (ref 80.0–100.0)
Platelets: 204 10*3/uL (ref 150–400)
RBC: 2.75 MIL/uL — ABNORMAL LOW (ref 4.22–5.81)
RDW: 15.5 % (ref 11.5–15.5)
WBC: 13 10*3/uL — ABNORMAL HIGH (ref 4.0–10.5)
nRBC: 0.2 % (ref 0.0–0.2)

## 2021-10-06 LAB — BASIC METABOLIC PANEL
Anion gap: 11 (ref 5–15)
BUN: 26 mg/dL — ABNORMAL HIGH (ref 8–23)
CO2: 19 mmol/L — ABNORMAL LOW (ref 22–32)
Calcium: 8.7 mg/dL — ABNORMAL LOW (ref 8.9–10.3)
Chloride: 107 mmol/L (ref 98–111)
Creatinine, Ser: 1.1 mg/dL (ref 0.61–1.24)
GFR, Estimated: >60 mL/min (ref >60)
Glucose, Bld: 232 mg/dL — ABNORMAL HIGH (ref 70–99)
Potassium: 4 mmol/L (ref 3.5–5.1)
Sodium: 137 mmol/L (ref 135–145)

## 2021-10-06 LAB — MAGNESIUM: Magnesium: 2 mg/dL (ref 1.7–2.4)

## 2021-10-06 LAB — BODY FLUID CELL COUNT WITH DIFFERENTIAL
Eos, Fluid: 0 %
Lymphs, Fluid: 14 %
Monocyte-Macrophage-Serous Fluid: 13 % — ABNORMAL LOW (ref 50–90)
Neutrophil Count, Fluid: 73 % — ABNORMAL HIGH (ref 0–25)
Total Nucleated Cell Count, Fluid: 79 cu mm (ref 0–1000)

## 2021-10-06 SURGERY — BRONCHOSCOPY, FLEXIBLE
Anesthesia: General

## 2021-10-06 MED ORDER — ALBUMIN HUMAN 5 % IV SOLN: 12.5000 g | Freq: Once | INTRAVENOUS | Status: AC

## 2021-10-06 MED ORDER — PROPOFOL 10 MG/ML IV BOLUS
INTRAVENOUS | Status: DC | PRN
  Administered 2021-10-06: 100 mg via INTRAVENOUS

## 2021-10-06 MED ORDER — ALBUMIN HUMAN 5 % IV SOLN
INTRAVENOUS | Status: AC
  Administered 2021-10-06: 12.5 g via INTRAVENOUS
  Filled 2021-10-06: qty 250

## 2021-10-06 MED ORDER — FLUMAZENIL 0.5 MG/5ML IV SOLN
0.5000 mg | INTRAVENOUS | Status: AC
  Filled 2021-10-06: qty 5

## 2021-10-06 MED ORDER — PHENYLEPHRINE HCL-NACL 20-0.9 MG/250ML-% IV SOLN
INTRAVENOUS | Status: DC | PRN
  Administered 2021-10-06: 25 ug/min via INTRAVENOUS

## 2021-10-06 MED ORDER — 0.9 % SODIUM CHLORIDE (POUR BTL) OPTIME
TOPICAL | Status: DC | PRN
  Administered 2021-10-06: 1000 mL

## 2021-10-06 MED ORDER — MIDAZOLAM HCL 2 MG/2ML IJ SOLN
INTRAMUSCULAR | Status: DC | PRN
  Administered 2021-10-06: 1 mg via INTRAVENOUS

## 2021-10-06 MED ORDER — SUCCINYLCHOLINE CHLORIDE 200 MG/10ML IV SOSY
PREFILLED_SYRINGE | INTRAVENOUS | Status: DC | PRN
  Administered 2021-10-06: 80 mg via INTRAVENOUS

## 2021-10-06 MED ORDER — LACTATED RINGERS IV SOLN: INTRAVENOUS | Status: DC | PRN

## 2021-10-06 MED ORDER — CHLORHEXIDINE GLUCONATE 0.12 % MT SOLN
15.0000 mL | Freq: Once | OROMUCOSAL | Status: DC
Start: 2021-10-06 — End: 2021-10-11
  Filled 2021-10-06: qty 15

## 2021-10-06 MED ORDER — LIDOCAINE HCL (PF) 1 % IJ SOLN
INTRAMUSCULAR | Status: AC
  Filled 2021-10-06: qty 30

## 2021-10-06 MED ORDER — VASOPRESSIN 20 UNIT/ML IV SOLN
INTRAVENOUS | Status: AC
Start: 2021-10-06 — End: ?
  Filled 2021-10-06: qty 1

## 2021-10-06 MED ORDER — MIDAZOLAM HCL 2 MG/2ML IJ SOLN
INTRAMUSCULAR | Status: AC
  Filled 2021-10-06: qty 2

## 2021-10-06 MED ORDER — LIDOCAINE HCL (PF) 1 % IJ SOLN
INTRAMUSCULAR | Status: AC
  Filled 2021-10-06: qty 5

## 2021-10-06 MED ORDER — LIDOCAINE 2% (20 MG/ML) 5 ML SYRINGE
INTRAMUSCULAR | Status: DC | PRN
  Administered 2021-10-06: 60 mg via INTRAVENOUS

## 2021-10-06 MED ORDER — ONDANSETRON HCL 4 MG/2ML IJ SOLN
INTRAMUSCULAR | Status: DC | PRN
  Administered 2021-10-06: 4 mg via INTRAVENOUS

## 2021-10-06 MED ORDER — EPINEPHRINE PF 1 MG/ML IJ SOLN
INTRAMUSCULAR | Status: AC
  Filled 2021-10-06: qty 1

## 2021-10-06 MED ORDER — VASOPRESSIN 20 UNIT/ML IV SOLN
INTRAVENOUS | Status: DC | PRN
  Administered 2021-10-06 (×5): 1 [IU] via INTRAVENOUS

## 2021-10-06 MED ORDER — PROPOFOL 500 MG/50ML IV EMUL
INTRAVENOUS | Status: DC | PRN
  Administered 2021-10-06: 100 ug/kg/min via INTRAVENOUS

## 2021-10-06 MED ORDER — PROPOFOL 10 MG/ML IV BOLUS
INTRAVENOUS | Status: AC
  Filled 2021-10-06: qty 20

## 2021-10-06 MED ORDER — FENTANYL CITRATE (PF) 250 MCG/5ML IJ SOLN
INTRAMUSCULAR | Status: AC
  Filled 2021-10-06: qty 5

## 2021-10-06 SURGICAL SUPPLY — 25 items
BAG COUNTER SPONGE SURGICOUNT (BAG) ×2 IMPLANT
BLOCK BITE 60FR ADLT L/F BLUE (MISCELLANEOUS) IMPLANT
BRUSH CYTOL CELLEBRITY 1.5X140 (MISCELLANEOUS) IMPLANT
CANISTER SUCT 3000ML PPV (MISCELLANEOUS) ×2 IMPLANT
CNTNR URN SCR LID CUP LEK RST (MISCELLANEOUS) ×2 IMPLANT
CONT SPEC 4OZ STRL OR WHT (MISCELLANEOUS) ×2
COTTONBALL LRG STERILE PKG (GAUZE/BANDAGES/DRESSINGS) IMPLANT
COVER BACK TABLE 60X90IN (DRAPES) ×2 IMPLANT
FILTER STRAW FLUID ASPIR (MISCELLANEOUS) IMPLANT
FORCEPS BIOP RJ4 1.8 (CUTTING FORCEPS) IMPLANT
GAUZE SPONGE 4X4 12PLY STRL (GAUZE/BANDAGES/DRESSINGS) ×2 IMPLANT
GLOVE BIO SURGEON STRL SZ 6.5 (GLOVE) ×2 IMPLANT
KIT CLEAN ENDO COMPLIANCE (KITS) ×2 IMPLANT
KIT TURNOVER KIT B (KITS) ×2 IMPLANT
NEEDLE 22X1 1/2 (OR ONLY) (NEEDLE) IMPLANT
NS IRRIG 1000ML POUR BTL (IV SOLUTION) ×2 IMPLANT
OIL SILICONE PENTAX (PARTS (SERVICE/REPAIRS)) ×2 IMPLANT
PAD ARMBOARD 7.5X6 YLW CONV (MISCELLANEOUS) ×4 IMPLANT
SYR 20ML ECCENTRIC (SYRINGE) ×2 IMPLANT
SYR 5ML LL (SYRINGE) ×2 IMPLANT
SYR 5ML LUER SLIP (SYRINGE) ×2 IMPLANT
SYR CONTROL 10ML LL (SYRINGE) IMPLANT
TOWEL GREEN STERILE FF (TOWEL DISPOSABLE) ×2 IMPLANT
TRAP SPECIMEN MUCUS 40CC (MISCELLANEOUS) ×2 IMPLANT
TUBE CONNECTING 20X1/4 (TUBING) ×2 IMPLANT

## 2021-10-06 NOTE — Transfer of Care (Signed)
Immediate Anesthesia Transfer of Care Note  Patient: Mark Thomas  Procedure(s) Performed: FLEXIBLE BRONCHOSCOPY  Patient Location: PACU  Anesthesia Type:General  Level of Consciousness: awake, patient cooperative and responds to stimulation  Airway & Oxygen Therapy: Patient Spontanous Breathing and Patient connected to face mask oxygen  Post-op Assessment: Report given to RN and Post -op Vital signs reviewed and stable  Post vital signs: Reviewed and stable  Last Vitals:  Vitals Value Taken Time  BP 105/70 10/06/21 1346  Temp    Pulse 79 10/06/21 1349  Resp 19 10/06/21 1349  SpO2 97 % 10/06/21 1349  Vitals shown include unvalidated device data.  Last Pain:  Vitals:   10/06/21 1154  TempSrc: Oral  PainSc:       Patients Stated Pain Goal: 2 (56/38/75 6433)  Complications: No notable events documented.

## 2021-10-06 NOTE — Progress Notes (Signed)
SLP Cancellation Note  Patient Details Name: Mark Thomas MRN: [id number] DOB: 01/05/1951   Cancelled treatment:       Reason Eval/Treat Not Completed: Other (comment) (Patient NPO for bronchoscopy today at 1330). SLP will f/u with patient when able to take PO's.   Of note, patient had MBS on 08/27/21 during recent, previous admission and at that time oral and pharyngeal phases of swallow were both [facility] with no penetration or aspiration of liquids or solids observed and with 83m barium tablet transiting through esophagus without delay.    JSonia Baller MA, CCC-SLP Speech Therapy

## 2021-10-06 NOTE — Plan of Care (Signed)

## 2021-10-06 NOTE — TOC Initial Note (Addendum)
Transition of Care [facility]) - Initial/Assessment Note    Patient Details  Name: Mark Thomas MRN: [id number] Date of Birth: 1950/06/06  Transition of Care [facility]) CM/SW Contact:    Cyndi Bender, RN Phone Number: 10/06/2021, 12:47 PM  Clinical Narrative:                  Patient has had numerous hospitalizations for recurrent PNA-presenting with SOB-found to have hypoxia due to multifocal PNA. Patient having bronchoscopy today. TOC will follow for needs.     10/07/21 1514 [facility] active with [facility] for RN and PT.  Will need resumption home health orders for RN and PT.  Patient Goals and CMS Choice        Expected Discharge Plan and Services                                                Prior Living Arrangements/Services                       Activities of Daily Living Home Assistive Devices/Equipment: None ADL Screening (condition at time of admission) Patient's cognitive ability adequate to safely complete daily activities?: Yes Is the patient deaf or have difficulty hearing?: No Does the patient have difficulty seeing, even when wearing glasses/contacts?: No Does the patient have difficulty concentrating, remembering, or making decisions?: No Patient able to express need for assistance with ADLs?: Yes Does the patient have difficulty dressing or bathing?: No Independently performs ADLs?: Yes (appropriate for developmental age) Does the patient have difficulty walking or climbing stairs?: Yes Weakness of Legs: Both Weakness of Arms/Hands: None  Permission Sought/Granted                  Emotional Assessment              Admission diagnosis:  Pneumonia [J18.9] Acute respiratory failure with hypoxia ([place]) [J96.01] Community acquired pneumonia of right lung, unspecified part of lung [J18.9] Patient Active Problem List   Diagnosis Date Noted   Supraventricular tachycardia ([place]) 10/05/2021   History of CVA  (cerebrovascular accident) 10/04/2021   Hemoptysis 10/04/2021   Shingles 09/14/2021   CAD (coronary artery disease) 08/26/2021   Sick sinus syndrome s/p PPM  08/26/2021   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, likely ILD  08/26/2021   Acute respiratory failure with hypoxia ([place]) 08/26/2021   Dyspnea 08/26/2021   Hypotension 08/04/2021   Kidney cysts 08/04/2021   Coughing 07/28/2021   Seborrheic keratosis 07/28/2021   Peripheral venous insufficiency 07/07/2021   Psoriasis 07/07/2021   Sensorineural hearing loss, unilateral, left ear, with unrestricted hearing on the contralateral side 07/07/2021   Community acquired pneumonia 07/07/2021   Other specified problems related to psychosocial circumstances 03/30/2021   Tinea 03/30/2021   Xerosis cutis 03/30/2021   Stroke ([place]) 02/02/2021   Myocardial infarct ([place]) 02/02/2021   Weakness 01/09/2021   Aortic stenosis 01/08/2021   Vascular dementia with increased confusion/anger 01/08/2021   TIA (transient ischemic attack) 01/07/2021   Rotator cuff arthropathy of right shoulder 12/30/2020   Coronary arteriosclerosis 12/24/2020   Chronic alcoholism in remission ([place]) 12/24/2020   Disorder of bursae and tendons in shoulder region 12/24/2020   Gastric ulcer with hemorrhage 12/24/2020   Gout 12/24/2020   Major depressive disorder, single episode, severe, 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[place]) 12/24/2020   Mitral  valve prolapse 12/24/2020   Other and unspecified mycoses 12/24/2020   Chronic gingivitis, non-plaque induced 12/24/2020   Posttraumatic stress disorder 12/24/2020   Paroxysmal atrial fibrillation (HCC) s/p Watchman device 12/24/2020   Essential tremor 12/24/2020   GAD (generalized anxiety disorder) 12/24/2020   Anemia 09/30/2020   Restless legs syndrome (RLS) 09/30/2020   Elevated liver enzymes 05/26/2020   Orthostatic hypotension 10/01/2018   Depression 06/01/2018   Gastroesophageal reflux disease without esophagitis 12/13/2017    Insomnia 09/26/2017   CKD (chronic kidney disease) stage 3, GFR 30-59 ml/min (HCC) 09/14/2017   Pacemaker 02/17/2017   History of cardiac radiofrequency ablation (RFA) 12/18/2016   Sinus bradycardia 12/18/2016   Erectile dysfunction 02/12/2013   Other and unspecified hyperlipidemia 02/12/2013   Presence of stent in LAD coronary artery 02/12/2013   PCP:  Luetta Nutting, DO Pharmacy:   [id number] - [place], [place] - [address] AT [place] [address] [place] [address] Phone: [phone] Fax: [phone]  [facility] [place], [place] [address] [place] [address] Phone: [phone] Fax: [phone]     Social Determinants of Health (SDOH) Interventions    Readmission Risk Interventions     View : No data to display.

## 2021-10-06 NOTE — Anesthesia Postprocedure Evaluation (Signed)
Anesthesia Post Note  Patient: Mark Thomas  Procedure(s) Performed: [employer]     Patient location during evaluation: PACU Anesthesia Type: General Level of consciousness: awake and alert Pain management: pain level controlled Vital Signs Assessment: post-procedure vital signs reviewed and stable Respiratory status: spontaneous breathing, nonlabored ventilation and respiratory function stable Cardiovascular status: blood pressure returned to baseline and stable Postop Assessment: no apparent nausea or vomiting Anesthetic complications: no   No notable events documented.  Last Vitals:  Vitals:   10/06/21 1445 10/06/21 1500  BP: 98/68 92/65  Pulse: 74 76  Resp: (!) 21 (!) 22  Temp:    SpO2: 99% 95%    Last Pain:  Vitals:   10/06/21 1445  TempSrc:   PainSc: 0-No pain                 Charli Halle,W. EDMOND

## 2021-10-06 NOTE — Anesthesia Procedure Notes (Signed)
Procedure Name: Intubation Date/Time: 10/06/2021 12:27 PM Performed by: Valda Favia, CRNA Pre-anesthesia Checklist: Patient identified, Emergency Drugs available, Suction available and Patient being monitored Patient Re-evaluated:Patient Re-evaluated prior to induction Oxygen Delivery Method: Circle System Utilized Preoxygenation: Pre-oxygenation with 100% oxygen Induction Type: IV induction Ventilation: Oral airway inserted - appropriate to patient size Laryngoscope Size: Mac and 4 Grade View: Grade III Tube type: Oral Tube size: 8.0 mm Number of attempts: 1 Airway Equipment and Method: Stylet and Oral airway Placement Confirmation: ETT inserted through vocal cords under direct vision, positive ETCO2 and breath sounds checked- equal and bilateral Secured at: 22 cm Tube secured with: Tape Dental Injury: Teeth and Oropharynx as per pre-operative assessment

## 2021-10-06 NOTE — Anesthesia Preprocedure Evaluation (Addendum)
Anesthesia Evaluation  Patient identified by MRN, date of birth, ID band Patient awake    Reviewed: Allergy & Precautions, H&P , NPO status , Patient's Chart, lab work & pertinent test results  Airway Mallampati: II  TM Distance: >3 FB Neck ROM: Full    Dental no notable dental hx. (+) Teeth Intact, Dental Advisory Given   Pulmonary shortness of breath, pneumonia, unresolved,     + decreased breath sounds      Cardiovascular hypertension, Pt. on medications + CAD and + Past MI  + pacemaker + Valvular Problems/Murmurs AS  Rhythm:Regular Rate:Normal + Systolic murmurs    Neuro/Psych Anxiety Depression Dementia CVA    GI/Hepatic Neg liver ROS, PUD, GERD  Medicated,  Endo/Other  negative endocrine ROS  Renal/GU negative Renal ROS  negative genitourinary   Musculoskeletal   Abdominal   Peds  Hematology  (+) Blood dyscrasia, anemia ,   Anesthesia Other Findings   Reproductive/Obstetrics negative OB ROS                            Anesthesia Physical Anesthesia Plan  ASA: 3  Anesthesia Plan: General   Post-op Pain Management: Tylenol PO (pre-op)*   Induction: Intravenous  PONV Risk Score and Plan: 3 and Ondansetron, Propofol infusion and TIVA  Airway Management Planned: Oral ETT  Additional Equipment:   Intra-op Plan:   Post-operative Plan: Extubation in OR  Informed Consent: I have reviewed the patients History and Physical, chart, labs and discussed the procedure including the risks, benefits and alternatives for the proposed anesthesia with the patient or authorized representative who has indicated his/her understanding and acceptance.     Dental advisory given  Plan Discussed with: CRNA  Anesthesia Plan Comments:         Anesthesia Quick Evaluation

## 2021-10-06 NOTE — Progress Notes (Addendum)
PROGRESS NOTE        PATIENT DETAILS Name: Mark Thomas Age: 71 y.o. Sex: male Date of Birth: February 10, 1951 Admit Date: 10/03/2021 Admitting Physician Bonnell Public, MD JXB:[id number], Einar Pheasant, DO  Brief Summary: Patient is a 71 y.o.  male history of CAD-s/p PCI PAF-s/p watchman's device, CVA who has numerous hospitalizations for recurrent PNA-presenting with SOB-found to have hypoxia due to multifocal PNA and subsequently admitted to the hospitalist service.    Significant events: 4/05-4/10>> hospitalization for PNA 4/27-4/30>> hospitalization for sepsis/hypoxia due to PNA. 6/04>> admit to [facility]-SOB-due to pneumonia.  Significant studies: 4/28>> TTE: EF 21-30%, grade 2 diastolic dysfunction, RVSP 49.5, moderate aortic stenosis 6/04>> CXR: Worsening opacities in the left lung  Significant microbiology data: 6/04>> COVID/INFLUENZA PCR: Negative 6/04>> blood culture: Negative  Procedures: None  Consults: PCCM  Subjective: Lying comfortably in bed-denies any chest pain or shortness of breath.  On room air this morning.  Objective: Vitals: Blood pressure (!) 131/99, pulse 88, temperature (!) 97.5 F (36.4 C), temperature source Oral, resp. rate 20, height _0  (1.854 m), weight 117 kg, SpO2 99 %.   Exam: Gen Exam:Alert awake-not in any distress HEENT:atraumatic, normocephalic Chest: B/L clear to auscultation anteriorly CVS:S1S2 regular Abdomen:soft non tender, non distended Extremities:no edema Neurology: Non focal Skin: no rash  Pertinent Labs/Radiology:    Latest Ref Rng & Units 10/06/2021    2:52 AM 10/05/2021    8:05 AM 10/04/2021    2:36 PM  CBC  WBC 4.0 - 10.5 K/uL 13.0   11.8   10.6    Hemoglobin 13.0 - 17.0 g/dL 9.1   10.2   9.4    Hematocrit 39.0 - 52.0 % 29.0   31.1   29.1    Platelets 150 - 400 K/uL 204   190   188      Lab Results  Component Value Date   NA 137 10/06/2021   K 4.0 10/06/2021   CL 107 10/06/2021   CO2 19 (L)  10/06/2021      Assessment/Plan: Acute hypoxic respiratory failure due to recurrent multifocal pneumonia: Hypoxia has resolved-on room air-continue empiric antibiotics-PCCM planning bronchoscopy today.  History of recurrent pneumonia: Occurring since COVID-19 April 2021-apparently improved after course of antibiotics/steroids-bronchoscopy planned later today.  History of interstitial lung disease: Suspected GERD contributing to his ILD.  Supportive care-on PPI.  SVT: Continue beta-blocker-evaluated by cardiology-not felt to have A-fib-stop amiodarone-continue beta-blocker.  History of PAF-s/p watchman's device 03/2019 and RFA with isolation of all 4 pulmonary veins in 2021: Continue telemetry monitoring.  Per cardiology note-not felt to have A-fib.  No longer on anticoagulation with watchman's device in place.  History of CAD s/p multiple PCI's: Currently without any anginal symptoms.  Continue aspirin/Plavix/statin/beta-blocker.  History of ASD-s/p closure 01/22/2020  History of moderate aortic stenosis: Stable for outpatient follow-up with cardiology.  History of CVA: No focal deficits.  Continue antiplatelets-statin.  History of sick sinus syndrome-s/p PPM placement: Continue telemetry monitoring.  History of vascular dementia: Appears to be mild-continue Namenda//Seroquel.  Maintain delirium precautions.  History of anxiety/depression: Continue Lexapro  History of orthostatic hypotension: Continue midodrine  GERD: Continue PPI  Obesity: Estimated body mass index is 34.03 kg/m as calculated from the following:   Height as of this encounter: _1  (1.854 m).   Weight as of this encounter: 117 kg.  Code status:   Code Status: Full Code   DVT Prophylaxis: enoxaparin (LOVENOX) injection 40 mg Start: 10/04/21 2200   Family Communication: None at bedside   Disposition Plan: Status is: Inpatient Remains inpatient appropriate because: Recurrent PNA-for bronchoscopy  today.   Planned Discharge Destination:Home   Diet: Diet Order             Diet NPO time specified  Diet effective midnight                     Antimicrobial agents: Anti-infectives (From admission, onward)    Start     Dose/Rate Route Frequency Ordered Stop   10/05/21 1800  vancomycin (VANCOREADY) IVPB 1250 mg/250 mL        1,250 mg 166.7 mL/hr over 90 Minutes Intravenous Every 12 hours 10/05/21 0957     10/04/21 0615  Vancomycin (VANCOCIN) 1,500 mg in sodium chloride 0.9 % 500 mL IVPB        1,500 mg 250 mL/hr over 120 Minutes Intravenous  Once 10/04/21 0600 10/04/21 0926   10/04/21 0605  vancomycin (VANCOCIN) 1000 MG powder       Note to Pharmacy: April Holding C: cabinet override      10/04/21 0605 10/04/21 1814   10/04/21 0605  vancomycin (VANCOCIN) 500 MG powder       Note to Pharmacy: April Holding C: cabinet override      10/04/21 0605 10/04/21 1814   10/04/21 0400  vancomycin (VANCOREADY) IVPB 1500 mg/300 mL  Status:  Discontinued        1,500 mg 150 mL/hr over 120 Minutes Intravenous Every 12 hours 10/03/21 1357 10/05/21 0957   10/03/21 1445  vancomycin (VANCOCIN) IVPB 1000 mg/200 mL premix       See Hyperspace for full Linked Orders Report.   1,000 mg 200 mL/hr over 60 Minutes Intravenous  Once 10/03/21 1335 10/03/21 1938   10/03/21 1400  ceFEPIme (MAXIPIME) 2 g in sodium chloride 0.9 % 100 mL IVPB        2 g 200 mL/hr over 30 Minutes Intravenous Every 8 hours 10/03/21 1356     10/03/21 1345  vancomycin (VANCOCIN) IVPB 1000 mg/200 mL premix       See Hyperspace for full Linked Orders Report.   1,000 mg 200 mL/hr over 60 Minutes Intravenous  Once 10/03/21 1335 10/03/21 1846   10/03/21 1330  vancomycin (VANCOCIN) IVPB 1000 mg/200 mL premix  Status:  Discontinued        1,000 mg 200 mL/hr over 60 Minutes Intravenous  Once 10/03/21 1321 10/03/21 1335   10/03/21 1330  ceFEPIme (MAXIPIME) 2 g in sodium chloride 0.9 % 100 mL IVPB  Status:  Discontinued         2 g 200 mL/hr over 30 Minutes Intravenous  Once 10/03/21 1321 10/03/21 1356   10/03/21 1330  azithromycin (ZITHROMAX) 500 mg in sodium chloride 0.9 % 250 mL IVPB        500 mg 250 mL/hr over 60 Minutes Intravenous  Once 10/03/21 1321 10/03/21 1710        MEDICATIONS: Scheduled Meds:  allopurinol  100 mg Oral Daily   aspirin EC  81 mg Oral q AM   atorvastatin  80 mg Oral Daily   bacitracin   Topical BID   chlorpheniramine-HYDROcodone  5 mL Oral Q12H   cholecalciferol  400 Units Oral Daily   clopidogrel  75 mg Oral Daily   enoxaparin (LOVENOX) injection  40 mg Subcutaneous  Q24H   escitalopram  30 mg Oral Daily   fludrocortisone  0.1 mg Oral BID   hydrocortisone cream   Topical TID   latanoprost  1 drop Both Eyes QHS   lidocaine  1 patch Transdermal Q24H   magnesium oxide  400 mg Oral Daily   memantine  10 mg Oral BID   metoprolol tartrate  12.5 mg Oral BID   midodrine  5 mg Oral TID WC   mometasone-formoterol  2 puff Inhalation BID   ondansetron (ZOFRAN) IV  4 mg Intravenous Once   pantoprazole  40 mg Oral QPM   potassium citrate  10 mEq Oral Daily   primidone  100 mg Oral BID   QUEtiapine  100 mg Oral QHS   ranolazine  1,000 mg Oral BID   rOPINIRole  4 mg Oral QHS   Continuous Infusions:  sodium chloride 10 mL/hr at 10/04/21 0539   amiodarone 30 mg/hr (10/06/21 0931)   ceFEPime (MAXIPIME) IV 2 g (10/06/21 0546)   vancomycin 1,250 mg (10/06/21 0558)   PRN Meds:.sodium chloride, acetaminophen, guaiFENesin, HYDROcodone-acetaminophen, ipratropium-albuterol   I have personally reviewed following labs and imaging studies  LABORATORY DATA: CBC: Recent Labs  Lab 10/03/21 1505 10/04/21 1436 10/05/21 0805 10/06/21 0252  WBC 13.3* 10.6* 11.8* 13.0*  NEUTROABS 10.7*  --   --   --   HGB 10.4* 9.4* 10.2* 9.1*  HCT 30.8* 29.1* 31.1* 29.0*  MCV 100.7* 102.5* 102.0* 105.5*  PLT 209 188 190 812    Basic Metabolic Panel: Recent Labs  Lab 10/03/21 1505 10/04/21 1436  10/05/21 0805 10/06/21 0252  NA 135 138 137 137  K 4.2 3.2* 3.8 4.0  CL 102 106 108 107  CO2 25 23 21* 19*  GLUCOSE 128* 113* 182* 232*  BUN _0 26*  CREATININE 1.01 0.99 0.82 1.10  CALCIUM 8.5* 8.3* 8.5* 8.7*  MG  --   --  1.9 2.0    GFR: Estimated Creatinine Clearance: 86.1 mL/min (by C-G formula based on SCr of 1.1 mg/dL).  Liver Function Tests: Recent Labs  Lab 10/03/21 1505  AST 26  ALT 20  ALKPHOS 56  BILITOT 1.0  PROT 7.1  ALBUMIN 2.8*   No results for input(s): LIPASE, AMYLASE in the last 168 hours. No results for input(s): AMMONIA in the last 168 hours.  Coagulation Profile: Recent Labs  Lab 10/03/21 1505  INR 1.2    Cardiac Enzymes: No results for input(s): CKTOTAL, CKMB, CKMBINDEX, TROPONINI in the last 168 hours.  BNP (last 3 results) Recent Labs    03/18/21 1004  PROBNP 370    Lipid Profile: No results for input(s): CHOL, HDL, LDLCALC, TRIG, CHOLHDL, LDLDIRECT in the last 72 hours.  Thyroid Function Tests: No results for input(s): TSH, T4TOTAL, FREET4, T3FREE, THYROIDAB in the last 72 hours.  Anemia Panel: No results for input(s): VITAMINB12, FOLATE, FERRITIN, TIBC, IRON, RETICCTPCT in the last 72 hours.  Urine analysis:    Component Value Date/Time   COLORURINE YELLOW 10/03/2021 2159   APPEARANCEUR CLEAR 10/03/2021 2159   LABSPEC 1.010 10/03/2021 2159   PHURINE 7.0 10/03/2021 2159   GLUCOSEU NEGATIVE 10/03/2021 2159   HGBUR NEGATIVE 10/03/2021 2159   BILIRUBINUR NEGATIVE 10/03/2021 2159   [place] NEGATIVE 10/03/2021 2159   PROTEINUR NEGATIVE 10/03/2021 2159   NITRITE NEGATIVE 10/03/2021 2159   LEUKOCYTESUR NEGATIVE 10/03/2021 2159    Sepsis Labs: Lactic Acid, Venous    Component Value Date/Time   LATICACIDVEN 1.3 10/03/2021 1505  MICROBIOLOGY: Recent Results (from the past 240 hour(s))  Culture, blood (Routine x 2)     Status: None (Preliminary result)   Collection Time: 10/03/21  3:00 PM   Specimen: BLOOD LEFT  ARM  Result Value Ref Range Status   Specimen Description   Final    BLOOD LEFT ARM BLOOD Performed at [facility], [address] [place], [place] [address]    Special Requests   Final    Blood Culture adequate volume BOTTLES DRAWN AEROBIC AND ANAEROBIC Performed at [facility], [address] [place], [place] [address]    Culture   Final    NO GROWTH 3 DAYS Performed at [facility], [place] [address] [place], [place] [address]    Report Status PENDING  Incomplete  Resp Panel by RT-PCR (Flu A&B, Covid) Anterior Nasal Swab     Status: None   Collection Time: 10/03/21  3:05 PM   Specimen: Anterior Nasal Swab  Result Value Ref Range Status   SARS Coronavirus 2 by RT PCR NEGATIVE NEGATIVE Final    Comment: (NOTE) SARS-CoV-2 target nucleic acids are NOT DETECTED.  The SARS-CoV-2 RNA is generally detectable in upper respiratory specimens during the acute phase of infection. The lowest concentration of SARS-CoV-2 viral copies this assay can detect is 138 copies/mL. A negative result does not preclude SARS-Cov-2 infection and should not be used as the sole basis for treatment or other patient management decisions. A negative result may occur with  improper specimen collection/handling, submission of specimen other than nasopharyngeal swab, presence of viral mutation(s) within the areas targeted by this assay, and inadequate number of viral copies(<138 copies/mL). A negative result must be combined with clinical observations, patient history, and epidemiological information. The expected result is Negative.  Fact Sheet for Patients:  EntrepreneurPulse.com.au  Fact Sheet for Healthcare Providers:  IncredibleEmployment.be  This test is no t yet approved or cleared by the [place] FDA and  has been authorized for detection and/or diagnosis of SARS-CoV-2 by FDA under an Emergency Use Authorization (EUA). This EUA  will remain  in effect (meaning this test can be used) for the duration of the COVID-19 declaration under Section 564(b)(1) of the Act, 21 U.S.C.section 360bbb-3(b)(1), unless the authorization is terminated  or revoked sooner.       Influenza A by PCR NEGATIVE NEGATIVE Final   Influenza B by PCR NEGATIVE NEGATIVE Final    Comment: (NOTE) The Xpert Xpress SARS-CoV-2/FLU/RSV plus assay is intended as an aid in the diagnosis of influenza from Nasopharyngeal swab specimens and should not be used as a sole basis for treatment. Nasal washings and aspirates are unacceptable for Xpert Xpress SARS-CoV-2/FLU/RSV testing.  Fact Sheet for Patients: EntrepreneurPulse.com.au  Fact Sheet for Healthcare Providers: IncredibleEmployment.be  This test is not yet approved or cleared by the [place] FDA and has been authorized for detection and/or diagnosis of SARS-CoV-2 by FDA under an Emergency Use Authorization (EUA). This EUA will remain in effect (meaning this test can be used) for the duration of the COVID-19 declaration under Section 564(b)(1) of the Act, 21 U.S.C. section 360bbb-3(b)(1), unless the authorization is terminated or revoked.  Performed at [facility], [address] [place], [place] [address]   Culture, blood (Routine x 2)     Status: None (Preliminary result)   Collection Time: 10/04/21  2:37 PM   Specimen: BLOOD LEFT HAND  Result Value Ref Range Status   Specimen  Description BLOOD LEFT HAND  Final   Special Requests   Final    BOTTLES DRAWN AEROBIC AND ANAEROBIC Blood Culture adequate volume   Culture   Final    NO GROWTH 2 DAYS Performed at [facility], [address], [place], [place] [address]    Report Status PENDING  Incomplete    RADIOLOGY STUDIES/RESULTS: No results found.   LOS: 2 days   Oren Binet, MD  [facility]    To contact the attending provider between 7A-7P or the  covering provider during after hours 7P-7A, please log into the web site [email or web address] and access using universal [place] password for that web site. If you do not have the password, please call the hospital operator.  10/06/2021, 9:48 AM

## 2021-10-06 NOTE — Progress Notes (Signed)
RT called to pt room for PRN treatment. Upon arrival pt in no distress and declines treatment at this time. Pt is being prepped for scheduled procedure at this time. RT will continue to monitor and be available as needed.

## 2021-10-06 NOTE — Op Note (Signed)
[facility] Cardiopulmonary Patient Name: Mark Thomas Pocedure Date: 10/06/2021 MRN: [id number] Attending MD: Rodman Pickle , MD Date of Birth: 05/05/1950 CSN: Finalized Age: 71 Admit Type: Inpatient Gender: Male Procedure:             Bronchoscopy Indications:           Atelectasis of the left upper lobe Providers:             Rodman Pickle, MD Referring MD:           Medicines:             General Anesthesia Complications:         No immediate complications Estimated Blood Loss:  Estimated blood loss: none. Procedure:             Pre-Anesthesia Assessment:                        - A History and Physical has been performed. The                         patient's medications, allergies and sensitivities                         have been reviewed.                        - The risks and benefits of the procedure and the                         sedation options and risks were discussed with the                         patient. All questions were answered and informed                         consent was obtained.                        - A History and Physical has been performed. Patient                         meds and allergies have been reviewed. The risks and                         benefits of the procedure and the sedation options and                         risks were discussed with the patient. All questions                         were answered and informed consent was obtained.                         Patient identification and proposed procedure were                         verified prior to the procedure in the pre-procedure  area. Mental Status Examination: alert and oriented.                         Airway Examination: normal oropharyngeal airway.                         Respiratory Examination: clear to auscultation. CV                         Examination: RRR, no murmurs, no S3 or S4. ASA Grade                         Assessment: III  - A patient with severe systemic                         disease. After reviewing the risks and benefits, the                         patient was deemed in satisfactory condition to                         undergo the procedure. The anesthesia plan was to use                         monitored anesthesia care (MAC). Immediately prior to                         administration of medications, the patient was                         re-assessed for adequacy to receive sedatives. The                         heart rate, respiratory rate, oxygen saturations,                         blood pressure, adequacy of pulmonary ventilation, and                         response to care were monitored throughout the                         procedure. The physical status of the patient was                         re-assessed after the procedure.                        After obtaining informed consent, the bronchoscope was                         passed under direct vision. Throughout the procedure,                         the patient's blood pressure, pulse, and oxygen                         saturations were monitored continuously.The procedure  was accomplished without difficulty. The patient                         tolerated the procedure well. the [id number] ([id number])                         Olympus bronchoscope was introduced through the mouth,                         via the endotracheal tube and advanced to the                         tracheobronchial tree. Findings:      The endotracheal tube is in good position. The visualized portion of the       trachea is of normal caliber. The carina is sharp. The tracheobronchial       tree was examined to at least the first subsegmental level. Bronchial       mucosa is boggy and anatomy with distoration and airway compression of       subsegements but no occlusion; no evidence of active bleed. There are no       endobronchial lesions.  Minimal secretions presents.      Bronchoalveolar lavage was performed in the LUL superior lingular       segment (B4) of the lung and sent for cell count, bacterial culture,       viral smears & culture, fungal & AFB analysis and cytology for       immunocompromised host protocol. 100 mL of fluid were instilled. 40 mL       were returned. The return was blood-tinged. There were no mucoid plugs       in the return fluid. Impression:            - The airway examination was normal.                        - Bronchoalveolar lavage was performed on lingula. Moderate Sedation:      General anesthesia Recommendation:        - Await BAL results. Procedure Code(s):     --- Professional ---                        [phone], Bronchoscopy, rigid or flexible, including                         fluoroscopic guidance, when performed; with bronchial                         alveolar lavage Diagnosis Code(s):     --- Professional ---                        J16.8, Pneumonia due to other specified infectious                         organisms CPT copyright 2019 [employer]. All rights reserved. The codes documented in this report are preliminary and upon coder review may  be revised to meet current compliance requirements. Rodman Pickle, MD 10/06/2021 1:49:39 PM This report has been signed electronically. Number of Addenda: 0

## 2021-10-07 ENCOUNTER — Encounter (HOSPITAL_COMMUNITY): Payer: Self-pay | Admitting: Pulmonary Disease

## 2021-10-07 DIAGNOSIS — J189 Pneumonia, unspecified organism: Secondary | ICD-10-CM | POA: Diagnosis not present

## 2021-10-07 DIAGNOSIS — I2510 Atherosclerotic heart disease of native coronary artery without angina pectoris: Secondary | ICD-10-CM | POA: Diagnosis not present

## 2021-10-07 DIAGNOSIS — Z8673 Personal history of transient ischemic attack (TIA), and cerebral infarction without residual deficits: Secondary | ICD-10-CM | POA: Diagnosis not present

## 2021-10-07 DIAGNOSIS — J9601 Acute respiratory failure with hypoxia: Secondary | ICD-10-CM | POA: Diagnosis not present

## 2021-10-07 LAB — BASIC METABOLIC PANEL
Anion gap: 8 (ref 5–15)
BUN: 28 mg/dL — ABNORMAL HIGH (ref 8–23)
CO2: 23 mmol/L (ref 22–32)
Calcium: 8.7 mg/dL — ABNORMAL LOW (ref 8.9–10.3)
Chloride: 109 mmol/L (ref 98–111)
Creatinine, Ser: 0.94 mg/dL (ref 0.61–1.24)
GFR, Estimated: >60 mL/min (ref >60)
Glucose, Bld: 114 mg/dL — ABNORMAL HIGH (ref 70–99)
Potassium: 4.5 mmol/L (ref 3.5–5.1)
Sodium: 140 mmol/L (ref 135–145)

## 2021-10-07 LAB — CBC
HCT: 27.1 % — ABNORMAL LOW (ref 39.0–52.0)
Hemoglobin: 8.7 g/dL — ABNORMAL LOW (ref 13.0–17.0)
MCH: 33.5 pg (ref 26.0–34.0)
MCHC: 32.1 g/dL (ref 30.0–36.0)
MCV: 104.2 fL — ABNORMAL HIGH (ref 80.0–100.0)
Platelets: 175 10*3/uL (ref 150–400)
RBC: 2.6 MIL/uL — ABNORMAL LOW (ref 4.22–5.81)
RDW: 16 % — ABNORMAL HIGH (ref 11.5–15.5)
WBC: 9.1 10*3/uL (ref 4.0–10.5)
nRBC: 0.2 % (ref 0.0–0.2)

## 2021-10-07 LAB — ACID FAST SMEAR (AFB, MYCOBACTERIA): Acid Fast Smear: NEGATIVE

## 2021-10-07 LAB — CYTOLOGY - NON PAP

## 2021-10-07 LAB — MAGNESIUM: Magnesium: 2.1 mg/dL (ref 1.7–2.4)

## 2021-10-07 MED ORDER — PREDNISONE 20 MG PO TABS
40.0000 mg | ORAL_TABLET | Freq: Every day | ORAL | Status: DC
  Administered 2021-10-08 – 2021-10-14 (×7): 40 mg via ORAL
  Filled 2021-10-07 (×7): qty 2

## 2021-10-07 MED ORDER — HYDROCODONE-ACETAMINOPHEN 5-325 MG PO TABS
1.0000 | ORAL_TABLET | Freq: Four times a day (QID) | ORAL | Status: DC | PRN
  Administered 2021-10-07: 1 via ORAL
  Filled 2021-10-07: qty 1

## 2021-10-07 NOTE — Evaluation (Signed)
Physical Therapy Evaluation Patient Details Name: Mark Thomas MRN: [id number] DOB: 1950/12/06 Today's Date: 10/07/2021  History of Present Illness  71 y.o. male who presented to outside ED 10/04/21 with worsening shortness of breath and cough. recurrent pna (has had 2 recent hospitalizations for pna); +afib with RVR; 6/7 bronchoscopy with lavage; PMH includes: anxiety, CAD, depression, HTN, MI, stroke, vascular dementia, CKD, afib (not on anticoagulation), and orthostatic hypotension.  Clinical Impression   Pt admitted secondary to problem above with deficits below. PTA patient was living in one level home with level entrance with his wife. He struggles with orthostatic hypotension and on his "bad days" he uses his wheelchair for locomotion. On "good days" he is able to walk with RW. He does not have home oxygen.  Pt currently requires min assist for transfer and only able to stand ~30 seconds while attempting to get standing BP (he had to sit prior to it completing, however BP dropped from seated 122/75 to 92/70). He reported dizziness as reason he needed to sit and sats on 2L dropped to 87% with moderate dyspnea. Anticipate patient will benefit from PT to address problems listed below.Will continue to follow acutely to maximize functional mobility independence and safety.          Recommendations for follow up therapy are one component of a multi-disciplinary discharge planning process, led by the attending physician.  Recommendations may be updated based on patient status, additional functional criteria and insurance authorization.  Follow Up Recommendations Home health PT    Assistance Recommended at Discharge Intermittent Supervision/Assistance  Patient can return home with the following  A little help with walking and/or transfers;Assistance with cooking/housework;Direct supervision/assist for medications management;Direct supervision/assist for financial management    Equipment  Recommendations None recommended by PT  Recommendations for Other Services  OT consult    Functional Status Assessment Patient has had a recent decline in their functional status and demonstrates the ability to make significant improvements in function in a reasonable and predictable amount of time.     Precautions / Restrictions Precautions Precautions: Fall;Other (comment) Precaution Comments: orhtostasis Restrictions Weight Bearing Restrictions: No      Mobility  Bed Mobility               General bed mobility comments: up in recliner on arrival    Transfers Overall transfer level: Needs assistance Equipment used: Rolling walker (2 wheels) Transfers: Sit to/from Stand Sit to Stand: Min assist           General transfer comment: vc for hand placement sit <>stand for safe use of RW;    Ambulation/Gait               General Gait Details: unable due to dizziness and feeling dyspneic  Stairs            Wheelchair Mobility    Modified Rankin (Stroke Patients Only)       Balance Overall balance assessment: Needs assistance Sitting-balance support: No upper extremity supported, Feet supported Sitting balance-Leahy Scale: Fair     Standing balance support: Bilateral upper extremity supported, During functional activity, Reliant on assistive device for balance Standing balance-Leahy Scale: Poor                               Pertinent Vitals/Pain Pain Assessment Pain Assessment: No/denies pain    Home Living Family/patient expects to be discharged to:: Private residence Living Arrangements: Spouse/significant other  Available Help at Discharge: Available 24 hours/day;Family Type of Home: House Home Access: Level entry       Home Layout: One level Home Equipment: Shower seat;Shower seat - built in;Hand held Museum/gallery curator (4 wheels);Toilet riser;Wheelchair - manual Additional Comments: added grab bar horziontal  the length of the bathroom - home health OT requested and pt had it installed. pt has Arts administrator    Prior Function Prior Level of Function : Needs assist;History of Falls (last six months)             Mobility Comments: rollator and WC at home due to feeling badly and low BP (on "bad" days uses wc) ADLs Comments: wife helps him into and out of shower     Hand Dominance   Dominant Hand: Right    Extremity/Trunk Assessment   Upper Extremity Assessment Upper Extremity Assessment: Defer to OT evaluation    Lower Extremity Assessment Lower Extremity Assessment: Generalized weakness    Cervical / Trunk Assessment Cervical / Trunk Assessment: Other exceptions Cervical / Trunk Exceptions: overweight  Communication   Communication: No difficulties  Cognition Arousal/Alertness: Awake/alert Behavior During Therapy: Anxious (short of breath) Overall Cognitive Status: Impaired/Different from baseline Area of Impairment: Orientation, Problem solving                 Orientation Level: Place (time NT)           Problem Solving: Slow processing, Difficulty sequencing, Requires verbal cues General Comments: able to recall he was in [facility] hospital after 3 minutes        General Comments General comments (skin integrity, edema, etc.): Wife present and reports pt is weaker and looks worse than a "bad day" at home. Normally is not on oxygen or this dyspneic.    Exercises     Assessment/Plan    PT Assessment Patient needs continued PT services  PT Problem List Decreased strength;Decreased activity tolerance;Decreased balance;Decreased mobility;Decreased cognition;Decreased knowledge of use of DME;Decreased safety awareness;Obesity       PT Treatment Interventions DME instruction;Gait training;Functional mobility training;Therapeutic activities;Therapeutic exercise;Balance training;Cognitive remediation;Patient/family education    PT Goals (Current goals  can be found in the Care Plan section)  Acute Rehab PT Goals Patient Stated Goal: breathe better and go home PT Goal Formulation: With patient/family Time For Goal Achievement: 10/21/21 Potential to Achieve Goals: Good    Frequency Min 3X/week     Co-evaluation               AM-PAC PT "6 Clicks" Mobility  Outcome Measure Help needed turning from your back to your side while in a flat bed without using bedrails?: A Little Help needed moving from lying on your back to sitting on the side of a flat bed without using bedrails?: A Little Help needed moving to and from a bed to a chair (including a wheelchair)?: A Little Help needed standing up from a chair using your arms (e.g., wheelchair or bedside chair)?: A Little Help needed to walk in hospital room?: Total Help needed climbing 3-5 steps with a railing? : Total 6 Click Score: 14    End of Session Equipment Utilized During Treatment: Gait belt;Oxygen Activity Tolerance: Patient limited by fatigue;Treatment limited secondary to medical complications (Comment) (dizziness, dyspnea) Patient left: in chair;with call bell/phone within reach;with family/visitor present Nurse Communication: Mobility status;Other (comment) (BP and sats dropped) PT Visit Diagnosis: History of falling (Z91.81);Difficulty in walking, not elsewhere classified (R26.2);Dizziness and giddiness (R42)    Time:  1219-7588 PT Time Calculation (min) (ACUTE ONLY): 28 min   Charges:   PT Evaluation $PT Eval Moderate Complexity: 1 Mod PT Treatments $Therapeutic Activity: 8-22 mins         Arby Barrette, PT Acute Rehabilitation Services  Office [phone]   Rexanne Mano 10/07/2021, 10:20 AM

## 2021-10-07 NOTE — Care Management Important Message (Signed)
Important Message  Patient Details  Name: Mark Thomas MRN: [id number] Date of Birth: 12-29-1950   Medicare Important Message Given:  Yes     [name] 10/07/2021, 4:47 PM

## 2021-10-07 NOTE — Progress Notes (Addendum)
PROGRESS NOTE        PATIENT DETAILS Name: Mark Thomas Age: 71 y.o. Sex: male Date of Birth: 1950/05/07 Admit Date: 10/03/2021 Admitting Physician Bonnell Public, MD [id number], Einar Pheasant, DO  Brief Summary: Patient is a 71 y.o.  male history of CAD-s/p PCI PAF-s/p watchman's device, CVA who has numerous hospitalizations for recurrent PNA-presenting with SOB-found to have hypoxia due to multifocal PNA and subsequently admitted to the hospitalist service.    Significant events: 4/05-4/10>> hospitalization for PNA 4/27-4/30>> hospitalization for sepsis/hypoxia due to PNA. 6/04>> admit to [facility]-due to pneumonia.  Significant studies: 4/28>> TTE: EF 29-92%, grade 2 diastolic dysfunction, RVSP 49.5, moderate aortic stenosis 6/04>> CXR: Worsening opacities in the left lung  Significant microbiology data: 6/04>> COVID/INFLUENZA PCR: Negative 6/04>> blood culture: Negative 6/07>> BAL culture: Negative so far. 6/07>> BAL AFB culture: Pending 6/07>> BAL fungal culture: Pending 6/07>> BAL cytology: Pending  Procedures: 6/07>> bronchoscopy  Consults: PCCM  Subjective: Feels short of breath-but appears comfortable.  Objective: Vitals: Blood pressure 122/75, pulse 96, temperature 98.5 F (36.9 C), temperature source Oral, resp. rate 19, height _0  (1.854 m), weight 117 kg, SpO2 91 %.   Exam: Gen Exam:Alert awake-not in any distress HEENT:atraumatic, normocephalic Chest: B/L clear to auscultation anteriorly CVS:S1S2 regular Abdomen:soft non tender, non distended Extremities:no edema Neurology: Non focal Skin: no rash   Pertinent Labs/Radiology:    Latest Ref Rng & Units 10/07/2021    4:49 AM 10/06/2021    2:52 AM 10/05/2021    8:05 AM  CBC  WBC 4.0 - 10.5 K/uL 9.1  13.0  11.8   Hemoglobin 13.0 - 17.0 g/dL 8.7  9.1  10.2   Hematocrit 39.0 - 52.0 % 27.1  29.0  31.1   Platelets 150 - 400 K/uL 175  204  190     Lab Results  Component Value  Date   NA 140 10/07/2021   K 4.5 10/07/2021   CL 109 10/07/2021   CO2 23 10/07/2021      Assessment/Plan: Acute hypoxic respiratory failure due to recurrent multifocal pneumonia: Hypoxia better-on room air earlier this morning-but still complains of SOB.  Awaiting BAL cultures-in the meantime-continue empiric antimicrobial therapy-but suspect we could discontinue vancomycin today and discontinue on cefepime.  Await further recommendations from PCCM.  Repeat CXR tomorrow.  History of recurrent pneumonia: Occurring since COVID-19 April 2021-apparently improved after course of antibiotics/steroids-s/p bronchoscopy on 6/7-see above.  History of interstitial lung disease: Suspected GERD contributing to his ILD.  Supportive care-on PPI.  SVT: Continue beta-blocker-evaluated by cardiology-not felt to have A-fib-continue beta-blocker.  No longer on amiodarone.  History of PAF-s/p watchman's device 03/2019 and RFA with isolation of all 4 pulmonary veins in 2021: Continue telemetry monitoring.  Per cardiology note-not felt to have A-fib.  No longer on anticoagulation with watchman's device in place.  History of CAD s/p multiple PCI's: Currently without any anginal symptoms.  Continue aspirin/Plavix/statin/beta-blocker.  History of ASD-s/p closure 01/22/2020  History of moderate aortic stenosis: Stable for outpatient follow-up with cardiology.  History of CVA: No focal deficits.  Continue antiplatelets-statin.  History of sick sinus syndrome-s/p PPM placement: Continue telemetry monitoring.  History of vascular dementia: Appears to be mild-continue Namenda//Seroquel.  Maintain delirium precautions.  History of anxiety/depression: Continue Lexapro  History of orthostatic hypotension: Continue midodrine  GERD: Continue PPI  Obesity: Estimated body mass  index is 34.03 kg/m as calculated from the following:   Height as of this encounter: _0  (1.854 m).   Weight as of this encounter: 117  kg.   Code status:   Code Status: Full Code   DVT Prophylaxis: enoxaparin (LOVENOX) injection 40 mg Start: 10/04/21 2200   Family Communication: Spouse-[phone]-updated over the phone   Disposition Plan: Status is: Inpatient Remains inpatient appropriate because: Recurrent PNA-awaiting BAL cultures-remains on empiric IV antibiotics.   Planned Discharge Destination:Home   Diet: Diet Order             Diet Heart Room service appropriate? Yes; Fluid consistency: Thin  Diet effective now                     Antimicrobial agents: Anti-infectives (From admission, onward)    Start     Dose/Rate Route Frequency Ordered Stop   10/05/21 1800  vancomycin (VANCOREADY) IVPB 1250 mg/250 mL        1,250 mg 166.7 mL/hr over 90 Minutes Intravenous Every 12 hours 10/05/21 0957     10/04/21 0615  Vancomycin (VANCOCIN) 1,500 mg in sodium chloride 0.9 % 500 mL IVPB        1,500 mg 250 mL/hr over 120 Minutes Intravenous  Once 10/04/21 0600 10/04/21 0926   10/04/21 0605  vancomycin (VANCOCIN) 1000 MG powder       Note to Pharmacy: April Holding C: cabinet override      10/04/21 0605 10/04/21 1814   10/04/21 0605  vancomycin (VANCOCIN) 500 MG powder       Note to Pharmacy: April Holding C: cabinet override      10/04/21 0605 10/04/21 1814   10/04/21 0400  vancomycin (VANCOREADY) IVPB 1500 mg/300 mL  Status:  Discontinued        1,500 mg 150 mL/hr over 120 Minutes Intravenous Every 12 hours 10/03/21 1357 10/05/21 0957   10/03/21 1445  vancomycin (VANCOCIN) IVPB 1000 mg/200 mL premix       See Hyperspace for full Linked Orders Report.   1,000 mg 200 mL/hr over 60 Minutes Intravenous  Once 10/03/21 1335 10/03/21 1938   10/03/21 1400  ceFEPIme (MAXIPIME) 2 g in sodium chloride 0.9 % 100 mL IVPB        2 g 200 mL/hr over 30 Minutes Intravenous Every 8 hours 10/03/21 1356     10/03/21 1345  vancomycin (VANCOCIN) IVPB 1000 mg/200 mL premix       See Hyperspace for full  Linked Orders Report.   1,000 mg 200 mL/hr over 60 Minutes Intravenous  Once 10/03/21 1335 10/03/21 1846   10/03/21 1330  vancomycin (VANCOCIN) IVPB 1000 mg/200 mL premix  Status:  Discontinued        1,000 mg 200 mL/hr over 60 Minutes Intravenous  Once 10/03/21 1321 10/03/21 1335   10/03/21 1330  ceFEPIme (MAXIPIME) 2 g in sodium chloride 0.9 % 100 mL IVPB  Status:  Discontinued        2 g 200 mL/hr over 30 Minutes Intravenous  Once 10/03/21 1321 10/03/21 1356   10/03/21 1330  azithromycin (ZITHROMAX) 500 mg in sodium chloride 0.9 % 250 mL IVPB        500 mg 250 mL/hr over 60 Minutes Intravenous  Once 10/03/21 1321 10/03/21 1710        MEDICATIONS: Scheduled Meds:  allopurinol  100 mg Oral Daily   aspirin EC  81 mg Oral q AM   atorvastatin  80 mg Oral  Daily   bacitracin   Topical BID   chlorhexidine  15 mL Mouth/Throat Once   chlorpheniramine-HYDROcodone  5 mL Oral Q12H   cholecalciferol  400 Units Oral Daily   clopidogrel  75 mg Oral Daily   enoxaparin (LOVENOX) injection  40 mg Subcutaneous Q24H   escitalopram  30 mg Oral Daily   fludrocortisone  0.1 mg Oral BID   flumazenil  0.5 mg Intravenous To OR   hydrocortisone cream   Topical TID   latanoprost  1 drop Both Eyes QHS   lidocaine  1 patch Transdermal Q24H   magnesium oxide  400 mg Oral Daily   memantine  10 mg Oral BID   metoprolol tartrate  12.5 mg Oral BID   midodrine  5 mg Oral TID WC   mometasone-formoterol  2 puff Inhalation BID   ondansetron (ZOFRAN) IV  4 mg Intravenous Once   pantoprazole  40 mg Oral QPM   potassium citrate  10 mEq Oral Daily   primidone  100 mg Oral BID   QUEtiapine  100 mg Oral QHS   ranolazine  1,000 mg Oral BID   rOPINIRole  4 mg Oral QHS   Continuous Infusions:  sodium chloride 10 mL/hr at 10/04/21 0539   ceFEPime (MAXIPIME) IV 2 g (10/07/21 0514)   vancomycin 1,250 mg (10/07/21 0546)   PRN Meds:.sodium chloride, acetaminophen, guaiFENesin, HYDROcodone-acetaminophen,  ipratropium-albuterol   I have personally reviewed following labs and imaging studies  LABORATORY DATA: CBC: Recent Labs  Lab 10/03/21 1505 10/04/21 1436 10/05/21 0805 10/06/21 0252 10/07/21 0449  WBC 13.3* 10.6* 11.8* 13.0* 9.1  NEUTROABS 10.7*  --   --   --   --   HGB 10.4* 9.4* 10.2* 9.1* 8.7*  HCT 30.8* 29.1* 31.1* 29.0* 27.1*  MCV 100.7* 102.5* 102.0* 105.5* 104.2*  PLT 209 188 190 204 175     Basic Metabolic Panel: Recent Labs  Lab 10/03/21 1505 10/04/21 1436 10/05/21 0805 10/06/21 0252 10/07/21 0449  NA 135 138 137 137 140  K 4.2 3.2* 3.8 4.0 4.5  CL 102 106 108 107 109  CO2 25 23 21* 19* 23  GLUCOSE 128* 113* 182* 232* 114*  BUN _0 26* 28*  CREATININE 1.01 0.99 0.82 1.10 0.94  CALCIUM 8.5* 8.3* 8.5* 8.7* 8.7*  MG  --   --  1.9 2.0 2.1     GFR: Estimated Creatinine Clearance: 100.7 mL/min (by C-G formula based on SCr of 0.94 mg/dL).  Liver Function Tests: Recent Labs  Lab 10/03/21 1505  AST 26  ALT 20  ALKPHOS 56  BILITOT 1.0  PROT 7.1  ALBUMIN 2.8*    No results for input(s): "LIPASE", "AMYLASE" in the last 168 hours. No results for input(s): "AMMONIA" in the last 168 hours.  Coagulation Profile: Recent Labs  Lab 10/03/21 1505  INR 1.2     Cardiac Enzymes: No results for input(s): "CKTOTAL", "CKMB", "CKMBINDEX", "TROPONINI" in the last 168 hours.  BNP (last 3 results) Recent Labs    03/18/21 1004  PROBNP 370     Lipid Profile: No results for input(s): "CHOL", "HDL", "LDLCALC", "TRIG", "CHOLHDL", "LDLDIRECT" in the last 72 hours.  Thyroid Function Tests: No results for input(s): "TSH", "T4TOTAL", "FREET4", "T3FREE", "THYROIDAB" in the last 72 hours.  Anemia Panel: No results for input(s): "VITAMINB12", "FOLATE", "FERRITIN", "TIBC", "IRON", "RETICCTPCT" in the last 72 hours.  Urine analysis:    Component Value Date/Time   COLORURINE YELLOW 10/03/2021 2159   APPEARANCEUR CLEAR  10/03/2021 2159   LABSPEC 1.010  10/03/2021 2159   PHURINE 7.0 10/03/2021 2159   GLUCOSEU NEGATIVE 10/03/2021 2159   HGBUR NEGATIVE 10/03/2021 2159   BILIRUBINUR NEGATIVE 10/03/2021 2159   [place] NEGATIVE 10/03/2021 2159   PROTEINUR NEGATIVE 10/03/2021 2159   NITRITE NEGATIVE 10/03/2021 2159   LEUKOCYTESUR NEGATIVE 10/03/2021 2159    Sepsis Labs: Lactic Acid, Venous    Component Value Date/Time   LATICACIDVEN 1.3 10/03/2021 1505    MICROBIOLOGY: Recent Results (from the past 240 hour(s))  Culture, blood (Routine x 2)     Status: None (Preliminary result)   Collection Time: 10/03/21  3:00 PM   Specimen: BLOOD LEFT ARM  Result Value Ref Range Status   Specimen Description   Final    BLOOD LEFT ARM BLOOD Performed at [facility], [address] [place], [place] [address]    Special Requests   Final    Blood Culture adequate volume BOTTLES DRAWN AEROBIC AND ANAEROBIC Performed at [facility], [address] [place], [place] [address]    Culture   Final    NO GROWTH 3 DAYS Performed at [facility], [place] [address] [place], [place] [address]    Report Status PENDING  Incomplete  Resp Panel by RT-PCR (Flu A&B, Covid) Anterior Nasal Swab     Status: None   Collection Time: 10/03/21  3:05 PM   Specimen: Anterior Nasal Swab  Result Value Ref Range Status   SARS Coronavirus 2 by RT PCR NEGATIVE NEGATIVE Final    Comment: (NOTE) SARS-CoV-2 target nucleic acids are NOT DETECTED.  The SARS-CoV-2 RNA is generally detectable in upper respiratory specimens during the acute phase of infection. The lowest concentration of SARS-CoV-2 viral copies this assay can detect is 138 copies/mL. A negative result does not preclude SARS-Cov-2 infection and should not be used as the sole basis for treatment or other patient management decisions. A negative result may occur with  improper specimen collection/handling, submission of specimen other than nasopharyngeal swab, presence of viral  mutation(s) within the areas targeted by this assay, and inadequate number of viral copies(<138 copies/mL). A negative result must be combined with clinical observations, patient history, and epidemiological information. The expected result is Negative.  Fact Sheet for Patients:  EntrepreneurPulse.com.au  Fact Sheet for Healthcare Providers:  IncredibleEmployment.be  This test is no t yet approved or cleared by the [place] FDA and  has been authorized for detection and/or diagnosis of SARS-CoV-2 by FDA under an Emergency Use Authorization (EUA). This EUA will remain  in effect (meaning this test can be used) for the duration of the COVID-19 declaration under Section 564(b)(1) of the Act, 21 U.S.C.section 360bbb-3(b)(1), unless the authorization is terminated  or revoked sooner.       Influenza A by PCR NEGATIVE NEGATIVE Final   Influenza B by PCR NEGATIVE NEGATIVE Final    Comment: (NOTE) The Xpert Xpress SARS-CoV-2/FLU/RSV plus assay is intended as an aid in the diagnosis of influenza from Nasopharyngeal swab specimens and should not be used as a sole basis for treatment. Nasal washings and aspirates are unacceptable for Xpert Xpress SARS-CoV-2/FLU/RSV testing.  Fact Sheet for Patients: EntrepreneurPulse.com.au  Fact Sheet for Healthcare Providers: IncredibleEmployment.be  This test is not yet approved or cleared by the [place] FDA and has been authorized for detection and/or diagnosis of SARS-CoV-2 by FDA under an Emergency Use Authorization (EUA). This EUA will remain in effect (meaning this test can  be used) for the duration of the COVID-19 declaration under Section 564(b)(1) of the Act, 21 U.S.C. section 360bbb-3(b)(1), unless the authorization is terminated or revoked.  Performed at [facility], [address] [place], [place] [address]   Culture, blood (Routine x 2)      Status: None (Preliminary result)   Collection Time: 10/04/21  2:37 PM   Specimen: BLOOD LEFT HAND  Result Value Ref Range Status   Specimen Description BLOOD LEFT HAND  Final   Special Requests   Final    BOTTLES DRAWN AEROBIC AND ANAEROBIC Blood Culture adequate volume   Culture   Final    NO GROWTH 2 DAYS Performed at [facility], [place] [address], [place], [place] [address]    Report Status PENDING  Incomplete  Culture, BAL-quantitative w Gram Stain     Status: None (Preliminary result)   Collection Time: 10/06/21 12:42 PM   Specimen: Bronchoalveolar Lavage  Result Value Ref Range Status   Specimen Description BRONCHIAL ALVEOLAR LAVAGE  Final   Special Requests LINGULA  Final   Gram Stain   Final    RARE WBC PRESENT,BOTH PMN AND MONONUCLEAR NO ORGANISMS SEEN    Culture   Final    NO GROWTH < 24 HOURS Performed at [facility], [place] [address], [place], [place] [address]    Report Status PENDING  Incomplete    RADIOLOGY STUDIES/RESULTS: No results found.   LOS: 3 days   Oren Binet, MD  [facility]    To contact the attending provider between 7A-7P or the covering provider during after hours 7P-7A, please log into the web site [email or web address] and access using universal [place] password for that web site. If you do not have the password, please call the hospital operator.  10/07/2021, 11:52 AM

## 2021-10-07 NOTE — Evaluation (Signed)
Clinical/Bedside Swallow Evaluation Patient Details  Name: Mark Thomas MRN: [id number] Date of Birth: 02-Aug-1950  Today's Date: 10/07/2021 Time: SLP Start Time (ACUTE ONLY): 38 SLP Stop Time (ACUTE ONLY): 1115 SLP Time Calculation (min) (ACUTE ONLY): 25 min  Past Medical History:  Past Medical History:  Diagnosis Date   Anxiety    Coronary artery disease    Depression    Essential tremor    GERD (gastroesophageal reflux disease)    High cholesterol    Hypertension    Myocardial infarct (HCC)    Orthostatic hypotension    Stroke ([place])    Vascular dementia ([place])    Past Surgical History:  Past Surgical History:  Procedure Laterality Date   [place] N/A 10/06/2021   Procedure: FLEXIBLE BRONCHOSCOPY;  Surgeon: Margaretha Seeds, MD;  Location: [place];  Service: Cardiopulmonary;  Laterality: N/A;   HERNIA REPAIR     NASAL SINUS SURGERY     SHOULDER SURGERY     HPI:  Patient is a 71 y.o. male with PMH: vascular dementia, CAD s/p stent, CVA, CKD 3, HTN, paroxysmal atrial fibrillation, dysphagia. He presented to the ED on 10/04/21 with worsening SOB and cough. He has been having progressive SOB since having Covid infection 04/2021 and has been followed by pulmonology on an OP basis with some improvement in cough. Pulmonology suspect that his symptoms are multi-factorial with ongoing GERD, mild interstitial lung disease pattern seen on CXR. He had an MBS 08/27/21 which did not show any penetration or aspiration of any of the tested consistencies (thin liquid, puree solids, 60m barium tablet,, regular solids). CXR completed on 10/03/21 showed Increased heterogeneous opacities in the left lung, now involving the left upper lung, concerning for worsening pneumonia. Bronchoscopy completed on 10/07/21 which reported that examination of the airway was normal.    Assessment / Plan / Recommendation  Clinical Impression  Patient  is currently presenting with what is likely esophageal dysphagia secondary to his h/o GERD. Pharyngeal phase dysphagia cannot be r/o however patient had recent MBS (08/27/21) which reported that patients oral and pharyngeal phase of swallow were [facility] [place] penetration or aspiration observed. During today's evaluation, SLP observed patient with sips of thin liquids via straw and although he did exhibit mildly delayed cough, this was not consistent and did not persist. Patient's spouse reports that he occasionally has an incident where he seems to be choking with incident occuring here in hospital two days ago when he choked on some chicken. She described an incident at home where he got "blue in the face" when trying to swallow a large amount of pills at once. (she now gives him 2-3 at a time) SLP discussed small modifications to try that could help with swallow safety such as: drinking water prior to pills, trying gelatin prior to pill intake and/or taking pills in applesauce, avoiding foods that are very dense, dry, hard to chew, and drinking fluids throughout the day to aid in clearing phlegm/mucous. Spouse verbalized and demonstrated understanding and SLP not recommending any further treatment or testing at this time in relation to patient's swallow function. SLP Visit Diagnosis: Dysphagia, unspecified (R13.10)    Aspiration Risk  No limitations;Mild aspiration risk    Diet Recommendation Regular;Thin liquid   Liquid Administration via: Cup;Straw Medication Administration: Whole meds with liquid Supervision: Patient able to self feed;Full supervision/cueing for compensatory strategies Compensations: Slow rate;Small  sips/bites;Minimize environmental distractions Postural Changes: Seated upright at 90 degrees;Remain upright for at least 30 minutes after po intake    Other  Recommendations Oral Care Recommendations: Oral care BID    Recommendations for follow up therapy are one component of a  multi-disciplinary discharge planning process, led by the attending physician.  Recommendations may be updated based on patient status, additional functional criteria and insurance authorization.  Follow up Recommendations No SLP follow up      Assistance Recommended at Discharge None  Functional Status Assessment Patient has had a recent decline in their functional status and demonstrates the ability to make significant improvements in function in a reasonable and predictable amount of time.  Frequency and Duration            Prognosis Prognosis for Safe Diet Advancement: Good      Swallow Study   General Date of Onset: 10/05/21 HPI: Patient is a 71 y.o. male with PMH: vascular dementia, CAD s/p stent, CVA, CKD 3, HTN, paroxysmal atrial fibrillation, dysphagia. He presented to the ED on 10/04/21 with worsening SOB and cough. He has been having progressive SOB since having Covid infection 04/2021 and has been followed by pulmonology on an OP basis with some improvement in cough. Pulmonology suspect that his symptoms are multi-factorial with ongoing GERD, mild interstitial lung disease pattern seen on CXR. He had an MBS 08/27/21 which did not show any penetration or aspiration of any of the tested consistencies (thin liquid, puree solids, 56m barium tablet,, regular solids). CXR completed on 10/03/21 showed Increased heterogeneous opacities in the left lung, now involving the left upper lung, concerning for worsening pneumonia. Bronchoscopy completed on 10/07/21 which reported that examination of the airway was normal. Type of Study: Bedside Swallow Evaluation Previous Swallow Assessment: MBS during previous admission 08/27/21: basically WFL Diet Prior to this Study: Regular;Thin liquids Temperature Spikes Noted: No Respiratory Status: Nasal cannula History of Recent Intubation: Yes Length of Intubations (days):  (for procedure only) Date extubated:  (10/06/21 after procedure) Behavior/Cognition:  Alert;Cooperative;Pleasant mood;Confused Oral Cavity Assessment: Within Functional Limits Oral Care Completed by SLP: No Oral Cavity - Dentition: Adequate natural dentition Vision: Functional for self-feeding Self-Feeding Abilities: Able to feed self Patient Positioning: Upright in chair Baseline Vocal Quality: Normal Volitional Cough: Strong Volitional Swallow: Able to elicit    Oral/Motor/Sensory Function Overall Oral Motor/Sensory Function: Within functional limits   Ice Chips     Thin Liquid Thin Liquid: Impaired Presentation: Straw;Self Fed Pharyngeal  Phase Impairments: Cough - Delayed    Nectar Thick     Honey Thick     Puree Puree: Not tested   Solid     Solid: Not tested     JSonia Baller MA, CCC-SLP Speech Therapy

## 2021-10-07 NOTE — Progress Notes (Signed)
NAME:  Mark Thomas, MRN:  [id number], DOB:  20-Oct-1950, LOS: 3 ADMISSION DATE:  10/03/2021, CONSULTATION DATE: 10/05/2021 REFERRING MD: [facility], CHIEF COMPLAINT: Refractory pneumonia  History of Present Illness:  71 year old male who is a never smoker with history of hypertension coronary disease with a stent along with CVA, gastroesophageal reflux disease, chronic kidney disease with current creatinine .90.  He has a history of atrial fibrillation followed by cardiology along with a permanent pacemaker and currently being paced.  He reports recently being treated for pneumonia with antibiotics with some improvement but signs and symptoms of infection have returned he noticed increasing orthopnea and generalized failure to thrive with episode of hemoptysis without fevers chills or sweats.  He did had COVID in 12/22 and has had problems with respiratory difficulty since.  He has been followed in the office by Dr. Erin Fulling and pulmonary critical care has been asked to evaluate while in the hospital due to his ongoing symptoms and been refractory to outpatient treatment.  Pertinent  Medical History   Past Medical History:  Diagnosis Date   Anxiety    Coronary artery disease    Depression    Essential tremor    GERD (gastroesophageal reflux disease)    High cholesterol    Hypertension    Myocardial infarct (HCC)    Orthostatic hypotension    Stroke [facility])    Vascular dementia ([place])      Significant Hospital Events: Including procedures, antibiotic start and stop dates in addition to other pertinent events   6/6-pulmonary consult 6/7-bronchoscopy   Interim History / Subjective:  Refractory pneumonia Interstitial lung disease Hemoptysis-no longer present  Still having some difficulty with his breathing  Objective   Blood pressure (!) 142/112, pulse 91, temperature 98.7 F (37.1 C), temperature source Oral, resp. rate 20, height _0  (1.854 m), weight 117 kg, SpO2 92 %.         Intake/Output Summary (Last 24 hours) at 10/07/2021 0900 Last data filed at 10/06/2021 1734 Gross per 24 hour  Intake 898.9 ml  Output --  Net 898.9 ml   Filed Weights   10/03/21 1229 10/06/21 0616 10/06/21 1154  Weight: 117.9 kg 117 kg 117 kg    Examination: General: Elderly, does not appear to be in distress HENT: Moist oral mucosa Lungs: Bibasal rales Cardiovascular: S1-S2 appreciated Abdomen: Soft, bowel sounds appreciated Extremities: No clubbing, no edema Neuro: Alert and oriented x3 GU: Fair output  Resolved Hospital Problem list     Assessment & Plan:  Recurrent pneumonia with hypoxemia -Today will be day 5 of antibiotics -Leukocytosis seems to be getting better -If no significant organism from BAL -Antibiotics can be stopped today if no organisms on culture  Hemoptysis -No significant worsening of hemoptysis -Continue anticoagulation  Interstitial lung disease  Post bronchoscopy -Cultures negative at present -No organisms from BAL  Paroxysmal atrial fibrillation Coronary artery disease History of MI  Class II obesity   Best Practice (right click and "Reselect all SmartList Selections" daily)   Diet/type: Regular consistency (see orders) DVT prophylaxis: LMWH GI prophylaxis: PPI Lines: N/A Foley:  N/A Code Status:  full code Last date of multidisciplinary goals of care discussion [per primary team]  Labs   CBC: Recent Labs  Lab 10/03/21 1505 10/04/21 1436 10/05/21 0805 10/06/21 0252 10/07/21 0449  WBC 13.3* 10.6* 11.8* 13.0* 9.1  NEUTROABS 10.7*  --   --   --   --   HGB 10.4* 9.4* 10.2* 9.1* 8.7*  HCT  30.8* 29.1* 31.1* 29.0* 27.1*  MCV 100.7* 102.5* 102.0* 105.5* 104.2*  PLT 209 188 190 204 326    Basic Metabolic Panel: Recent Labs  Lab 10/03/21 1505 10/04/21 1436 10/05/21 0805 10/06/21 0252 10/07/21 0449  NA 135 138 137 137 140  K 4.2 3.2* 3.8 4.0 4.5  CL 102 106 108 107 109  CO2 25 23 21* 19* 23  GLUCOSE 128* 113* 182*  232* 114*  BUN _0 26* 28*  CREATININE 1.01 0.99 0.82 1.10 0.94  CALCIUM 8.5* 8.3* 8.5* 8.7* 8.7*  MG  --   --  1.9 2.0 2.1   GFR: Estimated Creatinine Clearance: 100.7 mL/min (by C-G formula based on SCr of 0.94 mg/dL). Recent Labs  Lab 10/03/21 1505 10/04/21 1436 10/05/21 0805 10/06/21 0252 10/07/21 0449  WBC 13.3* 10.6* 11.8* 13.0* 9.1  LATICACIDVEN 1.3  --   --   --   --     Liver Function Tests: Recent Labs  Lab 10/03/21 1505  AST 26  ALT 20  ALKPHOS 56  BILITOT 1.0  PROT 7.1  ALBUMIN 2.8*   No results for input(s): "LIPASE", "AMYLASE" in the last 168 hours. No results for input(s): "AMMONIA" in the last 168 hours.  ABG    Component Value Date/Time   TCO2 29 08/04/2021 1535     Coagulation Profile: Recent Labs  Lab 10/03/21 1505  INR 1.2    Cardiac Enzymes: No results for input(s): "CKTOTAL", "CKMB", "CKMBINDEX", "TROPONINI" in the last 168 hours.  HbA1C: Hgb A1c MFr Bld  Date/Time Value Ref Range Status  01/08/2021 01:38 AM 6.0 (H) 4.8 - 5.6 % Final    Comment:    (NOTE) Pre diabetes:          5.7%-6.4%  Diabetes:              >6.4%  Glycemic control for   <7.0% adults with diabetes     CBG: No results for input(s): "GLUCAP" in the last 168 hours.  Review of Systems:   Some shortness of breath  Past Medical History:  He,  has a past medical history of Anxiety, Coronary artery disease, Depression, Essential tremor, GERD (gastroesophageal reflux disease), High cholesterol, Hypertension, Myocardial infarct ([place]), Orthostatic hypotension, Stroke ([place]), and Vascular dementia ([place]).   Surgical History:   Past Surgical History:  Procedure Laterality Date   APPENDECTOMY     CARDIAC SURGERY     CHOLECYSTECTOMY     FLEXIBLE BRONCHOSCOPY N/A 10/06/2021   Procedure: FLEXIBLE BRONCHOSCOPY;  Surgeon: Margaretha Seeds, MD;  Location: [place];  Service: Cardiopulmonary;  Laterality: N/A;   HERNIA REPAIR     NASAL SINUS SURGERY     SHOULDER  SURGERY       Social History:   reports that he has never smoked. He has never been exposed to tobacco smoke. He has never used smokeless tobacco. He reports that he does not currently use alcohol. He reports that he does not use drugs.   Family History:  His family history includes Hypertension in his father and mother; Stroke in his father and mother.   Allergies Allergies  Allergen Reactions   Dilaudid [Hydromorphone] Other (See Comments)    Respiratory Arrest!!!!   Bactrim [Sulfamethoxazole-Trimethoprim] Rash   Benadryl [Diphenhydramine] Other (See Comments)    Muscle spasms   Phenobarbital Other (See Comments)    From childhood- reaction not recalled at this time   Penicillins Other (See Comments)    From childhood- reaction not  recalled at this time     Sherrilyn Rist, MD [facility] Pager: See Shea Evans

## 2021-10-08 ENCOUNTER — Inpatient Hospital Stay (HOSPITAL_COMMUNITY): Payer: Medicare Other

## 2021-10-08 DIAGNOSIS — I2510 Atherosclerotic heart disease of native coronary artery without angina pectoris: Secondary | ICD-10-CM | POA: Diagnosis not present

## 2021-10-08 DIAGNOSIS — Z8673 Personal history of transient ischemic attack (TIA), and cerebral infarction without residual deficits: Secondary | ICD-10-CM | POA: Diagnosis not present

## 2021-10-08 DIAGNOSIS — J9601 Acute respiratory failure with hypoxia: Secondary | ICD-10-CM | POA: Diagnosis not present

## 2021-10-08 DIAGNOSIS — J189 Pneumonia, unspecified organism: Secondary | ICD-10-CM | POA: Diagnosis not present

## 2021-10-08 LAB — URINALYSIS, COMPLETE (UACMP) WITH MICROSCOPIC
Bacteria, UA: NONE SEEN
Bilirubin Urine: NEGATIVE
Glucose, UA: NEGATIVE mg/dL
Hgb urine dipstick: NEGATIVE
Ketones, ur: NEGATIVE mg/dL
Leukocytes,Ua: NEGATIVE
Nitrite: NEGATIVE
Protein, ur: 30 mg/dL — AB
Specific Gravity, Urine: 1.021 (ref 1.005–1.030)
pH: 5 (ref 5.0–8.0)

## 2021-10-08 LAB — BASIC METABOLIC PANEL
Anion gap: 6 (ref 5–15)
BUN: 19 mg/dL (ref 8–23)
CO2: 22 mmol/L (ref 22–32)
Calcium: 8.1 mg/dL — ABNORMAL LOW (ref 8.9–10.3)
Chloride: 111 mmol/L (ref 98–111)
Creatinine, Ser: 0.77 mg/dL (ref 0.61–1.24)
GFR, Estimated: >60 mL/min (ref >60)
Glucose, Bld: 104 mg/dL — ABNORMAL HIGH (ref 70–99)
Potassium: 4.1 mmol/L (ref 3.5–5.1)
Sodium: 139 mmol/L (ref 135–145)

## 2021-10-08 LAB — CULTURE, BLOOD (ROUTINE X 2)
Culture: NO GROWTH
Special Requests: ADEQUATE

## 2021-10-08 LAB — CULTURE, BAL-QUANTITATIVE W GRAM STAIN: Culture: NO GROWTH

## 2021-10-08 LAB — C-REACTIVE PROTEIN: CRP: 20.6 mg/dL — ABNORMAL HIGH (ref <1.0)

## 2021-10-08 IMAGING — DX DG CHEST 1V PORT
1 series · 1 of 1 positions shown · non-contrast
Comparison: Radiograph [DATE]

CLINICAL DATA: Shortness of breath.

EXAM:
PORTABLE CHEST 1 VIEW

[chest ap]
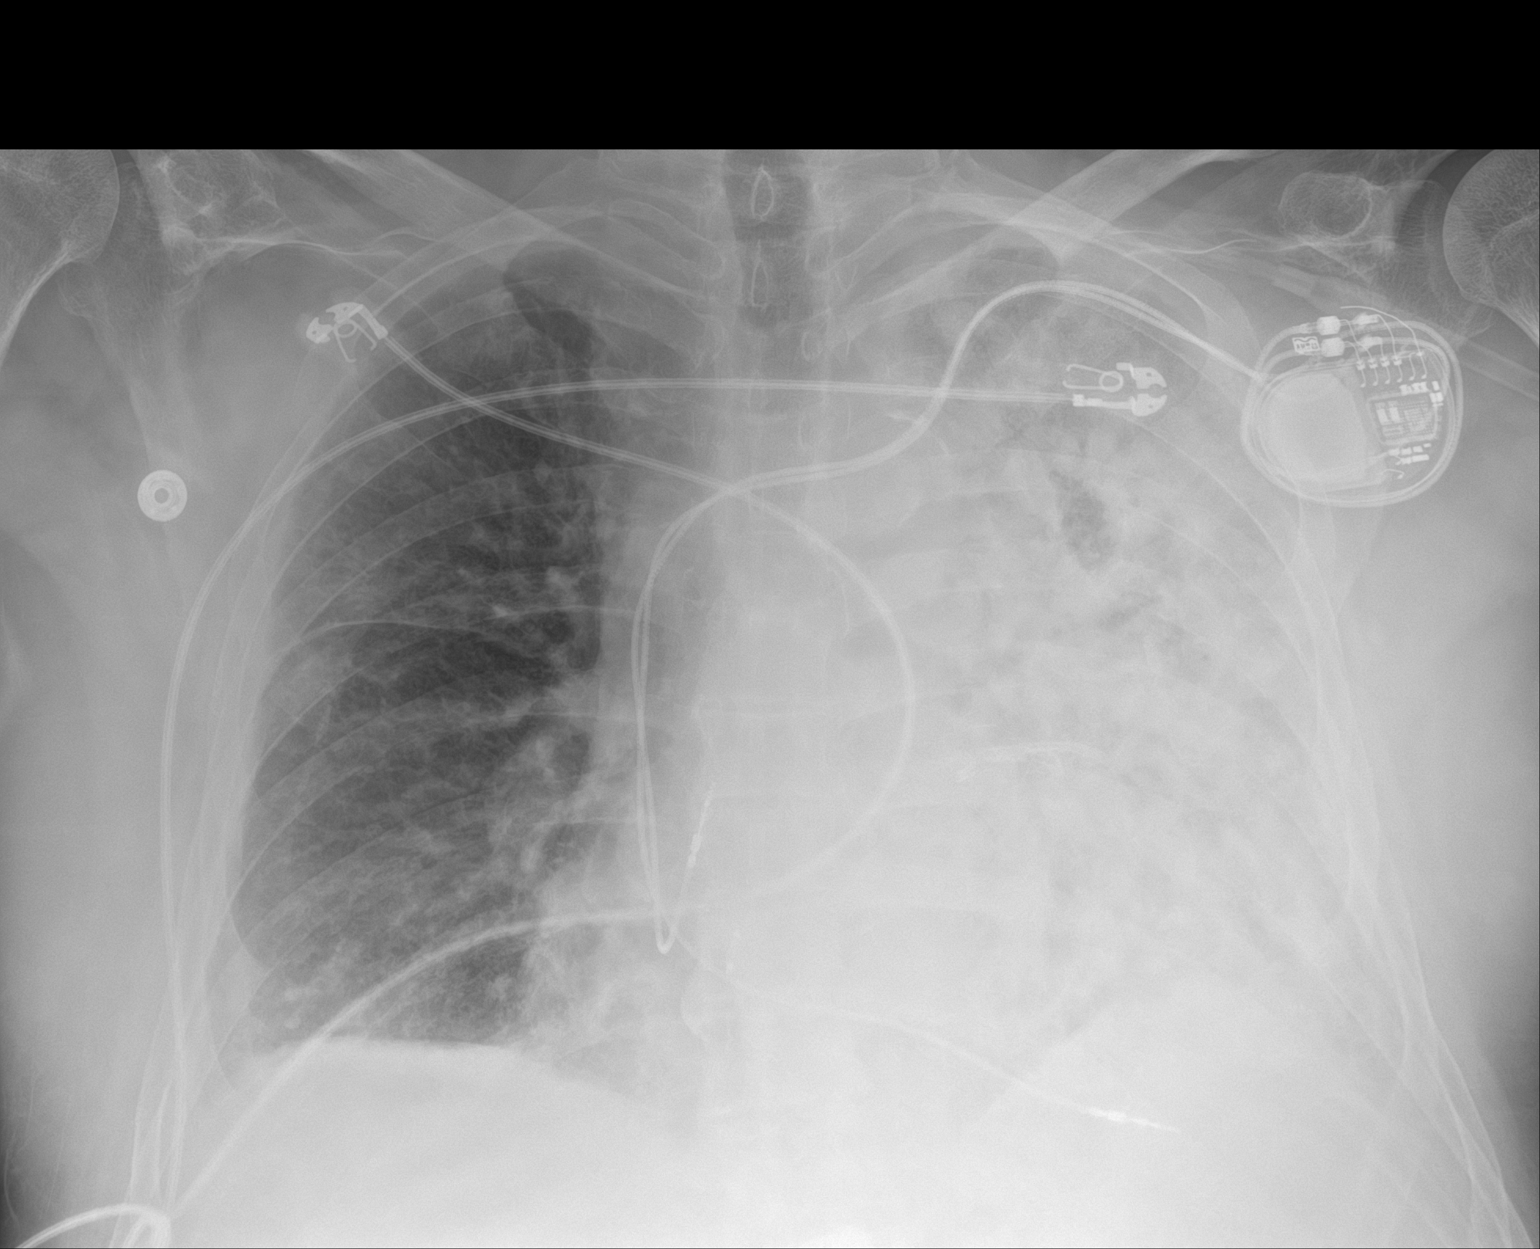

[1 of 1 positions shown; findings below may reference images not displayed]

FINDINGS: Left chest pacemaker with leads overlying the right atrium right
ventricle. The heart size and mediastinal contours are obscured.

Significant increase in the left lung opacities now with near
complete opacification. Mild right-sided interstitial thickening.

No acute osseous abnormality.
IMPRESSION: Significant interval increase in the left lung opacities now with
near complete opacification concern for worsening pneumonia.

Mild right-sided interstitial thickening.

## 2021-10-08 IMAGING — CT CT CHEST W/O CM
2 of 4 series · 14 of 36 positions shown, 17 images · non-contrast
Comparison: [DATE]

CLINICAL DATA: Evaluate pneumonia.



[Series 4: thorax 2.0 · axial · 0.86mm/px · z∈[+1232,+1526]mm · 11 of 165 slices shown, 14 images]
[im 9/165  mediastinal]
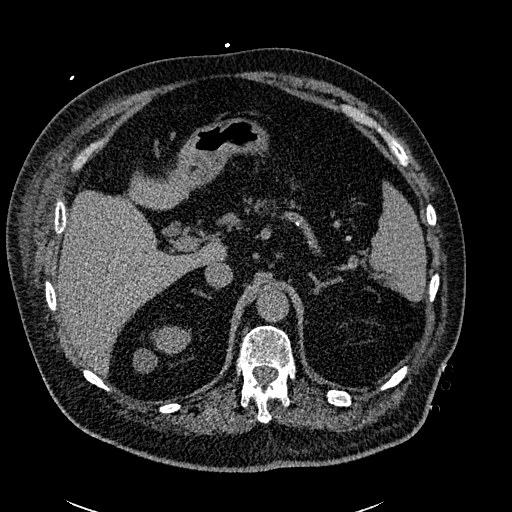
[im 9/165  lung]
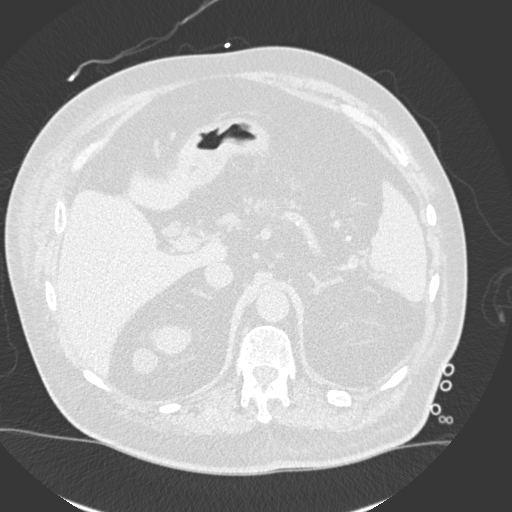
[im 26/165  lung]
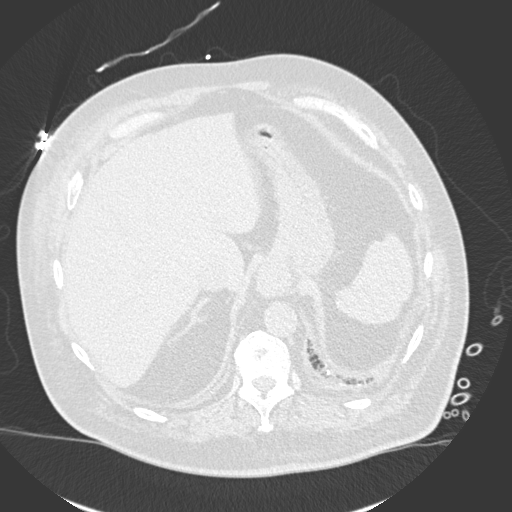
[im 44/165  lung]
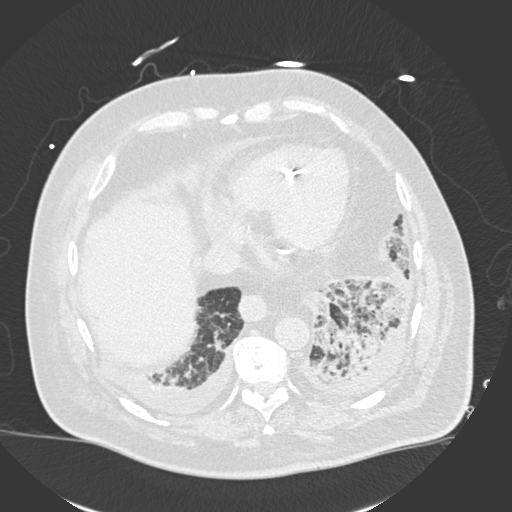
[im 52/165  lung]
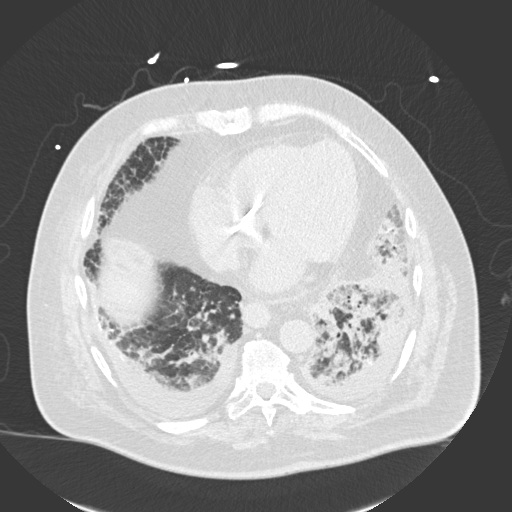
[im 70/165  mediastinal]
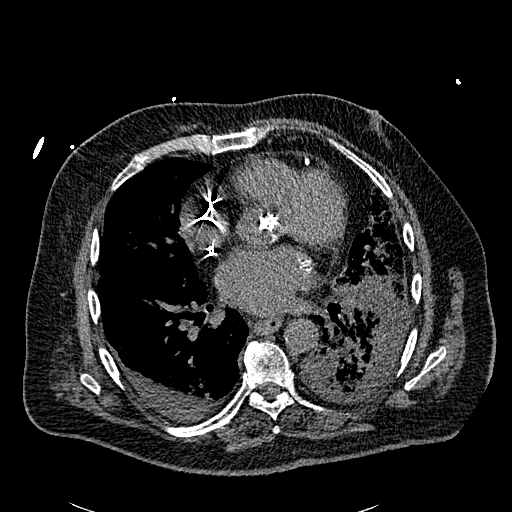
[im 70/165  lung]
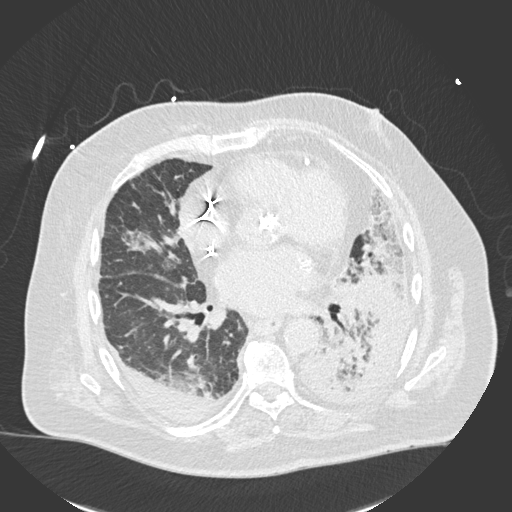
[im 87/165  lung]
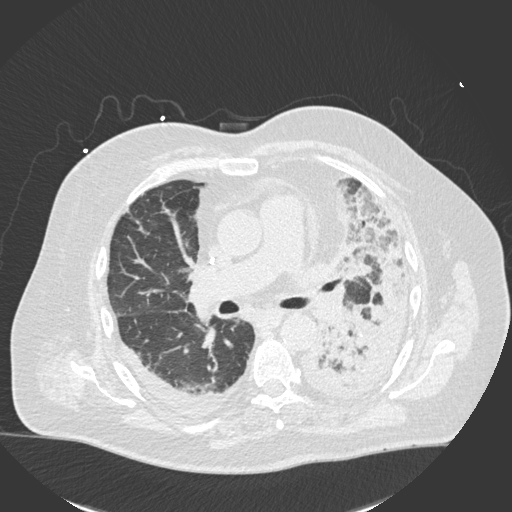
[im 95/165  lung]
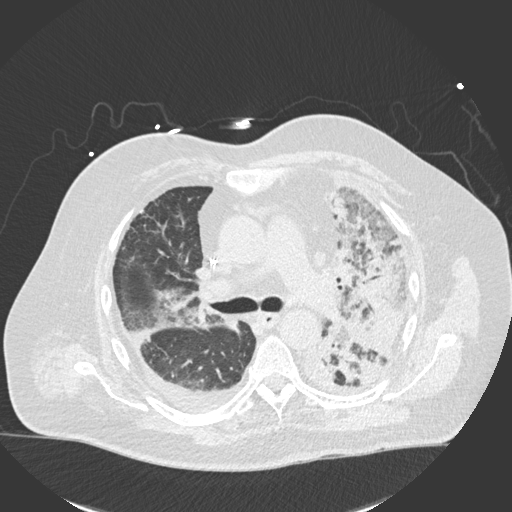
[im 113/165  lung]
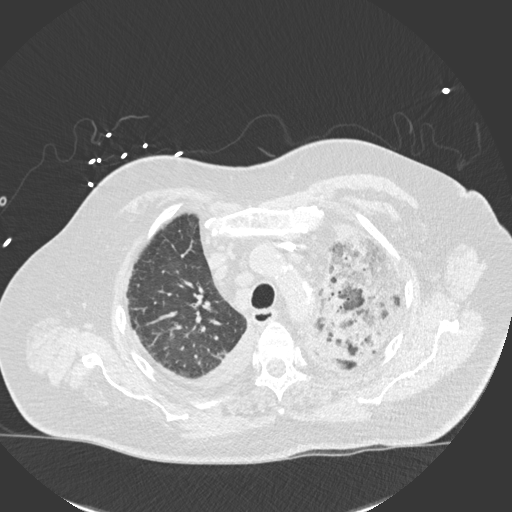
[im 121/165  mediastinal]
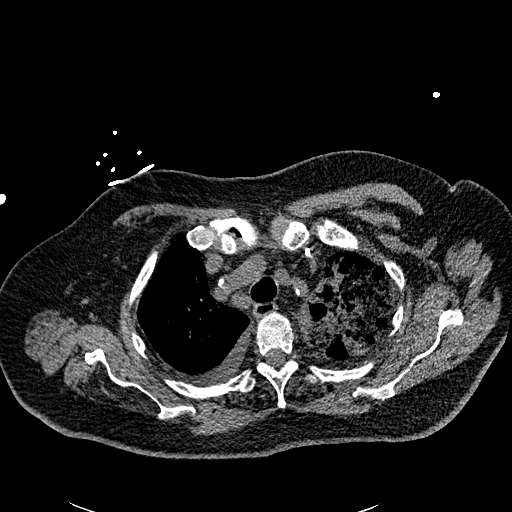
[im 121/165  lung]
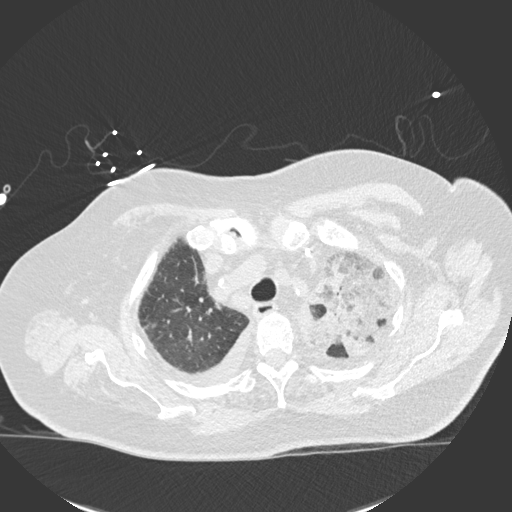
[im 139/165  lung]
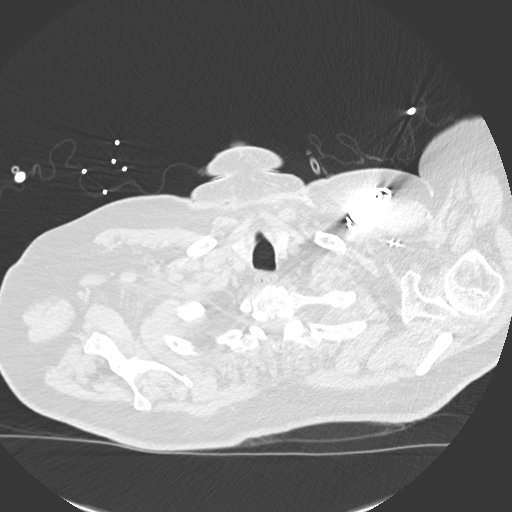
[im 156/165  lung]
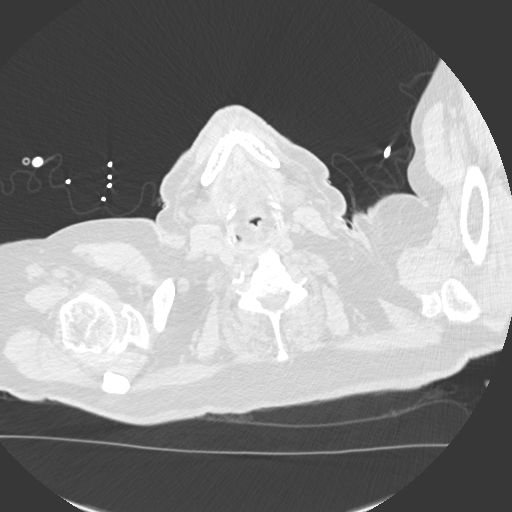

[Series 6: coronal · coronal · 0.64mm/px · 3 of 123 slices shown]
[im 25/123  lung]
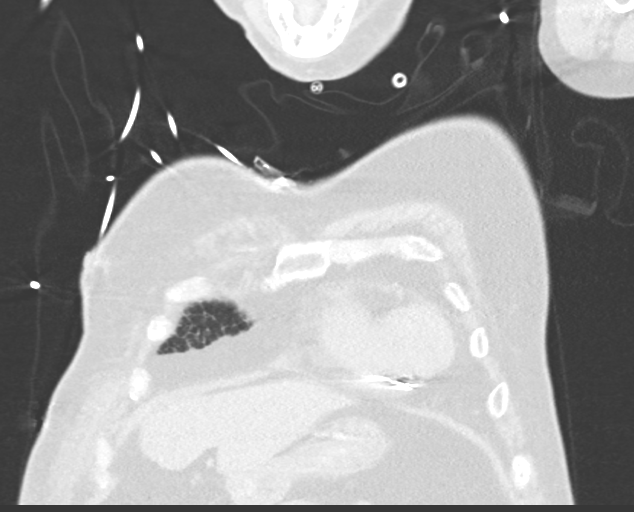
[im 49/123  lung]
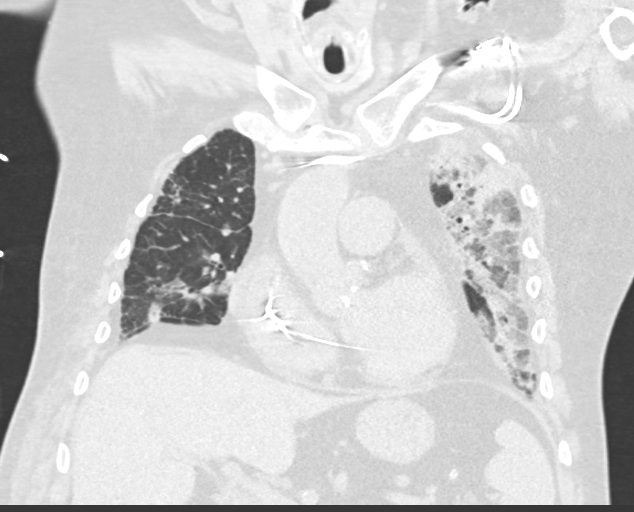
[im 74/123  lung]
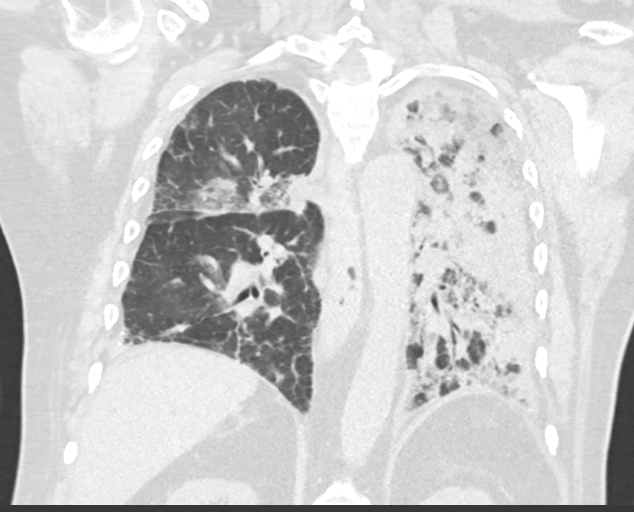

[14 of 36 positions shown; findings below may reference images not displayed]

FINDINGS: Cardiovascular: Mild cardiac enlargement. No pericardial effusion.
Aortic atherosclerosis and multi vessel coronary artery
calcification. Left chest wall pacer is noted with leads in the
right atrial appendage and right ventricle.

Mediastinum/Nodes: Thyroid gland and trachea appear unremarkable.
Mild circumferential wall thickening of the distal esophagus is
noted. Small to moderate hiatal hernia noted.

Multiple prominent (non pathologically enlarged) mediastinal lymph
nodes are identified and appear increased in size from previous
exam. For example, index right paratracheal lymph node measures
cm, image [DATE]. Previously 0.9 cm. Left paratracheal lymph node
measures 1.1 cm, image [DATE]. Previously 0.6 cm.

Lungs/Pleura: Small bilateral pleural effusions are identified, new
from previous exam. There is a background interstitial abnormality
with lower lung zone predominant interstitial reticulation and
septal thickening. Extensive airspace and ground-glass disease is
identified throughout the left upper and lower lobes which is
concerning for multifocal infection. Posterior and medial right
upper lobe ground-glass and airspace opacification is also
identified and concerning for inflammation/infection. Patchy
ground-glass attenuation is noted within the right middle lobe.
Within the right lower lobe there is increased soft tissue
thickening of the peribronchovascular interstitium.

Upper Abdomen: No acute abnormality. Benign Bosniak class 1 cyst
arises off the upper pole of the right kidney measuring 2 cm. No
follow-up recommended.

Musculoskeletal: No chest wall mass or suspicious bone lesions
identified.
IMPRESSION: 1. Extensive airspace and ground-glass disease identified throughout
the left upper and lower lobes as well as posterior and medial right
upper lobe. Findings are concerning for multifocal infection.
2. Small bilateral pleural effusions, new from previous exam.
3. Small to moderate hiatal hernia with mild circumferential wall
thickening of the distal esophagus. Correlate for any clinical signs
or symptoms of esophagitis.
4. Increased size of mediastinal lymph nodes which is likely
reactive in the setting of multifocal pneumonia.
5. Coronary artery calcifications.
6. Aortic Atherosclerosis ([1W]-[1W]).

## 2021-10-08 MED ORDER — OXYMETAZOLINE HCL 0.05 % NA SOLN
3.0000 | Freq: Two times a day (BID) | NASAL | Status: AC
Start: 2021-10-08 — End: 2021-10-11
  Administered 2021-10-09 – 2021-10-10 (×4): 3 via NASAL
  Filled 2021-10-08 (×2): qty 30

## 2021-10-08 MED ORDER — OXYCODONE-ACETAMINOPHEN 5-325 MG PO TABS
1.0000 | ORAL_TABLET | Freq: Three times a day (TID) | ORAL | Status: DC | PRN
  Administered 2021-10-08 – 2021-10-10 (×5): 1 via ORAL
  Filled 2021-10-08 (×5): qty 1

## 2021-10-08 MED ORDER — GUAIFENESIN ER 600 MG PO TB12
600.0000 mg | ORAL_TABLET | Freq: Two times a day (BID) | ORAL | Status: DC
  Administered 2021-10-08 – 2021-10-14 (×13): 600 mg via ORAL
  Filled 2021-10-08 (×13): qty 1

## 2021-10-08 MED ORDER — BENZONATATE 100 MG PO CAPS
100.0000 mg | ORAL_CAPSULE | Freq: Three times a day (TID) | ORAL | Status: DC | PRN
Start: 2021-10-08 — End: 2021-10-14
  Administered 2021-10-09 – 2021-10-13 (×6): 100 mg via ORAL
  Filled 2021-10-08 (×6): qty 1

## 2021-10-08 MED ORDER — FUROSEMIDE 10 MG/ML IJ SOLN
20.0000 mg | Freq: Once | INTRAMUSCULAR | Status: AC
Start: 2021-10-08 — End: 2021-10-08
  Administered 2021-10-08: 20 mg via INTRAVENOUS
  Filled 2021-10-08: qty 2

## 2021-10-08 MED ORDER — SALINE SPRAY 0.65 % NA SOLN
1.0000 | NASAL | Status: DC | PRN
Start: 2021-10-08 — End: 2021-10-14
  Filled 2021-10-08 (×2): qty 44

## 2021-10-08 NOTE — Progress Notes (Signed)
Pharmacy Antibiotic Note  Mark Thomas is a 71 y.o. male for which pharmacy dosing Cefepime dosing for pneumonia.  Vancomycin discontinued  10/07/21.   MD noted 10/08/21 that patient appears relatively stable-however significant worsening radiologically-with near complete opacification of left lung. TRH discussed with PCCM and plans to continue Cefepime for 10 days total. BAL cultures negative so far.  Blood cultures negative and  no growth to date.   WBC improved to within normal limits. Afebrile  SCr  < 1 stable renal function.   6/09>> CXR: Significant interval worsening of left lung opacities-near complete opacification of left lung. 6/09>> CT chest: Extensive airspace disease throughout left lung  Plan: Continue Cefepime 2g q8hr x 10 days total.  Stop date in for 6/14 after 6 AM dose. Trend WBC, Fever, Renal function, & Clinical course   Height: _0  (185.4 cm) Weight: 117 kg (257 lb 15 oz) IBW/kg (Calculated) : 79.9  Temp (24hrs), Avg:98.7 F (37.1 C), Min:98.5 F (36.9 C), Max:98.8 F (37.1 C)  Recent Labs  Lab 10/03/21 1505 10/04/21 1436 10/05/21 0805 10/06/21 0252 10/07/21 0449 10/08/21 0502  WBC 13.3* 10.6* 11.8* 13.0* 9.1  --   CREATININE 1.01 0.99 0.82 1.10 0.94 0.77  LATICACIDVEN 1.3  --   --   --   --   --      Estimated Creatinine Clearance: 118.4 mL/min (by C-G formula based on SCr of 0.77 mg/dL).    Allergies  Allergen Reactions   Dilaudid [Hydromorphone] Other (See Comments)    Respiratory Arrest!!!!   Bactrim [Sulfamethoxazole-Trimethoprim] Rash   Benadryl [Diphenhydramine] Other (See Comments)    Muscle spasms   Phenobarbital Other (See Comments)    From childhood- reaction not recalled at this time   Penicillins Other (See Comments)    From childhood- reaction not recalled at this time   Antimicrobials this admission: azith PO 6/1 >> 6/3 Azith IV x 1 in ED 6/4 Vancomycin 6/4 >> 6/8  Cefepime 6/4 >> (6/14) for 10 days total  Microbiology  results: 6/4 BCx:  negative  6/4 Resp PCR: neg 6/5 BCx:  ngtd  6/7 fungus cx (bronch): sent 6/7 BAL Cx:  ngtd    Thank you for allowing pharmacy to be a part of this patient's care.  Nicole Cella, RPh Clinical Pharmacist 10/08/2021 2:05 PM [phone] Please check AMION for all [facility] phone numbers After 10:00 PM, call [place] [phone]

## 2021-10-08 NOTE — Evaluation (Signed)
Occupational Therapy Evaluation Patient Details Name: Mark Thomas MRN: [id number] DOB: May 06, 1950 Today's Date: 10/08/2021   History of Present Illness 71 y.o. male who presented to outside ED 10/04/21 with worsening shortness of breath and cough. recurrent pna (has had 2 recent hospitalizations for pna); +afib with RVR; 6/7 bronchoscopy with lavage; PMH includes: anxiety, CAD, depression, HTN, MI, stroke, vascular dementia, CKD, afib (not on anticoagulation), and orthostatic hypotension.   Clinical Impression   Pt describes his "good " days as being able to walk with a walker and bathe and dress himself with his wife assisting with shower transfer. When he is having a "bad" day, he is more dependent and uses his w/c. Pt presents with anxiety and impaired cognition, likely baseline. Pt cooperative with activities asked of him for assessment, but appearing less anxious once wife arrived. Pt requires set up to moderate assistance for ADLs, he did not simulate nor demonstrate LB ADLs. He stood with min assist, but declined OOB to chair with RW. Pt with Sp02 down to 85% on RA, replaced 2L with rebound to mid 90s. Pt with nosebleed, assisted with clean up and notified RN. Will follow acutely.      Recommendations for follow up therapy are one component of a multi-disciplinary discharge planning process, led by the attending physician.  Recommendations may be updated based on patient status, additional functional criteria and insurance authorization.   Follow Up Recommendations  Home health OT    Assistance Recommended at Discharge Frequent or constant Supervision/Assistance  Patient can return home with the following A little help with walking and/or transfers;A lot of help with bathing/dressing/bathroom;Assistance with cooking/housework;Direct supervision/assist for medications management;Direct supervision/assist for financial management;Assist for transportation;Help with stairs or ramp for  entrance    Functional Status Assessment  Patient has had a recent decline in their functional status and demonstrates the ability to make significant improvements in function in a reasonable and predictable amount of time.  Equipment Recommendations  None recommended by OT    Recommendations for Other Services       Precautions / Restrictions Precautions Precautions: Fall;Other (comment) Precaution Comments: watch for orthostasis Restrictions Weight Bearing Restrictions: No      Mobility Bed Mobility Overal bed mobility: Needs Assistance Bed Mobility: Supine to Sit, Sit to Supine     Supine to sit: Supervision, HOB elevated Sit to supine: Supervision   General bed mobility comments: no physical assist, +use of rail    Transfers Overall transfer level: Needs assistance Equipment used: Rolling walker (2 wheels) Transfers: Sit to/from Stand Sit to Stand: Min assist           General transfer comment: cues for hand placement, steadying assist      Balance Overall balance assessment: Needs assistance   Sitting balance-Leahy Scale: Fair     Standing balance support: Bilateral upper extremity supported, During functional activity, Reliant on assistive device for balance Standing balance-Leahy Scale: Poor                             ADL either performed or assessed with clinical judgement   ADL Overall ADL's : Needs assistance/impaired Eating/Feeding: Set up;Sitting;Bed level   Grooming: Wash/dry hands;Bed level;Moderate assistance   Upper Body Bathing: Moderate assistance;Sitting       Upper Body Dressing : Minimal assistance;Sitting       Toilet Transfer: Minimal assistance;Stand-pivot   Toileting- Clothing Manipulation and Hygiene: Total assistance;Sit to/from stand  General ADL Comments: Pt with decreased thoroughness and attention to task.     Vision Baseline Vision/History: 1 Wears glasses Ability to See in Adequate  Light: 0 Adequate Patient Visual Report: No change from baseline       Perception     Praxis      Pertinent Vitals/Pain Pain Assessment Pain Assessment: No/denies pain     Hand Dominance Right   Extremity/Trunk Assessment Upper Extremity Assessment Upper Extremity Assessment: Overall WFL for tasks assessed   Lower Extremity Assessment Lower Extremity Assessment: Defer to PT evaluation   Cervical / Trunk Assessment Cervical / Trunk Assessment: Other exceptions Cervical / Trunk Exceptions: obesity   Communication Communication Communication: No difficulties   Cognition Arousal/Alertness: Awake/alert Behavior During Therapy: Anxious, Flat affect Overall Cognitive Status: Impaired/Different from baseline Area of Impairment: Orientation, Memory, Problem solving, Attention                 Orientation Level: Place, Time Current Attention Level: Selective Memory: Decreased short-term memory       Problem Solving: Slow processing, Difficulty sequencing, Requires verbal cues General Comments: aware he was in the hospital, but not able to name     General Comments       Exercises     Shoulder Instructions      Home Living Family/patient expects to be discharged to:: Private residence Living Arrangements: Spouse/significant other Available Help at Discharge: Available 24 hours/day;Family Type of Home: House Home Access: Level entry     Home Layout: One level     Bathroom Shower/Tub: Occupational psychologist: Standard     Home Equipment: Civil engineer, contracting;Shower seat - built in;Hand held Museum/gallery curator (4 wheels);Toilet riser;Wheelchair - manual          Prior Functioning/Environment Prior Level of Function : Needs assist;History of Falls (last six months)             Mobility Comments: rollator and WC at home due to feeling badly and low BP (on "bad" days uses wc) ADLs Comments: wife helps him into and out of shower on his  good days, level of assist dependent on how pt is feeling, assisted for all IADLs        OT Problem List: Decreased strength;Decreased activity tolerance;Impaired balance (sitting and/or standing);Decreased knowledge of use of DME or AE;Decreased cognition      OT Treatment/Interventions: Self-care/ADL training;DME and/or AE instruction;Therapeutic activities;Patient/family education;Balance training    OT Goals(Current goals can be found in the care plan section) Acute Rehab OT Goals OT Goal Formulation: With patient Time For Goal Achievement: 10/22/21 Potential to Achieve Goals: Fair ADL Goals Pt Will Perform Grooming: with supervision;standing Pt Will Perform Upper Body Dressing: with supervision;sitting Pt Will Perform Lower Body Dressing: with supervision Pt Will Transfer to Toilet: with supervision;ambulating Pt Will Perform Toileting - Clothing Manipulation and hygiene: with supervision;sit to/from stand  OT Frequency: Min 2X/week    Co-evaluation              AM-PAC OT "6 Clicks" Daily Activity     Outcome Measure Help from another person eating meals?: A Little Help from another person taking care of personal grooming?: A Little Help from another person toileting, which includes using toliet, bedpan, or urinal?: A Lot Help from another person bathing (including washing, rinsing, drying)?: A Lot Help from another person to put on and taking off regular upper body clothing?: A Little Help from another person to put on and taking off regular  lower body clothing?: A Lot 6 Click Score: 15   End of Session Equipment Utilized During Treatment: Rolling walker (2 wheels);Oxygen  Activity Tolerance: Other (comment) (pt with anxiety, per RN does better with wife present) Patient left: in bed;with call bell/phone within reach;with bed alarm set;with nursing/sitter in room;with family/visitor present  OT Visit Diagnosis: Unsteadiness on feet (R26.81);Other abnormalities of  gait and mobility (R26.89);Muscle weakness (generalized) (M62.81);Other symptoms and signs involving cognitive function                Time: 4932-4199 OT Time Calculation (min): 15 min Charges:  OT General Charges $OT Visit: 1 Visit OT Evaluation $OT Eval Moderate Complexity: [place], OTR/L Acute Rehabilitation Services Office: [phone]   Malka So 10/08/2021, 2:04 PM

## 2021-10-08 NOTE — Progress Notes (Signed)
PROGRESS NOTE        PATIENT DETAILS Name: Mark Thomas Age: 71 y.o. Sex: male Date of Birth: 02-23-1951 Admit Date: 10/03/2021 Admitting Physician Bonnell Public, MD TXM:IWOEHOZY, Einar Pheasant, DO  Brief Summary: Patient is a 71 y.o.  male history of CAD-s/p PCI PAF-s/p watchman's device, CVA who has numerous hospitalizations for recurrent PNA-presenting with SOB-found to have hypoxia due to multifocal PNA and subsequently admitted to the hospitalist service.    Significant events: 4/05-4/10>> hospitalization for PNA 4/27-4/30>> hospitalization for sepsis/hypoxia due to PNA. 6/04>> admit to [facility]-due to pneumonia.  Significant studies: 4/28>> TTE: EF 24-82%, grade 2 diastolic dysfunction, RVSP 49.5, moderate aortic stenosis 6/04>> CXR: Worsening opacities in the left lung 6/09>> CXR: Significant interval worsening of left lung opacities-near complete opacification of left lung. 6/09>> CT chest: Extensive airspace disease throughout left lung   Significant microbiology data: 6/04>> COVID/INFLUENZA PCR: Negative 6/04>> blood culture: Negative 6/07>> BAL culture: Negative  6/07>> BAL AFB smear: Negative 6/07>> BAL AFB culture: Pending 6/07>> BAL fungal culture: Pending 6/07>> BAL cytology: No malignant cells identified  Procedures: 6/07>> bronchoscopy  Consults: PCCM  Subjective: Relatively awake and alert this morning.  Answering questions appropriately-complains of cough.  Objective: Vitals: Blood pressure (!) 167/95, pulse 99, temperature 98.5 F (36.9 C), temperature source Oral, resp. rate 20, height 6' 1" (1.854 m), weight 117 kg, SpO2 93 %.   Exam: Gen Exam:Alert awake-not in any distress HEENT:atraumatic, normocephalic Chest: Rales mostly in the left lung base to mid area. CVS:S1S2 regular Abdomen:soft non tender, non distended Extremities:no edema Neurology: Non focal Skin: no rash    Pertinent Labs/Radiology:    Latest Ref  Rng & Units 10/07/2021    4:49 AM 10/06/2021    2:52 AM 10/05/2021    8:05 AM  CBC  WBC 4.0 - 10.5 K/uL 9.1  13.0  11.8   Hemoglobin 13.0 - 17.0 g/dL 8.7  9.1  10.2   Hematocrit 39.0 - 52.0 % 27.1  29.0  31.1   Platelets 150 - 400 K/uL 175  204  190     Lab Results  Component Value Date   NA 139 10/08/2021   K 4.1 10/08/2021   CL 111 10/08/2021   CO2 22 10/08/2021      Assessment/Plan: Acute hypoxic respiratory failure due to recurrent multifocal pneumonia: Appears relatively stable-however significant worsening radiologically-with near complete opacification of left lung.  BAL cultures negative so far-cefepime has a fairly broad coverage-unclear why he would have such significant worsening (?  Unwitnessed aspiration episode).  Discussed at length with PCCM-Dr Olalere-continue cefepime-plan at least 10 days of antibiotic treatment-continue prednisone.  BAL cultures negative so far.  Continue to watch closely.  History of recurrent pneumonia: Occurring since COVID-19 April 2021-apparently improved after course of antibiotics/steroids-s/p bronchoscopy on 6/7-see above.  History of interstitial lung disease: Suspected GERD contributing to his ILD.  Supportive care-on PPI.  Chronic HFpEF: Volume status relatively stable-PCCM recommending IV Lasix to ensure negative balance.  Watch closely given history of orthostatic hypotension.  SVT: Continue beta-blocker-evaluated by cardiology-not felt to have A-fib-continue beta-blocker.  No longer on amiodarone.  History of PAF-s/p watchman's device 03/2019 and RFA with isolation of all 4 pulmonary veins in 2021: Continue telemetry monitoring.  Per cardiology note-not felt to have A-fib.  No longer on anticoagulation with watchman's device in place.  History  of CAD s/p multiple PCI's: Currently without any anginal symptoms.  Continue aspirin/Plavix/statin/beta-blocker.  History of ASD-s/p closure 01/22/2020  History of moderate aortic stenosis:  Stable for outpatient follow-up with cardiology.  History of CVA: No focal deficits.  Continue antiplatelets-statin.  History of sick sinus syndrome-s/p PPM placement: Continue telemetry monitoring.  History of vascular dementia: Appears to be mild-continue Namenda//Seroquel.  Maintain delirium precautions.  History of anxiety/depression: Continue Lexapro  History of orthostatic hypotension: Continue midodrine  GERD: Continue PPI  Obesity: Estimated body mass index is 34.03 kg/m as calculated from the following:   Height as of this encounter: _0  (1.854 m).   Weight as of this encounter: 117 kg.   Code status:   Code Status: Full Code   DVT Prophylaxis: enoxaparin (LOVENOX) injection 40 mg Start: 10/04/21 2200   Family Communication: Spouse-Annette-[phone]-And son updated at bedside on 6/9.   Disposition Plan: Status is: Inpatient Remains inpatient appropriate because: Recurrent PNA-awaiting BAL cultures-remains on empiric IV antibiotics.   Planned Discharge Destination:Home   Diet: Diet Order             Diet Heart Room service appropriate? Yes; Fluid consistency: Thin  Diet effective now                     Antimicrobial agents: Anti-infectives (From admission, onward)    Start     Dose/Rate Route Frequency Ordered Stop   10/05/21 1800  vancomycin (VANCOREADY) IVPB 1250 mg/250 mL  Status:  Discontinued        1,250 mg 166.7 mL/hr over 90 Minutes Intravenous Every 12 hours 10/05/21 0957 10/07/21 1200   10/04/21 0615  Vancomycin (VANCOCIN) 1,500 mg in sodium chloride 0.9 % 500 mL IVPB        1,500 mg 250 mL/hr over 120 Minutes Intravenous  Once 10/04/21 0600 10/04/21 0926   10/04/21 0605  vancomycin (VANCOCIN) 1000 MG powder       Note to Pharmacy: April Holding C: cabinet override      10/04/21 0605 10/04/21 1814   10/04/21 0605  vancomycin (VANCOCIN) 500 MG powder       Note to Pharmacy: April Holding C: cabinet override      10/04/21 0605  10/04/21 1814   10/04/21 0400  vancomycin (VANCOREADY) IVPB 1500 mg/300 mL  Status:  Discontinued        1,500 mg 150 mL/hr over 120 Minutes Intravenous Every 12 hours 10/03/21 1357 10/05/21 0957   10/03/21 1445  vancomycin (VANCOCIN) IVPB 1000 mg/200 mL premix       See Hyperspace for full Linked Orders Report.   1,000 mg 200 mL/hr over 60 Minutes Intravenous  Once 10/03/21 1335 10/03/21 1938   10/03/21 1400  ceFEPIme (MAXIPIME) 2 g in sodium chloride 0.9 % 100 mL IVPB        2 g 200 mL/hr over 30 Minutes Intravenous Every 8 hours 10/03/21 1356 10/13/21 1359   10/03/21 1345  vancomycin (VANCOCIN) IVPB 1000 mg/200 mL premix       See Hyperspace for full Linked Orders Report.   1,000 mg 200 mL/hr over 60 Minutes Intravenous  Once 10/03/21 1335 10/03/21 1846   10/03/21 1330  vancomycin (VANCOCIN) IVPB 1000 mg/200 mL premix  Status:  Discontinued        1,000 mg 200 mL/hr over 60 Minutes Intravenous  Once 10/03/21 1321 10/03/21 1335   10/03/21 1330  ceFEPIme (MAXIPIME) 2 g in sodium chloride 0.9 % 100 mL IVPB  Status:  Discontinued        2 g 200 mL/hr over 30 Minutes Intravenous  Once 10/03/21 1321 10/03/21 1356   10/03/21 1330  azithromycin (ZITHROMAX) 500 mg in sodium chloride 0.9 % 250 mL IVPB        500 mg 250 mL/hr over 60 Minutes Intravenous  Once 10/03/21 1321 10/03/21 1710        MEDICATIONS: Scheduled Meds:  allopurinol  100 mg Oral Daily   aspirin EC  81 mg Oral q AM   atorvastatin  80 mg Oral Daily   bacitracin   Topical BID   chlorhexidine  15 mL Mouth/Throat Once   cholecalciferol  400 Units Oral Daily   clopidogrel  75 mg Oral Daily   enoxaparin (LOVENOX) injection  40 mg Subcutaneous Q24H   escitalopram  30 mg Oral Daily   fludrocortisone  0.1 mg Oral BID   guaiFENesin  600 mg Oral BID   hydrocortisone cream   Topical TID   latanoprost  1 drop Both Eyes QHS   lidocaine  1 patch Transdermal Q24H   magnesium oxide  400 mg Oral Daily   memantine  10 mg Oral BID    metoprolol tartrate  12.5 mg Oral BID   midodrine  5 mg Oral TID WC   mometasone-formoterol  2 puff Inhalation BID   ondansetron (ZOFRAN) IV  4 mg Intravenous Once   pantoprazole  40 mg Oral QPM   potassium citrate  10 mEq Oral Daily   predniSONE  40 mg Oral Q breakfast   primidone  100 mg Oral BID   QUEtiapine  100 mg Oral QHS   ranolazine  1,000 mg Oral BID   rOPINIRole  4 mg Oral QHS   Continuous Infusions:  sodium chloride 10 mL/hr at 10/04/21 0539   ceFEPime (MAXIPIME) IV 2 g (10/08/21 1203)   PRN Meds:.sodium chloride, acetaminophen, benzonatate, ipratropium-albuterol   I have personally reviewed following labs and imaging studies  LABORATORY DATA: CBC: Recent Labs  Lab 10/03/21 1505 10/04/21 1436 10/05/21 0805 10/06/21 0252 10/07/21 0449  WBC 13.3* 10.6* 11.8* 13.0* 9.1  NEUTROABS 10.7*  --   --   --   --   HGB 10.4* 9.4* 10.2* 9.1* 8.7*  HCT 30.8* 29.1* 31.1* 29.0* 27.1*  MCV 100.7* 102.5* 102.0* 105.5* 104.2*  PLT 209 188 190 204 175     Basic Metabolic Panel: Recent Labs  Lab 10/04/21 1436 10/05/21 0805 10/06/21 0252 10/07/21 0449 10/08/21 0502  NA 138 137 137 140 139  K 3.2* 3.8 4.0 4.5 4.1  CL 106 108 107 109 111  CO2 23 21* 19* 23 22  GLUCOSE 113* 182* 232* 114* 104*  BUN 8 12 26* 28* 19  CREATININE 0.99 0.82 1.10 0.94 0.77  CALCIUM 8.3* 8.5* 8.7* 8.7* 8.1*  MG  --  1.9 2.0 2.1  --      GFR: Estimated Creatinine Clearance: 118.4 mL/min (by C-G formula based on SCr of 0.77 mg/dL).  Liver Function Tests: Recent Labs  Lab 10/03/21 1505  AST 26  ALT 20  ALKPHOS 56  BILITOT 1.0  PROT 7.1  ALBUMIN 2.8*    No results for input(s): "LIPASE", "AMYLASE" in the last 168 hours. No results for input(s): "AMMONIA" in the last 168 hours.  Coagulation Profile: Recent Labs  Lab 10/03/21 1505  INR 1.2     Cardiac Enzymes: No results for input(s): "CKTOTAL", "CKMB", "CKMBINDEX", "TROPONINI" in the last 168 hours.  BNP (last 3  results) Recent Labs    03/18/21 1004  PROBNP 370     Lipid Profile: No results for input(s): "CHOL", "HDL", "LDLCALC", "TRIG", "CHOLHDL", "LDLDIRECT" in the last 72 hours.  Thyroid Function Tests: No results for input(s): "TSH", "T4TOTAL", "FREET4", "T3FREE", "THYROIDAB" in the last 72 hours.  Anemia Panel: No results for input(s): "VITAMINB12", "FOLATE", "FERRITIN", "TIBC", "IRON", "RETICCTPCT" in the last 72 hours.  Urine analysis:    Component Value Date/Time   COLORURINE AMBER (A) 10/07/2021 2003   APPEARANCEUR CLEAR 10/07/2021 2003   LABSPEC 1.021 10/07/2021 2003   PHURINE 5.0 10/07/2021 2003   GLUCOSEU NEGATIVE 10/07/2021 2003   HGBUR NEGATIVE 10/07/2021 2003   [place] NEGATIVE 10/07/2021 2003   [place] NEGATIVE 10/07/2021 2003   PROTEINUR 30 (A) 10/07/2021 2003   NITRITE NEGATIVE 10/07/2021 2003   LEUKOCYTESUR NEGATIVE 10/07/2021 2003    Sepsis Labs: Lactic Acid, Venous    Component Value Date/Time   LATICACIDVEN 1.3 10/03/2021 1505    MICROBIOLOGY: Recent Results (from the past 240 hour(s))  Culture, blood (Routine x 2)     Status: None   Collection Time: 10/03/21  3:00 PM   Specimen: BLOOD LEFT ARM  Result Value Ref Range Status   Specimen Description   Final    BLOOD LEFT ARM BLOOD Performed at [facility], [address] [place], [place] [address]    Special Requests   Final    Blood Culture adequate volume BOTTLES DRAWN AEROBIC AND ANAEROBIC Performed at [facility], [address] [place], [place] [address]    Culture   Final    NO GROWTH 5 DAYS Performed at [facility], [place] [address] [place], [place] [address]    Report Status 10/08/2021 FINAL  Final  Resp Panel by RT-PCR (Flu A&B, Covid) Anterior Nasal Swab     Status: None   Collection Time: 10/03/21  3:05 PM   Specimen: Anterior Nasal Swab  Result Value Ref Range Status   SARS Coronavirus 2 by RT PCR NEGATIVE NEGATIVE Final    Comment:  (NOTE) SARS-CoV-2 target nucleic acids are NOT DETECTED.  The SARS-CoV-2 RNA is generally detectable in upper respiratory specimens during the acute phase of infection. The lowest concentration of SARS-CoV-2 viral copies this assay can detect is 138 copies/mL. A negative result does not preclude SARS-Cov-2 infection and should not be used as the sole basis for treatment or other patient management decisions. A negative result may occur with  improper specimen collection/handling, submission of specimen other than nasopharyngeal swab, presence of viral mutation(s) within the areas targeted by this assay, and inadequate number of viral copies(<138 copies/mL). A negative result must be combined with clinical observations, patient history, and epidemiological information. The expected result is Negative.  Fact Sheet for Patients:  EntrepreneurPulse.com.au  Fact Sheet for Healthcare Providers:  IncredibleEmployment.be  This test is no t yet approved or cleared by the [place] FDA and  has been authorized for detection and/or diagnosis of SARS-CoV-2 by FDA under an Emergency Use Authorization (EUA). This EUA will remain  in effect (meaning this test can be used) for the duration of the COVID-19 declaration under Section 564(b)(1) of the Act, 21 U.S.C.section 360bbb-3(b)(1), unless the authorization is terminated  or revoked sooner.       Influenza A by PCR NEGATIVE NEGATIVE Final   Influenza B by PCR NEGATIVE NEGATIVE Final    Comment: (NOTE) The Xpert Xpress SARS-CoV-2/FLU/RSV plus assay is intended as an  aid in the diagnosis of influenza from Nasopharyngeal swab specimens and should not be used as a sole basis for treatment. Nasal washings and aspirates are unacceptable for Xpert Xpress SARS-CoV-2/FLU/RSV testing.  Fact Sheet for Patients: EntrepreneurPulse.com.au  Fact Sheet for Healthcare  Providers: IncredibleEmployment.be  This test is not yet approved or cleared by the [place] FDA and has been authorized for detection and/or diagnosis of SARS-CoV-2 by FDA under an Emergency Use Authorization (EUA). This EUA will remain in effect (meaning this test can be used) for the duration of the COVID-19 declaration under Section 564(b)(1) of the Act, 21 U.S.C. section 360bbb-3(b)(1), unless the authorization is terminated or revoked.  Performed at [facility], [address] [place], [place] [address]   Culture, blood (Routine x 2)     Status: None (Preliminary result)   Collection Time: 10/04/21  2:37 PM   Specimen: BLOOD LEFT HAND  Result Value Ref Range Status   Specimen Description BLOOD LEFT HAND  Final   Special Requests   Final    BOTTLES DRAWN AEROBIC AND ANAEROBIC Blood Culture adequate volume   Culture   Final    NO GROWTH 4 DAYS Performed at [facility], [place] [address], [place], [place] [address]    Report Status PENDING  Incomplete  Acid Fast Smear (AFB)     Status: None   Collection Time: 10/06/21 12:42 PM   Specimen: PATH Cytology Bronchial lavage; Body Fluid  Result Value Ref Range Status   AFB Specimen Processing Concentration  Final   Acid Fast Smear Negative  Final    Comment: (NOTE) Performed At: [facility] [place], [place] [id number] Rush Farmer MD JS:[id number]    Source (AFB) BRONCHIAL ALVEOLAR LAVAGE  Final    Comment: LINGULA Performed at [facility], [place] [address] [place], [place] [address]   Culture, BAL-quantitative w Gram Stain     Status: None   Collection Time: 10/06/21 12:42 PM   Specimen: Bronchoalveolar Lavage  Result Value Ref Range Status   Specimen Description BRONCHIAL ALVEOLAR LAVAGE  Final   Special Requests LINGULA  Final   Gram Stain   Final    RARE WBC PRESENT,BOTH PMN AND MONONUCLEAR NO ORGANISMS SEEN    Culture   Final    NO GROWTH 2  DAYS Performed at [facility], [place] [address], [place], [place] [address]    Report Status 10/08/2021 FINAL  Final    RADIOLOGY STUDIES/RESULTS: CT CHEST WO CONTRAST  Result Date: 10/08/2021 CLINICAL DATA:  Evaluate pneumonia. EXAM: CT CHEST WITHOUT CONTRAST TECHNIQUE: Multidetector CT imaging of the chest was performed following the standard protocol without IV contrast. RADIATION DOSE REDUCTION: This exam was performed according to the departmental dose-optimization program which includes automated exposure control, adjustment of the mA and/or kV according to patient size and/or use of iterative reconstruction technique. COMPARISON:  06/22/2021 FINDINGS: Cardiovascular: Mild cardiac enlargement. No pericardial effusion. Aortic atherosclerosis and multi vessel coronary artery calcification. Left chest wall pacer is noted with leads in the right atrial appendage and right ventricle. Mediastinum/Nodes: Thyroid gland and trachea appear unremarkable. Mild circumferential wall thickening of the distal esophagus is noted. Small to moderate hiatal hernia noted. Multiple prominent (non pathologically enlarged) mediastinal lymph nodes are identified and appear increased in size from previous exam. For example, index right paratracheal lymph node measures 1.2 cm, image 19/3. Previously 0.9 cm. Left paratracheal lymph node measures 1.1 cm, image 28/3. Previously  0.6 cm. Lungs/Pleura: Small bilateral pleural effusions are identified, new from previous exam. There is a background interstitial abnormality with lower lung zone predominant interstitial reticulation and septal thickening. Extensive airspace and ground-glass disease is identified throughout the left upper and lower lobes which is concerning for multifocal infection. Posterior and medial right upper lobe ground-glass and airspace opacification is also identified and concerning for inflammation/infection. Patchy ground-glass attenuation is noted within  the right middle lobe. Within the right lower lobe there is increased soft tissue thickening of the peribronchovascular interstitium. Upper Abdomen: No acute abnormality. Benign Bosniak class 1 cyst arises off the upper pole of the right kidney measuring 2 cm. No follow-up recommended. Musculoskeletal: No chest wall mass or suspicious bone lesions identified. IMPRESSION: 1. Extensive airspace and ground-glass disease identified throughout the left upper and lower lobes as well as posterior and medial right upper lobe. Findings are concerning for multifocal infection. 2. Small bilateral pleural effusions, new from previous exam. 3. Small to moderate hiatal hernia with mild circumferential wall thickening of the distal esophagus. Correlate for any clinical signs or symptoms of esophagitis. 4. Increased size of mediastinal lymph nodes which is likely reactive in the setting of multifocal pneumonia. 5. Coronary artery calcifications. 6. Aortic Atherosclerosis (ICD10-I70.0). Electronically Signed   By: Kerby Moors M.D.   On: 10/08/2021 09:43   DG Chest Port 1 View  Result Date: 10/08/2021 CLINICAL DATA:  Shortness of breath. EXAM: PORTABLE CHEST 1 VIEW COMPARISON:  Radiograph October 03, 2021 FINDINGS: Left chest pacemaker with leads overlying the right atrium right ventricle. The heart size and mediastinal contours are obscured. Significant increase in the left lung opacities now with near complete opacification. Mild right-sided interstitial thickening. No acute osseous abnormality. IMPRESSION: Significant interval increase in the left lung opacities now with near complete opacification concern for worsening pneumonia. Mild right-sided interstitial thickening. Electronically Signed   By: Dahlia Bailiff M.D.   On: 10/08/2021 08:36     LOS: 4 days   Oren Binet, MD  [facility]    To contact the attending provider between 7A-7P or the covering provider during after hours 7P-7A, please log into the  web site [email or web address] and access using universal [place] password for that web site. If you do not have the password, please call the hospital operator.  10/08/2021, 1:49 PM

## 2021-10-08 NOTE — Progress Notes (Signed)
NAME:  Mark Thomas, MRN:  [id number], DOB:  [birth year], LOS: 4 ADMISSION DATE:  10/03/2021, CONSULTATION DATE: 10/05/2021 REFERRING MD: Triad, CHIEF COMPLAINT: Refractory pneumonia  History of Present Illness:  71 year old male who is a never smoker with history of hypertension coronary disease with a stent along with CVA, gastroesophageal reflux disease, chronic kidney disease with current creatinine .90.  He has a history of atrial fibrillation followed by cardiology along with a permanent pacemaker and currently being paced.  He reports recently being treated for pneumonia with antibiotics with some improvement but signs and symptoms of infection have returned he noticed increasing orthopnea and generalized failure to thrive with episode of hemoptysis without fevers chills or sweats.  He did had COVID in 12/22 and has had problems with respiratory difficulty since.  He has been followed in the office by Dr. Erin Fulling and pulmonary critical care has been asked to evaluate while in the hospital due to his ongoing symptoms and been refractory to outpatient treatment.  Pertinent  Medical History   Past Medical History:  Diagnosis Date   Anxiety    Coronary artery disease    Depression    Essential tremor    GERD (gastroesophageal reflux disease)    High cholesterol    Hypertension    Myocardial infarct (HCC)    Orthostatic hypotension    Stroke [facility])    Vascular dementia ([place])      Significant Hospital Events: Including procedures, antibiotic start and stop dates in addition to other pertinent events   6/6-pulmonary consult 6/7-bronchoscopy 6/9-chest x-ray much worse   Interim History / Subjective:  Refractory pneumonia Interstitial lung disease No longer having hemoptysis  Still having difficulty with his breathing  Objective   Blood pressure 116/84, pulse 95, temperature 98.8 F (37.1 C), temperature source Oral, resp. rate 20, height _0  (1.854 m), weight 117 kg, SpO2 96  %.        Intake/Output Summary (Last 24 hours) at 10/08/2021 1003 Last data filed at 10/07/2021 1215 Gross per 24 hour  Intake --  Output 200 ml  Net -200 ml   Filed Weights   10/03/21 1229 10/06/21 0616 10/06/21 1154  Weight: 117.9 kg 117 kg 117 kg    Examination: General: Elderly, does not appear to be in distress at rest HENT: Moist oral mucosa Lungs: Bibasal rales Cardiovascular: S1-S2 appreciated Abdomen: Soft, bowel sounds appreciated Extremities: No clubbing, no edema Neuro: Alert and oriented x3 GU: [facility] Problem list     Assessment & Plan:  Recurrent pneumonia with hypoxemia -Has had 5 days of antibiotics -Leukocytosis is better -No fever -No organism on BAL  Hemoptysis -No longer with hemoptysis -Remains on anticoagulation  Interstitial lung disease  Is post bronchoscopy which he tolerated well -No organisms identified from bronchoscopy so far  Paroxysmal atrial fibrillation Coronary artery disease History of MI  Class II obesity  Unfortunately still quite symptomatic with increased oxygen needs Worsening chest x-ray -CT scan of the chest to further clarify the changes in the left lungadd -Add chest PT -Continue flutter device use -Continue expectorants   CT scan reviewed showing multifocal infiltrate, small pleural effusions Will suggest to continue cefepime at present-7 to 10 days total Continue prednisone  Best Practice (right click and "Reselect all SmartList Selections" daily)   Diet/type: Regular consistency (see orders) DVT prophylaxis: LMWH GI prophylaxis: PPI Lines: N/A Foley:  N/A Code Status:  full code Last date of multidisciplinary goals of care discussion [  per primary team]  Labs   CBC: Recent Labs  Lab 10/03/21 1505 10/04/21 1436 10/05/21 0805 10/06/21 0252 10/07/21 0449  WBC 13.3* 10.6* 11.8* 13.0* 9.1  NEUTROABS 10.7*  --   --   --   --   HGB 10.4* 9.4* 10.2* 9.1* 8.7*  HCT 30.8*  29.1* 31.1* 29.0* 27.1*  MCV 100.7* 102.5* 102.0* 105.5* 104.2*  PLT 209 188 190 204 254    Basic Metabolic Panel: Recent Labs  Lab 10/04/21 1436 10/05/21 0805 10/06/21 0252 10/07/21 0449 10/08/21 0502  NA 138 137 137 140 139  K 3.2* 3.8 4.0 4.5 4.1  CL 106 108 107 109 111  CO2 23 21* 19* 23 22  GLUCOSE 113* 182* 232* 114* 104*  BUN 8 12 26* 28* 19  CREATININE 0.99 0.82 1.10 0.94 0.77  CALCIUM 8.3* 8.5* 8.7* 8.7* 8.1*  MG  --  1.9 2.0 2.1  --    GFR: Estimated Creatinine Clearance: 118.4 mL/min (by C-G formula based on SCr of 0.77 mg/dL). Recent Labs  Lab 10/03/21 1505 10/04/21 1436 10/05/21 0805 10/06/21 0252 10/07/21 0449  WBC 13.3* 10.6* 11.8* 13.0* 9.1  LATICACIDVEN 1.3  --   --   --   --     Liver Function Tests: Recent Labs  Lab 10/03/21 1505  AST 26  ALT 20  ALKPHOS 56  BILITOT 1.0  PROT 7.1  ALBUMIN 2.8*   No results for input(s): "LIPASE", "AMYLASE" in the last 168 hours. No results for input(s): "AMMONIA" in the last 168 hours.  ABG    Component Value Date/Time   TCO2 29 08/04/2021 1535     Coagulation Profile: Recent Labs  Lab 10/03/21 1505  INR 1.2    Cardiac Enzymes: No results for input(s): "CKTOTAL", "CKMB", "CKMBINDEX", "TROPONINI" in the last 168 hours.  HbA1C: Hgb A1c MFr Bld  Date/Time Value Ref Range Status  01/08/2021 01:38 AM 6.0 (H) 4.8 - 5.6 % Final    Comment:    (NOTE) Pre diabetes:          5.7%-6.4%  Diabetes:              >6.4%  Glycemic control for   <7.0% adults with diabetes     CBG: No results for input(s): "GLUCAP" in the last 168 hours.  Review of Systems:   Some shortness of breath  Past Medical History:  He,  has a past medical history of Anxiety, Coronary artery disease, Depression, Essential tremor, GERD (gastroesophageal reflux disease), High cholesterol, Hypertension, Myocardial infarct ([place]), Orthostatic hypotension, Stroke ([place]), and Vascular dementia ([place]).   Surgical History:    Past Surgical History:  Procedure Laterality Date   APPENDECTOMY     CARDIAC SURGERY     CHOLECYSTECTOMY     FLEXIBLE BRONCHOSCOPY N/A 10/06/2021   Procedure: FLEXIBLE BRONCHOSCOPY;  Surgeon: Margaretha Seeds, MD;  Location: [facility];  Service: Cardiopulmonary;  Laterality: N/A;   HERNIA REPAIR     NASAL SINUS SURGERY     SHOULDER SURGERY       Social History:   reports that he has never smoked. He has never been exposed to tobacco smoke. He has never used smokeless tobacco. He reports that he does not currently use alcohol. He reports that he does not use drugs.   Family History:  His family history includes Hypertension in his father and mother; Stroke in his father and mother.   Allergies Allergies  Allergen Reactions   Dilaudid [Hydromorphone] Other (See  Comments)    Respiratory Arrest!!!!   Bactrim [Sulfamethoxazole-Trimethoprim] Rash   Benadryl [Diphenhydramine] Other (See Comments)    Muscle spasms   Phenobarbital Other (See Comments)    From childhood- reaction not recalled at this time   Penicillins Other (See Comments)    From childhood- reaction not recalled at this time    Sherrilyn Rist, MD [facility] Pager: See Shea Evans

## 2021-10-09 DIAGNOSIS — J9601 Acute respiratory failure with hypoxia: Secondary | ICD-10-CM | POA: Diagnosis not present

## 2021-10-09 DIAGNOSIS — Z8673 Personal history of transient ischemic attack (TIA), and cerebral infarction without residual deficits: Secondary | ICD-10-CM | POA: Diagnosis not present

## 2021-10-09 DIAGNOSIS — J189 Pneumonia, unspecified organism: Secondary | ICD-10-CM | POA: Diagnosis not present

## 2021-10-09 DIAGNOSIS — I2510 Atherosclerotic heart disease of native coronary artery without angina pectoris: Secondary | ICD-10-CM | POA: Diagnosis not present

## 2021-10-09 LAB — BASIC METABOLIC PANEL
Anion gap: 7 (ref 5–15)
BUN: 17 mg/dL (ref 8–23)
CO2: 27 mmol/L (ref 22–32)
Calcium: 8.3 mg/dL — ABNORMAL LOW (ref 8.9–10.3)
Chloride: 104 mmol/L (ref 98–111)
Creatinine, Ser: 0.75 mg/dL (ref 0.61–1.24)
GFR, Estimated: >60 mL/min (ref >60)
Glucose, Bld: 124 mg/dL — ABNORMAL HIGH (ref 70–99)
Potassium: 3.9 mmol/L (ref 3.5–5.1)
Sodium: 138 mmol/L (ref 135–145)

## 2021-10-09 LAB — CBC
HCT: 27.6 % — ABNORMAL LOW (ref 39.0–52.0)
Hemoglobin: 8.8 g/dL — ABNORMAL LOW (ref 13.0–17.0)
MCH: 32.5 pg (ref 26.0–34.0)
MCHC: 31.9 g/dL (ref 30.0–36.0)
MCV: 101.8 fL — ABNORMAL HIGH (ref 80.0–100.0)
Platelets: 161 10*3/uL (ref 150–400)
RBC: 2.71 MIL/uL — ABNORMAL LOW (ref 4.22–5.81)
RDW: 15.1 % (ref 11.5–15.5)
WBC: 10.1 10*3/uL (ref 4.0–10.5)
nRBC: 0.4 % — ABNORMAL HIGH (ref 0.0–0.2)

## 2021-10-09 LAB — CULTURE, BLOOD (ROUTINE X 2)
Culture: NO GROWTH
Special Requests: ADEQUATE

## 2021-10-09 MED ORDER — FUROSEMIDE 10 MG/ML IJ SOLN
20.0000 mg | Freq: Once | INTRAMUSCULAR | Status: AC
Start: 2021-10-09 — End: 2021-10-09
  Administered 2021-10-09: 20 mg via INTRAVENOUS
  Filled 2021-10-09: qty 2

## 2021-10-09 MED ORDER — ALBUTEROL SULFATE (2.5 MG/3ML) 0.083% IN NEBU
2.5000 mg | INHALATION_SOLUTION | Freq: Two times a day (BID) | RESPIRATORY_TRACT | Status: DC
Start: 2021-10-10 — End: 2021-10-14
  Administered 2021-10-10 – 2021-10-14 (×9): 2.5 mg via RESPIRATORY_TRACT
  Filled 2021-10-09 (×9): qty 3

## 2021-10-09 MED ORDER — ALBUTEROL SULFATE (2.5 MG/3ML) 0.083% IN NEBU
2.5000 mg | INHALATION_SOLUTION | Freq: Two times a day (BID) | RESPIRATORY_TRACT | Status: DC
Start: 2021-10-10 — End: 2021-10-09

## 2021-10-09 NOTE — Progress Notes (Signed)
NAME:  Mark Thomas, MRN:  [id number], DOB:  10/22/1950, LOS: 5 ADMISSION DATE:  10/03/2021, CONSULTATION DATE: 10/05/2021 REFERRING MD: Triad, CHIEF COMPLAINT: Refractory pneumonia  History of Present Illness:  71 year old male who is a never smoker with history of hypertension coronary disease with a stent along with CVA, gastroesophageal reflux disease, chronic kidney disease with current creatinine .90.  He has a history of atrial fibrillation followed by cardiology along with a permanent pacemaker and currently being paced.  He reports recently being treated for pneumonia with antibiotics with some improvement but signs and symptoms of infection have returned he noticed increasing orthopnea and generalized failure to thrive with episode of hemoptysis without fevers chills or sweats.  He did had COVID in 12/22 and has had problems with respiratory difficulty since.  He has been followed in the office by Dr. Erin Fulling and pulmonary critical care has been asked to evaluate while in the hospital due to his ongoing symptoms and been refractory to outpatient treatment.  Pertinent  Medical History   Past Medical History:  Diagnosis Date   Anxiety    Coronary artery disease    Depression    Essential tremor    GERD (gastroesophageal reflux disease)    High cholesterol    Hypertension    Myocardial infarct (HCC)    Orthostatic hypotension    Stroke [facility])    Vascular dementia ([place])      Significant Hospital Events: Including procedures, antibiotic start and stop dates in addition to other pertinent events   6/6-pulmonary consult 6/7-bronchoscopy 6/9-chest x-ray much worse   Interim History / Subjective:   Complains of ongoing cough and chest pain  Objective   Blood pressure (!) 167/106, pulse 85, temperature 98.2 F (36.8 C), temperature source Oral, resp. rate 20, height _0  (1.854 m), weight 117 kg, SpO2 95 %.        Intake/Output Summary (Last 24 hours) at 10/09/2021 1412 Last  data filed at 10/09/2021 1327 Gross per 24 hour  Intake 1110 ml  Output 3250 ml  Net -2140 ml   Filed Weights   10/03/21 1229 10/06/21 0616 10/06/21 1154  Weight: 117.9 kg 117 kg 117 kg    Examination: Gen:      No acute distress HEENT:  EOMI, sclera anicteric Neck:     No masses; no thyromegaly Lungs:    Scattered crackles CV:         Regular rate and rhythm; no murmurs Abd:      + bowel sounds; soft, non-tender; no palpable masses, no distension Ext:    No edema; adequate peripheral perfusion Skin:      Warm and dry; no rash Neuro: alert and oriented x 3 Psych: normal mood and affect   CT chest on 6/9 reviewed with extensive airspace disease bilaterally left greater than right  Resolved Hospital Problem list     Assessment & Plan:  Recurrent pneumonia with hypoxemia Baseline mild interstitial lung disease CT reviewed with extensive consolidative changes mostly on the left consistent with multifocal pneumonia O2 requirements are stable BAL cultures to date have been negative Continue cefepime and steroids  GERD Continue on PPI He has had speech evaluation and barium swallow during prior admissions with no significant aspiration noted  Hemoptysis No more episodes of hemoptysis.  Continues on anticoagulation  We will check back on him on 6/12.  Please call with any questions over the week  Best Practice (right click and "Reselect all SmartList Selections" daily)  Diet/type: Regular consistency (see orders) DVT prophylaxis: LMWH GI prophylaxis: PPI Lines: N/A Foley:  N/A Code Status:  full code Last date of multidisciplinary goals of care discussion [per primary team]  Signature:   Marshell Garfinkel MD [facility] See Amion for pager  If no response to pager , please call [phone] until 7pm After 7:00 pm call Elink  [phone] 10/09/2021, 2:12 PM

## 2021-10-09 NOTE — Plan of Care (Signed)

## 2021-10-09 NOTE — Progress Notes (Signed)
PROGRESS NOTE        PATIENT DETAILS Name: Mark Thomas Age: 71 y.o. Sex: male Date of Birth: 1951-03-28 Admit Date: 10/03/2021 Admitting Physician Bonnell Public, MD JAS:[id number], Einar Pheasant, DO  Brief Summary: Patient is a 71 y.o.  male history of CAD-s/p PCI PAF-s/p watchman's device, CVA who has numerous hospitalizations for recurrent PNA-presenting with SOB-found to have hypoxia due to multifocal PNA and subsequently admitted to the hospitalist service.    Significant events: 4/05-4/10>> hospitalization for PNA 4/27-4/30>> hospitalization for sepsis/hypoxia due to PNA. 6/04>> admit to [facility]-due to pneumonia.  Significant studies: 4/28>> TTE: EF 34-19%, grade 2 diastolic dysfunction, RVSP 49.5, moderate aortic stenosis 6/04>> CXR: Worsening opacities in the left lung 6/09>> CXR: Significant interval worsening of left lung opacities-near complete opacification of left lung. 6/09>> CT chest: Extensive airspace disease throughout left lung   Significant microbiology data: 6/04>> COVID/INFLUENZA PCR: Negative 6/04>> blood culture: Negative 6/07>> BAL culture: Negative  6/07>> BAL AFB smear: Negative 6/07>> BAL AFB culture: Pending 6/07>> BAL fungal culture: Pending 6/07>> BAL cytology: No malignant cells identified  Procedures: 6/07>> bronchoscopy  Consults: PCCM  Subjective: Lying comfortably in bed-on 2 L of oxygen.  Had some mild epistaxis yesterday that has since resolved.  Complains of cough and ongoing bilateral musculoskeletal chest pain.  Objective: Vitals: Blood pressure (!) 167/106, pulse 85, temperature 98.2 F (36.8 C), temperature source Oral, resp. rate 20, height 6' 1" (1.854 m), weight 117 kg, SpO2 95 %.   Exam: Gen Exam:Alert awake-not in any distress HEENT:atraumatic, normocephalic Chest: B/L clear to auscultation anteriorly CVS:S1S2 regular Abdomen:soft non tender, non distended Extremities:no edema Neurology: Non  focal Skin: no rash    Pertinent Labs/Radiology:    Latest Ref Rng & Units 10/09/2021   12:38 AM 10/07/2021    4:49 AM 10/06/2021    2:52 AM  CBC  WBC 4.0 - 10.5 K/uL 10.1  9.1  13.0   Hemoglobin 13.0 - 17.0 g/dL 8.8  8.7  9.1   Hematocrit 39.0 - 52.0 % 27.6  27.1  29.0   Platelets 150 - 400 K/uL 161  175  204     Lab Results  Component Value Date   NA 138 10/09/2021   K 3.9 10/09/2021   CL 104 10/09/2021   CO2 27 10/09/2021      Assessment/Plan: Acute hypoxic respiratory failure due to recurrent multifocal pneumonia: Appears relatively stable-however significant worsening radiologically-with near complete opacification of left lung.  BAL cultures negative so far.  Unclear why he would have significant worsening-while on cefepime-?  Unwitnessed aspiration episode.  Overall fairly stable overnight-remains on just 2 L of oxygen-continue steroids-cefepime x7 days.  We will repeat and was of IV Lasix in an attempt to keep in negative balance.  History of recurrent pneumonia: Occurring since COVID-19 April 2021-apparently improved after course of antibiotics/steroids-s/p bronchoscopy on 6/7-see above.  History of interstitial lung disease: Suspected GERD contributing to his ILD.  Supportive care-on PPI.  Chronic HFpEF: Volume status stable-repeat IV Lasix today to ensure negative balance.  Will need to be cautious given history of orthostatic hypotension.  .  SVT: Continue beta-blocker-evaluated by cardiology-not felt to have A-fib-continue beta-blocker.  No longer on amiodarone.  History of PAF-s/p watchman's device 03/2019 and RFA with isolation of all 4 pulmonary veins in 2021: Continue telemetry monitoring.  Per cardiology note-not felt  to have A-fib.  No longer on anticoagulation with watchman's device in place.  History of CAD s/p multiple PCI's: Currently without any anginal symptoms.  Continue aspirin/Plavix/statin/beta-blocker.  History of ASD-s/p closure 01/22/2020  History  of moderate aortic stenosis: Stable for outpatient follow-up with cardiology.  History of CVA: No focal deficits.  Continue antiplatelets-statin.  History of sick sinus syndrome-s/p PPM placement: Continue telemetry monitoring.  History of vascular dementia: Appears to be mild-continue Namenda//Seroquel.  Maintain delirium precautions.  History of anxiety/depression: Continue Lexapro  History of orthostatic hypotension: Continue midodrine  GERD: Continue PPI  Obesity: Estimated body mass index is 34.03 kg/m as calculated from the following:   Height as of this encounter: _0  (1.854 m).   Weight as of this encounter: 117 kg.   Code status:   Code Status: Full Code   DVT Prophylaxis: enoxaparin (LOVENOX) injection 40 mg Start: 10/04/21 2200   Family Communication: Spouse-Annette-[phone] updated over the phone on 6/10   Disposition Plan: Status is: Inpatient Remains inpatient appropriate because: Recurrent PNA-awaiting BAL cultures-remains on empiric IV antibiotics.   Planned Discharge Destination:Home   Diet: Diet Order             Diet Heart Room service appropriate? Yes; Fluid consistency: Thin  Diet effective now                     Antimicrobial agents: Anti-infectives (From admission, onward)    Start     Dose/Rate Route Frequency Ordered Stop   10/05/21 1800  vancomycin (VANCOREADY) IVPB 1250 mg/250 mL  Status:  Discontinued        1,250 mg 166.7 mL/hr over 90 Minutes Intravenous Every 12 hours 10/05/21 0957 10/07/21 1200   10/04/21 0615  Vancomycin (VANCOCIN) 1,500 mg in sodium chloride 0.9 % 500 mL IVPB        1,500 mg 250 mL/hr over 120 Minutes Intravenous  Once 10/04/21 0600 10/04/21 0926   10/04/21 0605  vancomycin (VANCOCIN) 1000 MG powder       Note to Pharmacy: April Holding C: cabinet override      10/04/21 0605 10/04/21 1814   10/04/21 0605  vancomycin (VANCOCIN) 500 MG powder       Note to Pharmacy: April Holding C: cabinet  override      10/04/21 0605 10/04/21 1814   10/04/21 0400  vancomycin (VANCOREADY) IVPB 1500 mg/300 mL  Status:  Discontinued        1,500 mg 150 mL/hr over 120 Minutes Intravenous Every 12 hours 10/03/21 1357 10/05/21 0957   10/03/21 1445  vancomycin (VANCOCIN) IVPB 1000 mg/200 mL premix       See Hyperspace for full Linked Orders Report.   1,000 mg 200 mL/hr over 60 Minutes Intravenous  Once 10/03/21 1335 10/03/21 1938   10/03/21 1400  ceFEPIme (MAXIPIME) 2 g in sodium chloride 0.9 % 100 mL IVPB        2 g 200 mL/hr over 30 Minutes Intravenous Every 8 hours 10/03/21 1356 10/13/21 1359   10/03/21 1345  vancomycin (VANCOCIN) IVPB 1000 mg/200 mL premix       See Hyperspace for full Linked Orders Report.   1,000 mg 200 mL/hr over 60 Minutes Intravenous  Once 10/03/21 1335 10/03/21 1846   10/03/21 1330  vancomycin (VANCOCIN) IVPB 1000 mg/200 mL premix  Status:  Discontinued        1,000 mg 200 mL/hr over 60 Minutes Intravenous  Once 10/03/21 1321 10/03/21 1335   10/03/21 1330  ceFEPIme (MAXIPIME) 2 g in sodium chloride 0.9 % 100 mL IVPB  Status:  Discontinued        2 g 200 mL/hr over 30 Minutes Intravenous  Once 10/03/21 1321 10/03/21 1356   10/03/21 1330  azithromycin (ZITHROMAX) 500 mg in sodium chloride 0.9 % 250 mL IVPB        500 mg 250 mL/hr over 60 Minutes Intravenous  Once 10/03/21 1321 10/03/21 1710        MEDICATIONS: Scheduled Meds:  allopurinol  100 mg Oral Daily   aspirin EC  81 mg Oral q AM   atorvastatin  80 mg Oral Daily   bacitracin   Topical BID   chlorhexidine  15 mL Mouth/Throat Once   cholecalciferol  400 Units Oral Daily   clopidogrel  75 mg Oral Daily   enoxaparin (LOVENOX) injection  40 mg Subcutaneous Q24H   escitalopram  30 mg Oral Daily   fludrocortisone  0.1 mg Oral BID   guaiFENesin  600 mg Oral BID   hydrocortisone cream   Topical TID   latanoprost  1 drop Both Eyes QHS   lidocaine  1 patch Transdermal Q24H   magnesium oxide  400 mg Oral Daily    memantine  10 mg Oral BID   metoprolol tartrate  12.5 mg Oral BID   midodrine  5 mg Oral TID WC   mometasone-formoterol  2 puff Inhalation BID   ondansetron (ZOFRAN) IV  4 mg Intravenous Once   oxymetazoline  3 spray Each Nare BID   pantoprazole  40 mg Oral QPM   potassium citrate  10 mEq Oral Daily   predniSONE  40 mg Oral Q breakfast   primidone  100 mg Oral BID   QUEtiapine  100 mg Oral QHS   ranolazine  1,000 mg Oral BID   rOPINIRole  4 mg Oral QHS   Continuous Infusions:  sodium chloride 10 mL/hr at 10/04/21 0539   ceFEPime (MAXIPIME) IV 2 g (10/09/21 0519)   PRN Meds:.sodium chloride, acetaminophen, benzonatate, ipratropium-albuterol, oxyCODONE-acetaminophen, sodium chloride   I have personally reviewed following labs and imaging studies  LABORATORY DATA: CBC: Recent Labs  Lab 10/03/21 1505 10/04/21 1436 10/05/21 0805 10/06/21 0252 10/07/21 0449 10/09/21 0038  WBC 13.3* 10.6* 11.8* 13.0* 9.1 10.1  NEUTROABS 10.7*  --   --   --   --   --   HGB 10.4* 9.4* 10.2* 9.1* 8.7* 8.8*  HCT 30.8* 29.1* 31.1* 29.0* 27.1* 27.6*  MCV 100.7* 102.5* 102.0* 105.5* 104.2* 101.8*  PLT 209 188 190 204 175 161     Basic Metabolic Panel: Recent Labs  Lab 10/05/21 0805 10/06/21 0252 10/07/21 0449 10/08/21 0502 10/09/21 0038  NA 137 137 140 139 138  K 3.8 4.0 4.5 4.1 3.9  CL 108 107 109 111 104  CO2 21* 19* _0 GLUCOSE 182* 232* 114* 104* 124*  BUN 12 26* 28* 19 17  CREATININE 0.82 1.10 0.94 0.77 0.75  CALCIUM 8.5* 8.7* 8.7* 8.1* 8.3*  MG 1.9 2.0 2.1  --   --      GFR: Estimated Creatinine Clearance: 118.4 mL/min (by C-G formula based on SCr of 0.75 mg/dL).  Liver Function Tests: Recent Labs  Lab 10/03/21 1505  AST 26  ALT 20  ALKPHOS 56  BILITOT 1.0  PROT 7.1  ALBUMIN 2.8*    No results for input(s): "LIPASE", "AMYLASE" in the last 168 hours. No results for input(s): "AMMONIA" in the last 168  hours.  Coagulation Profile: Recent Labs  Lab  10/03/21 1505  INR 1.2     Cardiac Enzymes: No results for input(s): "CKTOTAL", "CKMB", "CKMBINDEX", "TROPONINI" in the last 168 hours.  BNP (last 3 results) Recent Labs    03/18/21 1004  PROBNP 370     Lipid Profile: No results for input(s): "CHOL", "HDL", "LDLCALC", "TRIG", "CHOLHDL", "LDLDIRECT" in the last 72 hours.  Thyroid Function Tests: No results for input(s): "TSH", "T4TOTAL", "FREET4", "T3FREE", "THYROIDAB" in the last 72 hours.  Anemia Panel: No results for input(s): "VITAMINB12", "FOLATE", "FERRITIN", "TIBC", "IRON", "RETICCTPCT" in the last 72 hours.  Urine analysis:    Component Value Date/Time   COLORURINE AMBER (A) 10/07/2021 2003   APPEARANCEUR CLEAR 10/07/2021 2003   LABSPEC 1.021 10/07/2021 2003   PHURINE 5.0 10/07/2021 2003   GLUCOSEU NEGATIVE 10/07/2021 2003   HGBUR NEGATIVE 10/07/2021 2003   [place] NEGATIVE 10/07/2021 2003   [place] NEGATIVE 10/07/2021 2003   PROTEINUR 30 (A) 10/07/2021 2003   NITRITE NEGATIVE 10/07/2021 2003   LEUKOCYTESUR NEGATIVE 10/07/2021 2003    Sepsis Labs: Lactic Acid, Venous    Component Value Date/Time   LATICACIDVEN 1.3 10/03/2021 1505    MICROBIOLOGY: Recent Results (from the past 240 hour(s))  Culture, blood (Routine x 2)     Status: None   Collection Time: 10/03/21  3:00 PM   Specimen: BLOOD LEFT ARM  Result Value Ref Range Status   Specimen Description   Final    BLOOD LEFT ARM BLOOD Performed at [facility], [address] [place], [place] [address]    Special Requests   Final    Blood Culture adequate volume BOTTLES DRAWN AEROBIC AND ANAEROBIC Performed at [facility], [address] [place], [place] [address]    Culture   Final    NO GROWTH 5 DAYS Performed at [facility], [place] [address] [place], [place] [address]    Report Status 10/08/2021 FINAL  Final  Resp Panel by RT-PCR (Flu A&B, Covid) Anterior Nasal Swab     Status: None   Collection Time:  10/03/21  3:05 PM   Specimen: Anterior Nasal Swab  Result Value Ref Range Status   SARS Coronavirus 2 by RT PCR NEGATIVE NEGATIVE Final    Comment: (NOTE) SARS-CoV-2 target nucleic acids are NOT DETECTED.  The SARS-CoV-2 RNA is generally detectable in upper respiratory specimens during the acute phase of infection. The lowest concentration of SARS-CoV-2 viral copies this assay can detect is 138 copies/mL. A negative result does not preclude SARS-Cov-2 infection and should not be used as the sole basis for treatment or other patient management decisions. A negative result may occur with  improper specimen collection/handling, submission of specimen other than nasopharyngeal swab, presence of viral mutation(s) within the areas targeted by this assay, and inadequate number of viral copies(<138 copies/mL). A negative result must be combined with clinical observations, patient history, and epidemiological information. The expected result is Negative.  Fact Sheet for Patients:  EntrepreneurPulse.com.au  Fact Sheet for Healthcare Providers:  IncredibleEmployment.be  This test is no t yet approved or cleared by the [place] FDA and  has been authorized for detection and/or diagnosis of SARS-CoV-2 by FDA under an Emergency Use Authorization (EUA). This EUA will remain  in effect (meaning this test can be used) for the duration of the COVID-19 declaration under Section 564(b)(1) of the Act, 21 U.S.C.section 360bbb-3(b)(1), unless the authorization is terminated  or revoked sooner.  Influenza A by PCR NEGATIVE NEGATIVE Final   Influenza B by PCR NEGATIVE NEGATIVE Final    Comment: (NOTE) The Xpert Xpress SARS-CoV-2/FLU/RSV plus assay is intended as an aid in the diagnosis of influenza from Nasopharyngeal swab specimens and should not be used as a sole basis for treatment. Nasal washings and aspirates are unacceptable for Xpert Xpress  SARS-CoV-2/FLU/RSV testing.  Fact Sheet for Patients: EntrepreneurPulse.com.au  Fact Sheet for Healthcare Providers: IncredibleEmployment.be  This test is not yet approved or cleared by the [place] FDA and has been authorized for detection and/or diagnosis of SARS-CoV-2 by FDA under an Emergency Use Authorization (EUA). This EUA will remain in effect (meaning this test can be used) for the duration of the COVID-19 declaration under Section 564(b)(1) of the Act, 21 U.S.C. section 360bbb-3(b)(1), unless the authorization is terminated or revoked.  Performed at [facility], [address] [place], [place] [address]   Culture, blood (Routine x 2)     Status: None   Collection Time: 10/04/21  2:37 PM   Specimen: BLOOD LEFT HAND  Result Value Ref Range Status   Specimen Description BLOOD LEFT HAND  Final   Special Requests   Final    BOTTLES DRAWN AEROBIC AND ANAEROBIC Blood Culture adequate volume   Culture   Final    NO GROWTH 5 DAYS Performed at [facility], [place] [address], [place], [place] [address]    Report Status 10/09/2021 FINAL  Final  Acid Fast Smear (AFB)     Status: None   Collection Time: 10/06/21 12:42 PM   Specimen: PATH Cytology Bronchial lavage; Body Fluid  Result Value Ref Range Status   AFB Specimen Processing Concentration  Final   Acid Fast Smear Negative  Final    Comment: (NOTE) Performed At: [facility] [place], [place] [id number] Rush Farmer MD WL:[id number]    Source (AFB) BRONCHIAL ALVEOLAR LAVAGE  Final    Comment: LINGULA Performed at [facility], [place] [address], [place], [place] [address]   Culture, BAL-quantitative w Gram Stain     Status: None   Collection Time: 10/06/21 12:42 PM   Specimen: Bronchoalveolar Lavage  Result Value Ref Range Status   Specimen Description BRONCHIAL ALVEOLAR LAVAGE  Final   Special Requests LINGULA  Final   Gram Stain    Final    RARE WBC PRESENT,BOTH PMN AND MONONUCLEAR NO ORGANISMS SEEN    Culture   Final    NO GROWTH 2 DAYS Performed at [facility], [place] [address], [place], [place] [address]    Report Status 10/08/2021 FINAL  Final    RADIOLOGY STUDIES/RESULTS: CT CHEST WO CONTRAST  Result Date: 10/08/2021 CLINICAL DATA:  Evaluate pneumonia. EXAM: CT CHEST WITHOUT CONTRAST TECHNIQUE: Multidetector CT imaging of the chest was performed following the standard protocol without IV contrast. RADIATION DOSE REDUCTION: This exam was performed according to the departmental dose-optimization program which includes automated exposure control, adjustment of the mA and/or kV according to patient size and/or use of iterative reconstruction technique. COMPARISON:  06/22/2021 FINDINGS: Cardiovascular: Mild cardiac enlargement. No pericardial effusion. Aortic atherosclerosis and multi vessel coronary artery calcification. Left chest wall pacer is noted with leads in the right atrial appendage and right ventricle. Mediastinum/Nodes: Thyroid gland and trachea appear unremarkable. Mild circumferential wall thickening of the distal esophagus is noted. Small to moderate hiatal hernia noted. Multiple prominent (non pathologically enlarged) mediastinal lymph nodes are identified and appear increased  in size from previous exam. For example, index right paratracheal lymph node measures 1.2 cm, image 19/3. Previously 0.9 cm. Left paratracheal lymph node measures 1.1 cm, image 28/3. Previously 0.6 cm. Lungs/Pleura: Small bilateral pleural effusions are identified, new from previous exam. There is a background interstitial abnormality with lower lung zone predominant interstitial reticulation and septal thickening. Extensive airspace and ground-glass disease is identified throughout the left upper and lower lobes which is concerning for multifocal infection. Posterior and medial right upper lobe ground-glass and airspace opacification  is also identified and concerning for inflammation/infection. Patchy ground-glass attenuation is noted within the right middle lobe. Within the right lower lobe there is increased soft tissue thickening of the peribronchovascular interstitium. Upper Abdomen: No acute abnormality. Benign Bosniak class 1 cyst arises off the upper pole of the right kidney measuring 2 cm. No follow-up recommended. Musculoskeletal: No chest wall mass or suspicious bone lesions identified. IMPRESSION: 1. Extensive airspace and ground-glass disease identified throughout the left upper and lower lobes as well as posterior and medial right upper lobe. Findings are concerning for multifocal infection. 2. Small bilateral pleural effusions, new from previous exam. 3. Small to moderate hiatal hernia with mild circumferential wall thickening of the distal esophagus. Correlate for any clinical signs or symptoms of esophagitis. 4. Increased size of mediastinal lymph nodes which is likely reactive in the setting of multifocal pneumonia. 5. Coronary artery calcifications. 6. Aortic Atherosclerosis (ICD10-I70.0). Electronically Signed   By: Kerby Moors M.D.   On: 10/08/2021 09:43   DG Chest Port 1 View  Result Date: 10/08/2021 CLINICAL DATA:  Shortness of breath. EXAM: PORTABLE CHEST 1 VIEW COMPARISON:  Radiograph October 03, 2021 FINDINGS: Left chest pacemaker with leads overlying the right atrium right ventricle. The heart size and mediastinal contours are obscured. Significant increase in the left lung opacities now with near complete opacification. Mild right-sided interstitial thickening. No acute osseous abnormality. IMPRESSION: Significant interval increase in the left lung opacities now with near complete opacification concern for worsening pneumonia. Mild right-sided interstitial thickening. Electronically Signed   By: Dahlia Bailiff M.D.   On: 10/08/2021 08:36     LOS: 5 days   Oren Binet, MD  [facility]    To contact  the attending provider between 7A-7P or the covering provider during after hours 7P-7A, please log into the web site [email or web address] and access using universal [place] password for that web site. If you do not have the password, please call the hospital operator.  10/09/2021, 1:35 PM

## 2021-10-10 DIAGNOSIS — J189 Pneumonia, unspecified organism: Secondary | ICD-10-CM | POA: Diagnosis not present

## 2021-10-10 DIAGNOSIS — Z8673 Personal history of transient ischemic attack (TIA), and cerebral infarction without residual deficits: Secondary | ICD-10-CM | POA: Diagnosis not present

## 2021-10-10 DIAGNOSIS — I2510 Atherosclerotic heart disease of native coronary artery without angina pectoris: Secondary | ICD-10-CM | POA: Diagnosis not present

## 2021-10-10 DIAGNOSIS — J9601 Acute respiratory failure with hypoxia: Secondary | ICD-10-CM | POA: Diagnosis not present

## 2021-10-10 LAB — CREATININE, SERUM
Creatinine, Ser: 0.92 mg/dL (ref 0.61–1.24)
GFR, Estimated: >60 mL/min (ref >60)

## 2021-10-10 MED ORDER — MIDODRINE HCL 5 MG PO TABS: 5.0000 mg | ORAL_TABLET | Freq: Three times a day (TID) | ORAL | Status: DC

## 2021-10-10 MED ORDER — OXYCODONE-ACETAMINOPHEN 5-325 MG PO TABS
1.0000 | ORAL_TABLET | Freq: Four times a day (QID) | ORAL | Status: DC | PRN
  Administered 2021-10-10 – 2021-10-14 (×16): 1 via ORAL
  Filled 2021-10-10 (×17): qty 1

## 2021-10-10 MED ORDER — METOPROLOL TARTRATE 25 MG PO TABS
25.0000 mg | ORAL_TABLET | Freq: Two times a day (BID) | ORAL | Status: DC
  Administered 2021-10-10 – 2021-10-11 (×3): 25 mg via ORAL
  Filled 2021-10-10 (×4): qty 1

## 2021-10-10 MED ORDER — FLUDROCORTISONE ACETATE 0.1 MG PO TABS
0.1000 mg | ORAL_TABLET | Freq: Two times a day (BID) | ORAL | Status: DC
  Filled 2021-10-10: qty 1

## 2021-10-10 MED ORDER — HYDRALAZINE HCL 20 MG/ML IJ SOLN
10.0000 mg | Freq: Four times a day (QID) | INTRAMUSCULAR | Status: DC | PRN
  Filled 2021-10-10: qty 1

## 2021-10-10 MED ORDER — POLYETHYLENE GLYCOL 3350 17 G PO PACK
17.0000 g | PACK | Freq: Two times a day (BID) | ORAL | Status: DC
  Administered 2021-10-10 – 2021-10-14 (×9): 17 g via ORAL
  Filled 2021-10-10 (×9): qty 1

## 2021-10-10 MED ORDER — FUROSEMIDE 10 MG/ML IJ SOLN
20.0000 mg | Freq: Once | INTRAMUSCULAR | Status: AC
  Administered 2021-10-10: 20 mg via INTRAVENOUS
  Filled 2021-10-10: qty 2

## 2021-10-10 NOTE — Progress Notes (Signed)
PROGRESS NOTE        PATIENT DETAILS Name: Mark Thomas Age: 71 y.o. Sex: male Date of Birth: 1950-09-05 Admit Date: 10/03/2021 Admitting Physician Bonnell Public, MD HJS:[id number], Einar Pheasant, DO  Brief Summary: Patient is a 71 y.o.  male history of CAD-s/p PCI PAF-s/p watchman's device, CVA who has numerous hospitalizations for recurrent PNA-presenting with SOB-found to have hypoxia due to multifocal PNA and subsequently admitted to the hospitalist service.    Significant events: 4/05-4/10>> hospitalization for PNA 4/27-4/30>> hospitalization for sepsis/hypoxia due to PNA. 6/04>> admit to [facility]-due to pneumonia.  Significant studies: 4/28>> TTE: EF 96-88%, grade 2 diastolic dysfunction, RVSP 49.5, moderate aortic stenosis 6/04>> CXR: Worsening opacities in the left lung 6/09>> CXR: Significant interval worsening of left lung opacities-near complete opacification of left lung. 6/09>> CT chest: Extensive airspace disease throughout left lung   Significant microbiology data: 6/04>> COVID/INFLUENZA PCR: Negative 6/04>> blood culture: Negative 6/07>> BAL culture: Negative  6/07>> BAL AFB smear: Negative 6/07>> BAL AFB culture: Pending 6/07>> BAL fungal culture: Pending 6/07>> BAL cytology: No malignant cells identified  Procedures: 6/07>> bronchoscopy  Consults: PCCM  Subjective: Anxious-asking me if he is going to die-continues to ask for narcotics-wants to to be changed to every 6 hours.  Blood pressure high today.  Claims Lidoderm patch does not work well.  Objective: Vitals: Blood pressure (!) 175/112, pulse 85, temperature 98.5 F (36.9 C), temperature source Oral, resp. rate 20, height _0  (1.854 m), weight 117 kg, SpO2 94 %.   Exam: Gen Exam:Alert awake-not in any distress HEENT:atraumatic, normocephalic Chest: B/L clear to auscultation anteriorly CVS:S1S2 regular Abdomen:soft non tender, non distended Extremities:no  edema Neurology: Non focal Skin: no rash    Pertinent Labs/Radiology:    Latest Ref Rng & Units 10/09/2021   12:38 AM 10/07/2021    4:49 AM 10/06/2021    2:52 AM  CBC  WBC 4.0 - 10.5 K/uL 10.1  9.1  13.0   Hemoglobin 13.0 - 17.0 g/dL 8.8  8.7  9.1   Hematocrit 39.0 - 52.0 % 27.6  27.1  29.0   Platelets 150 - 400 K/uL 161  175  204     Lab Results  Component Value Date   NA 138 10/09/2021   K 3.9 10/09/2021   CL 104 10/09/2021   CO2 27 10/09/2021      Assessment/Plan: Acute hypoxic respiratory failure due to recurrent multifocal pneumonia: Appears relatively stable-however significant worsening radiologically-with near complete opacification of left lung.  BAL cultures negative so far.  Unclear why he would have significant worsening-while on cefepime-?  Unwitnessed aspiration episode.  Overall fairly stable-on just 2 L of oxygen-continue steroids-Per PCCM recommendations will be on cefepime 7-10 days.  Repeat 1 dose of IV Lasix today-keep negative balance.    History of recurrent pneumonia: Occurring since COVID-19 April 2021-apparently improved after course of antibiotics/steroids-s/p bronchoscopy on 6/7-see above.  History of interstitial lung disease: Suspected GERD contributing to his ILD.  Supportive care-on PPI.  Chronic HFpEF: Volume status stable-repeat IV Lasix today to ensure negative balance.  Will need to be cautious given history of orthostatic hypotension.  .  SVT: Continue beta-blocker-evaluated by cardiology-not felt to have A-fib-continue beta-blocker.  No longer on amiodarone.  History of PAF-s/p watchman's device 03/2019 and RFA with isolation of all 4 pulmonary veins in 2021: Continue telemetry monitoring.  Per cardiology note-not felt to have A-fib.  No longer on anticoagulation with watchman's device in place.  History of CAD s/p multiple PCI's: Currently without any anginal symptoms.  Continue aspirin/Plavix/statin/beta-blocker.  History of ASD-s/p closure  01/22/2020  History of moderate aortic stenosis: Stable for outpatient follow-up with cardiology.  History of CVA: No focal deficits.  Continue antiplatelets-statin.  History of sick sinus syndrome-s/p PPM placement: Continue telemetry monitoring.  History of vascular dementia: Appears to be mild-continue Namenda//Seroquel.  Maintain delirium precautions.  History of anxiety/depression: Continue Lexapro  History of orthostatic hypotension: Continue midodrine/Florinef-but will hold today given increasing BP-likely from anxiety.  GERD: Continue PPI  Obesity: Estimated body mass index is 34.03 kg/m as calculated from the following:   Height as of this encounter: _0  (1.854 m).   Weight as of this encounter: 117 kg.   Code status:   Code Status: Full Code   DVT Prophylaxis: enoxaparin (LOVENOX) injection 40 mg Start: 10/04/21 2200   Family Communication: Spouse-Annette-[phone] updated over the phone on 6/11   Disposition Plan: Status is: Inpatient Remains inpatient appropriate because: Recurrent PNA-awaiting BAL cultures-remains on empiric IV antibiotics.   Planned Discharge Destination:Home   Diet: Diet Order             Diet Heart Room service appropriate? Yes; Fluid consistency: Thin  Diet effective now                     Antimicrobial agents: Anti-infectives (From admission, onward)    Start     Dose/Rate Route Frequency Ordered Stop   10/05/21 1800  vancomycin (VANCOREADY) IVPB 1250 mg/250 mL  Status:  Discontinued        1,250 mg 166.7 mL/hr over 90 Minutes Intravenous Every 12 hours 10/05/21 0957 10/07/21 1200   10/04/21 0615  Vancomycin (VANCOCIN) 1,500 mg in sodium chloride 0.9 % 500 mL IVPB        1,500 mg 250 mL/hr over 120 Minutes Intravenous  Once 10/04/21 0600 10/04/21 0926   10/04/21 0605  vancomycin (VANCOCIN) 1000 MG powder       Note to Pharmacy: April Holding C: cabinet override      10/04/21 0605 10/04/21 1814   10/04/21 0605   vancomycin (VANCOCIN) 500 MG powder       Note to Pharmacy: April Holding C: cabinet override      10/04/21 0605 10/04/21 1814   10/04/21 0400  vancomycin (VANCOREADY) IVPB 1500 mg/300 mL  Status:  Discontinued        1,500 mg 150 mL/hr over 120 Minutes Intravenous Every 12 hours 10/03/21 1357 10/05/21 0957   10/03/21 1445  vancomycin (VANCOCIN) IVPB 1000 mg/200 mL premix       See Hyperspace for full Linked Orders Report.   1,000 mg 200 mL/hr over 60 Minutes Intravenous  Once 10/03/21 1335 10/03/21 1938   10/03/21 1400  ceFEPIme (MAXIPIME) 2 g in sodium chloride 0.9 % 100 mL IVPB        2 g 200 mL/hr over 30 Minutes Intravenous Every 8 hours 10/03/21 1356 10/13/21 1359   10/03/21 1345  vancomycin (VANCOCIN) IVPB 1000 mg/200 mL premix       See Hyperspace for full Linked Orders Report.   1,000 mg 200 mL/hr over 60 Minutes Intravenous  Once 10/03/21 1335 10/03/21 1846   10/03/21 1330  vancomycin (VANCOCIN) IVPB 1000 mg/200 mL premix  Status:  Discontinued        1,000 mg 200 mL/hr over 60  Minutes Intravenous  Once 10/03/21 1321 10/03/21 1335   10/03/21 1330  ceFEPIme (MAXIPIME) 2 g in sodium chloride 0.9 % 100 mL IVPB  Status:  Discontinued        2 g 200 mL/hr over 30 Minutes Intravenous  Once 10/03/21 1321 10/03/21 1356   10/03/21 1330  azithromycin (ZITHROMAX) 500 mg in sodium chloride 0.9 % 250 mL IVPB        500 mg 250 mL/hr over 60 Minutes Intravenous  Once 10/03/21 1321 10/03/21 1710        MEDICATIONS: Scheduled Meds:  albuterol  2.5 mg Nebulization BID   allopurinol  100 mg Oral Daily   aspirin EC  81 mg Oral q AM   atorvastatin  80 mg Oral Daily   bacitracin   Topical BID   chlorhexidine  15 mL Mouth/Throat Once   cholecalciferol  400 Units Oral Daily   clopidogrel  75 mg Oral Daily   enoxaparin (LOVENOX) injection  40 mg Subcutaneous Q24H   escitalopram  30 mg Oral Daily   [START ON 10/11/2021] fludrocortisone  0.1 mg Oral BID   guaiFENesin  600 mg Oral BID    hydrocortisone cream   Topical TID   latanoprost  1 drop Both Eyes QHS   lidocaine  1 patch Transdermal Q24H   magnesium oxide  400 mg Oral Daily   memantine  10 mg Oral BID   metoprolol tartrate  25 mg Oral BID   [START ON 10/11/2021] midodrine  5 mg Oral TID WC   mometasone-formoterol  2 puff Inhalation BID   ondansetron (ZOFRAN) IV  4 mg Intravenous Once   oxymetazoline  3 spray Each Nare BID   pantoprazole  40 mg Oral QPM   potassium citrate  10 mEq Oral Daily   predniSONE  40 mg Oral Q breakfast   primidone  100 mg Oral BID   QUEtiapine  100 mg Oral QHS   ranolazine  1,000 mg Oral BID   rOPINIRole  4 mg Oral QHS   Continuous Infusions:  sodium chloride 10 mL/hr at 10/10/21 0629   ceFEPime (MAXIPIME) IV 200 mL/hr at 10/10/21 0629   PRN Meds:.sodium chloride, acetaminophen, benzonatate, hydrALAZINE, ipratropium-albuterol, oxyCODONE-acetaminophen, sodium chloride   I have personally reviewed following labs and imaging studies  LABORATORY DATA: CBC: Recent Labs  Lab 10/03/21 1505 10/04/21 1436 10/05/21 0805 10/06/21 0252 10/07/21 0449 10/09/21 0038  WBC 13.3* 10.6* 11.8* 13.0* 9.1 10.1  NEUTROABS 10.7*  --   --   --   --   --   HGB 10.4* 9.4* 10.2* 9.1* 8.7* 8.8*  HCT 30.8* 29.1* 31.1* 29.0* 27.1* 27.6*  MCV 100.7* 102.5* 102.0* 105.5* 104.2* 101.8*  PLT 209 188 190 204 175 161     Basic Metabolic Panel: Recent Labs  Lab 10/05/21 0805 10/06/21 0252 10/07/21 0449 10/08/21 0502 10/09/21 0038 10/10/21 0545  NA 137 137 140 139 138  --   K 3.8 4.0 4.5 4.1 3.9  --   CL 108 107 109 111 104  --   CO2 21* 19* _0 --   GLUCOSE 182* 232* 114* 104* 124*  --   BUN 12 26* 28* 19 17  --   CREATININE 0.82 1.10 0.94 0.77 0.75 0.92  CALCIUM 8.5* 8.7* 8.7* 8.1* 8.3*  --   MG 1.9 2.0 2.1  --   --   --      GFR: Estimated Creatinine Clearance: 102.9 mL/min (by C-G formula based  on SCr of 0.92 mg/dL).  Liver Function Tests: Recent Labs  Lab 10/03/21 1505  AST  26  ALT 20  ALKPHOS 56  BILITOT 1.0  PROT 7.1  ALBUMIN 2.8*    No results for input(s): "LIPASE", "AMYLASE" in the last 168 hours. No results for input(s): "AMMONIA" in the last 168 hours.  Coagulation Profile: Recent Labs  Lab 10/03/21 1505  INR 1.2     Cardiac Enzymes: No results for input(s): "CKTOTAL", "CKMB", "CKMBINDEX", "TROPONINI" in the last 168 hours.  BNP (last 3 results) Recent Labs    03/18/21 1004  PROBNP 370     Lipid Profile: No results for input(s): "CHOL", "HDL", "LDLCALC", "TRIG", "CHOLHDL", "LDLDIRECT" in the last 72 hours.  Thyroid Function Tests: No results for input(s): "TSH", "T4TOTAL", "FREET4", "T3FREE", "THYROIDAB" in the last 72 hours.  Anemia Panel: No results for input(s): "VITAMINB12", "FOLATE", "FERRITIN", "TIBC", "IRON", "RETICCTPCT" in the last 72 hours.  Urine analysis:    Component Value Date/Time   COLORURINE AMBER (A) 10/07/2021 2003   APPEARANCEUR CLEAR 10/07/2021 2003   LABSPEC 1.021 10/07/2021 2003   PHURINE 5.0 10/07/2021 2003   GLUCOSEU NEGATIVE 10/07/2021 2003   HGBUR NEGATIVE 10/07/2021 2003   [place] NEGATIVE 10/07/2021 2003   [place] NEGATIVE 10/07/2021 2003   PROTEINUR 30 (A) 10/07/2021 2003   NITRITE NEGATIVE 10/07/2021 2003   LEUKOCYTESUR NEGATIVE 10/07/2021 2003    Sepsis Labs: Lactic Acid, Venous    Component Value Date/Time   LATICACIDVEN 1.3 10/03/2021 1505    MICROBIOLOGY: Recent Results (from the past 240 hour(s))  Culture, blood (Routine x 2)     Status: None   Collection Time: 10/03/21  3:00 PM   Specimen: BLOOD LEFT ARM  Result Value Ref Range Status   Specimen Description   Final    BLOOD LEFT ARM BLOOD Performed at [facility], [address] [place], [place] [address]    Special Requests   Final    Blood Culture adequate volume BOTTLES DRAWN AEROBIC AND ANAEROBIC Performed at [facility], [address] [place], [place] [address]    Culture   Final     NO GROWTH 5 DAYS Performed at [facility], [place] [address], [place], [place] [address]    Report Status 10/08/2021 FINAL  Final  Resp Panel by RT-PCR (Flu A&B, Covid) Anterior Nasal Swab     Status: None   Collection Time: 10/03/21  3:05 PM   Specimen: Anterior Nasal Swab  Result Value Ref Range Status   SARS Coronavirus 2 by RT PCR NEGATIVE NEGATIVE Final    Comment: (NOTE) SARS-CoV-2 target nucleic acids are NOT DETECTED.  The SARS-CoV-2 RNA is generally detectable in upper respiratory specimens during the acute phase of infection. The lowest concentration of SARS-CoV-2 viral copies this assay can detect is 138 copies/mL. A negative result does not preclude SARS-Cov-2 infection and should not be used as the sole basis for treatment or other patient management decisions. A negative result may occur with  improper specimen collection/handling, submission of specimen other than nasopharyngeal swab, presence of viral mutation(s) within the areas targeted by this assay, and inadequate number of viral copies(<138 copies/mL). A negative result must be combined with clinical observations, patient history, and epidemiological information. The expected result is Negative.  Fact Sheet for Patients:  EntrepreneurPulse.com.au  Fact Sheet for Healthcare Providers:  IncredibleEmployment.be  This test is no t yet approved or cleared by the [place] and  has been authorized for detection and/or diagnosis of SARS-CoV-2 by FDA under an Emergency Use Authorization (EUA). This EUA will remain  in effect (meaning this test can be used) for the duration of the COVID-19 declaration under Section 564(b)(1) of the Act, 21 U.S.C.section 360bbb-3(b)(1), unless the authorization is terminated  or revoked sooner.       Influenza A by PCR NEGATIVE NEGATIVE Final   Influenza B by PCR NEGATIVE NEGATIVE Final    Comment: (NOTE) The Xpert Xpress  SARS-CoV-2/FLU/RSV plus assay is intended as an aid in the diagnosis of influenza from Nasopharyngeal swab specimens and should not be used as a sole basis for treatment. Nasal washings and aspirates are unacceptable for Xpert Xpress SARS-CoV-2/FLU/RSV testing.  Fact Sheet for Patients: EntrepreneurPulse.com.au  Fact Sheet for Healthcare Providers: IncredibleEmployment.be  This test is not yet approved or cleared by the [place] FDA and has been authorized for detection and/or diagnosis of SARS-CoV-2 by FDA under an Emergency Use Authorization (EUA). This EUA will remain in effect (meaning this test can be used) for the duration of the COVID-19 declaration under Section 564(b)(1) of the Act, 21 U.S.C. section 360bbb-3(b)(1), unless the authorization is terminated or revoked.  Performed at [facility], [address] [place], [place] [address]   Culture, blood (Routine x 2)     Status: None   Collection Time: 10/04/21  2:37 PM   Specimen: BLOOD LEFT HAND  Result Value Ref Range Status   Specimen Description BLOOD LEFT HAND  Final   Special Requests   Final    BOTTLES DRAWN AEROBIC AND ANAEROBIC Blood Culture adequate volume   Culture   Final    NO GROWTH 5 DAYS Performed at [facility], [place] [address] [place], [place] [address]    Report Status 10/09/2021 FINAL  Final  Acid Fast Smear (AFB)     Status: None   Collection Time: 10/06/21 12:42 PM   Specimen: PATH Cytology Bronchial lavage; Body Fluid  Result Value Ref Range Status   AFB Specimen Processing Concentration  Final   Acid Fast Smear Negative  Final    Comment: (NOTE) Performed At: [facility] [place], [place] [id number] Rush Farmer MD BE:[id number]    Source (AFB) BRONCHIAL ALVEOLAR LAVAGE  Final    Comment: LINGULA Performed at [facility], [place] [address] [place], [place] [address]   Culture, BAL-quantitative w  Gram Stain     Status: None   Collection Time: 10/06/21 12:42 PM   Specimen: Bronchoalveolar Lavage  Result Value Ref Range Status   Specimen Description BRONCHIAL ALVEOLAR LAVAGE  Final   Special Requests LINGULA  Final   Gram Stain   Final    RARE WBC PRESENT,BOTH PMN AND MONONUCLEAR NO ORGANISMS SEEN    Culture   Final    NO GROWTH 2 DAYS Performed at [facility], [place] [address] [place], [place] [address]    Report Status 10/08/2021 FINAL  Final    RADIOLOGY STUDIES/RESULTS: No results found.   LOS: 6 days   Oren Binet, MD  [facility]    To contact the attending provider between 7A-7P or the covering provider during after hours 7P-7A, please log into the web site [email or web address] and access using universal [place] password for that web site. If you do not have the password, please call the hospital operator.  10/10/2021, 11:07 AM

## 2021-10-11 ENCOUNTER — Telehealth: Payer: Self-pay | Admitting: Physician Assistant

## 2021-10-11 DIAGNOSIS — J9601 Acute respiratory failure with hypoxia: Secondary | ICD-10-CM | POA: Diagnosis not present

## 2021-10-11 DIAGNOSIS — Z8673 Personal history of transient ischemic attack (TIA), and cerebral infarction without residual deficits: Secondary | ICD-10-CM | POA: Diagnosis not present

## 2021-10-11 DIAGNOSIS — J189 Pneumonia, unspecified organism: Secondary | ICD-10-CM | POA: Diagnosis not present

## 2021-10-11 DIAGNOSIS — I2510 Atherosclerotic heart disease of native coronary artery without angina pectoris: Secondary | ICD-10-CM | POA: Diagnosis not present

## 2021-10-11 LAB — BASIC METABOLIC PANEL
Anion gap: 10 (ref 5–15)
BUN: 26 mg/dL — ABNORMAL HIGH (ref 8–23)
CO2: 28 mmol/L (ref 22–32)
Calcium: 8.4 mg/dL — ABNORMAL LOW (ref 8.9–10.3)
Chloride: 98 mmol/L (ref 98–111)
Creatinine, Ser: 0.93 mg/dL (ref 0.61–1.24)
GFR, Estimated: >60 mL/min (ref >60)
Glucose, Bld: 151 mg/dL — ABNORMAL HIGH (ref 70–99)
Potassium: 3.5 mmol/L (ref 3.5–5.1)
Sodium: 136 mmol/L (ref 135–145)

## 2021-10-11 LAB — CBC
HCT: 28.9 % — ABNORMAL LOW (ref 39.0–52.0)
Hemoglobin: 9 g/dL — ABNORMAL LOW (ref 13.0–17.0)
MCH: 31.9 pg (ref 26.0–34.0)
MCHC: 31.1 g/dL (ref 30.0–36.0)
MCV: 102.5 fL — ABNORMAL HIGH (ref 80.0–100.0)
Platelets: 172 10*3/uL (ref 150–400)
RBC: 2.82 MIL/uL — ABNORMAL LOW (ref 4.22–5.81)
RDW: 15.2 % (ref 11.5–15.5)
WBC: 10.8 10*3/uL — ABNORMAL HIGH (ref 4.0–10.5)
nRBC: 0.9 % — ABNORMAL HIGH (ref 0.0–0.2)

## 2021-10-11 MED ORDER — METOPROLOL TARTRATE 12.5 MG HALF TABLET: 12.5000 mg | ORAL_TABLET | Freq: Two times a day (BID) | ORAL | Status: DC

## 2021-10-11 MED ORDER — MIDODRINE HCL 5 MG PO TABS
2.5000 mg | ORAL_TABLET | Freq: Three times a day (TID) | ORAL | Status: DC
  Administered 2021-10-11 – 2021-10-14 (×6): 2.5 mg via ORAL
  Filled 2021-10-11 (×9): qty 1

## 2021-10-11 MED ORDER — METOPROLOL TARTRATE 25 MG PO TABS
25.0000 mg | ORAL_TABLET | Freq: Two times a day (BID) | ORAL | Status: DC
  Administered 2021-10-11 – 2021-10-12 (×2): 25 mg via ORAL
  Filled 2021-10-11 (×2): qty 1

## 2021-10-11 MED ORDER — FUROSEMIDE 10 MG/ML IJ SOLN
20.0000 mg | Freq: Once | INTRAMUSCULAR | Status: AC
  Administered 2021-10-11: 20 mg via INTRAVENOUS
  Filled 2021-10-11: qty 2

## 2021-10-11 NOTE — Telephone Encounter (Signed)
Patient is schedule for HFU on 10/21/2021 at 11:30am- appointment reminder mailed to address on file- nothing further needed.

## 2021-10-11 NOTE — Progress Notes (Signed)
Physical Therapy Treatment Patient Details Name: Mark Thomas MRN: [id number] DOB: 01-Apr-1951 Today's Date: 10/11/2021   History of Present Illness 71 y.o. male who presented to outside ED 10/04/21 with worsening shortness of breath and cough. recurrent pna (has had 2 recent hospitalizations for pna); +afib with RVR; 6/7 bronchoscopy with lavage; PMH includes: anxiety, CAD, depression, HTN, MI, stroke, vascular dementia, CKD, afib (not on anticoagulation), and orthostatic hypotension.    PT Comments    Pt received in supine, agreeable to therapy session and with fair participation and tolerance for bed mobility and transfer training. Pt BP hypertensive with transition to EOB and standing, RN notified, and pt c/o dizziness. Attempted to assess for nystagmus but difficult to assess due to pt internal distraction and dyspnea while seated, pt may benefit from vestibular consult if able to follow instructions following session. Standing tolerance limited due to pt verbalized fear of falls, he was able to transfer OOB to chair via lateral seated scooting after much encouragement, pt needing up to +2 minA for safety with functional mobility tasks. Per spouse, pt/family still preferring to dc home with HHPT, anticipate pt may have post-acute O2 needs, will continue to assess acutely, he was very dyspneic on 2L O2 this date.   Recommendations for follow up therapy are one component of a multi-disciplinary discharge planning process, led by the attending physician.  Recommendations may be updated based on patient status, additional functional criteria and insurance authorization.  Follow Up Recommendations  Home health PT     Assistance Recommended at Discharge Intermittent Supervision/Assistance  Patient can return home with the following Assistance with cooking/housework;Direct supervision/assist for medications management;Direct supervision/assist for financial management;A lot of help with walking  and/or transfers   Equipment Recommendations  None recommended by PT    Recommendations for Other Services OT consult     Precautions / Restrictions Precautions Precautions: Fall;Other (comment) Precaution Comments: watch for orthostasis, pt significantly anxious with any OOB activity (hx dementia) Restrictions Weight Bearing Restrictions: No     Mobility  Bed Mobility Overal bed mobility: Needs Assistance Bed Mobility: Supine to Sit     Supine to sit: Min assist     General bed mobility comments: cues for use of bed rail and min assist for trunk stability with increased time/effort to achieve upright.    Transfers Overall transfer level: Needs assistance Equipment used: Rolling walker (2 wheels) Transfers: Sit to/from Stand, Bed to chair/wheelchair/BSC Sit to Stand: Min assist          Lateral/Scoot Transfers: Min assist, +2 safety/equipment General transfer comment: cues for hand placement, steadying assist; pt significantly anxious upon standing and more dyspneic so refusing to perform stand pivot to recliner, pt agreeable to lateral scoot from bed>drop arm recliner chair after much coaxing from spouse and PTA.    Ambulation/Gait               General Gait Details: unable due to dizziness and feeling dyspneic   Stairs             Wheelchair Mobility    Modified Rankin (Stroke Patients Only)       Balance Overall balance assessment: Needs assistance   Sitting balance-Leahy Scale: Fair     Standing balance support: Bilateral upper extremity supported, During functional activity, Reliant on assistive device for balance Standing balance-Leahy Scale: Poor Standing balance comment: needs BUE support or external assist with U UE support when checking BP  Cognition Arousal/Alertness: Awake/alert Behavior During Therapy: Anxious, Flat affect Overall Cognitive Status: Impaired/Different from baseline Area  of Impairment: Orientation, Memory, Problem solving, Attention, Safety/judgement                 Orientation Level: Place, Time, Disoriented to Current Attention Level: Selective Memory: Decreased short-term memory   Safety/Judgement: Decreased awareness of safety   Problem Solving: Slow processing, Difficulty sequencing, Requires verbal cues, Requires tactile cues General Comments: Pt anxious regarding prospect of OOB mobility, participatory but self-limiting due to fear of falls. Pt spouse Anne Ng present and very supportive/encouraging.        Exercises      General Comments General comments (skin integrity, edema, etc.): see BP in comments above; RN notified of pt hypertension; pt desat to 90% when SpO2 decreased to 2L and dyspneic, sats and dypsnea improved when increased back to 3L [place]      Pertinent Vitals/Pain Pain Assessment Pain Assessment: Faces Faces Pain Scale: Hurts a little bit Pain Location: generalized with mobility Pain Descriptors / Indicators: Discomfort, Grimacing Pain Intervention(s): Monitored during session, Repositioned, Limited activity within patient's tolerance     PT Goals (current goals can now be found in the care plan section) Acute Rehab PT Goals Patient Stated Goal: breathe better and go home PT Goal Formulation: With patient/family Time For Goal Achievement: 10/21/21 Progress towards PT goals: Progressing toward goals    Frequency    Min 3X/week      PT Plan Current plan remains appropriate       AM-PAC PT "6 Clicks" Mobility   Outcome Measure  Help needed turning from your back to your side while in a flat bed without using bedrails?: A Little Help needed moving from lying on your back to sitting on the side of a flat bed without using bedrails?: A Little Help needed moving to and from a bed to a chair (including a wheelchair)?: A Lot (mod cues/encouragement to attempt and +2 assist) Help needed standing up from a chair  using your arms (e.g., wheelchair or bedside chair)?: A Little Help needed to walk in hospital room?: Total Help needed climbing 3-5 steps with a railing? : Total 6 Click Score: 13    End of Session Equipment Utilized During Treatment: Gait belt;Oxygen Activity Tolerance: Patient limited by fatigue;Treatment limited secondary to medical complications (Comment);Other (comment) (dyspnea, dizziness, fear of falls) Patient left: in chair;with call bell/phone within reach;with family/visitor present (pt spouse present, she will stay while pt is up in chair) Nurse Communication: Mobility status;Other (comment) (hypertensive and dyspneic with minimal exertion) PT Visit Diagnosis: History of falling (Z91.81);Difficulty in walking, not elsewhere classified (R26.2);Dizziness and giddiness (R42)     Time: 4696-2952 PT Time Calculation (min) (ACUTE ONLY): 25 min  Charges:  $Therapeutic Activity: 23-37 mins                     Tyrian Peart P., PTA Acute Rehabilitation Services Secure Chat Preferred 9a-5:30pm Office: [place] 10/11/2021, 6:48 PM

## 2021-10-11 NOTE — Progress Notes (Signed)
NAME:  Mark Thomas, MRN:  [id number], DOB:  28-Dec-1950, LOS: 7 ADMISSION DATE:  10/03/2021, CONSULTATION DATE: 10/05/2021 REFERRING MD: [facility], CHIEF COMPLAINT: Refractory pneumonia  History of Present Illness:  71 year old male who is a never smoker with history of hypertension coronary disease with a stent along with CVA, gastroesophageal reflux disease, chronic kidney disease with current creatinine .90.  He has a history of atrial fibrillation followed by cardiology along with a permanent pacemaker and currently being paced.  He reports recently being treated for pneumonia with antibiotics with some improvement but signs and symptoms of infection have returned he noticed increasing orthopnea and generalized failure to thrive with episode of hemoptysis without fevers chills or sweats.  He did had COVID in 12/22 and has had problems with respiratory difficulty since.  He has been followed in the office by Dr. Erin Fulling and pulmonary critical care has been asked to evaluate while in the hospital due to his ongoing symptoms and been refractory to outpatient treatment.  Pertinent  Medical History   Past Medical History:  Diagnosis Date   Anxiety    Coronary artery disease    Depression    Essential tremor    GERD (gastroesophageal reflux disease)    High cholesterol    Hypertension    Myocardial infarct (HCC)    Orthostatic hypotension    Stroke [facility])    Vascular dementia ([place])      Significant Hospital Events: Including procedures, antibiotic start and stop dates in addition to other pertinent events   6/6-pulmonary consult 6/7-bronchoscopy 6/9-chest x-ray much worse   Interim History / Subjective:  On 3L, vitals stable. Subjectively he feels a bit improved.  Objective   Blood pressure (!) 166/110, pulse 96, temperature 98.9 F (37.2 C), temperature source Oral, resp. rate 18, height _0  (1.854 m), weight 117 kg, SpO2 98 %.    FiO2 (%):  [32 %] 32 %   Intake/Output Summary  (Last 24 hours) at 10/11/2021 0812 Last data filed at 10/11/2021 0700 Gross per 24 hour  Intake 872.17 ml  Output 1925 ml  Net -1052.83 ml    Filed Weights   10/03/21 1229 10/06/21 0616 10/06/21 1154  Weight: 117.9 kg 117 kg 117 kg    Examination: General: Adult male, resting in bed, in NAD. Neuro: A&O x 3, no deficits. HEENT: [place]/AT. Sclerae anicteric. EOMI. Cardiovascular: RRR, 3/6 SEM.  Lungs: Respirations even and unlabored.  CTA bilaterally, No W/R/R. Abdomen: Obese. BS x 4, soft, NT/ND.  Musculoskeletal: No gross deformities, no edema.  Skin: Intact, warm, no rashes.   CT chest on 6/9 reviewed with extensive airspace disease bilaterally left greater than right   Assessment & Plan:  Recurrent pneumonia with hypoxemia Baseline mild interstitial lung disease CT reviewed with extensive consolidative changes mostly on the left consistent with multifocal pneumonia O2 requirements are stable BAL cultures to date have been negative Needs ambulatory desaturation study prior to discharge to assess for home O2 needs Continue cefepime x 10 days and steroids Steroid taper on discharge Will arrange for outpatient pulmonary follow up, have sent message to our office for assistance  GERD Continue on PPI He has had speech evaluation and barium swallow during prior admissions with no significant aspiration noted  Hemoptysis No more episodes of hemoptysis.  Continues on anticoagulation  Rest per primary team.    Montey Hora, PA - [facility] For pager details, please see AMION or use Epic chat  After  1900, please call [facility] for cross coverage needs 10/11/2021, 8:18 AM

## 2021-10-11 NOTE — Progress Notes (Signed)
PROGRESS NOTE        PATIENT DETAILS Name: Mark Thomas Age: 71 y.o. Sex: male Date of Birth: 1950/06/02 Admit Date: 10/03/2021 Admitting Physician Bonnell Public, MD XYB:[id number], Einar Pheasant, DO  Brief Summary: Patient is a 71 y.o.  male history of CAD-s/p PCI PAF-s/p watchman's device, CVA who has numerous hospitalizations for recurrent PNA-presenting with SOB-found to have hypoxia due to multifocal PNA and subsequently admitted to the hospitalist service.    Significant events: 4/05-4/10>> hospitalization for PNA 4/27-4/30>> hospitalization for sepsis/hypoxia due to PNA. 6/04>> admit to [facility]-due to pneumonia. 6/09>> significant radiological worsening-with complete opacification of left lung while on cefepime.  Started on steroids by PCCM.  Significant studies: 4/28>> TTE: EF 16-60%, grade 2 diastolic dysfunction, RVSP 49.5, moderate aortic stenosis 6/04>> CXR: Worsening opacities in the left lung 6/09>> CXR: Significant interval worsening of left lung opacities-near complete opacification of left lung. 6/09>> CT chest: Extensive airspace disease throughout left lung   Significant microbiology data: 6/04>> COVID/INFLUENZA PCR: Negative 6/04>> blood culture: Negative 6/07>> BAL culture: Negative  6/07>> BAL AFB smear: Negative 6/07>> BAL AFB culture: Pending 6/07>> BAL fungal culture: Pending 6/07>> BAL cytology: No malignant cells identified  Procedures: 6/07>> bronchoscopy  Consults: PCCM  Subjective: Claims he feels much better today.  Bilateral musculoskeletal chest pain is better after adjusting dosage of Percocet to every 6 hours.  Objective: Vitals: Blood pressure (!) 166/110, pulse (!) 101, temperature 98.9 F (37.2 C), temperature source Oral, resp. rate 18, height _0  (1.854 m), weight 117 kg, SpO2 98 %.   Exam: Gen Exam:Alert awake-not in any distress HEENT:atraumatic, normocephalic Chest: B/L clear to auscultation  anteriorly CVS:S1S2 regular Abdomen:soft non tender, non distended Extremities:no edema Neurology: Non focal Skin: no rash   Pertinent Labs/Radiology:    Latest Ref Rng & Units 10/11/2021   12:27 AM 10/09/2021   12:38 AM 10/07/2021    4:49 AM  CBC  WBC 4.0 - 10.5 K/uL 10.8  10.1  9.1   Hemoglobin 13.0 - 17.0 g/dL 9.0  8.8  8.7   Hematocrit 39.0 - 52.0 % 28.9  27.6  27.1   Platelets 150 - 400 K/uL 172  161  175     Lab Results  Component Value Date   NA 136 10/11/2021   K 3.5 10/11/2021   CL 98 10/11/2021   CO2 28 10/11/2021      Assessment/Plan: Acute hypoxic respiratory failure due to recurrent multifocal pneumonia: Overall stable-unclear why he worsened with complete opacification of left lung while on cefepime in the hospital-?  Had aspiration event.  Thankfully now he has improved-on just 2 L of O2-remains on steroids-plan is for 10 days of cefepime.  Repeat IV Lasix x1 today-ensure negative balance.  PCCM following.  Will need assessment for home O2 requirement at the time of discharge.  History of recurrent pneumonia: Occurring since COVID-19 April 2021-apparently improved after course of antibiotics/steroids-s/p bronchoscopy on 6/7-BAL cultures negative so far.  Unclear whether recurrent PNA was possible ILD flare.  History of interstitial lung disease: Suspected GERD contributing to his ILD.  Supportive care-on PPI.  Chronic HFpEF: Volume status stable-repeat IV Lasix today to ensure negative balance.  Will need to be cautious given history of orthostatic hypotension.   SVT: Continue beta-blocker-evaluated by cardiology-not felt to have A-fib-continue beta-blocker.  No longer on amiodarone.  History of  PAF-s/p watchman's device 03/2019 and RFA with isolation of all 4 pulmonary veins in 2021: Continue telemetry monitoring.  Per cardiology note-not felt to have A-fib.  No longer on anticoagulation with watchman's device in place.  History of CAD s/p multiple PCI's:  Currently without any anginal symptoms.  Continue aspirin/Plavix/statin/beta-blocker.  History of ASD-s/p closure 01/22/2020  History of moderate aortic stenosis: Stable for outpatient follow-up with cardiology.  History of CVA: No focal deficits.  Continue antiplatelets-statin.  History of sick sinus syndrome-s/p PPM placement: Continue telemetry monitoring.  History of vascular dementia: Appears to be mild-continue Namenda//Seroquel.  Maintain delirium precautions.  History of anxiety/depression: Continue Lexapro  History of orthostatic hypotension: Difficult situation-blood pressure has been fluctuating quite a bit-was persistently elevated in the 160s-170s range.  Have backed off dosage of midodrine to 2.5 mg 3 times daily-hold Florinef.  Follow closely.  GERD: Continue PPI  Obesity: Estimated body mass index is 34.03 kg/m as calculated from the following:   Height as of this encounter: _0  (1.854 m).   Weight as of this encounter: 117 kg.   Code status:   Code Status: Full Code   DVT Prophylaxis: enoxaparin (LOVENOX) injection 40 mg Start: 10/04/21 2200   Family Communication: Spouse-Annette-[phone] updated over the phone on 6/12   Disposition Plan: Status is: Inpatient Remains inpatient appropriate because: Recurrent PNA-needs IV cefepime x10 days total.   Planned Discharge Destination:Home   Diet: Diet Order             Diet Heart Room service appropriate? Yes; Fluid consistency: Thin  Diet effective now                     Antimicrobial agents: Anti-infectives (From admission, onward)    Start     Dose/Rate Route Frequency Ordered Stop   10/05/21 1800  vancomycin (VANCOREADY) IVPB 1250 mg/250 mL  Status:  Discontinued        1,250 mg 166.7 mL/hr over 90 Minutes Intravenous Every 12 hours 10/05/21 0957 10/07/21 1200   10/04/21 0615  Vancomycin (VANCOCIN) 1,500 mg in sodium chloride 0.9 % 500 mL IVPB        1,500 mg 250 mL/hr over 120 Minutes  Intravenous  Once 10/04/21 0600 10/04/21 0926   10/04/21 0605  vancomycin (VANCOCIN) 1000 MG powder       Note to Pharmacy: April Holding C: cabinet override      10/04/21 0605 10/04/21 1814   10/04/21 0605  vancomycin (VANCOCIN) 500 MG powder       Note to Pharmacy: April Holding C: cabinet override      10/04/21 0605 10/04/21 1814   10/04/21 0400  vancomycin (VANCOREADY) IVPB 1500 mg/300 mL  Status:  Discontinued        1,500 mg 150 mL/hr over 120 Minutes Intravenous Every 12 hours 10/03/21 1357 10/05/21 0957   10/03/21 1445  vancomycin (VANCOCIN) IVPB 1000 mg/200 mL premix       See Hyperspace for full Linked Orders Report.   1,000 mg 200 mL/hr over 60 Minutes Intravenous  Once 10/03/21 1335 10/03/21 1938   10/03/21 1400  ceFEPIme (MAXIPIME) 2 g in sodium chloride 0.9 % 100 mL IVPB        2 g 200 mL/hr over 30 Minutes Intravenous Every 8 hours 10/03/21 1356 10/13/21 1359   10/03/21 1345  vancomycin (VANCOCIN) IVPB 1000 mg/200 mL premix       See Hyperspace for full Linked Orders Report.   1,000 mg 200  mL/hr over 60 Minutes Intravenous  Once 10/03/21 1335 10/03/21 1846   10/03/21 1330  vancomycin (VANCOCIN) IVPB 1000 mg/200 mL premix  Status:  Discontinued        1,000 mg 200 mL/hr over 60 Minutes Intravenous  Once 10/03/21 1321 10/03/21 1335   10/03/21 1330  ceFEPIme (MAXIPIME) 2 g in sodium chloride 0.9 % 100 mL IVPB  Status:  Discontinued        2 g 200 mL/hr over 30 Minutes Intravenous  Once 10/03/21 1321 10/03/21 1356   10/03/21 1330  azithromycin (ZITHROMAX) 500 mg in sodium chloride 0.9 % 250 mL IVPB        500 mg 250 mL/hr over 60 Minutes Intravenous  Once 10/03/21 1321 10/03/21 1710        MEDICATIONS: Scheduled Meds:  albuterol  2.5 mg Nebulization BID   allopurinol  100 mg Oral Daily   aspirin EC  81 mg Oral q AM   atorvastatin  80 mg Oral Daily   bacitracin   Topical BID   cholecalciferol  400 Units Oral Daily   clopidogrel  75 mg Oral Daily   enoxaparin  (LOVENOX) injection  40 mg Subcutaneous Q24H   escitalopram  30 mg Oral Daily   guaiFENesin  600 mg Oral BID   hydrocortisone cream   Topical TID   latanoprost  1 drop Both Eyes QHS   lidocaine  1 patch Transdermal Q24H   magnesium oxide  400 mg Oral Daily   memantine  10 mg Oral BID   metoprolol tartrate  25 mg Oral BID   midodrine  2.5 mg Oral TID WC   mometasone-formoterol  2 puff Inhalation BID   ondansetron (ZOFRAN) IV  4 mg Intravenous Once   pantoprazole  40 mg Oral QPM   polyethylene glycol  17 g Oral BID   potassium citrate  10 mEq Oral Daily   predniSONE  40 mg Oral Q breakfast   primidone  100 mg Oral BID   QUEtiapine  100 mg Oral QHS   ranolazine  1,000 mg Oral BID   rOPINIRole  4 mg Oral QHS   Continuous Infusions:  sodium chloride 10 mL/hr at 10/10/21 1842   ceFEPime (MAXIPIME) IV 2 g (10/11/21 0623)   PRN Meds:.sodium chloride, acetaminophen, benzonatate, hydrALAZINE, ipratropium-albuterol, oxyCODONE-acetaminophen, sodium chloride   I have personally reviewed following labs and imaging studies  LABORATORY DATA: CBC: Recent Labs  Lab 10/05/21 0805 10/06/21 0252 10/07/21 0449 10/09/21 0038 10/11/21 0027  WBC 11.8* 13.0* 9.1 10.1 10.8*  HGB 10.2* 9.1* 8.7* 8.8* 9.0*  HCT 31.1* 29.0* 27.1* 27.6* 28.9*  MCV 102.0* 105.5* 104.2* 101.8* 102.5*  PLT 190 204 175 161 172     Basic Metabolic Panel: Recent Labs  Lab 10/05/21 0805 10/06/21 0252 10/07/21 0449 10/08/21 0502 10/09/21 0038 10/10/21 0545 10/11/21 0027  NA 137 137 140 139 138  --  136  K 3.8 4.0 4.5 4.1 3.9  --  3.5  CL 108 107 109 111 104  --  98  CO2 21* 19* _0 --  28  GLUCOSE 182* 232* 114* 104* 124*  --  151*  BUN 12 26* 28* 19 17  --  26*  CREATININE 0.82 1.10 0.94 0.77 0.75 0.92 0.93  CALCIUM 8.5* 8.7* 8.7* 8.1* 8.3*  --  8.4*  MG 1.9 2.0 2.1  --   --   --   --      GFR: Estimated Creatinine Clearance: 101.8  mL/min (by C-G formula based on SCr of 0.93 mg/dL).  Liver  Function Tests: No results for input(s): "AST", "ALT", "ALKPHOS", "BILITOT", "PROT", "ALBUMIN" in the last 168 hours.  No results for input(s): "LIPASE", "AMYLASE" in the last 168 hours. No results for input(s): "AMMONIA" in the last 168 hours.  Coagulation Profile: No results for input(s): "INR", "PROTIME" in the last 168 hours.   Cardiac Enzymes: No results for input(s): "CKTOTAL", "CKMB", "CKMBINDEX", "TROPONINI" in the last 168 hours.  BNP (last 3 results) Recent Labs    03/18/21 1004  PROBNP 370     Lipid Profile: No results for input(s): "CHOL", "HDL", "LDLCALC", "TRIG", "CHOLHDL", "LDLDIRECT" in the last 72 hours.  Thyroid Function Tests: No results for input(s): "TSH", "T4TOTAL", "FREET4", "T3FREE", "THYROIDAB" in the last 72 hours.  Anemia Panel: No results for input(s): "VITAMINB12", "FOLATE", "FERRITIN", "TIBC", "IRON", "RETICCTPCT" in the last 72 hours.  Urine analysis:    Component Value Date/Time   COLORURINE AMBER (A) 10/07/2021 2003   APPEARANCEUR CLEAR 10/07/2021 2003   LABSPEC 1.021 10/07/2021 2003   PHURINE 5.0 10/07/2021 2003   GLUCOSEU NEGATIVE 10/07/2021 2003   HGBUR NEGATIVE 10/07/2021 2003   [place] NEGATIVE 10/07/2021 2003   [place] NEGATIVE 10/07/2021 2003   PROTEINUR 30 (A) 10/07/2021 2003   NITRITE NEGATIVE 10/07/2021 2003   LEUKOCYTESUR NEGATIVE 10/07/2021 2003    Sepsis Labs: Lactic Acid, Venous    Component Value Date/Time   LATICACIDVEN 1.3 10/03/2021 1505    MICROBIOLOGY: Recent Results (from the past 240 hour(s))  Culture, blood (Routine x 2)     Status: None   Collection Time: 10/03/21  3:00 PM   Specimen: BLOOD LEFT ARM  Result Value Ref Range Status   Specimen Description   Final    BLOOD LEFT ARM BLOOD Performed at [facility], [address] [place], [place] [address]    Special Requests   Final    Blood Culture adequate volume BOTTLES DRAWN AEROBIC AND ANAEROBIC Performed at [facility], [address] [place], [place] [address]    Culture   Final    NO GROWTH 5 DAYS Performed at [facility], [place] [address], [place], [place] [address]    Report Status 10/08/2021 FINAL  Final  Resp Panel by RT-PCR (Flu A&B, Covid) Anterior Nasal Swab     Status: None   Collection Time: 10/03/21  3:05 PM   Specimen: Anterior Nasal Swab  Result Value Ref Range Status   SARS Coronavirus 2 by RT PCR NEGATIVE NEGATIVE Final    Comment: (NOTE) SARS-CoV-2 target nucleic acids are NOT DETECTED.  The SARS-CoV-2 RNA is generally detectable in upper respiratory specimens during the acute phase of infection. The lowest concentration of SARS-CoV-2 viral copies this assay can detect is 138 copies/mL. A negative result does not preclude SARS-Cov-2 infection and should not be used as the sole basis for treatment or other patient management decisions. A negative result may occur with  improper specimen collection/handling, submission of specimen other than nasopharyngeal swab, presence of viral mutation(s) within the areas targeted by this assay, and inadequate number of viral copies(<138 copies/mL). A negative result must be combined with clinical observations, patient history, and epidemiological information. The expected result is Negative.  Fact Sheet for Patients:  EntrepreneurPulse.com.au  Fact Sheet for Healthcare Providers:  IncredibleEmployment.be  This test is no t yet approved or cleared by the [place] FDA and  has been authorized for detection  and/or diagnosis of SARS-CoV-2 by FDA under an Emergency Use Authorization (EUA). This EUA will remain  in effect (meaning this test can be used) for the duration of the COVID-19 declaration under Section 564(b)(1) of the Act, 21 U.S.C.section 360bbb-3(b)(1), unless the authorization is terminated  or revoked sooner.       Influenza A by PCR NEGATIVE NEGATIVE Final   Influenza B by  PCR NEGATIVE NEGATIVE Final    Comment: (NOTE) The Xpert Xpress SARS-CoV-2/FLU/RSV plus assay is intended as an aid in the diagnosis of influenza from Nasopharyngeal swab specimens and should not be used as a sole basis for treatment. Nasal washings and aspirates are unacceptable for Xpert Xpress SARS-CoV-2/FLU/RSV testing.  Fact Sheet for Patients: EntrepreneurPulse.com.au  Fact Sheet for Healthcare Providers: IncredibleEmployment.be  This test is not yet approved or cleared by the [place] FDA and has been authorized for detection and/or diagnosis of SARS-CoV-2 by FDA under an Emergency Use Authorization (EUA). This EUA will remain in effect (meaning this test can be used) for the duration of the COVID-19 declaration under Section 564(b)(1) of the Act, 21 U.S.C. section 360bbb-3(b)(1), unless the authorization is terminated or revoked.  Performed at [facility], [address] [place], [place] [address]   Culture, blood (Routine x 2)     Status: None   Collection Time: 10/04/21  2:37 PM   Specimen: BLOOD LEFT HAND  Result Value Ref Range Status   Specimen Description BLOOD LEFT HAND  Final   Special Requests   Final    BOTTLES DRAWN AEROBIC AND ANAEROBIC Blood Culture adequate volume   Culture   Final    NO GROWTH 5 DAYS Performed at [facility], [place] [address] [place], [place] [address]    Report Status 10/09/2021 FINAL  Final  Acid Fast Smear (AFB)     Status: None   Collection Time: 10/06/21 12:42 PM   Specimen: PATH Cytology Bronchial lavage; Body Fluid  Result Value Ref Range Status   AFB Specimen Processing Concentration  Final   Acid Fast Smear Negative  Final    Comment: (NOTE) Performed At: [facility] [place], [place] [id number] Rush Farmer MD [id number]    Source (AFB) BRONCHIAL ALVEOLAR LAVAGE  Final    Comment: LINGULA Performed at [facility], [place] [address] [place], [place] [address]   Culture, BAL-quantitative w Gram Stain     Status: None   Collection Time: 10/06/21 12:42 PM   Specimen: Bronchoalveolar Lavage  Result Value Ref Range Status   Specimen Description BRONCHIAL ALVEOLAR LAVAGE  Final   Special Requests LINGULA  Final   Gram Stain   Final    RARE WBC PRESENT,BOTH PMN AND MONONUCLEAR NO ORGANISMS SEEN    Culture   Final    NO GROWTH 2 DAYS Performed at [facility], [place] [address] [place], [place] [address]    Report Status 10/08/2021 FINAL  Final    RADIOLOGY STUDIES/RESULTS: No results found.   LOS: 7 days   Oren Binet, MD  [facility]    To contact the attending provider between 7A-7P or the covering provider during after hours 7P-7A, please log into the web site [email or web address] and access using universal [place] password for that web site. If you do not have the password, please call the hospital operator.  10/11/2021, 11:39 AM

## 2021-10-12 ENCOUNTER — Inpatient Hospital Stay (HOSPITAL_COMMUNITY): Payer: Medicare Other

## 2021-10-12 DIAGNOSIS — J181 Lobar pneumonia, unspecified organism: Secondary | ICD-10-CM | POA: Diagnosis not present

## 2021-10-12 DIAGNOSIS — I2510 Atherosclerotic heart disease of native coronary artery without angina pectoris: Secondary | ICD-10-CM | POA: Diagnosis not present

## 2021-10-12 DIAGNOSIS — Z8673 Personal history of transient ischemic attack (TIA), and cerebral infarction without residual deficits: Secondary | ICD-10-CM | POA: Diagnosis not present

## 2021-10-12 DIAGNOSIS — R042 Hemoptysis: Secondary | ICD-10-CM | POA: Diagnosis not present

## 2021-10-12 DIAGNOSIS — J9601 Acute respiratory failure with hypoxia: Secondary | ICD-10-CM | POA: Diagnosis not present

## 2021-10-12 DIAGNOSIS — J189 Pneumonia, unspecified organism: Secondary | ICD-10-CM | POA: Diagnosis not present

## 2021-10-12 LAB — BASIC METABOLIC PANEL
Anion gap: 7 (ref 5–15)
BUN: 21 mg/dL (ref 8–23)
CO2: 30 mmol/L (ref 22–32)
Calcium: 8.4 mg/dL — ABNORMAL LOW (ref 8.9–10.3)
Chloride: 100 mmol/L (ref 98–111)
Creatinine, Ser: 0.98 mg/dL (ref 0.61–1.24)
GFR, Estimated: >60 mL/min (ref >60)
Glucose, Bld: 133 mg/dL — ABNORMAL HIGH (ref 70–99)
Potassium: 3.6 mmol/L (ref 3.5–5.1)
Sodium: 137 mmol/L (ref 135–145)

## 2021-10-12 IMAGING — DX DG CHEST 1V PORT
1 series · 1 of 1 positions shown · non-contrast
Comparison: [DATE].

CLINICAL DATA: Pneumonia.

EXAM:
PORTABLE CHEST 1 VIEW

[chest]
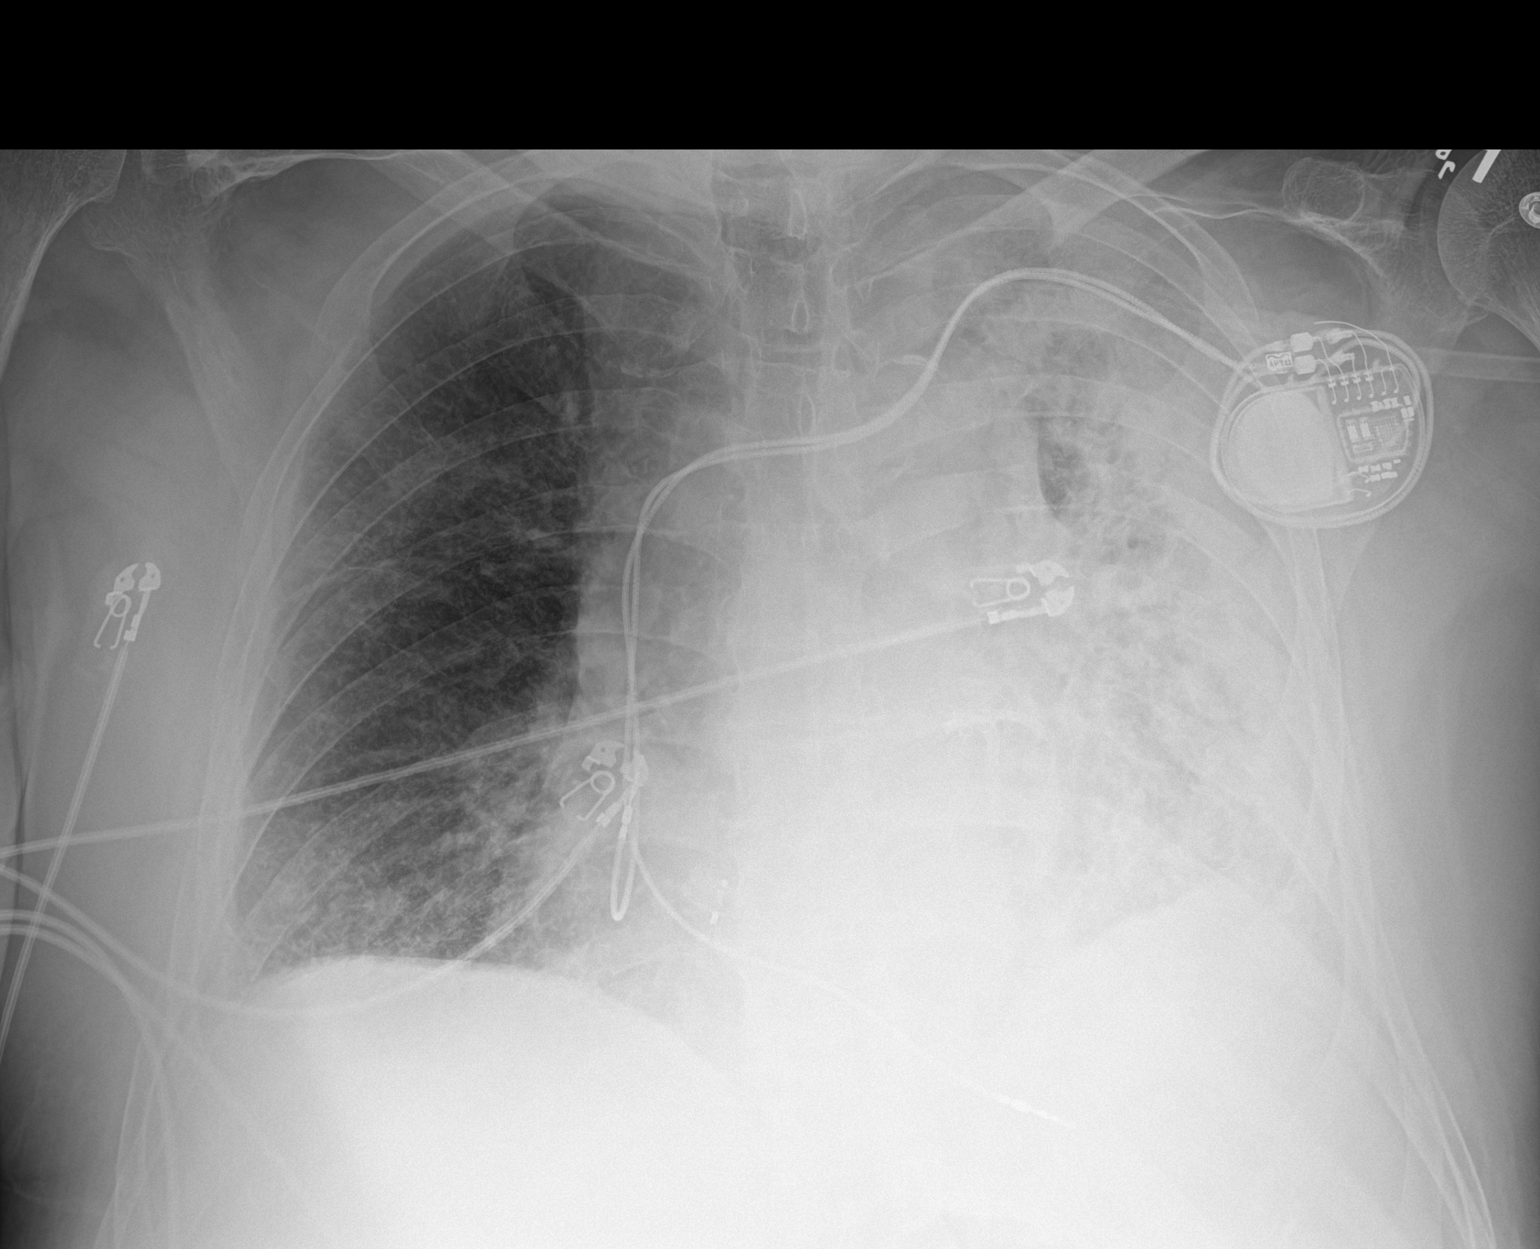

[1 of 1 positions shown; findings below may reference images not displayed]

FINDINGS: Stable cardiomediastinal silhouette. Left-sided pacemaker is
unchanged in position. Continued diffuse opacification of left lung
is noted consistent with pneumonia. Mild right basilar atelectasis
or infiltrate is noted. Bony thorax is unremarkable.
IMPRESSION: Stable continued diffuse opacification of left lung consistent with
pneumonia.

## 2021-10-12 MED ORDER — METOPROLOL TARTRATE 25 MG PO TABS
37.5000 mg | ORAL_TABLET | Freq: Two times a day (BID) | ORAL | Status: DC
  Administered 2021-10-12 – 2021-10-14 (×4): 37.5 mg via ORAL
  Filled 2021-10-12 (×4): qty 1

## 2021-10-12 NOTE — Progress Notes (Signed)
Physical Therapy Treatment Patient Details Name: Mark Thomas MRN: [id number] DOB: 08/02/1950 Today's Date: 10/12/2021   History of Present Illness 71 y.o. male who presented to outside ED 10/04/21 with worsening shortness of breath and cough. recurrent pna (has had 2 recent hospitalizations for pna); +afib with RVR; 6/7 bronchoscopy with lavage; PMH includes: anxiety, CAD, depression, HTN, MI, stroke, vascular dementia, CKD, afib (not on anticoagulation), and orthostatic hypotension.    PT Comments    Patient has made slower than anticipated progress and now needs incr time with therapies prior to returning home with wife. Discharge recommendation updated to SNF. Patient was able to stand-pivot to chair today, however his SBP still dropped 25 points while standing with pt reporting dizziness.     Recommendations for follow up therapy are one component of a multi-disciplinary discharge planning process, led by the attending physician.  Recommendations may be updated based on patient status, additional functional criteria and insurance authorization.  Follow Up Recommendations  Skilled nursing-short term rehab (<3 hours/day)     Assistance Recommended at Discharge Intermittent Supervision/Assistance  Patient can return home with the following Assistance with cooking/housework;Direct supervision/assist for medications management;Direct supervision/assist for financial management;A lot of help with walking and/or transfers;Assist for transportation   Equipment Recommendations  None recommended by PT    Recommendations for Other Services       Precautions / Restrictions Precautions Precautions: Fall;Other (comment) Precaution Comments: watch for orthostasis, pt significantly anxious with any OOB activity (hx dementia) Restrictions Weight Bearing Restrictions: No     Mobility  Bed Mobility Overal bed mobility: Needs Assistance Bed Mobility: Supine to Sit     Supine to sit: Min  guard     General bed mobility comments: no cues needed; guarding for safety as moving quickly and almost impulsively standing upon sitting up    Transfers Overall transfer level: Needs assistance Equipment used: Rolling walker (2 wheels) Transfers: Sit to/from Stand, Bed to chair/wheelchair/BSC Sit to Stand: Min guard   Step pivot transfers: Min guard       General transfer comment: cues for hand placement, no steadying needed upon standing    Ambulation/Gait             Pre-gait activities: several steps from bed to chair (~3 ft) with RW; stood an additional 1 minute trying to get standing bP     Stairs             Wheelchair Mobility    Modified Rankin (Stroke Patients Only)       Balance Overall balance assessment: Needs assistance Sitting-balance support: No upper extremity supported, Feet supported Sitting balance-Leahy Scale: Fair     Standing balance support: Bilateral upper extremity supported, During functional activity, Reliant on assistive device for balance Standing balance-Leahy Scale: Poor Standing balance comment: needs BUE support or external assist with U UE support when checking BP                            Cognition Arousal/Alertness: Awake/alert Behavior During Therapy: Anxious, Flat affect Overall Cognitive Status: Impaired/Different from baseline Area of Impairment: Problem solving, Attention, Safety/judgement                   Current Attention Level: Selective Memory: Decreased short-term memory       Problem Solving: Slow processing, Difficulty sequencing, Requires verbal cues, Requires tactile cues General Comments: Patient agreeable to attempt OOB to chair; able to recall exercisees  from his HEP that he has been doing at home        Exercises General Exercises - Lower Extremity Long Arc Quad: AROM, Both, 15 reps Hip ABduction/ADduction: AROM, Both, 15 reps, Seated Hip Flexion/Marching: AROM,  Both, 15 reps, Seated Toe Raises: AROM, Both, 15 reps, Seated Heel Raises: AROM, Both, 15 reps, Seated    General Comments General comments (skin integrity, edema, etc.): BP still with signficant drop with OOB (157/101 to 124/103) with pt reporting dizziness. He denied any sense of movement or lightheadedness "just dizzy"      Pertinent Vitals/Pain Pain Assessment Pain Assessment: No/denies pain    Home Living Family/patient expects to be discharged to:: Private residence Living Arrangements: Spouse/significant other Available Help at Discharge: Available 24 hours/day;Family Type of Home: House Home Access: Level entry       Home Layout: One level Home Equipment: Shower seat;Shower seat - built in;Hand held Museum/gallery curator (4 wheels);Toilet riser;Wheelchair - manual Additional Comments: added grab bar horziontal the length of the bathroom - home health OT requested and pt had it installed. pt has golden Pharmacist, hospital- [facility]    Prior Function            PT Goals (current goals can now be found in the care plan section) Acute Rehab PT Goals Patient Stated Goal: breathe better and go home PT Goal Formulation: With patient/family Time For Goal Achievement: 10/21/21 Potential to Achieve Goals: Good Progress towards PT goals: Progressing toward goals (slower than anticipated)    Frequency    Min 2X/week      PT Plan Discharge plan needs to be updated;Frequency needs to be updated    Co-evaluation              AM-PAC PT "6 Clicks" Mobility   Outcome Measure  Help needed turning from your back to your side while in a flat bed without using bedrails?: A Little Help needed moving from lying on your back to sitting on the side of a flat bed without using bedrails?: A Little Help needed moving to and from a bed to a chair (including a wheelchair)?: A Little Help needed standing up from a chair using your arms (e.g., wheelchair or bedside chair)?: A  Little Help needed to walk in hospital room?: Total Help needed climbing 3-5 steps with a railing? : Total 6 Click Score: 14    End of Session Equipment Utilized During Treatment: Gait belt;Oxygen Activity Tolerance: Patient limited by fatigue Patient left: in chair;with call bell/phone within reach;with family/visitor present Nurse Communication: Mobility status PT Visit Diagnosis: History of falling (Z91.81);Difficulty in walking, not elsewhere classified (R26.2);Dizziness and giddiness (R42)     Time: 9163-8466 PT Time Calculation (min) (ACUTE ONLY): 32 min  Charges:  $Therapeutic Exercise: 8-22 mins $Therapeutic Activity: 8-22 mins                      [place]  Office [phone]    Rexanne Mano 10/12/2021, 2:33 PM

## 2021-10-12 NOTE — Progress Notes (Signed)
PROGRESS NOTE        PATIENT DETAILS Name: Mark Thomas Age: 71 y.o. Sex: male Date of Birth: 10-11-1950 Admit Date: 10/03/2021 Admitting Physician Bonnell Public, MD CVK:[id number], Einar Pheasant, DO  Brief Summary: Patient is a 71 y.o.  male history of CAD-s/p PCI PAF-s/p watchman's device, CVA who has numerous hospitalizations for recurrent PNA-presenting with SOB-found to have hypoxia due to multifocal PNA and subsequently admitted to the hospitalist service.    Significant events: 4/05-4/10>> hospitalization for PNA 4/27-4/30>> hospitalization for sepsis/hypoxia due to PNA. 6/04>> admit to [facility]-due to pneumonia. 6/09>> significant radiological worsening-with complete opacification of left lung while on cefepime.  Started on steroids by PCCM.  Significant studies: 4/28>> TTE: EF 36-06%, grade 2 diastolic dysfunction, RVSP 49.5, moderate aortic stenosis 6/04>> CXR: Worsening opacities in the left lung 6/09>> CXR: Significant interval worsening of left lung opacities-near complete opacification of left lung. 6/09>> CT chest: Extensive airspace disease throughout left lung 6/13>> CXR: Continues to have significant opacification of left lung   Significant microbiology data: 6/04>> COVID/INFLUENZA PCR: Negative 6/04>> blood culture: Negative 6/07>> BAL culture: Negative  6/07>> BAL AFB smear: Negative 6/07>> BAL AFB culture: Pending 6/07>> BAL fungal culture: Pending 6/07>> BAL cytology: No malignant cells identified  Procedures: 6/07>> bronchoscopy  Consults: PCCM  Subjective: Lying comfortably in bed-claims he feels better.  Objective: Vitals: Blood pressure (!) 171/92, pulse (!) 102, temperature 98 F (36.7 C), temperature source Oral, resp. rate 20, height _0  (1.854 m), weight 117 kg, SpO2 92 %.   Exam: Gen Exam:Alert awake-not in any distress HEENT:atraumatic, normocephalic Chest: B/L clear to auscultation anteriorly CVS:S1S2  regular Abdomen:soft non tender, non distended Extremities:no edema Neurology: Non focal Skin: no rash    Pertinent Labs/Radiology:    Latest Ref Rng & Units 10/11/2021   12:27 AM 10/09/2021   12:38 AM 10/07/2021    4:49 AM  CBC  WBC 4.0 - 10.5 K/uL 10.8  10.1  9.1   Hemoglobin 13.0 - 17.0 g/dL 9.0  8.8  8.7   Hematocrit 39.0 - 52.0 % 28.9  27.6  27.1   Platelets 150 - 400 K/uL 172  161  175     Lab Results  Component Value Date   NA 137 10/12/2021   K 3.6 10/12/2021   CL 100 10/12/2021   CO2 30 10/12/2021      Assessment/Plan: Acute hypoxic respiratory failure due to recurrent multifocal pneumonia: Overall stable-unclear why he worsened with complete opacification of left lung while on cefepime in the hospital-?  Had aspiration event.  He is relatively stable on just 2 L of oxygen-remains on steroids-currently on 9/10-day of cefepime.  Volume status is stable-has received several days of life with Lasix-with no signs of volume overload today.  Do not think he requires any further furosemide at this point.  CXR done this morning is essentially unchanged-we will await further recommendations from PCCM.  I have ordered home ambulatory O2 screen.    History of recurrent pneumonia: Occurring since COVID-19 April 2021-apparently improved after course of antibiotics/steroids-s/p bronchoscopy on 6/7-BAL cultures negative so far.  Unclear whether recurrent PNA was possible ILD flare.  History of interstitial lung disease: Suspected GERD contributing to his ILD.  Supportive care-on PPI.  Chronic HFpEF: Volume status stable-do not think he requires any further diuretics at this point.  He has a  history of orthostatic hypotension.  He was diuresed to ensure negative balance in the setting of hypoxia/PNA.  SVT: Continue beta-blocker-evaluated by cardiology-not felt to have A-fib-continue beta-blocker.  No longer on amiodarone.  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  Per cardiology note-not felt to have A-fib.  No longer on anticoagulation with watchman's device in place.  History of CAD s/p multiple PCI's: Currently without any anginal symptoms.  Continue aspirin/Plavix/statin/beta-blocker.  History of ASD-s/p closure 01/22/2020  History of moderate aortic stenosis: Stable for outpatient follow-up with cardiology.  History of CVA: No focal deficits.  Continue antiplatelets-statin.  History of sick sinus syndrome-s/p PPM placement: Continue telemetry monitoring.  History of vascular dementia: Appears to be mild-continue Namenda//Seroquel.  Maintain delirium precautions.  History of anxiety/depression: Continue Lexapro  History of orthostatic hypotension: Difficult situation-blood pressure has been fluctuating quite a bit-was persistently elevated in the 160s-170s range for the past few days-have increased dosage of metoprolol-stop Florinef-and have decreased midodrine to 2.5 mg 3 times daily with holding parameters.  Follow closely.    GERD: Continue PPI  Obesity: Estimated body mass index is 34.03 kg/m as calculated from the following:   Height as of this encounter: _0  (1.854 m).   Weight as of this encounter: 117 kg.   Code status:   Code Status: Full Code   DVT Prophylaxis: enoxaparin (LOVENOX) injection 40 mg Start: 10/04/21 2200   Family Communication: Spouse-Annette-[phone] updated over the phone on 6/13   Disposition Plan: Status is: Inpatient Remains inpatient appropriate because: Recurrent PNA-needs IV cefepime x10 days total.   Planned Discharge Destination:Home   Diet: Diet Order             Diet Heart Room service appropriate? Yes; Fluid consistency: Thin  Diet effective now                     Antimicrobial agents: Anti-infectives (From admission, onward)    Start     Dose/Rate Route Frequency Ordered Stop   10/05/21 1800   vancomycin (VANCOREADY) IVPB 1250 mg/250 mL  Status:  Discontinued        1,250 mg 166.7 mL/hr over 90 Minutes Intravenous Every 12 hours 10/05/21 0957 10/07/21 1200   10/04/21 0615  Vancomycin (VANCOCIN) 1,500 mg in sodium chloride 0.9 % 500 mL IVPB        1,500 mg 250 mL/hr over 120 Minutes Intravenous  Once 10/04/21 0600 10/04/21 0926   10/04/21 0605  vancomycin (VANCOCIN) 1000 MG powder       Note to Pharmacy: April Holding C: cabinet override      10/04/21 0605 10/04/21 1814   10/04/21 0605  vancomycin (VANCOCIN) 500 MG powder       Note to Pharmacy: April Holding C: cabinet override      10/04/21 0605 10/04/21 1814   10/04/21 0400  vancomycin (VANCOREADY) IVPB 1500 mg/300 mL  Status:  Discontinued        1,500 mg 150 mL/hr over 120 Minutes Intravenous Every 12 hours 10/03/21 1357 10/05/21 0957   10/03/21 1445  vancomycin (VANCOCIN) IVPB 1000 mg/200 mL premix       See Hyperspace for full Linked Orders Report.   1,000 mg 200 mL/hr over 60 Minutes Intravenous  Once 10/03/21 1335 10/03/21 1938   10/03/21 1400  ceFEPIme (MAXIPIME) 2 g in sodium chloride 0.9 % 100 mL IVPB  2 g 200 mL/hr over 30 Minutes Intravenous Every 8 hours 10/03/21 1356 10/13/21 1359   10/03/21 1345  vancomycin (VANCOCIN) IVPB 1000 mg/200 mL premix       See Hyperspace for full Linked Orders Report.   1,000 mg 200 mL/hr over 60 Minutes Intravenous  Once 10/03/21 1335 10/03/21 1846   10/03/21 1330  vancomycin (VANCOCIN) IVPB 1000 mg/200 mL premix  Status:  Discontinued        1,000 mg 200 mL/hr over 60 Minutes Intravenous  Once 10/03/21 1321 10/03/21 1335   10/03/21 1330  ceFEPIme (MAXIPIME) 2 g in sodium chloride 0.9 % 100 mL IVPB  Status:  Discontinued        2 g 200 mL/hr over 30 Minutes Intravenous  Once 10/03/21 1321 10/03/21 1356   10/03/21 1330  azithromycin (ZITHROMAX) 500 mg in sodium chloride 0.9 % 250 mL IVPB        500 mg 250 mL/hr over 60 Minutes Intravenous  Once 10/03/21 1321 10/03/21  1710        MEDICATIONS: Scheduled Meds:  albuterol  2.5 mg Nebulization BID   allopurinol  100 mg Oral Daily   aspirin EC  81 mg Oral q AM   atorvastatin  80 mg Oral Daily   bacitracin   Topical BID   cholecalciferol  400 Units Oral Daily   clopidogrel  75 mg Oral Daily   enoxaparin (LOVENOX) injection  40 mg Subcutaneous Q24H   escitalopram  30 mg Oral Daily   guaiFENesin  600 mg Oral BID   hydrocortisone cream   Topical TID   latanoprost  1 drop Both Eyes QHS   lidocaine  1 patch Transdermal Q24H   magnesium oxide  400 mg Oral Daily   memantine  10 mg Oral BID   metoprolol tartrate  25 mg Oral BID   midodrine  2.5 mg Oral TID WC   mometasone-formoterol  2 puff Inhalation BID   ondansetron (ZOFRAN) IV  4 mg Intravenous Once   pantoprazole  40 mg Oral QPM   polyethylene glycol  17 g Oral BID   potassium citrate  10 mEq Oral Daily   predniSONE  40 mg Oral Q breakfast   primidone  100 mg Oral BID   QUEtiapine  100 mg Oral QHS   ranolazine  1,000 mg Oral BID   rOPINIRole  4 mg Oral QHS   Continuous Infusions:  sodium chloride 10 mL/hr at 10/11/21 2255   ceFEPime (MAXIPIME) IV 2 g (10/12/21 0614)   PRN Meds:.sodium chloride, acetaminophen, benzonatate, hydrALAZINE, ipratropium-albuterol, oxyCODONE-acetaminophen, sodium chloride   I have personally reviewed following labs and imaging studies  LABORATORY DATA: CBC: Recent Labs  Lab 10/06/21 0252 10/07/21 0449 10/09/21 0038 10/11/21 0027  WBC 13.0* 9.1 10.1 10.8*  HGB 9.1* 8.7* 8.8* 9.0*  HCT 29.0* 27.1* 27.6* 28.9*  MCV 105.5* 104.2* 101.8* 102.5*  PLT 204 175 161 172     Basic Metabolic Panel: Recent Labs  Lab 10/06/21 0252 10/07/21 0449 10/08/21 0502 10/09/21 0038 10/10/21 0545 10/11/21 0027 10/12/21 0142  NA 137 140 139 138  --  136 137  K 4.0 4.5 4.1 3.9  --  3.5 3.6  CL 107 109 111 104  --  98 100  CO2 19* _0 --  28 30  GLUCOSE 232* 114* 104* 124*  --  151* 133*  BUN 26* 28* 19 17  --   26* 21  CREATININE 1.10 0.94 0.77 0.75 0.92  0.93 0.98  CALCIUM 8.7* 8.7* 8.1* 8.3*  --  8.4* 8.4*  MG 2.0 2.1  --   --   --   --   --      GFR: Estimated Creatinine Clearance: 96.6 mL/min (by C-G formula based on SCr of 0.98 mg/dL).  Liver Function Tests: No results for input(s): "AST", "ALT", "ALKPHOS", "BILITOT", "PROT", "ALBUMIN" in the last 168 hours.  No results for input(s): "LIPASE", "AMYLASE" in the last 168 hours. No results for input(s): "AMMONIA" in the last 168 hours.  Coagulation Profile: No results for input(s): "INR", "PROTIME" in the last 168 hours.   Cardiac Enzymes: No results for input(s): "CKTOTAL", "CKMB", "CKMBINDEX", "TROPONINI" in the last 168 hours.  BNP (last 3 results) Recent Labs    03/18/21 1004  PROBNP 370     Lipid Profile: No results for input(s): "CHOL", "HDL", "LDLCALC", "TRIG", "CHOLHDL", "LDLDIRECT" in the last 72 hours.  Thyroid Function Tests: No results for input(s): "TSH", "T4TOTAL", "FREET4", "T3FREE", "THYROIDAB" in the last 72 hours.  Anemia Panel: No results for input(s): "VITAMINB12", "FOLATE", "FERRITIN", "TIBC", "IRON", "RETICCTPCT" in the last 72 hours.  Urine analysis:    Component Value Date/Time   COLORURINE AMBER (A) 10/07/2021 2003   APPEARANCEUR CLEAR 10/07/2021 2003   LABSPEC 1.021 10/07/2021 2003   PHURINE 5.0 10/07/2021 2003   GLUCOSEU NEGATIVE 10/07/2021 2003   HGBUR NEGATIVE 10/07/2021 2003   [place] NEGATIVE 10/07/2021 2003   [place] NEGATIVE 10/07/2021 2003   PROTEINUR 30 (A) 10/07/2021 2003   NITRITE NEGATIVE 10/07/2021 2003   LEUKOCYTESUR NEGATIVE 10/07/2021 2003    Sepsis Labs: Lactic Acid, Venous    Component Value Date/Time   LATICACIDVEN 1.3 10/03/2021 1505    MICROBIOLOGY: Recent Results (from the past 240 hour(s))  Culture, blood (Routine x 2)     Status: None   Collection Time: 10/03/21  3:00 PM   Specimen: BLOOD LEFT ARM  Result Value Ref Range Status   Specimen  Description   Final    BLOOD LEFT ARM BLOOD Performed at [facility], [address] [place], [place] [address]    Special Requests   Final    Blood Culture adequate volume BOTTLES DRAWN AEROBIC AND ANAEROBIC Performed at [facility], [address] [place], [place] [address]    Culture   Final    NO GROWTH 5 DAYS Performed at [facility], [place] [address], [place], [place] [address]    Report Status 10/08/2021 FINAL  Final  Resp Panel by RT-PCR (Flu A&B, Covid) Anterior Nasal Swab     Status: None   Collection Time: 10/03/21  3:05 PM   Specimen: Anterior Nasal Swab  Result Value Ref Range Status   SARS Coronavirus 2 by RT PCR NEGATIVE NEGATIVE Final    Comment: (NOTE) SARS-CoV-2 target nucleic acids are NOT DETECTED.  The SARS-CoV-2 RNA is generally detectable in upper respiratory specimens during the acute phase of infection. The lowest concentration of SARS-CoV-2 viral copies this assay can detect is 138 copies/mL. A negative result does not preclude SARS-Cov-2 infection and should not be used as the sole basis for treatment or other patient management decisions. A negative result may occur with  improper specimen collection/handling, submission of specimen other than nasopharyngeal swab, presence of viral mutation(s) within the areas targeted by this assay, and inadequate number of viral copies(<138 copies/mL). A negative result must be combined with clinical observations, patient history, and epidemiological information. The expected  result is Negative.  Fact Sheet for Patients:  EntrepreneurPulse.com.au  Fact Sheet for Healthcare Providers:  IncredibleEmployment.be  This test is no t yet approved or cleared by the [place] FDA and  has been authorized for detection and/or diagnosis of SARS-CoV-2 by FDA under an Emergency Use Authorization (EUA). This EUA will remain  in effect (meaning this test  can be used) for the duration of the COVID-19 declaration under Section 564(b)(1) of the Act, 21 U.S.C.section 360bbb-3(b)(1), unless the authorization is terminated  or revoked sooner.       Influenza A by PCR NEGATIVE NEGATIVE Final   Influenza B by PCR NEGATIVE NEGATIVE Final    Comment: (NOTE) The Xpert Xpress SARS-CoV-2/FLU/RSV plus assay is intended as an aid in the diagnosis of influenza from Nasopharyngeal swab specimens and should not be used as a sole basis for treatment. Nasal washings and aspirates are unacceptable for Xpert Xpress SARS-CoV-2/FLU/RSV testing.  Fact Sheet for Patients: EntrepreneurPulse.com.au  Fact Sheet for Healthcare Providers: IncredibleEmployment.be  This test is not yet approved or cleared by the [place] FDA and has been authorized for detection and/or diagnosis of SARS-CoV-2 by FDA under an Emergency Use Authorization (EUA). This EUA will remain in effect (meaning this test can be used) for the duration of the COVID-19 declaration under Section 564(b)(1) of the Act, 21 U.S.C. section 360bbb-3(b)(1), unless the authorization is terminated or revoked.  Performed at [facility], [address] [place], [place] [address]   Culture, blood (Routine x 2)     Status: None   Collection Time: 10/04/21  2:37 PM   Specimen: BLOOD LEFT HAND  Result Value Ref Range Status   Specimen Description BLOOD LEFT HAND  Final   Special Requests   Final    BOTTLES DRAWN AEROBIC AND ANAEROBIC Blood Culture adequate volume   Culture   Final    NO GROWTH 5 DAYS Performed at [facility], [place] [address], [place], [place] [address]    Report Status 10/09/2021 FINAL  Final  Fungus Culture With Stain     Status: None (Preliminary result)   Collection Time: 10/06/21 12:42 PM   Specimen: PATH Cytology Bronchial lavage; Body Fluid  Result Value Ref Range Status   Fungus Stain Final report  Final    Comment:  (NOTE) Performed At: [facility] [place], [place] [id number] Rush Farmer MD IP:[id number]    Fungus (Mycology) Culture PENDING  Incomplete   Fungal Source BRONCHIAL ALVEOLAR LAVAGE  Final    Comment: LINGULA Performed at [facility], [place] [address] [place], [place] [address]   Acid Fast Smear (AFB)     Status: None   Collection Time: 10/06/21 12:42 PM   Specimen: PATH Cytology Bronchial lavage; Body Fluid  Result Value Ref Range Status   AFB Specimen Processing Concentration  Final   Acid Fast Smear Negative  Final    Comment: (NOTE) Performed At: [facility] [place], [place] [id number] Rush Farmer MD DZ:[id number]    Source (AFB) BRONCHIAL ALVEOLAR LAVAGE  Final    Comment: LINGULA Performed at [facility], [place] [address], [place], [place] [address]   Culture, BAL-quantitative w Gram Stain     Status: None   Collection Time: 10/06/21 12:42 PM   Specimen: Bronchoalveolar Lavage  Result Value Ref Range Status   Specimen Description BRONCHIAL ALVEOLAR LAVAGE  Final   Special Requests LINGULA  Final   Gram  Stain   Final    RARE WBC PRESENT,BOTH PMN AND MONONUCLEAR NO ORGANISMS SEEN    Culture   Final    NO GROWTH 2 DAYS Performed at [facility], [address] [place], [place] [address]    Report Status 10/08/2021 FINAL  Final  Fungus Culture Result     Status: None   Collection Time: 10/06/21 12:42 PM  Result Value Ref Range Status   Result 1 Comment  Final    Comment: (NOTE) KOH/Calcofluor preparation:  no fungus observed. Performed At: [facility] [place], [place] [id number] Rush Farmer MD [id number]     RADIOLOGY STUDIES/RESULTS: DG CHEST PORT 1 VIEW  Result Date: 10/12/2021 CLINICAL DATA:  Pneumonia. EXAM: PORTABLE CHEST 1 VIEW COMPARISON:  October 08, 2021. FINDINGS: Stable cardiomediastinal silhouette. Left-sided pacemaker is unchanged in position.  Continued diffuse opacification of left lung is noted consistent with pneumonia. Mild right basilar atelectasis or infiltrate is noted. Bony thorax is unremarkable. IMPRESSION: Stable continued diffuse opacification of left lung consistent with pneumonia. Electronically Signed   By: Marijo Conception M.D.   On: 10/12/2021 08:12     LOS: 8 days   Oren Binet, MD  [facility]    To contact the attending provider between 7A-7P or the covering provider during after hours 7P-7A, please log into the web site [email or web address] and access using universal [place] password for that web site. If you do not have the password, please call the hospital operator.  10/12/2021, 11:20 AM

## 2021-10-12 NOTE — NC FL2 (Signed)
[place] LEVEL OF CARE SCREENING TOOL     IDENTIFICATION  Patient Name: Mark Thomas Birthdate: [birth year] Sex: male Admission Date (Current Location): 10/03/2021  [facility] and [place] Number:  Herbalist and Address:         Provider Number: [id number]  Attending Physician Name and Address:  Jonetta Osgood, MD  Relative Name and Phone Number:       Current Level of Care: Hospital Recommended Level of Care: [place] Prior Approval Number:    Date Approved/Denied:   PASRR Number: [id number]  Discharge Plan: SNF    Current Diagnoses: Patient Active Problem List   Diagnosis Date Noted   Supraventricular tachycardia ([place]) 10/05/2021   History of CVA (cerebrovascular accident) 10/04/2021   Hemoptysis 10/04/2021   Shingles 09/14/2021   CAD (coronary artery disease) 08/26/2021   Sick sinus syndrome s/p PPM  08/26/2021   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, 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  08/26/2021   Acute respiratory failure with hypoxia ([place]) 08/26/2021   Dyspnea 08/26/2021   Lobar pneumonia, unspecified organism ([place]) 08/05/2021   Hypotension 08/04/2021   Kidney cysts 08/04/2021   Coughing 07/28/2021   Seborrheic keratosis 07/28/2021   Peripheral venous insufficiency 07/07/2021   Psoriasis 07/07/2021   Sensorineural hearing loss, unilateral, left ear, with unrestricted hearing on the contralateral side 07/07/2021   Community acquired pneumonia 07/07/2021   Other specified problems related to psychosocial circumstances 03/30/2021   Tinea 03/30/2021   Xerosis cutis 03/30/2021   Stroke ([place]) 02/02/2021   Myocardial infarct ([place]) 02/02/2021   Weakness 01/09/2021   Aortic stenosis 01/08/2021   Vascular dementia with increased confusion/anger 01/08/2021   TIA (transient ischemic attack) 01/07/2021   Rotator cuff arthropathy of right shoulder 12/30/2020   Coronary arteriosclerosis 12/24/2020   Chronic alcoholism in remission ([place])  12/24/2020   Disorder of bursae and tendons in shoulder region 12/24/2020   Gastric ulcer with hemorrhage 12/24/2020   Gout 12/24/2020   Major depressive disorder, single episode, severe, 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[place]) 12/24/2020   Mitral valve prolapse 12/24/2020   Other and unspecified mycoses 12/24/2020   Chronic gingivitis, non-plaque induced 12/24/2020   Posttraumatic stress disorder 12/24/2020   Paroxysmal atrial fibrillation (HCC) s/p Watchman device 12/24/2020   Essential tremor 12/24/2020   GAD (generalized anxiety disorder) 12/24/2020   Anemia 09/30/2020   Restless legs syndrome (RLS) 09/30/2020   Elevated liver enzymes 05/26/2020   Orthostatic hypotension 10/01/2018   Depression 06/01/2018   Gastroesophageal reflux disease without esophagitis 12/13/2017   Insomnia 09/26/2017   CKD (chronic kidney disease) stage 3, GFR 30-59 ml/min (HCC) 09/14/2017   Pacemaker 02/17/2017   History of cardiac radiofrequency ablation (RFA) 12/18/2016   Sinus bradycardia 12/18/2016   Erectile dysfunction 02/12/2013   Other and unspecified hyperlipidemia 02/12/2013   Presence of stent in LAD coronary artery 02/12/2013    Orientation RESPIRATION BLADDER Height & Weight     Self, Time, Situation, Place  O2 (nasal cannula 2L) Incontinent Weight: 257 lb 15 oz (117 kg) Height:  _0  (185.4 cm)  BEHAVIORAL SYMPTOMS/MOOD NEUROLOGICAL BOWEL NUTRITION STATUS      Continent Diet (see discharge summary)  AMBULATORY STATUS COMMUNICATION OF NEEDS Skin   Extensive Assist Verbally Normal                       Personal Care Assistance Level of Assistance  Bathing, Dressing Bathing Assistance: Maximum assistance Feeding assistance: Maximum assistance Dressing Assistance: Maximum assistance  Functional Limitations Info  Sight Sight Info: Impaired        SPECIAL CARE FACTORS FREQUENCY  PT (By licensed PT), OT (By licensed OT)     PT Frequency: 5x/weekly OT Frequency: 5x/weekly             Contractures Contractures Info: Not present    Additional Factors Info  Allergies, Code Status Code Status Info: Full Code Status Allergies Info: Dilaudid;Bactrim; Phenobarbital; Benadryl; Penicillin           Current Medications (10/12/2021):  This is the current hospital active medication list Current Facility-Administered Medications  Medication Dose Route Frequency Provider Last Rate Last Admin   0.9 %  sodium chloride infusion   Intravenous PRN Varney Biles, MD 10 mL/hr at 10/11/21 2255 Infusion Verify at 10/11/21 2255   acetaminophen (TYLENOL) tablet 650 mg  650 mg Oral Q6H PRN Tu, Ching T, DO   650 mg at 10/12/21 1129   albuterol (PROVENTIL) (2.5 MG/3ML) 0.083% nebulizer solution 2.5 mg  2.5 mg Nebulization BID Jonetta Osgood, MD   2.5 mg at 10/12/21 0744   allopurinol (ZYLOPRIM) tablet 100 mg  100 mg Oral Daily Dana Allan I, MD   100 mg at 10/12/21 0847   aspirin EC tablet 81 mg  81 mg Oral q AM Dana Allan I, MD   81 mg at 10/12/21 0604   atorvastatin (LIPITOR) tablet 80 mg  80 mg Oral Daily Dana Allan I, MD   80 mg at 10/12/21 0840   bacitracin ointment   Topical BID Elgergawy, Silver Huguenin, MD   Given at 10/12/21 0847   benzonatate (TESSALON) capsule 100 mg  100 mg Oral TID PRN Jonetta Osgood, MD   100 mg at 10/12/21 1349   ceFEPIme (MAXIPIME) 2 g in sodium chloride 0.9 % 100 mL IVPB  2 g Intravenous Q8H Wendee Beavers, RPH 200 mL/hr at 10/12/21 1331 2 g at 10/12/21 1331   cholecalciferol (VITAMIN D3) tablet 400 Units  400 Units Oral Daily Ileene Musa T, DO   400 Units at 10/12/21 0858   clopidogrel (PLAVIX) tablet 75 mg  75 mg Oral Daily Tu, Ching T, DO   75 mg at 10/12/21 0841   enoxaparin (LOVENOX) injection 40 mg  40 mg Subcutaneous Q24H Dana Allan I, MD   40 mg at 10/11/21 2212   escitalopram (LEXAPRO) tablet 30 mg  30 mg Oral Daily Tu, Ching T, DO   30 mg at 10/12/21 0835   guaiFENesin (MUCINEX) 12 hr tablet 600 mg  600 mg Oral  BID Jonetta Osgood, MD   600 mg at 10/12/21 7893   hydrALAZINE (APRESOLINE) injection 10 mg  10 mg Intravenous Q6H PRN Jonetta Osgood, MD       hydrocortisone cream 1 %   Topical TID Elgergawy, Silver Huguenin, MD   Given at 10/12/21 0848   ipratropium-albuterol (DUONEB) 0.5-2.5 (3) MG/3ML nebulizer solution 3 mL  3 mL Nebulization Q6H PRN Tu, Ching T, DO   3 mL at 10/11/21 1520   latanoprost (XALATAN) 0.005 % ophthalmic solution 1 drop  1 drop Both Eyes QHS Tu, Ching T, DO   1 drop at 10/11/21 2212   lidocaine (LIDODERM) 5 % 1 patch  1 patch Transdermal Q24H Kommor, Madison, MD   1 patch at 10/07/21 0947   magnesium oxide (MAG-OX) tablet 400 mg  400 mg Oral Daily Tu, Ching T, DO   400 mg at 10/12/21 0841   memantine (NAMENDA) tablet  10 mg  10 mg Oral BID Tu, Ching T, DO   10 mg at 10/12/21 0849   metoprolol tartrate (LOPRESSOR) tablet 37.5 mg  37.5 mg Oral BID Jonetta Osgood, MD       midodrine (PROAMATINE) tablet 2.5 mg  2.5 mg Oral TID WC Ghimire, Henreitta Leber, MD   2.5 mg at 10/11/21 1210   mometasone-formoterol (DULERA) 200-5 MCG/ACT inhaler 2 puff  2 puff Inhalation BID Tu, Ching T, DO   2 puff at 10/12/21 0744   ondansetron (ZOFRAN) injection 4 mg  4 mg Intravenous Once Kommor, Madison, MD       oxyCODONE-acetaminophen (PERCOCET/ROXICET) 5-325 MG per tablet 1 tablet  1 tablet Oral Q6H PRN Jonetta Osgood, MD   1 tablet at 10/12/21 1515   pantoprazole (PROTONIX) EC tablet 40 mg  40 mg Oral QPM Tu, Ching T, DO   40 mg at 10/11/21 1708   polyethylene glycol (MIRALAX / GLYCOLAX) packet 17 g  17 g Oral BID Jonetta Osgood, MD   17 g at 10/12/21 0846   potassium citrate (UROCIT-K) SR tablet 10 mEq  10 mEq Oral Daily Tu, Ching T, DO   10 mEq at 10/12/21 0859   predniSONE (DELTASONE) tablet 40 mg  40 mg Oral Q breakfast Olalere, Adewale A, MD   40 mg at 10/12/21 0839   primidone (MYSOLINE) tablet 100 mg  100 mg Oral BID Tu, Ching T, DO   100 mg at 10/12/21 0900   QUEtiapine (SEROQUEL) tablet  100 mg  100 mg Oral QHS Campbell Stall P, DO   496 mg at 10/11/21 2212   ranolazine (RANEXA) 12 hr tablet 1,000 mg  1,000 mg Oral BID Tu, Ching T, DO   1,000 mg at 10/12/21 0901   rOPINIRole (REQUIP XL) 24 hr tablet 4 mg  4 mg Oral QHS Tu, Ching T, DO   4 mg at 10/11/21 2216   sodium chloride (OCEAN) 0.65 % nasal spray 1 spray  1 spray Each Nare PRN Ghimire, Henreitta Leber, MD         Discharge Medications: Please see discharge summary for a list of discharge medications.  Relevant Imaging Results:  Relevant Lab Results:   Additional Information SSN: [id number]  Barton Fanny, Student-Social Work

## 2021-10-12 NOTE — Plan of Care (Signed)
  Problem: Education: Goal: Knowledge of disease or condition will improve Outcome: Progressing Goal: Knowledge of the prescribed therapeutic regimen will improve Outcome: Progressing   Problem: Activity: Goal: Ability to tolerate increased activity will improve Outcome: Progressing Goal: Will verbalize the importance of balancing activity with adequate rest periods Outcome: Progressing   Problem: Respiratory: Goal: Ability to maintain a clear airway will improve Outcome: Progressing Goal: Levels of oxygenation will improve Outcome: Progressing Goal: Ability to maintain adequate ventilation will improve Outcome: Progressing   Problem: Education: Goal: Knowledge of General Education information will improve Description: Including pain rating scale, medication(s)/side effects and non-pharmacologic comfort measures Outcome: Progressing   Problem: Activity: Goal: Risk for activity intolerance will decrease Outcome: Progressing   Problem: Nutrition: Goal: Adequate nutrition will be maintained Outcome: Progressing   Problem: Pain Managment: Goal: General experience of comfort will improve Outcome: Progressing   Problem: Safety: Goal: Ability to remain free from injury will improve Outcome: Progressing   Problem: Skin Integrity: Goal: Risk for impaired skin integrity will decrease Outcome: Progressing

## 2021-10-12 NOTE — Progress Notes (Addendum)
PT Cancellation Note  Patient Details Name: Mark Thomas MRN: [id number] DOB: 17-Sep-1950   Cancelled Treatment:    Reason Eval/Treat Not Completed: Pain limiting ability to participate  Patient reporting chest pain and cannot participate in PT at this time. RN in to provide pain medication.   10:40 Addendum--returned after pain medication and pt refusing because he wants to sleep. Educated on risks of inactivity and benefits of activity and pt adamantly refusing to participate. Then asks PT to return this afternoon. Will do as schedule permits.   [name]  Office [phone]   Rexanne Mano 10/12/2021, 9:03 AM

## 2021-10-12 NOTE — Progress Notes (Signed)
NAME:  Mark Thomas, MRN:  [id number], DOB:  October 28, 1950, LOS: 27 ADMISSION DATE:  10/03/2021, CONSULTATION DATE: 10/05/2021 REFERRING MD: [facility], CHIEF COMPLAINT: Refractory pneumonia  History of Present Illness:  71 year old male who is a never smoker with history of hypertension coronary disease with a stent along with CVA, gastroesophageal reflux disease, chronic kidney disease with current creatinine .90.  He has a history of atrial fibrillation followed by cardiology along with a permanent pacemaker and currently being paced.  He reports recently being treated for pneumonia with antibiotics with some improvement but signs and symptoms of infection have returned he noticed increasing orthopnea and generalized failure to thrive with episode of hemoptysis without fevers chills or sweats.  He did had COVID in 12/22 and has had problems with respiratory difficulty since.  He has been followed in the office by Dr. Erin Fulling and pulmonary critical care has been asked to evaluate while in the hospital due to his ongoing symptoms and been refractory to outpatient treatment.  Pertinent  Medical History   Past Medical History:  Diagnosis Date   Anxiety    Coronary artery disease    Depression    Essential tremor    GERD (gastroesophageal reflux disease)    High cholesterol    Hypertension    Myocardial infarct (HCC)    Orthostatic hypotension    Stroke [facility])    Vascular dementia ([place])      Significant Hospital Events: Including procedures, antibiotic start and stop dates in addition to other pertinent events   6/6-pulmonary consult 6/7-bronchoscopy    Interim History / Subjective:   Complaints of hemoptysis , less than 5 cc, stains on the bed Moderate distress, oxygen off saturation 86%, on 2 L 91% on 3 L 94%  Objective   Blood pressure (!) 159/102, pulse (!) 103, temperature 98 F (36.7 C), temperature source Oral, resp. rate 20, height _0  (1.854 m), weight 117 kg, SpO2 96 %.     FiO2 (%):  [28 %-35 %] 28 %   Intake/Output Summary (Last 24 hours) at 10/12/2021 1355 Last data filed at 10/12/2021 1138 Gross per 24 hour  Intake 879 ml  Output 2000 ml  Net -1121 ml    Filed Weights   10/03/21 1229 10/06/21 0616 10/06/21 1154  Weight: 117.9 kg 117 kg 117 kg    Examination: General: Adult male, resting in bed, in NAD. Neuro: A&O x 3, no deficits. HEENT: [place]/AT. Sclerae anicteric. EOMI. Cardiovascular: RRR, 3/6 SEM.  Lungs: Mild accessory muscle use, crackles at left, no rhonchi Abdomen: Obese. BS x 4, soft, NT/ND.  Musculoskeletal: No gross deformities, no edema.  Skin: Intact, warm, no rashes.   CT chest on 6/9  extensive airspace disease bilaterally left greater than right Chest x-ray 6/13 extensive airspace disease on the left, no effusions, clear on right  Labs show mild hypokalemia  Assessment & Plan:   Acute respiratory failure with hypoxia Recurrent pneumonia  Baseline mild interstitial lung disease CT reviewed with extensive consolidative changes mostly on the left consistent with multifocal pneumonia BAL cultures to date have been negative Needs ambulatory desaturation study prior to discharge to assess for home O2 needs Continue cefepime x 10 days  Steroid taper on discharge, maintained at 40 mg for now   GERD Continue on PPI He has had speech evaluation and barium swallow during prior admissions with no significant aspiration noted  Hemoptysis Plavix being continued due to history of prior PCI but may have to stop if  hemoptysis continues  Rest per primary team.    Kara Mead MD. [facility]. [place] Pulmonary & Critical care Pager : [phone]  If no response to pager , please call [phone] until 7 pm After 7:00 pm call Elink  [phone]     10/12/2021, 1:55 PM

## 2021-10-12 NOTE — TOC Initial Note (Signed)
Transition of Care [facility]) - Initial/Assessment Note    Patient Details  Name: Mark Thomas MRN: [id number] Date of Birth: July 16, 1950  [facility] of [facility]) CM/SW Contact:    Benard Halsted, LCSW Phone Number: 10/12/2021, 3:51 PM  Clinical Narrative:                 CSW received consult for possible SNF placement at time of discharge. CSW spoke with patient and spouse at bedside. Patient reported that patient's spouse is currently unable to care for patient at their home given patient's current physical needs and fall risk. Patient expressed understanding of PT recommendation and is agreeable to SNF placement at time of discharge. CSW discussed insurance authorization process and provided Medicare SNF ratings list. Patient has received COVID vaccines. CSW will send out referrals for review and spouse stated she will contact family to inquire about facilities.   Skilled Nursing Rehab Facilities-   [email or web address]   Ratings out of 5 possible   Name Address  Phone # [place] Inspection Overall  [facility] [address], [place] _0 [facility]  [address] [place], [place] [phone] _1 [facility] [place], [place] _2 [place] [address] [place] _3 [facility] [address], [place] _4 [place] [place] _5 [facility] [address] [place] _6 [facility] [address] [place] _7 Madelynn Done ([place]) [address], [place] _8 [facility] [address], [place] [phone] _9 [facility] [address], [place] [phone] _10 [facility] ([place]) [place], [place] [phone]  _11 Dustin Flock [address] [place] [phone] _12 [place] _13 [facility] [address], [place] [address] [place] 119, [place] [phone] _15 [facility] [address], [employer] [phone] _16 [address] (no [facility]) [place] [address], [place] [phone] _17 [facility] (No Humana) [address], [place] _18 [facility] (No UHC) [place], [place] [phone] _19 [facility] [address] [employer] [phone] _20 [facility] [address], [place] _21 [facility] [address] [place] [phone] _22 Chanda Busing [place], [place] <[id number]_5<\/[id number]>_42 [facility] [address] [place] _24 [facility] [place], [place] _25 [facility] [address], [place] _26 [place] [address] [place]  [phone] _27 [facility] [place] [address], [place] [phone]  _0 [facility] [place] [address], [place] _1 [place] (No Humana) [address], [place] _2 [facility] [address] [place] [phone] _3 [facility] [place], [place] _4 [facility])  [address], [place] _5 [facility]) [place], [place] _6 [facility] [address], [place] _7 [address] [place], [place] _8 [place] [facility]) [address] [place] [phone] _9 Expected Discharge Plan: [place] Barriers to Discharge: Continued Medical Work up, SNF Pending bed offer   Patient Goals and CMS Choice Patient states their goals for this hospitalization and ongoing recovery are:: Rehab CMS Medicare.gov Compare Post Acute Care list provided to:: Patient Choice offered to / list presented to : Patient, Spouse  Expected Discharge Plan and Services Expected Discharge Plan: [place] In-house Referral: Clinical Social Work   Post Acute Care Choice: [place] Living arrangements for the past 2 months: [place]                                      Prior Living Arrangements/Services Living arrangements for the past 2 months: Single Family Home Lives with:: Spouse Patient language and need for interpreter reviewed:: Yes Do you feel safe going back to the place where you live?: Yes      Need for Family Participation in Patient Care: Yes (Comment) Care giver support system in place?: Yes (comment) Current home services: Home PT Criminal Activity/Legal Involvement Pertinent to Current Situation/Hospitalization: No - Comment as needed  Activities of Daily Living Home Assistive Devices/Equipment: None ADL Screening (condition at time of admission) Patient's cognitive ability adequate to safely complete daily activities?: Yes Is the patient deaf or have difficulty hearing?: No Does the patient have difficulty seeing, even when wearing glasses/contacts?: No Does the patient have difficulty concentrating, remembering, or making decisions?: No Patient able to express need for assistance with ADLs?: Yes Does the patient have difficulty dressing or bathing?: No Independently performs ADLs?: Yes (appropriate for developmental age) Does the patient have difficulty walking or climbing stairs?: Yes Weakness of Legs: Both Weakness of Arms/Hands:  None  Permission Sought/Granted Permission sought to share information with : Facility Sport and exercise psychologist, Family Supports Permission granted to share information with : Yes, Verbal Permission Granted  Share Information with NAME: Mylinda Latina  Permission granted to share info w AGENCY: SNFs  Permission granted to share info w Relationship: Spouse  Permission granted to share info w Contact Information: [phone]  Emotional Assessment Appearance:: Appears stated age Attitude/Demeanor/Rapport: Engaged Affect (typically observed): Accepting, Appropriate Orientation: : Oriented to Self, Oriented to Place, Oriented to  Time, Oriented to Situation Alcohol / Substance Use: Not Applicable Psych Involvement: No (comment)  Admission diagnosis:  Pneumonia [J18.9] Acute respiratory failure with hypoxia ([place]) [J96.01] Community acquired pneumonia of right lung, unspecified part of lung [J18.9] Patient Active Problem List   Diagnosis Date Noted   Supraventricular tachycardia ([place]) 10/05/2021   History of CVA (cerebrovascular accident) 10/04/2021  Hemoptysis 10/04/2021   Shingles 09/14/2021   CAD (coronary artery disease) 08/26/2021   Sick sinus syndrome s/p PPM  08/26/2021   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, 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  08/26/2021   Acute respiratory failure with hypoxia (HCC) 08/26/2021   Dyspnea 08/26/2021   Lobar pneumonia, unspecified organism ([place]) 08/05/2021   Hypotension 08/04/2021   Kidney cysts 08/04/2021   Coughing 07/28/2021   Seborrheic keratosis 07/28/2021   Peripheral venous insufficiency 07/07/2021   Psoriasis 07/07/2021   Sensorineural hearing loss, unilateral, left ear, with unrestricted hearing on the contralateral side 07/07/2021   Community acquired pneumonia 07/07/2021   Other specified problems related to psychosocial circumstances 03/30/2021   Tinea 03/30/2021   Xerosis cutis 03/30/2021   Stroke ([place]) 02/02/2021   Myocardial infarct ([place]) 02/02/2021    Weakness 01/09/2021   Aortic stenosis 01/08/2021   Vascular dementia with increased confusion/anger 01/08/2021   TIA (transient ischemic attack) 01/07/2021   Rotator cuff arthropathy of right shoulder 12/30/2020   Coronary arteriosclerosis 12/24/2020   Chronic alcoholism in remission ([place]) 12/24/2020   Disorder of bursae and tendons in shoulder region 12/24/2020   Gastric ulcer with hemorrhage 12/24/2020   Gout 12/24/2020   Major depressive disorder, single episode, severe, 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[place]) 12/24/2020   Mitral valve prolapse 12/24/2020   Other and unspecified mycoses 12/24/2020   Chronic gingivitis, non-plaque induced 12/24/2020   Posttraumatic stress disorder 12/24/2020   Paroxysmal atrial fibrillation (HCC) s/p Watchman device 12/24/2020   Essential tremor 12/24/2020   GAD (generalized anxiety disorder) 12/24/2020   Anemia 09/30/2020   Restless legs syndrome (RLS) 09/30/2020   Elevated liver enzymes 05/26/2020   Orthostatic hypotension 10/01/2018   Depression 06/01/2018   Gastroesophageal reflux disease without esophagitis 12/13/2017   Insomnia 09/26/2017   CKD (chronic kidney disease) stage 3, GFR 30-59 ml/min ([place]) 09/14/2017   Pacemaker 02/17/2017   History of cardiac radiofrequency ablation (RFA) 12/18/2016   Sinus bradycardia 12/18/2016   Erectile dysfunction 02/12/2013   Other and unspecified hyperlipidemia 02/12/2013   Presence of stent in LAD coronary artery 02/12/2013   PCP:  Luetta Nutting, DO Pharmacy:   [id number] [place] [address] [place] [address] [place] [address] Phone: [phone] Fax: [phone]  [facility] [place], [place] [address] [place] [address] Phone: [phone] Fax: [phone]     Social Determinants of Health (SDOH) Interventions    Readmission Risk Interventions    10/12/2021     3:48 PM  Readmission Risk Prevention Plan  Transportation Screening Complete  Medication Review (RN Care Manager) Complete  PCP or Specialist appointment within 3-5 days of discharge Complete  HRI or Home Care Consult Complete  SW Recovery Care/Counseling Consult Complete  Palliative Care Screening Not Applicable  Skilled Nursing Facility Complete

## 2021-10-13 DIAGNOSIS — J181 Lobar pneumonia, unspecified organism: Secondary | ICD-10-CM | POA: Diagnosis not present

## 2021-10-13 DIAGNOSIS — J9601 Acute respiratory failure with hypoxia: Secondary | ICD-10-CM | POA: Diagnosis not present

## 2021-10-13 DIAGNOSIS — Z8673 Personal history of transient ischemic attack (TIA), and cerebral infarction without residual deficits: Secondary | ICD-10-CM | POA: Diagnosis not present

## 2021-10-13 DIAGNOSIS — J189 Pneumonia, unspecified organism: Secondary | ICD-10-CM | POA: Diagnosis not present

## 2021-10-13 DIAGNOSIS — I2510 Atherosclerotic heart disease of native coronary artery without angina pectoris: Secondary | ICD-10-CM | POA: Diagnosis not present

## 2021-10-13 DIAGNOSIS — R042 Hemoptysis: Secondary | ICD-10-CM | POA: Diagnosis not present

## 2021-10-13 LAB — FUNGITELL, SERUM: Fungitell Result: 37 pg/mL (ref <80)

## 2021-10-13 MED ORDER — LEVOFLOXACIN 750 MG PO TABS
750.0000 mg | ORAL_TABLET | Freq: Every day | ORAL | Status: DC
Start: 2021-10-13 — End: 2021-10-14
  Administered 2021-10-13 – 2021-10-14 (×2): 750 mg via ORAL
  Filled 2021-10-13 (×2): qty 1

## 2021-10-13 NOTE — Progress Notes (Signed)
NAME:  Mark Thomas, MRN:  [id number], DOB:  02/15/1951, LOS: 9 ADMISSION DATE:  10/03/2021, CONSULTATION DATE: 10/05/2021 REFERRING MD: [facility], CHIEF COMPLAINT: Refractory pneumonia  History of Present Illness:  71 year old male who is a never smoker with history of hypertension coronary disease with a stent along with CVA, gastroesophageal reflux disease, chronic kidney disease with current creatinine .90.  He has a history of atrial fibrillation followed by cardiology along with a permanent pacemaker and currently being paced.  He reports recently being treated for pneumonia with antibiotics with some improvement but signs and symptoms of infection have returned he noticed increasing orthopnea and generalized failure to thrive with episode of hemoptysis without fevers chills or sweats.  He did had COVID in 12/22 and has had problems with respiratory difficulty since.  He has been followed in the office by Dr. Erin Fulling and pulmonary critical care has been asked to evaluate while in the hospital due to his ongoing symptoms and been refractory to outpatient treatment.  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[facility] , has osteotomy flaps   Past Medical History:  Diagnosis Date   Anxiety    Coronary artery disease    Depression    Essential tremor    GERD (gastroesophageal reflux disease)    High cholesterol    Hypertension    Myocardial infarct (HCC)    Orthostatic hypotension    Stroke [name])    Vascular dementia ([place])      Significant Hospital Events: Including procedures, antibiotic start and stop dates in addition to other pertinent events   6/6-pulmonary consult 6/7-bronchoscopy 6/12 epistaxis 6/13 hemoptysis    Interim History / Subjective:   No further episodes of hemoptysis Afebrile Saturation 98% on 3 L  Objective   Blood pressure 139/74, pulse 92, temperature 97.6 F (36.4 C), temperature source Oral, resp. rate 18, height _0  (1.854 m),  weight 117 kg, SpO2 95 %.    FiO2 (%):  [32 %] 32 %   Intake/Output Summary (Last 24 hours) at 10/13/2021 1344 Last data filed at 10/13/2021 0700 Gross per 24 hour  Intake 320.07 ml  Output 2175 ml  Net -1854.93 ml    Filed Weights   10/03/21 1229 10/06/21 0616 10/06/21 1154  Weight: 117.9 kg 117 kg 117 kg    Examination: General: Adult male, resting in bed, in NAD. Neuro: A&O x 3, no deficits. HEENT: [place]/AT. Sclerae anicteric. EOMI. Cardiovascular: S1-S2 regular, 3 of/6 ESMat base Lungs: No accessory muscle use, decreased breath sounds on left Abdomen: Obese. BS x 4, soft, NT/ND.  Musculoskeletal: No gross deformities, no edema.  Skin: Intact, warm, no rashes.   CT chest on 6/9  extensive airspace disease bilaterally left greater than right Chest x-ray 6/13 extensive airspace disease on the left, no effusions, clear on right  Labs show mild hypokalemia, stable WBC count  Assessment & Plan:   Acute respiratory failure with hypoxia Recurrent pneumonia  -review of imaging shows appearance of left lower lobe infiltrate on 5/30, worsens into lobar consolidation on 6/4, bronchoscopy on 6/7 and then develops left white out on follow-up imaging 6/9. Aspiration possible, wife reports intermittent episodes of aspiration, previous swallow evaluation has been negative Baseline mild interstitial lung disease CT reviewed with extensive consolidative changes mostly on the left consistent with multifocal pneumonia BAL cultures to date have been negative, including fungal stain, await culture Completed cefepime for 10 days, can transition to Levaquin for another 5 days. Will likely  need oxygen on discharge and then can reassess in the office possible Can discharge on 40 mg prednisone and taper by 10 mg/week   GERD Continue on PPI He has had speech evaluation and barium swallow during prior admissions with no significant aspiration noted  Hemoptysis -Likely due to epistaxis Okay to  continue Plavix for now  Rest per primary team. We will make outpatient appointment in pulmonary clinic for 2 weeks    Kara Mead MD. [facility] [place] [facility] Pager : [phone]  If no response to pager , please call [phone] until 7 pm After 7:00 pm call Elink  [phone]     10/13/2021, 1:44 PM

## 2021-10-13 NOTE — Progress Notes (Signed)
SATURATION QUALIFICATIONS: (This note is used to comply with regulatory documentation for home oxygen)  Patient Saturations on Room Air at Rest = 82%  Patient Saturations on Room Air while Ambulating = Unable to assess because patient is not ambulatory.  Put him back in a 5 ltr oxygen ,his saturation went up to 93% at rest.

## 2021-10-13 NOTE — Progress Notes (Signed)
PROGRESS NOTE        PATIENT DETAILS Name: Mark Thomas Age: 71 y.o. Sex: male Date of Birth: 04/17/1951 Admit Date: 10/03/2021 Admitting Physician Bonnell Public, MD BVQ:[id number], Einar Pheasant, DO  Brief Summary: Patient is a 71 y.o.  male history of CAD-s/p PCI PAF-s/p watchman's device, CVA who has numerous hospitalizations for recurrent PNA-presenting with SOB-found to have hypoxia due to multifocal PNA and subsequently admitted to the hospitalist service.    Significant events: 4/05-4/10>> hospitalization for PNA 4/27-4/30>> hospitalization for sepsis/hypoxia due to PNA. 6/04>> admit to [facility]-due to pneumonia. 6/09>> significant radiological worsening-with complete opacification of left lung while on cefepime.  Started on steroids by PCCM.  Significant studies: 4/28>> TTE: EF 28-00%, grade 2 diastolic dysfunction, RVSP 49.5, moderate aortic stenosis 6/04>> CXR: Worsening opacities in the left lung 6/09>> CXR: Significant interval worsening of left lung opacities-near complete opacification of left lung. 6/09>> CT chest: Extensive airspace disease throughout left lung 6/13>> CXR: Continues to have significant opacification of left lung   Significant microbiology data: 6/04>> COVID/INFLUENZA PCR: Negative 6/04>> blood culture: Negative 6/07>> BAL culture: Negative  6/07>> BAL AFB smear: Negative 6/07>> BAL AFB culture: Pending 6/07>> BAL fungal culture: Pending 6/07>> BAL cytology: No malignant cells identified  Procedures: 6/07>> bronchoscopy  Consults: PCCM  Subjective: Stable-no further hemoptysis (had small volume hemoptysis yesterday after I had seen him).  Feels better.  Objective: Vitals: Blood pressure 139/74, pulse 92, temperature 97.6 F (36.4 C), temperature source Oral, resp. rate 18, height _0  (1.854 m), weight 117 kg, SpO2 95 %.   Exam: Gen Exam:Alert awake-not in any distress HEENT:atraumatic, normocephalic Chest: B/L  clear to auscultation anteriorly CVS:S1S2 regular Abdomen:soft non tender, non distended Extremities:no edema Neurology: Non focal Skin: no rash   Pertinent Labs/Radiology:    Latest Ref Rng & Units 10/11/2021   12:27 AM 10/09/2021   12:38 AM 10/07/2021    4:49 AM  CBC  WBC 4.0 - 10.5 K/uL 10.8  10.1  9.1   Hemoglobin 13.0 - 17.0 g/dL 9.0  8.8  8.7   Hematocrit 39.0 - 52.0 % 28.9  27.6  27.1   Platelets 150 - 400 K/uL 172  161  175     Lab Results  Component Value Date   NA 137 10/12/2021   K 3.6 10/12/2021   CL 100 10/12/2021   CO2 30 10/12/2021      Assessment/Plan: Acute hypoxic respiratory failure due to recurrent multifocal pneumonia: Overall stable-unclear why he worsened with complete opacification of left lung while on cefepime in the hospital-?  Had aspiration event.  Thankfully he has stabilized-on just 2 L of oxygen-he will finish off his cefepime today-remains on steroids.  Plans are for possible discharge to SNF on 6/15.  He will likely need home O2-we will ask RN to perform desaturation screen.  Volume status stable-does not require diuretics.  History of recurrent pneumonia: Occurring since COVID-19 April 2021-apparently improved after course of antibiotics/steroids-s/p bronchoscopy on 6/7-BAL cultures negative so far.  Unclear whether recurrent PNA was possible ILD flare.  History of interstitial lung disease: Suspected GERD contributing to his ILD.  Supportive care-on PPI.  Chronic HFpEF: Volume status stable-do not think he requires any further diuretics at this point.  He has a history of orthostatic hypotension.  He was diuresed to ensure negative balance in the setting of  hypoxia/PNA.  SVT: Continue beta-blocker-evaluated by cardiology-not felt to have A-fib-continue beta-blocker.  No longer on amiodarone.  History of PAF-s/p watchman's device 03/2019 and RFA with isolation of all 4 pulmonary veins in 2021: Continue telemetry monitoring.  Per cardiology  note-not felt to have A-fib.  No longer on anticoagulation with watchman's device in place.  History of CAD s/p multiple PCI's: Currently without any anginal symptoms.  Continue aspirin/Plavix/statin/beta-blocker.  History of ASD-s/p closure 01/22/2020  History of moderate aortic stenosis: Stable for outpatient follow-up with cardiology.  History of CVA: No focal deficits.  Continue antiplatelets-statin.  History of sick sinus syndrome-s/p PPM placement: Continue telemetry monitoring.  History of vascular dementia: Appears to be mild-continue Namenda//Seroquel.  Maintain delirium precautions.  History of anxiety/depression: Continue Lexapro  History of orthostatic hypotension: Difficult situation-blood pressure has been fluctuating quite a bit-currently relatively stable-allow some amount of permissive hypertension.  No longer on Florinef-dosage of midodrine reduced to 2.5 mg 3 times daily.  Continue to follow closely.      GERD: Continue PPI  Obesity: Estimated body mass index is 34.03 kg/m as calculated from the following:   Height as of this encounter: _0  (1.854 m).   Weight as of this encounter: 117 kg.   Code status:   Code Status: Full Code   DVT Prophylaxis: enoxaparin (LOVENOX) injection 40 mg Start: 10/04/21 2200   Family Communication: Spouse-Annette-[phone] updated over the phone on 6/13   Disposition Plan: Status is: Inpatient Remains inpatient appropriate because: Recurrent PNA-needs IV cefepime x10 days total.   Planned Discharge Destination: SNF-likely on 6/15.   Diet: Diet Order             Diet Heart Room service appropriate? Yes; Fluid consistency: Thin  Diet effective now                     Antimicrobial agents: Anti-infectives (From admission, onward)    Start     Dose/Rate Route Frequency Ordered Stop   10/05/21 1800  vancomycin (VANCOREADY) IVPB 1250 mg/250 mL  Status:  Discontinued        1,250 mg 166.7 mL/hr over 90 Minutes  Intravenous Every 12 hours 10/05/21 0957 10/07/21 1200   10/04/21 0615  Vancomycin (VANCOCIN) 1,500 mg in sodium chloride 0.9 % 500 mL IVPB        1,500 mg 250 mL/hr over 120 Minutes Intravenous  Once 10/04/21 0600 10/04/21 0926   10/04/21 0605  vancomycin (VANCOCIN) 1000 MG powder       Note to Pharmacy: April Holding C: cabinet override      10/04/21 0605 10/04/21 1814   10/04/21 0605  vancomycin (VANCOCIN) 500 MG powder       Note to Pharmacy: April Holding C: cabinet override      10/04/21 0605 10/04/21 1814   10/04/21 0400  vancomycin (VANCOREADY) IVPB 1500 mg/300 mL  Status:  Discontinued        1,500 mg 150 mL/hr over 120 Minutes Intravenous Every 12 hours 10/03/21 1357 10/05/21 0957   10/03/21 1445  vancomycin (VANCOCIN) IVPB 1000 mg/200 mL premix       See Hyperspace for full Linked Orders Report.   1,000 mg 200 mL/hr over 60 Minutes Intravenous  Once 10/03/21 1335 10/03/21 1938   10/03/21 1400  ceFEPIme (MAXIPIME) 2 g in sodium chloride 0.9 % 100 mL IVPB        2 g 200 mL/hr over 30 Minutes Intravenous Every 8 hours 10/03/21 1356 10/13/21 1359  10/03/21 1345  vancomycin (VANCOCIN) IVPB 1000 mg/200 mL premix       See Hyperspace for full Linked Orders Report.   1,000 mg 200 mL/hr over 60 Minutes Intravenous  Once 10/03/21 1335 10/03/21 1846   10/03/21 1330  vancomycin (VANCOCIN) IVPB 1000 mg/200 mL premix  Status:  Discontinued        1,000 mg 200 mL/hr over 60 Minutes Intravenous  Once 10/03/21 1321 10/03/21 1335   10/03/21 1330  ceFEPIme (MAXIPIME) 2 g in sodium chloride 0.9 % 100 mL IVPB  Status:  Discontinued        2 g 200 mL/hr over 30 Minutes Intravenous  Once 10/03/21 1321 10/03/21 1356   10/03/21 1330  azithromycin (ZITHROMAX) 500 mg in sodium chloride 0.9 % 250 mL IVPB        500 mg 250 mL/hr over 60 Minutes Intravenous  Once 10/03/21 1321 10/03/21 1710        MEDICATIONS: Scheduled Meds:  albuterol  2.5 mg Nebulization BID   allopurinol  100 mg Oral  Daily   aspirin EC  81 mg Oral q AM   atorvastatin  80 mg Oral Daily   bacitracin   Topical BID   cholecalciferol  400 Units Oral Daily   clopidogrel  75 mg Oral Daily   enoxaparin (LOVENOX) injection  40 mg Subcutaneous Q24H   escitalopram  30 mg Oral Daily   guaiFENesin  600 mg Oral BID   hydrocortisone cream   Topical TID   latanoprost  1 drop Both Eyes QHS   lidocaine  1 patch Transdermal Q24H   magnesium oxide  400 mg Oral Daily   memantine  10 mg Oral BID   metoprolol tartrate  37.5 mg Oral BID   midodrine  2.5 mg Oral TID WC   mometasone-formoterol  2 puff Inhalation BID   ondansetron (ZOFRAN) IV  4 mg Intravenous Once   pantoprazole  40 mg Oral QPM   polyethylene glycol  17 g Oral BID   potassium citrate  10 mEq Oral Daily   predniSONE  40 mg Oral Q breakfast   primidone  100 mg Oral BID   QUEtiapine  100 mg Oral QHS   ranolazine  1,000 mg Oral BID   rOPINIRole  4 mg Oral QHS   Continuous Infusions:  sodium chloride 10 mL/hr at 10/13/21 0700   ceFEPime (MAXIPIME) IV Stopped (10/13/21 8828)   PRN Meds:.sodium chloride, acetaminophen, benzonatate, hydrALAZINE, ipratropium-albuterol, oxyCODONE-acetaminophen, sodium chloride   I have personally reviewed following labs and imaging studies  LABORATORY DATA: CBC: Recent Labs  Lab 10/07/21 0449 10/09/21 0038 10/11/21 0027  WBC 9.1 10.1 10.8*  HGB 8.7* 8.8* 9.0*  HCT 27.1* 27.6* 28.9*  MCV 104.2* 101.8* 102.5*  PLT 175 161 172     Basic Metabolic Panel: Recent Labs  Lab 10/07/21 0449 10/08/21 0502 10/09/21 0038 10/10/21 0545 10/11/21 0027 10/12/21 0142  NA 140 139 138  --  136 137  K 4.5 4.1 3.9  --  3.5 3.6  CL 109 111 104  --  98 100  CO2 _0 --  28 30  GLUCOSE 114* 104* 124*  --  151* 133*  BUN 28* 19 17  --  26* 21  CREATININE 0.94 0.77 0.75 0.92 0.93 0.98  CALCIUM 8.7* 8.1* 8.3*  --  8.4* 8.4*  MG 2.1  --   --   --   --   --      GFR:  Estimated Creatinine Clearance: 96.6 mL/min (by  C-G formula based on SCr of 0.98 mg/dL).  Liver Function Tests: No results for input(s): "AST", "ALT", "ALKPHOS", "BILITOT", "PROT", "ALBUMIN" in the last 168 hours.  No results for input(s): "LIPASE", "AMYLASE" in the last 168 hours. No results for input(s): "AMMONIA" in the last 168 hours.  Coagulation Profile: No results for input(s): "INR", "PROTIME" in the last 168 hours.   Cardiac Enzymes: No results for input(s): "CKTOTAL", "CKMB", "CKMBINDEX", "TROPONINI" in the last 168 hours.  BNP (last 3 results) Recent Labs    03/18/21 1004  PROBNP 370     Lipid Profile: No results for input(s): "CHOL", "HDL", "LDLCALC", "TRIG", "CHOLHDL", "LDLDIRECT" in the last 72 hours.  Thyroid Function Tests: No results for input(s): "TSH", "T4TOTAL", "FREET4", "T3FREE", "THYROIDAB" in the last 72 hours.  Anemia Panel: No results for input(s): "VITAMINB12", "FOLATE", "FERRITIN", "TIBC", "IRON", "RETICCTPCT" in the last 72 hours.  Urine analysis:    Component Value Date/Time   COLORURINE AMBER (A) 10/07/2021 2003   APPEARANCEUR CLEAR 10/07/2021 2003   LABSPEC 1.021 10/07/2021 2003   PHURINE 5.0 10/07/2021 2003   GLUCOSEU NEGATIVE 10/07/2021 2003   HGBUR NEGATIVE 10/07/2021 2003   [place] NEGATIVE 10/07/2021 2003   [place] NEGATIVE 10/07/2021 2003   PROTEINUR 30 (A) 10/07/2021 2003   NITRITE NEGATIVE 10/07/2021 2003   LEUKOCYTESUR NEGATIVE 10/07/2021 2003    Sepsis Labs: Lactic Acid, Venous    Component Value Date/Time   LATICACIDVEN 1.3 10/03/2021 1505    MICROBIOLOGY: Recent Results (from the past 240 hour(s))  Culture, blood (Routine x 2)     Status: None   Collection Time: 10/03/21  3:00 PM   Specimen: BLOOD LEFT ARM  Result Value Ref Range Status   Specimen Description   Final    BLOOD LEFT ARM BLOOD Performed at [facility], [address] [place], [place] [address]    Special Requests   Final    Blood Culture adequate volume BOTTLES DRAWN  AEROBIC AND ANAEROBIC Performed at [facility], [address] [place], [place] [address]    Culture   Final    NO GROWTH 5 DAYS Performed at [facility], [place] [address] [place], [place] [address]    Report Status 10/08/2021 FINAL  Final  Resp Panel by RT-PCR (Flu A&B, Covid) Anterior Nasal Swab     Status: None   Collection Time: 10/03/21  3:05 PM   Specimen: Anterior Nasal Swab  Result Value Ref Range Status   SARS Coronavirus 2 by RT PCR NEGATIVE NEGATIVE Final    Comment: (NOTE) SARS-CoV-2 target nucleic acids are NOT DETECTED.  The SARS-CoV-2 RNA is generally detectable in upper respiratory specimens during the acute phase of infection. The lowest concentration of SARS-CoV-2 viral copies this assay can detect is 138 copies/mL. A negative result does not preclude SARS-Cov-2 infection and should not be used as the sole basis for treatment or other patient management decisions. A negative result may occur with  improper specimen collection/handling, submission of specimen other than nasopharyngeal swab, presence of viral mutation(s) within the areas targeted by this assay, and inadequate number of viral copies(<138 copies/mL). A negative result must be combined with clinical observations, patient history, and epidemiological information. The expected result is Negative.  Fact Sheet for Patients:  EntrepreneurPulse.com.au  Fact Sheet for Healthcare Providers:  IncredibleEmployment.be  This test is no t yet approved or cleared by the [place] FDA and  has  been authorized for detection and/or diagnosis of SARS-CoV-2 by FDA under an Emergency Use Authorization (EUA). This EUA will remain  in effect (meaning this test can be used) for the duration of the COVID-19 declaration under Section 564(b)(1) of the Act, 21 U.S.C.section 360bbb-3(b)(1), unless the authorization is terminated  or revoked sooner.        Influenza A by PCR NEGATIVE NEGATIVE Final   Influenza B by PCR NEGATIVE NEGATIVE Final    Comment: (NOTE) The Xpert Xpress SARS-CoV-2/FLU/RSV plus assay is intended as an aid in the diagnosis of influenza from Nasopharyngeal swab specimens and should not be used as a sole basis for treatment. Nasal washings and aspirates are unacceptable for Xpert Xpress SARS-CoV-2/FLU/RSV testing.  Fact Sheet for Patients: EntrepreneurPulse.com.au  Fact Sheet for Healthcare Providers: IncredibleEmployment.be  This test is not yet approved or cleared by the [place] FDA and has been authorized for detection and/or diagnosis of SARS-CoV-2 by FDA under an Emergency Use Authorization (EUA). This EUA will remain in effect (meaning this test can be used) for the duration of the COVID-19 declaration under Section 564(b)(1) of the Act, 21 U.S.C. section 360bbb-3(b)(1), unless the authorization is terminated or revoked.  Performed at [facility], [address] [place], [place] [address]   Culture, blood (Routine x 2)     Status: None   Collection Time: 10/04/21  2:37 PM   Specimen: BLOOD LEFT HAND  Result Value Ref Range Status   Specimen Description BLOOD LEFT HAND  Final   Special Requests   Final    BOTTLES DRAWN AEROBIC AND ANAEROBIC Blood Culture adequate volume   Culture   Final    NO GROWTH 5 DAYS Performed at [facility], [place] [address], [place], [place] [address]    Report Status 10/09/2021 FINAL  Final  Fungus Culture With Stain     Status: None (Preliminary result)   Collection Time: 10/06/21 12:42 PM   Specimen: PATH Cytology Bronchial lavage; Body Fluid  Result Value Ref Range Status   Fungus Stain Final report  Final    Comment: (NOTE) Performed At: [facility] [place], [place] [id number] Rush Farmer MD FO:[id number]    Fungus (Mycology) Culture PENDING  Incomplete   Fungal Source BRONCHIAL  ALVEOLAR LAVAGE  Final    Comment: LINGULA Performed at [facility], [place] [address] [place], [place] [address]   Acid Fast Smear (AFB)     Status: None   Collection Time: 10/06/21 12:42 PM   Specimen: PATH Cytology Bronchial lavage; Body Fluid  Result Value Ref Range Status   AFB Specimen Processing Concentration  Final   Acid Fast Smear Negative  Final    Comment: (NOTE) Performed At: [facility] [place], [place] [id number] Rush Farmer MD MO:[id number]    Source (AFB) BRONCHIAL ALVEOLAR LAVAGE  Final    Comment: LINGULA Performed at [facility], [place] [address] [place], [place] [address]   Culture, BAL-quantitative w Gram Stain     Status: None   Collection Time: 10/06/21 12:42 PM   Specimen: Bronchoalveolar Lavage  Result Value Ref Range Status   Specimen Description BRONCHIAL ALVEOLAR LAVAGE  Final   Special Requests LINGULA  Final   Gram Stain   Final    RARE WBC PRESENT,BOTH PMN AND MONONUCLEAR NO ORGANISMS SEEN    Culture   Final    NO GROWTH 2 DAYS Performed at [facility],  [address] [place], [place] [address]    Report Status 10/08/2021 FINAL  Final  Fungus Culture Result     Status: None   Collection Time: 10/06/21 12:42 PM  Result Value Ref Range Status   Result 1 Comment  Final    Comment: (NOTE) KOH/Calcofluor preparation:  no fungus observed. Performed At: [facility] [place], [place] [id number] Rush Farmer MD GB:[id number]     RADIOLOGY STUDIES/RESULTS: DG CHEST PORT 1 VIEW  Result Date: 10/12/2021 CLINICAL DATA:  Pneumonia. EXAM: PORTABLE CHEST 1 VIEW COMPARISON:  October 08, 2021. FINDINGS: Stable cardiomediastinal silhouette. Left-sided pacemaker is unchanged in position. Continued diffuse opacification of left lung is noted consistent with pneumonia. Mild right basilar atelectasis or infiltrate is noted. Bony thorax is unremarkable. IMPRESSION: Stable continued diffuse  opacification of left lung consistent with pneumonia. Electronically Signed   By: Marijo Conception M.D.   On: 10/12/2021 08:12     LOS: 9 days   Oren Binet, MD  [facility]    To contact the attending provider between 7A-7P or the covering provider during after hours 7P-7A, please log into the web site [email or web address] and access using universal [place] password for that web site. If you do not have the password, please call the hospital operator.  10/13/2021, 1:11 PM

## 2021-10-13 NOTE — TOC Progression Note (Signed)
Transition of Care [facility]) - Progression Note    Patient Details  Name: Mark Thomas MRN: [id number] Date of Birth: 06-04-1950  [facility]) CM/SW [place], LCSW Phone Number: 10/13/2021, 12:27 PM  Clinical Narrative:    CSW provided SNF bed offers to patient and spouse. Preference reported for a private room at [facility]. CSW will follow up.    Expected Discharge Plan: [place] Barriers to Discharge: Continued Medical Work up, SNF Pending bed offer  Expected Discharge Plan and Services Expected Discharge Plan: [place] In-house Referral: Clinical Social Work   Post Acute Care Choice: [place] Living arrangements for the past 2 months: [place] Determinants of Health (SDOH) Interventions    Readmission Risk Interventions    10/12/2021    3:48 PM  Readmission Risk Prevention Plan  Transportation Screening Complete  Medication Review (RN Care Manager) Complete  PCP or Specialist appointment within 3-5 days of discharge Complete  HRI or [place] Complete  SW Recovery Care/Counseling Consult Complete  Palliative Care Screening Not [place] Complete

## 2021-10-13 NOTE — Progress Notes (Signed)
Occupational Therapy Treatment Patient Details Name: Mark Thomas MRN: [id number] DOB: Apr 23, 1951 Today's Date: 10/13/2021   History of present illness 71 y.o. male who presented to outside ED 10/04/21 with worsening shortness of breath and cough. recurrent pna (has had 2 recent hospitalizations for pna); +afib with RVR; 6/7 bronchoscopy with lavage; PMH includes: anxiety, CAD, depression, HTN, MI, stroke, vascular dementia, CKD, afib (not on anticoagulation), and orthostatic hypotension.   OT comments  Pt readily willing to get OOB to chair. No physical assist for bed mobility, min assist to stand with RW and min guard assist to transfer to chair. Pt completed grooming with set up in sitting and UB dressing with min assist. Sp02 noted to drop to 82% on RA with bed mobility, replaced 3L 02 with Sp02 at 93% at rest in chair. Updated d/c recommendation to SNF for ST rehab. Pt is agreeable.    Recommendations for follow up therapy are one component of a multi-disciplinary discharge planning process, led by the attending physician.  Recommendations may be updated based on patient status, additional functional criteria and insurance authorization.    Follow Up Recommendations  Skilled nursing-short term rehab (<3 hours/day)    Assistance Recommended at Discharge Frequent or constant Supervision/Assistance  Patient can return home with the following  A little help with walking and/or transfers;A lot of help with bathing/dressing/bathroom;Assistance with cooking/housework;Direct supervision/assist for medications management;Direct supervision/assist for financial management;Assist for transportation;Help with stairs or ramp for entrance   Equipment Recommendations  None recommended by OT    Recommendations for Other Services      Precautions / Restrictions Precautions Precautions: Fall;Other (comment) Restrictions Weight Bearing Restrictions: No       Mobility Bed Mobility Overal bed  mobility: Needs Assistance Bed Mobility: Supine to Sit     Supine to sit: Supervision          Transfers Overall transfer level: Needs assistance Equipment used: Rolling walker (2 wheels) Transfers: Sit to/from Stand Sit to Stand: Min assist     Step pivot transfers: Min guard     General transfer comment: with RW, cues for hand placement, very minimal assist to rise into standing     Balance Overall balance assessment: Needs assistance   Sitting balance-Leahy Scale: Fair     Standing balance support: Bilateral upper extremity supported, During functional activity, Reliant on assistive device for balance Standing balance-Leahy Scale: Poor                             ADL either performed or assessed with clinical judgement   ADL Overall ADL's : Needs assistance/impaired     Grooming: Wash/dry hands;Wash/dry face;Oral care;Sitting;Set up           Upper Body Dressing : Minimal assistance;Sitting                          Extremity/Trunk Assessment              Vision       Perception     Praxis      Cognition Arousal/Alertness: Awake/alert Behavior During Therapy: Flat affect Overall Cognitive Status: History of cognitive impairments - at baseline                                 General Comments: no anxiety this visit, readily willing to get  OOB to chair with OT        Exercises      Shoulder Instructions       General Comments      Pertinent Vitals/ Pain       Pain Assessment Pain Assessment: No/denies pain  Home Living                                          Prior Functioning/Environment              Frequency  Min 2X/week        Progress Toward Goals  OT Goals(current goals can now be found in the care plan section)  Progress towards OT goals: Progressing toward goals  Acute Rehab OT Goals OT Goal Formulation: With patient Time For Goal Achievement:  10/22/21 Potential to Achieve Goals: [facility] Discharge plan needs to be updated    Co-evaluation                 AM-PAC OT "6 Clicks" Daily Activity     Outcome Measure   Help from another person eating meals?: None Help from another person taking care of personal grooming?: A Little Help from another person toileting, which includes using toliet, bedpan, or urinal?: A Lot Help from another person bathing (including washing, rinsing, drying)?: A Lot Help from another person to put on and taking off regular upper body clothing?: A Little Help from another person to put on and taking off regular lower body clothing?: A Lot 6 Click Score: 16    End of Session Equipment Utilized During Treatment: Gait belt;Rolling walker (2 wheels);Oxygen  OT Visit Diagnosis: Unsteadiness on feet (R26.81);Other abnormalities of gait and mobility (R26.89);Muscle weakness (generalized) (M62.81);Other symptoms and signs involving cognitive function   Activity Tolerance Patient tolerated treatment well   Patient Left in chair;with call bell/phone within reach;with family/visitor present   Nurse Communication Other (comment) (RN in room to test 02)        Time: 1511-1536 OT Time Calculation (min): 25 min  Charges: OT General Charges $OT Visit: 1 Visit OT Treatments $Self Care/Home Management : 23-37 mins  Cleta Alberts, OTR/L Acute Rehabilitation Services Office: [phone]   Malka So 10/13/2021, 4:00 PM

## 2021-10-13 NOTE — Plan of Care (Signed)
  Problem: Education: Goal: Knowledge of disease or condition will improve Outcome: Progressing   Problem: Activity: Goal: Ability to tolerate increased activity will improve Outcome: Progressing Goal: Will verbalize the importance of balancing activity with adequate rest periods Outcome: Progressing   Problem: Respiratory: Goal: Ability to maintain a clear airway will improve Outcome: Progressing   Problem: Respiratory: Goal: Ability to maintain adequate ventilation will improve Outcome: Progressing   Problem: Education: Goal: Knowledge of General Education information will improve Description: Including pain rating scale, medication(s)/side effects and non-pharmacologic comfort measures Outcome: Progressing

## 2021-10-14 DIAGNOSIS — R042 Hemoptysis: Secondary | ICD-10-CM | POA: Diagnosis not present

## 2021-10-14 DIAGNOSIS — Z8673 Personal history of transient ischemic attack (TIA), and cerebral infarction without residual deficits: Secondary | ICD-10-CM | POA: Diagnosis not present

## 2021-10-14 DIAGNOSIS — J189 Pneumonia, unspecified organism: Secondary | ICD-10-CM | POA: Diagnosis not present

## 2021-10-14 DIAGNOSIS — J181 Lobar pneumonia, unspecified organism: Secondary | ICD-10-CM | POA: Diagnosis not present

## 2021-10-14 DIAGNOSIS — J9601 Acute respiratory failure with hypoxia: Secondary | ICD-10-CM | POA: Diagnosis not present

## 2021-10-14 MED ORDER — POLYETHYLENE GLYCOL 3350 17 G PO PACK: 17.0000 g | PACK | Freq: Two times a day (BID) | ORAL | 0 refills | Status: DC

## 2021-10-14 MED ORDER — GUAIFENESIN ER 600 MG PO TB12: 600.0000 mg | ORAL_TABLET | Freq: Two times a day (BID) | ORAL | Status: DC

## 2021-10-14 MED ORDER — MIDODRINE HCL 5 MG PO TABS: 2.5000 mg | ORAL_TABLET | Freq: Three times a day (TID) | ORAL | 3 refills | Status: AC

## 2021-10-14 MED ORDER — BENZONATATE 100 MG PO CAPS: 100.0000 mg | ORAL_CAPSULE | Freq: Three times a day (TID) | ORAL | 0 refills | Status: DC | PRN

## 2021-10-14 MED ORDER — LEVOFLOXACIN 750 MG PO TABS: 750.0000 mg | ORAL_TABLET | Freq: Every day | ORAL | 0 refills | Status: AC

## 2021-10-14 MED ORDER — PREDNISONE 20 MG PO TABS: ORAL_TABLET | ORAL | Status: DC

## 2021-10-14 MED ORDER — OXYCODONE-ACETAMINOPHEN 5-325 MG PO TABS: 1.0000 | ORAL_TABLET | Freq: Four times a day (QID) | ORAL | 0 refills | Status: DC | PRN

## 2021-10-14 NOTE — Progress Notes (Signed)
NAME:  Mark Thomas, MRN:  [id number], DOB:  01/13/1951, LOS: 61 ADMISSION DATE:  10/03/2021, CONSULTATION DATE: 10/05/2021 REFERRING MD: [facility], CHIEF COMPLAINT: Refractory pneumonia  History of Present Illness:  71 year old male who is a never smoker with history of hypertension coronary disease with a stent along with CVA, gastroesophageal reflux disease, chronic kidney disease with current creatinine .90.  He has a history of atrial fibrillation followed by cardiology along with a permanent pacemaker and currently being paced.  He reports recently being treated for pneumonia with antibiotics with some improvement but signs and symptoms of infection have returned he noticed increasing orthopnea and generalized failure to thrive with episode of hemoptysis without fevers chills or sweats.  He did had COVID in 12/22 and has had problems with respiratory difficulty since.  He has been followed in the office by Dr. Erin Fulling and pulmonary critical care has been asked to evaluate while in the hospital due to his ongoing symptoms and been refractory to outpatient treatment.  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[facility] , has osteotomy flaps   Past Medical History:  Diagnosis Date   Anxiety    Coronary artery disease    Depression    Essential tremor    GERD (gastroesophageal reflux disease)    High cholesterol    Hypertension    Myocardial infarct (HCC)    Orthostatic hypotension    Stroke [facility])    Vascular dementia ([place])      Significant Hospital Events: Including procedures, antibiotic start and stop dates in addition to other pertinent events   6/6-pulmonary consult 6/7-bronchoscopy 6/12 epistaxis 6/13 hemoptysis    Interim History / Subjective:   No further episodes of epistaxis or hemoptysis. Remains on 3 L nasal cannula, able to lie supine, prefers to lie on his right side  Objective   Blood pressure (!) 133/94, pulse 97, temperature 97.9 F (36.6  C), temperature source Oral, resp. rate 15, height 6' 1" (1.854 m), weight 117 kg, SpO2 92 %.       No intake or output data in the 24 hours ending 10/14/21 1213  Filed Weights   10/03/21 1229 10/06/21 0616 10/06/21 1154  Weight: 117.9 kg 117 kg 117 kg    Examination: General: Adult male, resting in bed, in NAD. Neuro: A&O x 3, no deficits. HEENT: [place]/AT. Sclerae anicteric. EOMI. Cardiovascular: S1-S2 regular, 3 of/6 ESM at base Lungs: No accessory muscle use, decreased breath sounds on the left with scattered crackles, clear on right Abdomen: Soft nontender obese abdomen Musculoskeletal: No gross deformities, no edema.  Skin: Intact, warm, no rashes.   CT chest on 6/9  extensive airspace disease bilaterally left greater than right Chest x-ray 6/13 extensive airspace disease on the left, no effusions, clear on right  Labs show mild hypokalemia, stable WBC count  Assessment & Plan:   Acute respiratory failure with hypoxia Recurrent pneumonia  -review of imaging shows appearance of left lower lobe infiltrate on 5/30, worsens into lobar consolidation on 6/4, bronchoscopy on 6/7 and then develops left white out on follow-up imaging 6/9. Aspiration possible, wife reports intermittent episodes of aspiration, previous swallow evaluation has been negative Baseline mild interstitial lung disease CT reviewed with extensive consolidative changes mostly on the left consistent with multifocal pneumonia BAL cultures negative so far, follow-up fungal culture given his prior history of fungal sinusitis Completed cefepime for 10 days,  transition to Levaquin for another 5 days. Discharged on oxygen 3 L and  reassess on office appointment Can discharge on 40 mg prednisone and taper by 10 mg/week   GERD Continue on PPI He had swallow evaluation evaluation and barium swallow during prior admissions with no significant aspiration noted  Hemoptysis -Likely due to epistaxis Okay to continue  Plavix for now  Rest per primary team. Outpatient office appointment has been made    Kara Mead MD. FCCP. [facility] Pager : [phone]  If no response to pager , please call [phone] until 7 pm After 7:00 pm call Elink  [phone]     10/14/2021, 12:13 PM

## 2021-10-14 NOTE — Progress Notes (Signed)
[facility] night cross cover note:  I was notified by RN that the patient is refusing his Bacitracin and hydrocortisone topicals to the forehead this evening, with the patient conveying that the treatment duration for these topicals has been completed.   I subsequently discontinued the orders for Bacitracin and hydrocortisone topicals.     Babs Bertin, DO Hospitalist

## 2021-10-14 NOTE — Discharge Summary (Signed)
PATIENT DETAILS Name: Mark Thomas Age: 71 y.o. Sex: male Date of Birth: [birth year] MRN: [id number]. Admitting Physician: Bonnell Public, MD HEN:[id number], Einar Pheasant, DO  Admit Date: 10/03/2021 Discharge date: 10/14/2021  Recommendations for Outpatient Follow-up:  Follow up with PCP in 1-2 weeks Please obtain CMP/CBC in one week Pleaseensure follow-up with pulmonology   Continue oxygen-2-3 L at rest-5 L with ambulation-and reassess oxygen requirement at the follow-up visit with pulmonology. Repeat CT chest/CXR in 4 to 6 weeks to assess resolution of infiltrates Has orthostatic hypotension and resting hypertension-have discontinued fludrocortisone-on midodrine-please adjust accordingly.  Allow some amount of resting hypertension. Follow BAL AFB cultures and fungal cultures.  Admitted From:  Home  Disposition: Skilled nursing facility   Discharge Condition: fair  CODE STATUS:   Code Status: Full Code   Diet recommendation:  Diet Order             Diet - low sodium heart healthy           Diet Heart Room service appropriate? Yes; Fluid consistency: Thin  Diet effective now                    Brief Summary: Patient is a 71 y.o.  male history of CAD-s/p PCI PAF-s/p watchman's device, CVA who has numerous hospitalizations for recurrent PNA-presenting with SOB-found to have hypoxia due to multifocal PNA and subsequently admitted to the hospitalist service.     Significant events: 4/05-4/10>> hospitalization for PNA 4/27-4/30>> hospitalization for sepsis/hypoxia due to PNA. 6/04>> admit to [facility]-due to pneumonia. 6/09>> significant radiological worsening-with complete opacification of left lung while on cefepime.  Started on steroids by PCCM.   Significant studies: 4/28>> TTE: EF 53-61%, grade 2 diastolic dysfunction, RVSP 49.5, moderate aortic stenosis 6/04>> CXR: Worsening opacities in the left lung 6/09>> CXR: Significant interval worsening of left lung  opacities-near complete opacification of left lung. 6/09>> CT chest: Extensive airspace disease throughout left lung 6/13>> CXR: Continues to have significant opacification of left lung     Significant microbiology data: 6/04>> COVID/INFLUENZA PCR: Negative 6/04>> blood culture: Negative 6/07>> BAL culture: Negative  6/07>> BAL AFB smear: Negative 6/07>> BAL AFB culture: Pending 6/07>> BAL fungal culture: Pending 6/07>> BAL cytology: No malignant cells identified   Procedures: 6/07>> bronchoscopy   Consults: PCCM  Brief Hospital Course: Acute hypoxic respiratory failure due to recurrent multifocal pneumonia: Overall stable-unclear why he worsened with complete opacification of left lung while on cefepime in the hospital-?  Had aspiration event.  He is relatively stable on just 2 L of oxygen-and is overall clinically improved.  Has completed a 10-day course of cefepime-after discussion with PCCM-he is now on additional 5 more days of oral levofloxacin.  No signs of volume overload-received several days of IV Lasix.  Requires home O2-and reimaging in 4 to 6 weeks to ensure resolution of his infiltrates.  After discussion with PCCM-he will continue on prednisone-and taper by 10 mg every week.  Please reassess whether he requires home O2 at next visit with PCP or PCCM MD.     History of recurrent pneumonia: Occurring since COVID-19 April 2021-apparently improved after course of antibiotics/steroids-s/p bronchoscopy on 6/7-BAL cultures negative so far.  Unclear whether recurrent PNA was possible ILD flare.   History of interstitial lung disease: Suspected GERD contributing to his ILD.  Supportive care-on PPI.   Chronic HFpEF: Volume status stable-do not think he requires any further diuretics at this point.  He has a history of orthostatic hypotension.  He was diuresed to ensure negative balance in the setting of hypoxia/PNA.   SVT: Continue beta-blocker-evaluated by cardiology-not felt to  have A-fib-continue beta-blocker.  No longer on amiodarone.   History of PAF-s/p watchman's device 03/2019 and RFA with isolation of all 4 pulmonary veins in 2021: Continue telemetry monitoring.  Per cardiology note-not felt to have A-fib.  No longer on anticoagulation with watchman's device in place.   History of CAD s/p multiple PCI's: Currently without any anginal symptoms.  Continue aspirin/Plavix/statin/beta-blocker.   History of ASD-s/p closure 01/22/2020   History of moderate aortic stenosis: Stable for outpatient follow-up with cardiology.  History of CVA: No focal deficits.  Continue antiplatelets-statin.   History of sick sinus syndrome-s/p PPM placement: Continue telemetry monitoring.   History of vascular dementia: Appears to be mild-continue Namenda//Seroquel.    History of anxiety/depression: Continue Lexapro   History of orthostatic hypotension: Difficult situation-blood pressure has been fluctuating quite a bit-was persistently elevated in the 160s-170s range for the past few days-have increased dosage of metoprolol-stop Florinef-and have decreased midodrine to 2.5 mg 3 times daily with holding parameters.  Follow closely.     GERD: Continue PPI   Obesity: Estimated body mass index is 34.03 kg/m as calculated from the following:   Height as of this encounter: _0  (1.854 m).   Weight as of this encounter: 117 kg.    Discharge Diagnoses:  Principal Problem:   Acute respiratory failure with hypoxia (HCC) Active Problems:   Orthostatic hypotension   CAD (coronary artery disease)   Pacemaker   Lobar pneumonia, unspecified organism ([place])   History of CVA (cerebrovascular accident)   Hemoptysis   Supraventricular tachycardia [facility])   Discharge Instructions:  Activity:  As tolerated with Full fall precautions use walker/cane & assistance as needed   Discharge Instructions     Call MD for:  difficulty breathing, headache or visual disturbances   Complete by:  As directed    Diet - low sodium heart healthy   Complete by: As directed    Discharge instructions   Complete by: As directed    Follow with Primary MD  Luetta Nutting, DO in 1-2 weeks  Please follow-up with your pulmonologist in the next 2 weeks.    You will require a repeat two-view chest x-ray or a CT chest and 4 to 6 weeks.    Please get a complete blood count and chemistry panel checked by your Primary MD at your next visit, and again as instructed by your Primary MD.  Get Medicines reviewed and adjusted: Please take all your medications with you for your next visit with your Primary MD  Laboratory/radiological data: Please request your Primary MD to go over all hospital tests and procedure/radiological results at the follow up, please ask your Primary MD to get all Hospital records sent to his/her office.  In some cases, they will be blood work, cultures and biopsy results pending at the time of your discharge. Please request that your primary care M.D. follows up on these results.  Also Note the following: If you experience worsening of your admission symptoms, develop shortness of breath, life threatening emergency, suicidal or homicidal thoughts you must seek medical attention immediately by calling 911 or calling your MD immediately  if symptoms less severe.  You must read complete instructions/literature along with all the possible adverse reactions/side effects for all the Medicines you take and that have been prescribed to you. Take any new Medicines after you have completely understood and  accpet all the possible adverse reactions/side effects.   Do not drive when taking Pain medications or sleeping medications (Benzodaizepines)  Do not take more than prescribed Pain, Sleep and Anxiety Medications. It is not advisable to combine anxiety,sleep and pain medications without talking with your primary care practitioner  Special Instructions: If you have smoked or chewed  Tobacco  in the last 2 yrs please stop smoking, stop any regular Alcohol  and or any Recreational drug use.  Wear Seat belts while driving.  Please note: You were cared for by a hospitalist during your hospital stay. Once you are discharged, your primary care physician will handle any further medical issues. Please note that NO REFILLS for any discharge medications will be authorized once you are discharged, as it is imperative that you return to your primary care physician (or establish a relationship with a primary care physician if you do not have one) for your post hospital discharge needs so that they can reassess your need for medications and monitor your lab values.   Increase activity slowly   Complete by: As directed    No wound care   Complete by: As directed       Allergies as of 10/14/2021       Reactions   Dilaudid [hydromorphone] Other (See Comments)   Respiratory Arrest!!!!   Bactrim [sulfamethoxazole-trimethoprim] Rash   Benadryl [diphenhydramine] Other (See Comments)   Muscle spasms   Phenobarbital Other (See Comments)   From childhood- reaction not recalled at this time   Penicillins Other (See Comments)   From childhood- reaction not recalled at this time        Medication List     STOP taking these medications    azithromycin 250 MG tablet Commonly known as: ZITHROMAX   FLUDROCORTISONE ACETATE PO   potassium citrate 10 MEQ (1080 MG) SR tablet Commonly known as: UROCIT-K   promethazine-dextromethorphan 6.25-15 MG/5ML syrup Commonly known as: PROMETHAZINE-DM       TAKE these medications    albuterol 108 (90 Base) MCG/ACT inhaler Commonly known as: VENTOLIN HFA Inhale 2 puffs into the lungs every 6 (six) hours as needed for wheezing or shortness of breath. What changed: Another medication with the same name was changed. Make sure you understand how and when to take each.   albuterol (2.5 MG/3ML) 0.083% nebulizer solution Commonly known as:  PROVENTIL Take 3 mLs (2.5 mg total) by nebulization every 6 (six) hours as needed for wheezing or shortness of breath. What changed:  when to take this reasons to take this   allopurinol 100 MG tablet Commonly known as: ZYLOPRIM Take 100 mg by mouth daily.   aspirin EC 81 MG tablet Take 81 mg by mouth daily.   atorvastatin 80 MG tablet Commonly known as: LIPITOR Take 80 mg by mouth daily.   b complex vitamins capsule Take 1 capsule by mouth daily.   benzonatate 100 MG capsule Commonly known as: TESSALON Take 1 capsule (100 mg total) by mouth 3 (three) times daily as needed for cough.   clopidogrel 75 MG tablet Commonly known as: PLAVIX Take 75 mg by mouth daily.   escitalopram 20 MG tablet Commonly known as: LEXAPRO Take 1.5 tablets (30 mg total) by mouth daily.   ferrous sulfate 325 (65 FE) MG EC tablet Take 1 tablet (325 mg total) by mouth in the morning and at bedtime.   guaiFENesin 600 MG 12 hr tablet Commonly known as: MUCINEX Take 1 tablet (600 mg total) by mouth  2 (two) times daily.   latanoprost 0.005 % ophthalmic solution Commonly known as: XALATAN Place 1 drop into both eyes at bedtime.   levofloxacin 750 MG tablet Commonly known as: LEVAQUIN Take 1 tablet (750 mg total) by mouth daily for 3 doses. Start taking on: October 15, 2021   magnesium oxide 400 MG tablet Commonly known as: MAG-OX Take 400 mg by mouth daily.   memantine 10 MG tablet Commonly known as: NAMENDA TAKE 1 TABLET TWICE A DAY   midodrine 5 MG tablet Commonly known as: PROAMATINE Take 0.5 tablets (2.5 mg total) by mouth 3 (three) times daily with meals. What changed: how much to take   mometasone-formoterol 200-5 MCG/ACT Aero Commonly known as: DULERA Inhale 2 puffs into the lungs 2 (two) times daily.   nitroGLYCERIN 0.4 MG/SPRAY spray Commonly known as: NITROLINGUAL Place 1 spray under the tongue every 5 (five) minutes x 3 doses as needed for chest pain.    oxyCODONE-acetaminophen 5-325 MG tablet Commonly known as: PERCOCET/ROXICET Take 1 tablet by mouth every 6 (six) hours as needed for moderate pain.   pantoprazole 40 MG tablet Commonly known as: PROTONIX Take 1 tablet (40 mg total) by mouth every evening.   polyethylene glycol 17 g packet Commonly known as: MIRALAX / GLYCOLAX Take 17 g by mouth 2 (two) times daily.   predniSONE 20 MG tablet Commonly known as: DELTASONE 40 mg p.o. daily for 1 week, then 30 mg p.o. daily for 1 week, 20 mg p.o. daily for 1 week, 10 mg p.o. daily for 1 week and then stop. Start taking on: October 15, 2021   primidone 50 MG tablet Commonly known as: MYSOLINE Take 2 tablets (100 mg total) by mouth 2 (two) times daily.   QUEtiapine 50 MG tablet Commonly known as: SEROQUEL Take 2 tablets (100 mg total) by mouth at bedtime.   ranolazine 1000 MG SR tablet Commonly known as: RANEXA Take 1,000 mg by mouth in the morning and at bedtime.   rOPINIRole 4 MG 24 hr tablet Commonly known as: REQUIP XL Take 1 tablet (4 mg total) by mouth at bedtime.   triamcinolone ointment 0.1 % Commonly known as: KENALOG Apply 1 application. topically daily as needed (to affected areas- for psoriasis flares).   Vitamin D3 10 MCG (400 UNIT) Caps Take 400 Units by mouth in the morning.        Allergies  Allergen Reactions   Dilaudid [Hydromorphone] Other (See Comments)    Respiratory Arrest!!!!   Bactrim [Sulfamethoxazole-Trimethoprim] Rash   Benadryl [Diphenhydramine] Other (See Comments)    Muscle spasms   Phenobarbital Other (See Comments)    From childhood- reaction not recalled at this time   Penicillins Other (See Comments)    From childhood- reaction not recalled at this time     Other Procedures/Studies: DG CHEST PORT 1 VIEW  Result Date: 10/12/2021 CLINICAL DATA:  Pneumonia. EXAM: PORTABLE CHEST 1 VIEW COMPARISON:  October 08, 2021. FINDINGS: Stable cardiomediastinal silhouette. Left-sided pacemaker is  unchanged in position. Continued diffuse opacification of left lung is noted consistent with pneumonia. Mild right basilar atelectasis or infiltrate is noted. Bony thorax is unremarkable. IMPRESSION: Stable continued diffuse opacification of left lung consistent with pneumonia. Electronically Signed   By: Marijo Conception M.D.   On: 10/12/2021 08:12   CT CHEST WO CONTRAST  Result Date: 10/08/2021 CLINICAL DATA:  Evaluate pneumonia. EXAM: CT CHEST WITHOUT CONTRAST TECHNIQUE: Multidetector CT imaging of the chest was performed following the standard protocol  without IV contrast. RADIATION DOSE REDUCTION: This exam was performed according to the departmental dose-optimization program which includes automated exposure control, adjustment of the mA and/or kV according to patient size and/or use of iterative reconstruction technique. COMPARISON:  06/22/2021 FINDINGS: Cardiovascular: Mild cardiac enlargement. No pericardial effusion. Aortic atherosclerosis and multi vessel coronary artery calcification. Left chest wall pacer is noted with leads in the right atrial appendage and right ventricle. Mediastinum/Nodes: Thyroid gland and trachea appear unremarkable. Mild circumferential wall thickening of the distal esophagus is noted. Small to moderate hiatal hernia noted. Multiple prominent (non pathologically enlarged) mediastinal lymph nodes are identified and appear increased in size from previous exam. For example, index right paratracheal lymph node measures 1.2 cm, image 19/3. Previously 0.9 cm. Left paratracheal lymph node measures 1.1 cm, image 28/3. Previously 0.6 cm. Lungs/Pleura: Small bilateral pleural effusions are identified, new from previous exam. There is a background interstitial abnormality with lower lung zone predominant interstitial reticulation and septal thickening. Extensive airspace and ground-glass disease is identified throughout the left upper and lower lobes which is concerning for multifocal  infection. Posterior and medial right upper lobe ground-glass and airspace opacification is also identified and concerning for inflammation/infection. Patchy ground-glass attenuation is noted within the right middle lobe. Within the right lower lobe there is increased soft tissue thickening of the peribronchovascular interstitium. Upper Abdomen: No acute abnormality. Benign Bosniak class 1 cyst arises off the upper pole of the right kidney measuring 2 cm. No follow-up recommended. Musculoskeletal: No chest wall mass or suspicious bone lesions identified. IMPRESSION: 1. Extensive airspace and ground-glass disease identified throughout the left upper and lower lobes as well as posterior and medial right upper lobe. Findings are concerning for multifocal infection. 2. Small bilateral pleural effusions, new from previous exam. 3. Small to moderate hiatal hernia with mild circumferential wall thickening of the distal esophagus. Correlate for any clinical signs or symptoms of esophagitis. 4. Increased size of mediastinal lymph nodes which is likely reactive in the setting of multifocal pneumonia. 5. Coronary artery calcifications. 6. Aortic Atherosclerosis (ICD10-I70.0). Electronically Signed   By: Kerby Moors M.D.   On: 10/08/2021 09:43   DG Chest Port 1 View  Result Date: 10/08/2021 CLINICAL DATA:  Shortness of breath. EXAM: PORTABLE CHEST 1 VIEW COMPARISON:  Radiograph October 03, 2021 FINDINGS: Left chest pacemaker with leads overlying the right atrium right ventricle. The heart size and mediastinal contours are obscured. Significant increase in the left lung opacities now with near complete opacification. Mild right-sided interstitial thickening. No acute osseous abnormality. IMPRESSION: Significant interval increase in the left lung opacities now with near complete opacification concern for worsening pneumonia. Mild right-sided interstitial thickening. Electronically Signed   By: Dahlia Bailiff M.D.   On:  10/08/2021 08:36   DG Chest Port 1 View  Result Date: 10/03/2021 CLINICAL DATA:  Dry cough for 1 week EXAM: PORTABLE CHEST 1 VIEW COMPARISON:  09/28/2021 FINDINGS: Left chest cardiac device with leads overlying the right atrium and ventricle. Increased heterogeneous opacities in the left lung, now involving the left upper lung. No pleural effusion or pneumothorax. Unchanged cardiac and mediastinal contours. No acute osseous abnormality. IMPRESSION: Increased heterogeneous opacities in the left lung, now involving the left upper lung, concerning for worsening pneumonia. Electronically Signed   By: Merilyn Baba M.D.   On: 10/03/2021 13:06   DG Chest 2 View  Result Date: 09/29/2021 CLINICAL DATA:  Shortness of breath.  Cough EXAM: CHEST - 2 VIEW COMPARISON:  Chest radiograph 09/06/2021 FINDINGS:  Multi lead pacer apparatus overlies the left hemithorax, leads are stable in position. Interval development of patchy consolidation left mid lower lung. No pleural effusion or pneumothorax. Thoracic spine degenerative changes. IMPRESSION: Findings most compatible with left lower lung pneumonia. Followup PA and lateral chest X-ray is recommended in 3-4 weeks following trial of antibiotic therapy to ensure resolution and exclude underlying malignancy. Electronically Signed   By: Lovey Newcomer M.D.   On: 09/29/2021 20:52     TODAY-DAY OF DISCHARGE:  Subjective:   Mark Thomas today has no headache,no chest abdominal pain,no new weakness tingling or numbness, feels much better wants to go home today.  Objective:   Blood pressure (!) 149/91, pulse 93, temperature 97.9 F (36.6 C), temperature source Oral, resp. rate 13, height _0  (1.854 m), weight 117 kg, SpO2 94 %. No intake or output data in the 24 hours ending 10/14/21 0916 Filed Weights   10/03/21 1229 10/06/21 0616 10/06/21 1154  Weight: 117.9 kg 117 kg 117 kg    Exam: Awake Alert, Oriented *3, No new F.N deficits, Normal  affect [place].AT,PERRAL Supple Neck,No JVD, No cervical lymphadenopathy appriciated.  Symmetrical Chest wall movement, Good air movement bilaterally, CTAB RRR,No Gallops,Rubs or new Murmurs, No Parasternal Heave +ve B.Sounds, Abd Soft, Non tender, No organomegaly appriciated, No rebound -guarding or rigidity. No Cyanosis, Clubbing or edema, No new Rash or bruise   PERTINENT RADIOLOGIC STUDIES: No results found.   PERTINENT LAB RESULTS: CBC: No results for input(s): "WBC", "HGB", "HCT", "PLT" in the last 72 hours. CMET CMP     Component Value Date/Time   NA 137 10/12/2021 0142   NA 137 03/29/2021 1158   K 3.6 10/12/2021 0142   CL 100 10/12/2021 0142   CO2 30 10/12/2021 0142   GLUCOSE 133 (H) 10/12/2021 0142   BUN 21 10/12/2021 0142   BUN 23 03/29/2021 1158   CREATININE 0.98 10/12/2021 0142   CREATININE 0.99 09/28/2021 0000   CALCIUM 8.4 (L) 10/12/2021 0142   PROT 7.1 10/03/2021 1505   ALBUMIN 2.8 (L) 10/03/2021 1505   AST 26 10/03/2021 1505   ALT 20 10/03/2021 1505   ALKPHOS 56 10/03/2021 1505   BILITOT 1.0 10/03/2021 1505   GFRNONAA >60 10/12/2021 0142    GFR Estimated Creatinine Clearance: 96.6 mL/min (by C-G formula based on SCr of 0.98 mg/dL). No results for input(s): "LIPASE", "AMYLASE" in the last 72 hours. No results for input(s): "CKTOTAL", "CKMB", "CKMBINDEX", "TROPONINI" in the last 72 hours. Invalid input(s): "POCBNP" No results for input(s): "DDIMER" in the last 72 hours. No results for input(s): "HGBA1C" in the last 72 hours. No results for input(s): "CHOL", "HDL", "LDLCALC", "TRIG", "CHOLHDL", "LDLDIRECT" in the last 72 hours. No results for input(s): "TSH", "T4TOTAL", "T3FREE", "THYROIDAB" in the last 72 hours.  Invalid input(s): "FREET3" No results for input(s): "VITAMINB12", "FOLATE", "FERRITIN", "TIBC", "IRON", "RETICCTPCT" in the last 72 hours. Coags: No results for input(s): "INR" in the last 72 hours.  Invalid input(s):  "PT" Microbiology: Recent Results (from the past 240 hour(s))  Culture, blood (Routine x 2)     Status: None   Collection Time: 10/04/21  2:37 PM   Specimen: BLOOD LEFT HAND  Result Value Ref Range Status   Specimen Description BLOOD LEFT HAND  Final   Special Requests   Final    BOTTLES DRAWN AEROBIC AND ANAEROBIC Blood Culture adequate volume   Culture   Final    NO GROWTH 5 DAYS Performed at [facility], [address] [place], [place] [address]    Report Status 10/09/2021 FINAL  Final  Fungus Culture With Stain     Status: None (Preliminary result)   Collection Time: 10/06/21 12:42 PM   Specimen: PATH Cytology Bronchial lavage; Body Fluid  Result Value Ref Range Status   Fungus Stain Final report  Final    Comment: (NOTE) Performed At: [facility] [place], [place] [id number] Rush Farmer MD GY:[id number]    Fungus (Mycology) Culture PENDING  Incomplete   Fungal Source BRONCHIAL ALVEOLAR LAVAGE  Final    Comment: LINGULA Performed at [facility], [place] [address], [place], [place] [address]   Acid Fast Smear (AFB)     Status: None   Collection Time: 10/06/21 12:42 PM   Specimen: PATH Cytology Bronchial lavage; Body Fluid  Result Value Ref Range Status   AFB Specimen Processing Concentration  Final   Acid Fast Smear Negative  Final    Comment: (NOTE) Performed At: [facility] [place], [place] [id number] Rush Farmer MD JI:[id number]    Source (AFB) BRONCHIAL ALVEOLAR LAVAGE  Final    Comment: LINGULA Performed at [facility], [place] [address], [place], [place] [address]   Culture, BAL-quantitative w Gram Stain     Status: None   Collection Time: 10/06/21 12:42 PM   Specimen: Bronchoalveolar Lavage  Result Value Ref Range Status   Specimen Description BRONCHIAL ALVEOLAR LAVAGE  Final   Special Requests LINGULA  Final   Gram Stain   Final    RARE WBC PRESENT,BOTH PMN AND MONONUCLEAR NO ORGANISMS  SEEN    Culture   Final    NO GROWTH 2 DAYS Performed at [facility], [place] [address] [place], [place] [address]    Report Status 10/08/2021 FINAL  Final  Fungus Culture Result     Status: None   Collection Time: 10/06/21 12:42 PM  Result Value Ref Range Status   Result 1 Comment  Final    Comment: (NOTE) KOH/Calcofluor preparation:  no fungus observed. Performed At: [facility] [place], [place] [id number] Rush Farmer MD RW:[id number]     FURTHER DISCHARGE INSTRUCTIONS:  Get Medicines reviewed and adjusted: Please take all your medications with you for your next visit with your Primary MD  Laboratory/radiological data: Please request your Primary MD to go over all hospital tests and procedure/radiological results at the follow up, please ask your Primary MD to get all Hospital records sent to his/her office.  In some cases, they will be blood work, cultures and biopsy results pending at the time of your discharge. Please request that your primary care M.D. goes through all the records of your hospital data and follows up on these results.  Also Note the following: If you experience worsening of your admission symptoms, develop shortness of breath, life threatening emergency, suicidal or homicidal thoughts you must seek medical attention immediately by calling 911 or calling your MD immediately  if symptoms less severe.  You must read complete instructions/literature along with all the possible adverse reactions/side effects for all the Medicines you take and that have been prescribed to you. Take any new Medicines after you have completely understood and accpet all the possible adverse reactions/side effects.   Do not drive when taking Pain medications or sleeping medications (Benzodaizepines)  Do not take more than prescribed Pain, Sleep and Anxiety Medications. It is not advisable to combine anxiety,sleep and pain medications without talking  with your primary care practitioner  Special Instructions: If you have smoked or chewed Tobacco  in the last 2 yrs please stop smoking, stop any regular Alcohol  and or any Recreational drug use.  Wear Seat belts while driving.  Please note: You were cared for by a hospitalist during your hospital stay. Once you are discharged, your primary care physician will handle any further medical issues. Please note that NO REFILLS for any discharge medications will be authorized once you are discharged, as it is imperative that you return to your primary care physician (or establish a relationship with a primary care physician if you do not have one) for your post hospital discharge needs so that they can reassess your need for medications and monitor your lab values.  Total Time spent coordinating discharge including counseling, education and face to face time equals greater than 30 minutes.  SignedOren Binet 10/14/2021 9:16 AM

## 2021-10-14 NOTE — TOC Transition Note (Signed)
Transition of Care [facility]) - CM/SW Discharge Note   Patient Details  Name: Mark Thomas MRN: [id number] Date of Birth: 1950/05/13  [facility] CM/SW Contact:  Benard Halsted, LCSW Phone Number: 10/14/2021, 9:43 AM   Clinical Narrative:    Patient will DC to: [place] date: 10/14/21 Family notified: Spouse Transport by: Corey Harold 12:15pm   Per MD patient ready for DC to [facility]. RN to call report prior to discharge [phone] Room 604p). RN, patient, patient's family, and facility notified of DC. Discharge Summary and FL2 sent to facility. DC packet on chart. Ambulance transport requested for patient.   CSW will sign off for now as social work intervention is no longer needed. Please consult [place] again if new needs arise.     Final next level of care: Skilled Nursing Facility Barriers to Discharge: Barriers Resolved   Patient Goals and CMS Choice Patient states their goals for this hospitalization and ongoing recovery are:: Rehab CMS Medicare.gov Compare Post Acute Care list provided to:: Patient Choice offered to / list presented to : Patient, Spouse  Discharge Placement   Existing PASRR number confirmed : 10/14/21          Patient chooses bed at: [facility] Patient to be transferred to facility by: [place] Name of family member notified: Spouse Patient and family notified of of transfer: 10/14/21  Discharge Plan and Services In-house Referral: Clinical Social Work   Post Acute Care Choice: [place]                               Social Determinants of Health (SDOH) Interventions     Readmission Risk Interventions    10/12/2021    3:48 PM  Readmission Risk Prevention Plan  Transportation Screening Complete  Medication Review Press photographer) Complete  PCP or Specialist appointment within 3-5 days of discharge Complete  HRI or Home Care Consult Complete  SW Recovery Care/Counseling Consult Complete   Palliative Care Screening Not [facility] Complete

## 2021-10-19 ENCOUNTER — Inpatient Hospital Stay (HOSPITAL_COMMUNITY): Payer: Medicare Other

## 2021-10-19 ENCOUNTER — Encounter (HOSPITAL_COMMUNITY): Payer: Self-pay

## 2021-10-19 ENCOUNTER — Emergency Department (HOSPITAL_COMMUNITY): Payer: Medicare Other

## 2021-10-19 ENCOUNTER — Other Ambulatory Visit: Payer: Self-pay

## 2021-10-19 ENCOUNTER — Inpatient Hospital Stay (HOSPITAL_COMMUNITY)
Admission: EM | Admit: 2021-10-19 | Discharge: 2021-10-26 | DRG: 871 | Disposition: A | Discharge status: Skilled Nursing Facility | Payer: Medicare Other | Source: Skilled Nursing Facility | Attending: Family Medicine | Admitting: Family Medicine

## 2021-10-19 DIAGNOSIS — I248 Other forms of acute ischemic heart disease: Secondary | ICD-10-CM | POA: Diagnosis present

## 2021-10-19 DIAGNOSIS — F0154 Vascular dementia, unspecified severity, with anxiety: Secondary | ICD-10-CM | POA: Diagnosis present

## 2021-10-19 DIAGNOSIS — Z950 Presence of cardiac pacemaker: Secondary | ICD-10-CM | POA: Diagnosis present

## 2021-10-19 DIAGNOSIS — Z8249 Family history of ischemic heart disease and other diseases of the circulatory system: Secondary | ICD-10-CM

## 2021-10-19 DIAGNOSIS — Z8673 Personal history of transient ischemic attack (TIA), and cerebral infarction without residual deficits: Secondary | ICD-10-CM | POA: Diagnosis not present

## 2021-10-19 DIAGNOSIS — D7589 Other specified diseases of blood and blood-forming organs: Secondary | ICD-10-CM | POA: Diagnosis present

## 2021-10-19 DIAGNOSIS — F0152 Vascular dementia, unspecified severity, with psychotic disturbance: Secondary | ICD-10-CM | POA: Diagnosis present

## 2021-10-19 DIAGNOSIS — F323 Major depressive disorder, single episode, severe with psychotic features: Secondary | ICD-10-CM | POA: Diagnosis present

## 2021-10-19 DIAGNOSIS — Z7951 Long term (current) use of inhaled steroids: Secondary | ICD-10-CM

## 2021-10-19 DIAGNOSIS — I951 Orthostatic hypotension: Secondary | ICD-10-CM | POA: Diagnosis present

## 2021-10-19 DIAGNOSIS — D72829 Elevated white blood cell count, unspecified: Secondary | ICD-10-CM | POA: Diagnosis not present

## 2021-10-19 DIAGNOSIS — J9601 Acute respiratory failure with hypoxia: Secondary | ICD-10-CM | POA: Diagnosis present

## 2021-10-19 DIAGNOSIS — I2510 Atherosclerotic heart disease of native coronary artery without angina pectoris: Secondary | ICD-10-CM | POA: Diagnosis present

## 2021-10-19 DIAGNOSIS — F411 Generalized anxiety disorder: Secondary | ICD-10-CM | POA: Diagnosis present

## 2021-10-19 DIAGNOSIS — K219 Gastro-esophageal reflux disease without esophagitis: Secondary | ICD-10-CM | POA: Diagnosis not present

## 2021-10-19 DIAGNOSIS — J189 Pneumonia, unspecified organism: Secondary | ICD-10-CM | POA: Diagnosis not present

## 2021-10-19 DIAGNOSIS — T501X5A Adverse effect of loop [high-ceiling] diuretics, initial encounter: Secondary | ICD-10-CM | POA: Diagnosis present

## 2021-10-19 DIAGNOSIS — Z823 Family history of stroke: Secondary | ICD-10-CM

## 2021-10-19 DIAGNOSIS — Z8616 Personal history of COVID-19: Secondary | ICD-10-CM | POA: Diagnosis not present

## 2021-10-19 DIAGNOSIS — Z7902 Long term (current) use of antithrombotics/antiplatelets: Secondary | ICD-10-CM

## 2021-10-19 DIAGNOSIS — G2581 Restless legs syndrome: Secondary | ICD-10-CM | POA: Diagnosis present

## 2021-10-19 DIAGNOSIS — Y95 Nosocomial condition: Secondary | ICD-10-CM | POA: Diagnosis present

## 2021-10-19 DIAGNOSIS — J849 Interstitial pulmonary disease, unspecified: Secondary | ICD-10-CM | POA: Diagnosis present

## 2021-10-19 DIAGNOSIS — N183 Chronic kidney disease, stage 3 unspecified: Secondary | ICD-10-CM | POA: Diagnosis present

## 2021-10-19 DIAGNOSIS — F0150 Vascular dementia without behavioral disturbance: Secondary | ICD-10-CM | POA: Diagnosis present

## 2021-10-19 DIAGNOSIS — Z6834 Body mass index (BMI) 34.0-34.9, adult: Secondary | ICD-10-CM

## 2021-10-19 DIAGNOSIS — Z79899 Other long term (current) drug therapy: Secondary | ICD-10-CM

## 2021-10-19 DIAGNOSIS — E8809 Other disorders of plasma-protein metabolism, not elsewhere classified: Secondary | ICD-10-CM | POA: Diagnosis present

## 2021-10-19 DIAGNOSIS — E7800 Pure hypercholesterolemia, unspecified: Secondary | ICD-10-CM | POA: Diagnosis present

## 2021-10-19 DIAGNOSIS — G250 Essential tremor: Secondary | ICD-10-CM | POA: Diagnosis present

## 2021-10-19 DIAGNOSIS — I495 Sick sinus syndrome: Secondary | ICD-10-CM | POA: Diagnosis present

## 2021-10-19 DIAGNOSIS — I5033 Acute on chronic diastolic (congestive) heart failure: Secondary | ICD-10-CM | POA: Diagnosis present

## 2021-10-19 DIAGNOSIS — J90 Pleural effusion, not elsewhere classified: Secondary | ICD-10-CM | POA: Diagnosis not present

## 2021-10-19 DIAGNOSIS — F0153 Vascular dementia, unspecified severity, with mood disturbance: Secondary | ICD-10-CM | POA: Diagnosis present

## 2021-10-19 DIAGNOSIS — N179 Acute kidney failure, unspecified: Secondary | ICD-10-CM | POA: Diagnosis present

## 2021-10-19 DIAGNOSIS — A419 Sepsis, unspecified organism: Secondary | ICD-10-CM | POA: Diagnosis present

## 2021-10-19 DIAGNOSIS — F332 Major depressive disorder, recurrent severe without psychotic features: Secondary | ICD-10-CM | POA: Diagnosis not present

## 2021-10-19 DIAGNOSIS — E669 Obesity, unspecified: Secondary | ICD-10-CM | POA: Diagnosis present

## 2021-10-19 DIAGNOSIS — I480 Paroxysmal atrial fibrillation: Secondary | ICD-10-CM | POA: Diagnosis present

## 2021-10-19 DIAGNOSIS — J9621 Acute and chronic respiratory failure with hypoxia: Secondary | ICD-10-CM | POA: Diagnosis present

## 2021-10-19 DIAGNOSIS — R45851 Suicidal ideations: Secondary | ICD-10-CM | POA: Diagnosis present

## 2021-10-19 DIAGNOSIS — I130 Hypertensive heart and chronic kidney disease with heart failure and stage 1 through stage 4 chronic kidney disease, or unspecified chronic kidney disease: Secondary | ICD-10-CM | POA: Diagnosis present

## 2021-10-19 DIAGNOSIS — T380X5A Adverse effect of glucocorticoids and synthetic analogues, initial encounter: Secondary | ICD-10-CM | POA: Diagnosis present

## 2021-10-19 DIAGNOSIS — D649 Anemia, unspecified: Secondary | ICD-10-CM | POA: Diagnosis not present

## 2021-10-19 DIAGNOSIS — K5900 Constipation, unspecified: Secondary | ICD-10-CM | POA: Diagnosis present

## 2021-10-19 DIAGNOSIS — F333 Major depressive disorder, recurrent, severe with psychotic symptoms: Secondary | ICD-10-CM | POA: Diagnosis present

## 2021-10-19 DIAGNOSIS — D539 Nutritional anemia, unspecified: Secondary | ICD-10-CM | POA: Diagnosis present

## 2021-10-19 DIAGNOSIS — Z955 Presence of coronary angioplasty implant and graft: Secondary | ICD-10-CM

## 2021-10-19 DIAGNOSIS — J9811 Atelectasis: Secondary | ICD-10-CM | POA: Diagnosis present

## 2021-10-19 DIAGNOSIS — Z8701 Personal history of pneumonia (recurrent): Secondary | ICD-10-CM

## 2021-10-19 DIAGNOSIS — I252 Old myocardial infarction: Secondary | ICD-10-CM

## 2021-10-19 DIAGNOSIS — Z7982 Long term (current) use of aspirin: Secondary | ICD-10-CM

## 2021-10-19 LAB — CBC WITH DIFFERENTIAL/PLATELET
Abs Immature Granulocytes: 0.16 10*3/uL — ABNORMAL HIGH (ref 0.00–0.07)
Basophils Absolute: 0 10*3/uL (ref 0.0–0.1)
Basophils Relative: 0 %
Eosinophils Absolute: 0.1 10*3/uL (ref 0.0–0.5)
Eosinophils Relative: 1 %
HCT: 34.8 % — ABNORMAL LOW (ref 39.0–52.0)
Hemoglobin: 11 g/dL — ABNORMAL LOW (ref 13.0–17.0)
Immature Granulocytes: 1 %
Lymphocytes Relative: 8 %
Lymphs Abs: 1.2 10*3/uL (ref 0.7–4.0)
MCH: 32.2 pg (ref 26.0–34.0)
MCHC: 31.6 g/dL (ref 30.0–36.0)
MCV: 101.8 fL — ABNORMAL HIGH (ref 80.0–100.0)
Monocytes Absolute: 1 10*3/uL (ref 0.1–1.0)
Monocytes Relative: 7 %
Neutro Abs: 13.4 10*3/uL — ABNORMAL HIGH (ref 1.7–7.7)
Neutrophils Relative %: 83 %
Platelets: 204 10*3/uL (ref 150–400)
RBC: 3.42 MIL/uL — ABNORMAL LOW (ref 4.22–5.81)
RDW: 15.4 % (ref 11.5–15.5)
WBC: 15.9 10*3/uL — ABNORMAL HIGH (ref 4.0–10.5)
nRBC: 0 % (ref 0.0–0.2)

## 2021-10-19 LAB — COMPREHENSIVE METABOLIC PANEL
ALT: 41 U/L (ref 0–44)
AST: 36 U/L (ref 15–41)
Albumin: 2.8 g/dL — ABNORMAL LOW (ref 3.5–5.0)
Alkaline Phosphatase: 76 U/L (ref 38–126)
Anion gap: 11 (ref 5–15)
BUN: 19 mg/dL (ref 8–23)
CO2: 29 mmol/L (ref 22–32)
Calcium: 9.4 mg/dL (ref 8.9–10.3)
Chloride: 100 mmol/L (ref 98–111)
Creatinine, Ser: 1.08 mg/dL (ref 0.61–1.24)
GFR, Estimated: >60 mL/min (ref >60)
Glucose, Bld: 119 mg/dL — ABNORMAL HIGH (ref 70–99)
Potassium: 3.9 mmol/L (ref 3.5–5.1)
Sodium: 140 mmol/L (ref 135–145)
Total Bilirubin: 0.8 mg/dL (ref 0.3–1.2)
Total Protein: 7.1 g/dL (ref 6.5–8.1)

## 2021-10-19 LAB — I-STAT VENOUS BLOOD GAS, ED
Acid-Base Excess: 4 mmol/L — ABNORMAL HIGH (ref 0.0–2.0)
Bicarbonate: 31.4 mmol/L — ABNORMAL HIGH (ref 20.0–28.0)
Calcium, Ion: 1.23 mmol/L (ref 1.15–1.40)
HCT: 34 % — ABNORMAL LOW (ref 39.0–52.0)
Hemoglobin: 11.6 g/dL — ABNORMAL LOW (ref 13.0–17.0)
O2 Saturation: 82 %
Potassium: 3.9 mmol/L (ref 3.5–5.1)
Sodium: 139 mmol/L (ref 135–145)
TCO2: 33 mmol/L — ABNORMAL HIGH (ref 22–32)
pCO2, Ven: 56.8 mmHg (ref 44–60)
pH, Ven: 7.35 (ref 7.25–7.43)
pO2, Ven: 50 mmHg — ABNORMAL HIGH (ref 32–45)

## 2021-10-19 LAB — RESPIRATORY PANEL BY PCR

## 2021-10-19 LAB — LACTIC ACID, PLASMA: Lactic Acid, Venous: 1.3 mmol/L (ref 0.5–1.9)

## 2021-10-19 LAB — STREP PNEUMONIAE URINARY ANTIGEN: Strep Pneumo Urinary Antigen: NEGATIVE

## 2021-10-19 LAB — BRAIN NATRIURETIC PEPTIDE: B Natriuretic Peptide: 165.5 pg/mL — ABNORMAL HIGH (ref 0.0–100.0)

## 2021-10-19 LAB — TROPONIN I (HIGH SENSITIVITY): Troponin I (High Sensitivity): 58 ng/L — ABNORMAL HIGH (ref <18)

## 2021-10-19 IMAGING — DX DG ABDOMEN 1V
1 series · 3 of 3 positions shown · non-contrast
Comparison: None Available.

CLINICAL DATA: [TV]. Encounter for Abdominal distension, shortness
of breath

EXAM:
ABDOMEN - 1 VIEW

[Series 1: abdomen · 0.14mm/px · 3 of 3 slices shown]
[im 1/3]
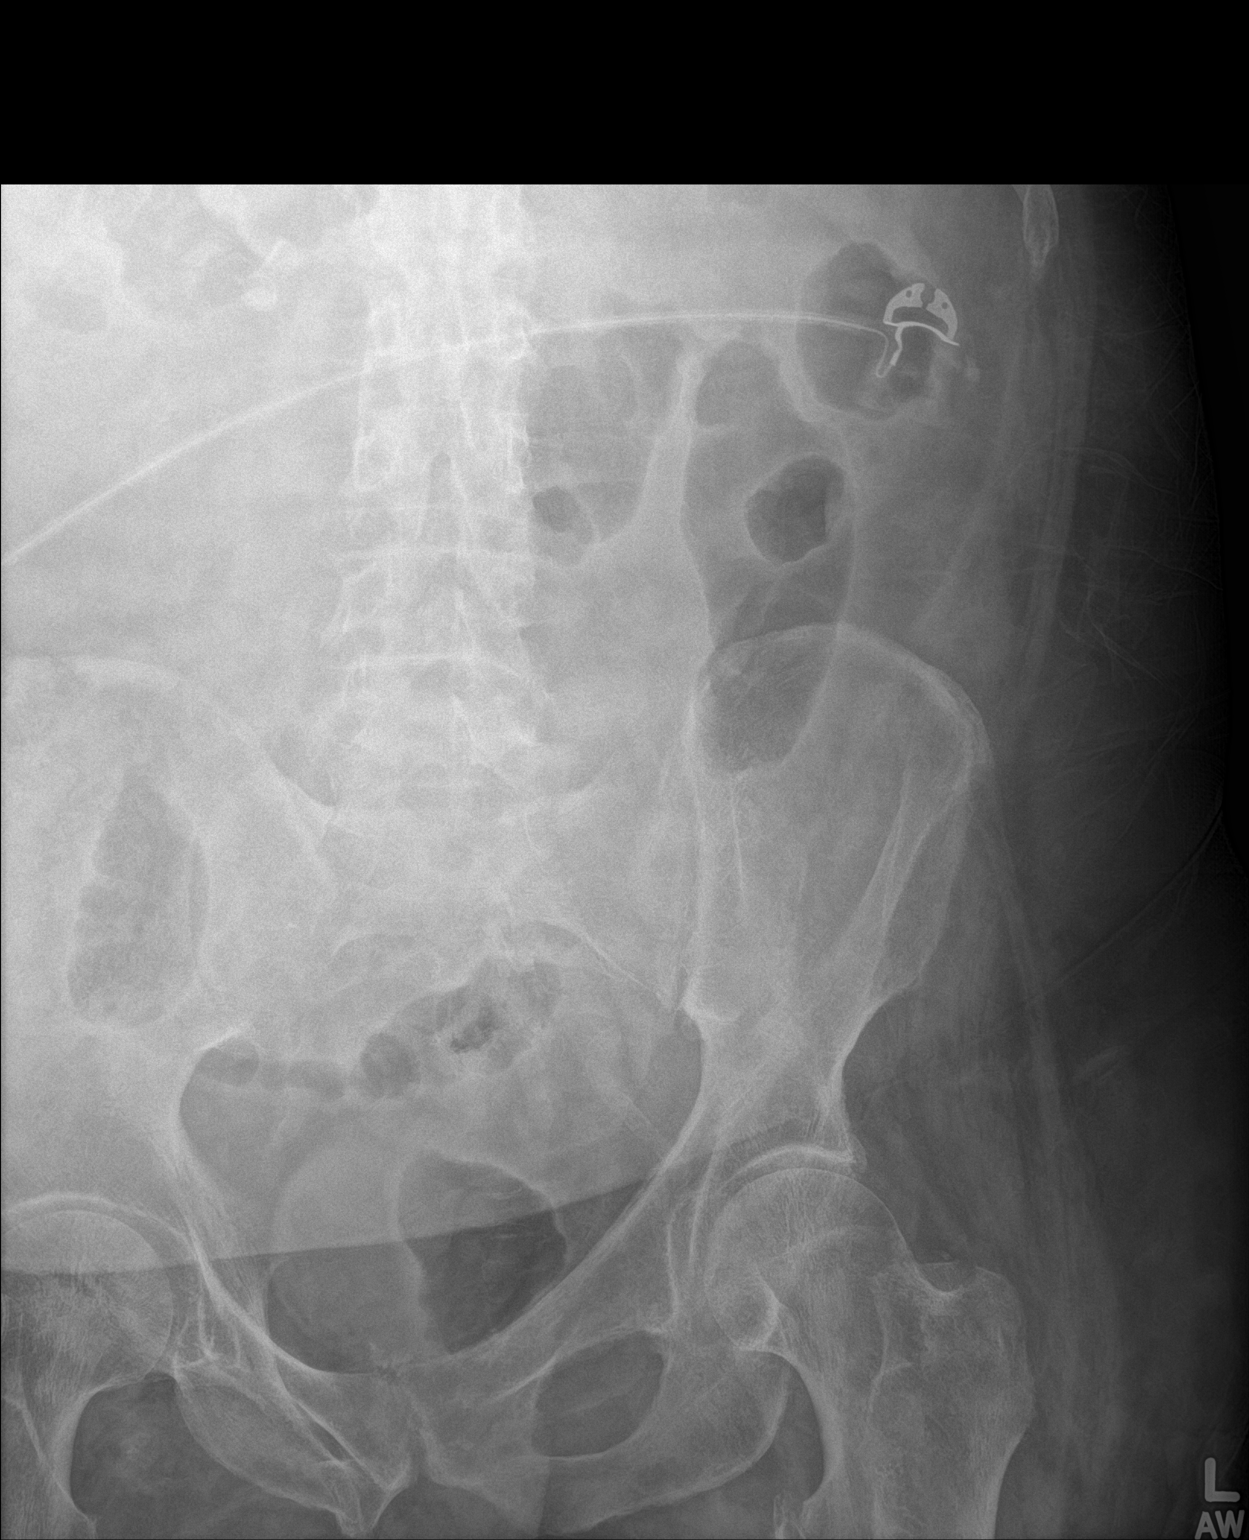
[im 2/3]
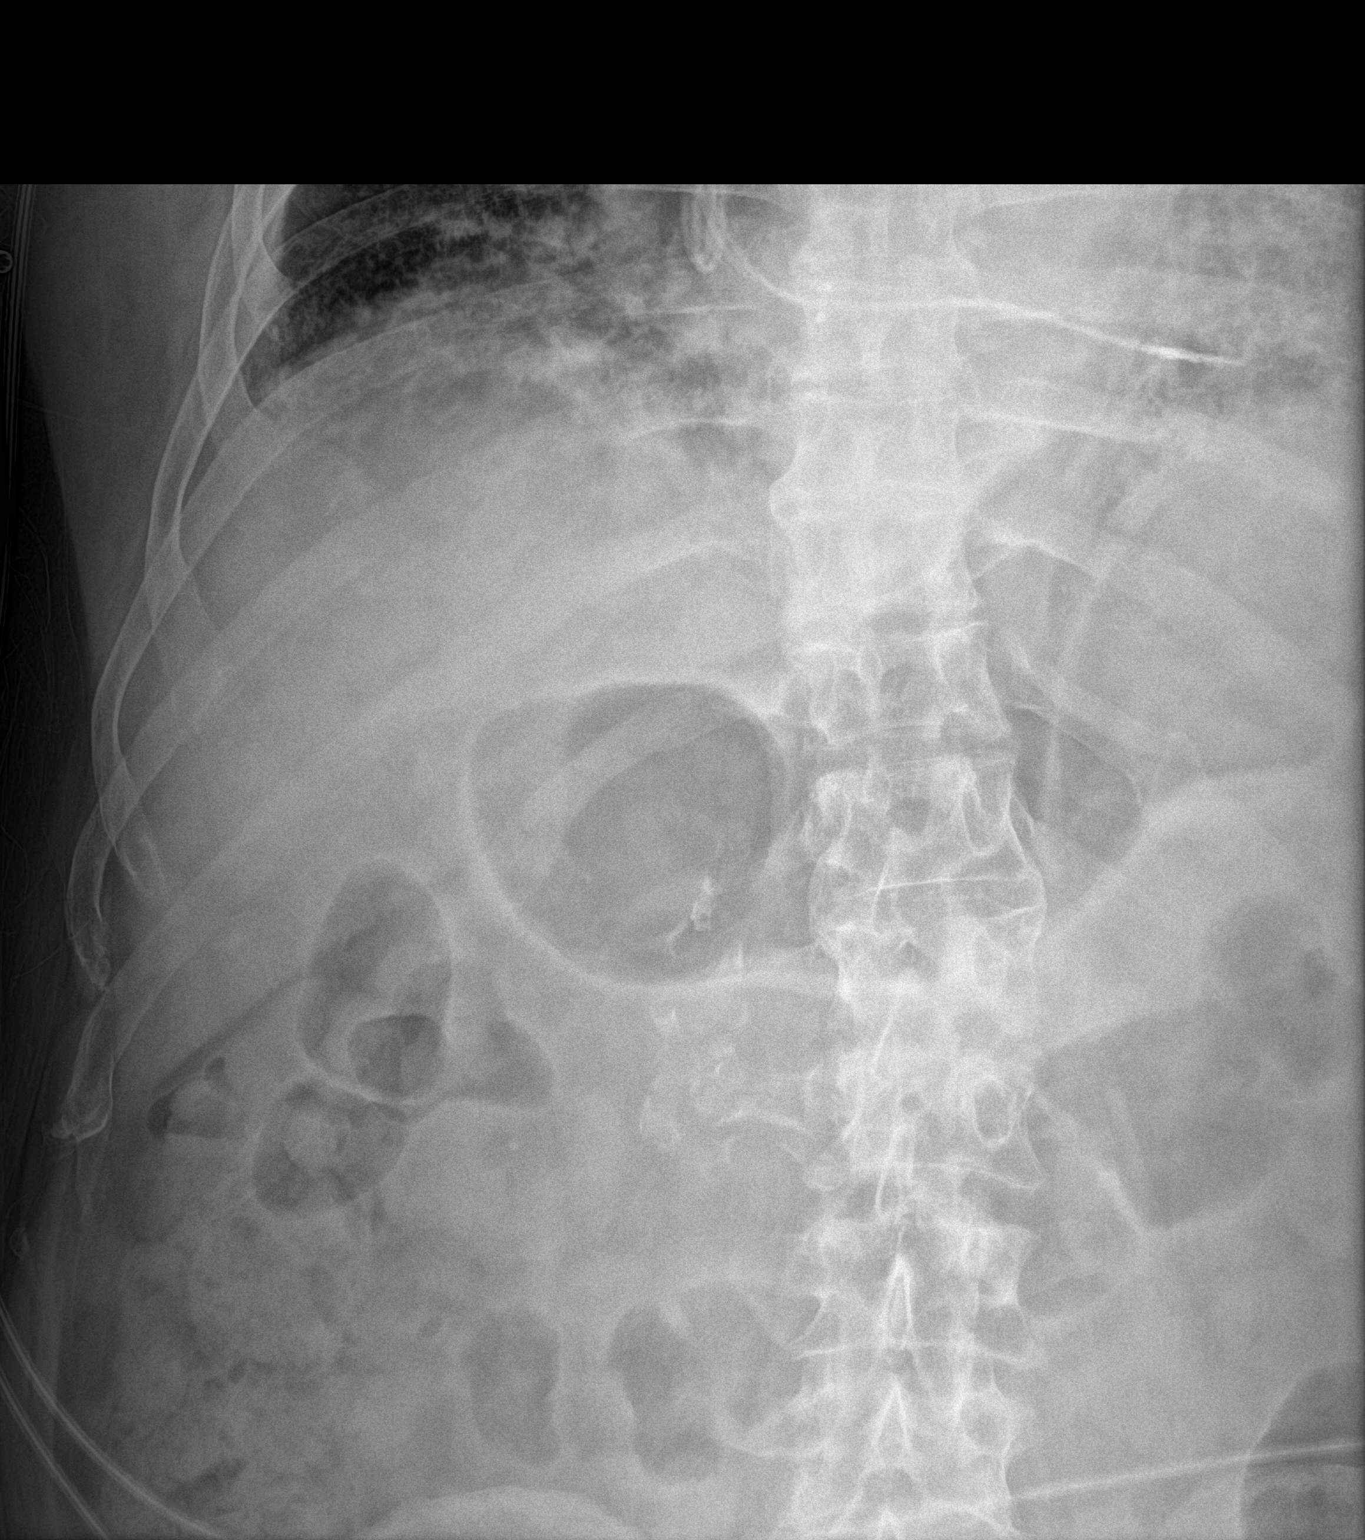
[im 3/3]
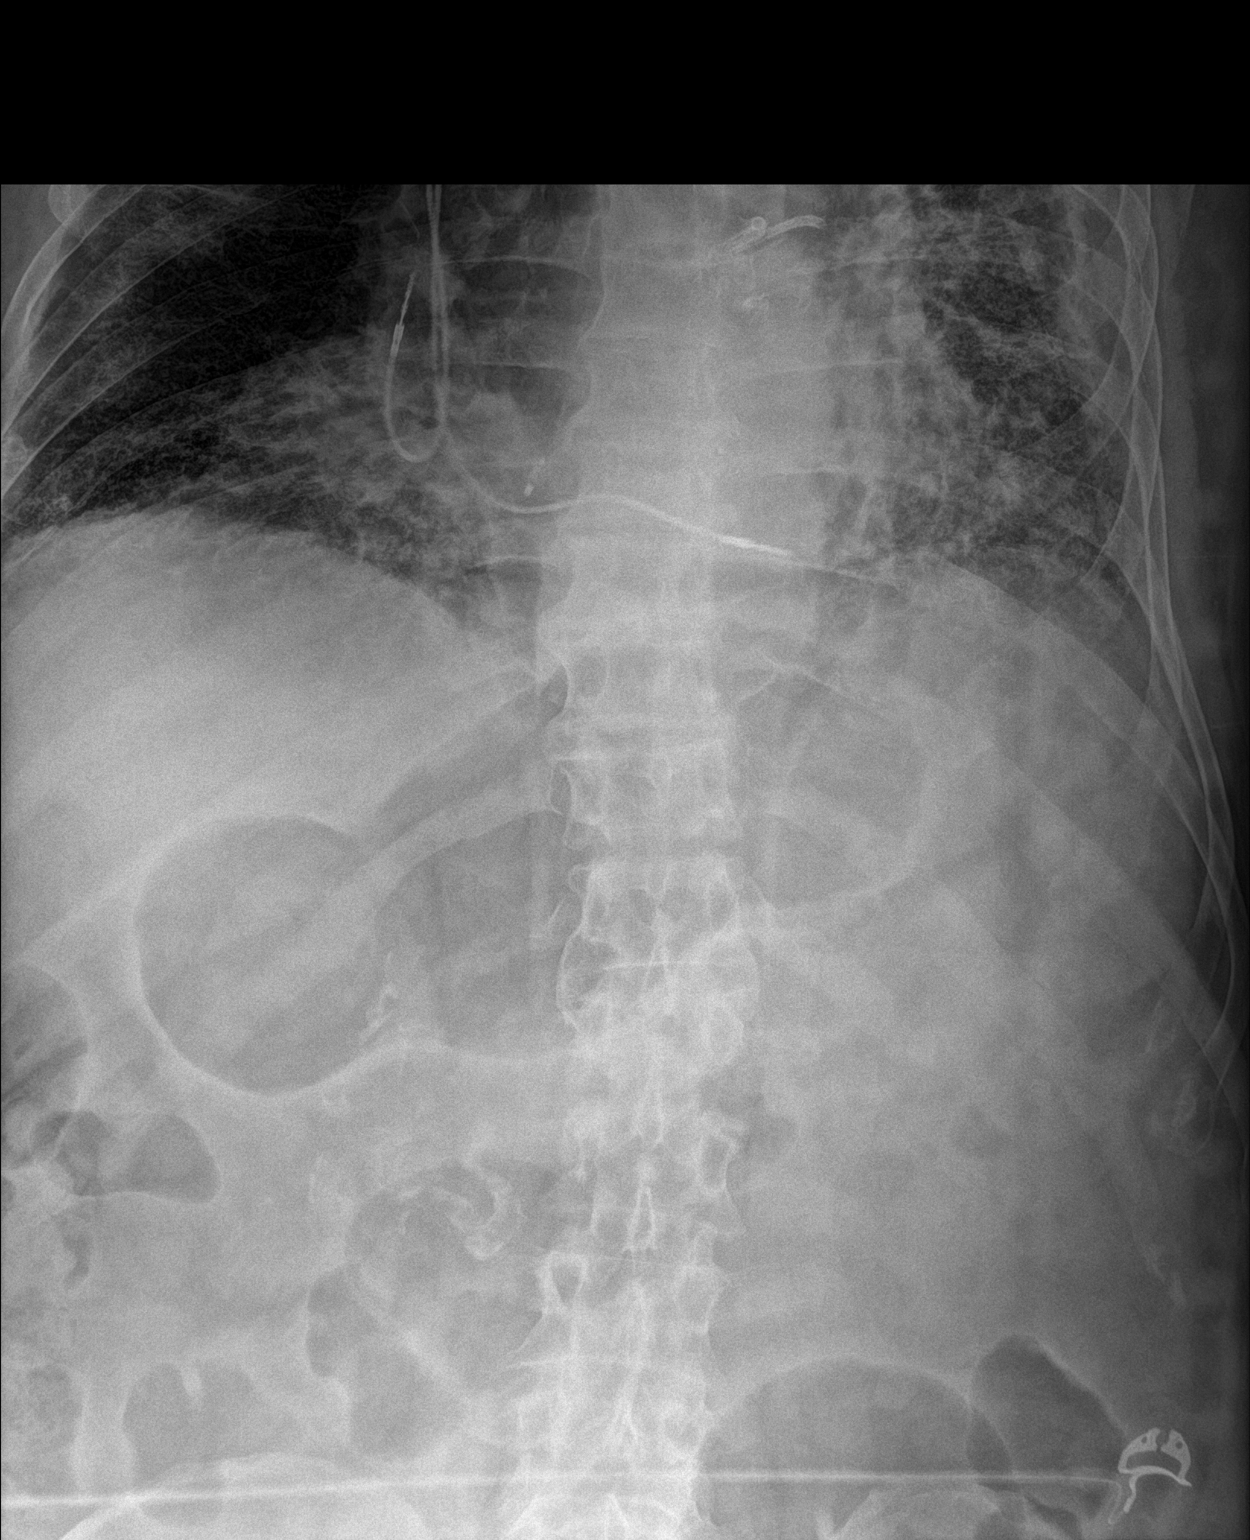

[3 of 3 positions shown; findings below may reference images not displayed]

FINDINGS: Atherosclerotic plaque. The bowel gas pattern is normal. No
radio-opaque calculi or other significant radiographic abnormality
are seen.

Two lead cardiac pacemaker partially visualized. Coronary artery
stents.
IMPRESSION: 1. Nonobstructive bowel gas pattern.
2. Aortic Atherosclerosis ([TV]-[TV]).
3. Please see separately dictated chest x-ray [DATE].

## 2021-10-19 IMAGING — DX DG CHEST 2V
3 series · 3 of 3 positions shown · non-contrast
Comparison: Radiograph [DATE]

CLINICAL DATA: PNA, worsening SOB

EXAM:
CHEST - 2 VIEW

[chest lat (1 of 2)]
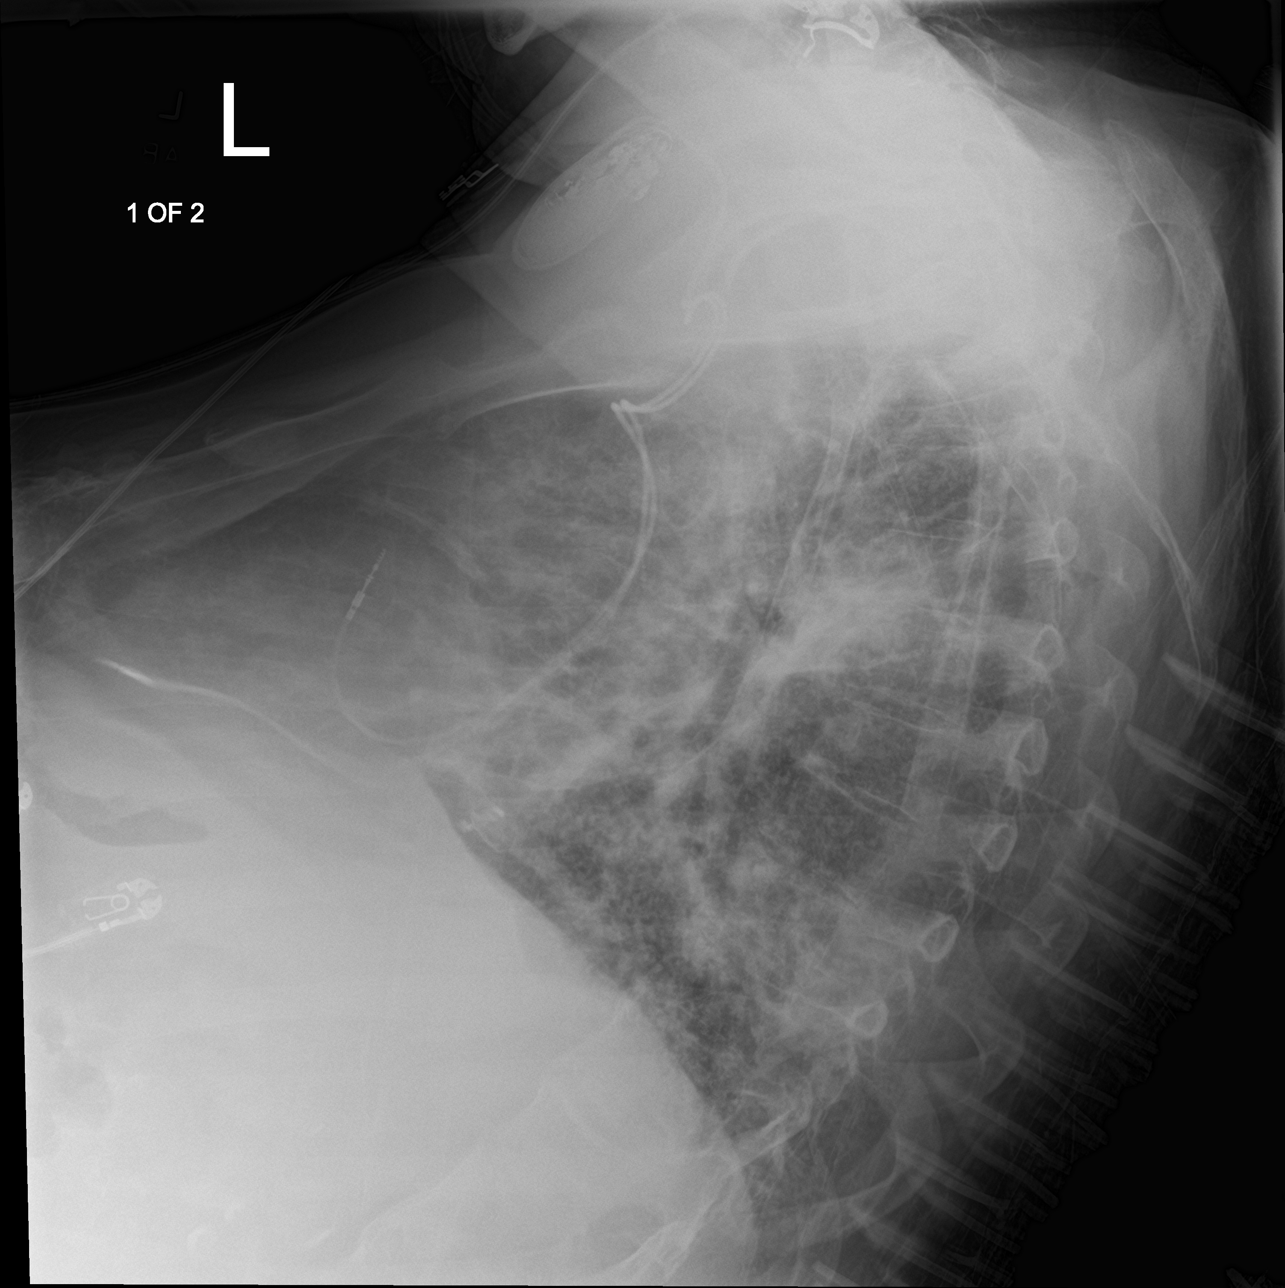

[chest ap]
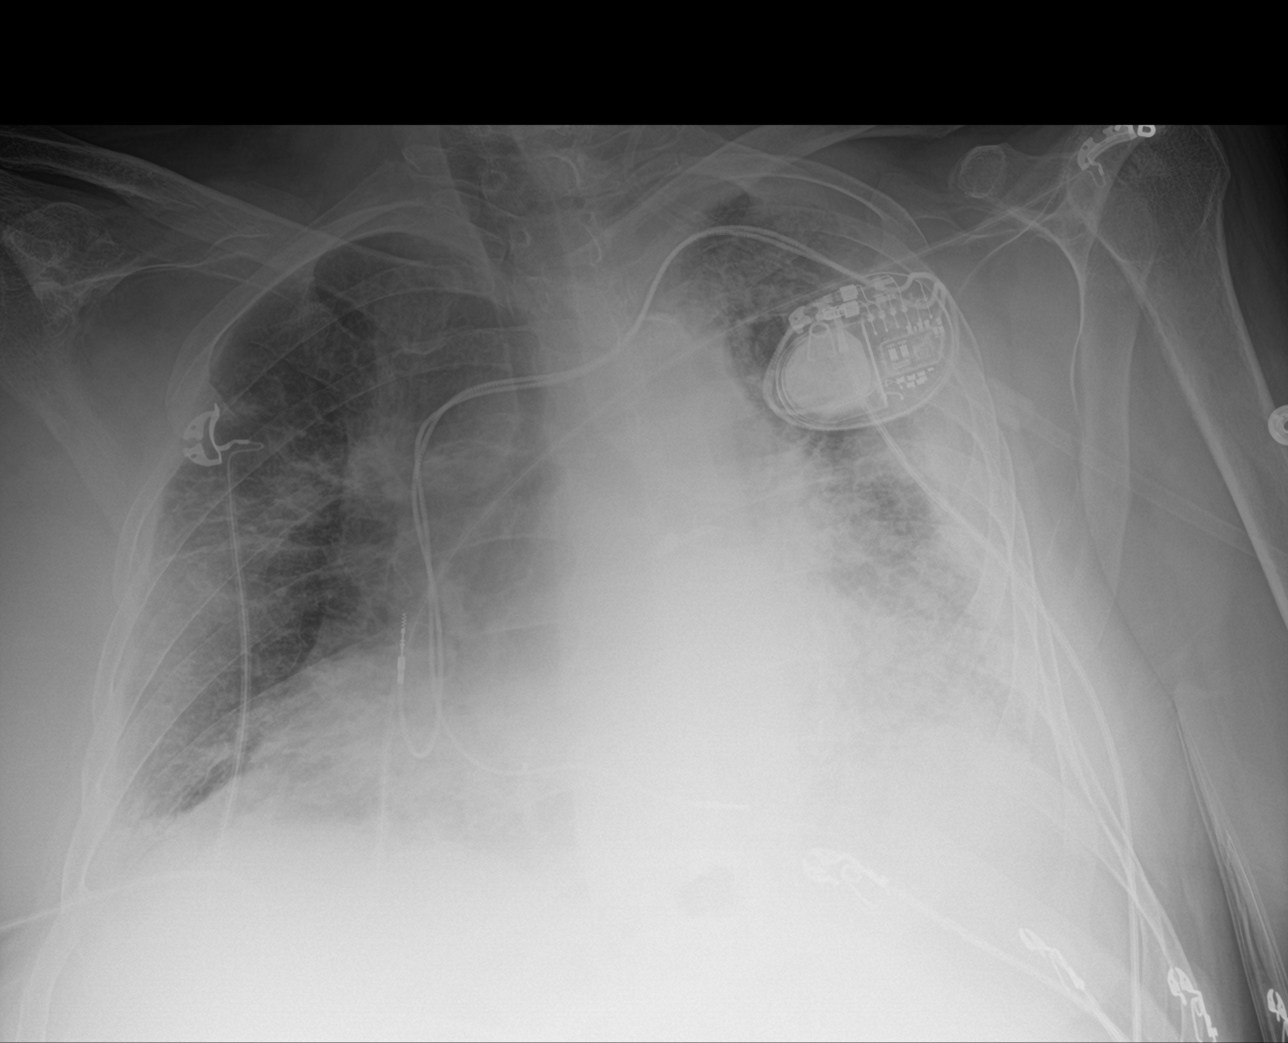

[chest lat (2 of 2)]
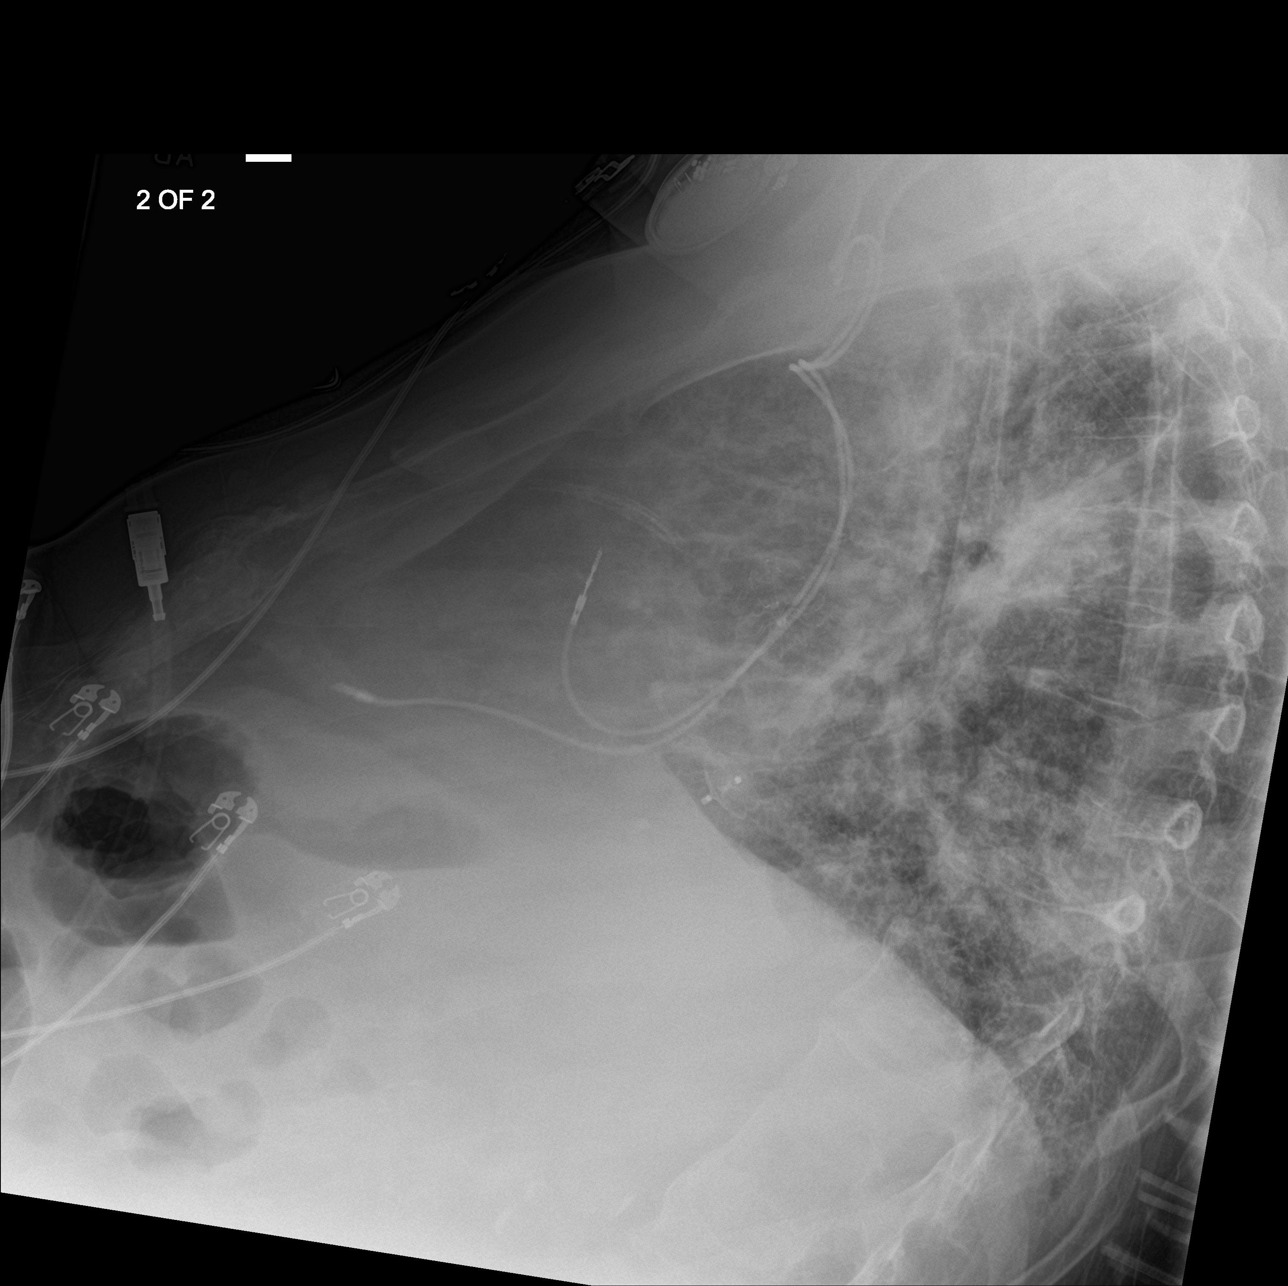

[3 of 3 positions shown; findings below may reference images not displayed]

FINDINGS: Unchanged dual chamber pacemaker leads. Unchanged cardiomediastinal
silhouette. Persistent bilateral interstitial and airspace disease,
worsening in the right mid and medial basilar lung. Stable small
bilateral pleural effusions. No pneumothorax. No acute osseous
abnormality.
IMPRESSION: Persistent multifocal pneumonia with increasing airspace disease in
the right mid and medial basilar lung. Stable bilateral pleural
effusions.

## 2021-10-19 MED ORDER — ASPIRIN 81 MG PO TBEC
81.0000 mg | DELAYED_RELEASE_TABLET | Freq: Every day | ORAL | Status: DC
  Administered 2021-10-19 – 2021-10-26 (×8): 81 mg via ORAL
  Filled 2021-10-19 (×8): qty 1

## 2021-10-19 MED ORDER — OXYCODONE-ACETAMINOPHEN 5-325 MG PO TABS
1.0000 | ORAL_TABLET | Freq: Four times a day (QID) | ORAL | Status: DC | PRN
  Administered 2021-10-19 – 2021-10-20 (×4): 1 via ORAL
  Filled 2021-10-19 (×4): qty 1

## 2021-10-19 MED ORDER — SODIUM CHLORIDE 0.9 % IV SOLN
2.0000 g | Freq: Once | INTRAVENOUS | Status: AC
  Administered 2021-10-19: 2 g via INTRAVENOUS
  Filled 2021-10-19: qty 12.5

## 2021-10-19 MED ORDER — ATORVASTATIN CALCIUM 80 MG PO TABS
80.0000 mg | ORAL_TABLET | Freq: Every day | ORAL | Status: DC
  Administered 2021-10-19 – 2021-10-26 (×8): 80 mg via ORAL
  Filled 2021-10-19 (×9): qty 1

## 2021-10-19 MED ORDER — SODIUM CHLORIDE 0.9% FLUSH
3.0000 mL | Freq: Two times a day (BID) | INTRAVENOUS | Status: DC
  Administered 2021-10-19 – 2021-10-26 (×13): 3 mL via INTRAVENOUS

## 2021-10-19 MED ORDER — GUAIFENESIN ER 600 MG PO TB12
600.0000 mg | ORAL_TABLET | Freq: Two times a day (BID) | ORAL | Status: DC
  Administered 2021-10-19 – 2021-10-26 (×14): 600 mg via ORAL
  Filled 2021-10-19 (×14): qty 1

## 2021-10-19 MED ORDER — ENOXAPARIN SODIUM 60 MG/0.6ML IJ SOSY
55.0000 mg | PREFILLED_SYRINGE | INTRAMUSCULAR | Status: DC
  Administered 2021-10-19 – 2021-10-25 (×7): 55 mg via SUBCUTANEOUS
  Filled 2021-10-19 (×2): qty 0.6
  Filled 2021-10-19: qty 0.55
  Filled 2021-10-19 (×4): qty 0.6

## 2021-10-19 MED ORDER — PANTOPRAZOLE SODIUM 40 MG PO TBEC
40.0000 mg | DELAYED_RELEASE_TABLET | Freq: Every evening | ORAL | Status: DC
  Administered 2021-10-19 – 2021-10-25 (×7): 40 mg via ORAL
  Filled 2021-10-19 (×7): qty 1

## 2021-10-19 MED ORDER — VANCOMYCIN HCL 10 G IV SOLR
2250.0000 mg | Freq: Once | INTRAVENOUS | Status: AC
  Administered 2021-10-19: 2250 mg via INTRAVENOUS
  Filled 2021-10-19: qty 2250

## 2021-10-19 MED ORDER — ALBUTEROL SULFATE (2.5 MG/3ML) 0.083% IN NEBU: 2.5000 mg | INHALATION_SOLUTION | RESPIRATORY_TRACT | Status: DC | PRN

## 2021-10-19 MED ORDER — ACETAMINOPHEN 325 MG PO TABS
650.0000 mg | ORAL_TABLET | Freq: Four times a day (QID) | ORAL | Status: DC | PRN
  Administered 2021-10-20 – 2021-10-26 (×5): 650 mg via ORAL
  Filled 2021-10-19 (×6): qty 2

## 2021-10-19 MED ORDER — PRIMIDONE 50 MG PO TABS
100.0000 mg | ORAL_TABLET | Freq: Two times a day (BID) | ORAL | Status: DC
  Administered 2021-10-19 – 2021-10-26 (×14): 100 mg via ORAL
  Filled 2021-10-19 (×15): qty 2

## 2021-10-19 MED ORDER — CLOPIDOGREL BISULFATE 75 MG PO TABS
75.0000 mg | ORAL_TABLET | Freq: Every day | ORAL | Status: DC
  Administered 2021-10-19 – 2021-10-26 (×8): 75 mg via ORAL
  Filled 2021-10-19 (×8): qty 1

## 2021-10-19 MED ORDER — METOPROLOL TARTRATE 12.5 MG HALF TABLET
37.5000 mg | ORAL_TABLET | Freq: Two times a day (BID) | ORAL | Status: DC
  Administered 2021-10-19 – 2021-10-26 (×12): 37.5 mg via ORAL
  Filled 2021-10-19 (×4): qty 1
  Filled 2021-10-19: qty 2
  Filled 2021-10-19 (×8): qty 1

## 2021-10-19 MED ORDER — RANOLAZINE ER 500 MG PO TB12
1000.0000 mg | ORAL_TABLET | Freq: Two times a day (BID) | ORAL | Status: DC
  Administered 2021-10-19 – 2021-10-26 (×14): 1000 mg via ORAL
  Filled 2021-10-19 (×14): qty 2

## 2021-10-19 MED ORDER — ARFORMOTEROL TARTRATE 15 MCG/2ML IN NEBU
15.0000 ug | INHALATION_SOLUTION | Freq: Two times a day (BID) | RESPIRATORY_TRACT | Status: DC
  Administered 2021-10-19 – 2021-10-26 (×14): 15 ug via RESPIRATORY_TRACT
  Filled 2021-10-19 (×14): qty 2

## 2021-10-19 MED ORDER — BUDESONIDE 0.25 MG/2ML IN SUSP
0.2500 mg | Freq: Two times a day (BID) | RESPIRATORY_TRACT | Status: DC
  Administered 2021-10-19 – 2021-10-26 (×14): 0.25 mg via RESPIRATORY_TRACT
  Filled 2021-10-19 (×14): qty 2

## 2021-10-19 MED ORDER — ROPINIROLE HCL ER 4 MG PO TB24
4.0000 mg | ORAL_TABLET | Freq: Every day | ORAL | Status: DC
  Administered 2021-10-19 – 2021-10-25 (×7): 4 mg via ORAL
  Filled 2021-10-19 (×8): qty 1

## 2021-10-19 MED ORDER — MEMANTINE HCL 10 MG PO TABS
10.0000 mg | ORAL_TABLET | Freq: Two times a day (BID) | ORAL | Status: DC
  Administered 2021-10-19 – 2021-10-26 (×14): 10 mg via ORAL
  Filled 2021-10-19 (×15): qty 1

## 2021-10-19 MED ORDER — SODIUM CHLORIDE 0.9 % IV SOLN
100.0000 mg | INTRAVENOUS | Status: DC
  Filled 2021-10-19: qty 5

## 2021-10-19 MED ORDER — FUROSEMIDE 10 MG/ML IJ SOLN
40.0000 mg | Freq: Every day | INTRAMUSCULAR | Status: DC
  Administered 2021-10-19 – 2021-10-20 (×2): 40 mg via INTRAVENOUS
  Filled 2021-10-19 (×2): qty 4

## 2021-10-19 MED ORDER — ESCITALOPRAM OXALATE 10 MG PO TABS
30.0000 mg | ORAL_TABLET | Freq: Every day | ORAL | Status: DC
  Administered 2021-10-19 – 2021-10-26 (×8): 30 mg via ORAL
  Filled 2021-10-19 (×9): qty 3

## 2021-10-19 MED ORDER — POLYETHYLENE GLYCOL 3350 17 G PO PACK
17.0000 g | PACK | Freq: Two times a day (BID) | ORAL | Status: DC
  Administered 2021-10-20 – 2021-10-26 (×8): 17 g via ORAL
  Filled 2021-10-19 (×13): qty 1

## 2021-10-19 MED ORDER — IPRATROPIUM-ALBUTEROL 0.5-2.5 (3) MG/3ML IN SOLN
3.0000 mL | Freq: Three times a day (TID) | RESPIRATORY_TRACT | Status: DC
Start: 2021-10-19 — End: 2021-10-19

## 2021-10-19 MED ORDER — VANCOMYCIN HCL IN DEXTROSE 1-5 GM/200ML-% IV SOLN
1000.0000 mg | Freq: Two times a day (BID) | INTRAVENOUS | Status: DC
  Administered 2021-10-20 – 2021-10-22 (×5): 1000 mg via INTRAVENOUS
  Filled 2021-10-19 (×6): qty 200

## 2021-10-19 MED ORDER — IPRATROPIUM-ALBUTEROL 0.5-2.5 (3) MG/3ML IN SOLN
3.0000 mL | Freq: Three times a day (TID) | RESPIRATORY_TRACT | Status: DC
  Administered 2021-10-19 – 2021-10-20 (×2): 3 mL via RESPIRATORY_TRACT
  Filled 2021-10-19 (×2): qty 3

## 2021-10-19 MED ORDER — MOMETASONE FURO-FORMOTEROL FUM 200-5 MCG/ACT IN AERO: 2.0000 | INHALATION_SPRAY | Freq: Two times a day (BID) | RESPIRATORY_TRACT | Status: DC

## 2021-10-19 MED ORDER — MIDODRINE HCL 5 MG PO TABS
2.5000 mg | ORAL_TABLET | Freq: Three times a day (TID) | ORAL | Status: DC
  Administered 2021-10-20: 2.5 mg via ORAL
  Filled 2021-10-19: qty 1

## 2021-10-19 MED ORDER — QUETIAPINE FUMARATE 100 MG PO TABS
100.0000 mg | ORAL_TABLET | Freq: Every day | ORAL | Status: DC
  Administered 2021-10-19 – 2021-10-25 (×7): 100 mg via ORAL
  Filled 2021-10-19 (×2): qty 1
  Filled 2021-10-19: qty 4
  Filled 2021-10-19 (×4): qty 1

## 2021-10-19 MED ORDER — MAGNESIUM OXIDE -MG SUPPLEMENT 400 (240 MG) MG PO TABS
400.0000 mg | ORAL_TABLET | Freq: Every day | ORAL | Status: DC
  Administered 2021-10-19 – 2021-10-26 (×8): 400 mg via ORAL
  Filled 2021-10-19 (×8): qty 1

## 2021-10-19 MED ORDER — PREDNISONE 20 MG PO TABS
40.0000 mg | ORAL_TABLET | Freq: Every day | ORAL | Status: DC
  Administered 2021-10-20 – 2021-10-25 (×6): 40 mg via ORAL
  Filled 2021-10-19 (×6): qty 2

## 2021-10-19 MED ORDER — ACETAMINOPHEN 650 MG RE SUPP: 650.0000 mg | Freq: Four times a day (QID) | RECTAL | Status: DC | PRN

## 2021-10-19 MED ORDER — ALLOPURINOL 100 MG PO TABS
100.0000 mg | ORAL_TABLET | Freq: Every day | ORAL | Status: DC
  Administered 2021-10-19 – 2021-10-26 (×8): 100 mg via ORAL
  Filled 2021-10-19 (×8): qty 1

## 2021-10-19 NOTE — ED Provider Notes (Addendum)
[facility] EMERGENCY DEPARTMENT Provider Note   CSN: [id number] Arrival date & time: 10/19/21  1015     History  Chief Complaint  Patient presents with   Shortness of [place] is a 71 y.o. male.  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  He feels that his pneumonia is gradually getting worse since leaving the hospital.  He denies fevers.  He was discharged to rehab on oxygen therapy, 3 L at rest/5 L with activity, which is unchanged.  He feels that he has fluid overload stating that his stomach feels swollen.  He does not typically swell in his legs.  No significant orthostasis.  Patient states that the decision to come to the emergency department today was his choice.         Home Medications Prior to Admission medications   Medication Sig Start Date End Date Taking? Authorizing Provider  albuterol (PROVENTIL) (2.5 MG/3ML) 0.083% nebulizer solution Take 3 mLs (2.5 mg total) by nebulization every 6 (six) hours as needed for wheezing or shortness of breath. Patient taking differently: Take 2.5 mg by nebulization every 4 (four) hours as needed for shortness of breath. 07/15/21   Luetta Nutting, DO  albuterol (VENTOLIN HFA) 108 (90 Base) MCG/ACT inhaler Inhale 2 puffs into the lungs every 6 (six) hours as needed for wheezing or shortness of breath.    [provider]  allopurinol (ZYLOPRIM) 100 MG tablet Take 100 mg by mouth daily. 09/22/20   [provider]  aspirin 81 MG EC tablet Take 81 mg by mouth daily. 05/05/14   [provider]   atorvastatin (LIPITOR) 80 MG tablet Take 80 mg by mouth daily. 11/18/20   [provider]  b complex vitamins capsule Take 1 capsule by mouth daily.    [provider]  benzonatate (TESSALON) 100 MG capsule Take 1 capsule (100 mg total) by mouth 3 (three) times daily as needed for cough. 10/14/21   Ghimire, Henreitta Leber, MD  Cholecalciferol (VITAMIN D3) 10 MCG (400 UNIT) CAPS Take 400 Units by mouth in the morning.    [provider]  clopidogrel (PLAVIX) 75 MG tablet Take 75 mg by mouth daily. 11/18/20   [provider]  escitalopram (LEXAPRO) 20 MG tablet Take 1.5 tablets (30 mg total) by mouth daily. 09/28/21   Merian Capron, MD  ferrous sulfate 325 (65 FE) MG EC tablet Take 1 tablet (325 mg total) by mouth in the morning and at bedtime. 03/19/21 09/13/21  Luetta Nutting, DO  guaiFENesin (MUCINEX) 600 MG 12 hr tablet Take 1 tablet (600 mg total) by mouth 2 (two) times daily. 10/14/21   Ghimire, Henreitta Leber, MD  latanoprost (XALATAN) 0.005 % ophthalmic solution Place 1 drop into both eyes at bedtime. 12/07/20   [provider]  magnesium oxide (MAG-OX) 400 MG tablet Take 400 mg by mouth daily.    [provider]  memantine (NAMENDA) 10 MG tablet TAKE 1 TABLET TWICE A DAY Patient taking differently: Take 10 mg by mouth 2 (two) times daily. 09/21/21   Alric Ran, MD  midodrine (PROAMATINE) 5 MG tablet Take 0.5 tablets (2.5 mg total) by mouth 3 (  three) times daily with meals. 10/14/21   Ghimire, Henreitta Leber, MD  mometasone-formoterol (DULERA) 200-5 MCG/ACT AERO Inhale 2 puffs into the lungs 2 (two) times daily. 09/14/21   Freddi Starr, MD  nitroGLYCERIN (NITROLINGUAL) 0.4 MG/SPRAY spray Place 1 spray under the tongue every 5 (five) minutes x 3 doses as needed for chest pain. 05/05/14   [provider]  oxyCODONE-acetaminophen (PERCOCET/ROXICET) 5-325 MG tablet Take 1 tablet by mouth every 6 (six) hours as needed for moderate pain. 10/14/21    Ghimire, Henreitta Leber, MD  pantoprazole (PROTONIX) 40 MG tablet Take 1 tablet (40 mg total) by mouth every evening. 07/07/21   Luetta Nutting, DO  polyethylene glycol (MIRALAX / GLYCOLAX) 17 g packet Take 17 g by mouth 2 (two) times daily. 10/14/21   Ghimire, Henreitta Leber, MD  predniSONE (DELTASONE) 20 MG tablet 40 mg p.o. daily for 1 week, then 30 mg p.o. daily for 1 week, 20 mg p.o. daily for 1 week, 10 mg p.o. daily for 1 week and then stop. 10/15/21   Ghimire, Henreitta Leber, MD  primidone (MYSOLINE) 50 MG tablet Take 2 tablets (100 mg total) by mouth 2 (two) times daily. 08/24/21 11/22/21  Alric Ran, MD  QUEtiapine (SEROQUEL) 50 MG tablet Take 2 tablets (100 mg total) by mouth at bedtime. 09/28/21 12/27/21  Merian Capron, MD  ranolazine (RANEXA) 1000 MG SR tablet Take 1,000 mg by mouth in the morning and at bedtime.    [provider]  rOPINIRole (REQUIP XL) 4 MG 24 hr tablet Take 1 tablet (4 mg total) by mouth at bedtime. 09/23/21 09/18/22  Alric Ran, MD  triamcinolone ointment (KENALOG) 0.1 % Apply 1 application. topically daily as needed (to affected areas- for psoriasis flares). 01/21/21   [provider]  FLUoxetine (PROZAC) 20 MG capsule Take 1 capsule (20 mg total) by mouth daily. 09/21/21 09/28/21  Merian Capron, MD      Allergies    Dilaudid [hydromorphone], Bactrim [sulfamethoxazole-trimethoprim], Benadryl [diphenhydramine], Phenobarbital, and Penicillins    Review of Systems   Review of Systems  Physical Exam Updated Vital Signs BP (!) 151/85   Pulse 100   Temp 98.9 F (37.2 C) (Oral)   Resp 16   Ht 6' 1" (1.854 m)   Wt 117 kg   SpO2 100%   BMI 34.03 kg/m   Physical Exam Vitals and nursing note reviewed.  Constitutional:      General: He is not in acute distress.    Appearance: He is well-developed.  HENT:     Head: Normocephalic and atraumatic.  Eyes:     General:        Right eye: No discharge.        Left eye: No discharge.     Conjunctiva/sclera:  Conjunctivae normal.  Cardiovascular:     Rate and Rhythm: Normal rate and regular rhythm.     Heart sounds: Murmur heard.  Pulmonary:     Effort: Pulmonary effort is normal.     Breath sounds: Examination of the right-middle field reveals rales. Examination of the right-lower field reveals rales. Rales present.     Comments: Patient speaking in full sentences.  Upon entering the room, patient requesting assistance lowering bed to near flat. Abdominal:     Palpations: Abdomen is soft.     Tenderness: There is no abdominal tenderness.     Comments: Mild distention  Musculoskeletal:     Cervical back: Normal range of motion and neck supple.  Right lower leg: No edema.     Left lower leg: No edema.  Skin:    General: Skin is warm and dry.  Neurological:     Mental Status: He is alert.     ED Results / Procedures / Treatments   Labs (all labs ordered are listed, but only abnormal results are displayed) Labs Reviewed  CBC WITH DIFFERENTIAL/PLATELET - Abnormal; Notable for the following components:      Result Value   WBC 15.9 (*)    RBC 3.42 (*)    Hemoglobin 11.0 (*)    HCT 34.8 (*)    MCV 101.8 (*)    Neutro Abs 13.4 (*)    Abs Immature Granulocytes 0.16 (*)    All other components within normal limits  COMPREHENSIVE METABOLIC PANEL - Abnormal; Notable for the following components:   Glucose, Bld 119 (*)    Albumin 2.8 (*)    All other components within normal limits  BRAIN NATRIURETIC PEPTIDE - Abnormal; Notable for the following components:   B Natriuretic Peptide 165.5 (*)    All other components within normal limits  I-STAT VENOUS BLOOD GAS, ED - Abnormal; Notable for the following components:   pO2, Ven 50 (*)    Bicarbonate 31.4 (*)    TCO2 33 (*)    Acid-Base Excess 4.0 (*)    HCT 34.0 (*)    Hemoglobin 11.6 (*)    All other components within normal limits  TROPONIN I (HIGH SENSITIVITY) - Abnormal; Notable for the following components:   Troponin I (High  Sensitivity) 58 (*)    All other components within normal limits  LACTIC ACID, PLASMA  LACTIC ACID, PLASMA  TROPONIN I (HIGH SENSITIVITY)    EKG EKG Interpretation  Date/Time:  Tuesday October 19 2021 10:17:56 EDT Ventricular Rate:  106 PR Interval:  146 QRS Duration: 87 QT Interval:  363 QTC Calculation: 482 R Axis:   10 Text Interpretation: Atrial-paced complexes Abnormal R-wave progression, early transition Nonspecific repol abnormality, lateral leads Confirmed by Regan Lemming ([id number]) on 10/19/2021 11:18:25 AM  Radiology No results found.  Procedures Procedures    Medications Ordered in ED Medications  ceFEPIme (MAXIPIME) 1 g in sodium chloride 0.9 % 100 mL IVPB (has no administration in time range)    ED Course/ Medical Decision Making/ A&P    Patient seen and examined. History obtained directly from patient as well as bedside rehab facility notes and recent hospitalization notes.   Labs/EKG: Ordered CBC, CMP, lactate, BNP and troponin, i-STAT VBG.  EKG.  Imaging: Ordered chest x-ray.  Medications/Fluids: None ordered, will continue supplemental oxygen.  Patient not hypoxic on 3 L.  Most recent vital signs reviewed and are as follows: BP (!) 151/85   Pulse 100   Temp 98.9 F (37.2 C) (Oral)   Resp 16   Ht _0  (1.854 m)   Wt 117 kg   SpO2 100%   BMI 34.03 kg/m   Initial impression: Shortness of breath, recent pneumonia.  1:41 PM Reassessment performed. Patient appears stable.   Discussed with Dr. Armandina Gemma who will see.   Labs personally reviewed and interpreted including: CBC with white blood cell count 15.9 trending upward, hemoglobin 11 macrocytosis; CMP unremarkable; BNP trending downward from recent hospitalization; VBG with normal venous pH, bicarb 31; lactic acid 1.3; troponin elevated at 58, awaiting second to trend but suspect demand at this point.  Imaging personally visualized and interpreted including: Chest x-ray, multifocal pneumonia,  worsening right airspace disease.  Reviewed pertinent lab work and imaging with patient at bedside. Questions answered.   Most current vital signs reviewed and are as follows: BP (!) 164/99   Pulse 90   Temp 98.9 F (37.2 C) (Oral)   Resp 17   Ht 6' 1" (1.854 m)   Wt 117 kg   SpO2 100%   BMI 34.03 kg/m   Plan: Admission to hospital for worsening multifocal pneumonia, now off antibiotics, continuing steroids.  X-ray appears worsened from previous.  White blood cell count trending upward.  Fortunately no hypoxia on current 3 L nasal cannula.  2:00 PM Consulted with Dr. Tamala Julian of [facility] who will see patient.                              Medical Decision Making Amount and/or Complexity of Data Reviewed Labs: ordered. Radiology: ordered.  Risk Prescription drug management. Decision regarding hospitalization.   Patient with worsening shortness of breath in setting of recent hospitalization for multifocal pneumonia.  Concern for sepsis.  Overall trend appears to be worsening off antibiotics.  Will restart cefepime/vanc for HCAP.  In regards to heart failure, BNP appears improving, but patient does report increased swelling in his abdomen which is typical.  Will defer further diuresis to admitting team.  Troponin mildly elevated from previous, likely due to demand, but will trend.  Low concern for ACS, PE at this time.  Review of previous cultures and evaluation from bronchoscopy negative.    Final Clinical Impression(s) / ED Diagnoses Final diagnoses:  Sepsis without acute organ dysfunction, due to unspecified organism [facility])  Multifocal pneumonia    Rx / DC Orders ED Discharge Orders     None          Carlisle Cater, PA-C 10/19/21 1402    Regan Lemming, MD 10/19/21 1429

## 2021-10-19 NOTE — ED Notes (Signed)
IV team at the bedside. 

## 2021-10-19 NOTE — ED Notes (Signed)
Patient transported to x-ray. ?

## 2021-10-19 NOTE — H&P (Addendum)
History and Physical    Patient: Mark Thomas ZOX:[id number] DOB: February 19, 1951 DOA: 10/19/2021 DOS: the patient was seen and examined on 10/19/2021 PCP: Luetta Nutting, DO  Patient coming from: Home  Chief Complaint:  Chief Complaint  Patient presents with   Shortness of Breath   HPI: Mark Thomas is a 71 y.o. male with medical history significant of CAD s/p PCI , PAF s/p Watchman device not on anticoagulation, CVA, ASD  s/p closure, vascular dementia, orthostatic hypotension on midodrine and fludrocortisone, and CKD stage III who presents with complaints of progressively worsening shortness of breath.  Patient had just recently been hospitalized 6/4-6/15 with hypoxia due to multifocal pneumonia.  He had underwent bronchoscopy by PCCM, but cultures did not reveal clear source of infection.  He been treated with 10 days of cefepime and recommended additional 5 days of oral Levaquin at discharge.  During the hospital stay patient had been evaluated by speech speech but noted to have had recent modified barium swallow on 4/28 that did not note any signs of aspiration.  He had been sent to rehab on 3 L of oxygen at rest and 5 L with exertion.  Since getting to rehab patient reports that he is felt like his chest is filling up again and he is drowning.  At the facility that turned up his oxygen to 4 L due to intermittent drops in his oxygenation.  He has had a cough, but reportedly has been nonproductive.  He reports that he has had some chest discomfort as well as abdominal swelling and distention.  Denies any significant fever, chills, nausea, vomiting, diarrhea.  During last hospitalization patient was reported to have episodes of hemoptysis, but were thought to be secondary to possible nosebleeds.  Yesterday, a chest x-ray had been done which showed concern for diffuse bilateral interstitial pulmonary infiltrates with a left worse than the right, moderate left-sided pleural effusion, and small right  sided pleural effusion.  Upon admission into the emergency department patient was noted to be afebrile with respirations 15-22, blood pressure was elevated at 164/99, and O2 saturation currently maintained on 4 L nasal cannula oxygen.  Labs significant for WBC 15.9, hemoglobin 11.6, albumin 2.8, BNP 165.5, and high-sensitivity troponin 58.  Patient has been started on empiric antibiotics of vancomycin and cefepime.   Review of Systems: As mentioned in the history of present illness. All other systems reviewed and are negative. Past Medical History:  Diagnosis Date   Anxiety    Coronary artery disease    Depression    Essential tremor    GERD (gastroesophageal reflux disease)    High cholesterol    Hypertension    Myocardial infarct (HCC)    Orthostatic hypotension    Stroke ([facility])    Vascular dementia [facility])    Past Surgical History:  Procedure Laterality Date   APPENDECTOMY     CARDIAC SURGERY     CHOLECYSTECTOMY     FLEXIBLE BRONCHOSCOPY N/A 10/06/2021   Procedure: FLEXIBLE BRONCHOSCOPY;  Surgeon: Margaretha Seeds, MD;  Location: [facility];  Service: Cardiopulmonary;  Laterality: N/A;   HERNIA REPAIR     NASAL SINUS SURGERY     SHOULDER SURGERY     Social History:  reports that he has never smoked. He has never been exposed to tobacco smoke. He has never used smokeless tobacco. He reports that he does not currently use alcohol. He reports that he does not use drugs.  Allergies  Allergen Reactions   Dilaudid [  Hydromorphone] Other (See Comments)    Respiratory Arrest!!!!   Bactrim [Sulfamethoxazole-Trimethoprim] Rash   Benadryl [Diphenhydramine] Other (See Comments)    Muscle spasms   Phenobarbital Other (See Comments)    From childhood- reaction not recalled at this time   Penicillins Other (See Comments)    From childhood- reaction not recalled at this time    Family History  Problem Relation Age of Onset   Hypertension Mother    Stroke Mother    Stroke Father     Hypertension Father     Prior to Admission medications   Medication Sig Start Date End Date Taking? Authorizing Provider  albuterol (PROVENTIL) (2.5 MG/3ML) 0.083% nebulizer solution Take 3 mLs (2.5 mg total) by nebulization every 6 (six) hours as needed for wheezing or shortness of breath. Patient taking differently: Take 2.5 mg by nebulization every 4 (four) hours as needed for shortness of breath. 07/15/21   Luetta Nutting, DO  albuterol (VENTOLIN HFA) 108 (90 Base) MCG/ACT inhaler Inhale 2 puffs into the lungs every 6 (six) hours as needed for wheezing or shortness of breath.    [provider]  allopurinol (ZYLOPRIM) 100 MG tablet Take 100 mg by mouth daily. 09/22/20   [provider]  aspirin 81 MG EC tablet Take 81 mg by mouth daily. 05/05/14   [provider]  atorvastatin (LIPITOR) 80 MG tablet Take 80 mg by mouth daily. 11/18/20   [provider]  b complex vitamins capsule Take 1 capsule by mouth daily.    [provider]  benzonatate (TESSALON) 100 MG capsule Take 1 capsule (100 mg total) by mouth 3 (three) times daily as needed for cough. 10/14/21   Ghimire, Henreitta Leber, MD  Cholecalciferol (VITAMIN D3) 10 MCG (400 UNIT) CAPS Take 400 Units by mouth in the morning.    [provider]  clopidogrel (PLAVIX) 75 MG tablet Take 75 mg by mouth daily. 11/18/20   [provider]  escitalopram (LEXAPRO) 20 MG tablet Take 1.5 tablets (30 mg total) by mouth daily. 09/28/21   Merian Capron, MD  ferrous sulfate 325 (65 FE) MG EC tablet Take 1 tablet (325 mg total) by mouth in the morning and at bedtime. 03/19/21 09/13/21  Luetta Nutting, DO  guaiFENesin (MUCINEX) 600 MG 12 hr tablet Take 1 tablet (600 mg total) by mouth 2 (two) times daily. 10/14/21   Ghimire, Henreitta Leber, MD  latanoprost (XALATAN) 0.005 % ophthalmic solution Place 1 drop into both eyes at bedtime. 12/07/20   [provider]  magnesium oxide (MAG-OX) 400 MG tablet Take 400  mg by mouth daily.    [provider]  memantine (NAMENDA) 10 MG tablet TAKE 1 TABLET TWICE A DAY Patient taking differently: Take 10 mg by mouth 2 (two) times daily. 09/21/21   Alric Ran, MD  midodrine (PROAMATINE) 5 MG tablet Take 0.5 tablets (2.5 mg total) by mouth 3 (three) times daily with meals. 10/14/21   Ghimire, Henreitta Leber, MD  mometasone-formoterol (DULERA) 200-5 MCG/ACT AERO Inhale 2 puffs into the lungs 2 (two) times daily. 09/14/21   Freddi Starr, MD  nitroGLYCERIN (NITROLINGUAL) 0.4 MG/SPRAY spray Place 1 spray under the tongue every 5 (five) minutes x 3 doses as needed for chest pain. 05/05/14   [provider]  oxyCODONE-acetaminophen (PERCOCET/ROXICET) 5-325 MG tablet Take 1 tablet by mouth every 6 (six) hours as needed for moderate pain. 10/14/21   Ghimire, Henreitta Leber, MD  pantoprazole (PROTONIX) 40 MG tablet Take 1 tablet (  40 mg total) by mouth every evening. 07/07/21   Luetta Nutting, DO  polyethylene glycol (MIRALAX / GLYCOLAX) 17 g packet Take 17 g by mouth 2 (two) times daily. 10/14/21   Ghimire, Henreitta Leber, MD  predniSONE (DELTASONE) 20 MG tablet 40 mg p.o. daily for 1 week, then 30 mg p.o. daily for 1 week, 20 mg p.o. daily for 1 week, 10 mg p.o. daily for 1 week and then stop. 10/15/21   Ghimire, Henreitta Leber, MD  primidone (MYSOLINE) 50 MG tablet Take 2 tablets (100 mg total) by mouth 2 (two) times daily. 08/24/21 11/22/21  Alric Ran, MD  QUEtiapine (SEROQUEL) 50 MG tablet Take 2 tablets (100 mg total) by mouth at bedtime. 09/28/21 12/27/21  Merian Capron, MD  ranolazine (RANEXA) 1000 MG SR tablet Take 1,000 mg by mouth in the morning and at bedtime.    [provider]  rOPINIRole (REQUIP XL) 4 MG 24 hr tablet Take 1 tablet (4 mg total) by mouth at bedtime. 09/23/21 09/18/22  Alric Ran, MD  triamcinolone ointment (KENALOG) 0.1 % Apply 1 application. topically daily as needed (to affected areas- for psoriasis flares). 01/21/21   [provider]  FLUoxetine (PROZAC) 20 MG capsule Take 1 capsule (20 mg total) by mouth daily. 09/21/21 09/28/21  Merian Capron, MD    Physical Exam: Vitals:   10/19/21 1021 10/19/21 1030 10/19/21 1130 10/19/21 1230  BP:  132/81 (!) 138/96 (!) 164/99  Pulse:  82 97 90  Resp:  15 (!) 22 17  Temp: 98.9 F (37.2 C)     TempSrc: Oral     SpO2:  99% 100% 100%  Weight:      Height:        Constitutional: Elderly male who appears to be in some respiratory discomfort while eating Eyes: PERRL, lids and conjunctivae normal ENMT: Mucous membranes are moist. Posterior pharynx clear of any exudate or lesions.Normal dentition.  Neck: normal, supple, no masses, no thyromegaly Respiratory: clear to auscultation bilaterally, no wheezing, no crackles. Normal respiratory effort. No accessory muscle use.  Cardiovascular: Regular rate and rhythm.  No significant lower extremity edema..  Abdomen: Protuberant abdomen without significant tenderness to palpation. Musculoskeletal: no clubbing / cyanosis. No joint deformity upper and lower extremities. Good ROM, no contractures. Normal muscle tone.  Skin: no rashes, lesions, ulcers. No induration Neurologic: CN 2-12 grossly intact. Strength 5/5 in all 4.  Psychiatric: Normal judgment and insight. Alert and oriented x 3. Normal mood.   Data Reviewed:   Atrial placed complexes at 106 bpm  Assessment and Plan: Acute respiratory failure with hypoxia secondary to recurrent pneumonia Patient presented with complaints of progressively worsening shortness of breath.  O2 saturation currently maintained on 4 L nasal cannula oxygen.  Chest x-ray at the outside facility noted diffuse bilateral interstitial pulmonary infiltrates left greater than the right with moderate left-sided pleural effusion and small right pleural effusion.  Cultures from prior hospitalization did not show any clear pathology to patient's symptoms. -Admit to a telemetry bed -Continuous pulse oximetry with  nasal cannula oxygen to maintain O2 saturation greater than 92% -Incentive spirometry and flutter valve -Check procalcitonin -Aspiration precautions with elevation head of the bed -Continue empiric antibiotics of vancomycin and cefepime -Continue prior steroid taper -Continue Dulera and albuterol nebs as needed -Mucinex -PCCM consulted at family request for assistance with respiratory status.  Pleural effusions Chronic heart failure with preserved EF Patient did report complaints of abdominal swelling and reported that it felt like  he was drowning.  Chest x-ray noted moderate left-sided pleural effusion and small right-sided pleural effusion. No significant lower extremity swelling physical exam BNP just mildly elevated at 165.5, but decreased when compared to prior admission on 6/5 of 471.1.  Last echocardiogram revealed EF of 70 to 75% with grade 2 diastolic dysfunction on 06/07/154.  Patient had been discharged home on furosemide 40 mg as needed. -Strict I&Os and daily weights -Give Lasix 40 mg IV daily -Reassess in a.m. and adjust IV diuresis as needed  Leukocytosis Acute.  WBC elevated at 15.9.  Lactic acid was reassuring at 1.3.  Patient has been on steroids, but chest x-ray showing worsening bilateral opacities. -Recheck CBC tomorrow morning  History of interstitial lung disease Suspected GERD contributing to patient's symptoms. -Recommended to continue PPI  Elevated troponin Patient did report complaints of chest discomfort.  High-sensitivity troponin 58.  Possibly secondary to demand in setting of respiratory distress. -Check repeat troponin  Orthostatic hypotension During last hospitalization blood pressures were noted to have fluctuated.  Metoprolol had been increased to 37.5 mg twice daily, fludrocortisone had been discontinued, and midodrine reduced to 2.5 mg 3 times daily.  However, looking at patient's discharge metoprolol.-Had been dropped off the discharge list  accidentally. -Continue midodrine.  -Restarted metoprolol as previously prescribed at 37.5 mg twice daily -PT/OT to eval and treat.  Sick sinus syndrome s/p PPM  SVT history of atrial fibrillation s/p ablation and Watchman device Evaluated by cardiology during last hospitalization for which he is not felt to need anticoagulation with Watchman device.  Noted to have atrial pacing with PACs and SVT.  Cards had recommended continuing metoprolol. -Continue beta-blocker as tolerated  Macrocytic anemia Chronic.  Hemoglobin 11 g/dL which appears improved from prior check on 6/12 of 9. -Continue to monitor  Coronary artery disease Patient with prior history of stents. -Continue aspirin, Plavix, and atorvastatin  History of CVA No residual deficits reported. -Continue antiplatelets and statin  Hypoalbuminemia Albumin 2.8 on admission. -Check prealbumin in a.m.  Vascular dementia Appears to be mild. -Delirium precautions -Continue memantine and Seroquel  Anxiety and depression -Continue Lexapro  GERD -Continue Protonix  Obesity BMI 34.03 kg/m2      Advance Care Planning:   Code Status: Full Code   Consults: PCCM  Family Communication: Discussed plan of care with the patient's wife present at bedside Severity of Illness: The appropriate patient status for this patient is INPATIENT. Inpatient status is judged to be reasonable and necessary in order to provide the required intensity of service to ensure the patient's safety. The patient's presenting symptoms, physical exam findings, and initial radiographic and laboratory data in the context of their chronic comorbidities is felt to place them at high risk for further clinical deterioration. Furthermore, it is not anticipated that the patient will be medically stable for discharge from the hospital within 2 midnights of admission.   * I certify that at the point of admission it is my clinical judgment that the patient will  require inpatient hospital care spanning beyond 2 midnights from the point of admission due to high intensity of service, high risk for further deterioration and high frequency of surveillance required.*  Author: Norval Morton, MD 10/19/2021 2:26 PM  For on call review [email or web address].

## 2021-10-19 NOTE — Progress Notes (Signed)
Pharmacy Antibiotic Note  Mark Thomas is a 71 y.o. male admitted on 10/19/2021 with pneumonia.  Pharmacy has been consulted for vancomycin dosing. Also with 1x dose of cefepime ordered per EDP. SCr 1.08 on presentation.  Plan: Vancomycin 2259m IV x 1; then 1g IV q12h. Goal AUC 400-550. Expected AUC: 520 SCr used: 1.08 Cefepime 2g IV x 1 ordered per EDP - f/u if to continue Monitor clinical progress, c/s, renal function F/u de-escalation plan/LOT, vancomycin levels as indicated   Height: 6' 1" (185.4 cm) Weight: 117 kg (257 lb 15 oz) IBW/kg (Calculated) : 79.9  Temp (24hrs), Avg:98.9 F (37.2 C), Min:98.9 F (37.2 C), Max:98.9 F (37.2 C)  Recent Labs  Lab 10/19/21 1200  WBC 15.9*  CREATININE 1.08  LATICACIDVEN 1.3    Estimated Creatinine Clearance: 87.7 mL/min (by C-G formula based on SCr of 1.08 mg/dL).    Allergies  Allergen Reactions   Dilaudid [Hydromorphone] Other (See Comments)    Respiratory Arrest!!!!   Bactrim [Sulfamethoxazole-Trimethoprim] Rash   Benadryl [Diphenhydramine] Other (See Comments)    Muscle spasms   Phenobarbital Other (See Comments)    From childhood- reaction not recalled at this time   Penicillins Other (See Comments)    From childhood- reaction not recalled at this time    HArturo Morton PharmD, BCPS Please check AMION for all [employer] numbers Clinical Pharmacist 10/19/2021 1:48 PM

## 2021-10-19 NOTE — ED Triage Notes (Signed)
Pt to ED via EMS from [facility] with c/o shortness of breath. Pt states he was diagnosed with pneumonia and admitted to [facility] for treatment, discharged last Thursday and admitted to [facility]. Patient reports worsening shortness of breath this morning with pain in chest when exerting himself or coughing. Patient alert and oriented x 4 on arrival to ED, VSS, NAD.  EMS v/s: 138/82 94 HR 97.7 temp 20 RR 100% 4L [place] baseline

## 2021-10-19 NOTE — Consult Note (Signed)
NAME:  Mark Thomas, MRN:  [id number], DOB:  [birth year], LOS: 0 ADMISSION DATE:  10/19/2021, CONSULTATION DATE:  6/20 REFERRING MD:  Tamala Julian, CHIEF COMPLAINT:  SOB   History of Present Illness:  Mark Thomas is a 71 y.o. 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  He did had COVID in 12/22 and has had problems with respiratory difficulty since.  He has been followed in the office by Dr. Erin Fulling.  Recent admission from 6/4 to 6/15 for multifocal pneumonia. He was discharged to rehab.   He presented again to the [facility] ED on 6/20 with concerns of worsening SOB, dyspnea.  PCCM consulted by [facility] for assistance with pneumonia management  Pertinent  Medical History  Mild ILD, chronic respiratory failure (3-4L) GERD,  CAD S/P stent, HTN, CKD, afib  Significant Hospital Events: Including procedures, antibiotic start and stop dates in addition to other pertinent events   PCCM consult  Interim History / Subjective:  See above  Subjective: endorses some SOB, denies chest pain  Objective   Blood pressure (!) 157/99, pulse 95, temperature 98.9 F (37.2 C), temperature source Oral, resp. rate 19, height _0  (1.854 m), weight 117 kg, SpO2 100 %.       No intake or output data in the 24 hours ending 10/19/21 1814 Filed Weights   10/19/21 1018  Weight: 117 kg    Examination: General: In bed, NAD, appears comfortable, chronically ill appearing HEENT: MM pink/moist, anicteric, atraumatic Neuro: RASS 0, PERRL 2m, GCS 15 CV: S1S2, NSR, no m/r/g appreciated PULM:  air movement in all lobes, trachea midline, chest expansion symmetric GI: rounded, bsx4 active, non-tender   Extremities: warm/dry, no pretibial edema, capillary refill less than 3 seconds  Skin:  no rashes or lesions noted  WBC 15.9 HGB 11 Cmp  reviewed Troponin 58, BNP 165 LaCTATE 1.3 VBG 7.35/56/50/31 CXR: multifocal pnu, with worsening airspace disease   Resolved Hospital Problem list     Assessment & Plan:  Possible HCAP Mild ILD Afebrile, slight leukocytosis, SPO2 99-100%, possible pneumonitis, patient could be aspirating at night.   -Continue Vanc/cefe -Follow up RVP, strep pnu, legonella, pct, fungitell, LDH, CRP, previous BALs -start Brovana and budesonide -continue pred 426m will potentially increased based on lab findings -PCCM will continue to follow along  GERD Constipation Abdomen enlarged, last BM Sunday 06/18 -Check KUB -Suppository now -Miralax, senna -Patient needs to sleep sitting greater than 30 degrees, no meals 2 hours prior to bed -ppi  All other issues per primary  Best Practice (right click and "Reselect all SmartList Selections" daily)   Per primary  Labs   CBC: Recent Labs  Lab 10/19/21 1200 10/19/21 1228  WBC 15.9*  --   NEUTROABS 13.4*  --   HGB 11.0* 11.6*  HCT 34.8* 34.0*  MCV 101.8*  --   PLT 204  --     Basic Metabolic Panel: Recent Labs  Lab 10/19/21 1200 10/19/21 1228  NA 140 139  K 3.9 3.9  CL 100  --   CO2 29  --   GLUCOSE 119*  --   BUN 19  --   CREATININE 1.08  --   CALCIUM 9.4  --    GFR: Estimated Creatinine Clearance: 87.7 mL/min (by C-G formula based on SCr of 1.08 mg/dL). Recent Labs  Lab  10/19/21 1200  WBC 15.9*  LATICACIDVEN 1.3    Liver Function Tests: Recent Labs  Lab 10/19/21 1200  AST 36  ALT 41  ALKPHOS 76  BILITOT 0.8  PROT 7.1  ALBUMIN 2.8*   No results for input(s): "LIPASE", "AMYLASE" in the last 168 hours. No results for input(s): "AMMONIA" in the last 168 hours.  ABG    Component Value Date/Time   HCO3 31.4 (H) 10/19/2021 1228   TCO2 33 (H) 10/19/2021 1228   O2SAT 82 10/19/2021 1228     Coagulation Profile: No results for input(s): "INR", "PROTIME" in the last 168 hours.  Cardiac Enzymes: No results for  input(s): "CKTOTAL", "CKMB", "CKMBINDEX", "TROPONINI" in the last 168 hours.  HbA1C: Hgb A1c MFr Bld  Date/Time Value Ref Range Status  01/08/2021 01:38 AM 6.0 (H) 4.8 - 5.6 % Final    Comment:    (NOTE) Pre diabetes:          5.7%-6.4%  Diabetes:              >6.4%  Glycemic control for   <7.0% adults with diabetes     CBG: No results for input(s): "GLUCAP" in the last 168 hours.  Review of Systems:   Positives in bold  Gen: fever, chills, weight change, fatigue, night sweats HEENT:  blurred vision, double vision, hearing loss, tinnitus, sinus congestion, rhinorrhea, sore throat, neck stiffness, dysphagia PULM:  shortness of breath, cough, sputum production, hemoptysis, wheezing CV: chest pain, edema, orthopnea, paroxysmal nocturnal dyspnea, palpitations GI:  abdominal pain, nausea, vomiting, diarrhea, hematochezia, melena, constipation, change in bowel habits GU: dysuria, hematuria, polyuria, oliguria, urethral discharge Endocrine: hot or cold intolerance, polyuria, polyphagia or appetite change Derm: rash, dry skin, scaling or peeling skin change Heme: easy bruising, bleeding, bleeding gums Neuro: headache, numbness, weakness, slurred speech, loss of memory or consciousness   Past Medical History:  He,  has a past medical history of Anxiety, Coronary artery disease, Depression, Essential tremor, GERD (gastroesophageal reflux disease), High cholesterol, Hypertension, Myocardial infarct (HCC), Orthostatic hypotension, Stroke ([place]), and Vascular dementia ([place]).   Surgical History:   Past Surgical History:  Procedure Laterality Date   APPENDECTOMY     CARDIAC SURGERY     CHOLECYSTECTOMY     FLEXIBLE BRONCHOSCOPY N/A 10/06/2021   Procedure: FLEXIBLE BRONCHOSCOPY;  Surgeon: Margaretha Seeds, MD;  Location: [facility];  Service: Cardiopulmonary;  Laterality: N/A;   HERNIA REPAIR     NASAL SINUS SURGERY     SHOULDER SURGERY       Social History:   reports that he has never  smoked. He has never been exposed to tobacco smoke. He has never used smokeless tobacco. He reports that he does not currently use alcohol. He reports that he does not use drugs.   Family History:  His family history includes Hypertension in his father and mother; Stroke in his father and mother.   Allergies Allergies  Allergen Reactions   Dilaudid [Hydromorphone] Other (See Comments)    Respiratory Arrest!!!!   Bactrim [Sulfamethoxazole-Trimethoprim] Rash   Benadryl [Diphenhydramine] Other (See Comments)    Muscle spasms   Phenobarbital Other (See Comments)    From childhood- reaction not recalled at this time   Penicillins Other (See Comments)    From childhood- reaction not recalled at this time     Home Medications  Prior to Admission medications   Medication Sig Start Date End Date Taking? Authorizing Provider  acetaminophen (TYLENOL) 500 MG tablet Take 500 mg  by mouth every 6 (six) hours as needed for mild pain.   Yes [provider]  albuterol (PROVENTIL) (2.5 MG/3ML) 0.083% nebulizer solution Take 3 mLs (2.5 mg total) by nebulization every 6 (six) hours as needed for wheezing or shortness of breath. Patient taking differently: Take 2.5 mg by nebulization every 4 (four) hours as needed for shortness of breath. 07/15/21  Yes Luetta Nutting, DO  albuterol (VENTOLIN HFA) 108 (90 Base) MCG/ACT inhaler Inhale 2 puffs into the lungs every 6 (six) hours as needed for wheezing or shortness of breath.   Yes [provider]  allopurinol (ZYLOPRIM) 100 MG tablet Take 100 mg by mouth daily. 09/22/20  Yes [provider]  aspirin 81 MG EC tablet Take 81 mg by mouth daily. 05/05/14  Yes [provider]  atorvastatin (LIPITOR) 80 MG tablet Take 80 mg by mouth daily. 11/18/20  Yes [provider]  b complex vitamins capsule Take 1 capsule by mouth daily.   Yes [provider]  benzonatate (TESSALON) 100 MG capsule Take 1 capsule (100 mg total) by  mouth 3 (three) times daily as needed for cough. 10/14/21  Yes Ghimire, Henreitta Leber, MD  Cholecalciferol (VITAMIN D3) 10 MCG (400 UNIT) CAPS Take 400 Units by mouth in the morning.   Yes [provider]  clopidogrel (PLAVIX) 75 MG tablet Take 75 mg by mouth daily. 11/18/20  Yes [provider]  escitalopram (LEXAPRO) 20 MG tablet Take 1.5 tablets (30 mg total) by mouth daily. 09/28/21  Yes Merian Capron, MD  ferrous sulfate 325 (65 FE) MG EC tablet Take 1 tablet (325 mg total) by mouth in the morning and at bedtime. 03/19/21 10/19/21 Yes Luetta Nutting, DO  furosemide (LASIX) 40 MG tablet Take 40 mg by mouth See admin instructions. One time, give 1 tablet for fluid build up.   Yes [provider]  guaiFENesin (MUCINEX) 600 MG 12 hr tablet Take 1 tablet (600 mg total) by mouth 2 (two) times daily. 10/14/21  Yes Ghimire, Henreitta Leber, MD  ipratropium-albuterol (DUONEB) 0.5-2.5 (3) MG/3ML SOLN Take 3 mLs by nebulization every 8 (eight) hours. 10/18/21  Yes [provider]  latanoprost (XALATAN) 0.005 % ophthalmic solution Place 1 drop into both eyes at bedtime. 12/07/20  Yes [provider]  magnesium oxide (MAG-OX) 400 MG tablet Take 400 mg by mouth daily.   Yes [provider]  memantine (NAMENDA) 10 MG tablet TAKE 1 TABLET TWICE A DAY Patient taking differently: Take 10 mg by mouth 2 (two) times daily. 09/21/21  Yes Camara, Maryan Puls, MD  midodrine (PROAMATINE) 5 MG tablet Take 0.5 tablets (2.5 mg total) by mouth 3 (three) times daily with meals. 10/14/21  Yes Ghimire, Henreitta Leber, MD  mometasone-formoterol (DULERA) 200-5 MCG/ACT AERO Inhale 2 puffs into the lungs 2 (two) times daily. 09/14/21  Yes Freddi Starr, MD  nitroGLYCERIN (NITROLINGUAL) 0.4 MG/SPRAY spray Place 1 spray under the tongue every 5 (five) minutes x 3 doses as needed for chest pain. 05/05/14  Yes [provider]  oxyCODONE-acetaminophen (PERCOCET/ROXICET) 5-325 MG tablet Take 1 tablet  by mouth every 6 (six) hours as needed for moderate pain. 10/14/21  Yes Ghimire, Henreitta Leber, MD  pantoprazole (PROTONIX) 40 MG tablet Take 1 tablet (40 mg total) by mouth every evening. 07/07/21  Yes Luetta Nutting, DO  polyethylene glycol (MIRALAX / GLYCOLAX) 17 g packet Take 17 g by mouth 2 (two) times daily. 10/14/21  Yes Ghimire, Henreitta Leber, MD  predniSONE (DELTASONE)  20 MG tablet 40 mg p.o. daily for 1 week, then 30 mg p.o. daily for 1 week, 20 mg p.o. daily for 1 week, 10 mg p.o. daily for 1 week and then stop. 10/15/21  Yes Ghimire, Henreitta Leber, MD  primidone (MYSOLINE) 50 MG tablet Take 2 tablets (100 mg total) by mouth 2 (two) times daily. 08/24/21 11/22/21 Yes Camara, Maryan Puls, MD  QUEtiapine (SEROQUEL) 50 MG tablet Take 2 tablets (100 mg total) by mouth at bedtime. 09/28/21 12/27/21 Yes Merian Capron, MD  ranolazine (RANEXA) 1000 MG SR tablet Take 1,000 mg by mouth in the morning and at bedtime.   Yes [provider]  rOPINIRole (REQUIP XL) 4 MG 24 hr tablet Take 1 tablet (4 mg total) by mouth at bedtime. 09/23/21 09/18/22 Yes Camara, Maryan Puls, MD  triamcinolone ointment (KENALOG) 0.1 % Apply 1 application. topically daily as needed (to affected areas- for psoriasis flares). 01/21/21  Yes [provider]  FLUoxetine (PROZAC) 20 MG capsule Take 1 capsule (20 mg total) by mouth daily. 09/21/21 09/28/21  Merian Capron, MD     Critical care time: n/a     [employer] MSN, APRN, AGACNP-BC [facility]  10/19/2021 , 6:55 PM  Please see [email or web address] for pager details  If no response, please call [phone] After hours, please call Elink at [phone]

## 2021-10-19 NOTE — ED Notes (Signed)
Patient transported to X-ray.

## 2021-10-20 DIAGNOSIS — J9601 Acute respiratory failure with hypoxia: Secondary | ICD-10-CM | POA: Diagnosis not present

## 2021-10-20 DIAGNOSIS — J849 Interstitial pulmonary disease, unspecified: Secondary | ICD-10-CM | POA: Diagnosis not present

## 2021-10-20 DIAGNOSIS — J189 Pneumonia, unspecified organism: Secondary | ICD-10-CM | POA: Diagnosis not present

## 2021-10-20 LAB — VITAMIN B12: Vitamin B-12: 548 pg/mL (ref 180–914)

## 2021-10-20 LAB — CBC
HCT: 33.6 % — ABNORMAL LOW (ref 39.0–52.0)
Hemoglobin: 10.5 g/dL — ABNORMAL LOW (ref 13.0–17.0)
MCH: 32.5 pg (ref 26.0–34.0)
MCHC: 31.3 g/dL (ref 30.0–36.0)
MCV: 104 fL — ABNORMAL HIGH (ref 80.0–100.0)
Platelets: 187 10*3/uL (ref 150–400)
RBC: 3.23 MIL/uL — ABNORMAL LOW (ref 4.22–5.81)
RDW: 15.5 % (ref 11.5–15.5)
WBC: 12 10*3/uL — ABNORMAL HIGH (ref 4.0–10.5)
nRBC: 0 % (ref 0.0–0.2)

## 2021-10-20 LAB — PROCALCITONIN: Procalcitonin: <0.1 ng/mL

## 2021-10-20 LAB — BASIC METABOLIC PANEL
Anion gap: 11 (ref 5–15)
BUN: 22 mg/dL (ref 8–23)
CO2: 27 mmol/L (ref 22–32)
Calcium: 8.9 mg/dL (ref 8.9–10.3)
Chloride: 104 mmol/L (ref 98–111)
Creatinine, Ser: 1.08 mg/dL (ref 0.61–1.24)
GFR, Estimated: >60 mL/min (ref >60)
Glucose, Bld: 97 mg/dL (ref 70–99)
Potassium: 4 mmol/L (ref 3.5–5.1)
Sodium: 142 mmol/L (ref 135–145)

## 2021-10-20 LAB — LACTIC ACID, PLASMA: Lactic Acid, Venous: 1 mmol/L (ref 0.5–1.9)

## 2021-10-20 LAB — TSH: TSH: 3.377 u[IU]/mL (ref 0.350–4.500)

## 2021-10-20 LAB — TROPONIN I (HIGH SENSITIVITY): Troponin I (High Sensitivity): 68 ng/L — ABNORMAL HIGH (ref <18)

## 2021-10-20 LAB — LACTATE DEHYDROGENASE: LDH: 213 U/L — ABNORMAL HIGH (ref 98–192)

## 2021-10-20 LAB — PREALBUMIN: Prealbumin: 19.8 mg/dL (ref 18–38)

## 2021-10-20 LAB — SEDIMENTATION RATE: Sed Rate: 79 mm/hr — ABNORMAL HIGH (ref 0–16)

## 2021-10-20 LAB — MRSA NEXT GEN BY PCR, NASAL: MRSA by PCR Next Gen: NOT DETECTED

## 2021-10-20 LAB — C-REACTIVE PROTEIN: CRP: 7.1 mg/dL — ABNORMAL HIGH (ref <1.0)

## 2021-10-20 MED ORDER — GUAIFENESIN 100 MG/5ML PO LIQD
5.0000 mL | ORAL | Status: DC | PRN
  Administered 2021-10-25: 5 mL via ORAL
  Filled 2021-10-20: qty 10

## 2021-10-20 MED ORDER — HYDRALAZINE HCL 20 MG/ML IJ SOLN: 10.0000 mg | INTRAMUSCULAR | Status: DC | PRN

## 2021-10-20 MED ORDER — MIDODRINE HCL 5 MG PO TABS
5.0000 mg | ORAL_TABLET | Freq: Three times a day (TID) | ORAL | Status: DC
  Administered 2021-10-20 – 2021-10-26 (×12): 5 mg via ORAL
  Filled 2021-10-20 (×15): qty 1

## 2021-10-20 MED ORDER — ORAL CARE MOUTH RINSE: 15.0000 mL | OROMUCOSAL | Status: DC | PRN

## 2021-10-20 MED ORDER — SENNOSIDES-DOCUSATE SODIUM 8.6-50 MG PO TABS: 1.0000 | ORAL_TABLET | Freq: Every evening | ORAL | Status: DC | PRN

## 2021-10-20 MED ORDER — OXYCODONE-ACETAMINOPHEN 5-325 MG PO TABS
1.0000 | ORAL_TABLET | Freq: Three times a day (TID) | ORAL | Status: DC | PRN
  Administered 2021-10-20 – 2021-10-22 (×5): 1 via ORAL
  Filled 2021-10-20 (×5): qty 1

## 2021-10-20 MED ORDER — METOPROLOL TARTRATE 5 MG/5ML IV SOLN: 5.0000 mg | INTRAVENOUS | Status: DC | PRN

## 2021-10-20 MED ORDER — IPRATROPIUM-ALBUTEROL 0.5-2.5 (3) MG/3ML IN SOLN
3.0000 mL | Freq: Three times a day (TID) | RESPIRATORY_TRACT | Status: DC
  Administered 2021-10-20 – 2021-10-23 (×8): 3 mL via RESPIRATORY_TRACT
  Filled 2021-10-20 (×8): qty 3

## 2021-10-20 MED ORDER — FUROSEMIDE 10 MG/ML IJ SOLN: 40.0000 mg | Freq: Two times a day (BID) | INTRAMUSCULAR | Status: DC

## 2021-10-20 MED ORDER — SODIUM CHLORIDE 0.9 % IV SOLN
2.0000 g | Freq: Three times a day (TID) | INTRAVENOUS | Status: AC
  Administered 2021-10-20 – 2021-10-24 (×15): 2 g via INTRAVENOUS
  Filled 2021-10-20 (×15): qty 12.5

## 2021-10-20 MED ORDER — FUROSEMIDE 10 MG/ML IJ SOLN
60.0000 mg | Freq: Two times a day (BID) | INTRAMUSCULAR | Status: DC
  Administered 2021-10-20: 60 mg via INTRAVENOUS
  Filled 2021-10-20: qty 6

## 2021-10-20 NOTE — ED Notes (Signed)
ED TO INPATIENT HANDOFF REPORT  ED Nurse Name and Phone #: Caryl Pina RN [phone]   S Name/Age/Gender Mark Thomas 71 y.o. male Room/Bed: 045C/045C  Code Status   Code Status: Full Code  Home/SNF/Other [name] Patient oriented to: self, place, and situation Is this baseline? Yes   Triage Complete: Triage complete  Chief Complaint Recurrent pneumonia [J18.9]  Triage Note Pt to ED via EMS from [facility] with c/o shortness of breath. Pt states he was diagnosed with pneumonia and admitted to [facility] for treatment, discharged last Thursday and admitted to [facility]. Patient reports worsening shortness of breath this morning with pain in chest when exerting himself or coughing. Patient alert and oriented x 4 on arrival to ED, VSS, NAD.  EMS v/s: 138/82 94 HR 97.7 temp 20 RR 100% 4L [name] baseline    Allergies Allergies  Allergen Reactions   Dilaudid [Hydromorphone] Other (See Comments)    Respiratory Arrest!!!!   Bactrim [Sulfamethoxazole-Trimethoprim] Rash   Benadryl [Diphenhydramine] Other (See Comments)    Muscle spasms   Phenobarbital Other (See Comments)    From childhood- reaction not recalled at this time   Penicillins Other (See Comments)    From childhood- reaction not recalled at this time    Level of Care/Admitting Diagnosis ED Disposition     ED Disposition  Admit   Condition  --   [name]: [name] [[id number]]  Level of Care: Telemetry Medical [104]  May admit patient to [facility] or [facility] if equivalent level of care is available:: No  Covid Evaluation: Asymptomatic - no recent exposure (last 10 days) testing not required  Diagnosis: Recurrent pneumonia [614431]  Admitting Physician: Norval Morton [[id number]]  Attending Physician: Norval Morton [[id number]]  Estimated length of stay: past midnight tomorrow  Certification:: I certify this patient will need inpatient services for at least  2 midnights          B Medical/Surgery History Past Medical History:  Diagnosis Date   Anxiety    Coronary artery disease    Depression    Essential tremor    GERD (gastroesophageal reflux disease)    High cholesterol    Hypertension    Myocardial infarct (HCC)    Orthostatic hypotension    Stroke ([place])    Vascular dementia [name])    Past Surgical History:  Procedure Laterality Date   APPENDECTOMY     CARDIAC SURGERY     CHOLECYSTECTOMY     FLEXIBLE BRONCHOSCOPY N/A 10/06/2021   Procedure: FLEXIBLE BRONCHOSCOPY;  Surgeon: Margaretha Seeds, MD;  Location: [place];  Service: Cardiopulmonary;  Laterality: N/A;   HERNIA REPAIR     NASAL SINUS SURGERY     SHOULDER SURGERY       A IV Location/Drains/Wounds Patient Lines/Drains/Airways Status     Active Line/Drains/Airways     Name Placement date Placement time Site Days   Peripheral IV 10/19/21 20 G 1.88" Anterior;Left Forearm 10/19/21  1652  Forearm  1   External Urinary Catheter 10/12/21  1000  --  8   Incision (Closed) 10/06/21 N/A 10/06/21  1217  -- 14            Intake/Output Last 24 hours  Intake/Output Summary (Last 24 hours) at 10/20/2021 0201 Last data filed at 10/19/2021 2230 Gross per 24 hour  Intake --  Output 1000 ml  Net -1000 ml    Labs/Imaging Results for orders placed or  performed during the hospital encounter of 10/19/21 (from the past 48 hour(s))  CBC with Differential     Status: Abnormal   Collection Time: 10/19/21 12:00 PM  Result Value Ref Range   WBC 15.9 (H) 4.0 - 10.5 K/uL   RBC 3.42 (L) 4.22 - 5.81 MIL/uL   Hemoglobin 11.0 (L) 13.0 - 17.0 g/dL   HCT 34.8 (L) 39.0 - 52.0 %   MCV 101.8 (H) 80.0 - 100.0 fL   MCH 32.2 26.0 - 34.0 pg   MCHC 31.6 30.0 - 36.0 g/dL   RDW 15.4 11.5 - 15.5 %   Platelets 204 150 - 400 K/uL   nRBC 0.0 0.0 - 0.2 %   Neutrophils Relative % 83 %   Neutro Abs 13.4 (H) 1.7 - 7.7 K/uL   Lymphocytes Relative 8 %   Lymphs Abs 1.2 0.7 - 4.0 K/uL   Monocytes  Relative 7 %   Monocytes Absolute 1.0 0.1 - 1.0 K/uL   Eosinophils Relative 1 %   Eosinophils Absolute 0.1 0.0 - 0.5 K/uL   Basophils Relative 0 %   Basophils Absolute 0.0 0.0 - 0.1 K/uL   Immature Granulocytes 1 %   Abs Immature Granulocytes 0.16 (H) 0.00 - 0.07 K/uL    Comment: Performed at [name], [address] [place], [place] [address]  Comprehensive metabolic panel     Status: Abnormal   Collection Time: 10/19/21 12:00 PM  Result Value Ref Range   Sodium 140 135 - 145 mmol/L   Potassium 3.9 3.5 - 5.1 mmol/L   Chloride 100 98 - 111 mmol/L   CO2 29 22 - 32 mmol/L   Glucose, Bld 119 (H) 70 - 99 mg/dL    Comment: Glucose reference range applies only to samples taken after fasting for at least 8 hours.   BUN 19 8 - 23 mg/dL   Creatinine, Ser 1.08 0.61 - 1.24 mg/dL   Calcium 9.4 8.9 - 10.3 mg/dL   Total Protein 7.1 6.5 - 8.1 g/dL   Albumin 2.8 (L) 3.5 - 5.0 g/dL   AST 36 15 - 41 U/L   ALT 41 0 - 44 U/L   Alkaline Phosphatase 76 38 - 126 U/L   Total Bilirubin 0.8 0.3 - 1.2 mg/dL   GFR, Estimated >60 >60 mL/min    Comment: (NOTE) Calculated using the CKD-EPI Creatinine Equation (2021)    Anion gap 11 5 - 15    Comment: Performed at [name] [address], [place], [place] [address]  Troponin I (High Sensitivity)     Status: Abnormal   Collection Time: 10/19/21 12:00 PM  Result Value Ref Range   Troponin I (High Sensitivity) 58 (H) <18 ng/L    Comment: (NOTE) Elevated high sensitivity troponin I (hsTnI) values and significant  changes across serial measurements may suggest ACS but many other  chronic and acute conditions are known to elevate hsTnI results.  Refer to the "Links" section for chest pain algorithms and additional  guidance. Performed at [name], [place] [address], [place], [place] [address]   Brain natriuretic peptide     Status: Abnormal   Collection Time: 10/19/21 12:00 PM  Result Value Ref Range   B Natriuretic Peptide  165.5 (H) 0.0 - 100.0 pg/mL    Comment: Performed at [name] [address], [place], [place] [address]  Lactic acid, plasma     Status: None   Collection Time: 10/19/21 12:00  PM  Result Value Ref Range   Lactic Acid, Venous 1.3 0.5 - 1.9 mmol/L    Comment: Performed at [facility] [address], [place], [place] [address]  I-Stat venous blood gas, [facility] ED only)     Status: Abnormal   Collection Time: 10/19/21 12:28 PM  Result Value Ref Range   pH, Ven 7.350 7.25 - 7.43   pCO2, Ven 56.8 44 - 60 mmHg   pO2, Ven 50 (H) 32 - 45 mmHg   Bicarbonate 31.4 (H) 20.0 - 28.0 mmol/L   TCO2 33 (H) 22 - 32 mmol/L   O2 Saturation 82 %   Acid-Base Excess 4.0 (H) 0.0 - 2.0 mmol/L   Sodium 139 135 - 145 mmol/L   Potassium 3.9 3.5 - 5.1 mmol/L   Calcium, Ion 1.23 1.15 - 1.40 mmol/L   HCT 34.0 (L) 39.0 - 52.0 %   Hemoglobin 11.6 (L) 13.0 - 17.0 g/dL   Sample type VENOUS   Respiratory (~20 pathogens) panel by PCR     Status: None   Collection Time: 10/19/21 10:33 PM   Specimen: Urine, Clean Catch; Respiratory  Result Value Ref Range   Adenovirus NOT DETECTED NOT DETECTED   Coronavirus 229E NOT DETECTED NOT DETECTED    Comment: (NOTE) The Coronavirus on the Respiratory Panel, DOES NOT test for the novel  Coronavirus (2019 nCoV)    Coronavirus HKU1 NOT DETECTED NOT DETECTED   Coronavirus NL63 NOT DETECTED NOT DETECTED   Coronavirus OC43 NOT DETECTED NOT DETECTED   Metapneumovirus NOT DETECTED NOT DETECTED   Rhinovirus / Enterovirus NOT DETECTED NOT DETECTED   Influenza A NOT DETECTED NOT DETECTED   Influenza B NOT DETECTED NOT DETECTED   Parainfluenza Virus 1 NOT DETECTED NOT DETECTED   Parainfluenza Virus 2 NOT DETECTED NOT DETECTED   Parainfluenza Virus 3 NOT DETECTED NOT DETECTED   Parainfluenza Virus 4 NOT DETECTED NOT DETECTED   Respiratory Syncytial Virus NOT DETECTED NOT DETECTED   Bordetella pertussis NOT DETECTED NOT DETECTED   Bordetella Parapertussis NOT DETECTED  NOT DETECTED   Chlamydophila pneumoniae NOT DETECTED NOT DETECTED   Mycoplasma pneumoniae NOT DETECTED NOT DETECTED    Comment: Performed at [facility], [address] [place], [place] [address]  Strep pneumoniae urinary antigen     Status: None   Collection Time: 10/19/21 10:33 PM  Result Value Ref Range   Strep Pneumo Urinary Antigen NEGATIVE NEGATIVE    Comment:        Infection due to S. pneumoniae cannot be absolutely ruled out since the antigen present may be below the detection limit of the test. Performed at [facility], [address] [place], [place] [address]    DG Abd 1 View  Result Date: 10/19/2021 CLINICAL DATA:  [phone]. Encounter for Abdominal distension, shortness of breath EXAM: ABDOMEN - 1 VIEW COMPARISON:  None Available. FINDINGS: Atherosclerotic plaque. The bowel gas pattern is normal. No radio-opaque calculi or other significant radiographic abnormality are seen. Two lead cardiac pacemaker partially visualized. Coronary artery stents. IMPRESSION: 1. Nonobstructive bowel gas pattern. 2. Aortic Atherosclerosis (ICD10-I70.0). 3. Please see separately dictated chest x-ray 10/19/2021. Electronically Signed   By: Iven Finn M.D.   On: 10/19/2021 19:52   DG Chest 2 View  Result Date: 10/19/2021 CLINICAL DATA:  PNA, worsening SOB EXAM: CHEST - 2 VIEW COMPARISON:  Radiograph 10/12/2021 FINDINGS: Unchanged dual chamber pacemaker leads. Unchanged cardiomediastinal silhouette. Persistent bilateral interstitial and airspace disease, worsening in  the right mid and medial basilar lung. Stable small bilateral pleural effusions. No pneumothorax. No acute osseous abnormality. IMPRESSION: Persistent multifocal pneumonia with increasing airspace disease in the right mid and medial basilar lung. Stable bilateral pleural effusions. Electronically Signed   By: Maurine Simmering M.D.   On: 10/19/2021 11:18    Pending Labs Unresulted Labs (From admission, onward)     Start      Ordered   10/20/21 0500  CBC  Tomorrow morning,   R       Question:  Specimen collection method  Answer:  IV Team=IV Team collect   10/19/21 1449   10/20/21 6962  Basic metabolic panel  Tomorrow morning,   R       Question:  Specimen collection method  Answer:  IV Team=IV Team collect   10/19/21 1449   10/20/21 0500  Prealbumin  Tomorrow morning,   R       Question:  Specimen collection method  Answer:  IV Team=IV Team collect   10/19/21 1449   10/19/21 1823  Legionella Pneumophila Serogp 1 Ur Ag  Once,   R        10/19/21 1822   10/19/21 1820  Lactate dehydrogenase  Once,   R       Question:  Specimen collection method  Answer:  IV Team=IV Team collect   10/19/21 1819   10/19/21 1820  Fungitell, Serum  Once,   R       Question:  Specimen collection method  Answer:  IV Team=IV Team collect   10/19/21 1819   10/19/21 1811  Procalcitonin - Baseline  Add-on,   AD       Question:  Specimen collection method  Answer:  IV Team=IV Team collect   10/19/21 1810   10/19/21 1449  Sedimentation rate  Once,   R       Question:  Specimen collection method  Answer:  IV Team=IV Team collect   10/19/21 1449   10/19/21 1449  C-reactive protein  Once,   R       Question:  Specimen collection method  Answer:  IV Team=IV Team collect   10/19/21 1449   10/19/21 1033  Lactic acid, plasma  Now then every 2 hours,   R (with STAT occurrences)      10/19/21 1034            Vitals/Pain Today's Vitals   10/19/21 2120 10/19/21 2230 10/19/21 2232 10/19/21 2300  BP:    119/89  Pulse:    98  Resp:    19  Temp:      TempSrc:      SpO2:    95%  Weight:      Height:      PainSc: _0 Isolation Precautions No active isolations  Medications Medications  vancomycin (VANCOCIN) IVPB 1000 mg/200 mL premix (has no administration in time range)  enoxaparin (LOVENOX) injection 55 mg (55 mg Subcutaneous Given 10/19/21 2108)  sodium chloride flush (NS) 0.9 % injection 3 mL (3 mLs Intravenous Given  10/19/21 2114)  acetaminophen (TYLENOL) tablet 650 mg (has no administration in time range)    Or  acetaminophen (TYLENOL) suppository 650 mg (has no administration in time range)  albuterol (PROVENTIL) (2.5 MG/3ML) 0.083% nebulizer solution 2.5 mg (has no administration in time range)  oxyCODONE-acetaminophen (PERCOCET/ROXICET) 5-325 MG per tablet 1 tablet (1 tablet Oral Given 10/19/21 2123)  furosemide (LASIX) injection 40 mg (  40 mg Intravenous Given 10/19/21 1903)  aspirin EC tablet 81 mg (81 mg Oral Given 10/19/21 1903)  allopurinol (ZYLOPRIM) tablet 100 mg (100 mg Oral Given 10/19/21 1903)  atorvastatin (LIPITOR) tablet 80 mg (80 mg Oral Given 10/19/21 1903)  midodrine (PROAMATINE) tablet 2.5 mg (has no administration in time range)  ranolazine (RANEXA) 12 hr tablet 1,000 mg (1,000 mg Oral Given 10/19/21 2110)  clopidogrel (PLAVIX) tablet 75 mg (75 mg Oral Given 10/19/21 1903)  rOPINIRole (REQUIP XL) 24 hr tablet 4 mg (4 mg Oral Given 10/19/21 2109)  primidone (MYSOLINE) tablet 100 mg (100 mg Oral Given 10/19/21 2110)  guaiFENesin (MUCINEX) 12 hr tablet 600 mg (600 mg Oral Given 10/19/21 2109)  polyethylene glycol (MIRALAX / GLYCOLAX) packet 17 g (17 g Oral Patient Refused/Not Given 10/19/21 2110)  pantoprazole (PROTONIX) EC tablet 40 mg (40 mg Oral Given 10/19/21 1903)  magnesium oxide (MAG-OX) tablet 400 mg (400 mg Oral Given 10/19/21 1903)  predniSONE (DELTASONE) tablet 40 mg (has no administration in time range)  QUEtiapine (SEROQUEL) tablet 100 mg (100 mg Oral Given 10/19/21 2108)  memantine (NAMENDA) tablet 10 mg (10 mg Oral Given 10/19/21 2109)  escitalopram (LEXAPRO) tablet 30 mg (30 mg Oral Given 10/19/21 1903)  metoprolol tartrate (LOPRESSOR) tablet 37.5 mg (37.5 mg Oral Given 10/19/21 2109)  budesonide (PULMICORT) nebulizer solution 0.25 mg (0.25 mg Nebulization Given 10/19/21 2110)  arformoterol (BROVANA) nebulizer solution 15 mcg (15 mcg Nebulization Given 10/19/21 2111)   ipratropium-albuterol (DUONEB) 0.5-2.5 (3) MG/3ML nebulizer solution 3 mL (3 mLs Nebulization Given 10/19/21 2110)  ceFEPIme (MAXIPIME) 2 g in sodium chloride 0.9 % 100 mL IVPB (0 g Intravenous Stopped 10/19/21 1738)  vancomycin (VANCOCIN) 2,250 mg in sodium chloride 0.9 % 500 mL IVPB (0 mg Intravenous Stopped 10/19/21 2010)    Mobility walks with device Moderate fall risk   Focused Assessments Pulmonary Assessment Handoff:  Lung sounds: Bilateral Breath Sounds: Clear O2 Device: Nasal Cannula O2 Flow Rate (L/min): 4 L/min    R Recommendations: See Admitting Provider Note  Report given to:   Additional Notes: Pt is in rehab at [facility], was just [place] to there on Thursday. Was instructed to wear 3L [place] while still and 5L [place] with exertion, had to increase to 4L  due to drop in O2 Sat came in because he felt increased pain and discomfort in chest. Getting IV abx. Has a condom cath on per request due to getting 26m IV lasix. A&O x3, he states "I have early dementia sometimes I forget what day it is". Otherwise able to answer all orientation questions appropriately. Repetitive questioning at times due to forgetting he has already asked. 20GRFA placed by IV team.

## 2021-10-20 NOTE — Procedures (Signed)
Bedside Chest [place]:  Showing bilateral atelectasis, no pleural effusion was detected.

## 2021-10-20 NOTE — Consult Note (Signed)
NAME:  Mark Thomas, MRN:  [id number], DOB:  January 19, 1951, LOS: 1 ADMISSION DATE:  10/19/2021, CONSULTATION DATE:  6/20 REFERRING MD:  Tamala Julian, CHIEF COMPLAINT:  SOB   History of Present Illness:  Mark Thomas is a 71 y.o. 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  He did had COVID in 12/22 and has had problems with respiratory difficulty since.  He has been followed in the office by Dr. Erin Fulling.  Recent admission from 6/4 to 6/15 for multifocal pneumonia. He was discharged to rehab.   He presented [facility] to [facility] ED [facility] 6/20 with concerns of worsening SOB, dyspnea.  PCCM consulted by [facility] for assistance with pneumonia management  Pertinent  Medical History  Mild ILD, chronic respiratory failure (3-4L) GERD,  CAD S/P stent, HTN, CKD, afib  Significant Hospital Events: Including procedures, antibiotic start and stop dates in addition to other pertinent events   PCCM consult  Interim History / Subjective:  Reports not feeling any better.  He has a -9 over 24 hours.  Objective   Blood pressure 121/73, pulse 80, temperature 98 F (36.7 C), temperature source Oral, resp. rate 16, height _0  (1.854 m), weight 106.2 kg, SpO2 96 %.        Intake/Output Summary (Last 24 hours) at 10/20/2021 1117 Last data filed at 10/20/2021 0319 Gross per 24 hour  Intake 440.14 ml  Output 1350 ml  Net -909.86 ml   Filed Weights   10/19/21 1018 10/20/21 0300  Weight: 117 kg 106.2 kg    Examination: General: Frail elderly HEENT: MM pink/moist no JVD is appreciated Neuro: Grossly intact without focal defect CV: Heart sounds are regular PULM: Decreased in the bases bilaterally GI: soft, bsx4 active  GU: Amber urine Extremities: warm/dry, negative edema  Skin: no rashes or lesions   Resolved Hospital Problem list      Assessment & Plan:  Possible HCAP Mild ILD Currently on cefepime and vancomycin White count is improving more aggressive diuresis Bronchodilator Currently on steroids No need for thoracentesis    GERD Constipation Abdomen enlarged, last BM Sunday 06/18 KUB demonstrated nonobstructive bowel pattern MiraLAX and senna PPI   All other issues per primary  Best Practice (right click and "Reselect all SmartList Selections" daily)   Per primary  Labs   CBC: Recent Labs  Lab 10/19/21 1200 10/19/21 1228 10/20/21 0456  WBC 15.9*  --  12.0*  NEUTROABS 13.4*  --   --   HGB 11.0* 11.6* 10.5*  HCT 34.8* 34.0* 33.6*  MCV 101.8*  --  104.0*  PLT 204  --  370    Basic Metabolic Panel: Recent Labs  Lab 10/19/21 1200 10/19/21 1228 10/20/21 0456  NA 140 139 142  K 3.9 3.9 4.0  CL 100  --  104  CO2 29  --  27  GLUCOSE 119*  --  97  BUN 19  --  22  CREATININE 1.08  --  1.08  CALCIUM 9.4  --  8.9   GFR: Estimated Creatinine Clearance: 83.7 mL/min (by C-G formula based on SCr of 1.08 mg/dL). Recent Labs  Lab 10/19/21 1200 10/20/21 0442 10/20/21 0456  PROCALCITON  --  <0.10  --   WBC 15.9*  --  12.0*  LATICACIDVEN 1.3 1.0  --     Liver Function Tests:  Recent Labs  Lab 10/19/21 1200  AST 36  ALT 41  ALKPHOS 76  BILITOT 0.8  PROT 7.1  ALBUMIN 2.8*   No results for input(s): "LIPASE", "AMYLASE" in the last 168 hours. No results for input(s): "AMMONIA" in the last 168 hours.  ABG    Component Value Date/Time   HCO3 31.4 (H) 10/19/2021 1228   TCO2 33 (H) 10/19/2021 1228   O2SAT 82 10/19/2021 1228     Coagulation Profile: No results for input(s): "INR", "PROTIME" in the last 168 hours.  Cardiac Enzymes: No results for input(s): "CKTOTAL", "CKMB", "CKMBINDEX", "TROPONINI" in the last 168 hours.  HbA1C: Hgb A1c MFr Bld  Date/Time Value Ref Range Status  01/08/2021 01:38 AM 6.0 (H) 4.8 - 5.6 % Final    Comment:    (NOTE) Pre diabetes:           5.7%-6.4%  Diabetes:              >6.4%  Glycemic control for   <7.0% adults with diabetes     CBG: No results for input(s): "GLUCAP" in the last 168 hours.    Critical care time: n/a    Richardson Landry Theophil Thivierge ACNP Acute Care Nurse Practitioner [place] Please consult Amion 10/20/2021, 11:18 AM

## 2021-10-20 NOTE — Plan of Care (Signed)

## 2021-10-20 NOTE — Evaluation (Signed)
Physical Therapy Evaluation Patient Details Name: Mark Thomas MRN: [id number] DOB: 30-Jul-1950 Today's Date: 10/20/2021  History of Present Illness  72M admitted 6/20  for increasing dyspnea since being at [facility]. He was recently admitted 6/5 to 6/15 and underwent bronchoscopy with BAL on 6/7. BAL cultures are unrevealing to date with neutrophil predominance on BAL cell count differential.  Chest x-ray showed concern for diffuse bilateral interstitial pulmonary infiltrates with a left worse than the right, moderate left-sided pleural effusion, and small right sided pleural effusion.  PMH: never smoker with CAD, CVA, GERD, hiatal hernia, recurrent pneumonia and mild ILD features  Clinical Impression  Pt admitted with above diagnosis. Pt with orthostatic BP once sitting at EOB with symptoms. Had to lie down fairly quickly due to incr dizziness.  Nurse notified.  Able to get [facility] to 22 degrees with pt in bed.  Encouraging upright sitting as pt is able.   Pt currently with functional limitations due to the deficits listed below (see PT Problem List). Pt will benefit from skilled PT to increase their independence and safety with mobility to allow discharge to the venue listed below.                 Resting: HR 82 bpm, O2 92% 3L, BP 108/78; BP dropped to 74/64  with pt in sitting and 81/58 once laid back onto bed; 110/79 once back in bed. Left pt in bed with HOB at  22 degrees with BP 92/75. Recommendations for follow up therapy are one component of a multi-disciplinary discharge planning process, led by the attending physician.  Recommendations may be updated based on patient status, additional functional criteria and insurance authorization.  Follow Up Recommendations Skilled nursing-short term rehab (<3 hours/day)      Assistance Recommended at Discharge Intermittent Supervision/Assistance  Patient can return home with the following  Assistance with cooking/housework;Direct supervision/assist for  medications management;Direct supervision/assist for financial management;A lot of help with walking and/or transfers;Assist for transportation    Equipment Recommendations None recommended by PT  Recommendations for Other Services  OT consult    Functional Status Assessment Patient has had a recent decline in their functional status and demonstrates the ability to make significant improvements in function in a reasonable and predictable amount of time.     Precautions / Restrictions Precautions Precautions: Fall Precaution Comments: watch for orthostasis Restrictions Weight Bearing Restrictions: No      Mobility  Bed Mobility Overal bed mobility: Needs Assistance Bed Mobility: Supine to Sit     Supine to sit: Supervision, Min guard Sit to supine: Supervision   General bed mobility comments: no cues needed; guarding for safety as moving quickly.  Pt reports dizziness upon sitting.  Pt orthostatic and had to lay down fairly quickly after sitting up due to this.    Transfers                   General transfer comment: unable due to orthostasis    Ambulation/Gait               General Gait Details: unable due to dizziness  Stairs            Wheelchair Mobility    Modified Rankin (Stroke Patients Only)       Balance Overall balance assessment: Needs assistance Sitting-balance support: No upper extremity supported, Feet supported Sitting balance-Leahy Scale: Fair  Pertinent Vitals/Pain Pain Assessment Pain Assessment: 0-10 Pain Score: 7  Pain Location: chest pain from coughing Pain Descriptors / Indicators: Discomfort, Grimacing Pain Intervention(s): Limited activity within patient's tolerance, Monitored during session, Repositioned    Home Living Family/patient expects to be discharged to:: Skilled nursing facility Living Arrangements: Spouse/significant other Available Help at  Discharge: Available 24 hours/day;Family Type of Home: House Home Access: Level entry       Home Layout: One level Home Equipment: Shower seat;Shower seat - built in;Hand held Museum/gallery curator (4 wheels);Toilet riser;Wheelchair - Insurance risk surveyor Comments: added grab bar horziontal the length of the bathroom - home health OT requested and pt had it installed. pt has golden Pharmacist, hospital- [facility]    Prior Function Prior Level of Function : Needs assist;History of Falls (last six months)       Physical Assist : Mobility (physical);ADLs (physical) Mobility (physical): Gait ADLs (physical): Bathing;Dressing;IADLs Mobility Comments: rollator and WC at home due to feeling badly and low BP (on "bad" days uses wc) ADLs Comments: wife helps him into and out of shower on his good days, level of assist dependent on how pt is feeling, assisted for all IADLs     Hand Dominance   Dominant Hand: Right    Extremity/Trunk Assessment   Upper Extremity Assessment Upper Extremity Assessment: Defer to OT evaluation    Lower Extremity Assessment Lower Extremity Assessment: Generalized weakness    Cervical / Trunk Assessment Cervical / Trunk Assessment: Other exceptions Cervical / Trunk Exceptions: obesity  Communication   Communication: No difficulties  Cognition Arousal/Alertness: Awake/alert Behavior During Therapy: Flat affect Overall Cognitive Status: History of cognitive impairments - at baseline Area of Impairment: Problem solving, Attention, Safety/judgement                 Orientation Level: Place, Time, Disoriented to Current Attention Level: Selective Memory: Decreased short-term memory   Safety/Judgement: Decreased awareness of safety   Problem Solving: Slow processing, Difficulty sequencing, Requires verbal cues, Requires tactile cues          General Comments General comments (skin integrity, edema, etc.): 82 bpm, 92% 3L, 108/78;  74/64  sitting and 81/58 once laid back; 110/79 once back in bed. Left pt in bed with HOB at  22 degrees 92/75. Nurse made aware.    Exercises General Exercises - Lower Extremity Long Arc Quad: AROM, Both, 15 reps Hip ABduction/ADduction: AROM, Both, 15 reps, Seated Hip Flexion/Marching: AROM, Both, 15 reps, Seated Toe Raises: AROM, Both, 15 reps, Seated Heel Raises: AROM, Both, 15 reps, Seated   Assessment/Plan    PT Assessment Patient needs continued PT services  PT Problem List Decreased strength;Decreased activity tolerance;Decreased balance;Decreased mobility;Decreased cognition;Decreased knowledge of use of DME;Decreased safety awareness;Obesity       PT Treatment Interventions DME instruction;Gait training;Functional mobility training;Therapeutic activities;Therapeutic exercise;Balance training;Cognitive remediation;Patient/family education    PT Goals (Current goals can be found in the Care Plan section)  Acute Rehab PT Goals Patient Stated Goal: breathe better and go home PT Goal Formulation: With patient Time For Goal Achievement: 11/04/21 Potential to Achieve Goals: Good    Frequency Min 2X/week     Co-evaluation               AM-PAC PT "6 Clicks" Mobility  Outcome Measure Help needed turning from your back to your side while in a flat bed without using bedrails?: None Help needed moving from lying on your back to sitting on the side of a flat bed without  using bedrails?: A Little Help needed moving to and from a bed to a chair (including a wheelchair)?: A Little Help needed standing up from a chair using your arms (e.g., wheelchair or bedside chair)?: A Lot Help needed to walk in hospital room?: Total Help needed climbing 3-5 steps with a railing? : Total 6 Click Score: 14    End of Session Equipment Utilized During Treatment: Gait belt;Oxygen Activity Tolerance: Patient limited by fatigue Patient left: in chair;with call bell/phone within reach;with  family/visitor present Nurse Communication: Mobility status PT Visit Diagnosis: History of falling (Z91.81);Difficulty in walking, not elsewhere classified (R26.2);Dizziness and giddiness (R42)    Time: 1000-1025 PT Time Calculation (min) (ACUTE ONLY): 25 min   Charges:   PT Evaluation $PT Eval Moderate Complexity: 1 Mod PT Treatments $Therapeutic Activity: 8-22 mins        [facility] M,PT Acute Rehab Services [phone]   Alvira Philips 10/20/2021, 11:37 AM

## 2021-10-20 NOTE — Plan of Care (Signed)
  Problem: Coping: Goal: Level of anxiety will decrease Outcome: Progressing   Problem: Pain Managment: Goal: General experience of comfort will improve Outcome: Progressing   Problem: Safety: Goal: Ability to remain free from injury will improve Outcome: Progressing

## 2021-10-20 NOTE — Progress Notes (Signed)
PROGRESS NOTE    Mark Thomas  ESP:[id number] DOB: [birth year] DOA: 10/19/2021 PCP: Luetta Nutting, DO   Brief Narrative:  71 year old with history of CAD status post PCI, paroxysmal A-fib status post watchman's device not on anticoagulation, CVA, ASD status postclosure, vascular dementia, orthostatic hypotension on midodrine and Florinef, CKD stage IIIa admitted for shortness of breath.  Patient was here about 2 weeks ago admitted for hypoxia due to multifocal pneumonia underwent bronchoscopy and cultures were negative eventually treated with 10 days of cefepime and additional 5 days of oral Levaquin at discharge.  Modified barium swallow on 4/28 did not show any signs of aspiration.  Chest x-ray shows bilateral infiltrates, moderate left-sided pleural effusion and small on the right.   Assessment & Plan:  Principal Problem:   Recurrent pneumonia Active Problems:   Acute respiratory failure with hypoxia (HCC)   Pleural effusion   Vascular dementia with increased confusion/anger   Orthostatic hypotension   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, likely ILD    Paroxysmal atrial fibrillation (HCC) s/p Watchman device   CAD (coronary artery disease)   Anemia   Gastroesophageal reflux disease without esophagitis   History of CVA (cerebrovascular accident)   Hypoalbuminemia   Leukocytosis    Acute respiratory failure with hypoxia secondary to recurrent pneumonia with atelectasis -Bedside ultrasound showed atelectasis.  No evidence of pleural effusion. -Concerns of recurrent pneumonia versus previously unresolved pneumonia. - Procalcitonin-negative - BNP-165 - Bronchodilators.  I-S/flutter valve - Supplemental oxygen as needed. - Seen by pulmonary.  Attempted bedside thoracentesis but on ultrasound there was no fluid collection noted. -Repeat Fungitell- pending, LDH 213 - On prednisone and IV vancomycin/Cefepime. Low threshold to Stop.    Chronic heart failure with preserved EF EF  70%, grade 2 DD -Secondary to pleural effusion.  Echocardiogram 08/27/21 showed EF of 70% with grade 2 DD. - Lasix 40 mg IV as blood pressure tolerates it   Leukocytosis Improving.   History of interstitial lung disease -Suspect secondary to GERD.  PPI.   Elevated troponin -Suspect demand ischemia.   Orthostatic hypotension -Patient is on midodrine; will increase to 54m tid.  - She is also on metoprolol for A-fib. -Florinef had previously been stopped -PT/OT  Sick sinus syndrome s/p PPM SVT history of atrial fibrillation s/p ablation and Watchman device -Seen by cardiology in the past.  No anticoagulation - Continue metoprolol   Macrocytic anemia -Chronic.  Monitor hemoglobin. B12 folate   Coronary artery disease -On aspirin, Plavix, statin.  Currently chest pain-free. - Continue Ranexa   History of CVA No residual deficits reported. -On aspirin, Plavix and statin   Hypoalbuminemia -Albumin admission 2.8   Vascular dementia -Supportive care.  On Namenda and Seroquel   Anxiety and depression Restless leg syndrome -Continue Lexapro.  Ropinirole   GERD -On PPI   Obesity BMI 34.03 kg/m2    DVT prophylaxis: Lovenox Code Status: Full Code Family Communication:  Called Mrs HMalay   Status is: Inpatient Remains inpatient appropriate because: Still quite hypoxic and off and on getting hypotensive. Not stable for discharge.     Subjective: Feels ok at bed, but when he got up with OT. He became hypotensive.  Gets hypoxic with mobility as well.   Examination:  General exam:  5L [place]. Chronically ill.  Respiratory system: b/l diminished BS up midway up his lung fields Cardiovascular system: S1 & S2 heard, RRR. No JVD, murmurs, rubs, gallops or clicks. No pedal edema. Gastrointestinal system: Abdomen is nondistended, soft and nontender.  No organomegaly or masses felt. Normal bowel sounds heard. Central nervous system: Alert and oriented. No focal neurological  deficits. Extremities: Symmetric 5 x 5 power. Skin: No rashes, lesions or ulcers Psychiatry: Judgement and insight appear normal. Mood & affect appropriate.     Objective: Vitals:   10/20/21 0200 10/20/21 0227 10/20/21 0300 10/20/21 0727  BP: 107/83 128/86 (!) 163/102 (!) 142/95  Pulse: 100 92 98 84  Resp: _0 Temp:  98.3 F (36.8 C) 98 F (36.7 C) 98 F (36.7 C)  TempSrc:  Oral Oral Oral  SpO2: 95% 97% 93% 97%  Weight:   106.2 kg   Height:   6' 1" (1.854 m)     Intake/Output Summary (Last 24 hours) at 10/20/2021 0747 Last data filed at 10/20/2021 0319 Gross per 24 hour  Intake 440.14 ml  Output 1350 ml  Net -909.86 ml   Filed Weights   10/19/21 1018 10/20/21 0300  Weight: 117 kg 106.2 kg     Data Reviewed:   CBC: Recent Labs  Lab 10/19/21 1200 10/19/21 1228 10/20/21 0456  WBC 15.9*  --  12.0*  NEUTROABS 13.4*  --   --   HGB 11.0* 11.6* 10.5*  HCT 34.8* 34.0* 33.6*  MCV 101.8*  --  104.0*  PLT 204  --  322   Basic Metabolic Panel: Recent Labs  Lab 10/19/21 1200 10/19/21 1228 10/20/21 0456  NA 140 139 142  K 3.9 3.9 4.0  CL 100  --  104  CO2 29  --  27  GLUCOSE 119*  --  97  BUN 19  --  22  CREATININE 1.08  --  1.08  CALCIUM 9.4  --  8.9   GFR: Estimated Creatinine Clearance: 83.7 mL/min (by C-G formula based on SCr of 1.08 mg/dL). Liver Function Tests: Recent Labs  Lab 10/19/21 1200  AST 36  ALT 41  ALKPHOS 76  BILITOT 0.8  PROT 7.1  ALBUMIN 2.8*   No results for input(s): "LIPASE", "AMYLASE" in the last 168 hours. No results for input(s): "AMMONIA" in the last 168 hours. Coagulation Profile: No results for input(s): "INR", "PROTIME" in the last 168 hours. Cardiac Enzymes: No results for input(s): "CKTOTAL", "CKMB", "CKMBINDEX", "TROPONINI" in the last 168 hours. BNP (last 3 results) Recent Labs    03/18/21 1004  PROBNP 370   HbA1C: No results for input(s): "HGBA1C" in the last 72 hours. CBG: No results for input(s):  "GLUCAP" in the last 168 hours. Lipid Profile: No results for input(s): "CHOL", "HDL", "LDLCALC", "TRIG", "CHOLHDL", "LDLDIRECT" in the last 72 hours. Thyroid Function Tests: No results for input(s): "TSH", "T4TOTAL", "FREET4", "T3FREE", "THYROIDAB" in the last 72 hours. Anemia Panel: No results for input(s): "VITAMINB12", "FOLATE", "FERRITIN", "TIBC", "IRON", "RETICCTPCT" in the last 72 hours. Sepsis Labs: Recent Labs  Lab 10/19/21 1200 10/20/21 0442  PROCALCITON  --  <0.10  LATICACIDVEN 1.3 1.0    Recent Results (from the past 240 hour(s))  Respiratory (~20 pathogens) panel by PCR     Status: None   Collection Time: 10/19/21 10:33 PM   Specimen: Urine, Clean Catch; Respiratory  Result Value Ref Range Status   Adenovirus NOT DETECTED NOT DETECTED Final   Coronavirus 229E NOT DETECTED NOT DETECTED Final    Comment: (NOTE) The Coronavirus on the Respiratory Panel, DOES NOT test for the novel  Coronavirus (2019 nCoV)    Coronavirus HKU1 NOT DETECTED NOT DETECTED Final   Coronavirus NL63 NOT DETECTED NOT DETECTED  Final   Coronavirus OC43 NOT DETECTED NOT DETECTED Final   Metapneumovirus NOT DETECTED NOT DETECTED Final   Rhinovirus / Enterovirus NOT DETECTED NOT DETECTED Final   Influenza A NOT DETECTED NOT DETECTED Final   Influenza B NOT DETECTED NOT DETECTED Final   Parainfluenza Virus 1 NOT DETECTED NOT DETECTED Final   Parainfluenza Virus 2 NOT DETECTED NOT DETECTED Final   Parainfluenza Virus 3 NOT DETECTED NOT DETECTED Final   Parainfluenza Virus 4 NOT DETECTED NOT DETECTED Final   Respiratory Syncytial Virus NOT DETECTED NOT DETECTED Final   Bordetella pertussis NOT DETECTED NOT DETECTED Final   Bordetella Parapertussis NOT DETECTED NOT DETECTED Final   Chlamydophila pneumoniae NOT DETECTED NOT DETECTED Final   Mycoplasma pneumoniae NOT DETECTED NOT DETECTED Final    Comment: Performed at [facility], [place] [address], [place], [place] [address]  MRSA Next Gen by  PCR, Nasal     Status: None   Collection Time: 10/20/21  2:59 AM   Specimen: Nasal Mucosa; Nasal Swab  Result Value Ref Range Status   MRSA by PCR Next Gen NOT DETECTED NOT DETECTED Final    Comment: (NOTE) The GeneXpert MRSA Assay (FDA approved for NASAL specimens only), is one component of a comprehensive MRSA colonization surveillance program. It is not intended to diagnose MRSA infection nor to guide or monitor treatment for MRSA infections. Test performance is not FDA approved in patients less than 17 years old. Performed at [facility], [place] [address], [place], [place] [address]          Radiology Studies: DG Abd 1 View  Result Date: 10/19/2021 CLINICAL DATA:  [phone]. Encounter for Abdominal distension, shortness of breath EXAM: ABDOMEN - 1 VIEW COMPARISON:  None Available. FINDINGS: Atherosclerotic plaque. The bowel gas pattern is normal. No radio-opaque calculi or other significant radiographic abnormality are seen. Two lead cardiac pacemaker partially visualized. Coronary artery stents. IMPRESSION: 1. Nonobstructive bowel gas pattern. 2. Aortic Atherosclerosis (ICD10-I70.0). 3. Please see separately dictated chest x-ray 10/19/2021. Electronically Signed   By: Iven Finn M.D.   On: 10/19/2021 19:52   DG Chest 2 View  Result Date: 10/19/2021 CLINICAL DATA:  PNA, worsening SOB EXAM: CHEST - 2 VIEW COMPARISON:  Radiograph 10/12/2021 FINDINGS: Unchanged dual chamber pacemaker leads. Unchanged cardiomediastinal silhouette. Persistent bilateral interstitial and airspace disease, worsening in the right mid and medial basilar lung. Stable small bilateral pleural effusions. No pneumothorax. No acute osseous abnormality. IMPRESSION: Persistent multifocal pneumonia with increasing airspace disease in the right mid and medial basilar lung. Stable bilateral pleural effusions. Electronically Signed   By: Maurine Simmering M.D.   On: 10/19/2021 11:18        Scheduled Meds:   allopurinol  100 mg Oral Daily   arformoterol  15 mcg Nebulization BID   aspirin EC  81 mg Oral Daily   atorvastatin  80 mg Oral Daily   budesonide (PULMICORT) nebulizer solution  0.25 mg Nebulization BID   clopidogrel  75 mg Oral Daily   enoxaparin (LOVENOX) injection  55 mg Subcutaneous Q24H   escitalopram  30 mg Oral Daily   furosemide  40 mg Intravenous Daily   guaiFENesin  600 mg Oral BID   ipratropium-albuterol  3 mL Nebulization Q8H   magnesium oxide  400 mg Oral Daily   memantine  10 mg Oral BID   metoprolol tartrate  37.5 mg Oral BID   midodrine  2.5 mg Oral TID WC   pantoprazole  40  mg Oral QPM   polyethylene glycol  17 g Oral BID   predniSONE  40 mg Oral Q breakfast   primidone  100 mg Oral BID   QUEtiapine  100 mg Oral QHS   ranolazine  1,000 mg Oral BID   rOPINIRole  4 mg Oral QHS   sodium chloride flush  3 mL Intravenous Q12H   Continuous Infusions:  vancomycin Stopped (10/20/21 0319)     LOS: 1 day   Time spent= 35 mins    Patsy Varma Arsenio Loader, MD [facility]  If 7PM-7AM, please contact night-coverage  10/20/2021, 7:47 AM

## 2021-10-21 ENCOUNTER — Inpatient Hospital Stay: Payer: Medicare Other | Admitting: Pulmonary Disease

## 2021-10-21 ENCOUNTER — Inpatient Hospital Stay (HOSPITAL_COMMUNITY): Payer: Medicare Other

## 2021-10-21 DIAGNOSIS — J189 Pneumonia, unspecified organism: Secondary | ICD-10-CM | POA: Diagnosis not present

## 2021-10-21 DIAGNOSIS — J9601 Acute respiratory failure with hypoxia: Secondary | ICD-10-CM | POA: Diagnosis not present

## 2021-10-21 LAB — BASIC METABOLIC PANEL
Anion gap: 12 (ref 5–15)
BUN: 26 mg/dL — ABNORMAL HIGH (ref 8–23)
CO2: 26 mmol/L (ref 22–32)
Calcium: 9 mg/dL (ref 8.9–10.3)
Chloride: 101 mmol/L (ref 98–111)
Creatinine, Ser: 1.4 mg/dL — ABNORMAL HIGH (ref 0.61–1.24)
GFR, Estimated: 55 mL/min — ABNORMAL LOW (ref >60)
Glucose, Bld: 150 mg/dL — ABNORMAL HIGH (ref 70–99)
Potassium: 4.6 mmol/L (ref 3.5–5.1)
Sodium: 139 mmol/L (ref 135–145)

## 2021-10-21 LAB — CBC
HCT: 33.3 % — ABNORMAL LOW (ref 39.0–52.0)
Hemoglobin: 10.4 g/dL — ABNORMAL LOW (ref 13.0–17.0)
MCH: 32 pg (ref 26.0–34.0)
MCHC: 31.2 g/dL (ref 30.0–36.0)
MCV: 102.5 fL — ABNORMAL HIGH (ref 80.0–100.0)
Platelets: 217 10*3/uL (ref 150–400)
RBC: 3.25 MIL/uL — ABNORMAL LOW (ref 4.22–5.81)
RDW: 15.4 % (ref 11.5–15.5)
WBC: 12.2 10*3/uL — ABNORMAL HIGH (ref 4.0–10.5)
nRBC: 0 % (ref 0.0–0.2)

## 2021-10-21 LAB — FOLATE: Folate: 15.6 ng/mL (ref >5.9)

## 2021-10-21 LAB — MAGNESIUM: Magnesium: 1.8 mg/dL (ref 1.7–2.4)

## 2021-10-21 IMAGING — DX DG CHEST 1V PORT
1 series · 1 of 1 positions shown · non-contrast
Comparison: [DATE]

CLINICAL DATA: Shortness of breath, post bronchoscopy

EXAM:
PORTABLE CHEST 1 VIEW

[chest ap]
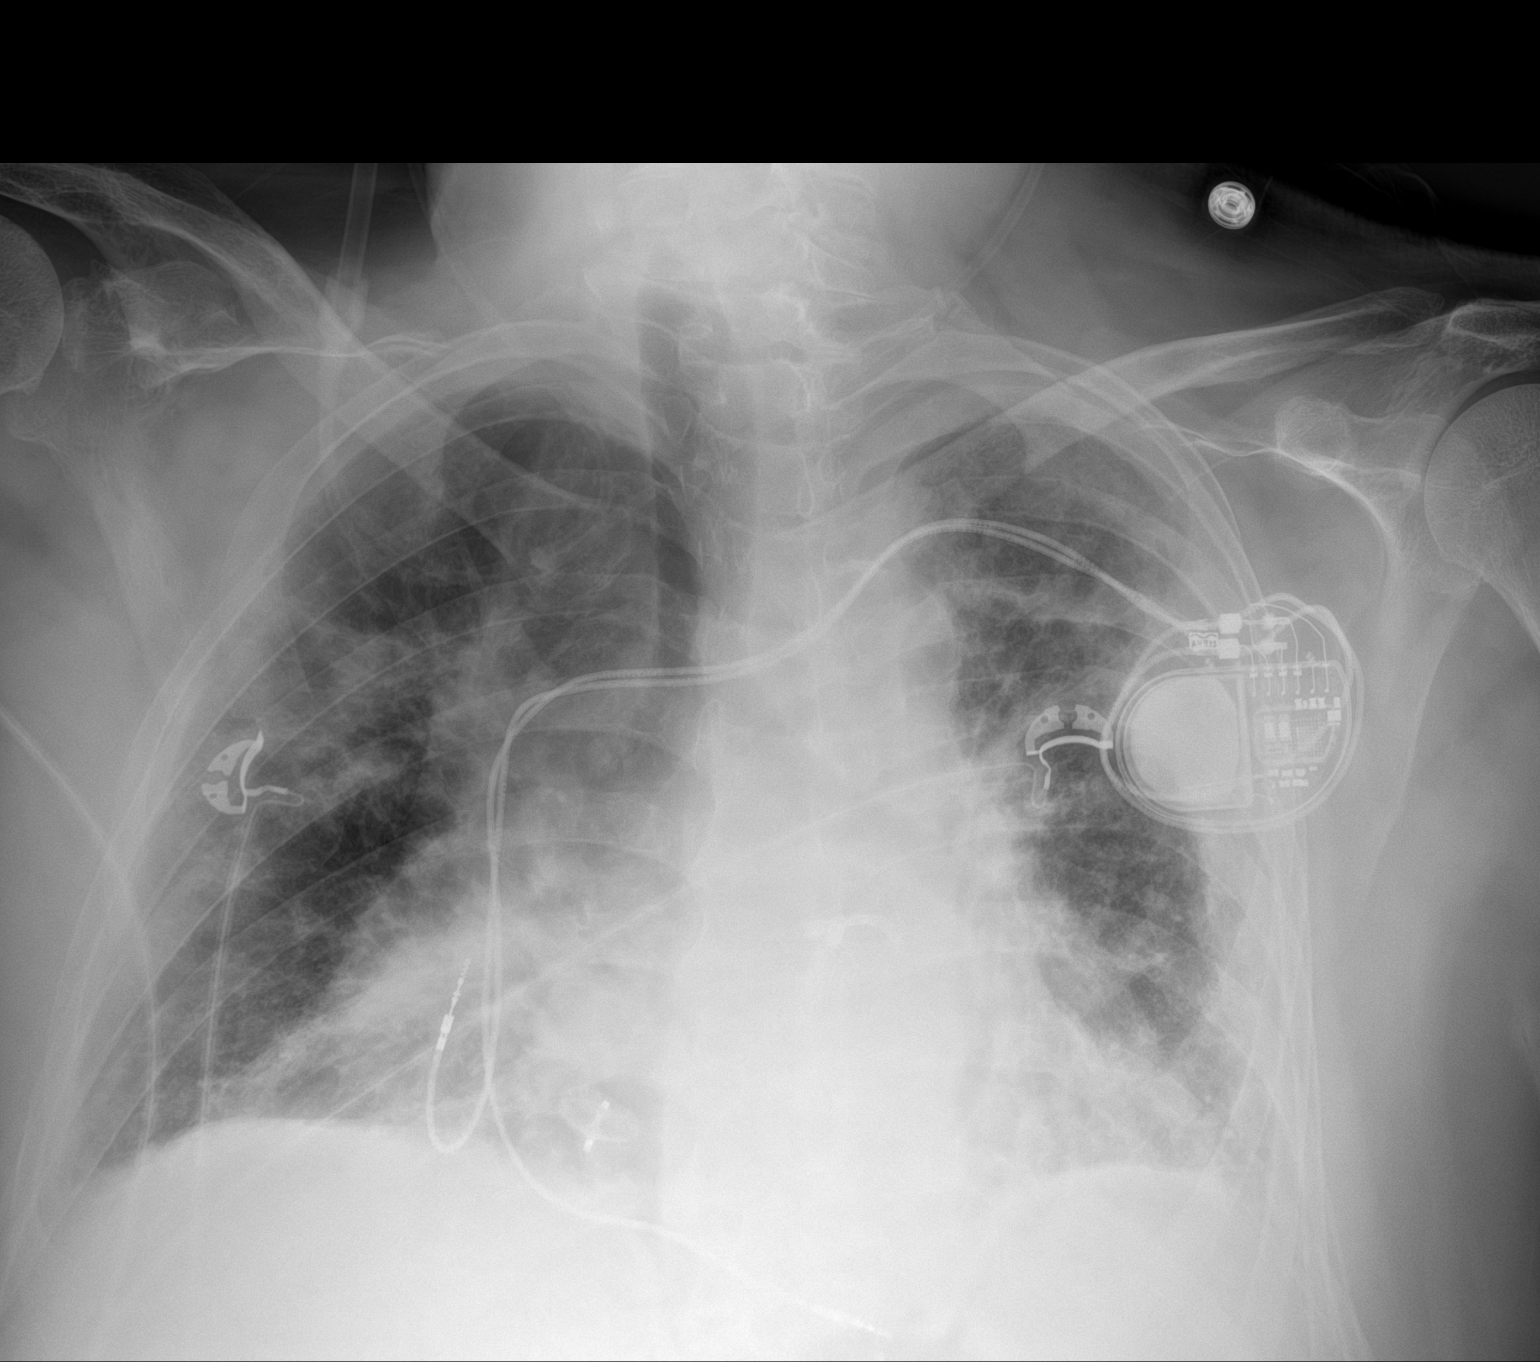

[1 of 1 positions shown; findings below may reference images not displayed]

FINDINGS: Rotated AP portable examination. Cardiomegaly. Left chest multi lead
pacer. Bilateral heterogeneous airspace opacity, not significantly
changed. No new airspace opacity.
IMPRESSION: 1. Rotated AP portable examination. Bilateral heterogeneous airspace
opacity, not significantly changed. No new airspace opacity.
2. Cardiomegaly.

## 2021-10-21 MED ORDER — FUROSEMIDE 10 MG/ML IJ SOLN: 40.0000 mg | Freq: Every day | INTRAMUSCULAR | Status: DC

## 2021-10-21 MED ORDER — LATANOPROST 0.005 % OP SOLN
1.0000 [drp] | Freq: Every day | OPHTHALMIC | Status: DC
  Administered 2021-10-21 – 2021-10-25 (×5): 1 [drp] via OPHTHALMIC
  Filled 2021-10-21: qty 2.5

## 2021-10-21 NOTE — Progress Notes (Signed)
PROGRESS NOTE    Mark Thomas  ZOX:[id number] DOB: 1950-07-11 DOA: 10/19/2021 PCP: Luetta Nutting, DO   Brief Narrative:  71 year old with history of CAD status post PCI, paroxysmal A-fib status post watchman's device not on anticoagulation, CVA, ASD status postclosure, vascular dementia, orthostatic hypotension on midodrine and Florinef, CKD stage IIIa admitted for shortness of breath.  Patient was here about 2 weeks ago admitted for hypoxia due to multifocal pneumonia underwent bronchoscopy and cultures were negative eventually treated with 10 days of cefepime and additional 5 days of oral Levaquin at discharge.  Modified barium swallow on 4/28 did not show any signs of aspiration.  Chest x-ray shows bilateral infiltrates, moderate left-sided pleural effusion and small on the right.  Bedside ultrasound revealed atelectasis therefore ongoing diuresis, steroids and antibiotics.   Assessment & Plan:  Principal Problem:   Recurrent pneumonia Active Problems:   Acute respiratory failure with hypoxia (HCC)   Pleural effusion   Vascular dementia with increased confusion/anger   Orthostatic hypotension   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, likely ILD    Paroxysmal atrial fibrillation (HCC) s/p Watchman device   CAD (coronary artery disease)   CKD (chronic kidney disease) stage 3, GFR 30-59 ml/min (HCC)   Anemia   Gastroesophageal reflux disease without esophagitis   Major depressive disorder, single episode, severe, 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[place])   Pacemaker   GAD (generalized anxiety disorder)   History of CVA (cerebrovascular accident)   Hypoalbuminemia   Leukocytosis    Acute respiratory failure with hypoxia secondary to recurrent pneumonia with atelectasis -Bedside ultrasound showed atelectasis.  No evidence of pleural effusion..  This morning 3 L nasal cannula. -Concerns of recurrent pneumonia versus previously unresolved pneumonia. - Procalcitonin negative, BNP 165 -  Bronchodilators.  I-S/flutter valve.  O2 as needed.  Out of bed to chair -Pulmonary following.  Bedside ultrasound-shows atelectasis -Repeat Fungitell- pending, LDH 213 - On prednisone and IV vancomycin/Cefepime. Low threshold to Stop.    Acute on chronic heart failure with preserved EF EF 70%, grade 2 DD -Secondary to pleural effusion.  Echocardiogram 08/27/21 showed EF of 70% with grade 2 DD.  Stop Lasix due to bump in creatinine  Acute kidney injury - Creatinine baseline 1.0.  Good response to Lasix but creatinine bumped to 1.4 therefore we will stop   Leukocytosis -Stable.  On steroids   History of interstitial lung disease -Suspect secondary to GERD.  PPI.   Elevated troponin -Suspect demand ischemia.   Orthostatic hypotension - Midodrine increased to 5 mg 3 times daily - She is also on metoprolol for A-fib. -Florinef had previously been stopped -PT/OT-SNF  Sick sinus syndrome s/p PPM SVT history of atrial fibrillation s/p ablation and Watchman device -Seen by cardiology in the past.  No anticoagulation - Continue metoprolol   Macrocytic anemia -Chronic.  Monitor hemoglobin. B12 folate-normal   Coronary artery disease -On aspirin, Plavix, statin.  Currently chest pain-free. - Continue Ranexa   History of CVA No residual deficits reported. -On aspirin, Plavix and statin   Hypoalbuminemia -Albumin admission 2.8   Vascular dementia -Supportive care.  On Namenda and Seroquel   Anxiety and depression Restless leg syndrome -Continue Lexapro.  Ropinirole   GERD -On PPI   Obesity BMI 34.03 kg/m2    DVT prophylaxis: Lovenox Code Status: Full Code Family Communication:  Called Mrs Knotek periodicly  Status is: Inpatient Remains inpatient appropriate because: Still quite hypoxic and off and on getting hypotensive. Not stable for discharge.  Subjective: Seen and examined at bedside, better efforts on incentive spirometer is able to bring up to 1000  cc.  Remains afebrile.  Symptomatically tells me his shortness of breath is slightly improved.  He still orthostatic with drop in blood pressure into 70s when he sits up  Examination: Constitutional: Not in acute distress.  4 L nasal cannula.  Appears chronically ill. Respiratory: Bibasilar crackles Cardiovascular: Normal sinus rhythm, no rubs Abdomen: Nontender nondistended good bowel sounds Musculoskeletal: No edema noted Skin: No rashes seen Neurologic: CN 2-12 grossly intact.  And nonfocal Psychiatric: Normal judgment and insight. Alert and oriented x 3. Normal mood.  Objective: Vitals:   10/20/21 1900 10/20/21 1945 10/20/21 2300 10/21/21 0331  BP: 115/75  116/73 123/85  Pulse: 87 86 82 96  Resp: (!) _0 Temp: 98.6 F (37 C)  98.4 F (36.9 C) 97.8 F (36.6 C)  TempSrc: Oral  Oral Oral  SpO2: 93% 97% 96% 95%  Weight:      Height:        Intake/Output Summary (Last 24 hours) at 10/21/2021 0719 Last data filed at 10/21/2021 0335 Gross per 24 hour  Intake 360 ml  Output 2350 ml  Net -1990 ml   Filed Weights   10/19/21 1018 10/20/21 0300  Weight: 117 kg 106.2 kg     Data Reviewed:   CBC: Recent Labs  Lab 10/19/21 1200 10/19/21 1228 10/20/21 0456 10/21/21 0033  WBC 15.9*  --  12.0* 12.2*  NEUTROABS 13.4*  --   --   --   HGB 11.0* 11.6* 10.5* 10.4*  HCT 34.8* 34.0* 33.6* 33.3*  MCV 101.8*  --  104.0* 102.5*  PLT 204  --  187 384   Basic Metabolic Panel: Recent Labs  Lab 10/19/21 1200 10/19/21 1228 10/20/21 0456 10/21/21 0033  NA 140 139 142 139  K 3.9 3.9 4.0 4.6  CL 100  --  104 101  CO2 29  --  27 26  GLUCOSE 119*  --  97 150*  BUN 19  --  22 26*  CREATININE 1.08  --  1.08 1.40*  CALCIUM 9.4  --  8.9 9.0  MG  --   --   --  1.8   GFR: Estimated Creatinine Clearance: 64.6 mL/min (A) (by C-G formula based on SCr of 1.4 mg/dL (H)). Liver Function Tests: Recent Labs  Lab 10/19/21 1200  AST 36  ALT 41  ALKPHOS 76  BILITOT 0.8  PROT  7.1  ALBUMIN 2.8*   No results for input(s): "LIPASE", "AMYLASE" in the last 168 hours. No results for input(s): "AMMONIA" in the last 168 hours. Coagulation Profile: No results for input(s): "INR", "PROTIME" in the last 168 hours. Cardiac Enzymes: No results for input(s): "CKTOTAL", "CKMB", "CKMBINDEX", "TROPONINI" in the last 168 hours. BNP (last 3 results) Recent Labs    03/18/21 1004  PROBNP 370   HbA1C: No results for input(s): "HGBA1C" in the last 72 hours. CBG: No results for input(s): "GLUCAP" in the last 168 hours. Lipid Profile: No results for input(s): "CHOL", "HDL", "LDLCALC", "TRIG", "CHOLHDL", "LDLDIRECT" in the last 72 hours. Thyroid Function Tests: Recent Labs    10/20/21 0456  TSH 3.377   Anemia Panel: Recent Labs    10/20/21 0456 10/21/21 0033  VITAMINB12 548  --   FOLATE  --  15.6   Sepsis Labs: Recent Labs  Lab 10/19/21 1200 10/20/21 0442  PROCALCITON  --  <0.10  LATICACIDVEN 1.3 1.0  Recent Results (from the past 240 hour(s))  Respiratory (~20 pathogens) panel by PCR     Status: None   Collection Time: 10/19/21 10:33 PM   Specimen: Urine, Clean Catch; Respiratory  Result Value Ref Range Status   Adenovirus NOT DETECTED NOT DETECTED Final   Coronavirus 229E NOT DETECTED NOT DETECTED Final    Comment: (NOTE) The Coronavirus on the Respiratory Panel, DOES NOT test for the novel  Coronavirus (2019 nCoV)    Coronavirus HKU1 NOT DETECTED NOT DETECTED Final   Coronavirus NL63 NOT DETECTED NOT DETECTED Final   Coronavirus OC43 NOT DETECTED NOT DETECTED Final   Metapneumovirus NOT DETECTED NOT DETECTED Final   Rhinovirus / Enterovirus NOT DETECTED NOT DETECTED Final   Influenza A NOT DETECTED NOT DETECTED Final   Influenza B NOT DETECTED NOT DETECTED Final   Parainfluenza Virus 1 NOT DETECTED NOT DETECTED Final   Parainfluenza Virus 2 NOT DETECTED NOT DETECTED Final   Parainfluenza Virus 3 NOT DETECTED NOT DETECTED Final   Parainfluenza  Virus 4 NOT DETECTED NOT DETECTED Final   Respiratory Syncytial Virus NOT DETECTED NOT DETECTED Final   Bordetella pertussis NOT DETECTED NOT DETECTED Final   Bordetella Parapertussis NOT DETECTED NOT DETECTED Final   Chlamydophila pneumoniae NOT DETECTED NOT DETECTED Final   Mycoplasma pneumoniae NOT DETECTED NOT DETECTED Final    Comment: Performed at [facility], [place]. [address] [place], [place] [address]  MRSA Next Gen by PCR, Nasal     Status: None   Collection Time: 10/20/21  2:59 AM   Specimen: Nasal Mucosa; Nasal Swab  Result Value Ref Range Status   MRSA by PCR Next Gen NOT DETECTED NOT DETECTED Final    Comment: (NOTE) The GeneXpert MRSA Assay (FDA approved for NASAL specimens only), is one component of a comprehensive MRSA colonization surveillance program. It is not intended to diagnose MRSA infection nor to guide or monitor treatment for MRSA infections. Test performance is not FDA approved in patients less than 36 years old. Performed at [facility], [place] [address], [place], [place] [address]          Radiology Studies: DG Abd 1 View  Result Date: 10/19/2021 CLINICAL DATA:  [phone]. Encounter for Abdominal distension, shortness of breath EXAM: ABDOMEN - 1 VIEW COMPARISON:  None Available. FINDINGS: Atherosclerotic plaque. The bowel gas pattern is normal. No radio-opaque calculi or other significant radiographic abnormality are seen. Two lead cardiac pacemaker partially visualized. Coronary artery stents. IMPRESSION: 1. Nonobstructive bowel gas pattern. 2. Aortic Atherosclerosis (ICD10-I70.0). 3. Please see separately dictated chest x-ray 10/19/2021. Electronically Signed   By: Iven Finn M.D.   On: 10/19/2021 19:52   DG Chest 2 View  Result Date: 10/19/2021 CLINICAL DATA:  PNA, worsening SOB EXAM: CHEST - 2 VIEW COMPARISON:  Radiograph 10/12/2021 FINDINGS: Unchanged dual chamber pacemaker leads. Unchanged cardiomediastinal silhouette. Persistent  bilateral interstitial and airspace disease, worsening in the right mid and medial basilar lung. Stable small bilateral pleural effusions. No pneumothorax. No acute osseous abnormality. IMPRESSION: Persistent multifocal pneumonia with increasing airspace disease in the right mid and medial basilar lung. Stable bilateral pleural effusions. Electronically Signed   By: Maurine Simmering M.D.   On: 10/19/2021 11:18        Scheduled Meds:  allopurinol  100 mg Oral Daily   arformoterol  15 mcg Nebulization BID   aspirin EC  81 mg Oral Daily   atorvastatin  80 mg Oral Daily   budesonide (PULMICORT) nebulizer  solution  0.25 mg Nebulization BID   clopidogrel  75 mg Oral Daily   enoxaparin (LOVENOX) injection  55 mg Subcutaneous Q24H   escitalopram  30 mg Oral Daily   furosemide  60 mg Intravenous Q12H   guaiFENesin  600 mg Oral BID   ipratropium-albuterol  3 mL Nebulization TID   magnesium oxide  400 mg Oral Daily   memantine  10 mg Oral BID   metoprolol tartrate  37.5 mg Oral BID   midodrine  5 mg Oral TID WC   pantoprazole  40 mg Oral QPM   polyethylene glycol  17 g Oral BID   predniSONE  40 mg Oral Q breakfast   primidone  100 mg Oral BID   QUEtiapine  100 mg Oral QHS   ranolazine  1,000 mg Oral BID   rOPINIRole  4 mg Oral QHS   sodium chloride flush  3 mL Intravenous Q12H   Continuous Infusions:  ceFEPime (MAXIPIME) IV 2 g (10/21/21 0649)   vancomycin 1,000 mg (10/20/21 1025)     LOS: 2 days   Time spent= 35 mins    Ravin Denardo Arsenio Loader, MD [facility]  If 7PM-7AM, please contact night-coverage  10/21/2021, 7:19 AM

## 2021-10-21 NOTE — Evaluation (Signed)
Occupational Therapy Evaluation Patient Details Name: Mark Thomas MRN: [id number] DOB: Mar 13, 1951 Today's Date: 10/21/2021   History of Present Illness 58M admitted 6/20  for increasing dyspnea since being at [facility]. He was recently admitted 6/5 to 6/15 and underwent bronchoscopy with BAL on 6/7. BAL cultures are unrevealing to date with neutrophil predominance on BAL cell count differential.  Chest x-ray showed concern for diffuse bilateral interstitial pulmonary infiltrates with a left worse than the right, moderate left-sided pleural effusion, and small right sided pleural effusion.  PMH: never smoker with CAD, CVA, GERD, hiatal hernia, recurrent pneumonia and mild ILD features   Clinical Impression   Pt admitted with the above diagnoses and presents with below problem list. Pt will benefit from continued acute OT to address the below listed deficits and maximize independence with basic ADLs prior to d/c to venue below. At baseline, pt endorses needing some assist with shower transfers and bathing; rollator vs w/c for mobilizing depending on how he is feeling that day. Bed level eval as pt consistently refused EOB/OOB activity. Discussed the benefit of getting up in the recliner today and risks of immobility. Also discussed strategies for transferring (squat pivot if needed). Pt may be more agreeable to getting up to the chair if able to time this discussion with presence of family or familiar faces, noted h/o cognitive impairment. Educated on bed level exercises. Of note, Pt on 3L O2 with SpO2 noted to drop to 78 during BLE strength testing, recovered with rest.        Recommendations for follow up therapy are one component of a multi-disciplinary discharge planning process, led by the attending physician.  Recommendations may be updated based on patient status, additional functional criteria and insurance authorization.   Follow Up Recommendations  Skilled nursing-short term rehab (<3  hours/day)    Assistance Recommended at Discharge Frequent or constant Supervision/Assistance  Patient can return home with the following A little help with walking and/or transfers;A lot of help with bathing/dressing/bathroom;Assistance with cooking/housework;Direct supervision/assist for medications management;Direct supervision/assist for financial management;Assist for transportation;Help with stairs or ramp for entrance    Functional Status Assessment  Patient has had a recent decline in their functional status and demonstrates the ability to make significant improvements in function in a reasonable and predictable amount of time.  Equipment Recommendations  None recommended by OT    Recommendations for Other Services       Precautions / Restrictions Precautions Precautions: Fall Precaution Comments: watch for orthostasis Restrictions Weight Bearing Restrictions: No      Mobility Bed Mobility Overal bed mobility: Needs Assistance Bed Mobility: Supine to Sit     Supine to sit: Supervision, Min guard Sit to supine: Supervision   General bed mobility comments: no cues needed; guarding for safety as moving quickly.  Pt reports dizziness upon sitting.  Pt orthostatic and had to lay down fairly quickly after sitting up due to this.    Transfers Overall transfer level: Needs assistance Equipment used: Rolling walker (2 wheels) Transfers: Sit to/from Stand Sit to Stand: Min assist     Step pivot transfers: Min guard    Lateral/Scoot Transfers: Min assist, +2 safety/equipment General transfer comment: unable due to orthostasis      Balance Overall balance assessment: Needs assistance Sitting-balance support: No upper extremity supported, Feet supported Sitting balance-Leahy Scale: Fair     Standing balance support: Bilateral upper extremity supported, During functional activity, Reliant on assistive device for balance Standing balance-Leahy Scale: Poor Standing  balance comment: needs BUE support or external assist with U UE support when checking BP                           ADL either performed or assessed with clinical judgement   ADL Overall ADL's : Needs assistance/impaired Eating/Feeding: Bed level;Set up   Grooming: Minimal assistance;Bed level   Upper Body Bathing: Moderate assistance;Bed level   Lower Body Bathing: Moderate assistance;Bed level   Upper Body Dressing : Moderate assistance;Bed level   Lower Body Dressing: Moderate assistance;Bed level                 General ADL Comments: Pt declined EOB d/t dizziness. Discussed benefit of trying to get into chair and strategies for making that doable (squat pivot, recline once in chair, etc). Encouraged pt to get OOB to chair today.     Vision Baseline Vision/History: 1 Wears glasses       Perception     Praxis      Pertinent Vitals/Pain Pain Assessment Pain Assessment: Faces Faces Pain Scale: Hurts little more Pain Location: "my lungs hurt, hurts like fire" Pain Descriptors / Indicators: Discomfort, Grimacing Pain Intervention(s): Limited activity within patient's tolerance, Monitored during session, Repositioned     Hand Dominance Right   Extremity/Trunk Assessment Upper Extremity Assessment Upper Extremity Assessment: RUE deficits/detail;Generalized weakness RUE Deficits / Details: h/o rotator cuff injury pt reports he has been told he needs surgery to shoulder. AROM shoulder limited by pain but able to use functionally.   Lower Extremity Assessment Lower Extremity Assessment: Defer to PT evaluation   Cervical / Trunk Assessment Cervical / Trunk Assessment: Other exceptions   Communication Communication Communication: No difficulties   Cognition Arousal/Alertness: Awake/alert Behavior During Therapy: Flat affect Overall Cognitive Status: History of cognitive impairments - at baseline                                 General  Comments: decreased problem solving.     General Comments  Pt on 3L O2 with SpO2 noted to drop to 78 during BLE strength testing.    Exercises Exercises: General Lower Extremity   Shoulder Instructions      Home Living Family/patient expects to be discharged to:: Skilled nursing facility Living Arrangements: Spouse/significant other Available Help at Discharge: Available 24 hours/day;Family Type of Home: House Home Access: Level entry     Home Layout: One level     Bathroom Shower/Tub: Occupational psychologist: Handicapped height Bathroom Accessibility: Yes   Home Equipment: Shower seat;Shower seat - built in;Hand held Museum/gallery curator (4 wheels);Toilet riser;Wheelchair - Transport planner Comments: added grab bar horziontal the length of the bathroom - home health OT requested and pt had it installed. pt has golden Pharmacist, hospital- [place]      Prior Functioning/Environment Prior Level of Function : Needs assist;History of Falls (last six months)       Physical Assist : Mobility (physical);ADLs (physical) Mobility (physical): Gait ADLs (physical): Bathing;Dressing;IADLs Mobility Comments: rollator and WC at home due to feeling badly and low BP (on "bad" days uses wc) ADLs Comments: wife helps him into and out of shower on his good days, level of assist dependent on how pt is feeling, assisted for all IADLs        OT Problem List: Decreased strength;Decreased activity tolerance;Impaired balance (sitting and/or standing);Decreased  knowledge of use of DME or AE;Decreased cognition      OT Treatment/Interventions: Self-care/ADL training;DME and/or AE instruction;Therapeutic activities;Patient/family education;Balance training    OT Goals(Current goals can be found in the care plan section) Acute Rehab OT Goals Patient Stated Goal: stop feeling dizzy OT Goal Formulation: With patient Time For Goal  Achievement: 10/22/21 Potential to Achieve Goals: Fair ADL Goals Pt Will Perform Grooming: with min guard assist;sitting;with set-up Pt Will Perform Upper Body Dressing: with min guard assist;sitting Pt Will Perform Lower Body Dressing: with min assist;sit to/from stand;sitting/lateral leans;with min guard assist Pt Will Transfer to Toilet: with min assist;squat pivot transfer;stand pivot transfer;bedside commode;ambulating Pt/caregiver will Perform Home Exercise Program: With written HEP provided;With Supervision  OT Frequency: Min 2X/week    Co-evaluation              AM-PAC OT "6 Clicks" Daily Activity     Outcome Measure Help from another person eating meals?: None Help from another person taking care of personal grooming?: A Little Help from another person toileting, which includes using toliet, bedpan, or urinal?: A Lot Help from another person bathing (including washing, rinsing, drying)?: A Lot Help from another person to put on and taking off regular upper body clothing?: A Little Help from another person to put on and taking off regular lower body clothing?: A Lot 6 Click Score: 16   End of Session Equipment Utilized During Treatment: Oxygen Nurse Communication: Other (comment) (discussed timing OOB to chair with presence of family to encourage pt)  Activity Tolerance: Patient limited by fatigue;Other (comment) (dizziness) Patient left: in bed;with call bell/phone within reach;with bed alarm set  OT Visit Diagnosis: Unsteadiness on feet (R26.81);Other abnormalities of gait and mobility (R26.89);Muscle weakness (generalized) (M62.81);Other symptoms and signs involving cognitive function                Time: 7414-2395 OT Time Calculation (min): 15 min Charges:  OT General Charges $OT Visit: 1 Visit OT Evaluation $OT Eval Moderate Complexity: [place], OT Acute Rehabilitation Services Office: [phone]   Hortencia Pilar 10/21/2021, 10:19 AM

## 2021-10-21 NOTE — Progress Notes (Signed)
NAME:  [name], MRN:  [id number], DOB:  05-Aug-1950, LOS: 2 ADMISSION DATE:  10/19/2021, CONSULTATION DATE:  6/20 REFERRING MD:  Tamala Julian, CHIEF COMPLAINT:  SOB   History of Present Illness:  Mark Thomas is a 71 y.o. 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  He did had COVID in 12/22 and has had problems with respiratory difficulty since.  He has been followed in the office by Dr. Erin Fulling.  Recent admission from 6/4 to 6/15 for multifocal pneumonia. He was discharged to rehab.   He presented again to the [facility] ED on 6/20 with concerns of worsening SOB, dyspnea.  PCCM consulted by [facility] for assistance with pneumonia management  Pertinent  Medical History  Mild ILD, chronic respiratory failure (3-4L) GERD,  CAD S/P stent, HTN, CKD, afib  Significant Hospital Events: Including procedures, antibiotic start and stop dates in addition to other pertinent events   PCCM consult  Interim History / Subjective:  -2 L x 24 hours Blood pressure is labile  Objective   Blood pressure 104/74, pulse 91, temperature 97.8 F (36.6 C), temperature source Oral, resp. rate 16, height _0  (1.854 m), weight 106.2 kg, SpO2 91 %.        Intake/Output Summary (Last 24 hours) at 10/21/2021 0818 Last data filed at 10/21/2021 0335 Gross per 24 hour  Intake 360 ml  Output 2350 ml  Net -1990 ml   Filed Weights   10/19/21 1018 10/20/21 0300  Weight: 117 kg 106.2 kg    Examination: General: Elderly male who appears very fragile HEENT: MM pink/moist no JVD or lymphadenopathy is appreciated Neuro: Grossly intact without focal defect CV: Heart sounds are regular PULM: Diminished bilaterally in the bases on 3 L nasal cannula sats 98% Chest x-ray actually appears improved GI: soft, bsx4 active  GU: Voids Extremities: warm/dry, 1+  edema  Skin: no rashes or lesions    Resolved Hospital Problem list     Assessment & Plan:  Possible HCAP Mild ILD Continue antimicrobial therapy Ease off on the diuresis Bronchodilators Steroids Mobilization  Labile blood pressure takes midodrine plus Lopressor  Intake/Output Summary (Last 24 hours) at 10/21/2021 0821 Last data filed at 10/21/2021 0335 Gross per 24 hour  Intake 360 ml  Output 2350 ml  Net -1990 ml  -2 L for last 24 hours therefore he is off of diuresis Consider stopping Lopressor Blood pressure is noted to be labile with systolic being 1 38-75 from blood pressure check times. Would not put on pressors   GERD Constipation Abdomen enlarged, last BM Sunday 06/18 KUB demonstrated nonobstructive bowel pattern MiraLAX and senna Consider fleets enema PPI   All other issues per primary  Best Practice (right click and "Reselect all SmartList Selections" daily)   Per primary  Labs   CBC: Recent Labs  Lab 10/19/21 1200 10/19/21 1228 10/20/21 0456 10/21/21 0033  WBC 15.9*  --  12.0* 12.2*  NEUTROABS 13.4*  --   --   --   HGB 11.0* 11.6* 10.5* 10.4*  HCT 34.8* 34.0* 33.6* 33.3*  MCV 101.8*  --  104.0* 102.5*  PLT 204  --  187 643    Basic Metabolic Panel: Recent Labs  Lab 10/19/21 1200 10/19/21 1228 10/20/21 0456 10/21/21 0033  NA 140 139 142 139  K 3.9 3.9 4.0 4.6  CL 100  --  104 101  CO2 29  --  27 26  GLUCOSE 119*  --  97 150*  BUN 19  --  22 26*  CREATININE 1.08  --  1.08 1.40*  CALCIUM 9.4  --  8.9 9.0  MG  --   --   --  1.8   GFR: Estimated Creatinine Clearance: 64.6 mL/min (A) (by C-G formula based on SCr of 1.4 mg/dL (H)). Recent Labs  Lab 10/19/21 1200 10/20/21 0442 10/20/21 0456 10/21/21 0033  PROCALCITON  --  <0.10  --   --   WBC 15.9*  --  12.0* 12.2*  LATICACIDVEN 1.3 1.0  --   --     Liver Function Tests: Recent Labs  Lab 10/19/21 1200  AST 36  ALT 41  ALKPHOS 76  BILITOT 0.8  PROT 7.1  ALBUMIN 2.8*    No results for input(s): "LIPASE", "AMYLASE" in the last 168 hours. No results for input(s): "AMMONIA" in the last 168 hours.  ABG    Component Value Date/Time   HCO3 31.4 (H) 10/19/2021 1228   TCO2 33 (H) 10/19/2021 1228   O2SAT 82 10/19/2021 1228     Coagulation Profile: No results for input(s): "INR", "PROTIME" in the last 168 hours.  Cardiac Enzymes: No results for input(s): "CKTOTAL", "CKMB", "CKMBINDEX", "TROPONINI" in the last 168 hours.  HbA1C: Hgb A1c MFr Bld  Date/Time Value Ref Range Status  01/08/2021 01:38 AM 6.0 (H) 4.8 - 5.6 % Final    Comment:    (NOTE) Pre diabetes:          5.7%-6.4%  Diabetes:              >6.4%  Glycemic control for   <7.0% adults with diabetes     CBG: No results for input(s): "GLUCAP" in the last 168 hours.    Critical care time: n/a    Richardson Landry Nikia Levels ACNP Acute Care Nurse Practitioner [place] Please consult Amion 10/21/2021, 8:18 AM

## 2021-10-21 NOTE — Progress Notes (Signed)
Physical Therapy Treatment Patient Details Name: Mark Thomas MRN: [id number] DOB: 06/11/1950 Today's Date: 10/21/2021   History of Present Illness 86M admitted 6/20  for increasing dyspnea since being at [facility]. He was recently admitted 6/5 to 6/15 and underwent bronchoscopy with BAL on 6/7. BAL cultures are unrevealing to date with neutrophil predominance on BAL cell count differential.  Chest x-ray showed concern for diffuse bilateral interstitial pulmonary infiltrates with a left worse than the right, moderate left-sided pleural effusion, and small right sided pleural effusion.  PMH: never smoker with CAD, CVA, GERD, hiatal hernia, recurrent pneumonia and mild ILD features    PT Comments    Pt continues to be limited by orthostatic BP and max c/o dizziness when coming to sit EOB unsupported. Session focused on progression from supine to sitting with incremental rise within pt tolerance, and encouraging pt to continue upright sitting as tolerated. Pt overall supervision for bed mobility with max c/o dizziness when sitting EOB x2 trials. Current plan remains appropriate to address deficits and maximize functional independence and decrease caregiver burden.Pt continues to benefit from skilled PT services to progress toward functional mobility goals.   Resting on arrival: 79 bpm, 95% 3L, 91/64 Sitting EOB: 66/50 Returned to supine: 83/61  HOB 35* 91/64,  HOB 50* 101/75,  HOB 62* 90/77 - 104/71 after 2 mins,  Sitting EOB 95/68 max c/o dizziness Laid back to 62* 88/66 HOB 49* 100/65, left pt in bed with HOB at 58* asymptomatic. (RN aware)   Recommendations for follow up therapy are one component of a multi-disciplinary discharge planning process, led by the attending physician.  Recommendations may be updated based on patient status, additional functional criteria and insurance authorization.  Follow Up Recommendations  Skilled nursing-short term rehab (<3 hours/day)     Assistance  Recommended at Discharge Intermittent Supervision/Assistance  Patient can return home with the following Assistance with cooking/housework;Direct supervision/assist for medications management;Direct supervision/assist for financial management;A lot of help with walking and/or transfers;Assist for transportation   Equipment Recommendations  None recommended by PT    Recommendations for Other Services       Precautions / Restrictions Precautions Precautions: Fall Precaution Comments: watch for orthostasis Restrictions Weight Bearing Restrictions: No     Mobility  Bed Mobility Overal bed mobility: Needs Assistance Bed Mobility: Supine to Sit     Supine to sit: Supervision, Min guard Sit to supine: Supervision   General bed mobility comments: no cues needed; guarding for safety as moving quickly. continues to be orthostatic    Transfers                   General transfer comment: unable due to orthostasis    Ambulation/Gait                   Stairs             Wheelchair Mobility    Modified Rankin (Stroke Patients Only)       Balance Overall balance assessment: Needs assistance Sitting-balance support: No upper extremity supported, Feet supported Sitting balance-Leahy Scale: Fair     Standing balance support: Bilateral upper extremity supported, During functional activity, Reliant on assistive device for balance Standing balance-Leahy Scale: Poor Standing balance comment: needs BUE support or external assist with U UE support when checking BP                            Cognition Arousal/Alertness: Awake/alert (  Simultaneous filing. User may not have seen previous data.) Behavior During Therapy: Flat affect (Simultaneous filing. User may not have seen previous data.) Overall Cognitive Status: History of cognitive impairments - at baseline (Simultaneous filing. User may not have seen previous data.) Area of Impairment: Problem  solving, Attention, Safety/judgement                 Orientation Level: Place, Time, Disoriented to Current Attention Level: Selective Memory: Decreased short-term memory   Safety/Judgement: Decreased awareness of safety   Problem Solving: Slow processing, Difficulty sequencing, Requires verbal cues, Requires tactile cues          Exercises General Exercises - Lower Extremity Ankle Circles/Pumps: AROM, 20 reps, Supine    General Comments General comments (skin integrity, edema, etc.): 79 bpm, 95% 3L, 91/64 on arrival, sitting EOB 66/50, 83/61 once laid back, 35* 91/64, 50* 101/75, 62* 90/77 - 104/71 after 2 mins, sitting EOB 95/68 max dizzy laid back to 62* 88/66, 49* 100/65, left pt at 58* asymptomatic. (RN aware)      Pertinent Vitals/Pain Pain Assessment Pain Assessment: No/denies pain (Simultaneous filing. User may not have seen previous data.)    Home Living Family/patient expects to be discharged to:: Skilled nursing facility Living Arrangements: Spouse/significant other Available Help at Discharge: Available 24 hours/day;Family Type of Home: House Home Access: Level entry       Home Layout: One level Home Equipment: Shower seat;Shower seat - built in;Hand held Museum/gallery curator (4 wheels);Toilet riser;Wheelchair - Insurance risk surveyor Comments: added grab bar horziontal the length of the bathroom - home health OT requested and pt had it installed. pt has golden Pharmacist, hospital- [place]    Prior Function            PT Goals (current goals can now be found in the care plan section) Acute Rehab PT Goals Patient Stated Goal: breathe better and go home PT Goal Formulation: With patient Time For Goal Achievement: 11/04/21    Frequency    Min 2X/week      PT Plan      Co-evaluation              AM-PAC PT "6 Clicks" Mobility   Outcome Measure  Help needed turning from your back to your side while in a flat bed  without using bedrails?: None Help needed moving from lying on your back to sitting on the side of a flat bed without using bedrails?: A Little Help needed moving to and from a bed to a chair (including a wheelchair)?: A Little Help needed standing up from a chair using your arms (e.g., wheelchair or bedside chair)?: A Lot Help needed to walk in hospital room?: Total Help needed climbing 3-5 steps with a railing? : Total 6 Click Score: 14    End of Session Equipment Utilized During Treatment: Oxygen Activity Tolerance: Other (comment) (limited by BP/orthostasis) Patient left: with call bell/phone within reach;in bed;with bed alarm set Nurse Communication: Mobility status PT Visit Diagnosis: History of falling (Z91.81);Difficulty in walking, not elsewhere classified (R26.2);Dizziness and giddiness (R42)     Time: 4758-3074 PT Time Calculation (min) (ACUTE ONLY): 31 min  Charges:  $Therapeutic Activity: 23-37 mins                     Nyal Schachter R. PTA Acute Rehabilitation Services Office: [place] 10/21/2021, 10:06 AM

## 2021-10-21 NOTE — TOC Progression Note (Signed)
Transition of Care [facility]) - Progression Note    Patient Details  Name: Mark Thomas MRN: [id number] Date of Birth: May 21, 1950  Transition of Care [facility]) CM/SW [place], RN Phone [phone]  10/21/2021, 4:00 PM  Clinical Narrative:    TOC acknowledges need for disposition planning for patient admitted from [facility]. SW will follow for return to [facility] when medically ready for d/c.         Expected Discharge Plan and Services                                                 Social Determinants of Health (SDOH) Interventions    Readmission Risk Interventions    10/12/2021    3:48 PM  Readmission Risk Prevention Plan  Transportation Screening Complete  Medication Review ([place]) Complete  PCP or Specialist appointment within 3-5 days of discharge Complete  HRI or [place] Complete  SW Recovery Care/Counseling Consult Complete  [place] Complete

## 2021-10-22 DIAGNOSIS — J189 Pneumonia, unspecified organism: Secondary | ICD-10-CM

## 2021-10-22 LAB — BASIC METABOLIC PANEL
Anion gap: 10 (ref 5–15)
BUN: 32 mg/dL — ABNORMAL HIGH (ref 8–23)
CO2: 24 mmol/L (ref 22–32)
Calcium: 8.8 mg/dL — ABNORMAL LOW (ref 8.9–10.3)
Chloride: 101 mmol/L (ref 98–111)
Creatinine, Ser: 1.27 mg/dL — ABNORMAL HIGH (ref 0.61–1.24)
GFR, Estimated: >60 mL/min (ref >60)
Glucose, Bld: 149 mg/dL — ABNORMAL HIGH (ref 70–99)
Potassium: 5 mmol/L (ref 3.5–5.1)
Sodium: 135 mmol/L (ref 135–145)

## 2021-10-22 LAB — LEGIONELLA PNEUMOPHILA SEROGP 1 UR AG: L. pneumophila Serogp 1 Ur Ag: NEGATIVE

## 2021-10-22 LAB — MAGNESIUM: Magnesium: 2 mg/dL (ref 1.7–2.4)

## 2021-10-22 LAB — FUNGITELL, SERUM: Fungitell Result: 43 pg/mL (ref <80)

## 2021-10-22 MED ORDER — FUROSEMIDE 10 MG/ML IJ SOLN
20.0000 mg | Freq: Once | INTRAMUSCULAR | Status: AC
  Administered 2021-10-22: 20 mg via INTRAVENOUS
  Filled 2021-10-22: qty 2

## 2021-10-22 MED ORDER — OXYCODONE-ACETAMINOPHEN 5-325 MG PO TABS
1.0000 | ORAL_TABLET | Freq: Two times a day (BID) | ORAL | Status: DC | PRN
  Administered 2021-10-22 – 2021-10-25 (×6): 1 via ORAL
  Filled 2021-10-22 (×6): qty 1

## 2021-10-22 NOTE — Progress Notes (Signed)
PROGRESS NOTE    Mark Thomas  WUJ:[id number] DOB: 1950/08/16 DOA: 10/19/2021 PCP: Everrett Coombe, DO   Brief Narrative:  71 year old with history of CAD status post PCI, paroxysmal A-fib status post watchman's device not on anticoagulation, CVA, ASD status postclosure, vascular dementia, orthostatic hypotension on midodrine and Florinef, CKD stage IIIa admitted for shortness of breath.  Patient was here about 2 weeks ago admitted for hypoxia due to multifocal pneumonia underwent bronchoscopy and cultures were negative eventually treated with 10 days of cefepime and additional 5 days of oral Levaquin at discharge.  Modified barium swallow on 4/28 did not show any signs of aspiration.  Chest x-ray shows bilateral infiltrates, moderate left-sided pleural effusion and small on the right.  Bedside ultrasound revealed atelectasis therefore ongoing diuresis, steroids and antibiotics.  Cautious with diuresis due to elevated renal function but now slowly improving.   Assessment & Plan:  Principal Problem:   Recurrent pneumonia Active Problems:   Acute respiratory failure with hypoxia (HCC)   Pleural effusion   Vascular dementia with increased confusion/anger   Orthostatic hypotension   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, likely ILD    Paroxysmal atrial fibrillation (HCC) s/p Watchman device   CAD (coronary artery disease)   CKD (chronic kidney disease) stage 3, GFR 30-59 ml/min (HCC)   Anemia   Gastroesophageal reflux disease without esophagitis   Major depressive disorder, single episode, severe, 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HCC)   Pacemaker   GAD (generalized anxiety disorder)   History of CVA (cerebrovascular accident)   Hypoalbuminemia   Leukocytosis    Acute respiratory failure with hypoxia secondary to recurrent pneumonia with atelectasis -Bedside ultrasound showed atelectasis.  No evidence of pleural effusion..  This morning 3 L nasal cannula. -Concerns of recurrent pneumonia versus  previously unresolved pneumonia. - Procalcitonin negative, BNP 165 - Bronchodilators.  I-S/flutter valve.  O2 as needed.  Out of bed to chair -Pulmonary following.  Bedside ultrasound-shows atelectasis - Initial Fungitell from 6/9-negative.  Repeat Fungitell- pending, LDH 213 - On prednisone and IV vancomycin/Cefepime.  Plan to complete 5-day course.  Low threshold to stop it.   Acute on chronic heart failure with preserved EF EF 70%, grade 2 DD -Secondary to pleural effusion.  Echocardiogram 08/27/21 showed EF of 70% with grade 2 DD.  We will give 1 dose of Lasix 20 mg IV today  Acute kidney injury - Creatinine baseline 1.0.  Creatinine bumped to 1.4 with aggressive IV Lasix therefore held on 6/22.  We will give 1 dose today   Leukocytosis -Stable.  On steroids, last day 6/25   History of interstitial lung disease -Suspect secondary to GERD.  PPI.   Elevated troponin -Suspect demand ischemia.   Orthostatic hypotension - Midodrine increased to 5 mg 3 times daily - She is also on metoprolol for A-fib. -Florinef had previously been stopped -PT/OT-SNF  Sick sinus syndrome s/p PPM SVT history of atrial fibrillation s/p ablation and Watchman device -Seen by cardiology in the past.  No anticoagulation - Continue metoprolol   Macrocytic anemia -Chronic.  Monitor hemoglobin. B12 folate-normal   Coronary artery disease -On aspirin, Plavix, statin.  Currently chest pain-free. - Continue Ranexa   History of CVA No residual deficits reported. -On aspirin, Plavix and statin   Hypoalbuminemia -Albumin admission 2.8   Vascular dementia -Supportive care.  On Namenda and Seroquel   Anxiety and depression Restless leg syndrome -Continue Lexapro.  Ropinirole   GERD -On PPI   Obesity BMI 34.03 kg/m2  DVT prophylaxis: Lovenox Code Status: Full Code Family Communication:  Wife Updated.   Status is: Inpatient Remains inpatient appropriate because: Still deconditioned and  hypoxic.  Maintain hospital stay for at least next 24-48 hours.  Pulmonary team is following   Subjective: Feels ok, gets exertional dyspnea.   Examination: Constitutional: Not in acute distress; 4L [place]. Chronically Ill.  Respiratory: bibasilar rhonchi Cardiovascular: Normal sinus rhythm, no rubs Abdomen: Nontender nondistended good bowel sounds Musculoskeletal: No edema noted Skin: No rashes seen Neurologic: CN 2-12 grossly intact.  And nonfocal Psychiatric: Normal judgment and insight. Alert and oriented x 3. Normal mood.    Objective: Vitals:   10/21/21 2032 10/21/21 2300 10/22/21 0400 10/22/21 0429  BP: 114/73 117/85 118/74   Pulse:  98 78   Resp:  15 14   Temp:  97.9 F (36.6 C) 97.9 F (36.6 C)   TempSrc:  Oral Oral   SpO2:  97% 95%   Weight:    107.1 kg  Height:        Intake/Output Summary (Last 24 hours) at 10/22/2021 0741 Last data filed at 10/22/2021 0425 Gross per 24 hour  Intake 877.63 ml  Output 1400 ml  Net -522.37 ml   Filed Weights   10/19/21 1018 10/20/21 0300 10/22/21 0429  Weight: 117 kg 106.2 kg 107.1 kg     Data Reviewed:   CBC: Recent Labs  Lab 10/19/21 1200 10/19/21 1228 10/20/21 0456 10/21/21 0033  WBC 15.9*  --  12.0* 12.2*  NEUTROABS 13.4*  --   --   --   HGB 11.0* 11.6* 10.5* 10.4*  HCT 34.8* 34.0* 33.6* 33.3*  MCV 101.8*  --  104.0* 102.5*  PLT 204  --  187 217   Basic Metabolic Panel: Recent Labs  Lab 10/19/21 1200 10/19/21 1228 10/20/21 0456 10/21/21 0033 10/22/21 0128  NA 140 139 142 139 135  K 3.9 3.9 4.0 4.6 5.0  CL 100  --  104 101 101  CO2 29  --  27 26 24   GLUCOSE 119*  --  97 150* 149*  BUN 19  --  22 26* 32*  CREATININE 1.08  --  1.08 1.40* 1.27*  CALCIUM 9.4  --  8.9 9.0 8.8*  MG  --   --   --  1.8 2.0   GFR: Estimated Creatinine Clearance: 71.5 mL/min (A) (by C-G formula based on SCr of 1.27 mg/dL (H)). Liver Function Tests: Recent Labs  Lab 10/19/21 1200  AST 36  ALT 41  ALKPHOS 76  BILITOT  0.8  PROT 7.1  ALBUMIN 2.8*   No results for input(s): "LIPASE", "AMYLASE" in the last 168 hours. No results for input(s): "AMMONIA" in the last 168 hours. Coagulation Profile: No results for input(s): "INR", "PROTIME" in the last 168 hours. Cardiac Enzymes: No results for input(s): "CKTOTAL", "CKMB", "CKMBINDEX", "TROPONINI" in the last 168 hours. BNP (last 3 results) Recent Labs    03/18/21 1004  PROBNP 370   HbA1C: No results for input(s): "HGBA1C" in the last 72 hours. CBG: No results for input(s): "GLUCAP" in the last 168 hours. Lipid Profile: No results for input(s): "CHOL", "HDL", "LDLCALC", "TRIG", "CHOLHDL", "LDLDIRECT" in the last 72 hours. Thyroid Function Tests: Recent Labs    10/20/21 0456  TSH 3.377   Anemia Panel: Recent Labs    10/20/21 0456 10/21/21 0033  VITAMINB12 548  --   FOLATE  --  15.6   Sepsis Labs: Recent Labs  Lab 10/19/21 1200 10/20/21 0442  PROCALCITON  --  <0.10  LATICACIDVEN 1.3 1.0    Recent Results (from the past 240 hour(s))  Respiratory (~20 pathogens) panel by PCR     Status: None   Collection Time: 10/19/21 10:33 PM   Specimen: Urine, Clean Catch; Respiratory  Result Value Ref Range Status   Adenovirus NOT DETECTED NOT DETECTED Final   Coronavirus 229E NOT DETECTED NOT DETECTED Final    Comment: (NOTE) The Coronavirus on the Respiratory Panel, DOES NOT test for the novel  Coronavirus (2019 nCoV)    Coronavirus HKU1 NOT DETECTED NOT DETECTED Final   Coronavirus NL63 NOT DETECTED NOT DETECTED Final   Coronavirus OC43 NOT DETECTED NOT DETECTED Final   Metapneumovirus NOT DETECTED NOT DETECTED Final   Rhinovirus / Enterovirus NOT DETECTED NOT DETECTED Final   Influenza A NOT DETECTED NOT DETECTED Final   Influenza B NOT DETECTED NOT DETECTED Final   Parainfluenza Virus 1 NOT DETECTED NOT DETECTED Final   Parainfluenza Virus 2 NOT DETECTED NOT DETECTED Final   Parainfluenza Virus 3 NOT DETECTED NOT DETECTED Final    Parainfluenza Virus 4 NOT DETECTED NOT DETECTED Final   Respiratory Syncytial Virus NOT DETECTED NOT DETECTED Final   Bordetella pertussis NOT DETECTED NOT DETECTED Final   Bordetella Parapertussis NOT DETECTED NOT DETECTED Final   Chlamydophila pneumoniae NOT DETECTED NOT DETECTED Final   Mycoplasma pneumoniae NOT DETECTED NOT DETECTED Final    Comment: Performed at [facility], [address], [place], [place] [address]  MRSA Next Gen by PCR, Nasal     Status: None   Collection Time: 10/20/21  2:59 AM   Specimen: Nasal Mucosa; Nasal Swab  Result Value Ref Range Status   MRSA by PCR Next Gen NOT DETECTED NOT DETECTED Final    Comment: (NOTE) The GeneXpert MRSA Assay (FDA approved for NASAL specimens only), is one component of a comprehensive MRSA colonization surveillance program. It is not intended to diagnose MRSA infection nor to guide or monitor treatment for MRSA infections. Test performance is not FDA approved in patients less than 35 years old. Performed at [facility], [address] [place], [place] [address]          Radiology Studies: DG Chest Port 1 View  Result Date: 10/21/2021 CLINICAL DATA:  Shortness of breath, post bronchoscopy EXAM: PORTABLE CHEST 1 VIEW COMPARISON:  10/19/2021 FINDINGS: Rotated AP portable examination. Cardiomegaly. Left chest multi lead pacer. Bilateral heterogeneous airspace opacity, not significantly changed. No new airspace opacity. IMPRESSION: 1. Rotated AP portable examination. Bilateral heterogeneous airspace opacity, not significantly changed. No new airspace opacity. 2. Cardiomegaly. Electronically Signed   By: Jearld Lesch M.D.   On: 10/21/2021 08:25        Scheduled Meds:  allopurinol  100 mg Oral Daily   arformoterol  15 mcg Nebulization BID   aspirin EC  81 mg Oral Daily   atorvastatin  80 mg Oral Daily   budesonide (PULMICORT) nebulizer solution  0.25 mg Nebulization BID   clopidogrel  75 mg Oral Daily    enoxaparin (LOVENOX) injection  55 mg Subcutaneous Q24H   escitalopram  30 mg Oral Daily   guaiFENesin  600 mg Oral BID   ipratropium-albuterol  3 mL Nebulization TID   latanoprost  1 drop Both Eyes QHS   magnesium oxide  400 mg Oral Daily   memantine  10 mg Oral BID   metoprolol tartrate  37.5 mg Oral BID   midodrine  5 mg Oral  TID WC   pantoprazole  40 mg Oral QPM   polyethylene glycol  17 g Oral BID   predniSONE  40 mg Oral Q breakfast   primidone  100 mg Oral BID   QUEtiapine  100 mg Oral QHS   ranolazine  1,000 mg Oral BID   rOPINIRole  4 mg Oral QHS   sodium chloride flush  3 mL Intravenous Q12H   Continuous Infusions:  ceFEPime (MAXIPIME) IV 2 g (10/22/21 0705)   vancomycin 1,000 mg (10/21/21 2219)     LOS: 3 days   Time spent= 35 mins    Delvina Mizzell Joline Maxcy, MD [facility]  If 7PM-7AM, please contact night-coverage  10/22/2021, 7:41 AM

## 2021-10-22 NOTE — TOC Initial Note (Signed)
Transition of Care [facility]) - Initial/Assessment Note    Patient Details  Name: Mark Thomas MRN: [id number] Date of Birth: [birth year]  Transition of Care [facility]) CM/SW Contact:    Lynett Grimes Phone Number: 10/22/2021, 11:14 AM  Clinical Narrative:                 CSW received SNF consult. CSW met with pt spouse via phone. CSW introduced self and explained role at the hospital. Pt reports that PTA the pt was at [facility] for short term physical therapy. PT reports pt is min-modA pt gets dizzy and weak when standing. Pt has a click score of 14.  CSW reviewed PT/OT recommendations for SNF. Pt reports being agreeable to return to [facility] at [place]. Pt gave CSW permission to fax out to [place].  CSW will continue to follow.    Expected Discharge Plan: Skilled Nursing Facility Barriers to Discharge: Continued Medical Work up   Patient Goals and CMS Choice Patient states their goals for this hospitalization and ongoing recovery are:: Rehab CMS Medicare.gov Compare Post Acute Care list provided to:: Patient Choice offered to / list presented to : Patient  Expected Discharge Plan and Services Expected Discharge Plan: Skilled Nursing Facility In-house Referral: Clinical Social Work   Post Acute Care Choice: Skilled Nursing Facility Living arrangements for the past 2 months: Skilled Nursing Facility                                      Prior Living Arrangements/Services Living arrangements for the past 2 months: Skilled Nursing Facility Lives with:: Spouse Patient language and need for interpreter reviewed:: Yes        Need for Family Participation in Patient Care: Yes (Comment) Care giver support system in place?: Yes (comment)   Criminal Activity/Legal Involvement Pertinent to Current Situation/Hospitalization: No - Comment as needed  Activities of Daily Living Home Assistive Devices/Equipment: Wheelchair, Environmental consultant (specify type), Oxygen,  Shower chair with back ADL Screening (condition at time of admission) Patient's cognitive ability adequate to safely complete daily activities?: Yes Is the patient deaf or have difficulty hearing?: No Does the patient have difficulty seeing, even when wearing glasses/contacts?: No Does the patient have difficulty concentrating, remembering, or making decisions?: No Patient able to express need for assistance with ADLs?: Yes Does the patient have difficulty dressing or bathing?: Yes Independently performs ADLs?: No Does the patient have difficulty walking or climbing stairs?: Yes Weakness of Legs: Both Weakness of Arms/Hands: None  Permission Sought/Granted   Permission granted to share information with : Yes, Verbal Permission Granted  Share Information with NAME: Coscia,ANNETT  Permission granted to share info w AGENCY: SNF's  Permission granted to share info w Relationship: Spouse  Permission granted to share info w Contact Information: [phone]  Emotional Assessment Appearance:: Appears stated age Attitude/Demeanor/Rapport: Engaged Affect (typically observed): Accepting, Appropriate Orientation: : Oriented to Self, Oriented to Place, Oriented to  Time, Oriented to Situation Alcohol / Substance Use: Not Applicable Psych Involvement: No (comment)  Admission diagnosis:  Recurrent pneumonia [J18.9] Multifocal pneumonia [J18.9] Sepsis without acute organ dysfunction, due to unspecified organism [facility]) [A41.9] Patient Active Problem List   Diagnosis Date Noted   Recurrent pneumonia 10/19/2021   Hypoalbuminemia 10/19/2021   Pleural effusion 10/19/2021   Leukocytosis 10/19/2021   Supraventricular tachycardia (HCC) 10/05/2021   History of CVA (cerebrovascular accident) 10/04/2021  Hemoptysis 10/04/2021   Shingles 09/14/2021   CAD (coronary artery disease) 08/26/2021   Sick sinus syndrome s/p PPM  08/26/2021   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, likely ILD  08/26/2021    Acute respiratory failure with hypoxia (HCC) 08/26/2021   Dyspnea 08/26/2021   Lobar pneumonia, unspecified organism (HCC) 08/05/2021   Hypotension 08/04/2021   Kidney cysts 08/04/2021   Coughing 07/28/2021   Seborrheic keratosis 07/28/2021   Peripheral venous insufficiency 07/07/2021   Psoriasis 07/07/2021   Sensorineural hearing loss, unilateral, left ear, with unrestricted hearing on the contralateral side 07/07/2021   Community acquired pneumonia 07/07/2021   Other specified problems related to psychosocial circumstances 03/30/2021   Tinea 03/30/2021   Xerosis cutis 03/30/2021   Stroke (HCC) 02/02/2021   Myocardial infarct (HCC) 02/02/2021   Weakness 01/09/2021   Aortic stenosis 01/08/2021   Vascular dementia with increased confusion/anger 01/08/2021   TIA (transient ischemic attack) 01/07/2021   Rotator cuff arthropathy of right shoulder 12/30/2020   Coronary arteriosclerosis 12/24/2020   Chronic alcoholism in remission (HCC) 12/24/2020   Disorder of bursae and tendons in shoulder region 12/24/2020   Gastric ulcer with hemorrhage 12/24/2020   Gout 12/24/2020   Major depressive disorder, single episode, severe, 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HCC) 12/24/2020   Mitral valve prolapse 12/24/2020   Other and unspecified mycoses 12/24/2020   Chronic gingivitis, non-plaque induced 12/24/2020   Posttraumatic stress disorder 12/24/2020   Paroxysmal atrial fibrillation (HCC) s/p Watchman device 12/24/2020   Essential tremor 12/24/2020   GAD (generalized anxiety disorder) 12/24/2020   Anemia 09/30/2020   Restless legs syndrome (RLS) 09/30/2020   Elevated liver enzymes 05/26/2020   Orthostatic hypotension 10/01/2018   Depression 06/01/2018   Gastroesophageal reflux disease without esophagitis 12/13/2017   Insomnia 09/26/2017   CKD (chronic kidney disease) stage 3, GFR 30-59 ml/min (HCC) 09/14/2017   Pacemaker 02/17/2017   History of cardiac radiofrequency ablation (RFA) 12/18/2016    Sinus bradycardia 12/18/2016   Erectile dysfunction 02/12/2013   Other and unspecified hyperlipidemia 02/12/2013   Presence of stent in LAD coronary artery 02/12/2013   PCP:  Everrett Coombe, DO Pharmacy:   [id number] - [place], [place] - [address] [place] [address] Phone: [phone] Fax: [phone]  [facility] - [place], [place] - [address] [place] [address] Phone: [phone] Fax: [phone]     Social Determinants of Health (SDOH) Interventions    Readmission Risk Interventions    10/12/2021    3:48 PM  Readmission Risk Prevention Plan  Transportation Screening Complete  Medication Review (RN Care Manager) Complete  PCP or Specialist appointment within 3-5 days of discharge Complete  HRI or Home Care Consult Complete  SW Recovery Care/Counseling Consult Complete  Palliative Care Screening Not Applicable  Skilled Nursing Facility Complete

## 2021-10-22 NOTE — Progress Notes (Signed)
NAME:  Mark Thomas, MRN:  [id number], DOB:  [birth year], LOS: 3 ADMISSION DATE:  10/19/2021, CONSULTATION DATE:  6/20 REFERRING MD:  Katrinka Blazing, CHIEF COMPLAINT:  SOB   History of Present Illness:  Mark Thomas is a 71 y.o. 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  He did had COVID in 12/22 and has had problems with respiratory difficulty since.  He has been followed in the office by Dr. Francine Graven.  Recent admission from 6/4 to 6/15 for multifocal pneumonia. He was discharged to rehab.   He presented again to the [facility] ED on 6/20 with concerns of worsening SOB, dyspnea.  PCCM consulted by [facility] for assistance with pneumonia management  Pertinent  Medical History  Mild ILD, chronic respiratory failure (3-4L) GERD,  CAD S/P stent, HTN, CKD, afib  Significant Hospital Events: Including procedures, antibiotic start and stop dates in addition to other pertinent events   PCCM consult  Interim History / Subjective:  Refuses to ambulate  Objective   Blood pressure (!) 141/92, pulse 98, temperature 98 F (36.7 C), temperature source Oral, resp. rate 16, height 6\' 1"  (1.854 m), weight 107.1 kg, SpO2 96 %.        Intake/Output Summary (Last 24 hours) at 10/22/2021 0941 Last data filed at 10/22/2021 0800 Gross per 24 hour  Intake 880.63 ml  Output 800 ml  Net 80.63 ml   Filed Weights   10/19/21 1018 10/20/21 0300 10/22/21 0429  Weight: 117 kg 106.2 kg 107.1 kg    Examination: General: Elderly male no acute distress HEENT: MM pink/moist no JVD Neuro: Grossly intact without focal defect CV: Heart sounds are distant PULM: Mild rhonchi GI: soft, bsx4 active distended GU: Voids Extremities: warm/dry, 2+ edema  Skin: no rashes or lesions multiple areas of ecchymosis     Resolved Hospital Problem list      Assessment & Plan:  Possible HCAP Continue antimicrobial therapy Diuresis as tolerated Bronchodilators steroids Refuses  physical therapy Pulmonary critical care will sign off at this time    Labile blood pressure takes midodrine plus Lopressor  Intake/Output Summary (Last 24 hours) at 10/22/2021 0941 Last data filed at 10/22/2021 0800 Gross per 24 hour  Intake 880.63 ml  Output 800 ml  Net 80.63 ml  Blood pressure more controlled No change in medications at this time  GERD Constipation Still without bowel movement consider fleets enema   All other issues per primary  Best Practice (right click and "Reselect all SmartList Selections" daily)   Per primary  Labs   CBC: Recent Labs  Lab 10/19/21 1200 10/19/21 1228 10/20/21 0456 10/21/21 0033  WBC 15.9*  --  12.0* 12.2*  NEUTROABS 13.4*  --   --   --   HGB 11.0* 11.6* 10.5* 10.4*  HCT 34.8* 34.0* 33.6* 33.3*  MCV 101.8*  --  104.0* 102.5*  PLT 204  --  187 217    Basic Metabolic Panel: Recent Labs  Lab 10/19/21 1200 10/19/21 1228 10/20/21 0456 10/21/21 0033 10/22/21 0128  NA 140 139 142 139 135  K 3.9 3.9 4.0 4.6 5.0  CL 100  --  104 101 101  CO2 29  --  27 26 24   GLUCOSE 119*  --  97 150* 149*  BUN 19  --  22 26* 32*  CREATININE 1.08  --  1.08 1.40* 1.27*  CALCIUM 9.4  --  8.9 9.0 8.8*  MG  --   --   --  1.8 2.0   GFR: Estimated Creatinine Clearance: 71.5 mL/min (A) (by C-G formula based on SCr of 1.27 mg/dL (H)). Recent Labs  Lab 10/19/21 1200 10/20/21 0442 10/20/21 0456 10/21/21 0033  PROCALCITON  --  <0.10  --   --   WBC 15.9*  --  12.0* 12.2*  LATICACIDVEN 1.3 1.0  --   --     Liver Function Tests: Recent Labs  Lab 10/19/21 1200  AST 36  ALT 41  ALKPHOS 76  BILITOT 0.8  PROT 7.1  ALBUMIN 2.8*   No results for input(s): "LIPASE", "AMYLASE" in the last 168 hours. No results for input(s): "AMMONIA" in the last 168 hours.  ABG    Component Value Date/Time   HCO3 31.4 (H)  10/19/2021 1228   TCO2 33 (H) 10/19/2021 1228   O2SAT 82 10/19/2021 1228     Coagulation Profile: No results for input(s): "INR", "PROTIME" in the last 168 hours.  Cardiac Enzymes: No results for input(s): "CKTOTAL", "CKMB", "CKMBINDEX", "TROPONINI" in the last 168 hours.  HbA1C: Hgb A1c MFr Bld  Date/Time Value Ref Range Status  01/08/2021 01:38 AM 6.0 (H) 4.8 - 5.6 % Final    Comment:    (NOTE) Pre diabetes:          5.7%-6.4%  Diabetes:              >6.4%  Glycemic control for   <7.0% adults with diabetes     CBG: No results for input(s): "GLUCAP" in the last 168 hours.    Critical care time: n/a    Brett Canales Ivo Moga ACNP Acute Care Nurse Practitioner [facility] Pulmonary/Critical Care Please consult Amion 10/22/2021, 9:41 AM

## 2021-10-22 NOTE — Progress Notes (Signed)
Patient refused moving to chair or doing any ambulation in room/hallway, wife present in room

## 2021-10-23 DIAGNOSIS — J189 Pneumonia, unspecified organism: Secondary | ICD-10-CM | POA: Diagnosis not present

## 2021-10-23 LAB — CBC
HCT: 31 % — ABNORMAL LOW (ref 39.0–52.0)
Hemoglobin: 9.7 g/dL — ABNORMAL LOW (ref 13.0–17.0)
MCH: 31.4 pg (ref 26.0–34.0)
MCHC: 31.3 g/dL (ref 30.0–36.0)
MCV: 100.3 fL — ABNORMAL HIGH (ref 80.0–100.0)
Platelets: 200 10*3/uL (ref 150–400)
RBC: 3.09 MIL/uL — ABNORMAL LOW (ref 4.22–5.81)
RDW: 15 % (ref 11.5–15.5)
WBC: 10.7 10*3/uL — ABNORMAL HIGH (ref 4.0–10.5)
nRBC: 0 % (ref 0.0–0.2)

## 2021-10-23 LAB — BASIC METABOLIC PANEL
Anion gap: 7 (ref 5–15)
BUN: 25 mg/dL — ABNORMAL HIGH (ref 8–23)
CO2: 27 mmol/L (ref 22–32)
Calcium: 9.1 mg/dL (ref 8.9–10.3)
Chloride: 103 mmol/L (ref 98–111)
Creatinine, Ser: 1.05 mg/dL (ref 0.61–1.24)
GFR, Estimated: >60 mL/min (ref >60)
Glucose, Bld: 121 mg/dL — ABNORMAL HIGH (ref 70–99)
Potassium: 4.6 mmol/L (ref 3.5–5.1)
Sodium: 137 mmol/L (ref 135–145)

## 2021-10-23 LAB — MAGNESIUM: Magnesium: 2.1 mg/dL (ref 1.7–2.4)

## 2021-10-23 MED ORDER — IPRATROPIUM-ALBUTEROL 0.5-2.5 (3) MG/3ML IN SOLN
3.0000 mL | Freq: Two times a day (BID) | RESPIRATORY_TRACT | Status: DC
  Administered 2021-10-23 – 2021-10-26 (×6): 3 mL via RESPIRATORY_TRACT
  Filled 2021-10-23 (×6): qty 3

## 2021-10-24 DIAGNOSIS — J189 Pneumonia, unspecified organism: Secondary | ICD-10-CM | POA: Diagnosis not present

## 2021-10-24 LAB — BASIC METABOLIC PANEL
Anion gap: 8 (ref 5–15)
BUN: 21 mg/dL (ref 8–23)
CO2: 27 mmol/L (ref 22–32)
Calcium: 9.2 mg/dL (ref 8.9–10.3)
Chloride: 100 mmol/L (ref 98–111)
Creatinine, Ser: 1.01 mg/dL (ref 0.61–1.24)
GFR, Estimated: >60 mL/min (ref >60)
Glucose, Bld: 93 mg/dL (ref 70–99)
Potassium: 4.5 mmol/L (ref 3.5–5.1)
Sodium: 135 mmol/L (ref 135–145)

## 2021-10-24 LAB — MAGNESIUM: Magnesium: 2.1 mg/dL (ref 1.7–2.4)

## 2021-10-24 NOTE — Progress Notes (Signed)
Patient states to this RN that he does not want to live anymore due to health, and inability to care for himself.  Order already placed by day team for psych consult. MD notified and placed order for suicide precautions and 1:1 sitter.  Patient later on stating that his family helps give him a positive outlook and the will to want to be okay.  Patient denying the want to harm himself at this time. Sitter already at the bedside.

## 2021-10-25 DIAGNOSIS — F332 Major depressive disorder, recurrent severe without psychotic features: Secondary | ICD-10-CM

## 2021-10-25 DIAGNOSIS — J189 Pneumonia, unspecified organism: Secondary | ICD-10-CM | POA: Diagnosis not present

## 2021-10-25 LAB — BASIC METABOLIC PANEL
Anion gap: 6 (ref 5–15)
BUN: 19 mg/dL (ref 8–23)
CO2: 28 mmol/L (ref 22–32)
Calcium: 8.8 mg/dL — ABNORMAL LOW (ref 8.9–10.3)
Chloride: 104 mmol/L (ref 98–111)
Creatinine, Ser: 0.9 mg/dL (ref 0.61–1.24)
GFR, Estimated: >60 mL/min (ref >60)
Glucose, Bld: 91 mg/dL (ref 70–99)
Potassium: 4.1 mmol/L (ref 3.5–5.1)
Sodium: 138 mmol/L (ref 135–145)

## 2021-10-25 LAB — MAGNESIUM: Magnesium: 2.1 mg/dL (ref 1.7–2.4)

## 2021-10-25 MED ORDER — OXYCODONE-ACETAMINOPHEN 5-325 MG PO TABS: 1.0000 | ORAL_TABLET | Freq: Three times a day (TID) | ORAL | Status: DC | PRN

## 2021-10-25 MED ORDER — OXYCODONE-ACETAMINOPHEN 5-325 MG PO TABS
1.0000 | ORAL_TABLET | Freq: Two times a day (BID) | ORAL | Status: DC | PRN
  Administered 2021-10-25 – 2021-10-26 (×3): 1 via ORAL
  Filled 2021-10-25 (×3): qty 1

## 2021-10-25 MED ORDER — PREDNISONE 10 MG PO TABS
30.0000 mg | ORAL_TABLET | Freq: Every day | ORAL | Status: DC
  Administered 2021-10-26: 30 mg via ORAL
  Filled 2021-10-25: qty 1

## 2021-10-25 MED ORDER — CHLORHEXIDINE GLUCONATE CLOTH 2 % EX PADS: 6.0000 | MEDICATED_PAD | Freq: Every day | CUTANEOUS | Status: DC

## 2021-10-25 MED ORDER — PREDNISONE 20 MG PO TABS
30.0000 mg | ORAL_TABLET | Freq: Every day | ORAL | Status: DC
Start: 2021-10-26 — End: 2021-10-25

## 2021-10-25 MED ORDER — PREDNISONE 10 MG PO TABS
10.0000 mg | ORAL_TABLET | Freq: Every day | ORAL | Status: DC
Start: 2021-11-09 — End: 2021-10-26

## 2021-10-25 MED ORDER — PREDNISONE 20 MG PO TABS: 20.0000 mg | ORAL_TABLET | Freq: Every day | ORAL | Status: DC

## 2021-10-25 NOTE — Progress Notes (Addendum)
PROGRESS NOTE    Mark Thomas  WJX:[id number] DOB: 05-25-1950 DOA: 10/19/2021  PCP: Everrett Coombe, DO    Brief Narrative:  This 71 year old Male with PMH significant of CAD status post PCI, paroxysmal A-fib status post watchman's device,  not on anticoagulation, CVA, ASD status postclosure, vascular dementia, orthostatic hypotension on midodrine and Florinef, CKD stage IIIa admitted for shortness of breath.  Patient was here about 2 weeks ago when admitted for hypoxia due to multifocal pneumonia , He underwent bronchoscopy and cultures were negative, He was eventually treated with 10 days of cefepime and additional 5 days of oral Levaquin at discharge.  Modified barium swallow on 4/28 did not show any signs of aspiration.  Chest x-ray shows bilateral infiltrates, moderate left-sided pleural effusion and small on the right.  Bedside ultrasound revealed atelectasis therefore having ongoing diuresis, getting steroids and antibiotics.  Be cautious with diuresis due to elevated renal function but now slowly improving.   Assessment & Plan:   Principal Problem:   Recurrent pneumonia Active Problems:   Acute respiratory failure with hypoxia (HCC)   Pleural effusion   Vascular dementia with increased confusion/anger   Orthostatic hypotension   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, likely ILD    Paroxysmal atrial fibrillation (HCC) s/p Watchman device   CAD (coronary artery disease)   CKD (chronic kidney disease) stage 3, GFR 30-59 ml/min (HCC)   Anemia   Gastroesophageal reflux disease without esophagitis   Major depressive disorder, single episode, severe, 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HCC)   Pacemaker   GAD (generalized anxiety disorder)   History of CVA (cerebrovascular accident)   Hypoalbuminemia   Leukocytosis  Acute hypoxic respiratory failure sec. to recurrent pneumonia with atelectasis: Patient presented with shortness of breath, bedside ultrasound showed atelectasis.  No evidence of  pleural effusion. There was a concern about recurrent pneumonia versus previously unresolved pneumonia. Continue supplemental oxygen, remains on 3 L via nasal cannula Procalcitonin negative, BNP 165 Continue bronchodilators, I-S/ flutter valve.  Continue oxygen as needed.  Out of bed to chair PCCM following, bedside ultrasound shows atelectasis. Initial Fungitell from 6/9-negative.  Repeat Fungitell- pending, LDH 213 Completed 5-day course of IV cefepime.  ( Last day 6/25).  Continue prior prednisone taper 30 mg daily for 1 week then 20 mg daily for 1 week then 10 mg daily for 1 week and then discontinue.  Acute on chronic diastolic CHF: Secondary to pleural effusion.   Echo: 08/27/21 showed EF of 70% with grade 2 DD.   He was given a dose of Lasix 20 mg IV. Continue Lasix as needed.   Acute kidney injury: > Resolved. Likely due to  IV Lasix. Lasix discontinued, renal functions improved,  back to normal.  Leukocytosis: Stable.  Could be due to steroids.   History of interstitial lung disease: Suspect secondary to GERD. Continue PPI.   Elevated troponin Suspect demand ischemia in the setting of acute hypoxia.   Orthostatic hypotension: Continue midodrine 5 mg 3 times daily. He is also on metoprolol for A-fib. Florinef had previously been stopped PT/OT-SNF   Sick sinus syndrome s/p PPM: Hx. of atrial fibrillation s/p ablation and Watchman device Seen by cardiology in the past.  No anticoagulation Continue metoprolol   Macrocytic anemia Chronic.  Monitor hemoglobin. B12 folate-normal.   Coronary artery disease Continue aspirin, Plavix, statin.  Currently chest pain-free. Continue Ranexa.   History of CVA No residual deficits reported. Continue aspirin, Plavix and statin   Hypoalbuminemia Albumin admission 2.8   Vascular  dementia Supportive care. Continue Namenda and Seroquel   Anxiety and depression Restless leg syndrome Continue Lexapro.  Ropinirole    GERD Continue pantoprazole 40 mg daily.   Obesity BMI 34.03 kg/m2 Diet and exercise discussed in detail.  Suicidal ideations: Patient reports to the nurse that he does not want to live anymore. Psych consulted, Patient put on one-to-one observation. Patient denies any intention of harming himself or others today morning. One-to-one sitter discontinued.  Awaiting psych evaluation.   DVT prophylaxis: Lovenox. Code Status: Full code. Family Communication: No family at bed side. Disposition Plan:   Status is: Inpatient Remains inpatient appropriate because:   Admitted for acute hypoxic respiratory failure secondary to pneumonia and getting IV antibiotics.  Patient still appears deconditioned and hypoxic.  PT recommended SNF.  One-to-one observation discontinued.  Awaiting psych evaluation. Anticipated discharge to SNF in 1 to 2 days.    Consultants:  PCCM  Procedures: Antimicrobials:   Anti-infectives (From admission, onward)    Start     Dose/Rate Route Frequency Ordered Stop   10/20/21 0845  ceFEPIme (MAXIPIME) 2 g in sodium chloride 0.9 % 100 mL IVPB        2 g 200 mL/hr over 30 Minutes Intravenous Every 8 hours 10/20/21 0758 10/24/21 2159   10/20/21 0300  vancomycin (VANCOCIN) IVPB 1000 mg/200 mL premix  Status:  Discontinued        1,000 mg 200 mL/hr over 60 Minutes Intravenous Every 12 hours 10/19/21 1350 10/22/21 1033   10/19/21 1845  micafungin (MYCAMINE) 100 mg in sodium chloride 0.9 % 100 mL IVPB  Status:  Discontinued        100 mg 105 mL/hr over 1 Hours Intravenous Every 24 hours 10/19/21 1831 10/19/21 1850   10/19/21 1400  ceFEPIme (MAXIPIME) 2 g in sodium chloride 0.9 % 100 mL IVPB        2 g 200 mL/hr over 30 Minutes Intravenous  Once 10/19/21 1339 10/19/21 1738   10/19/21 1400  vancomycin (VANCOCIN) 2,250 mg in sodium chloride 0.9 % 500 mL IVPB        2,250 mg 261.3 mL/hr over 120 Minutes Intravenous  Once 10/19/21 1350 10/19/21 2010         Subjective: Patient was seen and examined at bedside.  Overnight events noted. Patient reports feeling better.  He denies any intentions of harming himself or others. Patient reports that he was depressed yesterday, stated to the nurse but has no real intention. He denies any chest pain, dizziness, palpitations.  Objective: Vitals:   10/25/21 0348 10/25/21 0800 10/25/21 0814 10/25/21 0925  BP: 127/74 107/63    Pulse: 99 100  (!) 104  Resp: 15 15    Temp: 98.1 F (36.7 C) 98.6 F (37 C)    TempSrc: Oral Oral    SpO2: 95% 97% 93%   Weight:      Height:        Intake/Output Summary (Last 24 hours) at 10/25/2021 1025 Last data filed at 10/25/2021 0942 Gross per 24 hour  Intake 660 ml  Output 1150 ml  Net -490 ml   Filed Weights   10/20/21 0300 10/22/21 0429 10/24/21 0441  Weight: 106.2 kg 107.1 kg 106.9 kg    Examination:  General exam: Appears comfortable, not in any acute distress.  Deconditioned Respiratory system: CTA bilaterally, respiratory effort normal, no accessory muscle use. Cardiovascular system: S1 & S2 heard, regular rate and rhythm, no murmur. Gastrointestinal system: Abdomen is soft, non tender, non  distended, BS+ Central nervous system: Alert and oriented x 3. No focal neurological deficits. Extremities: No edema, no cyanosis, no clubbing. Skin: No rashes, lesions or ulcers Psychiatry: Judgement and insight appear normal. Mood & affect appropriate.     Data Reviewed: I have personally reviewed following labs and imaging studies  CBC: Recent Labs  Lab 10/19/21 1200 10/19/21 1228 10/20/21 0456 10/21/21 0033 10/23/21 0105  WBC 15.9*  --  12.0* 12.2* 10.7*  NEUTROABS 13.4*  --   --   --   --   HGB 11.0* 11.6* 10.5* 10.4* 9.7*  HCT 34.8* 34.0* 33.6* 33.3* 31.0*  MCV 101.8*  --  104.0* 102.5* 100.3*  PLT 204  --  187 217 200   Basic Metabolic Panel: Recent Labs  Lab 10/21/21 0033 10/22/21 0128 10/23/21 0105 10/24/21 0353 10/25/21 0404   NA 139 135 137 135 138  K 4.6 5.0 4.6 4.5 4.1  CL 101 101 103 100 104  CO2 26 24 27 27 28   GLUCOSE 150* 149* 121* 93 91  BUN 26* 32* 25* 21 19  CREATININE 1.40* 1.27* 1.05 1.01 0.90  CALCIUM 9.0 8.8* 9.1 9.2 8.8*  MG 1.8 2.0 2.1 2.1 2.1   GFR: Estimated Creatinine Clearance: 100.8 mL/min (by C-G formula based on SCr of 0.9 mg/dL). Liver Function Tests: Recent Labs  Lab 10/19/21 1200  AST 36  ALT 41  ALKPHOS 76  BILITOT 0.8  PROT 7.1  ALBUMIN 2.8*   No results for input(s): "LIPASE", "AMYLASE" in the last 168 hours. No results for input(s): "AMMONIA" in the last 168 hours. Coagulation Profile: No results for input(s): "INR", "PROTIME" in the last 168 hours. Cardiac Enzymes: No results for input(s): "CKTOTAL", "CKMB", "CKMBINDEX", "TROPONINI" in the last 168 hours. BNP (last 3 results) Recent Labs    03/18/21 1004  PROBNP 370   HbA1C: No results for input(s): "HGBA1C" in the last 72 hours. CBG: No results for input(s): "GLUCAP" in the last 168 hours. Lipid Profile: No results for input(s): "CHOL", "HDL", "LDLCALC", "TRIG", "CHOLHDL", "LDLDIRECT" in the last 72 hours. Thyroid Function Tests: No results for input(s): "TSH", "T4TOTAL", "FREET4", "T3FREE", "THYROIDAB" in the last 72 hours. Anemia Panel: No results for input(s): "VITAMINB12", "FOLATE", "FERRITIN", "TIBC", "IRON", "RETICCTPCT" in the last 72 hours.  Sepsis Labs: Recent Labs  Lab 10/19/21 1200 10/20/21 0442  PROCALCITON  --  <0.10  LATICACIDVEN 1.3 1.0    Recent Results (from the past 240 hour(s))  Respiratory (~20 pathogens) panel by PCR     Status: None   Collection Time: 10/19/21 10:33 PM   Specimen: Urine, Clean Catch; Respiratory  Result Value Ref Range Status   Adenovirus NOT DETECTED NOT DETECTED Final   Coronavirus 229E NOT DETECTED NOT DETECTED Final    Comment: (NOTE) The Coronavirus on the Respiratory Panel, DOES NOT test for the novel  Coronavirus (2019 nCoV)    Coronavirus HKU1  NOT DETECTED NOT DETECTED Final   Coronavirus NL63 NOT DETECTED NOT DETECTED Final   Coronavirus OC43 NOT DETECTED NOT DETECTED Final   Metapneumovirus NOT DETECTED NOT DETECTED Final   Rhinovirus / Enterovirus NOT DETECTED NOT DETECTED Final   Influenza A NOT DETECTED NOT DETECTED Final   Influenza B NOT DETECTED NOT DETECTED Final   Parainfluenza Virus 1 NOT DETECTED NOT DETECTED Final   Parainfluenza Virus 2 NOT DETECTED NOT DETECTED Final   Parainfluenza Virus 3 NOT DETECTED NOT DETECTED Final   Parainfluenza Virus 4 NOT DETECTED NOT DETECTED Final  Respiratory Syncytial Virus NOT DETECTED NOT DETECTED Final   Bordetella pertussis NOT DETECTED NOT DETECTED Final   Bordetella Parapertussis NOT DETECTED NOT DETECTED Final   Chlamydophila pneumoniae NOT DETECTED NOT DETECTED Final   Mycoplasma pneumoniae NOT DETECTED NOT DETECTED Final    Comment: Performed at [facility], [address] [place], [place] [address]  MRSA Next Gen by PCR, Nasal     Status: None   Collection Time: 10/20/21  2:59 AM   Specimen: Nasal Mucosa; Nasal Swab  Result Value Ref Range Status   MRSA by PCR Next Gen NOT DETECTED NOT DETECTED Final    Comment: (NOTE) The GeneXpert MRSA Assay (FDA approved for NASAL specimens only), is one component of a comprehensive MRSA colonization surveillance program. It is not intended to diagnose MRSA infection nor to guide or monitor treatment for MRSA infections. Test performance is not FDA approved in patients less than 71 years old. Performed at [facility], [address], [place], [place] [address]    Radiology Studies: No results found.  Scheduled Meds:  allopurinol  100 mg Oral Daily   arformoterol  15 mcg Nebulization BID   aspirin EC  81 mg Oral Daily   atorvastatin  80 mg Oral Daily   budesonide (PULMICORT) nebulizer solution  0.25 mg Nebulization BID   Chlorhexidine Gluconate Cloth  6 each Topical Daily   clopidogrel  75 mg Oral Daily    enoxaparin (LOVENOX) injection  55 mg Subcutaneous Q24H   escitalopram  30 mg Oral Daily   guaiFENesin  600 mg Oral BID   ipratropium-albuterol  3 mL Nebulization BID   latanoprost  1 drop Both Eyes QHS   magnesium oxide  400 mg Oral Daily   memantine  10 mg Oral BID   metoprolol tartrate  37.5 mg Oral BID   midodrine  5 mg Oral TID WC   pantoprazole  40 mg Oral QPM   polyethylene glycol  17 g Oral BID   [START ON 10/26/2021] predniSONE  30 mg Oral Q breakfast   primidone  100 mg Oral BID   QUEtiapine  100 mg Oral QHS   ranolazine  1,000 mg Oral BID   rOPINIRole  4 mg Oral QHS   sodium chloride flush  3 mL Intravenous Q12H   Continuous Infusions:     LOS: 6 days    Time spent: 35 mins    Temiloluwa Laredo, MD [facility]   If 7PM-7AM, please contact night-coverage

## 2021-10-26 DIAGNOSIS — J189 Pneumonia, unspecified organism: Secondary | ICD-10-CM | POA: Diagnosis not present

## 2021-10-26 LAB — CBC
HCT: 32.3 % — ABNORMAL LOW (ref 39.0–52.0)
Hemoglobin: 10.7 g/dL — ABNORMAL LOW (ref 13.0–17.0)
MCH: 32.1 pg (ref 26.0–34.0)
MCHC: 33.1 g/dL (ref 30.0–36.0)
MCV: 97 fL (ref 80.0–100.0)
Platelets: 206 10*3/uL (ref 150–400)
RBC: 3.33 MIL/uL — ABNORMAL LOW (ref 4.22–5.81)
RDW: 14.7 % (ref 11.5–15.5)
WBC: 12.3 10*3/uL — ABNORMAL HIGH (ref 4.0–10.5)
nRBC: 0 % (ref 0.0–0.2)

## 2021-10-26 LAB — BASIC METABOLIC PANEL
Anion gap: 9 (ref 5–15)
BUN: 22 mg/dL (ref 8–23)
CO2: 26 mmol/L (ref 22–32)
Calcium: 9.1 mg/dL (ref 8.9–10.3)
Chloride: 103 mmol/L (ref 98–111)
Creatinine, Ser: 0.97 mg/dL (ref 0.61–1.24)
GFR, Estimated: >60 mL/min (ref >60)
Glucose, Bld: 112 mg/dL — ABNORMAL HIGH (ref 70–99)
Potassium: 4 mmol/L (ref 3.5–5.1)
Sodium: 138 mmol/L (ref 135–145)

## 2021-10-26 LAB — MAGNESIUM: Magnesium: 2 mg/dL (ref 1.7–2.4)

## 2021-10-26 MED ORDER — OXYCODONE-ACETAMINOPHEN 5-325 MG PO TABS
1.0000 | ORAL_TABLET | Freq: Two times a day (BID) | ORAL | 0 refills | Status: AC | PRN
Start: 2021-10-26 — End: ?

## 2021-10-26 MED ORDER — METOPROLOL TARTRATE 37.5 MG PO TABS: 37.5000 mg | ORAL_TABLET | Freq: Two times a day (BID) | ORAL | 1 refills | Status: AC

## 2021-10-26 NOTE — Progress Notes (Addendum)
Patient had a fall while obtaining a standing weight. Nurse standing next to patient when patient was witnessed falling back onto floor.  Assessment conducted. No deficits or injury noted. Patient did not hit head on floor/bed. Patient assisted to standing position by 2 staff members and ambulated back to bed.   Dr. Imogene Burn notified. Charge nurse Shanda Bumps notified.

## 2021-10-26 NOTE — TOC Progression Note (Signed)
Transition of Care [facility]) - Progression Note    Patient Details  Name: Mark Thomas MRN: [id number] Date of Birth: [birth year]  Transition of Care [facility]) CM/SW Contact  Ivette Loyal, [place] Phone Number: 10/26/2021, 12:36 PM  Clinical Narrative:    CSW spoke with pt spouse about pt DC plan being back to [place]. Pt spouse agreed and asked for PTAR for transportation, she informed CSW that pt is weak and high fall risk.    Expected Discharge Plan: Skilled Nursing Facility Barriers to Discharge: Continued Medical Work up  Expected Discharge Plan and Services Expected Discharge Plan: Skilled Nursing Facility In-house Referral: Clinical Social Work   Post Acute Care Choice: Skilled Nursing Facility Living arrangements for the past 2 months: Skilled Nursing Facility Expected Discharge Date: 10/26/21                                     Social Determinants of Health (SDOH) Interventions    Readmission Risk Interventions    10/12/2021    3:48 PM  Readmission Risk Prevention Plan  Transportation Screening Complete  Medication Review (RN Care Manager) Complete  PCP or Specialist appointment within 3-5 days of discharge Complete  HRI or Home Care Consult Complete  SW Recovery Care/Counseling Consult Complete  Palliative Care Screening Not Applicable  Skilled Nursing Facility Complete

## 2021-10-26 NOTE — TOC Transition Note (Signed)
Transition of Care [facility]) - CM/SW Discharge Note   Patient Details  Name: DALLES SAUCER MRN: [id number] Date of Birth: 03-17-1951  [facility]) CM/SW Contact:  Lynett Grimes Phone Number: 10/26/2021, 11:46 AM   Clinical Narrative:    Patient will DC to: [place] Anticipated DC date: 10/26/2021 Family notified: Pt spouse Transport by: Sharin Mons  Per MD patient ready for DC to [facility] room [phone]. RN to call report prior to discharge [phone]). RN, patient, patient's family, and facility notified of DC. Discharge Summary and FL2 sent to facility. DC packet on chart. Ambulance transport requested for patient.   CSW will sign off for now as social work intervention is no longer needed. Please consult [place] again if new needs arise.     Final next level of care: Skilled Nursing Facility Barriers to Discharge: Continued Medical Work up   Patient Goals and CMS Choice Patient states their goals for this hospitalization and ongoing recovery are:: Rehab CMS Medicare.gov Compare Post Acute Care list provided to:: Patient Choice offered to / list presented to : Patient  Discharge Placement                       Discharge Plan and Services In-house Referral: Clinical Social Work   Post Acute Care Choice: Skilled Nursing Facility                               Social Determinants of Health (SDOH) Interventions     Readmission Risk Interventions    10/12/2021    3:48 PM  Readmission Risk Prevention Plan  Transportation Screening Complete  Medication Review (RN Care Manager) Complete  PCP or Specialist appointment within 3-5 days of discharge Complete  HRI or Home Care Consult Complete  SW Recovery Care/Counseling Consult Complete  Palliative Care Screening Not Applicable  Skilled Nursing Facility Complete

## 2021-10-26 NOTE — Consult Note (Signed)
[facility] New Face-to-Face Psychiatric Evaluation   Service Date: October 26, 2021 LOS:  LOS: 7 days    Assessment  Mark Thomas is a 71 y.o. male admitted medically on 10/19/2021 10:15 AM for recurrent pneumonia. He carries the psychiatric diagnoses of moderate MDD and has a past medical history of vascular dementia, orthostatic hypotension, and recurrent pneumonia. Psychiatry was consulted for suicidal ideation by Shawna Clamp.   Major depressive disorder, recurrent, severe The patient is experiencing acute on chronic depression in the context of his serious medical illness. He minimizes previous mental health problems, but his wife reports a long history of heavy alcohol use and misuse of prescribed benzodiazepines. The patient reports his most troublesome symptom is anxiety but has difficulty articulating why or how this presents in his life. His mental status exam reveals significant deficits in attention and memory.   Regarding safety, it appears the patient expressed the desire to not be alive anymore to a nurse 6/27. Despite this, the patient does not have any active plans to harm himself and does not appear to be a danger to himself in the hospital setting. No need for a 1:1. Patient would benefit from continued OP f/u with his psychiatrist (Dr. De Nurse) and daily programming such as PHP or adult day activities.   6/27: patient engages briefly in solution oriented psychotherapy but then reverts to saying "I want to go home" in response to questions. No acute safety concerns; patient continues to deny SI and is not an acute safety concern.   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 This decision is based on my review of the chart including patient's history and current presentation, interview of the patient, mental status examination, and consideration of suicide risk including evaluating suicidal ideation, plan, intent, suicidal or self-harm behaviors, risk factors, and protective factors. This judgment is based on our ability to directly address suicide risk, implement suicide prevention strategies and develop a safety plan while the patient is in the clinical setting. Please contact our team if there is a concern that risk level has changed.   ## Medications:  --Continue current medications at present dosages --Can further discuss medication changes with patient and family  ## Medical Decision Making Capacity:  Not formally assessed   ## Further Work-up:  -- most recent EKG on 6/20 had QtC of 482 -- Pertinent labwork reviewed earlier this admission includes: NA  ## Disposition:  --SNF  ##Legal Status VOLUNTARY  Thank you for this consult request. Recommendations have been communicated to the primary team.  We will follow at this  time.   [name], MD   NEW history  Relevant Aspects of Hospital Course:  Admitted on 10/19/2021 for recurrent pneumonia. Still on O2.   Patient Report:  On interview this morning, the patient is tearful when  discussing his mental health.  He reports that he is "disconnected" from his family and feels that he is "hurting them".  He initially reports that a lack of sleep this is most bothersome symptom, but later changes this and says that anxiety is his most pressing issue.  (Of note, the patient is fully alert and oriented but exhibits severe deficits with regard to attention).  He reports having depression previous to his medical illnesses but states that he has never been on antidepressant medication before taking Lexapro.  He states that he "just used pain meds".  He reports engaging in some therapy 3 to 4 years ago but did not find this helpful.  He denies ever attempting suicide or engaging in self-injurious behavior.  He is unable to recollect a previous psychiatric hospitalization.  He reports strong religious believes against suicide.  He denies present suicidal thoughts.  He denies homicidal thoughts or auditory/visual hallucinations.  He reports feeling joy from being with his family and shooting pool.  Of note, at one point the patient reports that he has 2 male children, at which time the wife shakes her head in a disapproving gesture.  It is recommended to the patient that the psychiatry team speak with his wife alone outside the room.  The patient is amenable to this.  The patient's wife reports that much of what the patient stated is incorrect.  She reports that the patient has had depression essentially his whole life.  And that he has dealt with the depression with alcohol, medication to help himself sleep all day as well as the missed use of benzodiazepines.  She reports that he got sober when [place] hit.   6/27 The patient initially states that he does not want to speak with the psychiatry team today.  However, with persistent attempts to engage the patient, he becomes interested in talking and opens up.  He states "I just want to go home".  He says this over and over again throughout the  interview.  When asked what he likes about home, the patient states that he has a Restaurant manager, fast food whom he loves and that he has his family visit frequently, specifically his sons.  There is difficult to engage the patient in any sort of cognitive therapy relating to why home might not be a good choice for him.  The patient denies any form of suicidal thoughts, homicidal thoughts, and auditory/visual hallucinations.  The patient ends up talking about how he worked with physical therapy today.  Later, outside of the room, the patient's wife reports that this is not true.  She also provides more background history about the patient.  She states that the patient was influenced by end of life events of his father.  The patient feels that the family "abandoned" his father at the age of 44 years old when he was suffering from medical illness.  The patient's wife reports that this involved the family putting him in a nursing home for higher level of care.  Nonetheless, this deeply influenced the patient, and his wife feels that this is contributing to his demoralization.  He reportedly asked his wife if she was going to do the same thing to him.   ROS:  As above  Collateral  information:  See conversation with wife above   Psychiatric History:  Information collected from patient, chart review, patient's wife   Social History:   Tobacco use: none Alcohol use: heavy use for many years, now sober Drug use: misuse of prescription drugs  Family History:  The patient's family history includes Hypertension in his father and mother; Stroke in his father and mother.  Medical History: Past Medical History:  Diagnosis Date   Anxiety    Coronary artery disease    Depression    Essential tremor    GERD (gastroesophageal reflux disease)    High cholesterol    Hypertension    Myocardial infarct (HCC)    Orthostatic hypotension    Stroke ([place])    Vascular dementia [facility])     Surgical History: Past  Surgical History:  Procedure Laterality Date   APPENDECTOMY     CARDIAC SURGERY     CHOLECYSTECTOMY     FLEXIBLE BRONCHOSCOPY N/A 10/06/2021   Procedure: FLEXIBLE BRONCHOSCOPY;  Surgeon: Margaretha Seeds, MD;  Location: [facility];  Service: Cardiopulmonary;  Laterality: N/A;   HERNIA REPAIR     NASAL SINUS SURGERY     SHOULDER SURGERY      Medications:   Current Facility-Administered Medications:    acetaminophen (TYLENOL) tablet 650 mg, 650 mg, Oral, Q6H PRN, 650 mg at 10/26/21 8182 **OR** acetaminophen (TYLENOL) suppository 650 mg, 650 mg, Rectal, Q6H PRN, Tamala Julian, Rondell A, MD   albuterol (PROVENTIL) (2.5 MG/3ML) 0.083% nebulizer solution 2.5 mg, 2.5 mg, Nebulization, Q4H PRN, Tamala Julian, Rondell A, MD   allopurinol (ZYLOPRIM) tablet 100 mg, 100 mg, Oral, Daily, Smith, Rondell A, MD, 100 mg at 10/26/21 0938   arformoterol (BROVANA) nebulizer solution 15 mcg, 15 mcg, Nebulization, BID, Freda Jackson B, MD, 15 mcg at 10/26/21 0734   aspirin EC tablet 81 mg, 81 mg, Oral, Daily, Smith, Rondell A, MD, 81 mg at 10/26/21 0938   atorvastatin (LIPITOR) tablet 80 mg, 80 mg, Oral, Daily, Smith, Rondell A, MD, 80 mg at 10/26/21 0938   budesonide (PULMICORT) nebulizer solution 0.25 mg, 0.25 mg, Nebulization, BID, Freda Jackson B, MD, 0.25 mg at 10/26/21 0734   clopidogrel (PLAVIX) tablet 75 mg, 75 mg, Oral, Daily, Smith, Rondell A, MD, 75 mg at 10/26/21 0938   enoxaparin (LOVENOX) injection 55 mg, 55 mg, Subcutaneous, Q24H, Smith, Rondell A, MD, 55 mg at 10/25/21 2123   escitalopram (LEXAPRO) tablet 30 mg, 30 mg, Oral, Daily, Smith, Rondell A, MD, 30 mg at 10/26/21 0938   guaiFENesin (MUCINEX) 12 hr tablet 600 mg, 600 mg, Oral, BID, Smith, Rondell A, MD, 600 mg at 10/26/21 0938   guaiFENesin (ROBITUSSIN) 100 MG/5ML liquid 5 mL, 5 mL, Oral, Q4H PRN, Amin, Ankit Chirag, MD, 5 mL at 10/25/21 1957   hydrALAZINE (APRESOLINE) injection 10 mg, 10 mg, Intravenous, Q4H PRN, Amin, Ankit Chirag, MD   latanoprost  (XALATAN) 0.005 % ophthalmic solution 1 drop, 1 drop, Both Eyes, QHS, Amin, Ankit Chirag, MD, 1 drop at 10/25/21 2124   magnesium oxide (MAG-OX) tablet 400 mg, 400 mg, Oral, Daily, Smith, Rondell A, MD, 400 mg at 10/26/21 0938   memantine (NAMENDA) tablet 10 mg, 10 mg, Oral, BID, Smith, Rondell A, MD, 10 mg at 10/26/21 0938   metoprolol tartrate (LOPRESSOR) injection 5 mg, 5 mg, Intravenous, Q4H PRN, Amin, Ankit Chirag, MD   metoprolol tartrate (LOPRESSOR) tablet 37.5 mg, 37.5 mg, Oral, BID, Smith, Rondell A, MD, 37.5 mg at 10/26/21 0938   midodrine (PROAMATINE) tablet  5 mg, 5 mg, Oral, TID WC, Amin, Ankit Chirag, MD, 5 mg at 10/26/21 1250   Oral care mouth rinse, 15 mL, Mouth Rinse, PRN, Tamala Julian, Rondell A, MD   oxyCODONE-acetaminophen (PERCOCET/ROXICET) 5-325 MG per tablet 1 tablet, 1 tablet, Oral, Q12H PRN, Shawna Clamp, MD, 1 tablet at 10/26/21 1250   pantoprazole (PROTONIX) EC tablet 40 mg, 40 mg, Oral, QPM, Smith, Rondell A, MD, 40 mg at 10/25/21 1729   polyethylene glycol (MIRALAX / GLYCOLAX) packet 17 g, 17 g, Oral, BID, Smith, Rondell A, MD, 17 g at 10/26/21 0349   predniSONE (DELTASONE) tablet 30 mg, 30 mg, Oral, Q breakfast, 30 mg at 10/26/21 0609 **FOLLOWED BY** [START ON 11/02/2021] predniSONE (DELTASONE) tablet 20 mg, 20 mg, Oral, Q breakfast **FOLLOWED BY** [START ON 11/09/2021] predniSONE (DELTASONE) tablet 10 mg, 10 mg, Oral, Q breakfast, Shawna Clamp, MD   primidone (MYSOLINE) tablet 100 mg, 100 mg, Oral, BID, Tamala Julian, Rondell A, MD, 100 mg at 10/26/21 1791   QUEtiapine (SEROQUEL) tablet 100 mg, 100 mg, Oral, QHS, Smith, Rondell A, MD, 100 mg at 10/25/21 2120   ranolazine (RANEXA) 12 hr tablet 1,000 mg, 1,000 mg, Oral, BID, Tamala Julian, Rondell A, MD, 1,000 mg at 10/26/21 5056   rOPINIRole (REQUIP XL) 24 hr tablet 4 mg, 4 mg, Oral, QHS, Smith, Rondell A, MD, 4 mg at 10/25/21 2120   senna-docusate (Senokot-S) tablet 1 tablet, 1 tablet, Oral, QHS PRN, Amin, Ankit Chirag, MD   sodium chloride  flush (NS) 0.9 % injection 3 mL, 3 mL, Intravenous, Q12H, Smith, Rondell A, MD, 3 mL at 10/26/21 9794  Current Outpatient Medications:    acetaminophen (TYLENOL) 500 MG tablet, Take 500 mg by mouth every 6 (six) hours as needed for mild pain., Disp: , Rfl:    albuterol (PROVENTIL) (2.5 MG/3ML) 0.083% nebulizer solution, Take 3 mLs (2.5 mg total) by nebulization every 6 (six) hours as needed for wheezing or shortness of breath. (Patient taking differently: Take 2.5 mg by nebulization every 4 (four) hours as needed for shortness of breath.), Disp: 75 mL, Rfl: 12   albuterol (VENTOLIN HFA) 108 (90 Base) MCG/ACT inhaler, Inhale 2 puffs into the lungs every 6 (six) hours as needed for wheezing or shortness of breath., Disp: , Rfl:    allopurinol (ZYLOPRIM) 100 MG tablet, Take 100 mg by mouth daily., Disp: , Rfl:    aspirin 81 MG EC tablet, Take 81 mg by mouth daily., Disp: , Rfl:    atorvastatin (LIPITOR) 80 MG tablet, Take 80 mg by mouth daily., Disp: , Rfl:    b complex vitamins capsule, Take 1 capsule by mouth daily., Disp: , Rfl:    benzonatate (TESSALON) 100 MG capsule, Take 1 capsule (100 mg total) by mouth 3 (three) times daily as needed for cough., Disp: 20 capsule, Rfl: 0   Cholecalciferol (VITAMIN D3) 10 MCG (400 UNIT) CAPS, Take 400 Units by mouth in the morning., Disp: , Rfl:    clopidogrel (PLAVIX) 75 MG tablet, Take 75 mg by mouth daily., Disp: , Rfl:    escitalopram (LEXAPRO) 20 MG tablet, Take 1.5 tablets (30 mg total) by mouth daily., Disp: 45 tablet, Rfl: 0   ferrous sulfate 325 (65 FE) MG EC tablet, Take 1 tablet (325 mg total) by mouth in the morning and at bedtime., Disp: 180 tablet, Rfl: 1   furosemide (LASIX) 40 MG tablet, Take 40 mg by mouth See admin instructions. One time, give 1 tablet for fluid build up., Disp: , Rfl:  guaiFENesin (MUCINEX) 600 MG 12 hr tablet, Take 1 tablet (600 mg total) by mouth 2 (two) times daily., Disp: , Rfl:    ipratropium-albuterol (DUONEB) 0.5-2.5  (3) MG/3ML SOLN, Take 3 mLs by nebulization every 8 (eight) hours., Disp: , Rfl:    latanoprost (XALATAN) 0.005 % ophthalmic solution, Place 1 drop into both eyes at bedtime., Disp: , Rfl:    magnesium oxide (MAG-OX) 400 MG tablet, Take 400 mg by mouth daily., Disp: , Rfl:    memantine (NAMENDA) 10 MG tablet, TAKE 1 TABLET TWICE A DAY (Patient taking differently: Take 10 mg by mouth 2 (two) times daily.), Disp: 180 tablet, Rfl: 1   midodrine (PROAMATINE) 5 MG tablet, Take 0.5 tablets (2.5 mg total) by mouth 3 (three) times daily with meals., Disp: 270 tablet, Rfl: 3   mometasone-formoterol (DULERA) 200-5 MCG/ACT AERO, Inhale 2 puffs into the lungs 2 (two) times daily., Disp: 1 each, Rfl: 0   nitroGLYCERIN (NITROLINGUAL) 0.4 MG/SPRAY spray, Place 1 spray under the tongue every 5 (five) minutes x 3 doses as needed for chest pain., Disp: , Rfl:    pantoprazole (PROTONIX) 40 MG tablet, Take 1 tablet (40 mg total) by mouth every evening., Disp: 90 tablet, Rfl: 3   polyethylene glycol (MIRALAX / GLYCOLAX) 17 g packet, Take 17 g by mouth 2 (two) times daily., Disp: 14 each, Rfl: 0   predniSONE (DELTASONE) 20 MG tablet, 40 mg p.o. daily for 1 week, then 30 mg p.o. daily for 1 week, 20 mg p.o. daily for 1 week, 10 mg p.o. daily for 1 week and then stop., Disp: , Rfl:    primidone (MYSOLINE) 50 MG tablet, Take 2 tablets (100 mg total) by mouth 2 (two) times daily., Disp: 360 tablet, Rfl: 0   QUEtiapine (SEROQUEL) 50 MG tablet, Take 2 tablets (100 mg total) by mouth at bedtime., Disp: 180 tablet, Rfl: 0   ranolazine (RANEXA) 1000 MG SR tablet, Take 1,000 mg by mouth in the morning and at bedtime., Disp: , Rfl:    rOPINIRole (REQUIP XL) 4 MG 24 hr tablet, Take 1 tablet (4 mg total) by mouth at bedtime., Disp: 90 tablet, Rfl: 3   triamcinolone ointment (KENALOG) 0.1 %, Apply 1 application. topically daily as needed (to affected areas- for psoriasis flares)., Disp: , Rfl:    Metoprolol Tartrate 37.5 MG TABS, Take  37.5 mg by mouth 2 (two) times daily., Disp: 60 tablet, Rfl: 1   oxyCODONE-acetaminophen (PERCOCET/ROXICET) 5-325 MG tablet, Take 1 tablet by mouth every 12 (twelve) hours as needed for moderate pain., Disp: 10 tablet, Rfl: 0  Allergies: Allergies  Allergen Reactions   Dilaudid [Hydromorphone] Other (See Comments)    Respiratory Arrest!!!!   Bactrim [Sulfamethoxazole-Trimethoprim] Rash   Benadryl [Diphenhydramine] Other (See Comments)    Muscle spasms   Phenobarbital Other (See Comments)    From childhood- reaction not recalled at this time   Penicillins Other (See Comments)    From childhood- reaction not recalled at this time       Objective  Vital signs:  Temp:  [98.1 F (36.7 C)-98.8 F (37.1 C)] 98.7 F (37.1 C) (06/27 1128) Pulse Rate:  [86-102] 97 (06/27 1128) Resp:  [14-21] 16 (06/27 1128) BP: (108-147)/(70-98) 132/78 (06/27 1128) SpO2:  [93 %-100 %] 97 % (06/27 1128) Weight:  [105.6 kg] 105.6 kg (06/27 0341)  Psychiatric Specialty Exam:  Presentation  General Appearance: Disheveled Eye Contact:Poor Speech:Clear and Coherent Speech Volume:Normal Handedness:No data recorded  Mood and Affect  Mood:Depressed Affect:Congruent  Thought Process  Thought Processes:Coherent Descriptions of Associations:Intact  Orientation:Full (Time, Place and Person)  Thought Content:Logical  History of Schizophrenia/Schizoaffective disorder:No data recorded Duration of Psychotic Symptoms:No data recorded Hallucinations:Hallucinations: None  Ideas of Reference:None  Suicidal Thoughts:Suicidal Thoughts: No  Homicidal Thoughts:Homicidal Thoughts: No   Sensorium  Memory:Immediate Poor; Recent Poor; Remote Poor Judgment:Poor Insight:Poor  Executive Functions  Concentration:Poor Attention Span:Poor Recall:Poor Fund of Knowledge:Fair Language:Fair  Psychomotor Activity  Psychomotor Activity:Psychomotor Activity: Normal  Assets  Assets:Housing; Social  Support  Sleep  Sleep:Sleep: Fair   Physical Exam: Physical Exam Constitutional:      Appearance: the patient is not toxic-appearing.  Pulmonary:     Effort: Pulmonary effort is normal.  Neurological:     General: No focal deficit present.     Mental Status: the patient is alert and oriented to person, place, and time.   Review of Systems  Respiratory:  Negative for shortness of breath.   Cardiovascular:  Negative for chest pain.  Gastrointestinal:  Negative for abdominal pain, constipation, diarrhea, nausea and vomiting.  Neurological:  Negative for headaches.   Blood pressure 132/78, pulse 97, temperature 98.7 F (37.1 C), temperature source Oral, resp. rate 16, height _0  (1.854 m), weight 105.6 kg, SpO2 97 %. Body mass index is 30.7 kg/m.   [name], MD PGY-1

## 2021-10-28 ENCOUNTER — Ambulatory Visit: Payer: Medicare Other | Admitting: Cardiology

## 2021-11-04 ENCOUNTER — Encounter (HOSPITAL_COMMUNITY): Payer: Self-pay | Admitting: Psychiatry

## 2021-11-04 ENCOUNTER — Telehealth (INDEPENDENT_AMBULATORY_CARE_PROVIDER_SITE_OTHER): Payer: Medicare Other | Admitting: Psychiatry

## 2021-11-04 ENCOUNTER — Telehealth: Payer: Medicare Other | Admitting: Neurology

## 2021-11-04 DIAGNOSIS — F411 Generalized anxiety disorder: Secondary | ICD-10-CM | POA: Diagnosis not present

## 2021-11-04 DIAGNOSIS — F4323 Adjustment disorder with mixed anxiety and depressed mood: Secondary | ICD-10-CM

## 2021-11-04 DIAGNOSIS — F331 Major depressive disorder, recurrent, moderate: Secondary | ICD-10-CM | POA: Diagnosis not present

## 2021-11-04 MED ORDER — ESCITALOPRAM OXALATE 20 MG PO TABS: 20.0000 mg | ORAL_TABLET | Freq: Every day | ORAL | 0 refills | Status: AC

## 2021-11-04 MED ORDER — QUETIAPINE FUMARATE 50 MG PO TABS: 100.0000 mg | ORAL_TABLET | Freq: Every evening | ORAL | 0 refills | Status: AC

## 2021-11-04 NOTE — Progress Notes (Signed)
[facility] Follow up visit  Patient Identification: Mark Thomas MRN:  [id number] Date of Evaluation:  11/04/2021 Referral Source: pcp Chief Complaint:  establish care, depression Visit Diagnosis:    ICD-10-CM   1. Moderate episode of recurrent major depressive disorder (HCC)  F33.1     2. Anxiety state  F41.1     3. Adjustment disorder with mixed anxiety and depressed mood  F43.23       Virtual Visit via Telephone Note  I connected with Mark Thomas on 11/04/21 at  9:00 AM EDT by telephone and verified that I am speaking with the correct person using two identifiers.  Location: Patient: in rehab with wife Provider: office   I discussed the limitations, risks, security and privacy concerns of performing an evaluation and management service by telephone and the availability of in person appointments. I also discussed with the patient that there may be a patient responsible charge related to this service. The patient expressed understanding and agreed to proceed.      I discussed the assessment and treatment plan with the patient. The patient was provided an opportunity to ask questions and all were answered. The patient agreed with the plan and demonstrated an understanding of the instructions.   The patient was advised to call back or seek an in-person evaluation if the symptoms worsen or if the condition fails to improve as anticipated.  I provided 20 minutes of non-face-to-face time during this encounter. Video didn't work, phone was done     History of Present Illness: Patient is a 71 years old currently married Caucasian male referred initially by primary care physician establish care for depression he has multiple medical comorbidities including history of stroke, coronary artery disease, vascular dementia  Recurrent pneumonia and now in Rehab, psych consulted while in hospital for confusion and depression Did not endorse suicidality at that time Feels rehab is  helping Lexapro is 19m,  Mood fair , wife is supportive but medical co morbidities have added distress lately  Discussed to avoid benzo and its effect on memory  Has a supportive wife   Aggravating factors; medical comorbidities including stroke. Familhy and medical stressors  Modifying factors; wife Duration 10 years plus Severity : gets subdued    Past Psychiatric History: depression, alcohol dependence  Previous Psychotropic Medications: No   Substance Abuse History in the last 12 months:  No.  Consequences of Substance Abuse: NA  Past Medical History:  Past Medical History:  Diagnosis Date   Anxiety    Coronary artery disease    Depression    Essential tremor    GERD (gastroesophageal reflux disease)    High cholesterol    Hypertension    Myocardial infarct (HCC)    Orthostatic hypotension    Stroke ([facility]    Vascular dementia ([facility]     Past Surgical History:  Procedure Laterality Date   APPENDECTOMY     CARDIAC SURGERY     CHOLECYSTECTOMY     FLEXIBLE BRONCHOSCOPY N/A 10/06/2021   Procedure: FLEXIBLE BRONCHOSCOPY;  Surgeon: EMargaretha Seeds MD;  Location: [facility]  Service: Cardiopulmonary;  Laterality: N/A;   HERNIA REPAIR     NASAL SINUS SURGERY     SHOULDER SURGERY      Family Psychiatric History: mother : anxiety  Family History:  Family History  Problem Relation Age of Onset   Hypertension Mother    Stroke Mother    Stroke Father    Hypertension Father  Social History:   Social History   Socioeconomic History   Marital status: Married    Spouse name: Mark Thomas   Number of children: 2   Years of education: 20   Highest education level: Master's degree (e.g., MA, MS, MEng, MEd, MSW, MBA)  Occupational History   Occupation: Retired  Tobacco Use   Smoking status: Never    Passive exposure: Never   Smokeless tobacco: Never  Vaping Use   Vaping Use: Never used  Substance and Sexual Activity   Alcohol use: Not Currently    Drug use: Never   Sexual activity: Not on file  Other Topics Concern   Not on file  Social History Narrative   Lives with his wife. He has vascular dementia. His wife is his primary caretaker.He enjoys watching football and spending time with his grandchildren.   Social Determinants of Health   Financial Resource Strain: Low Risk  (03/15/2021)   Overall Financial Resource Strain (CARDIA)    Difficulty of Paying Living Expenses: Not hard at all  Food Insecurity: No Food Insecurity (03/15/2021)   Hunger Vital Sign    Worried About Running Out of Food in the Last Year: Never true    Ran Out of Food in the Last Year: Never true  Transportation Needs: No Transportation Needs (03/15/2021)   PRAPARE - Hydrologist (Medical): No    Lack of Transportation (Non-Medical): No  Physical Activity: Insufficiently Active (03/15/2021)   Exercise Vital Sign    Days of Exercise per Week: 2 days    Minutes of Exercise per Session: 50 min  Stress: No Stress Concern Present (03/15/2021)   [place]    Feeling of Stress : Not at all  Social Connections: [place] (03/15/2021)   Social Connection and Isolation Panel [NHANES]    Frequency of Communication with Friends and Family: More than three times a week    Frequency of Social Gatherings with Friends and Family: Once a week    Attends Religious Services: More than 4 times per year    Active Member of [employer] or Organizations: Yes    Attends Music therapist: More than 4 times per year    Marital Status: Married    Additional Social History: grew up with parens, mom had nerve problems or anxiety Currently married, retired Marine scientist, 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, supportive wife Allergies:   Allergies  Allergen Reactions   Dilaudid [Hydromorphone] Other (See Comments)    Respiratory Arrest!!!!   Bactrim [Sulfamethoxazole-Trimethoprim] Rash    Benadryl [Diphenhydramine] Other (See Comments)    Muscle spasms   Phenobarbital Other (See Comments)    From childhood- reaction not recalled at this time   Penicillins Other (See Comments)    From childhood- reaction not recalled at this time    Metabolic Disorder Labs: Lab Results  Component Value Date   HGBA1C 6.0 (H) 01/08/2021   MPG 125.5 01/08/2021   No results found for: "PROLACTIN" Lab Results  Component Value Date   CHOL 131 01/07/2021   TRIG 152 (H) 01/07/2021   HDL 47 01/07/2021   CHOLHDL 2.8 01/07/2021   VLDL 30 01/07/2021   LDLCALC 54 01/07/2021   Lab Results  Component Value Date   TSH 3.377 10/20/2021    Therapeutic Level Labs: No results found for: "LITHIUM" No results found for: "CBMZ" No results found for: "VALPROATE"  Current Medications: Current Outpatient Medications  Medication Sig Dispense Refill  acetaminophen (TYLENOL) 500 MG tablet Take 500 mg by mouth every 6 (six) hours as needed for mild pain.     albuterol (PROVENTIL) (2.5 MG/3ML) 0.083% nebulizer solution Take 3 mLs (2.5 mg total) by nebulization every 6 (six) hours as needed for wheezing or shortness of breath. (Patient taking differently: Take 2.5 mg by nebulization every 4 (four) hours as needed for shortness of breath.) 75 mL 12   albuterol (VENTOLIN HFA) 108 (90 Base) MCG/ACT inhaler Inhale 2 puffs into the lungs every 6 (six) hours as needed for wheezing or shortness of breath.     allopurinol (ZYLOPRIM) 100 MG tablet Take 100 mg by mouth daily.     aspirin 81 MG EC tablet Take 81 mg by mouth daily.     atorvastatin (LIPITOR) 80 MG tablet Take 80 mg by mouth daily.     b complex vitamins capsule Take 1 capsule by mouth daily.     benzonatate (TESSALON) 100 MG capsule Take 1 capsule (100 mg total) by mouth 3 (three) times daily as needed for cough. 20 capsule 0   Cholecalciferol (VITAMIN D3) 10 MCG (400 UNIT) CAPS Take 400 Units by mouth in the morning.     clopidogrel (PLAVIX) 75 MG  tablet Take 75 mg by mouth daily.     escitalopram (LEXAPRO) 20 MG tablet Take 1 tablet (20 mg total) by mouth daily. 90 tablet 0   ferrous sulfate 325 (65 FE) MG EC tablet Take 1 tablet (325 mg total) by mouth in the morning and at bedtime. 180 tablet 1   furosemide (LASIX) 40 MG tablet Take 40 mg by mouth See admin instructions. One time, give 1 tablet for fluid build up.     guaiFENesin (MUCINEX) 600 MG 12 hr tablet Take 1 tablet (600 mg total) by mouth 2 (two) times daily.     ipratropium-albuterol (DUONEB) 0.5-2.5 (3) MG/3ML SOLN Take 3 mLs by nebulization every 8 (eight) hours.     latanoprost (XALATAN) 0.005 % ophthalmic solution Place 1 drop into both eyes at bedtime.     magnesium oxide (MAG-OX) 400 MG tablet Take 400 mg by mouth daily.     memantine (NAMENDA) 10 MG tablet TAKE 1 TABLET TWICE A DAY (Patient taking differently: Take 10 mg by mouth 2 (two) times daily.) 180 tablet 1   Metoprolol Tartrate 37.5 MG TABS Take 37.5 mg by mouth 2 (two) times daily. 60 tablet 1   midodrine (PROAMATINE) 5 MG tablet Take 0.5 tablets (2.5 mg total) by mouth 3 (three) times daily with meals. 270 tablet 3   mometasone-formoterol (DULERA) 200-5 MCG/ACT AERO Inhale 2 puffs into the lungs 2 (two) times daily. 1 each 0   nitroGLYCERIN (NITROLINGUAL) 0.4 MG/SPRAY spray Place 1 spray under the tongue every 5 (five) minutes x 3 doses as needed for chest pain.     oxyCODONE-acetaminophen (PERCOCET/ROXICET) 5-325 MG tablet Take 1 tablet by mouth every 12 (twelve) hours as needed for moderate pain. 10 tablet 0   pantoprazole (PROTONIX) 40 MG tablet Take 1 tablet (40 mg total) by mouth every evening. 90 tablet 3   polyethylene glycol (MIRALAX / GLYCOLAX) 17 g packet Take 17 g by mouth 2 (two) times daily. 14 each 0   predniSONE (DELTASONE) 20 MG tablet 40 mg p.o. daily for 1 week, then 30 mg p.o. daily for 1 week, 20 mg p.o. daily for 1 week, 10 mg p.o. daily for 1 week and then stop.     primidone (MYSOLINE) 50  MG  tablet Take 2 tablets (100 mg total) by mouth 2 (two) times daily. 360 tablet 0   QUEtiapine (SEROQUEL) 50 MG tablet Take 2 tablets (100 mg total) by mouth at bedtime. 180 tablet 0   ranolazine (RANEXA) 1000 MG SR tablet Take 1,000 mg by mouth in the morning and at bedtime.     rOPINIRole (REQUIP XL) 4 MG 24 hr tablet Take 1 tablet (4 mg total) by mouth at bedtime. 90 tablet 3   triamcinolone ointment (KENALOG) 0.1 % Apply 1 application. topically daily as needed (to affected areas- for psoriasis flares).     No current facility-administered medications for this visit.    Psychiatric Specialty Exam: Review of Systems  Cardiovascular:  Negative for chest pain.  Psychiatric/Behavioral:  Negative for agitation, behavioral problems, self-injury and suicidal ideas.     There were no vitals taken for this visit.There is no height or weight on file to calculate BMI.  General Appearance: Casual  Eye Contact:  Fair  Speech:  Normal Rate  Volume:  Decreased  Mood: fair,   Affect:  Constricted  Thought Process:  Goal Directed  Orientation:  Full (Time, Place, and Person)  Thought Content:  Rumination  Suicidal Thoughts:  No  Homicidal Thoughts:  No  Memory:  Immediate;   Fair  Judgement:  Fair  Insight:  Fair  Psychomotor Activity:  Decreased  Concentration:  Concentration: Fair  Recall:  [place] of Knowledge:Good  Language: Good  Akathisia:  No  Handed:    AIMS (if indicated):  has essential tremors on meds. No other inovlunaary movemetns  Assets:  Desire for Improvement Financial Resources/Insurance Housing Social Support  ADL's:  Intact  Cognition: mild decline,  Sleep:   irregular   Screenings: GAD-7    Flowsheet Row Office Visit from 01/26/2021 in [facility] Visit from 12/24/2020 in [facility]  Total GAD-7 Score 14 [place] Office Visit from 02/08/2021 in  [facility]  Total Score (max 30 points ) 30      PHQ2-9    [place] Visit from 09/06/2021 in [facility] Visit from 07/07/2021 in [facility] Visit from 03/30/2021 in [facility] Office Visit from 03/15/2021 in [facility] Office Visit from 01/26/2021 in [facility]  PHQ-2 Total Score _0 PHQ-9 Total Score -- -- _1 Flowsheet Row Video Visit from 11/04/2021 in [facility] ED to Hosp-Admission (Discharged) from 10/19/2021 in [facility] ED to Hosp-Admission (Discharged) from 10/03/2021 in [facility]  C-SSRS RISK CATEGORY Error: Q3, 4, or 5 should not be populated when Q2 is No Low Risk No Risk       Assessment and Plan: as follows  Prior documentation reviewed  Major depressive recurrent moderate; somewhat subdued but tolerating meds, continue lexparo 69m, do not increase for now considering Rehab is helping recover and get strength which helps depression Continue seroquel  Adjustment disorder with depression and anxiety; in rehab, helping to get strenh, continue meds   Anxiety about health: continue to follow up with providers, consider therapy,  Continue lexapro and seroquel for  mood and depression More to recover from rehab which will help get strength   Fu 4 -6 weeks , continue to work away from negative thoughts  Refills sent side effects reviewed     Merian Capron, MD 7/6/20239:26 AM

## 2021-11-09 LAB — FUNGUS CULTURE WITH STAIN

## 2021-11-09 LAB — FUNGUS CULTURE RESULT

## 2021-11-09 LAB — FUNGAL ORGANISM REFLEX

## 2021-11-12 ENCOUNTER — Ambulatory Visit (INDEPENDENT_AMBULATORY_CARE_PROVIDER_SITE_OTHER): Payer: Medicare Other

## 2021-11-12 ENCOUNTER — Encounter: Payer: Self-pay | Admitting: Adult Health

## 2021-11-12 ENCOUNTER — Ambulatory Visit (INDEPENDENT_AMBULATORY_CARE_PROVIDER_SITE_OTHER): Payer: Medicare Other | Admitting: Adult Health

## 2021-11-12 VITALS — BP 112/80 | HR 96 | Temp 98.4°F | Ht 73.0 in | Wt 232.0 lb

## 2021-11-12 DIAGNOSIS — J9611 Chronic respiratory failure with hypoxia: Secondary | ICD-10-CM | POA: Insufficient documentation

## 2021-11-12 DIAGNOSIS — J189 Pneumonia, unspecified organism: Secondary | ICD-10-CM

## 2021-11-12 DIAGNOSIS — I2510 Atherosclerotic heart disease of native coronary artery without angina pectoris: Secondary | ICD-10-CM | POA: Diagnosis not present

## 2021-11-12 DIAGNOSIS — I5032 Chronic diastolic (congestive) heart failure: Secondary | ICD-10-CM

## 2021-11-12 DIAGNOSIS — I5030 Unspecified diastolic (congestive) heart failure: Secondary | ICD-10-CM | POA: Insufficient documentation

## 2021-11-12 NOTE — Assessment & Plan Note (Signed)
Continue on oxygen to maintain O2 saturations greater than 88 to 90%. 

## 2021-11-12 NOTE — Assessment & Plan Note (Signed)
She does have some mild fluid retention, elevated BNP.  May have some decompensated diastolic dysfunction.  Agree with restarting Lasix.  We will need to monitor kidney function and blood pressure closely.  Plan  Patient Instructions  Increase Duoneb Three times a day   Flutter valve Three times a day  Use caution with Percocet as may increase aspiration risk  Aspiration precautions , upright 2 hrs after eating , no eating 3 hr before bedtime  Continue on Oxygen 3lm  Continue on Dulera 2 puffs Twice daily   Finish Levaquin and Prednisone as directed  Lasix as directed.  Follow up with Dr. Erin Fulling in 2-3 weeks with chest xray and As needed   Please contact office for sooner follow up if symptoms do not improve or worsen or seek emergency care

## 2021-11-12 NOTE — Addendum Note (Signed)
Addended by: Vanessa Barbara on: 11/12/2021 12:22 PM   Modules accepted: Orders

## 2021-11-12 NOTE — Assessment & Plan Note (Signed)
Patient with recurrent pneumonia with multiple hospitalizations-highly suspicious for underlying aspiration.  Swallow evaluation April 2023 showed only mild dysphagia and mild aspiration risk however patient has multiple episodes and has intermitting episodes where he feels that he is choking.  Also on multiple sedating medications and now on chronic narcotics over the last month all of which increase his risk for aspiration events.  Have discussed in detail with patient and wife.  Patient with clinical decline this past few days.  Has been started on antibiotics and steroids by rehab providers.  Chest x-ray today shows right-sided opacities which seem to be similar to the last chest x-ray.  Slightly decreased aeration on the right per my inspection. Would go ahead and complete antibiotics.  Continue with aggressive pulmonary hygiene with DuoNeb 3 times daily.  Flutter valve 3 times daily.  Mobility as able. Strict aspiration cautions with eating.  Would try to wean off of chronic narcotics if possible as these may increase his risk for aspiration with oversedation  Plan  Patient Instructions  Increase Duoneb Three times a day   Flutter valve Three times a day  Use caution with Percocet as may increase aspiration risk  Aspiration precautions , upright 2 hrs after eating , no eating 3 hr before bedtime  Continue on Oxygen 3lm  Continue on Dulera 2 puffs Twice daily   Finish Levaquin and Prednisone as directed  Lasix as directed.  Follow up with Dr. Erin Fulling in 2-3 weeks with chest xray and As needed   Please contact office for sooner follow up if symptoms do not improve or worsen or seek emergency care

## 2021-11-12 NOTE — Progress Notes (Signed)
_0  ID: Mark Thomas, male    DOB: [birth year], 71 y.o.   MRN: [id number]  Chief Complaint  Patient presents with   Hospitalization Follow-up    Referring provider: Luetta Nutting, DO  HPI: 71 year old male never smoker seen for pulmonary consult Sep 14, 2021 for ILD and recurrent pneumonia Medical history significant for A-fib status post ablation and Watchman device, sick sinus syndrome status post pacemaker, coronary disease, stroke, vascular dementia, chronic kidney disease  TEST/EVENTS :   11/12/2021 Follow up ; ILD , Recurrent pneumonia, post hospital follow-up Patient returns for a posthospital follow-up.  Patient was re-hospitalized 2 weeks ago  for acute hypoxic respiratory failure secondary to recurrent pneumonia and decompensated diastolic heart failure. Patient was treated with IV antibiotics nebulized bronchodilators and pulmonary hygiene regimen.  He was also treated with a course of steroids for asthmatic bronchitis.  2D echo in April showed EF at 70% and grade 2 diastolic dysfunction.  Patient has some decompensated diastolic heart failure.  Was given gentle diuresis.  Lasix was discontinued due to acute on chronic kidney failure. Patient does have orthostatic hypotension.  Is on midodrine 5 mg 3 times daily. Patient was hospitalized earlier last month severe recurrent pneumonia with complete opacification of the left lung.  Suspected to have an aspiration event.  He was treated with aggressive IV antibiotics and discharged on 5-day course of Levaquin. Started on Oxygen 3l/m at discharge . BAL 10/06/21 neg. AFB neg . Fungal cx neg.  He has had numerous hospitalizations for recurrent pneumonia. He remains at [facility]. Still feels bad. Had chest xray yesterday with mild pulmonary infiltrate R > L . BNP was elevated.  He has been recommended to begin Levaquin, prednisone taper and Lasix starting today. Takes Percocet Twice daily, over last month . Has chest wall and  pleuritic pain.  Essential tremors on Mysoline, RLS on requip. On seroquel for insomnia /depression  History of pain pill addiction years ago.  Patient is very adamant he does not want to stop taking Percocet.  Significant patient education regarding risk of chronic pain medicines and increased risk for aspiration. Does have swallowing evaluation in 07/2021 , mild aspiration risk, mild dysphagia.  Chest x-ray today shows asymmetrical airspace opacity in the right mid and lower lung without significant change.  Patient denies any fever, hemoptysis, orthopnea.  Does feel like he is retaining some fluid in his stomach area.     Allergies  Allergen Reactions   Dilaudid [Hydromorphone] Other (See Comments)    Respiratory Arrest!!!!   Bactrim [Sulfamethoxazole-Trimethoprim] Rash   Benadryl [Diphenhydramine] Other (See Comments)    Muscle spasms   Phenobarbital Other (See Comments)    From childhood- reaction not recalled at this time   Penicillins Other (See Comments)    From childhood- reaction not recalled at this time    Immunization History  Administered Date(s) Administered   Fluad Quad(high Dose 65+) 03/08/2021   Influenza Nasal 03/15/2013   Influenza, Seasonal, Injecte, Preservative Fre 01/08/2010, 02/21/2014   Influenza,inj,Quad PF,6+ Mos 04/08/2016, 01/30/2019   Influenza-Unspecified 04/18/1995, 01/31/1997, 03/02/2005, 02/16/2006, 03/21/2007, 03/19/2009, 12/31/2009, 05/03/2011, 01/31/2012, 05/17/2012, 02/19/2013, 03/15/2013, 02/01/2016   Moderna Sars-Covid-2 Vaccination 06/14/2019, 07/12/2019, 04/16/2020   Pneumococcal Conjugate-13 01/21/2014, 03/20/2019   Pneumococcal Polysaccharide-23 03/16/2011, 06/03/2011, 03/08/2021   Tdap 09/20/2014   Zoster, Live 01/21/2014    Past Medical History:  Diagnosis Date   Anxiety    Coronary artery disease    Depression    Essential tremor    GERD (  gastroesophageal reflux disease)    High cholesterol    Hypertension    Myocardial  infarct (HCC)    Orthostatic hypotension    Stroke ([place])    Vascular dementia ([place])     Tobacco History: Social History   Tobacco Use  Smoking Status Never   Passive exposure: Never  Smokeless Tobacco Never   Counseling given: Not Answered   Outpatient Medications Prior to Visit  Medication Sig Dispense Refill   acetaminophen (TYLENOL) 500 MG tablet Take 500 mg by mouth every 6 (six) hours as needed for mild pain.     albuterol (PROVENTIL) (2.5 MG/3ML) 0.083% nebulizer solution Take 3 mLs (2.5 mg total) by nebulization every 6 (six) hours as needed for wheezing or shortness of breath. (Patient taking differently: Take 2.5 mg by nebulization every 4 (four) hours as needed for shortness of breath.) 75 mL 12   albuterol (VENTOLIN HFA) 108 (90 Base) MCG/ACT inhaler Inhale 2 puffs into the lungs every 6 (six) hours as needed for wheezing or shortness of breath.     allopurinol (ZYLOPRIM) 100 MG tablet Take 100 mg by mouth daily.     aspirin 81 MG EC tablet Take 81 mg by mouth daily.     atorvastatin (LIPITOR) 80 MG tablet Take 80 mg by mouth daily.     b complex vitamins capsule Take 1 capsule by mouth daily.     Cholecalciferol (VITAMIN D3) 10 MCG (400 UNIT) CAPS Take 400 Units by mouth in the morning.     clopidogrel (PLAVIX) 75 MG tablet Take 75 mg by mouth daily.     escitalopram (LEXAPRO) 20 MG tablet Take 1 tablet (20 mg total) by mouth daily. 90 tablet 0   furosemide (LASIX) 40 MG tablet Take 40 mg by mouth See admin instructions. One time, give 1 tablet for fluid build up.     ipratropium-albuterol (DUONEB) 0.5-2.5 (3) MG/3ML SOLN Take 3 mLs by nebulization every 8 (eight) hours. Changed to every 12 hours scheduled     latanoprost (XALATAN) 0.005 % ophthalmic solution Place 1 drop into both eyes at bedtime.     magnesium oxide (MAG-OX) 400 MG tablet Take 400 mg by mouth daily.     memantine (NAMENDA) 10 MG tablet TAKE 1 TABLET TWICE A DAY (Patient taking differently: Take 10 mg by  mouth 2 (two) times daily.) 180 tablet 1   Metoprolol Tartrate 37.5 MG TABS Take 37.5 mg by mouth 2 (two) times daily. 60 tablet 1   midodrine (PROAMATINE) 5 MG tablet Take 0.5 tablets (2.5 mg total) by mouth 3 (three) times daily with meals. 270 tablet 3   mometasone-formoterol (DULERA) 200-5 MCG/ACT AERO Inhale 2 puffs into the lungs 2 (two) times daily. 1 each 0   nitroGLYCERIN (NITROLINGUAL) 0.4 MG/SPRAY spray Place 1 spray under the tongue every 5 (five) minutes x 3 doses as needed for chest pain.     oxyCODONE-acetaminophen (PERCOCET/ROXICET) 5-325 MG tablet Take 1 tablet by mouth every 12 (twelve) hours as needed for moderate pain. (Patient taking differently: Take 0.5 tablets by mouth every 12 (twelve) hours as needed for moderate pain.) 10 tablet 0   pantoprazole (PROTONIX) 40 MG tablet Take 1 tablet (40 mg total) by mouth every evening. 90 tablet 3   primidone (MYSOLINE) 50 MG tablet Take 2 tablets (100 mg total) by mouth 2 (two) times daily. 360 tablet 0   QUEtiapine (SEROQUEL) 50 MG tablet Take 2 tablets (100 mg total) by mouth at bedtime. 180 tablet  0   ranolazine (RANEXA) 1000 MG SR tablet Take 1,000 mg by mouth in the morning and at bedtime.     rOPINIRole (REQUIP XL) 4 MG 24 hr tablet Take 1 tablet (4 mg total) by mouth at bedtime. 90 tablet 3   triamcinolone ointment (KENALOG) 0.1 % Apply 1 application. topically daily as needed (to affected areas- for psoriasis flares).     ferrous sulfate 325 (65 FE) MG EC tablet Take 1 tablet (325 mg total) by mouth in the morning and at bedtime. 180 tablet 1   benzonatate (TESSALON) 100 MG capsule Take 1 capsule (100 mg total) by mouth 3 (three) times daily as needed for cough. (Patient not taking: Reported on 11/12/2021) 20 capsule 0   guaiFENesin (MUCINEX) 600 MG 12 hr tablet Take 1 tablet (600 mg total) by mouth 2 (two) times daily. (Patient not taking: Reported on 11/12/2021)     polyethylene glycol (MIRALAX / GLYCOLAX) 17 g packet Take 17 g by  mouth 2 (two) times daily. (Patient not taking: Reported on 11/12/2021) 14 each 0   predniSONE (DELTASONE) 20 MG tablet 40 mg p.o. daily for 1 week, then 30 mg p.o. daily for 1 week, 20 mg p.o. daily for 1 week, 10 mg p.o. daily for 1 week and then stop. (Patient not taking: Reported on 11/12/2021)     No facility-administered medications prior to visit.     Review of Systems:   Constitutional:   No  weight loss, night sweats,  Fevers, chills,  +fatigue, or  lassitude.  HEENT:   No headaches,  Difficulty swallowing,  Tooth/dental problems, or  Sore throat,                No sneezing, itching, ear ache, nasal congestion, post nasal drip,   CV:  No chest pain,  Orthopnea, PND, swelling in lower extremities, anasarca, dizziness, palpitations, syncope.   GI  No heartburn, indigestion, abdominal pain, nausea, vomiting, diarrhea, change in bowel habits, loss of appetite, bloody stools.   Resp:  No chest wall deformity  Skin: no rash or lesions.  GU: no dysuria, change in color of urine, no urgency or frequency.  No flank pain, no hematuria   MS:  No joint pain or swelling.  No decreased range of motion.  No back pain.    Physical Exam  BP 112/80 (BP Location: Right Arm, Patient Position: Sitting, Cuff Size: Normal)   Pulse 96   Temp 98.4 F (36.9 C) (Oral)   Ht _0  (1.854 m)   Wt 232 lb (105.2 kg)   SpO2 97%   BMI 30.61 kg/m   GEN: A/Ox3; pleasant , NAD, chronically ill-appearing, on oxygen, in wheelchair   HEENT:  [place]/AT,  EACs-clear, TMs-wnl, NOSE-clear, THROAT-clear, no lesions, no postnasal drip or exudate noted.   NECK:  Supple w/ fair ROM; no JVD; normal carotid impulses w/o bruits; no thyromegaly or nodules palpated; no lymphadenopathy.    RESP few scattered rhonchi   no accessory muscle use, no dullness to percussion  CARD:  RRR, gr 2 SM , tr  peripheral edema, pulses intact, no cyanosis or clubbing.  GI:   Soft & nt; nml bowel sounds; no organomegaly or masses  detected.   Musco: Warm bil, no deformities or joint swelling noted.   Neuro: alert, no focal deficits noted.    Skin: Warm, no lesions or rashes    Lab Results:  CBC    Component Value Date/Time   WBC 12.3 (H)  10/26/2021 0051   RBC 3.33 (L) 10/26/2021 0051   HGB 10.7 (L) 10/26/2021 0051   HCT 32.3 (L) 10/26/2021 0051   PLT 206 10/26/2021 0051   MCV 97.0 10/26/2021 0051   MCH 32.1 10/26/2021 0051   MCHC 33.1 10/26/2021 0051   RDW 14.7 10/26/2021 0051   LYMPHSABS 1.2 10/19/2021 1200   MONOABS 1.0 10/19/2021 1200   EOSABS 0.1 10/19/2021 1200   BASOSABS 0.0 10/19/2021 1200    BMET    Component Value Date/Time   NA 138 10/26/2021 0430   NA 137 03/29/2021 1158   K 4.0 10/26/2021 0430   CL 103 10/26/2021 0430   CO2 26 10/26/2021 0430   GLUCOSE 112 (H) 10/26/2021 0430   BUN 22 10/26/2021 0430   BUN 23 03/29/2021 1158   CREATININE 0.97 10/26/2021 0430   CREATININE 0.99 09/28/2021 0000   CALCIUM 9.1 10/26/2021 0430   GFRNONAA >60 10/26/2021 0430    BNP    Component Value Date/Time   BNP 165.5 (H) 10/19/2021 1200   BNP 180 (H) 09/28/2021 0000    ProBNP    Component Value Date/Time   PROBNP 370 03/18/2021 1004    Imaging: DG Chest 2 View  Result Date: 11/12/2021 CLINICAL DATA:  Follow-up pneumonia. EXAM: CHEST - 2 VIEW COMPARISON:  10/21/2021 FINDINGS: Stable cardiomegaly. Pacemaker remains in appropriate position. Asymmetric airspace opacity is again seen in the right mid and lower lungs, without significant change. Bilateral pleural thickening versus small pleural effusion again noted. IMPRESSION: No significant change in asymmetric airspace opacity in right mid and lower lungs. Stable bilateral pleural thickening versus small pleural effusion. Electronically Signed   By: Marlaine Hind M.D.   On: 11/12/2021 11:23   DG Chest Port 1 View  Result Date: 10/21/2021 CLINICAL DATA:  Shortness of breath, post bronchoscopy EXAM: PORTABLE CHEST 1 VIEW COMPARISON:   10/19/2021 FINDINGS: Rotated AP portable examination. Cardiomegaly. Left chest multi lead pacer. Bilateral heterogeneous airspace opacity, not significantly changed. No new airspace opacity. IMPRESSION: 1. Rotated AP portable examination. Bilateral heterogeneous airspace opacity, not significantly changed. No new airspace opacity. 2. Cardiomegaly. Electronically Signed   By: Delanna Ahmadi M.D.   On: 10/21/2021 08:25   DG Abd 1 View  Result Date: 10/19/2021 CLINICAL DATA:  [id number]. Encounter for Abdominal distension, shortness of breath EXAM: ABDOMEN - 1 VIEW COMPARISON:  None Available. FINDINGS: Atherosclerotic plaque. The bowel gas pattern is normal. No radio-opaque calculi or other significant radiographic abnormality are seen. Two lead cardiac pacemaker partially visualized. Coronary artery stents. IMPRESSION: 1. Nonobstructive bowel gas pattern. 2. Aortic Atherosclerosis (ICD10-I70.0). 3. Please see separately dictated chest x-ray 10/19/2021. Electronically Signed   By: Iven Finn M.D.   On: 10/19/2021 19:52   DG Chest 2 View  Result Date: 10/19/2021 CLINICAL DATA:  PNA, worsening SOB EXAM: CHEST - 2 VIEW COMPARISON:  Radiograph 10/12/2021 FINDINGS: Unchanged dual chamber pacemaker leads. Unchanged cardiomediastinal silhouette. Persistent bilateral interstitial and airspace disease, worsening in the right mid and medial basilar lung. Stable small bilateral pleural effusions. No pneumothorax. No acute osseous abnormality. IMPRESSION: Persistent multifocal pneumonia with increasing airspace disease in the right mid and medial basilar lung. Stable bilateral pleural effusions. Electronically Signed   By: Maurine Simmering M.D.   On: 10/19/2021 11:18          No data to display          No results found for: "NITRICOXIDE"      Assessment & Plan:   Recurrent pneumonia  Patient with recurrent pneumonia with multiple hospitalizations-highly suspicious for underlying aspiration.  Swallow  evaluation April 2023 showed only mild dysphagia and mild aspiration risk however patient has multiple episodes and has intermitting episodes where he feels that he is choking.  Also on multiple sedating medications and now on chronic narcotics over the last month all of which increase his risk for aspiration events.  Have discussed in detail with patient and wife.  Patient with clinical decline this past few days.  Has been started on antibiotics and steroids by rehab providers.  Chest x-ray today shows right-sided opacities which seem to be similar to the last chest x-ray.  Slightly decreased aeration on the right per my inspection. Would go ahead and complete antibiotics.  Continue with aggressive pulmonary hygiene with DuoNeb 3 times daily.  Flutter valve 3 times daily.  Mobility as able. Strict aspiration cautions with eating.  Would try to wean off of chronic narcotics if possible as these may increase his risk for aspiration with oversedation  Plan  Patient Instructions  Increase Duoneb Three times a day   Flutter valve Three times a day  Use caution with Percocet as may increase aspiration risk  Aspiration precautions , upright 2 hrs after eating , no eating 3 hr before bedtime  Continue on Oxygen 3lm  Continue on Dulera 2 puffs Twice daily   Finish Levaquin and Prednisone as directed  Lasix as directed.  Follow up with Dr. Erin Fulling in 2-3 weeks with chest xray and As needed   Please contact office for sooner follow up if symptoms do not improve or worsen or seek emergency care       Diastolic CHF [facility]) She does have some mild fluid retention, elevated BNP.  May have some decompensated diastolic dysfunction.  Agree with restarting Lasix.  We will need to monitor kidney function and blood pressure closely.  Plan  Patient Instructions  Increase Duoneb Three times a day   Flutter valve Three times a day  Use caution with Percocet as may increase aspiration risk  Aspiration precautions  , upright 2 hrs after eating , no eating 3 hr before bedtime  Continue on Oxygen 3lm  Continue on Dulera 2 puffs Twice daily   Finish Levaquin and Prednisone as directed  Lasix as directed.  Follow up with Dr. Erin Fulling in 2-3 weeks with chest xray and As needed   Please contact office for sooner follow up if symptoms do not improve or worsen or seek emergency care       Chronic respiratory failure with hypoxia ([place]) Continue on oxygen to maintain O2 saturations greater than 88 to 90%.   I spent  45  minutes dedicated to the care of this patient on the date of this encounter to include pre-visit review of records, face-to-face time with the patient discussing conditions above, post visit ordering of testing, clinical documentation with the electronic health record, making appropriate referrals as documented, and communicating necessary findings to members of the patients care team.    Rexene Edison, NP 11/12/2021

## 2021-11-12 NOTE — Patient Instructions (Signed)
Increase Duoneb Three times a day   Flutter valve Three times a day  Use caution with Percocet as may increase aspiration risk  Aspiration precautions , upright 2 hrs after eating , no eating 3 hr before bedtime  Continue on Oxygen 3lm  Continue on Dulera 2 puffs Twice daily   Finish Levaquin and Prednisone as directed  Lasix as directed.  Follow up with Dr. Erin Fulling in 2-3 weeks with chest xray and As needed   Please contact office for sooner follow up if symptoms do not improve or worsen or seek emergency care

## 2021-11-15 ENCOUNTER — Encounter (HOSPITAL_COMMUNITY): Payer: Self-pay | Admitting: Internal Medicine

## 2021-11-15 ENCOUNTER — Inpatient Hospital Stay (HOSPITAL_COMMUNITY)
Admission: EM | Admit: 2021-11-15 | Discharge: 2021-11-30 | DRG: 207 | Disposition: E | Payer: Medicare Other | Source: Skilled Nursing Facility | Attending: Pulmonary Disease | Admitting: Pulmonary Disease

## 2021-11-15 ENCOUNTER — Inpatient Hospital Stay (HOSPITAL_COMMUNITY): Payer: Medicare Other

## 2021-11-15 ENCOUNTER — Other Ambulatory Visit: Payer: Self-pay

## 2021-11-15 ENCOUNTER — Emergency Department (HOSPITAL_COMMUNITY): Payer: Medicare Other

## 2021-11-15 DIAGNOSIS — M7989 Other specified soft tissue disorders: Secondary | ICD-10-CM | POA: Diagnosis not present

## 2021-11-15 DIAGNOSIS — I5032 Chronic diastolic (congestive) heart failure: Secondary | ICD-10-CM | POA: Diagnosis present

## 2021-11-15 DIAGNOSIS — Z9049 Acquired absence of other specified parts of digestive tract: Secondary | ICD-10-CM

## 2021-11-15 DIAGNOSIS — K2970 Gastritis, unspecified, without bleeding: Secondary | ICD-10-CM | POA: Diagnosis not present

## 2021-11-15 DIAGNOSIS — K219 Gastro-esophageal reflux disease without esophagitis: Secondary | ICD-10-CM

## 2021-11-15 DIAGNOSIS — M109 Gout, unspecified: Secondary | ICD-10-CM

## 2021-11-15 DIAGNOSIS — I2723 Pulmonary hypertension due to lung diseases and hypoxia: Secondary | ICD-10-CM | POA: Diagnosis present

## 2021-11-15 DIAGNOSIS — Z950 Presence of cardiac pacemaker: Secondary | ICD-10-CM

## 2021-11-15 DIAGNOSIS — I708 Atherosclerosis of other arteries: Secondary | ICD-10-CM | POA: Diagnosis present

## 2021-11-15 DIAGNOSIS — K222 Esophageal obstruction: Secondary | ICD-10-CM | POA: Diagnosis not present

## 2021-11-15 DIAGNOSIS — E440 Moderate protein-calorie malnutrition: Secondary | ICD-10-CM | POA: Diagnosis not present

## 2021-11-15 DIAGNOSIS — B370 Candidal stomatitis: Secondary | ICD-10-CM | POA: Diagnosis present

## 2021-11-15 DIAGNOSIS — J849 Interstitial pulmonary disease, unspecified: Secondary | ICD-10-CM | POA: Diagnosis not present

## 2021-11-15 DIAGNOSIS — D631 Anemia in chronic kidney disease: Secondary | ICD-10-CM | POA: Diagnosis present

## 2021-11-15 DIAGNOSIS — K449 Diaphragmatic hernia without obstruction or gangrene: Secondary | ICD-10-CM | POA: Diagnosis present

## 2021-11-15 DIAGNOSIS — R1310 Dysphagia, unspecified: Secondary | ICD-10-CM | POA: Diagnosis not present

## 2021-11-15 DIAGNOSIS — Z888 Allergy status to other drugs, medicaments and biological substances status: Secondary | ICD-10-CM

## 2021-11-15 DIAGNOSIS — K5900 Constipation, unspecified: Secondary | ICD-10-CM | POA: Diagnosis not present

## 2021-11-15 DIAGNOSIS — J80 Acute respiratory distress syndrome: Secondary | ICD-10-CM | POA: Diagnosis not present

## 2021-11-15 DIAGNOSIS — R140 Abdominal distension (gaseous): Secondary | ICD-10-CM | POA: Diagnosis present

## 2021-11-15 DIAGNOSIS — G2581 Restless legs syndrome: Secondary | ICD-10-CM

## 2021-11-15 DIAGNOSIS — Z8249 Family history of ischemic heart disease and other diseases of the circulatory system: Secondary | ICD-10-CM

## 2021-11-15 DIAGNOSIS — F0153 Vascular dementia, unspecified severity, with mood disturbance: Secondary | ICD-10-CM | POA: Diagnosis present

## 2021-11-15 DIAGNOSIS — R933 Abnormal findings on diagnostic imaging of other parts of digestive tract: Secondary | ICD-10-CM | POA: Diagnosis not present

## 2021-11-15 DIAGNOSIS — E872 Acidosis, unspecified: Secondary | ICD-10-CM | POA: Diagnosis present

## 2021-11-15 DIAGNOSIS — N1831 Chronic kidney disease, stage 3a: Secondary | ICD-10-CM | POA: Diagnosis present

## 2021-11-15 DIAGNOSIS — Z515 Encounter for palliative care: Secondary | ICD-10-CM

## 2021-11-15 DIAGNOSIS — R7989 Other specified abnormal findings of blood chemistry: Secondary | ICD-10-CM | POA: Diagnosis present

## 2021-11-15 DIAGNOSIS — F5101 Primary insomnia: Secondary | ICD-10-CM

## 2021-11-15 DIAGNOSIS — A419 Sepsis, unspecified organism: Secondary | ICD-10-CM

## 2021-11-15 DIAGNOSIS — J690 Pneumonitis due to inhalation of food and vomit: Secondary | ICD-10-CM | POA: Diagnosis present

## 2021-11-15 DIAGNOSIS — Z95818 Presence of other cardiac implants and grafts: Secondary | ICD-10-CM

## 2021-11-15 DIAGNOSIS — Z823 Family history of stroke: Secondary | ICD-10-CM

## 2021-11-15 DIAGNOSIS — E7800 Pure hypercholesterolemia, unspecified: Secondary | ICD-10-CM | POA: Diagnosis present

## 2021-11-15 DIAGNOSIS — K760 Fatty (change of) liver, not elsewhere classified: Secondary | ICD-10-CM | POA: Diagnosis present

## 2021-11-15 DIAGNOSIS — I130 Hypertensive heart and chronic kidney disease with heart failure and stage 1 through stage 4 chronic kidney disease, or unspecified chronic kidney disease: Secondary | ICD-10-CM | POA: Diagnosis present

## 2021-11-15 DIAGNOSIS — Z885 Allergy status to narcotic agent status: Secondary | ICD-10-CM

## 2021-11-15 DIAGNOSIS — Z79899 Other long term (current) drug therapy: Secondary | ICD-10-CM

## 2021-11-15 DIAGNOSIS — D6959 Other secondary thrombocytopenia: Secondary | ICD-10-CM | POA: Diagnosis not present

## 2021-11-15 DIAGNOSIS — E162 Hypoglycemia, unspecified: Secondary | ICD-10-CM | POA: Diagnosis not present

## 2021-11-15 DIAGNOSIS — Z8673 Personal history of transient ischemic attack (TIA), and cerebral infarction without residual deficits: Secondary | ICD-10-CM

## 2021-11-15 DIAGNOSIS — I69391 Dysphagia following cerebral infarction: Secondary | ICD-10-CM

## 2021-11-15 DIAGNOSIS — G894 Chronic pain syndrome: Secondary | ICD-10-CM | POA: Diagnosis present

## 2021-11-15 DIAGNOSIS — D649 Anemia, unspecified: Secondary | ICD-10-CM

## 2021-11-15 DIAGNOSIS — R1319 Other dysphagia: Secondary | ICD-10-CM | POA: Diagnosis not present

## 2021-11-15 DIAGNOSIS — F0150 Vascular dementia without behavioral disturbance: Secondary | ICD-10-CM | POA: Diagnosis present

## 2021-11-15 DIAGNOSIS — I480 Paroxysmal atrial fibrillation: Secondary | ICD-10-CM | POA: Diagnosis present

## 2021-11-15 DIAGNOSIS — I82622 Acute embolism and thrombosis of deep veins of left upper extremity: Secondary | ICD-10-CM | POA: Diagnosis not present

## 2021-11-15 DIAGNOSIS — Z86010 Personal history of colonic polyps: Secondary | ICD-10-CM

## 2021-11-15 DIAGNOSIS — Z7902 Long term (current) use of antithrombotics/antiplatelets: Secondary | ICD-10-CM

## 2021-11-15 DIAGNOSIS — I350 Nonrheumatic aortic (valve) stenosis: Secondary | ICD-10-CM | POA: Diagnosis present

## 2021-11-15 DIAGNOSIS — J189 Pneumonia, unspecified organism: Principal | ICD-10-CM

## 2021-11-15 DIAGNOSIS — I471 Supraventricular tachycardia: Secondary | ICD-10-CM | POA: Diagnosis present

## 2021-11-15 DIAGNOSIS — F01511 Vascular dementia, unspecified severity, with agitation: Secondary | ICD-10-CM | POA: Diagnosis not present

## 2021-11-15 DIAGNOSIS — J9611 Chronic respiratory failure with hypoxia: Secondary | ICD-10-CM | POA: Diagnosis present

## 2021-11-15 DIAGNOSIS — I951 Orthostatic hypotension: Secondary | ICD-10-CM

## 2021-11-15 DIAGNOSIS — Z8701 Personal history of pneumonia (recurrent): Secondary | ICD-10-CM

## 2021-11-15 DIAGNOSIS — F411 Generalized anxiety disorder: Secondary | ICD-10-CM

## 2021-11-15 DIAGNOSIS — F32A Depression, unspecified: Secondary | ICD-10-CM | POA: Diagnosis not present

## 2021-11-15 DIAGNOSIS — Z7982 Long term (current) use of aspirin: Secondary | ICD-10-CM

## 2021-11-15 DIAGNOSIS — R739 Hyperglycemia, unspecified: Secondary | ICD-10-CM | POA: Diagnosis not present

## 2021-11-15 DIAGNOSIS — Z955 Presence of coronary angioplasty implant and graft: Secondary | ICD-10-CM

## 2021-11-15 DIAGNOSIS — Z8711 Personal history of peptic ulcer disease: Secondary | ICD-10-CM

## 2021-11-15 DIAGNOSIS — E669 Obesity, unspecified: Secondary | ICD-10-CM | POA: Diagnosis present

## 2021-11-15 DIAGNOSIS — N179 Acute kidney failure, unspecified: Secondary | ICD-10-CM | POA: Diagnosis present

## 2021-11-15 DIAGNOSIS — Z880 Allergy status to penicillin: Secondary | ICD-10-CM

## 2021-11-15 DIAGNOSIS — R32 Unspecified urinary incontinence: Secondary | ICD-10-CM | POA: Diagnosis not present

## 2021-11-15 DIAGNOSIS — Z20822 Contact with and (suspected) exposure to covid-19: Secondary | ICD-10-CM | POA: Diagnosis present

## 2021-11-15 DIAGNOSIS — R778 Other specified abnormalities of plasma proteins: Secondary | ICD-10-CM | POA: Diagnosis present

## 2021-11-15 DIAGNOSIS — E870 Hyperosmolality and hypernatremia: Secondary | ICD-10-CM | POA: Diagnosis not present

## 2021-11-15 DIAGNOSIS — R1313 Dysphagia, pharyngeal phase: Secondary | ICD-10-CM | POA: Diagnosis present

## 2021-11-15 DIAGNOSIS — I2510 Atherosclerotic heart disease of native coronary artery without angina pectoris: Secondary | ICD-10-CM | POA: Diagnosis not present

## 2021-11-15 DIAGNOSIS — K2100 Gastro-esophageal reflux disease with esophagitis, without bleeding: Secondary | ICD-10-CM | POA: Diagnosis present

## 2021-11-15 DIAGNOSIS — Z9981 Dependence on supplemental oxygen: Secondary | ICD-10-CM

## 2021-11-15 DIAGNOSIS — Z66 Do not resuscitate: Secondary | ICD-10-CM | POA: Diagnosis not present

## 2021-11-15 DIAGNOSIS — E875 Hyperkalemia: Secondary | ICD-10-CM | POA: Diagnosis not present

## 2021-11-15 DIAGNOSIS — J9621 Acute and chronic respiratory failure with hypoxia: Secondary | ICD-10-CM | POA: Diagnosis not present

## 2021-11-15 DIAGNOSIS — G4700 Insomnia, unspecified: Secondary | ICD-10-CM | POA: Diagnosis present

## 2021-11-15 DIAGNOSIS — G250 Essential tremor: Secondary | ICD-10-CM | POA: Diagnosis present

## 2021-11-15 DIAGNOSIS — I5030 Unspecified diastolic (congestive) heart failure: Secondary | ICD-10-CM | POA: Diagnosis present

## 2021-11-15 DIAGNOSIS — I2722 Pulmonary hypertension due to left heart disease: Secondary | ICD-10-CM | POA: Diagnosis present

## 2021-11-15 DIAGNOSIS — E876 Hypokalemia: Secondary | ICD-10-CM | POA: Diagnosis not present

## 2021-11-15 DIAGNOSIS — Z8774 Personal history of (corrected) congenital malformations of heart and circulatory system: Secondary | ICD-10-CM

## 2021-11-15 DIAGNOSIS — F0154 Vascular dementia, unspecified severity, with anxiety: Secondary | ICD-10-CM

## 2021-11-15 DIAGNOSIS — Z6831 Body mass index (BMI) 31.0-31.9, adult: Secondary | ICD-10-CM

## 2021-11-15 DIAGNOSIS — Q399 Congenital malformation of esophagus, unspecified: Secondary | ICD-10-CM

## 2021-11-15 DIAGNOSIS — Z882 Allergy status to sulfonamides status: Secondary | ICD-10-CM

## 2021-11-15 DIAGNOSIS — I252 Old myocardial infarction: Secondary | ICD-10-CM

## 2021-11-15 DIAGNOSIS — I495 Sick sinus syndrome: Secondary | ICD-10-CM

## 2021-11-15 HISTORY — DX: Depression, unspecified: F32.A

## 2021-11-15 HISTORY — DX: Unspecified atrial fibrillation: I48.91

## 2021-11-15 HISTORY — DX: Anxiety disorder, unspecified: F41.9

## 2021-11-15 HISTORY — DX: Sick sinus syndrome: I49.5

## 2021-11-15 LAB — COMPREHENSIVE METABOLIC PANEL
ALT: 45 U/L — ABNORMAL HIGH (ref 0–44)
AST: 45 U/L — ABNORMAL HIGH (ref 15–41)
Albumin: 2.9 g/dL — ABNORMAL LOW (ref 3.5–5.0)
Alkaline Phosphatase: 56 U/L (ref 38–126)
Anion gap: 15 (ref 5–15)
BUN: 18 mg/dL (ref 8–23)
CO2: 22 mmol/L (ref 22–32)
Calcium: 9.1 mg/dL (ref 8.9–10.3)
Chloride: 98 mmol/L (ref 98–111)
Creatinine, Ser: 1.53 mg/dL — ABNORMAL HIGH (ref 0.61–1.24)
GFR, Estimated: 49 mL/min — ABNORMAL LOW (ref >60)
Glucose, Bld: 201 mg/dL — ABNORMAL HIGH (ref 70–99)
Potassium: 4.3 mmol/L (ref 3.5–5.1)
Sodium: 135 mmol/L (ref 135–145)
Total Bilirubin: 0.7 mg/dL (ref 0.3–1.2)
Total Protein: 7.1 g/dL (ref 6.5–8.1)

## 2021-11-15 LAB — CBC WITH DIFFERENTIAL/PLATELET
Abs Immature Granulocytes: 0.15 10*3/uL — ABNORMAL HIGH (ref 0.00–0.07)
Basophils Absolute: 0 10*3/uL (ref 0.0–0.1)
Basophils Relative: 0 %
Eosinophils Absolute: 0 10*3/uL (ref 0.0–0.5)
Eosinophils Relative: 0 %
HCT: 39.4 % (ref 39.0–52.0)
Hemoglobin: 12 g/dL — ABNORMAL LOW (ref 13.0–17.0)
Immature Granulocytes: 1 %
Lymphocytes Relative: 3 %
Lymphs Abs: 0.7 10*3/uL (ref 0.7–4.0)
MCH: 31.1 pg (ref 26.0–34.0)
MCHC: 30.5 g/dL (ref 30.0–36.0)
MCV: 102.1 fL — ABNORMAL HIGH (ref 80.0–100.0)
Monocytes Absolute: 1.2 10*3/uL — ABNORMAL HIGH (ref 0.1–1.0)
Monocytes Relative: 6 %
Neutro Abs: 17.9 10*3/uL — ABNORMAL HIGH (ref 1.7–7.7)
Neutrophils Relative %: 90 %
Platelets: 198 10*3/uL (ref 150–400)
RBC: 3.86 MIL/uL — ABNORMAL LOW (ref 4.22–5.81)
RDW: 16.3 % — ABNORMAL HIGH (ref 11.5–15.5)
WBC: 20 10*3/uL — ABNORMAL HIGH (ref 4.0–10.5)
nRBC: 0 % (ref 0.0–0.2)

## 2021-11-15 LAB — MRSA NEXT GEN BY PCR, NASAL: MRSA by PCR Next Gen: NOT DETECTED

## 2021-11-15 LAB — TROPONIN I (HIGH SENSITIVITY)
Troponin I (High Sensitivity): 38 ng/L — ABNORMAL HIGH (ref <18)
Troponin I (High Sensitivity): 27 ng/L — ABNORMAL HIGH (ref <18)

## 2021-11-15 LAB — PROTIME-INR
INR: 1 (ref 0.8–1.2)
Prothrombin Time: 13.3 seconds (ref 11.4–15.2)

## 2021-11-15 LAB — LACTIC ACID, PLASMA
Lactic Acid, Venous: 2.9 mmol/L (ref 0.5–1.9)
Lactic Acid, Venous: 2.5 mmol/L (ref 0.5–1.9)

## 2021-11-15 LAB — PROCALCITONIN: Procalcitonin: 0.52 ng/mL

## 2021-11-15 LAB — SARS CORONAVIRUS 2 BY RT PCR: SARS Coronavirus 2 by RT PCR: NEGATIVE

## 2021-11-15 LAB — BRAIN NATRIURETIC PEPTIDE: B Natriuretic Peptide: 299.6 pg/mL — ABNORMAL HIGH (ref 0.0–100.0)

## 2021-11-15 LAB — APTT: aPTT: 24 seconds (ref 24–36)

## 2021-11-15 LAB — D-DIMER, QUANTITATIVE: D-Dimer, Quant: 2.05 ug/mL-FEU — ABNORMAL HIGH (ref 0.00–0.50)

## 2021-11-15 MED ORDER — LACTATED RINGERS IV BOLUS
1000.0000 mL | Freq: Once | INTRAVENOUS | Status: AC
  Administered 2021-11-15: 1000 mL via INTRAVENOUS

## 2021-11-15 MED ORDER — SODIUM CHLORIDE 0.9 % IV SOLN
2.0000 g | Freq: Two times a day (BID) | INTRAVENOUS | Status: DC
  Administered 2021-11-16 – 2021-11-17 (×3): 2 g via INTRAVENOUS
  Filled 2021-11-15 (×3): qty 12.5

## 2021-11-15 MED ORDER — ESCITALOPRAM OXALATE 10 MG PO TABS
20.0000 mg | ORAL_TABLET | Freq: Every day | ORAL | Status: DC
  Administered 2021-11-16 – 2021-11-20 (×5): 20 mg via ORAL
  Filled 2021-11-15 (×3): qty 1
  Filled 2021-11-15 (×2): qty 2

## 2021-11-15 MED ORDER — ENOXAPARIN SODIUM 40 MG/0.4ML IJ SOSY
40.0000 mg | PREFILLED_SYRINGE | INTRAMUSCULAR | Status: DC
  Administered 2021-11-15 – 2021-11-23 (×9): 40 mg via SUBCUTANEOUS
  Filled 2021-11-15 (×9): qty 0.4

## 2021-11-15 MED ORDER — LATANOPROST 0.005 % OP SOLN
1.0000 [drp] | Freq: Every day | OPHTHALMIC | Status: DC
Start: 2021-11-15 — End: 2021-11-27
  Administered 2021-11-16 – 2021-11-26 (×11): 1 [drp] via OPHTHALMIC
  Filled 2021-11-15 (×3): qty 2.5

## 2021-11-15 MED ORDER — ATORVASTATIN CALCIUM 40 MG PO TABS
80.0000 mg | ORAL_TABLET | Freq: Every day | ORAL | Status: DC
  Administered 2021-11-16 – 2021-11-19 (×4): 80 mg via ORAL
  Filled 2021-11-15 (×4): qty 1

## 2021-11-15 MED ORDER — ALLOPURINOL 100 MG PO TABS
100.0000 mg | ORAL_TABLET | Freq: Every day | ORAL | Status: DC
  Administered 2021-11-16 – 2021-11-20 (×5): 100 mg via ORAL
  Filled 2021-11-15 (×5): qty 1

## 2021-11-15 MED ORDER — POLYETHYLENE GLYCOL 3350 17 G PO PACK: 17.0000 g | PACK | Freq: Every day | ORAL | Status: DC | PRN

## 2021-11-15 MED ORDER — MIDODRINE HCL 5 MG PO TABS
2.5000 mg | ORAL_TABLET | Freq: Three times a day (TID) | ORAL | Status: DC
  Administered 2021-11-15 – 2021-11-19 (×12): 2.5 mg via ORAL
  Filled 2021-11-15 (×13): qty 1

## 2021-11-15 MED ORDER — ALBUTEROL SULFATE (2.5 MG/3ML) 0.083% IN NEBU
2.5000 mg | INHALATION_SOLUTION | RESPIRATORY_TRACT | Status: DC | PRN
  Administered 2021-11-17 – 2021-11-27 (×8): 2.5 mg via RESPIRATORY_TRACT
  Filled 2021-11-15 (×10): qty 3

## 2021-11-15 MED ORDER — RANOLAZINE ER 500 MG PO TB12
1000.0000 mg | ORAL_TABLET | Freq: Two times a day (BID) | ORAL | Status: DC
  Administered 2021-11-16 – 2021-11-23 (×9): 1000 mg via ORAL
  Filled 2021-11-15 (×18): qty 2

## 2021-11-15 MED ORDER — MEMANTINE HCL 10 MG PO TABS
10.0000 mg | ORAL_TABLET | Freq: Two times a day (BID) | ORAL | Status: DC
  Administered 2021-11-15 – 2021-11-19 (×9): 10 mg via ORAL
  Filled 2021-11-15 (×14): qty 1

## 2021-11-15 MED ORDER — PANTOPRAZOLE SODIUM 40 MG PO TBEC
40.0000 mg | DELAYED_RELEASE_TABLET | Freq: Every evening | ORAL | Status: DC
  Administered 2021-11-15 – 2021-11-19 (×5): 40 mg via ORAL
  Filled 2021-11-15 (×5): qty 1

## 2021-11-15 MED ORDER — IPRATROPIUM-ALBUTEROL 0.5-2.5 (3) MG/3ML IN SOLN
3.0000 mL | Freq: Two times a day (BID) | RESPIRATORY_TRACT | Status: DC
Start: 2021-11-15 — End: 2021-11-18
  Administered 2021-11-15 – 2021-11-18 (×6): 3 mL via RESPIRATORY_TRACT
  Filled 2021-11-15 (×6): qty 3

## 2021-11-15 MED ORDER — QUETIAPINE FUMARATE 100 MG PO TABS
100.0000 mg | ORAL_TABLET | Freq: Every day | ORAL | Status: DC
  Administered 2021-11-15 – 2021-11-19 (×5): 100 mg via ORAL
  Filled 2021-11-15: qty 1
  Filled 2021-11-15: qty 4
  Filled 2021-11-15 (×3): qty 1

## 2021-11-15 MED ORDER — VANCOMYCIN HCL 2000 MG/400ML IV SOLN
2000.0000 mg | Freq: Once | INTRAVENOUS | Status: AC
Start: 2021-11-15 — End: 2021-11-15
  Administered 2021-11-15: 2000 mg via INTRAVENOUS
  Filled 2021-11-15: qty 400

## 2021-11-15 MED ORDER — SODIUM CHLORIDE 0.9 % IV SOLN
2.0000 g | Freq: Once | INTRAVENOUS | Status: AC
  Administered 2021-11-15: 2 g via INTRAVENOUS
  Filled 2021-11-15: qty 12.5

## 2021-11-15 MED ORDER — OXYCODONE-ACETAMINOPHEN 5-325 MG PO TABS
0.5000 | ORAL_TABLET | Freq: Three times a day (TID) | ORAL | Status: DC
  Administered 2021-11-15 – 2021-11-17 (×5): 0.5 via ORAL
  Filled 2021-11-15 (×5): qty 1

## 2021-11-15 MED ORDER — OXYCODONE-ACETAMINOPHEN 5-325 MG PO TABS
1.0000 | ORAL_TABLET | Freq: Once | ORAL | Status: AC
  Administered 2021-11-15: 1 via ORAL
  Filled 2021-11-15: qty 1

## 2021-11-15 MED ORDER — OXYCODONE-ACETAMINOPHEN 5-325 MG PO TABS: 2.0000 | ORAL_TABLET | Freq: Once | ORAL | Status: DC

## 2021-11-15 MED ORDER — ASPIRIN 81 MG PO TBEC
81.0000 mg | DELAYED_RELEASE_TABLET | Freq: Every day | ORAL | Status: DC
  Administered 2021-11-16 – 2021-11-19 (×4): 81 mg via ORAL
  Filled 2021-11-15 (×4): qty 1

## 2021-11-15 MED ORDER — SODIUM CHLORIDE 0.9% FLUSH
3.0000 mL | Freq: Two times a day (BID) | INTRAVENOUS | Status: DC
Start: 2021-11-15 — End: 2021-11-27
  Administered 2021-11-15 – 2021-11-24 (×16): 3 mL via INTRAVENOUS

## 2021-11-15 MED ORDER — METOPROLOL TARTRATE 25 MG PO TABS
37.5000 mg | ORAL_TABLET | Freq: Two times a day (BID) | ORAL | Status: DC
  Administered 2021-11-16 – 2021-11-20 (×9): 37.5 mg via ORAL
  Filled 2021-11-15 (×9): qty 2

## 2021-11-15 MED ORDER — ROPINIROLE HCL ER 4 MG PO TB24
4.0000 mg | ORAL_TABLET | Freq: Every day | ORAL | Status: DC
  Administered 2021-11-16 – 2021-11-23 (×5): 4 mg via ORAL
  Filled 2021-11-15 (×8): qty 1

## 2021-11-15 MED ORDER — ACETAMINOPHEN 650 MG RE SUPP: 650.0000 mg | Freq: Four times a day (QID) | RECTAL | Status: DC | PRN

## 2021-11-15 MED ORDER — LORAZEPAM 0.5 MG PO TABS
0.5000 mg | ORAL_TABLET | ORAL | Status: AC | PRN
  Administered 2021-11-15 – 2021-11-18 (×2): 0.5 mg via ORAL
  Filled 2021-11-15 (×2): qty 1

## 2021-11-15 MED ORDER — OXYCODONE-ACETAMINOPHEN 5-325 MG PO TABS: 0.5000 | ORAL_TABLET | Freq: Two times a day (BID) | ORAL | Status: DC | PRN

## 2021-11-15 MED ORDER — ACETAMINOPHEN 325 MG PO TABS
650.0000 mg | ORAL_TABLET | Freq: Four times a day (QID) | ORAL | Status: DC | PRN
  Administered 2021-11-16 – 2021-11-26 (×3): 650 mg via ORAL
  Filled 2021-11-15 (×3): qty 2

## 2021-11-15 MED ORDER — VANCOMYCIN HCL 1250 MG/250ML IV SOLN
1250.0000 mg | INTRAVENOUS | Status: DC
  Administered 2021-11-16: 1250 mg via INTRAVENOUS
  Filled 2021-11-15 (×2): qty 250

## 2021-11-15 MED ORDER — MOMETASONE FURO-FORMOTEROL FUM 200-5 MCG/ACT IN AERO
2.0000 | INHALATION_SPRAY | Freq: Two times a day (BID) | RESPIRATORY_TRACT | Status: DC
Start: 2021-11-15 — End: 2021-11-16

## 2021-11-15 MED ORDER — PRIMIDONE 50 MG PO TABS
100.0000 mg | ORAL_TABLET | Freq: Two times a day (BID) | ORAL | Status: DC
  Administered 2021-11-16 – 2021-11-19 (×8): 100 mg via ORAL
  Filled 2021-11-15 (×14): qty 2

## 2021-11-15 MED ORDER — CLOPIDOGREL BISULFATE 75 MG PO TABS
75.0000 mg | ORAL_TABLET | Freq: Every day | ORAL | Status: DC
  Administered 2021-11-16 – 2021-11-17 (×2): 75 mg via ORAL
  Filled 2021-11-15 (×2): qty 1

## 2021-11-15 NOTE — Progress Notes (Signed)
Pharmacy Antibiotic Note  Mark Thomas is a 71 y.o. male admitted on 11/02/2021 presenting with SOB, on abx PTA for pna treatment.  Pharmacy has been consulted for vancomycin dosing.  Plan: Vancomycin 2000 mg IV x 1, then 1250 mg IV q 24h (eAUC 497, SCr 1.53) Add MRSA PCR Monitor renal function, Cx/PCR to narrow Vancomycin levels as needed  Height: _0  (185.4 cm) Weight: 105.2 kg (232 lb) IBW/kg (Calculated) : 79.9  Temp (24hrs), Avg:97.8 F (36.6 C), Min:97.8 F (36.6 C), Max:97.8 F (36.6 C)  Recent Labs  Lab 11/20/2021 1519 11/11/2021 1554 11/04/2021 1737  WBC 20.0*  --   --   CREATININE 1.53*  --   --   LATICACIDVEN  --  2.9* 2.5*    Estimated Creatinine Clearance: 58.8 mL/min (A) (by C-G formula based on SCr of 1.53 mg/dL (H)).    Allergies  Allergen Reactions   Dilaudid [Hydromorphone] Other (See Comments)    Respiratory Arrest!!!!   Bactrim [Sulfamethoxazole-Trimethoprim] Rash   Benadryl [Diphenhydramine] Other (See Comments)    Muscle spasms   Phenobarbital Other (See Comments)    From childhood- reaction not recalled at this time   Penicillins Other (See Comments)    From childhood- reaction not recalled at this time    Bertis Ruddy, PharmD Clinical Pharmacist ED Pharmacist Phone # [phone] 11/03/2021 6:39 PM

## 2021-11-15 NOTE — ED Notes (Signed)
2 attempts unsuccessful to obtains second set of blood cultures. Phlebectomy asked to attempt.

## 2021-11-15 NOTE — ED Provider Triage Note (Signed)
Emergency Medicine Provider Triage Evaluation Note  Mark Thomas , a 71 y.o. male  was evaluated in triage.  Pt complains of chest pain and SOB. The patient is coming from [facility] and has had consistent chronic PNA since. Is on multiple antibiotics that are unknown and on prednisone.  Review of Systems  Positive:  Negative:   Physical Exam  There were no vitals taken for this visit. Gen:   Ill -appearing Resp:  Increased effort MSK:   Moves extremities without difficulty. Unable to speak in full sentences.  Other:    Medical Decision Making  Medically screening exam initiated at 3:16 PM.  Appropriate orders placed.  [name] was informed that the remainder of the evaluation will be completed by another provider, this initial triage assessment does not replace that evaluation, and the importance of remaining in the ED until their evaluation is complete.  Patient is hypotensive and tachycardic on arrival.  EMT/paramedic reports that the patient refused to let her do an EKG or get any lab work.  Will attempt to obtain some lab works and start him with a septic work-up.  Dr. Roslynn Amble came and assessed the patient at bedside and recommended the basic septic work-up as well as a chest x-ray. Patient is being roomed to [facility] now.    Sherrell Puller, PA-C 11/18/2021 1524

## 2021-11-15 NOTE — ED Triage Notes (Signed)
Pt bib [facility] from [facility] c/o shortness of breath and worsening pneumonia. Started antibiotics on Saturday and is still taking them but shortness of breath has worsened. Pt arrives at 89% on 4L home o2.

## 2021-11-15 NOTE — ED Provider Notes (Signed)
[facility] Provider Note   CSN: [id number] Arrival date & time: 11/05/2021  1515     History {Add pertinent medical, surgical, social history, OB history to HPI:1} No chief complaint on file.   Mark Thomas is a 71 y.o. male.  HPI Patient presents for shortness of breath.  His medical history includes anxiety, depression, CAD, GERD, HLD, HTN, CVA, dementia, CHF, chronic hypoxia, CKD, and recurrent pneumonia.  He was hospitalized 3 weeks ago for pneumonia.  He was discharged to [facility] rehab facility due to deconditioning.  Since his discharge, he has been on 3 L of supplemental oxygen.  Patient reports that he been having worsening right-sided chest pain, shortness of breath, and hypoxia.  He was restarted on antibiotics 2 days ago.  Per review of his paperwork, this appears to be ceftriaxone and doxycycline.  He is also on Lasix and prednisone.  In addition to his worsening shortness of breath, patient has developed a cough and subjective fever today.  He endorses a anterior right-sided chest pain that is pleuritic in nature.  This has been an ongoing issue for him but has recently worsened.  He takes Percocet for his ongoing right-sided chest pain.  In addition to his pulmonary symptoms, patient also reports that his abdomen feels distended.  He has been having normal bowel movements, including today.  He does not have any abdominal pain.  He states that when he retains fluid, it is typically in his abdominal area.    Home Medications Prior to Admission medications   Medication Sig Start Date End Date Taking? Authorizing Provider  acetaminophen (TYLENOL) 500 MG tablet Take 500 mg by mouth every 6 (six) hours as needed for mild pain.    [provider]  albuterol (PROVENTIL) (2.5 MG/3ML) 0.083% nebulizer solution Take 3 mLs (2.5 mg total) by nebulization every 6 (six) hours as needed for wheezing or shortness of breath. Patient taking  differently: Take 2.5 mg by nebulization every 4 (four) hours as needed for shortness of breath. 07/15/21   Luetta Nutting, DO  albuterol (VENTOLIN HFA) 108 (90 Base) MCG/ACT inhaler Inhale 2 puffs into the lungs every 6 (six) hours as needed for wheezing or shortness of breath.    [provider]  allopurinol (ZYLOPRIM) 100 MG tablet Take 100 mg by mouth daily. 09/22/20   [provider]  aspirin 81 MG EC tablet Take 81 mg by mouth daily. 05/05/14   [provider]  atorvastatin (LIPITOR) 80 MG tablet Take 80 mg by mouth daily. 11/18/20   [provider]  b complex vitamins capsule Take 1 capsule by mouth daily.    [provider]  Cholecalciferol (VITAMIN D3) 10 MCG (400 UNIT) CAPS Take 400 Units by mouth in the morning.    [provider]  clopidogrel (PLAVIX) 75 MG tablet Take 75 mg by mouth daily. 11/18/20   [provider]  escitalopram (LEXAPRO) 20 MG tablet Take 1 tablet (20 mg total) by mouth daily. 11/04/21   Merian Capron, MD  ferrous sulfate 325 (65 FE) MG EC tablet Take 1 tablet (325 mg total) by mouth in the morning and at bedtime. 03/19/21 10/19/21  Luetta Nutting, DO  furosemide (LASIX) 40 MG tablet Take 40 mg by mouth See admin instructions. One time, give 1 tablet for fluid build up.    [provider]  ipratropium-albuterol (DUONEB) 0.5-2.5 (3) MG/3ML SOLN Take 3 mLs by nebulization every 8 (eight) hours. Changed to  every 12 hours scheduled 10/18/21   [provider]  latanoprost (XALATAN) 0.005 % ophthalmic solution Place 1 drop into both eyes at bedtime. 12/07/20   [provider]  magnesium oxide (MAG-OX) 400 MG tablet Take 400 mg by mouth daily.    [provider]  memantine (NAMENDA) 10 MG tablet TAKE 1 TABLET TWICE A DAY Patient taking differently: Take 10 mg by mouth 2 (two) times daily. 09/21/21   Alric Ran, MD  Metoprolol Tartrate 37.5 MG TABS Take 37.5 mg by mouth 2 (two) times  daily. 10/26/21   Shawna Clamp, MD  midodrine (PROAMATINE) 5 MG tablet Take 0.5 tablets (2.5 mg total) by mouth 3 (three) times daily with meals. 10/14/21   Ghimire, Henreitta Leber, MD  mometasone-formoterol (DULERA) 200-5 MCG/ACT AERO Inhale 2 puffs into the lungs 2 (two) times daily. 09/14/21   Freddi Starr, MD  nitroGLYCERIN (NITROLINGUAL) 0.4 MG/SPRAY spray Place 1 spray under the tongue every 5 (five) minutes x 3 doses as needed for chest pain. 05/05/14   [provider]  oxyCODONE-acetaminophen (PERCOCET/ROXICET) 5-325 MG tablet Take 1 tablet by mouth every 12 (twelve) hours as needed for moderate pain. Patient taking differently: Take 0.5 tablets by mouth every 12 (twelve) hours as needed for moderate pain. 10/26/21   Shawna Clamp, MD  pantoprazole (PROTONIX) 40 MG tablet Take 1 tablet (40 mg total) by mouth every evening. 07/07/21   Luetta Nutting, DO  primidone (MYSOLINE) 50 MG tablet Take 2 tablets (100 mg total) by mouth 2 (two) times daily. 08/24/21 11/22/21  Alric Ran, MD  QUEtiapine (SEROQUEL) 50 MG tablet Take 2 tablets (100 mg total) by mouth at bedtime. 11/04/21 02/02/22  Merian Capron, MD  ranolazine (RANEXA) 1000 MG SR tablet Take 1,000 mg by mouth in the morning and at bedtime.    [provider]  rOPINIRole (REQUIP XL) 4 MG 24 hr tablet Take 1 tablet (4 mg total) by mouth at bedtime. 09/23/21 09/18/22  Alric Ran, MD  triamcinolone ointment (KENALOG) 0.1 % Apply 1 application. topically daily as needed (to affected areas- for psoriasis flares). 01/21/21   [provider]  FLUoxetine (PROZAC) 20 MG capsule Take 1 capsule (20 mg total) by mouth daily. 09/21/21 09/28/21  Merian Capron, MD      Allergies    Dilaudid [hydromorphone], Bactrim [sulfamethoxazole-trimethoprim], Benadryl [diphenhydramine], Phenobarbital, and Penicillins    Review of Systems   Review of Systems  Constitutional:  Positive for fatigue and fever.  Respiratory:  Positive for cough,  chest tightness and shortness of breath.   Cardiovascular:  Positive for chest pain.  Gastrointestinal:  Positive for abdominal distention.  All other systems reviewed and are negative.   Physical Exam Updated Vital Signs There were no vitals taken for this visit. Physical Exam Vitals and nursing note reviewed.  Constitutional:      General: He is not in acute distress.    Appearance: Normal appearance. He is well-developed. He is not ill-appearing, toxic-appearing or diaphoretic.  HENT:     Head: Normocephalic and atraumatic.     Right Ear: External ear normal.     Left Ear: External ear normal.     Nose: Nose normal.     Mouth/Throat:     Mouth: Mucous membranes are moist.     Pharynx: Oropharynx is clear.  Eyes:     Extraocular Movements: Extraocular movements intact.     Conjunctiva/sclera: Conjunctivae normal.  Cardiovascular:     Rate and Rhythm: Normal rate and  regular rhythm.     Heart sounds: No murmur heard. Pulmonary:     Effort: Pulmonary effort is normal. No respiratory distress.     Breath sounds: No stridor. Rales present. No wheezing.  Abdominal:     Palpations: Abdomen is soft.     Tenderness: There is no abdominal tenderness.  Musculoskeletal:        General: No swelling. Normal range of motion.     Cervical back: Normal range of motion and neck supple.     Right lower leg: No edema.     Left lower leg: No edema.  Skin:    General: Skin is warm and dry.     Coloration: Skin is not jaundiced or pale.  Neurological:     General: No focal deficit present.     Mental Status: He is alert and oriented to person, place, and time.     Cranial Nerves: No cranial nerve deficit.     Sensory: No sensory deficit.     Motor: No weakness.     Coordination: Coordination normal.  Psychiatric:        Mood and Affect: Mood normal.        Behavior: Behavior normal.        Thought Content: Thought content normal.        Judgment: Judgment normal.     ED Results /  Procedures / Treatments   Labs (all labs ordered are listed, but only abnormal results are displayed) Labs Reviewed  CULTURE, BLOOD (ROUTINE X 2)  CULTURE, BLOOD (ROUTINE X 2)  URINE CULTURE  LACTIC ACID, PLASMA  LACTIC ACID, PLASMA  COMPREHENSIVE METABOLIC PANEL  CBC WITH DIFFERENTIAL/PLATELET  PROTIME-INR  APTT  URINALYSIS, ROUTINE W REFLEX MICROSCOPIC  BRAIN NATRIURETIC PEPTIDE  TROPONIN I (HIGH SENSITIVITY)    EKG None  Radiology No results found.  Procedures Procedures  {Document cardiac monitor, telemetry assessment procedure when appropriate:1}  Medications Ordered in ED Medications - No data to display  ED Course/ Medical Decision Making/ A&P                           Medical Decision Making Amount and/or Complexity of Data Reviewed Labs: ordered.  Risk Prescription drug management. Decision regarding hospitalization.   This patient presents to the ED for concern of shortness of breath and fatigue, this involves an extensive number of treatment options, and is a complaint that carries with it a high risk of complications and morbidity.  The differential diagnosis includes pneumonia, PE, deconditioning, CHF, URI   Co morbidities that complicate the patient evaluation  anxiety, depression, CAD, GERD, HLD, HTN, CVA, dementia, CHF, chronic hypoxia, CKD, and recurrent pneumonia   Additional history obtained:  Additional history obtained from patient's wife External records from outside source obtained and reviewed including EMR   Lab Tests:  I Ordered, and personally interpreted labs.  The pertinent results include: Patient has a leukocytosis and lactic acidosis, consistent with sepsis.  D-dimer is elevated at 2.05.  Troponin is mildly elevated and downtrended on repeat.  Lactic acidosis also improved following IV fluids.  BNP mildly elevated.  Patient has an AKI.   Imaging Studies ordered:  I ordered imaging studies including chest x-ray  I  independently visualized and interpreted imaging which showed right-sided infiltrates consistent with pneumonia I agree with the radiologist interpretation   Cardiac Monitoring: / EKG:  The patient was maintained on a cardiac monitor.  I personally viewed and  interpreted the cardiac monitored which showed an underlying rhythm of: Sinus rhythm   Problem List / ED Course / Critical interventions / Medication management  Patient is a 71 year old male who presents for worsening shortness of breath and fatigue.  He also endorses a subjective fever and onset of cough today.  He had a recent admission to the hospital for pneumonia.  He was discharged to rehab facility.  At rehab facility, he did undergo chest x-ray and they did suspect that he has acquired a new pneumonia.  He was started on antibiotics 2 days ago.  Per review of his paperwork, this was ceftriaxone and doxycycline. I ordered medication including ***  for ***  Reevaluation of the patient after these medicines showed that the patient {resolved/improved/worsened:23923::"improved"} I have reviewed the patients home medicines and have made adjustments as needed   Social Determinants of Health:  ***   Test / Admission - Considered:  ***   {Document critical care time when appropriate:1} {Document review of labs and clinical decision tools ie heart score, Chads2Vasc2 etc:1}  {Document your independent review of radiology images, and any outside records:1} {Document your discussion with family members, caretakers, and with consultants:1} {Document social determinants of health affecting pt's care:1} {Document your decision making why or why not admission, treatments were needed:1} Final Clinical Impression(s) / ED Diagnoses Final diagnoses:  None    Rx / DC Orders ED Discharge Orders     None

## 2021-11-15 NOTE — H&P (Signed)
History and Physical   Mark Thomas TGG:[id number] DOB: 02-May-1951 DOA: 11/02/2021  PCP: Luetta Nutting, DO   Patient coming from: [facility]   Chief Complaint: SOB, PNA  HPI: Mark Thomas is a 71 y.o. male with medical history significant of recurrent pneumonia, pleural effusion, dementia, orthostatic hypotension, atrial fibrillation status post watchman, CAD status post stent, CKD 3, depression, anxiety, insomnia, anemia, GERD, gout, sick sinus syndrome status post pacemaker, RLS, essential tremor, diastolic CHF, chronic respiratory failure with hypoxia presenting with shortness of breath and recurrent pneumonia.  Patient was recently admitted from June 20 to June 27 for respiratory failure secondary to recurrent versus unresolved pneumonia as well as a component of heart failure, was discharged on 3 L.  At that time he was treated with 5 days of cefepime and steroids and was placed on prednisone taper which he remains on.  He was discharged to [facility] for rehab.  He reports increasing shortness of breath, right-sided chest pain and hypoxia for the past 4 days.  They restarted antibiotics at the facility 2 days ago and per paperwork this appears to been ceftriaxone and doxycycline.  Also has remained on Lasix and prednisone as above.  Today patient began to develop subjective fever and cough in addition to his other symptoms.  Was sent to the ED for further evaluation due to failure to respond to initial antibiotics at facility.  He denies chills, abdominal pain, constipation, diarrhea, nausea, vomiting.  ED Course: Vital signs in the ED significant for respiratory rate in the teens to 20s, heart rate in the 90s to 100s, blood pressure in the 70J to 500 systolic on chronic 3 L.  Lab work-up included CMP with creatinine elevated 1.53 from baseline around 1, glucose 201, albumin 2.9, AST 45, ALT 45.  CBC with significant leukocytosis to 20 up from previous of 12, hemoglobin stable  at 12.  Troponin flat at 38 and then 27 on repeat.  BNP mildly elevated at 299.  Lactic acid downtrending at 2.9 and then 2.5 on repeat.  D-dimer elevated 2.05.  Urinalysis, urine culture, blood cultures pending.  Chest x-ray showed worsening patchy infiltrates on the right consistent with bronchopneumonia.  Patient received oxycodone, vancomycin, cefepime, midodrine, 2 L of IV fluids in the ED.  Review of Systems: As per HPI otherwise all other systems reviewed and are negative.  Past Medical History:  Diagnosis Date   Anxiety    Coronary artery disease    Depression    Essential tremor    GERD (gastroesophageal reflux disease)    High cholesterol    Hypertension    Myocardial infarct (HCC)    Orthostatic hypotension    Stroke [facility])    Stroke ([place]) 02/02/2021   Vascular dementia [facility])     Past Surgical History:  Procedure Laterality Date   APPENDECTOMY     CARDIAC SURGERY     CHOLECYSTECTOMY     FLEXIBLE BRONCHOSCOPY N/A 10/06/2021   Procedure: FLEXIBLE BRONCHOSCOPY;  Surgeon: Margaretha Seeds, MD;  Location: [place];  Service: Cardiopulmonary;  Laterality: N/A;   HERNIA REPAIR     NASAL SINUS SURGERY     SHOULDER SURGERY      Social History  reports that he has never smoked. He has never been exposed to tobacco smoke. He has never used smokeless tobacco. He reports that he does not currently use alcohol. He reports that he does not use drugs.  Allergies  Allergen Reactions   Dilaudid [  Hydromorphone] Other (See Comments)    Respiratory Arrest!!!!   Bactrim [Sulfamethoxazole-Trimethoprim] Rash   Benadryl [Diphenhydramine] Other (See Comments)    Muscle spasms   Phenobarbital Other (See Comments)    From childhood- reaction not recalled at this time   Penicillins Other (See Comments)    From childhood- reaction not recalled at this time    Family History  Problem Relation Age of Onset   Hypertension Mother    Stroke Mother    Stroke Father    Hypertension Father    Reviewed on admission  Prior to Admission medications   Medication Sig Start Date End Date Taking? Authorizing Provider  acetaminophen (TYLENOL) 500 MG tablet Take 500 mg by mouth every 6 (six) hours as needed for mild pain.    [provider]  albuterol (PROVENTIL) (2.5 MG/3ML) 0.083% nebulizer solution Take 3 mLs (2.5 mg total) by nebulization every 6 (six) hours as needed for wheezing or shortness of breath. Patient taking differently: Take 2.5 mg by nebulization every 4 (four) hours as needed for shortness of breath. 07/15/21   Luetta Nutting, DO  albuterol (VENTOLIN HFA) 108 (90 Base) MCG/ACT inhaler Inhale 2 puffs into the lungs every 6 (six) hours as needed for wheezing or shortness of breath.    [provider]  allopurinol (ZYLOPRIM) 100 MG tablet Take 100 mg by mouth daily. 09/22/20   [provider]  aspirin 81 MG EC tablet Take 81 mg by mouth daily. 05/05/14   [provider]  atorvastatin (LIPITOR) 80 MG tablet Take 80 mg by mouth daily. 11/18/20   [provider]  b complex vitamins capsule Take 1 capsule by mouth daily.    [provider]  Cholecalciferol (VITAMIN D3) 10 MCG (400 UNIT) CAPS Take 400 Units by mouth in the morning.    [provider]  clopidogrel (PLAVIX) 75 MG tablet Take 75 mg by mouth daily. 11/18/20   [provider]  escitalopram (LEXAPRO) 20 MG tablet Take 1 tablet (20 mg total) by mouth daily. 11/04/21   Merian Capron, MD  ferrous sulfate 325 (65 FE) MG EC tablet Take 1 tablet (325 mg total) by mouth in the morning and at bedtime. 03/19/21 10/19/21  Luetta Nutting, DO  furosemide (LASIX) 40 MG tablet Take 40 mg by mouth See admin instructions. One time, give 1 tablet for fluid build up.    [provider]  ipratropium-albuterol (DUONEB) 0.5-2.5 (3) MG/3ML SOLN Take 3 mLs by nebulization every 8 (eight) hours. Changed to every 12 hours scheduled 10/18/21   [provider]   latanoprost (XALATAN) 0.005 % ophthalmic solution Place 1 drop into both eyes at bedtime. 12/07/20   [provider]  magnesium oxide (MAG-OX) 400 MG tablet Take 400 mg by mouth daily.    [provider]  memantine (NAMENDA) 10 MG tablet TAKE 1 TABLET TWICE A DAY Patient taking differently: Take 10 mg by mouth 2 (two) times daily. 09/21/21   Alric Ran, MD  Metoprolol Tartrate 37.5 MG TABS Take 37.5 mg by mouth 2 (two) times daily. 10/26/21   Shawna Clamp, MD  midodrine (PROAMATINE) 5 MG tablet Take 0.5 tablets (2.5 mg total) by mouth 3 (three) times daily with meals. 10/14/21   Ghimire, Henreitta Leber, MD  mometasone-formoterol (DULERA) 200-5 MCG/ACT AERO Inhale 2 puffs into the lungs 2 (two) times daily. 09/14/21   Freddi Starr, MD  nitroGLYCERIN (NITROLINGUAL) 0.4 MG/SPRAY spray Place 1 spray under the tongue every 5 (five) minutes  x 3 doses as needed for chest pain. 05/05/14   [provider]  oxyCODONE-acetaminophen (PERCOCET/ROXICET) 5-325 MG tablet Take 1 tablet by mouth every 12 (twelve) hours as needed for moderate pain. Patient taking differently: Take 0.5 tablets by mouth every 12 (twelve) hours as needed for moderate pain. 10/26/21   Shawna Clamp, MD  pantoprazole (PROTONIX) 40 MG tablet Take 1 tablet (40 mg total) by mouth every evening. 07/07/21   Luetta Nutting, DO  primidone (MYSOLINE) 50 MG tablet Take 2 tablets (100 mg total) by mouth 2 (two) times daily. 08/24/21 11/22/21  Alric Ran, MD  QUEtiapine (SEROQUEL) 50 MG tablet Take 2 tablets (100 mg total) by mouth at bedtime. 11/04/21 02/02/22  Merian Capron, MD  ranolazine (RANEXA) 1000 MG SR tablet Take 1,000 mg by mouth in the morning and at bedtime.    [provider]  rOPINIRole (REQUIP XL) 4 MG 24 hr tablet Take 1 tablet (4 mg total) by mouth at bedtime. 09/23/21 09/18/22  Alric Ran, MD  triamcinolone ointment (KENALOG) 0.1 % Apply 1 application. topically daily as needed (to affected areas-  for psoriasis flares). 01/21/21   [provider]  FLUoxetine (PROZAC) 20 MG capsule Take 1 capsule (20 mg total) by mouth daily. 09/21/21 09/28/21  Merian Capron, MD    Physical Exam: Vitals:   11/05/2021 1630 11/07/2021 1715 11/12/2021 1730 11/28/2021 1900  BP: 110/62 122/78 116/78 103/77  Pulse: 99 95 100 98  Resp: (!) 21 18 (!) 21 16  Temp:      TempSrc:      SpO2: 96% 98% 95% 99%  Weight:      Height:        Physical Exam Constitutional:      General: He is not in acute distress.    Appearance: Normal appearance.  HENT:     Head: Normocephalic and atraumatic.     Mouth/Throat:     Mouth: Mucous membranes are moist.     Pharynx: Oropharynx is clear.  Eyes:     Extraocular Movements: Extraocular movements intact.     Pupils: Pupils are equal, round, and reactive to light.  Cardiovascular:     Rate and Rhythm: Normal rate and regular rhythm.     Pulses: Normal pulses.     Heart sounds: Normal heart sounds.  Pulmonary:     Effort: Pulmonary effort is normal. No respiratory distress.     Breath sounds: Rhonchi and rales present.  Abdominal:     General: Bowel sounds are normal. There is no distension.     Palpations: Abdomen is soft.     Tenderness: There is no abdominal tenderness.  Musculoskeletal:        General: No swelling or deformity.  Skin:    General: Skin is warm and dry.  Neurological:     General: No focal deficit present.     Mental Status: Mental status is at baseline.    Labs on Admission: I have personally reviewed following labs and imaging studies  CBC: Recent Labs  Lab 11/14/2021 1519  WBC 20.0*  NEUTROABS 17.9*  HGB 12.0*  HCT 39.4  MCV 102.1*  PLT 765    Basic Metabolic Panel: Recent Labs  Lab 10/31/2021 1519  NA 135  K 4.3  CL 98  CO2 22  GLUCOSE 201*  BUN 18  CREATININE 1.53*  CALCIUM 9.1    GFR: Estimated Creatinine Clearance: 58.8 mL/min (A) (by C-G formula based on SCr of 1.53 mg/dL (H)).  Liver  Function  Tests: Recent Labs  Lab 11/02/2021 1519  AST 45*  ALT 45*  ALKPHOS 56  BILITOT 0.7  PROT 7.1  ALBUMIN 2.9*    Urine analysis:    Component Value Date/Time   COLORURINE AMBER (A) 10/07/2021 2003   APPEARANCEUR CLEAR 10/07/2021 2003   LABSPEC 1.021 10/07/2021 2003   PHURINE 5.0 10/07/2021 2003   GLUCOSEU NEGATIVE 10/07/2021 2003   HGBUR NEGATIVE 10/07/2021 2003   [place] NEGATIVE 10/07/2021 2003   [place] NEGATIVE 10/07/2021 2003   PROTEINUR 30 (A) 10/07/2021 2003   NITRITE NEGATIVE 10/07/2021 2003   LEUKOCYTESUR NEGATIVE 10/07/2021 2003    Radiological Exams on Admission: DG Chest Port 1 View  Result Date: 11/03/2021 CLINICAL DATA:  Sepsis presentation EXAM: PORTABLE CHEST 1 VIEW COMPARISON:  11/12/2021 FINDINGS: Cardiomegaly. Aortic atherosclerosis. Dual lead pacemaker with leads in the region of the right atrium and right ventricle. Chronic lung markings at the left lung base. On the right, patchy infiltrates previously seen have worsened, particularly in the right mid lung, consistent with bronchopneumonia. IMPRESSION: Worsening patchy infiltrates in the right lung consistent with bronchopneumonia. Electronically Signed   By: Nelson Chimes M.D.   On: 11/28/2021 15:47    EKG: Independently reviewed.  Sinus rhythm at 99 bpm.  Minimal baseline artifact.  Possibly atrial paced, PACs also noted.  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    Paroxysmal atrial fibrillation (HCC) s/p Watchman device   CAD (coronary artery disease)   Depression   Anemia   Gastroesophageal reflux disease without esophagitis   Gout   Insomnia   Pacemaker   Restless legs syndrome (RLS)   GAD (generalized anxiety disorder)   Acute renal failure superimposed on stage 3a chronic kidney disease (HCC)   Sick sinus  syndrome s/p PPM    History of CVA (cerebrovascular accident)   Diastolic CHF ([place])   Healthcare associated pneumonia > Patient with history of recurrent pneumonia recently admitted at the end of June for recurrent versus unresolved pneumonia treated with cefepime and steroids also received treatment for diastolic heart failure at that time. > Now presenting with worsening shortness of breath, right-sided chest pain, worsening hypoxia at facility for the past 4 days.  Started on antibiotics 2 days ago with ceftriaxone and Doxy clean but has not yet improved. > In the ED patient noted to have significant leukocytosis to 20 on discharge it was around 12.  Intermittent tachypnea and intermittent tachycardia.  No recorded fever here but does reports subjective fevers at facility. > Chest x-ray does show worsening patchy infiltrate at the right lobe consistent with bronchopneumonia.  CT has been ordered and is pending. > Started on cefepime and vancomycin in the ED for healthcare associated pneumonia and received 2 L IV fluids as well as his home midodrine. - Monitor on telemetry - Continue with vancomycin and cefepime for now given his recurrent pneumonia and healthcare association - Trend fever curve and WBC - Check expectorated sputum culture - Check and trend procalcitonin - Screen for COVID-19 - Pulmonary toilet - Follow-up CT - Follow-up blood cultures, urine cultures, urinalysis  AKI on CKD 3A > Patient did have creatinine of 1.53 from baseline of around 1.  Has continued with diuretics at facility but presented with borderline hypotension so received IV fluids as above. > He does feel that he is retaining some fluid  in his abdomen but no significant edema noted on chest x-ray we will see what CT shows. -Monitor response to IV fluids, will hold off of further IVF given diastolic heart failure - Trend renal function electrolytes - Avoid nephrotoxic agents  Diastolic CHF > Known history  of diastolic CHF last echo April 2023 with a EF 45-62%, grade 2 diastolic dysfunction, normal RV function. > Has remained on Lasix at facility, did present with AKI as above unclear if this is due to worsening hypotension/dehydration in the setting of developing infection versus mild volume overload given he is reporting that he is retaining his abdomen. > Either way due to hypotension he received IV fluids in the ED and will monitor response to this but avoid further fluids as he received 2 L already. - As above monitor response to IV fluids - Restart diuresis as indicated - Strict I's and O's, daily weights - Trend renal function electrolytes  Chronic respiratory failure with hypoxia Possible ILD > On chronic oxygen as above. > Currently complicated by recurrent pneumonia as above. - Continue home Dulera, DuoNebs - As needed albuterol  Orthostatic hypotension - Continue home midodrine  Dementia - Continue home Namenda  Depression Anxiety Insomnia - Continue home Lexapro, Seroquel  RLS - Continue home ropinirole  Essential tremor - Continue home primidone  Sick sinus syndrome status post pacemaker Atrial fibrillation status post Watchman > Rhythm appears to be sinus versus atrial paced in the ED. - Continue home metoprolol  CAD > History of MI and stenting > Troponin flat in the ED at 38 and then 27 on repeat. - Continue home atorvastatin, aspirin, Plavix, metoprolol, ranolazine  GERD - Continue PPI  Gout - Continue home allopurinol  Anemia > Hemoglobin stable at 12 in ED - Trend CBC  Chronic pain - Continue home oxycodone  DVT prophylaxis: Lovenox Code Status:   Full Family Communication:  None on admission.  States his wife is up-to-date but would like to be notified if he gets a bed upstairs. Disposition Plan:   Patient is from:  [place] to:  Same as above  Anticipated DC date:  2 to 7 days  Anticipated DC  barriers: None  Consults called:  None Admission status:  Inpatient, telemetry  Severity of Illness: The appropriate patient status for this patient is INPATIENT. Inpatient status is judged to be reasonable and necessary in order to provide the required intensity of service to ensure the patient's safety. The patient's presenting symptoms, physical exam findings, and initial radiographic and laboratory data in the context of their chronic comorbidities is felt to place them at high risk for further clinical deterioration. Furthermore, it is not anticipated that the patient will be medically stable for discharge from the hospital within 2 midnights of admission.   * I certify that at the point of admission it is my clinical judgment that the patient will require inpatient hospital care spanning beyond 2 midnights from the point of admission due to high intensity of service, high risk for further deterioration and high frequency of surveillance required.Marcelyn Bruins MD [facility]  How to contact the [facility] Attending or Consulting provider [place] or covering provider during after hours [place], for this patient?   Check the care team in [facility] and look for a) attending/consulting TR[facility] provider listed and b) the [facility] team listed Log into [email or web address] and use Media's universal password to access. If you do not have  the password, please contact the hospital operator. Locate the [facility] provider you are looking for under [facility] and page to a number that you can be directly reached. If you still have difficulty reaching the provider, please page the [facility] (Director on Call) for the Hospitalists listed on amion for assistance.  10/31/2021, 7:43 PM

## 2021-11-16 ENCOUNTER — Encounter (HOSPITAL_COMMUNITY): Payer: Self-pay | Admitting: Internal Medicine

## 2021-11-16 ENCOUNTER — Inpatient Hospital Stay (HOSPITAL_COMMUNITY): Payer: Medicare Other

## 2021-11-16 DIAGNOSIS — I5032 Chronic diastolic (congestive) heart failure: Secondary | ICD-10-CM | POA: Diagnosis not present

## 2021-11-16 DIAGNOSIS — J189 Pneumonia, unspecified organism: Secondary | ICD-10-CM | POA: Diagnosis not present

## 2021-11-16 DIAGNOSIS — N1831 Chronic kidney disease, stage 3a: Secondary | ICD-10-CM | POA: Diagnosis not present

## 2021-11-16 DIAGNOSIS — N179 Acute kidney failure, unspecified: Secondary | ICD-10-CM | POA: Diagnosis not present

## 2021-11-16 LAB — COMPREHENSIVE METABOLIC PANEL
ALT: 31 U/L (ref 0–44)
AST: 23 U/L (ref 15–41)
Albumin: 2.3 g/dL — ABNORMAL LOW (ref 3.5–5.0)
Alkaline Phosphatase: 43 U/L (ref 38–126)
Anion gap: 9 (ref 5–15)
BUN: 17 mg/dL (ref 8–23)
CO2: 25 mmol/L (ref 22–32)
Calcium: 8.6 mg/dL — ABNORMAL LOW (ref 8.9–10.3)
Chloride: 103 mmol/L (ref 98–111)
Creatinine, Ser: 1.12 mg/dL (ref 0.61–1.24)
GFR, Estimated: >60 mL/min (ref >60)
Glucose, Bld: 102 mg/dL — ABNORMAL HIGH (ref 70–99)
Potassium: 3.8 mmol/L (ref 3.5–5.1)
Sodium: 137 mmol/L (ref 135–145)
Total Bilirubin: 0.5 mg/dL (ref 0.3–1.2)
Total Protein: 5.7 g/dL — ABNORMAL LOW (ref 6.5–8.1)

## 2021-11-16 LAB — CBC
HCT: 31.5 % — ABNORMAL LOW (ref 39.0–52.0)
Hemoglobin: 9.8 g/dL — ABNORMAL LOW (ref 13.0–17.0)
MCH: 31.1 pg (ref 26.0–34.0)
MCHC: 31.1 g/dL (ref 30.0–36.0)
MCV: 100 fL (ref 80.0–100.0)
Platelets: 120 10*3/uL — ABNORMAL LOW (ref 150–400)
RBC: 3.15 MIL/uL — ABNORMAL LOW (ref 4.22–5.81)
RDW: 16.3 % — ABNORMAL HIGH (ref 11.5–15.5)
WBC: 11.3 10*3/uL — ABNORMAL HIGH (ref 4.0–10.5)
nRBC: 0 % (ref 0.0–0.2)

## 2021-11-16 LAB — PROCALCITONIN: Procalcitonin: 0.48 ng/mL

## 2021-11-16 MED ORDER — OXYCODONE-ACETAMINOPHEN 5-325 MG PO TABS
1.0000 | ORAL_TABLET | Freq: Once | ORAL | Status: AC
  Administered 2021-11-17: 1 via ORAL
  Filled 2021-11-16: qty 1

## 2021-11-16 MED ORDER — METHYLPREDNISOLONE SODIUM SUCC 40 MG IJ SOLR
40.0000 mg | Freq: Every day | INTRAMUSCULAR | Status: DC
  Administered 2021-11-16 – 2021-11-18 (×3): 40 mg via INTRAVENOUS
  Filled 2021-11-16 (×3): qty 1

## 2021-11-16 MED ORDER — MOMETASONE FURO-FORMOTEROL FUM 200-5 MCG/ACT IN AERO
2.0000 | INHALATION_SPRAY | Freq: Two times a day (BID) | RESPIRATORY_TRACT | Status: DC
  Administered 2021-11-16 – 2021-11-21 (×10): 2 via RESPIRATORY_TRACT
  Filled 2021-11-16: qty 8.8

## 2021-11-16 MED ORDER — FLUCONAZOLE 100MG IVPB
100.0000 mg | INTRAVENOUS | Status: DC
  Administered 2021-11-16 – 2021-11-19 (×4): 100 mg via INTRAVENOUS
  Filled 2021-11-16 (×5): qty 50

## 2021-11-16 MED ORDER — SACCHAROMYCES BOULARDII 250 MG PO CAPS
250.0000 mg | ORAL_CAPSULE | Freq: Two times a day (BID) | ORAL | Status: DC
  Administered 2021-11-16 – 2021-11-19 (×8): 250 mg via ORAL
  Filled 2021-11-16 (×14): qty 1

## 2021-11-16 NOTE — ED Notes (Signed)
Breakfast order placed

## 2021-11-16 NOTE — Progress Notes (Signed)
SLP Cancellation Note  Patient Details Name: ORLA JOLLIFF MRN: [id number] DOB: 20-Oct-1950   Cancelled treatment:       Reason Eval/Treat Not Completed: Other (comment) (transportation conflict prevented MBS from being completed this PM. Will plan to complete MBS in AM next date.)  Sonia Baller, MA, CCC-SLP Speech Therapy

## 2021-11-16 NOTE — Progress Notes (Signed)
Pt arrived to the floor around 1545 via stretcher by transporter. Pt transferred from stretcher to bed with 2 person assist. Pt alert and oriented x4 in no acute distress. VSS. Respirations even and non labored on 3 L/min O2. Pt oriented to room. Pt encouraged to use call bell for assistance.Call bell within reach.

## 2021-11-16 NOTE — Progress Notes (Signed)
PROGRESS NOTE    Mark Thomas  QBH:[id number] DOB: 1950/07/12 DOA: 11/10/2021 PCP: Luetta Nutting, DO    Chief Complaint  Patient presents with   Pneumonia    Brief Narrative:   Mark Thomas is a 71 y.o. male with medical history significant of recurrent pneumonia, pleural effusion, dementia, orthostatic hypotension, atrial fibrillation status post watchman, CAD status post stent, CKD 3, depression, anxiety, insomnia, anemia, GERD, gout, sick sinus syndrome status post pacemaker, RLS, essential tremor, diastolic CHF, chronic respiratory failure with hypoxia presenting with shortness of breath and recurrent pneumonia. -Patient with multiple recent hospitalizations over the last few months pneumonia, most recent from June 20 to June 27 for respiratory failure secondary to recurrent versus unresolved pneumonia as well as a component of heart failure, was discharged on 3 L.  At that time he was treated with 5 days of cefepime and steroids and was placed on prednisone taper which he remains on.  Recently started on ceftriaxone and doxycycline at the facility for another episode of pneumonia, without significant improvement, worsening dyspnea and tachypnea, so he was sent to [facility] ED for further management.   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    Paroxysmal atrial fibrillation (HCC) s/p Watchman device   CAD (coronary artery disease)   Depression   Anemia   Gastroesophageal reflux disease without esophagitis   Gout   Insomnia   Pacemaker   Restless legs syndrome (RLS)   GAD (generalized anxiety disorder)   Acute renal failure superimposed on stage 3a chronic kidney disease (HCC)   Sick sinus syndrome s/p PPM    History of CVA (cerebrovascular accident)   Diastolic CHF  ([facility])   Healthcare associated pneumonia Recurrent pneumonia - Patient with history of recurrent pneumonia recently admitted at the end of June for recurrent versus unresolved pneumonia treated with cefepime and steroids also received treatment for diastolic heart failure at that time. -Patient had extensive course over last 3 to 46-month with multiple admissions and treatment for up pneumonia, with close follow-up by pulmonary, with a bronchoscopy this June, which was nondiagnostic for any infectious etiology, and BAL did not show eosinophils -Plan remains aspiration, less likely CVA OP/other inflammatory pneumonia. -Continue with current antibiotic regimen,. -Encouraged to use incentive spirometry and flutter valve. -Given low low risk for aspiration with work-up last April, given high clinical concern, SLP has been consulted, likely MBS in a.m. -Started on IV steroids  AKI on CKD 3A - Patient did have creatinine of 1.53 from baseline of around 1.  Has continued with diuretics at facility but presented with borderline hypotension so received IV fluids as above. - Avoid nephrotoxic agents  Chronic diastolic CHF - Known history of diastolic CHF last echo April 2023 with a EF 709-73% grade 2 diastolic dysfunction, normal RV function. -Received IV fluid initially in ED given AKI and hypotension, monitor closely for volume overload  Chronic respiratory failure with hypoxia Possible ILD - On chronic oxygen as above. - Continue home Dulera, DuoNebs - As needed albuterol   Orthostatic hypotension - Continue home midodrine  Dementia - Continue home Namenda  Depression Anxiety Insomnia - Continue home Lexapro, Seroquel  RLS - Continue home ropinirole  Essential tremor - Continue home primidone  Sick sinus syndrome status post pacemaker Atrial fibrillation status post Watchman > Rhythm appears to be  sinus versus atrial paced in the ED. - Continue home metoprolol  CAD > History  of MI and stenting > Troponin flat in the ED at 38 and then 27 on repeat. - Continue home atorvastatin, aspirin, Plavix, metoprolol, ranolazine  GERD - Continue PPI  Gout - Continue home allopurinol  Anemia - Hemoglobin stable at 12 in ED - Trend CBC  Chronic pain - Continue home oxycodone     DVT prophylaxis: Lovenox Code Status: Full Family Communication: none at bedside Disposition:   Status is: Inpatient Remains inpatient appropriate because   Consultants:  PCCM   Subjective:  Reports cough, he denies any aspiration, no fever, no chills  Objective: Vitals:   11/16/21 1115 11/16/21 1142 11/16/21 1400 11/16/21 1406  BP: 130/82  (!) 142/77   Pulse: 92  88   Resp: 20  (!) 22   Temp:  98.3 F (36.8 C)    TempSrc:      SpO2: 92%  98% 98%  Weight:      Height:        Intake/Output Summary (Last 24 hours) at 11/16/2021 1526 Last data filed at 11/16/2021 0549 Gross per 24 hour  Intake 2500 ml  Output --  Net 2500 ml   Filed Weights   11/28/2021 1558  Weight: 105.2 kg    Examination:  Awake Alert, Oriented X 3, No new F.N deficits, Normal affect Symmetrical Chest wall movement, Good air movement bilaterally, Rales at right lung base RRR,No Gallops,Rubs or new Murmurs, No Parasternal Heave +ve B.Sounds, Abd Soft, No tenderness, No rebound - guarding or rigidity. No Cyanosis, Clubbing or edema, No new Rash or bruise   .     Data Reviewed: I have personally reviewed following labs and imaging studies  CBC: Recent Labs  Lab 10/30/2021 1519 11/16/21 0413  WBC 20.0* 11.3*  NEUTROABS 17.9*  --   HGB 12.0* 9.8*  HCT 39.4 31.5*  MCV 102.1* 100.0  PLT 198 120*    Basic Metabolic Panel: Recent Labs  Lab 11/05/2021 1519 11/16/21 0413  NA 135 137  K 4.3 3.8  CL 98 103  CO2 22 25  GLUCOSE 201* 102*  BUN 18 17  CREATININE 1.53* 1.12  CALCIUM 9.1 8.6*    GFR: Estimated Creatinine Clearance: 80.4 mL/min (by C-G formula based on SCr of 1.12  mg/dL).  Liver Function Tests: Recent Labs  Lab 11/22/2021 1519 11/16/21 0413  AST 45* 23  ALT 45* 31  ALKPHOS 56 43  BILITOT 0.7 0.5  PROT 7.1 5.7*  ALBUMIN 2.9* 2.3*    CBG: No results for input(s): "GLUCAP" in the last 168 hours.   Recent Results (from the past 240 hour(s))  Blood Culture (routine x 2)     Status: None (Preliminary result)   Collection Time: 11/26/2021  3:54 PM   Specimen: BLOOD  Result Value Ref Range Status   Specimen Description BLOOD LEFT ANTECUBITAL  Final   Special Requests   Final    BOTTLES DRAWN AEROBIC AND ANAEROBIC Blood Culture adequate volume   Culture   Final    NO GROWTH < 24 HOURS Performed at [facility], [address], [place], [place] [address]    Report Status PENDING  Incomplete  MRSA Next Gen by PCR, Nasal     Status: None   Collection Time: 11/26/2021  4:27 PM   Specimen: Nasal Mucosa; Nasal Swab  Result Value Ref Range Status   MRSA by PCR Next Gen NOT DETECTED  NOT DETECTED Final    Comment: (NOTE) The GeneXpert MRSA Assay (FDA approved for NASAL specimens only), is one component of a comprehensive MRSA colonization surveillance program. It is not intended to diagnose MRSA infection nor to guide or monitor treatment for MRSA infections. Test performance is not FDA approved in patients less than 79 years old. Performed at [facility], [place] [address] [place], [place] [address]   SARS Coronavirus 2 by RT PCR (hospital order, performed in [facility] hospital lab) *cepheid single result test* Anterior Nasal Swab     Status: None   Collection Time: 11/13/2021  7:42 PM   Specimen: Anterior Nasal Swab  Result Value Ref Range Status   SARS Coronavirus 2 by RT PCR NEGATIVE NEGATIVE Final    Comment: (NOTE) SARS-CoV-2 target nucleic acids are NOT DETECTED.  The SARS-CoV-2 RNA is generally detectable in upper and lower respiratory specimens during the acute phase of infection. The lowest concentration of SARS-CoV-2 viral  copies this assay can detect is 250 copies / mL. A negative result does not preclude SARS-CoV-2 infection and should not be used as the sole basis for treatment or other patient management decisions.  A negative result may occur with improper specimen collection / handling, submission of specimen other than nasopharyngeal swab, presence of viral mutation(s) within the areas targeted by this assay, and inadequate number of viral copies (<250 copies / mL). A negative result must be combined with clinical observations, patient history, and epidemiological information.  Fact Sheet for Patients:   https://www.patel.info/  Fact Sheet for Healthcare Providers: https://hall.com/  This test is not yet approved or  cleared by the [place] FDA and has been authorized for detection and/or diagnosis of SARS-CoV-2 by FDA under an Emergency Use Authorization (EUA).  This EUA will remain in effect (meaning this test can be used) for the duration of the COVID-19 declaration under Section 564(b)(1) of the Act, 21 U.S.C. section 360bbb-3(b)(1), unless the authorization is terminated or revoked sooner.  Performed at [facility], [place] [address], [place], [place] [address]          Radiology Studies: CT Chest Wo Contrast  Result Date: 11/16/2021 CLINICAL DATA:  Pneumonia, complication suspected, xray done Chest pain and shortness of breath. EXAM: CT CHEST WITHOUT CONTRAST TECHNIQUE: Multidetector CT imaging of the chest was performed following the standard protocol without IV contrast. RADIATION DOSE REDUCTION: This exam was performed according to the departmental dose-optimization program which includes automated exposure control, adjustment of the mA and/or kV according to patient size and/or use of iterative reconstruction technique. COMPARISON:  Radiograph earlier today.  Chest CT 10/08/2021 FINDINGS: Cardiovascular: Stable cardiomegaly. Aortic  atherosclerosis and tortuosity. No aortic aneurysm. Left atrial septal occlusion device. Atrial septal occlusion device. Pacemaker wires with tip in the right ventricle and right atrial appendage. No pericardial effusion. Mediastinum/Nodes: Increasing mediastinal adenopathy, right paratracheal node measures 16 mm, series 3, image 50, previously 10 mm. Additional prominent and mildly enlarged mediastinal lymph nodes are similar or slightly increased in size. 13 mm subcarinal node, series 3, image 68, unchanged. Limited assessment for hilar adenopathy on this unenhanced exam. No visualized thyroid nodule. Wall thickening of the distal esophagus with similar small to moderate hiatal hernia. Lungs/Pleura: Shifting pulmonary opacities from prior exam. The left lower lobe airspace disease has significantly improved with residual areas of ground-glass. Additional mild areas of ground-glass involving the left upper lobe. There is worsening patchy and ground-glass opacity throughout the right lung with  new consolidative components in the right upper lobe. Central air bronchograms within the consolidated components. Subpleural reticulation and interstitial thickening in the basilar right middle and lower lobes. Decreased pleural effusions with only trace residual, more so on the right. No pneumothorax. The trachea and central bronchi are unremarkable. Upper Abdomen: No acute upper abdominal findings. Stable exophytic cyst from the upper right kidney, needing no further follow-up. Renal renal lesions which were fully included on abdominal CT 08/26/2021. Nonobstructing upper pole right renal stone. Musculoskeletal: Chronic L1 compression fracture when compared with 08/26/2021 abdominal CT. There are no acute or suspicious osseous abnormalities. IMPRESSION: 1. Shifting pulmonary opacities from CT last month. Worsening patchy and ground-glass opacity throughout the right lung with new consolidative components in the right upper  lobe. There is overall improvement in the left lung opacities from prior. Differential consideration is broad and includes both infectious and noninfectious etiologies. Cryptogenic organizing pneumonia, or other inflammatory conditions are considered. 2. Decreased pleural effusions with only trace residual, more so on the right. 3. Increasing mediastinal adenopathy, likely reactive. 4. Stable cardiomegaly. 5. Distal esophageal wall thickening with small to moderate hiatal hernia, can be seen with reflux or esophagitis. Aortic Atherosclerosis (ICD10-I70.0). Electronically Signed   By: Keith Rake M.D.   On: 11/04/2021 23:47   DG Chest Port 1 View  Result Date: 11/19/2021 CLINICAL DATA:  Sepsis presentation EXAM: PORTABLE CHEST 1 VIEW COMPARISON:  11/12/2021 FINDINGS: Cardiomegaly. Aortic atherosclerosis. Dual lead pacemaker with leads in the region of the right atrium and right ventricle. Chronic lung markings at the left lung base. On the right, patchy infiltrates previously seen have worsened, particularly in the right mid lung, consistent with bronchopneumonia. IMPRESSION: Worsening patchy infiltrates in the right lung consistent with bronchopneumonia. Electronically Signed   By: Nelson Chimes M.D.   On: 10/31/2021 15:47        Scheduled Meds:  allopurinol  100 mg Oral Daily   aspirin EC  81 mg Oral Daily   atorvastatin  80 mg Oral Daily   clopidogrel  75 mg Oral Daily   enoxaparin (LOVENOX) injection  40 mg Subcutaneous Q24H   escitalopram  20 mg Oral Daily   ipratropium-albuterol  3 mL Nebulization BID   latanoprost  1 drop Both Eyes QHS   memantine  10 mg Oral BID   methylPREDNISolone (SOLU-MEDROL) injection  40 mg Intravenous Daily   metoprolol tartrate  37.5 mg Oral BID   midodrine  2.5 mg Oral TID WC   mometasone-formoterol  2 puff Inhalation BID   oxyCODONE-acetaminophen  0.5 tablet Oral Q8H   pantoprazole  40 mg Oral QPM   primidone  100 mg Oral BID   QUEtiapine  100 mg Oral  QHS   ranolazine  1,000 mg Oral BID   rOPINIRole  4 mg Oral QHS   saccharomyces boulardii  250 mg Oral BID   sodium chloride flush  3 mL Intravenous Q12H   Continuous Infusions:  ceFEPime (MAXIPIME) IV Stopped (11/16/21 1443)   fluconazole (DIFLUCAN) IV Stopped (11/16/21 1444)   vancomycin       LOS: 1 day      Phillips Climes, MD [facility]   To contact the attending provider between 7A-7P or the covering provider during after hours 7P-7A, please log into the web site [email or web address] and access using universal [place] password for that web site. If you do not have the password, please call the hospital operator.  11/16/2021, 3:26 PM

## 2021-11-16 NOTE — Consult Note (Signed)
NAME:  Mark Thomas, MRN:  [id number], DOB:  06-02-1950, LOS: 1 ADMISSION DATE:  11/14/2021, CONSULTATION DATE:  11/16/2021  REFERRING MD:  Waldron Labs, TRH, CHIEF COMPLAINT: Recurrent pneumonia  History of Present Illness:  71 year old never smoker that we are asked to evaluate for recurrent pneumonia. He was initially seen as a pulmonary consult 09/15/2018 for ILD and recurrent pneumonia.  He subsequently developed left hemi- opacity of his lung and was hospitalized multiple times.  He was treated with prolonged and multiple courses of IV antibiotics.  He required oxygen and was subsequently weaned off.  He is now a resident of [facility] for the last 6 weeks.  Bronchoscopy on 6/7 with BAL was negative for AFB, fungal and bacterial cultures. Swallowing evaluation 07/2021 showed mild aspiration risk and mild dysphagia Was last hospitalization for 6/20 to 6/27 for recurrent pneumonia with a component of acute diastolic heart failure.  He was diuresed, treated with cefepime and steroids and discharged on 3 L of oxygen. He developed shortness of breath, right-sided chest pain and hypoxia for the last 4 days prior to this admission.  He was started on ceftriaxone and doxycycline.  He was also on Lasix and prednisone .  Chest x-ray 7/13 showed right more than left infiltrate He was seen by our nurse practitioner on 7/14 for an office visit.  Chest x-ray 7/14 showed asymmetric airspace disease in the right mid and lower lung He developed subjective fevers and cough and sought ED attention on 7/17 Labs showed leukocytosis 20 K increased from 12, creatinine 1.5, BNP 299, lactate was 2.9 and 2.5 Chest x-ray showed worsening infiltrates on the right in a patchy pattern consistent with bronchopneumonia.  He was started on cefepime and vancomycin and PCCM consulted Pertinent  Medical History  Atrial fibrillation status post Watchman device CAD status post stent, on Plavix Sick sinus syndrome status post  pacemaker RLS, essential tremor HFpEF Chronic respiratory failure with hypoxia CKD stage III  Significant Hospital Events: Including procedures, antibiotic start and stop dates in addition to other pertinent events   CT chest without con 06/2021 patchy groundglass nodules in right upper lobe CTA chest 07/2021 mild peripheral interstitial opacities in lower lung?  ILD, patchy groundglass opacities in bilateral upper lobes CT chest without con 10/2021 left lung opacities have improved, worsening patchy groundglass opacity throughout the right lung, trace effusions , distal esophageal wall thickening with moderate hiatal hernia Swallow evaluation/MBS 07/2021 mild aspiration risk, no restriction  Interim History / Subjective:  Afebrile Complains of cough and mild shortness of breath Denies choking episodes in the last 2 to 3 weeks  Objective   Blood pressure 129/77, pulse 96, temperature 98.1 F (36.7 C), temperature source Oral, resp. rate (!) 22, height _0  (1.854 m), weight 105.2 kg, SpO2 92 %.        Intake/Output Summary (Last 24 hours) at 11/16/2021 1050 Last data filed at 11/16/2021 0549 Gross per 24 hour  Intake 2500 ml  Output --  Net 2500 ml   Filed Weights   11/05/2021 1558  Weight: 105.2 kg    Examination: General: Obese, chronically ill-appearing man, lying in ED stretcher, no distress HENT: Mild pallor, no icterus, no JVD, oral thrush + Lungs: Crackles right base, no accessory muscle use, no rhonchi Cardiovascular: S1-S2 regular, ESM 3/6 at base Abdomen: Soft, obese, nontender, no hepatosplenomegaly Extremities: No edema, no deformity Neuro: Alert oriented x3, no focal deficits   Labs show procalcitonin 0.48, LA BC 11.3, platelets 120 ,  creatinine 1.1 Resolved Hospital Problem list     Assessment & Plan:  Recurrent pneumonia.  He has an extensive course over the last 3 to 4 months with progressive left white out of his left lung which is now resolved.  He is now  developing extensive infiltrates on the right .  Bronchoscopy 09/2021 was nondiagnostic for infectious etiology and BAL did not show eosinophils. Differential in the setting includes mainly aspiration pneumonia and possibly COP/other inflammatory pneumonia.  Eosinophilic pneumonia seems unlikely given lack of eosinophils on BAL and slower response to steroids.  Repeat bronchoscopy would be low yield and since he is on Plavix, transbronchial biopsy would be contraindicated  -Agree with empiric antibiotic biotics, even though procalcitonin is low.  He would clearly be at high risk for C. difficile colitis and probiotic should be added -Repeat swallow evaluation/MBS -Add IV steroids Solu-Medrol 40 every 24 with plan for prolonged taper.   Oral thrush -we will add IV Diflucan, possible that he has more extensive thrush in his GI tract -Given findings of esophageal thickening and reflux esophagitis/moderate lateral hernia, would suggest GI evaluation and possible EGD since no other cause identified  Best Practice (right click and "Reselect all SmartList Selections" daily)   Diet/type: Regular consistency (see orders) DVT prophylaxis: LMWH GI prophylaxis: PPI Lines: N/A Foley:  N/A Code Status:  full code Last date of multidisciplinary goals of care discussion [NA]  Labs   CBC: Recent Labs  Lab 11/13/2021 1519 11/16/21 0413  WBC 20.0* 11.3*  NEUTROABS 17.9*  --   HGB 12.0* 9.8*  HCT 39.4 31.5*  MCV 102.1* 100.0  PLT 198 120*    Basic Metabolic Panel: Recent Labs  Lab 11/25/2021 1519 11/16/21 0413  NA 135 137  K 4.3 3.8  CL 98 103  CO2 22 25  GLUCOSE 201* 102*  BUN 18 17  CREATININE 1.53* 1.12  CALCIUM 9.1 8.6*   GFR: Estimated Creatinine Clearance: 80.4 mL/min (by C-G formula based on SCr of 1.12 mg/dL). Recent Labs  Lab 11/14/2021 1519 11/26/2021 1554 11/10/2021 1737 11/01/2021 1754 11/16/21 0413  PROCALCITON  --   --   --  0.52 0.48  WBC 20.0*  --   --   --  11.3*   LATICACIDVEN  --  2.9* 2.5*  --   --     Liver Function Tests: Recent Labs  Lab 11/21/2021 1519 11/16/21 0413  AST 45* 23  ALT 45* 31  ALKPHOS 56 43  BILITOT 0.7 0.5  PROT 7.1 5.7*  ALBUMIN 2.9* 2.3*   No results for input(s): "LIPASE", "AMYLASE" in the last 168 hours. No results for input(s): "AMMONIA" in the last 168 hours.  ABG    Component Value Date/Time   HCO3 31.4 (H) 10/19/2021 1228   TCO2 33 (H) 10/19/2021 1228   O2SAT 82 10/19/2021 1228     Coagulation Profile: Recent Labs  Lab 11/16/2021 1519  INR 1.0    Cardiac Enzymes: No results for input(s): "CKTOTAL", "CKMB", "CKMBINDEX", "TROPONINI" in the last 168 hours.  HbA1C: Hgb A1c MFr Bld  Date/Time Value Ref Range Status  01/08/2021 01:38 AM 6.0 (H) 4.8 - 5.6 % Final    Comment:    (NOTE) Pre diabetes:          5.7%-6.4%  Diabetes:              >6.4%  Glycemic control for   <7.0% adults with diabetes     CBG: No results for input(s): "GLUCAP"  in the last 168 hours.  Review of Systems:   Shortness of breath Cough, nonproductive Choking episodes with pills in the past, no such episodes in the past 2 to 3 weeks Leg spasms during sleep, relieved by Ativan  Past Medical History:  He,  has a past medical history of Anxiety, Coronary artery disease, Depression, Essential tremor, GERD (gastroesophageal reflux disease), High cholesterol, Hypertension, Myocardial infarct ([place]), Orthostatic hypotension, Stroke [facility]), Stroke ([place]) (02/02/2021), and Vascular dementia ([place]).   Surgical History:   Past Surgical History:  Procedure Laterality Date   APPENDECTOMY     CARDIAC SURGERY     CHOLECYSTECTOMY     FLEXIBLE BRONCHOSCOPY N/A 10/06/2021   Procedure: FLEXIBLE BRONCHOSCOPY;  Surgeon: Margaretha Seeds, MD;  Location: [facility];  Service: Cardiopulmonary;  Laterality: N/A;   HERNIA REPAIR     NASAL SINUS SURGERY     SHOULDER SURGERY       Social History:   reports that he has never smoked. He has never  been exposed to tobacco smoke. He has never used smokeless tobacco. He reports that he does not currently use alcohol. He reports that he does not use drugs.   Family History:  His family history includes Hypertension in his father and mother; Stroke in his father and mother.   Allergies Allergies  Allergen Reactions   Dilaudid [Hydromorphone] Other (See Comments)    Respiratory Arrest!!!!   Bactrim [Sulfamethoxazole-Trimethoprim] Rash   Benadryl [Diphenhydramine] Other (See Comments)    Muscle spasms   Phenobarbital Other (See Comments)    From childhood- reaction not recalled at this time   Penicillins Other (See Comments)    From childhood- reaction not recalled at this time     Home Medications  Prior to Admission medications   Medication Sig Start Date End Date Taking? Authorizing Provider  acetaminophen (TYLENOL) 500 MG tablet Take 1,000 mg by mouth 2 (two) times daily. 10/28/21  Yes [provider]  albuterol (PROVENTIL) (2.5 MG/3ML) 0.083% nebulizer solution Take 3 mLs (2.5 mg total) by nebulization every 6 (six) hours as needed for wheezing or shortness of breath. Patient taking differently: Take 2.5 mg by nebulization every 4 (four) hours as needed for shortness of breath. 07/15/21  Yes Luetta Nutting, DO  albuterol (VENTOLIN HFA) 108 (90 Base) MCG/ACT inhaler Inhale 2 puffs into the lungs every 6 (six) hours as needed for wheezing or shortness of breath. 10/26/21  Yes [provider]  allopurinol (ZYLOPRIM) 100 MG tablet Take 100 mg by mouth daily. 09/22/20  Yes [provider]  aspirin 81 MG EC tablet Take 81 mg by mouth daily. 05/05/14  Yes [provider]  atorvastatin (LIPITOR) 80 MG tablet Take 80 mg by mouth daily. 11/18/20  Yes [provider]  b complex vitamins capsule Take 1 capsule by mouth daily.   Yes [provider]  cefTRIAXone (ROCEPHIN) 1 g injection Inject 1 g into the muscle daily. Ceftriaxone injection to be  mixed with 2.1 mL of lidocaine 1% to avoid pain at injection site. 11/14/21 11/17/21 Yes [provider]  Cholecalciferol (VITAMIN D3) 10 MCG (400 UNIT) CAPS Take 400 Units by mouth in the morning.   Yes [provider]  clopidogrel (PLAVIX) 75 MG tablet Take 75 mg by mouth daily. 11/18/20  Yes [provider]  dextromethorphan-guaiFENesin (MUCINEX DM) 30-600 MG 12hr tablet Take 1 tablet by mouth 2 (two) times daily. 10/26/21  Yes [provider]  doxycycline (VIBRA-TABS) 100 MG tablet Take  100 mg by mouth 2 (two) times daily. 11/13/21 11/18/21 Yes [provider]  escitalopram (LEXAPRO) 20 MG tablet Take 1 tablet (20 mg total) by mouth daily. Patient taking differently: Take 30 mg by mouth daily. 11/04/21  Yes Merian Capron, MD  ferrous sulfate 325 (65 FE) MG EC tablet Take 1 tablet (325 mg total) by mouth in the morning and at bedtime. Patient taking differently: Take 325 mg by mouth every other day. 03/19/21 11/22/2021 Yes Luetta Nutting, DO  furosemide (LASIX) 20 MG tablet Take 20 mg by mouth every other day. 10/29/21  Yes [provider]  ipratropium-albuterol (DUONEB) 0.5-2.5 (3) MG/3ML SOLN Take 3 mLs by nebulization every 8 (eight) hours. 10/18/21  Yes [provider]  latanoprost (XALATAN) 0.005 % ophthalmic solution Place 1 drop into both eyes at bedtime. 12/07/20  Yes [provider]  lidocaine (XYLOCAINE) 1 % (with preservative) injection 2.1 mLs See admin instructions. Ceftriaxone injection to be mixed with 2.1 mL of lidocaine 1% 11/13/21  Yes [provider]  magnesium oxide (MAG-OX) 400 MG tablet Take 400 mg by mouth daily.   Yes [provider]  memantine (NAMENDA) 10 MG tablet TAKE 1 TABLET TWICE A DAY Patient taking differently: Take 10 mg by mouth 2 (two) times daily. 09/21/21  Yes Alric Ran, MD  Metoprolol Tartrate 37.5 MG TABS Take 37.5 mg by mouth 2 (two) times daily. 10/26/21  Yes Shawna Clamp, MD   midodrine (PROAMATINE) 5 MG tablet Take 0.5 tablets (2.5 mg total) by mouth 3 (three) times daily with meals. 10/14/21  Yes Ghimire, Henreitta Leber, MD  mometasone-formoterol (DULERA) 200-5 MCG/ACT AERO Inhale 2 puffs into the lungs 2 (two) times daily. 09/14/21  Yes Freddi Starr, MD  nitroGLYCERIN (NITROLINGUAL) 0.4 MG/SPRAY spray Place 1 spray under the tongue every 5 (five) minutes x 3 doses as needed for chest pain. 05/05/14  Yes [provider]  oxyCODONE-acetaminophen (PERCOCET/ROXICET) 5-325 MG tablet Take 1 tablet by mouth every 12 (twelve) hours as needed for moderate pain. Patient taking differently: Take 0.5 tablets by mouth every 12 (twelve) hours. 10/26/21  Yes Shawna Clamp, MD  pantoprazole (PROTONIX) 40 MG tablet Take 1 tablet (40 mg total) by mouth every evening. 07/07/21  Yes Luetta Nutting, DO  polyethylene glycol (MIRALAX / GLYCOLAX) 17 g packet Take 17 g by mouth daily. 17 grams, oral, once a day, bowels-in 6-8 oz water or juice   Yes [provider]  predniSONE (DELTASONE) 10 MG tablet Take 20 mg by mouth daily. Give 2 tabs (50m) daily for 1 week for inflammation 11/12/21 11/19/21 Yes [provider]  primidone (MYSOLINE) 50 MG tablet Take 2 tablets (100 mg total) by mouth 2 (two) times daily. 08/24/21 11/22/21 Yes Camara, AMaryan Puls MD  Probiotic, Lactobacillus, CAPS Take 1 capsule by mouth daily. Lactobacillus combination no.4, 5 billion cell capsule. Give 1 capsule po daily for 10 days for supplement. 11/12/21 11/21/21 Yes [provider]  QUEtiapine (SEROQUEL) 100 MG tablet Take 100 mg by mouth at bedtime. 10/26/21  Yes [provider]  ranolazine (RANEXA) 1000 MG SR tablet Take 1,000 mg by mouth in the morning and at bedtime.   Yes [provider]  rOPINIRole (REQUIP XL) 4 MG 24 hr tablet Take 1 tablet (4 mg total) by mouth at bedtime. 09/23/21 09/18/22 Yes CAlric Ran MD  Tiotropium Bromide Monohydrate 2.5 MCG/ACT AERS Inhale 2 each  into the lungs daily. 11/14/21  Yes [provider]  triamcinolone ointment (KENALOG) 0.1 %  Apply 1 application. topically daily as needed (to affected areas- for psoriasis flares). 01/21/21  Yes [provider]  QUEtiapine (SEROQUEL) 50 MG tablet Take 2 tablets (100 mg total) by mouth at bedtime. Patient not taking: Reported on 11/14/2021 11/04/21 02/02/22  Merian Capron, MD  FLUoxetine (PROZAC) 20 MG capsule Take 1 capsule (20 mg total) by mouth daily. 09/21/21 09/28/21  Merian Capron, MD     Kara Mead MD. FCCP. [facility] Pager : [phone]  If no response to pager , please call [phone] until 7 pm After 7:00 pm call Elink  [phone]   11/16/2021

## 2021-11-16 NOTE — ED Notes (Signed)
ED TO INPATIENT HANDOFF REPORT  ED Nurse Name and Phone #: Davene Costain [phone]  S Name/Age/Gender Mark Thomas 70 y.o. male Room/Bed: 042C/042C  Code Status   Code Status: Full Code  Home/SNF/Other Home Patient oriented to: self, place, and time Is this baseline? Yes   Triage Complete: Triage complete  Chief Complaint HCAP (healthcare-associated pneumonia) [J18.9]  Triage Note Pt bib [facility] c/o shortness of breath and worsening pneumonia. Started antibiotics on Saturday and is still taking them but shortness of breath has worsened. Pt arrives at 89% on 4L home o2.    Allergies Allergies  Allergen Reactions   Dilaudid [Hydromorphone] Other (See Comments)    Respiratory Arrest!!!!   Bactrim [Sulfamethoxazole-Trimethoprim] Rash   Benadryl [Diphenhydramine] Other (See Comments)    Muscle spasms   Phenobarbital Other (See Comments)    From childhood- reaction not recalled at this time   Penicillins Other (See Comments)    From childhood- reaction not recalled at this time    Level of Care/Admitting Diagnosis ED Disposition     ED Disposition  Admit   Condition  --   [place]: [facility] [100100]  Level of Care: Telemetry Medical [104]  May admit patient to [facility] or [facility] if equivalent level of care is available:: No  Covid Evaluation: Symptomatic Person Under Investigation (PUI) or recent exposure (last 10 days) *Testing Required*  Diagnosis: HCAP (healthcare-associated pneumonia) [696295]  Admitting Physician: Marcelyn Bruins [[id number]]  Attending Physician: Marcelyn Bruins [[id number]]  Certification:: I certify this patient will need inpatient services for at least 2 midnights  Estimated Length of Stay: 5          B Medical/Surgery History Past Medical History:  Diagnosis Date   Anxiety    Coronary artery disease    Depression    Essential tremor    GERD (gastroesophageal reflux disease)     High cholesterol    Hypertension    Myocardial infarct ([place])    Orthostatic hypotension    Stroke [facility])    Stroke ([place]) 02/02/2021   Vascular dementia ([place])    Past Surgical History:  Procedure Laterality Date   APPENDECTOMY     CARDIAC SURGERY     CHOLECYSTECTOMY     FLEXIBLE BRONCHOSCOPY N/A 10/06/2021   Procedure: FLEXIBLE BRONCHOSCOPY;  Surgeon: Margaretha Seeds, MD;  Location: [place];  Service: Cardiopulmonary;  Laterality: N/A;   HERNIA REPAIR     NASAL SINUS SURGERY     SHOULDER SURGERY       A IV Location/Drains/Wounds Patient Lines/Drains/Airways Status     Active Line/Drains/Airways     Name Placement date Placement time Site Days   Peripheral IV 11/26/2021 20 G 2.5" Anterior;Right Forearm 11/19/2021  --  Forearm  1            Intake/Output Last 24 hours  Intake/Output Summary (Last 24 hours) at 11/16/2021 1354 Last data filed at 11/16/2021 0549 Gross per 24 hour  Intake 2500 ml  Output --  Net 2500 ml    Labs/Imaging Results for orders placed or performed during the hospital encounter of 11/08/2021 (from the past 48 hour(s))  Comprehensive metabolic panel     Status: Abnormal   Collection Time: 11/21/2021  3:19 PM  Result Value Ref Range   Sodium 135 135 - 145 mmol/L   Potassium 4.3 3.5 - 5.1 mmol/L   Chloride 98 98 - 111 mmol/L   CO2 22 22 -  32 mmol/L   Glucose, Bld 201 (H) 70 - 99 mg/dL    Comment: Glucose reference range applies only to samples taken after fasting for at least 8 hours.   BUN 18 8 - 23 mg/dL   Creatinine, Ser 1.53 (H) 0.61 - 1.24 mg/dL   Calcium 9.1 8.9 - 10.3 mg/dL   Total Protein 7.1 6.5 - 8.1 g/dL   Albumin 2.9 (L) 3.5 - 5.0 g/dL   AST 45 (H) 15 - 41 U/L   ALT 45 (H) 0 - 44 U/L   Alkaline Phosphatase 56 38 - 126 U/L   Total Bilirubin 0.7 0.3 - 1.2 mg/dL   GFR, Estimated 49 (L) >60 mL/min    Comment: (NOTE) Calculated using the CKD-EPI Creatinine Equation (2021)    Anion gap 15 5 - 15    Comment: Performed at [facility] [address], [place], [place] [address]  CBC with Differential     Status: Abnormal   Collection Time: 11/02/2021  3:19 PM  Result Value Ref Range   WBC 20.0 (H) 4.0 - 10.5 K/uL   RBC 3.86 (L) 4.22 - 5.81 MIL/uL   Hemoglobin 12.0 (L) 13.0 - 17.0 g/dL   HCT 39.4 39.0 - 52.0 %   MCV 102.1 (H) 80.0 - 100.0 fL   MCH 31.1 26.0 - 34.0 pg   MCHC 30.5 30.0 - 36.0 g/dL   RDW 16.3 (H) 11.5 - 15.5 %   Platelets 198 150 - 400 K/uL   nRBC 0.0 0.0 - 0.2 %   Neutrophils Relative % 90 %   Neutro Abs 17.9 (H) 1.7 - 7.7 K/uL   Lymphocytes Relative 3 %   Lymphs Abs 0.7 0.7 - 4.0 K/uL   Monocytes Relative 6 %   Monocytes Absolute 1.2 (H) 0.1 - 1.0 K/uL   Eosinophils Relative 0 %   Eosinophils Absolute 0.0 0.0 - 0.5 K/uL   Basophils Relative 0 %   Basophils Absolute 0.0 0.0 - 0.1 K/uL   Immature Granulocytes 1 %   Abs Immature Granulocytes 0.15 (H) 0.00 - 0.07 K/uL    Comment: Performed at [facility] [address], [place], [place] [address]  Protime-INR     Status: None   Collection Time: 11/10/2021  3:19 PM  Result Value Ref Range   Prothrombin Time 13.3 11.4 - 15.2 seconds   INR 1.0 0.8 - 1.2    Comment: (NOTE) INR goal varies based on device and disease states. Performed at [facility], [place] [address], [place], [place] [address]   APTT     Status: None   Collection Time: 11/17/2021  3:19 PM  Result Value Ref Range   aPTT 24 24 - 36 seconds    Comment: Performed at [facility] [address], [place], [place] [address]  Troponin I (High Sensitivity)     Status: Abnormal   Collection Time: 11/02/2021  3:19 PM  Result Value Ref Range   Troponin I (High Sensitivity) 38 (H) <18 ng/L    Comment: (NOTE) Elevated high sensitivity troponin I (hsTnI) values and significant  changes across serial measurements may suggest ACS but many other  chronic and acute conditions are known to elevate hsTnI results.  Refer to the "Links" section for chest pain algorithms and  additional  guidance. Performed at [facility], [place] [address], [place], [place] [address]   D-dimer, quantitative     Status: Abnormal   Collection  Time: 11/19/2021  3:39 PM  Result Value Ref Range   D-Dimer, Quant 2.05 (H) 0.00 - 0.50 ug/mL-FEU    Comment: (NOTE) At the manufacturer cut-off value of 0.5 g/mL FEU, this assay has a negative predictive value of 95-100%.This assay is intended for use in conjunction with a clinical pretest probability (PTP) assessment model to exclude pulmonary embolism (PE) and deep venous thrombosis (DVT) in outpatients suspected of PE or DVT. Results should be correlated with clinical presentation. Performed at [facility], [place] [address] [place], [place] [address]   Lactic acid, plasma     Status: Abnormal   Collection Time: 11/09/2021  3:54 PM  Result Value Ref Range   Lactic Acid, Venous 2.9 (HH) 0.5 - 1.9 mmol/L    Comment: CRITICAL RESULT CALLED TO, READ BACK BY AND VERIFIED WITH G.TATE,RN 11/18/2021 AT 1656 AHUGHES Performed at [facility], [place] [address] [place], [place] [address]   Brain natriuretic peptide     Status: Abnormal   Collection Time: 11/07/2021  3:54 PM  Result Value Ref Range   B Natriuretic Peptide 299.6 (H) 0.0 - 100.0 pg/mL    Comment: Performed at [facility] [address] [place], [place] [address]  MRSA Next Gen by PCR, Nasal     Status: None   Collection Time: 11/06/2021  4:27 PM   Specimen: Nasal Mucosa; Nasal Swab  Result Value Ref Range   MRSA by PCR Next Gen NOT DETECTED NOT DETECTED    Comment: (NOTE) The GeneXpert MRSA Assay (FDA approved for NASAL specimens only), is one component of a comprehensive MRSA colonization surveillance program. It is not intended to diagnose MRSA infection nor to guide or monitor treatment for MRSA infections. Test performance is not FDA approved in patients less than 79 years old. Performed at [facility], [place] [address], [place],  [place] [address]   Lactic acid, plasma     Status: Abnormal   Collection Time: 11/21/2021  5:37 PM  Result Value Ref Range   Lactic Acid, Venous 2.5 (HH) 0.5 - 1.9 mmol/L    Comment: CRITICAL VALUE NOTED. VALUE IS CONSISTENT WITH PREVIOUSLY REPORTED/CALLED VALUE Performed at [facility], [place] [address], [place], [place] [address]   Troponin I (High Sensitivity)     Status: Abnormal   Collection Time: 11/29/2021  5:37 PM  Result Value Ref Range   Troponin I (High Sensitivity) 27 (H) <18 ng/L    Comment: (NOTE) Elevated high sensitivity troponin I (hsTnI) values and significant  changes across serial measurements may suggest ACS but many other  chronic and acute conditions are known to elevate hsTnI results.  Refer to the "Links" section for chest pain algorithms and additional  guidance. Performed at [facility], [place] [address], [place], [place] [address]   Procalcitonin - Baseline     Status: None   Collection Time: 11/01/2021  5:54 PM  Result Value Ref Range   Procalcitonin 0.52 ng/mL    Comment:        Interpretation: PCT > 0.5 ng/mL and <= 2 ng/mL: Systemic infection (sepsis) is possible, but other conditions are known to elevate PCT as well. (NOTE)       Sepsis PCT Algorithm           Lower Respiratory Tract  Infection PCT Algorithm    ----------------------------     ----------------------------         PCT < 0.25 ng/mL                PCT < 0.10 ng/mL          Strongly encourage             Strongly discourage   discontinuation of antibiotics    initiation of antibiotics    ----------------------------     -----------------------------       PCT 0.25 - 0.50 ng/mL            PCT 0.10 - 0.25 ng/mL               OR       >80% decrease in PCT            Discourage initiation of                                            antibiotics      Encourage discontinuation           of antibiotics    ----------------------------      -----------------------------         PCT >= 0.50 ng/mL              PCT 0.26 - 0.50 ng/mL                AND       <80% decrease in PCT             Encourage initiation of                                             antibiotics       Encourage continuation           of antibiotics    ----------------------------     -----------------------------        PCT >= 0.50 ng/mL                  PCT > 0.50 ng/mL               AND         increase in PCT                  Strongly encourage                                      initiation of antibiotics    Strongly encourage escalation           of antibiotics                                     -----------------------------                                           PCT <= 0.25 ng/mL  OR                                        > 80% decrease in PCT                                      Discontinue / Do not initiate                                             antibiotics  Performed at [facility], [place] [address] [place], [place] [address]   SARS Coronavirus 2 by RT PCR (hospital order, performed in [facility] hospital lab) *cepheid single result test* Anterior Nasal Swab     Status: None   Collection Time: 11/09/2021  7:42 PM   Specimen: Anterior Nasal Swab  Result Value Ref Range   SARS Coronavirus 2 by RT PCR NEGATIVE NEGATIVE    Comment: (NOTE) SARS-CoV-2 target nucleic acids are NOT DETECTED.  The SARS-CoV-2 RNA is generally detectable in upper and lower respiratory specimens during the acute phase of infection. The lowest concentration of SARS-CoV-2 viral copies this assay can detect is 250 copies / mL. A negative result does not preclude SARS-CoV-2 infection and should not be used as the sole basis for treatment or other patient management decisions.  A negative result may occur with improper specimen collection / handling, submission of specimen other than nasopharyngeal swab,  presence of viral mutation(s) within the areas targeted by this assay, and inadequate number of viral copies (<250 copies / mL). A negative result must be combined with clinical observations, patient history, and epidemiological information.  Fact Sheet for Patients:   https://www.patel.info/  Fact Sheet for Healthcare Providers: https://hall.com/  This test is not yet approved or  cleared by the [place] FDA and has been authorized for detection and/or diagnosis of SARS-CoV-2 by FDA under an Emergency Use Authorization (EUA).  This EUA will remain in effect (meaning this test can be used) for the duration of the COVID-19 declaration under Section 564(b)(1) of the Act, 21 U.S.C. section 360bbb-3(b)(1), unless the authorization is terminated or revoked sooner.  Performed at [facility], [place] [address] [place], [place] [address]   Comprehensive metabolic panel     Status: Abnormal   Collection Time: 11/16/21  4:13 AM  Result Value Ref Range   Sodium 137 135 - 145 mmol/L   Potassium 3.8 3.5 - 5.1 mmol/L   Chloride 103 98 - 111 mmol/L   CO2 25 22 - 32 mmol/L   Glucose, Bld 102 (H) 70 - 99 mg/dL    Comment: Glucose reference range applies only to samples taken after fasting for at least 8 hours.   BUN 17 8 - 23 mg/dL   Creatinine, Ser 1.12 0.61 - 1.24 mg/dL   Calcium 8.6 (L) 8.9 - 10.3 mg/dL   Total Protein 5.7 (L) 6.5 - 8.1 g/dL   Albumin 2.3 (L) 3.5 - 5.0 g/dL   AST 23 15 - 41 U/L   ALT 31 0 - 44 U/L   Alkaline Phosphatase 43 38 - 126 U/L   Total Bilirubin 0.5 0.3 - 1.2 mg/dL   GFR, Estimated >60 >60  mL/min    Comment: (NOTE) Calculated using the CKD-EPI Creatinine Equation (2021)    Anion gap 9 5 - 15    Comment: Performed at [facility], [place] [address], [place], [place] [address]  CBC     Status: Abnormal   Collection Time: 11/16/21  4:13 AM  Result Value Ref Range   WBC 11.3 (H) 4.0 - 10.5 K/uL   RBC 3.15  (L) 4.22 - 5.81 MIL/uL   Hemoglobin 9.8 (L) 13.0 - 17.0 g/dL   HCT 31.5 (L) 39.0 - 52.0 %   MCV 100.0 80.0 - 100.0 fL   MCH 31.1 26.0 - 34.0 pg   MCHC 31.1 30.0 - 36.0 g/dL   RDW 16.3 (H) 11.5 - 15.5 %   Platelets 120 (L) 150 - 400 K/uL   nRBC 0.0 0.0 - 0.2 %    Comment: Performed at [facility] [address] [place],  [address]  Procalcitonin     Status: None   Collection Time: 11/16/21  4:13 AM  Result Value Ref Range   Procalcitonin 0.48 ng/mL    Comment:        Interpretation: PCT (Procalcitonin) <= 0.5 ng/mL: Systemic infection (sepsis) is not likely. Local bacterial infection is possible. (NOTE)       Sepsis PCT Algorithm           Lower Respiratory Tract                                      Infection PCT Algorithm    ----------------------------     ----------------------------         PCT < 0.25 ng/mL                PCT < 0.10 ng/mL          Strongly encourage             Strongly discourage   discontinuation of antibiotics    initiation of antibiotics    ----------------------------     -----------------------------       PCT 0.25 - 0.50 ng/mL            PCT 0.10 - 0.25 ng/mL               OR       >80% decrease in PCT            Discourage initiation of                                            antibiotics      Encourage discontinuation           of antibiotics    ----------------------------     -----------------------------         PCT >= 0.50 ng/mL              PCT 0.26 - 0.50 ng/mL               AND        <80% decrease in PCT             Encourage initiation of  antibiotics       Encourage continuation           of antibiotics    ----------------------------     -----------------------------        PCT >= 0.50 ng/mL                  PCT > 0.50 ng/mL               AND         increase in PCT                  Strongly encourage                                      initiation of antibiotics     Strongly encourage escalation           of antibiotics                                     -----------------------------                                           PCT <= 0.25 ng/mL                                                 OR                                        > 80% decrease in PCT                                      Discontinue / Do not initiate                                             antibiotics  Performed at [facility], [address], [place], [place] [address]    CT Chest Wo Contrast  Result Date: 11/28/2021 CLINICAL DATA:  Pneumonia, complication suspected, xray done Chest pain and shortness of breath. EXAM: CT CHEST WITHOUT CONTRAST TECHNIQUE: Multidetector CT imaging of the chest was performed following the standard protocol without IV contrast. RADIATION DOSE REDUCTION: This exam was performed according to the departmental dose-optimization program which includes automated exposure control, adjustment of the mA and/or kV according to patient size and/or use of iterative reconstruction technique. COMPARISON:  Radiograph earlier today.  Chest CT 10/08/2021 FINDINGS: Cardiovascular: Stable cardiomegaly. Aortic atherosclerosis and tortuosity. No aortic aneurysm. Left atrial septal occlusion device. Atrial septal occlusion device. Pacemaker wires with tip in the right ventricle and right atrial appendage. No pericardial effusion. Mediastinum/Nodes: Increasing mediastinal adenopathy, right paratracheal node measures 16 mm, series 3, image 50, previously 10 mm. Additional prominent and mildly enlarged mediastinal lymph nodes are similar or slightly increased in size. Garvin  mm subcarinal node, series 3, image 68, unchanged. Limited assessment for hilar adenopathy on this unenhanced exam. No visualized thyroid nodule. Wall thickening of the distal esophagus with similar small to moderate hiatal hernia. Lungs/Pleura: Shifting pulmonary opacities from prior exam. The left lower lobe  airspace disease has significantly improved with residual areas of ground-glass. Additional mild areas of ground-glass involving the left upper lobe. There is worsening patchy and ground-glass opacity throughout the right lung with new consolidative components in the right upper lobe. Central air bronchograms within the consolidated components. Subpleural reticulation and interstitial thickening in the basilar right middle and lower lobes. Decreased pleural effusions with only trace residual, more so on the right. No pneumothorax. The trachea and central bronchi are unremarkable. Upper Abdomen: No acute upper abdominal findings. Stable exophytic cyst from the upper right kidney, needing no further follow-up. Renal renal lesions which were fully included on abdominal CT 08/26/2021. Nonobstructing upper pole right renal stone. Musculoskeletal: Chronic L1 compression fracture when compared with 08/26/2021 abdominal CT. There are no acute or suspicious osseous abnormalities. IMPRESSION: 1. Shifting pulmonary opacities from CT last month. Worsening patchy and ground-glass opacity throughout the right lung with new consolidative components in the right upper lobe. There is overall improvement in the left lung opacities from prior. Differential consideration is broad and includes both infectious and noninfectious etiologies. Cryptogenic organizing pneumonia, or other inflammatory conditions are considered. 2. Decreased pleural effusions with only trace residual, more so on the right. 3. Increasing mediastinal adenopathy, likely reactive. 4. Stable cardiomegaly. 5. Distal esophageal wall thickening with small to moderate hiatal hernia, can be seen with reflux or esophagitis. Aortic Atherosclerosis (ICD10-I70.0). Electronically Signed   By: Keith Rake M.D.   On: 11/09/2021 23:47   DG Chest Port 1 View  Result Date: 11/09/2021 CLINICAL DATA:  Sepsis presentation EXAM: PORTABLE CHEST 1 VIEW COMPARISON:  11/12/2021  FINDINGS: Cardiomegaly. Aortic atherosclerosis. Dual lead pacemaker with leads in the region of the right atrium and right ventricle. Chronic lung markings at the left lung base. On the right, patchy infiltrates previously seen have worsened, particularly in the right mid lung, consistent with bronchopneumonia. IMPRESSION: Worsening patchy infiltrates in the right lung consistent with bronchopneumonia. Electronically Signed   By: Nelson Chimes M.D.   On: 11/22/2021 15:47    Pending Labs Unresulted Labs (From admission, onward)     Start     Ordered   11/22/21 0500  Creatinine, serum  (enoxaparin (LOVENOX)    CrCl >/= 30 ml/min)  Weekly,   R     Comments: while on enoxaparin therapy    11/01/2021 1906   11/16/21 0500  Procalcitonin  Daily at 5am,   R      11/21/2021 1941   11/17/2021 1901  Expectorated Sputum Assessment w Gram Stain, Rflx to Resp Cult  (COPD / Pneumonia / Cellulitis / Lower Extremity Wound)  Once,   R        11/05/2021 1906   11/20/2021 1519  Blood Culture (routine x 2)  (Undifferentiated presentation (screening labs and basic nursing orders))  BLOOD CULTURE X 2,   STAT      11/13/2021 1519   11/10/2021 1519  Urinalysis, Routine w reflex microscopic  (Undifferentiated presentation (screening labs and basic nursing orders))  ONCE - URGENT,   URGENT        11/18/2021 1519   11/21/2021 1519  Urine Culture  (Undifferentiated presentation (screening labs and basic nursing orders))  ONCE - URGENT,   URGENT  Question:  Indication  Answer:  Sepsis   11/01/2021 1519            Vitals/Pain Today's Vitals   11/16/21 1030 11/16/21 1115 11/16/21 1142 11/16/21 1142  BP: 129/77 130/82    Pulse: 96 92    Resp: (!) 22 20    Temp:   98.3 F (36.8 C)   TempSrc:      SpO2: 92% 92%    Weight:      Height:      PainSc:    6     Isolation Precautions Airborne and Contact precautions  Medications Medications  midodrine (PROAMATINE) tablet 2.5 mg (2.5 mg Oral Given 11/16/21 1220)  vancomycin  (VANCOREADY) IVPB 1250 mg/250 mL (has no administration in time range)  allopurinol (ZYLOPRIM) tablet 100 mg (100 mg Oral Given 11/16/21 0857)  aspirin EC tablet 81 mg (81 mg Oral Given 11/16/21 0855)  atorvastatin (LIPITOR) tablet 80 mg (has no administration in time range)  metoprolol tartrate (LOPRESSOR) tablet 37.5 mg (37.5 mg Oral Given 11/16/21 0856)  ranolazine (RANEXA) 12 hr tablet 1,000 mg (1,000 mg Oral Given 11/16/21 0854)  escitalopram (LEXAPRO) tablet 20 mg (20 mg Oral Given 11/16/21 0857)  memantine (NAMENDA) tablet 10 mg (10 mg Oral Given 11/16/21 0857)  QUEtiapine (SEROQUEL) tablet 100 mg (100 mg Oral Given 11/10/2021 2124)  pantoprazole (PROTONIX) EC tablet 40 mg (40 mg Oral Given 11/09/2021 1944)  clopidogrel (PLAVIX) tablet 75 mg (75 mg Oral Given 11/16/21 0856)  rOPINIRole (REQUIP XL) 24 hr tablet 4 mg (0 mg Oral Hold 11/08/2021 2351)  primidone (MYSOLINE) tablet 100 mg (100 mg Oral Given 11/16/21 0854)  ipratropium-albuterol (DUONEB) 0.5-2.5 (3) MG/3ML nebulizer solution 3 mL (3 mLs Nebulization Given 11/16/21 0850)  albuterol (PROVENTIL) (2.5 MG/3ML) 0.083% nebulizer solution 2.5 mg (has no administration in time range)  latanoprost (XALATAN) 0.005 % ophthalmic solution 1 drop (0 drops Both Eyes Hold 11/09/2021 2350)  enoxaparin (LOVENOX) injection 40 mg (40 mg Subcutaneous Given 10/30/2021 2125)  sodium chloride flush (NS) 0.9 % injection 3 mL (3 mLs Intravenous Given 11/16/21 0905)  acetaminophen (TYLENOL) tablet 650 mg (650 mg Oral Given 11/16/21 1220)    Or  acetaminophen (TYLENOL) suppository 650 mg ( Rectal See Alternative 11/16/21 1220)  polyethylene glycol (MIRALAX / GLYCOLAX) packet 17 g (has no administration in time range)  ceFEPIme (MAXIPIME) 2 g in sodium chloride 0.9 % 100 mL IVPB (0 g Intravenous Stopped 11/16/21 0937)  oxyCODONE-acetaminophen (PERCOCET/ROXICET) 5-325 MG per tablet 0.5 tablet (0.5 tablets Oral Given 11/16/21 0621)  LORazepam (ATIVAN) tablet 0.5 mg (0.5 mg Oral Given  11/14/2021 2349)  mometasone-formoterol (DULERA) 200-5 MCG/ACT inhaler 2 puff (2 puffs Inhalation Given 11/16/21 0853)  methylPREDNISolone sodium succinate (SOLU-MEDROL) 40 mg/mL injection 40 mg (40 mg Intravenous Given 11/16/21 1221)  fluconazole (DIFLUCAN) IVPB 100 mg (100 mg Intravenous New Bag/Given 11/16/21 1235)  saccharomyces boulardii (FLORASTOR) capsule 250 mg (250 mg Oral Given 11/16/21 1302)  lactated ringers bolus 1,000 mL (0 mLs Intravenous Stopped 11/13/2021 1917)  ceFEPIme (MAXIPIME) 2 g in sodium chloride 0.9 % 100 mL IVPB (0 g Intravenous Stopped 11/21/2021 1850)  vancomycin (VANCOREADY) IVPB 2000 mg/400 mL (0 mg Intravenous Stopped 11/20/2021 2029)  lactated ringers bolus 1,000 mL (0 mLs Intravenous Stopped 11/16/21 0549)  oxyCODONE-acetaminophen (PERCOCET/ROXICET) 5-325 MG per tablet 1 tablet (1 tablet Oral Given 11/13/2021 1747)    Mobility walks with device High fall risk   Focused Assessments     R Recommendations: See Admitting Provider Note  Report given to:   Additional Notes:

## 2021-11-16 NOTE — ED Notes (Signed)
Wife at bedside

## 2021-11-17 ENCOUNTER — Encounter (HOSPITAL_COMMUNITY): Payer: Self-pay | Admitting: Internal Medicine

## 2021-11-17 ENCOUNTER — Inpatient Hospital Stay (HOSPITAL_COMMUNITY): Payer: Medicare Other

## 2021-11-17 DIAGNOSIS — K219 Gastro-esophageal reflux disease without esophagitis: Secondary | ICD-10-CM | POA: Diagnosis not present

## 2021-11-17 DIAGNOSIS — N179 Acute kidney failure, unspecified: Secondary | ICD-10-CM | POA: Diagnosis not present

## 2021-11-17 DIAGNOSIS — R1319 Other dysphagia: Secondary | ICD-10-CM

## 2021-11-17 DIAGNOSIS — I2510 Atherosclerotic heart disease of native coronary artery without angina pectoris: Secondary | ICD-10-CM | POA: Diagnosis not present

## 2021-11-17 DIAGNOSIS — J189 Pneumonia, unspecified organism: Secondary | ICD-10-CM | POA: Diagnosis not present

## 2021-11-17 DIAGNOSIS — R933 Abnormal findings on diagnostic imaging of other parts of digestive tract: Secondary | ICD-10-CM | POA: Diagnosis not present

## 2021-11-17 DIAGNOSIS — J849 Interstitial pulmonary disease, unspecified: Secondary | ICD-10-CM | POA: Diagnosis not present

## 2021-11-17 LAB — BASIC METABOLIC PANEL
Anion gap: 10 (ref 5–15)
BUN: 20 mg/dL (ref 8–23)
CO2: 24 mmol/L (ref 22–32)
Calcium: 8.8 mg/dL — ABNORMAL LOW (ref 8.9–10.3)
Chloride: 104 mmol/L (ref 98–111)
Creatinine, Ser: 1.03 mg/dL (ref 0.61–1.24)
GFR, Estimated: >60 mL/min (ref >60)
Glucose, Bld: 147 mg/dL — ABNORMAL HIGH (ref 70–99)
Potassium: 4.5 mmol/L (ref 3.5–5.1)
Sodium: 138 mmol/L (ref 135–145)

## 2021-11-17 LAB — CBC
HCT: 30.3 % — ABNORMAL LOW (ref 39.0–52.0)
Hemoglobin: 9.8 g/dL — ABNORMAL LOW (ref 13.0–17.0)
MCH: 32 pg (ref 26.0–34.0)
MCHC: 32.3 g/dL (ref 30.0–36.0)
MCV: 99 fL (ref 80.0–100.0)
Platelets: 146 10*3/uL — ABNORMAL LOW (ref 150–400)
RBC: 3.06 MIL/uL — ABNORMAL LOW (ref 4.22–5.81)
RDW: 15.9 % — ABNORMAL HIGH (ref 11.5–15.5)
WBC: 11.4 10*3/uL — ABNORMAL HIGH (ref 4.0–10.5)
nRBC: 0 % (ref 0.0–0.2)

## 2021-11-17 LAB — PROCALCITONIN: Procalcitonin: 0.16 ng/mL

## 2021-11-17 MED ORDER — SODIUM CHLORIDE 0.9 % IV SOLN
3.0000 g | Freq: Four times a day (QID) | INTRAVENOUS | Status: DC
  Administered 2021-11-17 – 2021-11-24 (×27): 3 g via INTRAVENOUS
  Filled 2021-11-17 (×28): qty 8

## 2021-11-17 MED ORDER — HYDROCOD POLI-CHLORPHE POLI ER 10-8 MG/5ML PO SUER
5.0000 mL | Freq: Once | ORAL | Status: AC
  Administered 2021-11-17: 5 mL via ORAL

## 2021-11-17 MED ORDER — OXYCODONE-ACETAMINOPHEN 5-325 MG PO TABS
1.0000 | ORAL_TABLET | Freq: Three times a day (TID) | ORAL | Status: DC
  Administered 2021-11-17 (×2): 1 via ORAL
  Administered 2021-11-18: 2 via ORAL
  Administered 2021-11-18: 1 via ORAL
  Administered 2021-11-18 – 2021-11-19 (×2): 2 via ORAL
  Filled 2021-11-17: qty 2
  Filled 2021-11-17: qty 1
  Filled 2021-11-17 (×2): qty 2
  Filled 2021-11-17 (×2): qty 1

## 2021-11-17 NOTE — Progress Notes (Signed)
PROGRESS NOTE        PATIENT DETAILS Name: Mark Thomas Age: 71 y.o. Sex: male Date of Birth: June 27, 1950 Admit Date: 11/06/2021 Admitting Physician Marcelyn Bruins, MD SPQ:[id number], Einar Pheasant, DO  Brief Summary: Patient is a 71 y.o.  male with history of atrial fibrillation-s/p watchman's device, CAD s/p PCI, CKD stage IIIa, chronic HFpEF, orthostatic hypotension, dementia-who has had numerous hospitalizations for recurrent pneumonia-presented to the ED on 7/17 with worsening shortness of breath and cough-found to have recurrent PNA and admitted to the hospitalist service.   Significant events: 4/05-4/10>> hospitalization for PNA 4/27-4/30>> hospitalization for sepsis/hypoxia due to PNA. 6/04 6/15>> hospitalization for severe hypoxia requiring HFNC for PNA.  Discharged on long tapering steroids. 6/20-6/27>> hospitalization for hypoxia due to recurrent PNA 7/17>> admit to [facility]-recurrent PNA  Significant studies: 7/17>> CXR: Worsening patchy right-sided opacities 7/17>> CT chest: Worsening patchy opacities in the right lung-overall improvement in left lung opacities.  Significant microbiology data: 6/07>> BAL culture: Negative  6/07>> BAL AFB smear: Negative 6/07>> BAL AFB culture: Pending 6/07>> BAL fungal culture: Negative 6/07>> BAL cytology: No malignant cells identified 7/17>> COVID PCR: Negative 7/17>> blood culture: Negative  Procedures: 6/07>> bronchoscopy  Consults: PCCM  Subjective: Some cough but otherwise appears comfortable.  Objective: Vitals: Blood pressure 120/68, pulse 79, temperature 98.7 F (37.1 C), temperature source Oral, resp. rate 15, height _0  (1.854 m), weight 108.2 kg, SpO2 99 %.   Exam: Gen Exam:Alert awake-not in any distress HEENT:atraumatic, normocephalic Chest: B/L clear to auscultation anteriorly CVS:S1S2 regular Abdomen:soft non tender, non distended Extremities:no edema Neurology: Non focal Skin: no  rash  Pertinent Labs/Radiology:    Latest Ref Rng & Units 11/17/2021    4:41 AM 11/16/2021    4:13 AM 11/13/2021    3:19 PM  CBC  WBC 4.0 - 10.5 K/uL 11.4  11.3  20.0   Hemoglobin 13.0 - 17.0 g/dL 9.8  9.8  12.0   Hematocrit 39.0 - 52.0 % 30.3  31.5  39.4   Platelets 150 - 400 K/uL 146  120  198     Lab Results  Component Value Date   NA 138 11/17/2021   K 4.5 11/17/2021   CL 104 11/17/2021   CO2 24 11/17/2021      Assessment/Plan: Recurrent pneumonia with acute hypoxic respiratory failure: Has been on O2 since his most recent hospitalization-stable on 3 L of oxygen-had a modified barium swallow today where barium tablet did not pass into the stomach-raising some suspicion that etiology for recurrent pneumonia may be aspiration.  Modified barium swallow ordered-have also consulted gastroenterology.  Continue antibiotics but will stop vancomycin/cefepime and transition to Unasyn-remains on steroids and PPI.   AKI on CKD stage IIIa: AKI hemodynamically mediated-back to baseline.    History of interstitial lung disease: Suspected GERD contributing to his ILD.  Supportive care-on PPI.   Chronic HFpEF: Volume status stable-do not think he requires any further diuretics at this point.     History SVT: Continue beta-blocker-evaluated by cardiology in his prior admits-not felt to have A-fib-continue beta-blocker.     History of PAF-s/p watchman's device 03/2019 and RFA with isolation of all 4 pulmonary veins in 2021: Maintaining sinus rhythm-Per spouse-they have been notified by outpatient rhythm management group that he has been having atrial fibrillation lately.  On beta-blocker-have consulted cardiology/EP.  Since he has a  watchman's device in place-no longer on anticoagulation.     History of CAD s/p multiple PCI's: Currently without any anginal symptoms.  Continue aspirin/Plavix/statin/beta-blocker.   History of ASD-s/p closure 01/22/2020   History of moderate aortic stenosis: Stable  for outpatient follow-up with cardiology.  History of CVA: No focal deficits.  Continue antiplatelets-statin.   History of sick sinus syndrome-s/p PPM placement: Continue telemetry monitoring.   History of vascular dementia: Appears to be mild-continue Namenda//Seroquel.    History of anxiety/depression: Continue Lexapro  History of RLS: Continue Requip   History of orthostatic hypotension: Continue midodrine.     GERD: Continue PPI  Chronic pain syndrome: Continue home regimen of narcotics.  Obesity: Estimated body mass index is 31.47 kg/m as calculated from the following:   Height as of this encounter: _0  (1.854 m).   Weight as of this encounter: 108.2 kg.   Code status:   Code Status: Full Code   DVT Prophylaxis: enoxaparin (LOVENOX) injection 40 mg Start: 11/18/2021 2200   Family Communication: Spouse Annette-[phone] updated on 7/19  Disposition Plan: Status is: Inpatient Remains inpatient appropriate because: Recurrent PNA-hypoxia-noted stable for discharge.  Work-up underway   Planned Discharge Destination:Home   Diet: Diet Order             Diet NPO time specified  Diet effective now                     Antimicrobial agents: Anti-infectives (From admission, onward)    Start     Dose/Rate Route Frequency Ordered Stop   11/16/21 2000  vancomycin (VANCOREADY) IVPB 1250 mg/250 mL        1,250 mg 166.7 mL/hr over 90 Minutes Intravenous Every 24 hours 11/20/2021 1842     11/16/21 1230  fluconazole (DIFLUCAN) IVPB 100 mg        100 mg 50 mL/hr over 60 Minutes Intravenous Every 24 hours 11/16/21 1111     11/16/21 1000  ceFEPIme (MAXIPIME) 2 g in sodium chloride 0.9 % 100 mL IVPB        2 g 200 mL/hr over 30 Minutes Intravenous Every 12 hours 11/22/2021 1912     11/28/2021 1630  vancomycin (VANCOREADY) IVPB 2000 mg/400 mL        2,000 mg 200 mL/hr over 120 Minutes Intravenous  Once 11/25/2021 1627 11/08/2021 2029   11/09/2021 1615  ceFEPIme (MAXIPIME) 2 g  in sodium chloride 0.9 % 100 mL IVPB        2 g 200 mL/hr over 30 Minutes Intravenous  Once 11/06/2021 1609 11/02/2021 1850        MEDICATIONS: Scheduled Meds:  allopurinol  100 mg Oral Daily   aspirin EC  81 mg Oral Daily   atorvastatin  80 mg Oral Daily   clopidogrel  75 mg Oral Daily   enoxaparin (LOVENOX) injection  40 mg Subcutaneous Q24H   escitalopram  20 mg Oral Daily   ipratropium-albuterol  3 mL Nebulization BID   latanoprost  1 drop Both Eyes QHS   memantine  10 mg Oral BID   methylPREDNISolone (SOLU-MEDROL) injection  40 mg Intravenous Daily   metoprolol tartrate  37.5 mg Oral BID   midodrine  2.5 mg Oral TID WC   mometasone-formoterol  2 puff Inhalation BID   oxyCODONE-acetaminophen  1-2 tablet Oral Q8H   pantoprazole  40 mg Oral QPM   primidone  100 mg Oral BID   QUEtiapine  100 mg Oral QHS   ranolazine  1,000 mg Oral BID   rOPINIRole  4 mg Oral QHS   saccharomyces boulardii  250 mg Oral BID   sodium chloride flush  3 mL Intravenous Q12H   Continuous Infusions:  ceFEPime (MAXIPIME) IV 2 g (11/17/21 1115)   fluconazole (DIFLUCAN) IV Stopped (11/16/21 1444)   vancomycin 1,250 mg (11/16/21 2034)   PRN Meds:.acetaminophen **OR** acetaminophen, albuterol, LORazepam, polyethylene glycol   I have personally reviewed following labs and imaging studies  LABORATORY DATA: CBC: Recent Labs  Lab 11/13/2021 1519 11/16/21 0413 11/17/21 0441  WBC 20.0* 11.3* 11.4*  NEUTROABS 17.9*  --   --   HGB 12.0* 9.8* 9.8*  HCT 39.4 31.5* 30.3*  MCV 102.1* 100.0 99.0  PLT 198 120* 146*    Basic Metabolic Panel: Recent Labs  Lab 10/30/2021 1519 11/16/21 0413 11/17/21 0441  NA 135 137 138  K 4.3 3.8 4.5  CL 98 103 104  CO2 _0 GLUCOSE 201* 102* 147*  BUN _1 CREATININE 1.53* 1.12 1.03  CALCIUM 9.1 8.6* 8.8*    GFR: Estimated Creatinine Clearance: 88.5 mL/min (by C-G formula based on SCr of 1.03 mg/dL).  Liver Function Tests: Recent Labs  Lab  11/25/2021 1519 11/16/21 0413  AST 45* 23  ALT 45* 31  ALKPHOS 56 43  BILITOT 0.7 0.5  PROT 7.1 5.7*  ALBUMIN 2.9* 2.3*   No results for input(s): "LIPASE", "AMYLASE" in the last 168 hours. No results for input(s): "AMMONIA" in the last 168 hours.  Coagulation Profile: Recent Labs  Lab 11/21/2021 1519  INR 1.0    Cardiac Enzymes: No results for input(s): "CKTOTAL", "CKMB", "CKMBINDEX", "TROPONINI" in the last 168 hours.  BNP (last 3 results) Recent Labs    03/18/21 1004  PROBNP 370    Lipid Profile: No results for input(s): "CHOL", "HDL", "LDLCALC", "TRIG", "CHOLHDL", "LDLDIRECT" in the last 72 hours.  Thyroid Function Tests: No results for input(s): "TSH", "T4TOTAL", "FREET4", "T3FREE", "THYROIDAB" in the last 72 hours.  Anemia Panel: No results for input(s): "VITAMINB12", "FOLATE", "FERRITIN", "TIBC", "IRON", "RETICCTPCT" in the last 72 hours.  Urine analysis:    Component Value Date/Time   COLORURINE AMBER (A) 10/07/2021 2003   APPEARANCEUR CLEAR 10/07/2021 2003   LABSPEC 1.021 10/07/2021 2003   PHURINE 5.0 10/07/2021 2003   GLUCOSEU NEGATIVE 10/07/2021 2003   HGBUR NEGATIVE 10/07/2021 2003   [place] NEGATIVE 10/07/2021 2003   [place] NEGATIVE 10/07/2021 2003   PROTEINUR 30 (A) 10/07/2021 2003   NITRITE NEGATIVE 10/07/2021 2003   LEUKOCYTESUR NEGATIVE 10/07/2021 2003    Sepsis Labs: Lactic Acid, Venous    Component Value Date/Time   LATICACIDVEN 2.5 (HH) 11/20/2021 1737    MICROBIOLOGY: Recent Results (from the past 240 hour(s))  Blood Culture (routine x 2)     Status: None (Preliminary result)   Collection Time: 11/18/2021  3:54 PM   Specimen: BLOOD  Result Value Ref Range Status   Specimen Description BLOOD LEFT ANTECUBITAL  Final   Special Requests   Final    BOTTLES DRAWN AEROBIC AND ANAEROBIC Blood Culture adequate volume   Culture   Final    NO GROWTH 2 DAYS Performed at [facility], [place] [address], [place], [place] [address]     Report Status PENDING  Incomplete  MRSA Next Gen by PCR, Nasal     Status: None   Collection Time: 11/28/2021  4:27 PM   Specimen: Nasal Mucosa; Nasal Swab  Result Value Ref Range Status  MRSA by PCR Next Gen NOT DETECTED NOT DETECTED Final    Comment: (NOTE) The GeneXpert MRSA Assay (FDA approved for NASAL specimens only), is one component of a comprehensive MRSA colonization surveillance program. It is not intended to diagnose MRSA infection nor to guide or monitor treatment for MRSA infections. Test performance is not FDA approved in patients less than 54 years old. Performed at [facility], [place] [address] [place], [place] [address]   SARS Coronavirus 2 by RT PCR (hospital order, performed in [facility] hospital lab) *cepheid single result test* Anterior Nasal Swab     Status: None   Collection Time: 11/07/2021  7:42 PM   Specimen: Anterior Nasal Swab  Result Value Ref Range Status   SARS Coronavirus 2 by RT PCR NEGATIVE NEGATIVE Final    Comment: (NOTE) SARS-CoV-2 target nucleic acids are NOT DETECTED.  The SARS-CoV-2 RNA is generally detectable in upper and lower respiratory specimens during the acute phase of infection. The lowest concentration of SARS-CoV-2 viral copies this assay can detect is 250 copies / mL. A negative result does not preclude SARS-CoV-2 infection and should not be used as the sole basis for treatment or other patient management decisions.  A negative result may occur with improper specimen collection / handling, submission of specimen other than nasopharyngeal swab, presence of viral mutation(s) within the areas targeted by this assay, and inadequate number of viral copies (<250 copies / mL). A negative result must be combined with clinical observations, patient history, and epidemiological information.  Fact Sheet for Patients:   https://www.patel.info/  Fact Sheet for Healthcare  Providers: https://hall.com/  This test is not yet approved or  cleared by the [place] FDA and has been authorized for detection and/or diagnosis of SARS-CoV-2 by FDA under an Emergency Use Authorization (EUA).  This EUA will remain in effect (meaning this test can be used) for the duration of the COVID-19 declaration under Section 564(b)(1) of the Act, 21 U.S.C. section 360bbb-3(b)(1), unless the authorization is terminated or revoked sooner.  Performed at [facility], [place] [address], [place],  [address]     RADIOLOGY STUDIES/RESULTS: DG Swallowing Func-Speech Pathology  Result Date: 11/17/2021 Table formatting from the original result was not included. Objective Swallowing Evaluation: Type of Study: MBS-Modified Barium Swallow Study  Patient Details Name: Mark Thomas MRN: [id number] Date of Birth: [birth year] Today's Date: 11/17/2021 Time: SLP Start Time (ACUTE ONLY): 0930 -SLP Stop Time (ACUTE ONLY): 1945 SLP Time Calculation (min) (ACUTE ONLY): 615 min Past Medical History: Past Medical History: Diagnosis Date  Anxiety   Coronary artery disease   Depression   Essential tremor   GERD (gastroesophageal reflux disease)   High cholesterol   Hypertension   Myocardial infarct (HCC)   Orthostatic hypotension   Stroke [facility])   Stroke ([place]) 02/02/2021  Vascular dementia ([place])  Past Surgical History: Past Surgical History: Procedure Laterality Date  [place] N/A 10/06/2021  Procedure: FLEXIBLE BRONCHOSCOPY;  Surgeon: Margaretha Seeds, MD;  Location: [place];  Service: Cardiopulmonary;  Laterality: N/A;  HERNIA REPAIR    NASAL SINUS SURGERY    SHOULDER SURGERY   HPI: Patient is a 71 y.o. male with PMH: vascular dementia, CAD s/p stent, CVA, CKD 3, HTN, paroxysmal atrial fibrillation, dysphagia. He presented to the ED on 10/04/21 with worsening SOB and cough. He has been having progressive SOB since  having Covid infection  04/2021 and has been followed by pulmonology on an OP basis with some improvement in cough. Pulmonology suspect that his symptoms are multi-factorial with ongoing GERD, mild interstitial lung disease pattern seen on CXR. He had an MBS 08/27/21 which did not show any penetration or aspiration of any of the tested consistencies (thin liquid, puree solids, 53m barium tablet,, regular solids). CXR completed on 10/03/21 showed Increased heterogeneous opacities in the left lung, now involving the left upper lung, concerning for worsening pneumonia. Bronchoscopy completed on 10/07/21 which reported that examination of the airway was normal.  Subjective: pleasant, some confusion (wife reports his vascular dementia symptoms are worse this morning), wife in room  Recommendations for follow up therapy are one component of a multi-disciplinary discharge planning process, led by the attending physician.  Recommendations may be updated based on patient status, additional functional criteria and insurance authorization. Assessment / Plan / Recommendation   11/17/2021   9:00 AM Clinical Impressions Clinical Impression Pt demosntrates normal oropharyngeal swallowing function. Risk of prandial aspiration is low. Esophageal sweep with barium tablet showed still, full esophageal column of barium, tablet at distal esophagus, did not pass the GE junction. No radiologist present to confirm. Will suggest esophageal f/u to MD. SLP to sign off. SLP Visit Diagnosis Dysphagia, unspecified (R13.10) Impact on safety and function No limitations;Mild aspiration risk     11/17/2021   9:00 AM Treatment Recommendations Treatment Recommendations No treatment recommended at this time     10/07/2021  11:59 AM Prognosis Prognosis for Safe Diet Advancement Good   11/17/2021   9:00 AM Diet Recommendations SLP Diet Recommendations Regular solids;Thin liquid Liquid Administration via Cup;Straw Medication Administration Crushed with puree  Compensations Slow rate;Small sips/bites;Minimize environmental distractions Postural Changes Remain semi-upright after after feeds/meals (Comment)     11/17/2021   9:00 AM Other Recommendations Recommended Consults Consider esophageal assessment Oral Care Recommendations Oral care BID   08/26/2021   5:37 PM Frequency and Duration  Speech Therapy Frequency (ACUTE ONLY) min 1 x/week Treatment Duration 1 week     11/17/2021   9:00 AM [facility]   11/17/2021   9:00 AM Pharyngeal [facility]   08/27/2021   8:39 AM Cervical Esophageal Phase  Cervical Esophageal Phase [facility], BKatherene Ponto7/19/2023, 9:52 AM                     CT Chest Wo Contrast  Result Date: 11/01/2021 CLINICAL DATA:  Pneumonia, complication suspected, xray done Chest pain and shortness of breath. EXAM: CT CHEST WITHOUT CONTRAST TECHNIQUE: Multidetector CT imaging of the chest was performed following the standard protocol without IV contrast. RADIATION DOSE REDUCTION: This exam was performed according to the departmental dose-optimization program which includes automated exposure control, adjustment of the mA and/or kV according to patient size and/or use of iterative reconstruction technique. COMPARISON:  Radiograph earlier today.  Chest CT 10/08/2021 FINDINGS: Cardiovascular: Stable cardiomegaly. Aortic atherosclerosis and tortuosity. No aortic aneurysm. Left atrial septal occlusion device. Atrial septal occlusion device. Pacemaker wires with tip in the right ventricle and right atrial appendage. No pericardial effusion. Mediastinum/Nodes: Increasing mediastinal adenopathy, right paratracheal node measures 16 mm, series 3, image 50, previously 10 mm. Additional prominent and mildly enlarged mediastinal lymph nodes are similar or slightly increased in size. 13 mm subcarinal node, series 3, image 68, unchanged. Limited assessment for hilar adenopathy on this unenhanced exam. No visualized thyroid nodule. Wall  thickening of the distal esophagus with  similar small to moderate hiatal hernia. Lungs/Pleura: Shifting pulmonary opacities from prior exam. The left lower lobe airspace disease has significantly improved with residual areas of ground-glass. Additional mild areas of ground-glass involving the left upper lobe. There is worsening patchy and ground-glass opacity throughout the right lung with new consolidative components in the right upper lobe. Central air bronchograms within the consolidated components. Subpleural reticulation and interstitial thickening in the basilar right middle and lower lobes. Decreased pleural effusions with only trace residual, more so on the right. No pneumothorax. The trachea and central bronchi are unremarkable. Upper Abdomen: No acute upper abdominal findings. Stable exophytic cyst from the upper right kidney, needing no further follow-up. Renal renal lesions which were fully included on abdominal CT 08/26/2021. Nonobstructing upper pole right renal stone. Musculoskeletal: Chronic L1 compression fracture when compared with 08/26/2021 abdominal CT. There are no acute or suspicious osseous abnormalities. IMPRESSION: 1. Shifting pulmonary opacities from CT last month. Worsening patchy and ground-glass opacity throughout the right lung with new consolidative components in the right upper lobe. There is overall improvement in the left lung opacities from prior. Differential consideration is broad and includes both infectious and noninfectious etiologies. Cryptogenic organizing pneumonia, or other inflammatory conditions are considered. 2. Decreased pleural effusions with only trace residual, more so on the right. 3. Increasing mediastinal adenopathy, likely reactive. 4. Stable cardiomegaly. 5. Distal esophageal wall thickening with small to moderate hiatal hernia, can be seen with reflux or esophagitis. Aortic Atherosclerosis (ICD10-I70.0). Electronically Signed   By: Keith Rake M.D.    On: 11/12/2021 23:47   DG Chest Port 1 View  Result Date: 11/05/2021 CLINICAL DATA:  Sepsis presentation EXAM: PORTABLE CHEST 1 VIEW COMPARISON:  11/12/2021 FINDINGS: Cardiomegaly. Aortic atherosclerosis. Dual lead pacemaker with leads in the region of the right atrium and right ventricle. Chronic lung markings at the left lung base. On the right, patchy infiltrates previously seen have worsened, particularly in the right mid lung, consistent with bronchopneumonia. IMPRESSION: Worsening patchy infiltrates in the right lung consistent with bronchopneumonia. Electronically Signed   By: Nelson Chimes M.D.   On: 11/09/2021 15:47     LOS: 2 days   Oren Binet, MD  [facility]    To contact the attending provider between 7A-7P or the covering provider during after hours 7P-7A, please log into the web site [email or web address] and access using universal [place] password for that web site. If you do not have the password, please call the hospital operator.  11/17/2021, 1:43 PM

## 2021-11-17 NOTE — Progress Notes (Signed)
Pharmacy Antibiotic Note  Mark Thomas is a 71 y.o. male admitted on day #3 Vancomycin and Cefepime for HCAP coverage. Ceftriaxone and Doxycyline started 2 days prior to admission but no improvement. To transition to Unasyn for aspiration pneumonia coverage.  Hx PCN allergy as a child, reaction unknown.  Has had Augmentin in the past.   Plan: Unasyn 3 gm IV q6h Follow renal function, clinical course, abx plans.  Height: _0  (185.4 cm) Weight: 108.2 kg (238 lb 8.6 oz) IBW/kg (Calculated) : 79.9  Temp (24hrs), Avg:98.7 F (37.1 C), Min:98.5 F (36.9 C), Max:98.8 F (37.1 C)  Recent Labs  Lab 11/03/2021 1519 11/09/2021 1554 11/29/2021 1737 11/16/21 0413 11/17/21 0441  WBC 20.0*  --   --  11.3* 11.4*  CREATININE 1.53*  --   --  1.12 1.03  LATICACIDVEN  --  2.9* 2.5*  --   --     Estimated Creatinine Clearance: 88.5 mL/min (by C-G formula based on SCr of 1.03 mg/dL).    Allergies  Allergen Reactions   Dilaudid [Hydromorphone] Other (See Comments)    Respiratory Arrest!!!!   Bactrim [Sulfamethoxazole-Trimethoprim] Rash   Benadryl [Diphenhydramine] Other (See Comments)    Muscle spasms   Phenobarbital Other (See Comments)    From childhood- reaction not recalled at this time   Penicillins Other (See Comments)    From childhood- reaction not recalled at this time    Antimicrobials this admission:  Ceftriaxone and Doxycycline PTA x 2 days  Vancomycin 7/17>>7/19  Cefepime 7/17 >>7/19  Fluconazole IV 7/18 >>  Unasyn 7/19 >>   Microbiology results:  7/17 blood: no growth x 2 days to date  7/17 COVID: neg  7/17 MRSA PCR: neg  Thank you for allowing pharmacy to be a part of this patient's care.  Arty Baumgartner, [place] 11/17/2021 4:27 PM

## 2021-11-17 NOTE — Consult Note (Addendum)
[facility] Consult: 3:11 PM 11/17/2021  LOS: 2 days    Referring Provider:  Dr Nena Alexander  Primary Care Physician:  Luetta Nutting, DO Primary Gastroenterologist:  unassigned.  Has seen GI doctors in [place] and [place].  Patient's spouse of 45 years was at the bedside and provided some of the medical history.  Reason for Consultation: Dysphagia with aspiration suspected   HPI: Mark Thomas is a 71 y.o. male.  Extensive past medical history, please see list below.  Problems include previous CVAs.  On Plavix.  A-fib, previous RFA, not on anticoagulation.  Previous Watchman procedure and repair atrial septal defect.  On oral iron for chronic anemia.  Chronic hypoxia.  CKD 3a.  Vascular dementia.  Esophageal and gastric ulcers.  Pleuritic type right chest pain for which he takes Percocet.  Orthostatic hypotension, on midodrine.  Obesity.  Review in care everywhere indicates patient has undergone EGDs in June 2003 and April 2017.  In 2003 he had healed esophageal ulcers, healing gastric ulcer and a gastric polyp removed (pathology not in record).  In 2017 a pathology report indicates he had gastritis and benign gastric mucosa with superficial vascular congestion.  A colonoscopy in 08/2015 revealed small polyps and nonbleeding hemorrhoids, (unable to find pathology report on polyps). Dysphagia previously attributed to post stroke etiology. 08/06/2021 noncontrast CTAP with 12 to 13 mm CBD.  Possible mild stranding versus motion artifact at distal esophagus/proximal stomach.  2.1 cm exophytic lesion at left kidney, hemorrhagic cyst versus solid neoplasm.  Bilateral nonobstructing nephrolithiasis  Currently on his fifth admission  for 2023, all of these since early April.  Problems addressed include recurrent pneumonias,  AKI.  Notes mention he has had persistent recurrent pneumonias since having COVID in December despite multiple rounds of antibiotics and steroids with little improvement.  In late April he had parainfluenza pneumonia.  Repeated chest imaging shows no resolution of pneumonias and sometimes worsening of pneumonias on either or both lungs.  Modified barium swallow study in late April did not confirm any aspiration. Most recent admission was 6/20-6/27 again for pneumonia and respiratory failure/hypoxia.  Felt to have ILD in addition to the pneumonias.  Outpatient follow-up chest x-ray on 7/14 shows no significant change in asymmetric opacity in right mid and lower lungs, stable bilateral pleural thickening versus small pleural effusion now admitted again with recurrent pneumonia  Returned to hospital, admitted 7/17 w recurrent PNA.   Current antimicrobials include Unasyn, Diflucan. CXR showed worsening right lung infiltrates.  CT chest shows shifting pulmonary opacities compared with CT in June.  Overall worsening patchy, groundglass opacities in right lung and new consolidation in right upper lobe.  Left lung opacities overall improved.  Differential broad and includes infectious, noninfectious causes, cryptogenic organizing pneumonia or other inflammatory conditions.  Decreased pleural effusions.  Likely reactive increased mediastinal adenopathy.  Wall thickening at distal esophagus with small to moderate hiatal hernia, may reflect reflux esophagitis 7/19 MBSS: Normal oropharyngeal swallow, low PO associated aspiration risk.  Esophageal sweep demonstrated a full column of barium in the  esophagus, tablet did not pass through distal esophagus. 7/19 barium esophagram.  Study limited as patient unable to stand or significantly reposition himself on fluoroscopy table.  Demonstrated narrowed distal esophagus/GE junction.  13 mm tablet did not pass this area.  Felt likely reflection of high-grade distal esophageal  stricture, consider endoscopy.  Additionally there is esophageal dysmotility and decreased peristalsis and moderate hiatal hernia.  Hgb 9.8, MCV 99.  Platelets 146.  WBCs 11.4. Sodium, potassium, BUN/creatinine normal.  LFTs normal with exception of low albumin at 2.3, low protein at 5.7.  Calcium low 8.8.  Patient describes very rare dysphagia where he feels like he is going to choke.  He says the last time it happened was 10 months ago.  His wife however says that he frequently manipulates his jaw and neck when he swallows in order to have effective swallowing.  Has not seen regurgitation.  Lost around 20 pounds in the last month.  Clopidogrel is not on hold.  Latest dose given at 1120 this morning.  Married.  Most recently residing at rehab center.  Old notes indicate he has a remote history of heavy alcohol use. Family history of stroke, hypertension in both mother and father.    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   Essential tremor    Gait disorder 11/2020   GERD (gastroesophageal reflux disease)    High cholesterol    Hypertension    Myocardial infarct (HCC)    Orthostatic hypotension    SSS (sick sinus syndrome) (HCC)    dual chamber pacemaker placed, date unknown   Stroke ([place]) 02/02/2021   multiple   Vascular dementia [facility])     Past Surgical History:  Procedure Laterality Date   APPENDECTOMY     ASD REPAIR  01/2020   CARDIAC SURGERY     CHOLECYSTECTOMY, LAPAROSCOPIC  01/2019   with ventral hernia repair, surgical findings of liver between GB and liver.  At [place]  08/2015   At [facility] small polyps in ascending colon.  Nonbleeding internal hemorrhoids   ESOPHAGOGASTRODUODENOSCOPY  09/2001   In [facility].  Vernie Shanks MD. healed  esophageal ulcers.  Healing gastric ulcer.  Gastric polyp removed   ESOPHAGOGASTRODUODENOSCOPY  08/2015   no report, just path from "gastritis": Benign gastric mucosa with superficial vascular congestion   FLEXIBLE BRONCHOSCOPY N/A 10/06/2021   Procedure: FLEXIBLE BRONCHOSCOPY;  Surgeon: Margaretha Seeds, MD;  Location: [place];  Service: Cardiopulmonary;  Laterality: N/A;   HERNIA REPAIR  01/2019   ventral hernia repair w cholecystectomy.  at [place]  03/2019   watchman procedure 03/2019   NASAL SINUS SURGERY     SHOULDER SURGERY  2013   R Rot cuff repair. around 2013.    Prior to Admission medications   Medication Sig Start Date End Date Taking? Authorizing Provider  acetaminophen (TYLENOL) 500 MG tablet Take 1,000 mg by mouth 2 (two) times daily. 10/28/21  Yes [provider]  albuterol (PROVENTIL) (2.5 MG/3ML) 0.083% nebulizer solution Take 3 mLs (2.5 mg total) by nebulization every 6 (six) hours as needed for wheezing or shortness of breath. Patient taking differently: Take 2.5 mg by nebulization every 4 (four) hours as needed for  shortness of breath. 07/15/21  Yes Luetta Nutting, DO  albuterol (VENTOLIN HFA) 108 (90 Base) MCG/ACT inhaler Inhale 2 puffs into the lungs every 6 (six) hours as needed for wheezing or shortness of breath. 10/26/21  Yes [provider]  allopurinol (ZYLOPRIM) 100 MG tablet Take 100 mg by mouth daily. 09/22/20  Yes [provider]  aspirin 81 MG EC tablet Take 81 mg by mouth daily. 05/05/14  Yes [provider]  atorvastatin (LIPITOR) 80 MG tablet Take 80 mg by mouth daily. 11/18/20  Yes [provider]  b complex vitamins capsule Take 1 capsule by mouth daily.   Yes [provider]  cefTRIAXone (ROCEPHIN) 1 g injection Inject 1 g into the muscle daily. Ceftriaxone injection to be mixed with 2.1 mL of lidocaine 1% to avoid pain at injection site. 11/14/21 11/17/21 Yes [provider]  Cholecalciferol (VITAMIN D3) 10 MCG (400 UNIT) CAPS Take 400 Units by mouth in the morning.   Yes [provider]  clopidogrel (PLAVIX) 75 MG tablet Take 75 mg by mouth daily. 11/18/20  Yes [provider]  dextromethorphan-guaiFENesin (MUCINEX DM) 30-600 MG 12hr tablet Take 1 tablet by mouth 2 (two) times daily. 10/26/21  Yes [provider]  doxycycline (VIBRA-TABS) 100 MG tablet Take 100 mg by mouth 2 (two) times daily. 11/13/21 11/18/21 Yes [provider]  escitalopram (LEXAPRO) 20 MG tablet Take 1 tablet (20 mg total) by mouth daily. Patient taking differently: Take 30 mg by mouth daily. 11/04/21  Yes Merian Capron, MD  ferrous sulfate 325 (65 FE) MG EC tablet Take 1 tablet (325 mg total) by mouth in the morning and at bedtime. Patient taking differently: Take 325 mg by mouth every other day. 03/19/21 10/31/2021 Yes Luetta Nutting, DO  furosemide (LASIX) 20 MG tablet Take 20 mg by mouth every other day. 10/29/21  Yes [provider]  ipratropium-albuterol (DUONEB) 0.5-2.5 (3) MG/3ML SOLN Take 3 mLs by nebulization every 8 (eight) hours. 10/18/21  Yes [provider]  latanoprost (XALATAN) 0.005 % ophthalmic solution Place 1 drop into both eyes at bedtime. 12/07/20  Yes [provider]  lidocaine (XYLOCAINE) 1 % (with preservative) injection 2.1 mLs See admin instructions. Ceftriaxone injection to be mixed with 2.1 mL of lidocaine 1% 11/13/21  Yes [provider]  magnesium oxide (MAG-OX) 400 MG tablet Take 400 mg by mouth daily.   Yes [provider]  memantine (NAMENDA) 10 MG tablet TAKE 1 TABLET TWICE A DAY Patient taking differently: Take 10 mg by mouth 2 (two) times daily. 09/21/21  Yes Alric Ran, MD  Metoprolol Tartrate 37.5 MG TABS Take 37.5 mg by mouth 2 (two) times daily. 10/26/21  Yes Shawna Clamp, MD  midodrine (PROAMATINE) 5 MG tablet Take 0.5 tablets (2.5 mg total) by mouth 3 (three) times  daily with meals. 10/14/21  Yes Ghimire, Henreitta Leber, MD  mometasone-formoterol (DULERA) 200-5 MCG/ACT AERO Inhale 2 puffs into the lungs 2 (two) times daily. 09/14/21  Yes Freddi Starr, MD  nitroGLYCERIN (NITROLINGUAL) 0.4 MG/SPRAY spray Place 1 spray under the tongue every 5 (five) minutes x 3 doses as needed for chest pain. 05/05/14  Yes [provider]  oxyCODONE-acetaminophen (PERCOCET/ROXICET) 5-325 MG tablet Take 1 tablet by mouth every 12 (twelve) hours as needed for moderate pain. Patient taking differently: Take 0.5 tablets by mouth every 12 (twelve) hours. 10/26/21  Yes Shawna Clamp, MD  pantoprazole (PROTONIX) 40 MG tablet Take 1 tablet (40 mg total) by  mouth every evening. 07/07/21  Yes Luetta Nutting, DO  polyethylene glycol (MIRALAX / GLYCOLAX) 17 g packet Take 17 g by mouth daily. 17 grams, oral, once a day, bowels-in 6-8 oz water or juice   Yes [provider]  predniSONE (DELTASONE) 10 MG tablet Take 20 mg by mouth daily. Give 2 tabs (42m) daily for 1 week for inflammation 11/12/21 11/19/21 Yes [provider]  primidone (MYSOLINE) 50 MG tablet Take 2 tablets (100 mg total) by mouth 2 (two) times daily. 08/24/21 11/22/21 Yes Camara, AMaryan Puls MD  Probiotic, Lactobacillus, CAPS Take 1 capsule by mouth daily. Lactobacillus combination no.4, 5 billion cell capsule. Give 1 capsule po daily for 10 days for supplement. 11/12/21 11/21/21 Yes [provider]  QUEtiapine (SEROQUEL) 100 MG tablet Take 100 mg by mouth at bedtime. 10/26/21  Yes [provider]  ranolazine (RANEXA) 1000 MG SR tablet Take 1,000 mg by mouth in the morning and at bedtime.   Yes [provider]  rOPINIRole (REQUIP XL) 4 MG 24 hr tablet Take 1 tablet (4 mg total) by mouth at bedtime. 09/23/21 09/18/22 Yes CAlric Ran MD  Tiotropium Bromide Monohydrate 2.5 MCG/ACT AERS Inhale 2 each into the lungs daily. 11/14/21  Yes [provider]  triamcinolone ointment  (KENALOG) 0.1 % Apply 1 application. topically daily as needed (to affected areas- for psoriasis flares). 01/21/21  Yes [provider]  QUEtiapine (SEROQUEL) 50 MG tablet Take 2 tablets (100 mg total) by mouth at bedtime. Patient not taking: Reported on 11/28/2021 11/04/21 02/02/22  AMerian Capron MD  FLUoxetine (PROZAC) 20 MG capsule Take 1 capsule (20 mg total) by mouth daily. 09/21/21 09/28/21  AMerian Capron MD    Scheduled Meds:  allopurinol  100 mg Oral Daily   aspirin EC  81 mg Oral Daily   atorvastatin  80 mg Oral Daily   clopidogrel  75 mg Oral Daily   enoxaparin (LOVENOX) injection  40 mg Subcutaneous Q24H   escitalopram  20 mg Oral Daily   ipratropium-albuterol  3 mL Nebulization BID   latanoprost  1 drop Both Eyes QHS   memantine  10 mg Oral BID   methylPREDNISolone (SOLU-MEDROL) injection  40 mg Intravenous Daily   metoprolol tartrate  37.5 mg Oral BID   midodrine  2.5 mg Oral TID WC   mometasone-formoterol  2 puff Inhalation BID   oxyCODONE-acetaminophen  1-2 tablet Oral Q8H   pantoprazole  40 mg Oral QPM   primidone  100 mg Oral BID   QUEtiapine  100 mg Oral QHS   ranolazine  1,000 mg Oral BID   rOPINIRole  4 mg Oral QHS   saccharomyces boulardii  250 mg Oral BID   sodium chloride flush  3 mL Intravenous Q12H   Infusions:  ampicillin-sulbactam (UNASYN) IV     fluconazole (DIFLUCAN) IV 100 mg (11/17/21 1443)   PRN Meds: acetaminophen **OR** acetaminophen, albuterol, LORazepam, polyethylene glycol   Allergies as of 11/09/2021 - Review Complete 11/16/2021  Allergen Reaction Noted   Dilaudid [hydromorphone] Other (See Comments) 11/09/2020   Bactrim [sulfamethoxazole-trimethoprim] Rash 04/28/2021   Benadryl [diphenhydramine] Other (See Comments) 04/09/2021   Phenobarbital Other (See Comments) 11/09/2020   Penicillins Other (See Comments) 11/09/2020    Family History  Problem Relation Age of Onset   Hypertension Mother    Stroke Mother    Stroke Father     Hypertension Father     Social History   Socioeconomic History   Marital status: Married  Spouse name: Mark Thomas   Number of children: 2   Years of education: 20   Highest education level: Master's degree (e.g., MA, MS, MEng, MEd, MSW, MBA)  Occupational History   Occupation: Retired  Tobacco Use   Smoking status: Never    Passive exposure: Never   Smokeless tobacco: Never  Vaping Use   Vaping Use: Never used  Substance and Sexual Activity   Alcohol use: Not Currently   Drug use: Never   Sexual activity: Not on file  Other Topics Concern   Not on file  Social History Narrative   Lives with his wife. He has vascular dementia. His wife is his primary caretaker.He enjoys watching football and spending time with his grandchildren.   Social Determinants of Health   Financial Resource Strain: Low Risk  (03/15/2021)   Overall Financial Resource Strain (CARDIA)    Difficulty of Paying Living Expenses: Not hard at all  Food Insecurity: No Food Insecurity (03/15/2021)   Hunger Vital Sign    Worried About Running Out of Food in the Last Year: Never true    Ran Out of Food in the Last Year: Never true  Transportation Needs: No Transportation Needs (03/15/2021)   PRAPARE - Hydrologist (Medical): No    Lack of Transportation (Non-Medical): No  Physical Activity: Insufficiently Active (03/15/2021)   Exercise Vital Sign    Days of Exercise per Week: 2 days    Minutes of Exercise per Session: 50 min  Stress: No Stress Concern Present (03/15/2021)   [place]    Feeling of Stress : Not at all  Social Connections: [place] (03/15/2021)   Social Connection and Isolation Panel [NHANES]    Frequency of Communication with Friends and Family: More than three times a week    Frequency of Social Gatherings with Friends and Family: Once a week    Attends Religious Services:  More than 4 times per year    Active Member of [employer] or Organizations: Yes    Attends Music therapist: More than 4 times per year    Marital Status: Married  Human resources officer Violence: Not At Risk (03/15/2021)   Humiliation, Afraid, Rape, and Kick questionnaire    Fear of Current or Ex-Partner: No    Emotionally Abused: No    Physically Abused: No    Sexually Abused: No    REVIEW OF SYSTEMS: Constitutional: No fatigue, general weakness. ENT:  No nose bleeds Pulm: Shortness of breath improved.  Some residual cough. CV:  No palpitations, no LE edema.  No angina GU:  No hematuria, no frequency GI: HPI Heme: Unusual bleeding or bruising Transfusions: Was transfused with PRBCs within the last 5 years at outside hospital neurology according to the wife.  This was for anemia and not for a GI bleed. Neuro:  No headaches, no peripheral tingling or numbness.  No syncope, seizures. Derm:  No itching, no rash or sores.  Endocrine:  No sweats or chills.  No polyuria or dysuria Immunization: Not queried. Travel:  None beyond local counties in last few months.    PHYSICAL EXAM: Vital signs in last 24 hours: Vitals:   11/17/21 0735 11/17/21 0747  BP: 120/68   Pulse: 79   Resp: 15   Temp: 98.7 F (37.1 C)   SpO2: 99% 99%   Wt Readings from Last 3 Encounters:  11/16/21 108.2 kg  11/12/21 105.2 kg  10/26/21 105.6  kg    General: Patient looks chronically unwell.  He is alert and participating in the conversation. Head: Facial asymmetry or swelling.  No signs of head trauma. Eyes: Slight conjunctival pallor.  EOMI. Ears: No gross hearing deficit Nose: No congestion or discharge Mouth: Good dentition, the overwhelming bulk of his native dentition remains.  Some fillings.  Tongue midline.  Mucosa moist, pink, clear. Neck: No JVD, no masses, no thyromegaly Lungs: Crackles at the bases.  No resting dyspnea.  No cough during interaction. Heart: RRR.  Systolic murmur.  S1, S2  present Abdomen: Soft without tenderness.  No masses, HSM, bruits, hernias.   Rectal: Deferred Musc/Skeltl: No gross joint erythema or swelling.  Some arthritic changes in the feet, hands. Extremities: No CCE Neurologic: Alert.  Appropriate.  Oriented to place, self, year. Skin: No telangiectasia, no suspicious lesions rashes or sores.   Psych: Pleasant, cooperative, calm  Intake/Output from previous day: 07/18 0701 - 07/19 0700 In: 150 [IV Piggyback:150] Out: 550 [Urine:550] Intake/Output this shift: No intake/output data recorded.  LAB RESULTS: Recent Labs    11/13/2021 1519 11/16/21 0413 11/17/21 0441  WBC 20.0* 11.3* 11.4*  HGB 12.0* 9.8* 9.8*  HCT 39.4 31.5* 30.3*  PLT 198 120* 146*   BMET Lab Results  Component Value Date   NA 138 11/17/2021   NA 137 11/16/2021   NA 135 11/04/2021   K 4.5 11/17/2021   K 3.8 11/16/2021   K 4.3 11/07/2021   CL 104 11/17/2021   CL 103 11/16/2021   CL 98 11/05/2021   CO2 24 11/17/2021   CO2 25 11/16/2021   CO2 22 11/06/2021   GLUCOSE 147 (H) 11/17/2021   GLUCOSE 102 (H) 11/16/2021   GLUCOSE 201 (H) 11/17/2021   BUN 20 11/17/2021   BUN 17 11/16/2021   BUN 18 11/13/2021   CREATININE 1.03 11/17/2021   CREATININE 1.12 11/16/2021   CREATININE 1.53 (H) 11/18/2021   CALCIUM 8.8 (L) 11/17/2021   CALCIUM 8.6 (L) 11/16/2021   CALCIUM 9.1 11/26/2021   LFT Recent Labs    11/24/2021 1519 11/16/21 0413  PROT 7.1 5.7*  ALBUMIN 2.9* 2.3*  AST 45* 23  ALT 45* 31  ALKPHOS 56 43  BILITOT 0.7 0.5   PT/INR Lab Results  Component Value Date   INR 1.0 11/10/2021   INR 1.2 10/03/2021   INR 1.1 08/26/2021   Hepatitis Panel No results for input(s): "HEPBSAG", "HCVAB", "HEPAIGM", "HEPBIGM" in the last 72 hours. C-Diff No components found for: "CDIFF" Lipase     Component Value Date/Time   LIPASE 23 08/26/2021 1241    Drugs of Abuse     Component Value Date/Time   LABOPIA NONE DETECTED 01/07/2021 1856   COCAINSCRNUR NONE  DETECTED 01/07/2021 1856   LABBENZ NONE DETECTED 01/07/2021 1856   AMPHETMU NONE DETECTED 01/07/2021 1856   THCU NONE DETECTED 01/07/2021 1856   LABBARB POSITIVE (A) 01/07/2021 1856     RADIOLOGY STUDIES: DG ESOPHAGUS W SINGLE CM (SOL OR THIN BA)  Result Date: 11/17/2021 CLINICAL DATA:  Dementia, recurrent pneumonia, concern for aspiration. Barium tablet did not move beyond the GE junction on same day modified barium swallow. EXAM: ESOPHAGUS/BARIUM SWALLOW/TABLET STUDY TECHNIQUE: A single contrast esophagram was performed using thin liquid barium. Additionally, the patient swallowed a 13 mm barium tablet under fluoroscopy. The exam was performed by Candiss Norse, PA-C, and was supervised and interpreted by Kellie Simmering, D.O. FLUOROSCOPY: Fluoroscopy time: 2 minutes, 54 seconds (69.30 mGy). COMPARISON:  Modified barium  swallow 11/17/2021. Chest CT 10/31/2021. FINDINGS: Limited examination due to the patient's inability to stand and limited ability to reposition on the fluoroscopy table. Markedly narrowed appearance of the distal esophagus/GE junction. A swallowed 13 mm barium tablet not pass beyond this point despite a prolonged period of observation. The esophagus is otherwise appears normal in caliber and smooth in contour. Esophageal dysmotility with decreased peristalsis. Moderate-sized hiatal hernia. No gastroesophageal reflux was observed. IMPRESSION: Limited examination due to the patient's inability to stand and limited ability to reposition on the fluoroscopy table. Markedly narrowed appearance of the distal esophagus/GE junction. A swallowed 13 mm barium tablet did not pass beyond this point despite a prolonged period of observation. These findings likely reflect a high-grade distal esophageal stricture and endoscopy should be considered for further evaluation. Esophageal dysmotility with decreased peristalsis. Moderate size hiatal hernia. Electronically Signed   By: Kellie Simmering D.O.   On:  11/17/2021 14:20   DG Swallowing Func-Speech Pathology  Result Date: 11/17/2021 Table formatting from the original result was not included. Objective Swallowing Evaluation: Type of Study: MBS-Modified Barium Swallow Study  Patient Details Name: Mark Thomas MRN: [id number] Date of Birth: [birth year] Today's Date: 11/17/2021 Time: SLP Start Time (ACUTE ONLY): 0930 -SLP Stop Time (ACUTE ONLY): 1945 SLP Time Calculation (min) (ACUTE ONLY): 615 min Past Medical History: Past Medical History: Diagnosis Date  Anxiety   Coronary artery disease   Depression   Essential tremor   GERD (gastroesophageal reflux disease)   High cholesterol   Hypertension   Myocardial infarct (HCC)   Orthostatic hypotension   Stroke [facility])   Stroke ([place]) 02/02/2021  Vascular dementia ([place])  Past Surgical History: Past Surgical History: Procedure Laterality Date  [place] N/A 10/06/2021  Procedure: FLEXIBLE BRONCHOSCOPY;  Surgeon: Margaretha Seeds, MD;  Location: [facility];  Service: Cardiopulmonary;  Laterality: N/A;  HERNIA REPAIR    NASAL SINUS SURGERY    SHOULDER SURGERY   HPI: Patient is a 71 y.o. male with PMH: vascular dementia, CAD s/p stent, CVA, CKD 3, HTN, paroxysmal atrial fibrillation, dysphagia. He presented to the ED on 10/04/21 with worsening SOB and cough. He has been having progressive SOB since having Covid infection 04/2021 and has been followed by pulmonology on an OP basis with some improvement in cough. Pulmonology suspect that his symptoms are multi-factorial with ongoing GERD, mild interstitial lung disease pattern seen on CXR. He had an MBS 08/27/21 which did not show any penetration or aspiration of any of the tested consistencies (thin liquid, puree solids, 14m barium tablet,, regular solids). CXR completed on 10/03/21 showed Increased heterogeneous opacities in the left lung, now involving the left upper lung, concerning for worsening pneumonia. Bronchoscopy  completed on 10/07/21 which reported that examination of the airway was normal.  Subjective: pleasant, some confusion (wife reports his vascular dementia symptoms are worse this morning), wife in room  Recommendations for follow up therapy are one component of a multi-disciplinary discharge planning process, led by the attending physician.  Recommendations may be updated based on patient status, additional functional criteria and insurance authorization. Assessment / Plan / Recommendation   11/17/2021   9:00 AM Clinical Impressions Clinical Impression Pt demosntrates normal oropharyngeal swallowing function. Risk of prandial aspiration is low. Esophageal sweep with barium tablet showed still, full esophageal column of barium, tablet at distal esophagus, did not pass the GE junction. No radiologist present to confirm. Will suggest esophageal  f/u to MD. SLP to sign off. SLP Visit Diagnosis Dysphagia, unspecified (R13.10) Impact on safety and function No limitations;Mild aspiration risk     11/17/2021   9:00 AM Treatment Recommendations Treatment Recommendations No treatment recommended at this time     10/07/2021  11:59 AM Prognosis Prognosis for Safe Diet Advancement Good   11/17/2021   9:00 AM Diet Recommendations SLP Diet Recommendations Regular solids;Thin liquid Liquid Administration via Cup;Straw Medication Administration Crushed with puree Compensations Slow rate;Small sips/bites;Minimize environmental distractions Postural Changes Remain semi-upright after after feeds/meals (Comment)     11/17/2021   9:00 AM Other Recommendations Recommended Consults Consider esophageal assessment Oral Care Recommendations Oral care BID   08/26/2021   5:37 PM Frequency and Duration  Speech Therapy Frequency (ACUTE ONLY) min 1 x/week Treatment Duration 1 week     11/17/2021   9:00 AM Oral Phase Oral Phase [facility]    11/17/2021   9:00 AM Pharyngeal Phase Pharyngeal Phase [facility]    08/27/2021   8:39 AM Cervical Esophageal Phase  Cervical Esophageal  Phase [facility], Katherene Ponto 11/17/2021, 9:52 AM                     CT Chest Wo Contrast  Result Date: 11/29/2021 CLINICAL DATA:  Pneumonia, complication suspected, xray done Chest pain and shortness of breath. EXAM: CT CHEST WITHOUT CONTRAST TECHNIQUE: Multidetector CT imaging of the chest was performed following the standard protocol without IV contrast. RADIATION DOSE REDUCTION: This exam was performed according to the departmental dose-optimization program which includes automated exposure control, adjustment of the mA and/or kV according to patient size and/or use of iterative reconstruction technique. COMPARISON:  Radiograph earlier today.  Chest CT 10/08/2021 FINDINGS: Cardiovascular: Stable cardiomegaly. Aortic atherosclerosis and tortuosity. No aortic aneurysm. Left atrial septal occlusion device. Atrial septal occlusion device. Pacemaker wires with tip in the right ventricle and right atrial appendage. No pericardial effusion. Mediastinum/Nodes: Increasing mediastinal adenopathy, right paratracheal node measures 16 mm, series 3, image 50, previously 10 mm. Additional prominent and mildly enlarged mediastinal lymph nodes are similar or slightly increased in size. 13 mm subcarinal node, series 3, image 68, unchanged. Limited assessment for hilar adenopathy on this unenhanced exam. No visualized thyroid nodule. Wall thickening of the distal esophagus with similar small to moderate hiatal hernia. Lungs/Pleura: Shifting pulmonary opacities from prior exam. The left lower lobe airspace disease has significantly improved with residual areas of ground-glass. Additional mild areas of ground-glass involving the left upper lobe. There is worsening patchy and ground-glass opacity throughout the right lung with new consolidative components in the right upper lobe. Central air bronchograms within the consolidated components. Subpleural reticulation and interstitial thickening in the basilar right middle and  lower lobes. Decreased pleural effusions with only trace residual, more so on the right. No pneumothorax. The trachea and central bronchi are unremarkable. Upper Abdomen: No acute upper abdominal findings. Stable exophytic cyst from the upper right kidney, needing no further follow-up. Renal renal lesions which were fully included on abdominal CT 08/26/2021. Nonobstructing upper pole right renal stone. Musculoskeletal: Chronic L1 compression fracture when compared with 08/26/2021 abdominal CT. There are no acute or suspicious osseous abnormalities. IMPRESSION: 1. Shifting pulmonary opacities from CT last month. Worsening patchy and ground-glass opacity throughout the right lung with new consolidative components in the right upper lobe. There is overall improvement in the left lung opacities from prior. Differential consideration is broad and includes both infectious and noninfectious etiologies. Cryptogenic organizing pneumonia, or other inflammatory  conditions are considered. 2. Decreased pleural effusions with only trace residual, more so on the right. 3. Increasing mediastinal adenopathy, likely reactive. 4. Stable cardiomegaly. 5. Distal esophageal wall thickening with small to moderate hiatal hernia, can be seen with reflux or esophagitis. Aortic Atherosclerosis (ICD10-I70.0). Electronically Signed   By: Keith Rake M.D.   On: 11/19/2021 23:47   DG Chest Port 1 View  Result Date: 11/20/2021 CLINICAL DATA:  Sepsis presentation EXAM: PORTABLE CHEST 1 VIEW COMPARISON:  11/12/2021 FINDINGS: Cardiomegaly. Aortic atherosclerosis. Dual lead pacemaker with leads in the region of the right atrium and right ventricle. Chronic lung markings at the left lung base. On the right, patchy infiltrates previously seen have worsened, particularly in the right mid lung, consistent with bronchopneumonia. IMPRESSION: Worsening patchy infiltrates in the right lung consistent with bronchopneumonia. Electronically Signed   By:  Nelson Chimes M.D.   On: 10/31/2021 15:47      IMPRESSION:   Dysphagia.  Modified barium swallow study and barium esophagram concerning for distal esophageal stricture.     Recurrent, refractory to therapy pneumonias bilaterally.  Source may be aspiration.  History CVA, chronic Plavix not on hold, last dose was today 7/19 in AM.  [place] anemia.  Hb 9.8.  PLAN:       Per Dr Loletha Carrow.   Likely will need to hold Plavix in order to proceed with EGD and esophageal dilatation but await MD guidance.  Discussed with attending physician and he will start holding Plavix.  Continue current heart healthy diet.   Azucena Freed  11/17/2021, 3:11 PM Phone [phone]  I have taken an interval history, thoroughly reviewed the chart and examined the patient. I agree with the Advanced Practitioner's note, impression and recommendations, and have recorded additional findings, impressions and recommendations below. I performed a substantive portion of this encounter (>50% time spent), including a complete performance of the medical decision making.  My additional thoughts are as follows:  This is a medically complex patient with extensive history requiring prolonged chart review and review of multiple imaging studies and other data points.  It is difficult to get a clear history from Mr. Hulgan regarding his dysphagia as he has early stages of vascular dementia. However, his overall clinical picture of recurrent pneumonia, and the current findings on swallowing studies suggest that he has a significant component of esophageal dysphagia.  Although his MBS on April 28 reported passage of liquid and tablet barium, those images only show the most proximal esophagus.  His current swallowing evaluation shows good oropharyngeal swallow function without aspiration witnessed.  Although there were technical limitations on his current barium esophagram due to his condition and inability to stand, it is still a sufficient  quality study to show that he has either a severe distal esophageal stricture or perhaps achalasia.  I reviewed those images in detail, and the prolonged passage of a barium column along with a bird's beak type appearance and reportedly diminished motility strongly suggest achalasia.  All of this is likely causing regurgitation of esophageal contents to the airway and contributing to recurrent pneumonia.  He needs an upper endoscopy with probable balloon dilation and possible Botox injection of the LES.  To that end, he must be off Plavix 5 days before the procedure due to the risk of bleeding from esophageal balloon dilation. That would put the procedure likely on 11/22/2021 at the earliest. He needs more time for his pneumonia to improve.  Although he does not have respiratory distress,  he still has a cough and a prolonged expiratory phase on exam.  I will write an order to keep his head about elevated 45 degrees, and I discussed the case with his hospitalist as well.  I gave his wife Mark Thomas an update by phone, and we will also obtain procedure consent from her as we drawn near to the procedure date.  I did discuss the upper endoscopy with Mr. Killgore and he was agreeable.   Nelida Meuse III Office:[phone]

## 2021-11-17 NOTE — Progress Notes (Signed)
Modified Barium Swallow Progress Note  Patient Details  Name: Mark Thomas MRN: [id number] Date of Birth: 27-Jan-1951  Today's Date: 11/17/2021  Modified Barium Swallow completed.  Full report located under Chart Review in the Imaging Section.  Brief recommendations include the following:  Clinical Impression  Pt demosntrates normal oropharyngeal swallowing function. Risk of prandial aspiration is low. Esophageal sweep with barium tablet showed still, full esophageal column of barium, tablet at distal esopahgus, did not pass the GE junction. No radiologist present to confirm. Will suggest esophageal f/u to MD. SLP to sign off.   Swallow Evaluation Recommendations   Recommended Consults: Consider esophageal assessment   SLP Diet Recommendations: Regular solids;Thin liquid   Liquid Administration via: Cup;Straw   Medication Administration: Crushed with puree   Supervision: Patient able to self feed   Compensations: Slow rate;Small sips/bites;Minimize environmental distractions   Postural Changes: Remain semi-upright after after feeds/meals (Comment)   Oral Care Recommendations: Oral care BID        Mark Thomas, Mark Thomas 11/17/2021,9:51 AM

## 2021-11-17 NOTE — Progress Notes (Signed)
NAME:  Mark Thomas, MRN:  [id number], DOB:  30-Apr-1951, LOS: 2 ADMISSION DATE:  11/20/2021, CONSULTATION DATE:  11/17/2021  REFERRING MD:  Waldron Labs, TRH, CHIEF COMPLAINT: Recurrent pneumonia  History of Present Illness:  71 year old never smoker that we are asked to evaluate for recurrent pneumonia. He was initially seen as a pulmonary consult 09/15/2018 for ILD and recurrent pneumonia.  He subsequently developed left hemi- opacity of his lung and was hospitalized multiple times.  He was treated with prolonged and multiple courses of IV antibiotics.  He required oxygen and was subsequently weaned off.  He is now a resident of [facility] for the last 6 weeks.  Bronchoscopy on 6/7 with BAL was negative for AFB, fungal and bacterial cultures. Swallowing evaluation 07/2021 showed mild aspiration risk and mild dysphagia Was last hospitalization for 6/20 to 6/27 for recurrent pneumonia with a component of acute diastolic heart failure.  He was diuresed, treated with cefepime and steroids and discharged on 3 L of oxygen. He developed shortness of breath, right-sided chest pain and hypoxia for the last 4 days prior to this admission.  He was started on ceftriaxone and doxycycline.  He was also on Lasix and prednisone .  Chest x-ray 7/13 showed right more than left infiltrate He was seen by our nurse practitioner on 7/14 for an office visit.  Chest x-ray 7/14 showed asymmetric airspace disease in the right mid and lower lung He developed subjective fevers and cough and sought ED attention on 7/17 Labs showed leukocytosis 20 K increased from 12, creatinine 1.5, BNP 299, lactate was 2.9 and 2.5 Chest x-ray showed worsening infiltrates on the right in a patchy pattern consistent with bronchopneumonia.  He was started on cefepime and vancomycin and PCCM consulted Pertinent  Medical History  Atrial fibrillation status post Watchman device CAD status post stent, on Plavix Sick sinus syndrome status post  pacemaker RLS, essential tremor HFpEF Chronic respiratory failure with hypoxia CKD stage III  Significant Hospital Events: Including procedures, antibiotic start and stop dates in addition to other pertinent events   CT chest without con 06/2021 patchy groundglass nodules in right upper lobe CTA chest 07/2021 mild peripheral interstitial opacities in lower lung?  ILD, patchy groundglass opacities in bilateral upper lobes. CT chest without con 10/2021 left lung opacities have improved, worsening patchy groundglass opacity throughout the right lung, trace effusions , distal esophageal wall thickening with moderate hiatal hernia. Swallow evaluation/MBS 07/2021 mild aspiration risk, no restriction 7/19 Modified barium swallow. Normal swallowing function. Barium was retained in esophagus and did not pas GE junction.   Interim History / Subjective:  Constant tearing chest pain all over anterior chest. No aggravating or alleviating factors. Unchanged for days-weeks. Improved with percocet.   Objective   Blood pressure 120/68, pulse 79, temperature 98.7 F (37.1 C), temperature source Oral, resp. rate 15, height _0  (1.854 m), weight 108.2 kg, SpO2 99 %.        Intake/Output Summary (Last 24 hours) at 11/17/2021 1022 Last data filed at 11/17/2021 0556 Gross per 24 hour  Intake 50 ml  Output 550 ml  Net -500 ml    Filed Weights   11/14/2021 1558 11/16/21 1524  Weight: 105.2 kg 108.2 kg    Examination: General: Overweight elderly appearing male in NAD HENT: [place]/AT, PERRL, no JVD Lungs: Clear bilateral breath sounds.  Cardiovascular: RRR Abdomen: Soft, non-tender, non-distended Extremities: No acute deformity or ROM limitation. Normal bulk and tone.  Neuro: Alert, oriented, non-focal  Resolved Hospital Problem list     Assessment & Plan:   Recurrent pneumonia.  He has an extensive course over the last 3 to 4 months with progressive left white out of his left lung which is now  resolved.  He is now developing extensive infiltrates on the right. CT now showing patchy and ground glass infiltrates on the R raising concern for COP or other inflammatory condition.   Bronchoscopy 09/2021 was nondiagnostic for infectious etiology and BAL did not show eosinophils. Differential in the setting includes mainly aspiration pneumonia and possibly COP/other inflammatory pneumonia.  Eosinophilic pneumonia seems unlikely given lack of eosinophils on BAL and slower response to steroids.  Repeat bronchoscopy would be low yield and since he is on Plavix, transbronchial biopsy would be contraindicated  Now 7/19 modified barium with poor progression of barium into the stomach raising concern for aspiration. Swallowing seemed OK despite patient report of frequently getting choked on foods of varying textures.   - Consider GI consult to evaluate GE sphincter - Empiric antibiotics per primary - Solu-Medrol 40 every 24 with plan for prolonged taper.   Oral thrush -IV Diflucan, possible that he has more extensive thrush in his GI tract -Given findings of esophageal thickening and reflux esophagitis/moderate lateral hernia, would suggest GI evaluation and possible EGD since no other cause identified   Georgann Housekeeper, AGACNP-BC [facility]  See Amion for personal pager PCCM on call pager [phone] until 7pm. Please call Elink 7p-7a. [phone]  11/17/2021 10:34 AM

## 2021-11-18 DIAGNOSIS — J189 Pneumonia, unspecified organism: Secondary | ICD-10-CM | POA: Diagnosis not present

## 2021-11-18 DIAGNOSIS — N179 Acute kidney failure, unspecified: Secondary | ICD-10-CM | POA: Diagnosis not present

## 2021-11-18 DIAGNOSIS — I2510 Atherosclerotic heart disease of native coronary artery without angina pectoris: Secondary | ICD-10-CM | POA: Diagnosis not present

## 2021-11-18 DIAGNOSIS — K219 Gastro-esophageal reflux disease without esophagitis: Secondary | ICD-10-CM | POA: Diagnosis not present

## 2021-11-18 LAB — CBC
HCT: 31.4 % — ABNORMAL LOW (ref 39.0–52.0)
Hemoglobin: 10.2 g/dL — ABNORMAL LOW (ref 13.0–17.0)
MCH: 31.6 pg (ref 26.0–34.0)
MCHC: 32.5 g/dL (ref 30.0–36.0)
MCV: 97.2 fL (ref 80.0–100.0)
Platelets: 145 10*3/uL — ABNORMAL LOW (ref 150–400)
RBC: 3.23 MIL/uL — ABNORMAL LOW (ref 4.22–5.81)
RDW: 16 % — ABNORMAL HIGH (ref 11.5–15.5)
WBC: 12.3 10*3/uL — ABNORMAL HIGH (ref 4.0–10.5)
nRBC: 0 % (ref 0.0–0.2)

## 2021-11-18 LAB — COMPREHENSIVE METABOLIC PANEL
ALT: 32 U/L (ref 0–44)
AST: 23 U/L (ref 15–41)
Albumin: 2.3 g/dL — ABNORMAL LOW (ref 3.5–5.0)
Alkaline Phosphatase: 49 U/L (ref 38–126)
Anion gap: 10 (ref 5–15)
BUN: 15 mg/dL (ref 8–23)
CO2: 26 mmol/L (ref 22–32)
Calcium: 9.1 mg/dL (ref 8.9–10.3)
Chloride: 103 mmol/L (ref 98–111)
Creatinine, Ser: 0.92 mg/dL (ref 0.61–1.24)
GFR, Estimated: >60 mL/min (ref >60)
Glucose, Bld: 120 mg/dL — ABNORMAL HIGH (ref 70–99)
Potassium: 4.4 mmol/L (ref 3.5–5.1)
Sodium: 139 mmol/L (ref 135–145)
Total Bilirubin: 0.9 mg/dL (ref 0.3–1.2)
Total Protein: 5.9 g/dL — ABNORMAL LOW (ref 6.5–8.1)

## 2021-11-18 LAB — ACID FAST CULTURE WITH REFLEXED SENSITIVITIES (MYCOBACTERIA): Acid Fast Culture: NEGATIVE

## 2021-11-18 MED ORDER — HYDROCOD POLI-CHLORPHE POLI ER 10-8 MG/5ML PO SUER
5.0000 mL | Freq: Once | ORAL | Status: AC
  Administered 2021-11-18: 5 mL via ORAL
  Filled 2021-11-18: qty 5

## 2021-11-18 MED ORDER — GUAIFENESIN-DM 100-10 MG/5ML PO SYRP
5.0000 mL | ORAL_SOLUTION | ORAL | Status: DC | PRN
  Administered 2021-11-18 – 2021-11-19 (×4): 5 mL via ORAL
  Filled 2021-11-18 (×4): qty 5

## 2021-11-18 MED ORDER — PREDNISONE 20 MG PO TABS
20.0000 mg | ORAL_TABLET | Freq: Every day | ORAL | Status: AC
  Administered 2021-11-19: 20 mg via ORAL
  Filled 2021-11-18: qty 1

## 2021-11-18 NOTE — Care Management Important Message (Signed)
Important Message  Patient Details  Name: Mark Thomas MRN: [id number] Date of Birth: [birth year]   Medicare Important Message Given:  Yes     [name] 11/18/2021, 2:46 PM

## 2021-11-18 NOTE — Progress Notes (Signed)
NAME:  Mark Thomas, MRN:  [id number], DOB:  1950-06-13, LOS: 3 ADMISSION DATE:  11/14/2021, CONSULTATION DATE:  11/18/2021  REFERRING MD:  Waldron Labs, TRH, CHIEF COMPLAINT: Recurrent pneumonia  History of Present Illness:  71 year old never smoker that we are asked to evaluate for recurrent pneumonia. He was initially seen as a pulmonary consult 09/15/2018 for ILD and recurrent pneumonia.  He subsequently developed left hemi- opacity of his lung and was hospitalized multiple times.  He was treated with prolonged and multiple courses of IV antibiotics.  He required oxygen and was subsequently weaned off.  He is now a resident of [facility] for the last 6 weeks.  Bronchoscopy on 6/7 with BAL was negative for AFB, fungal and bacterial cultures. Swallowing evaluation 07/2021 showed mild aspiration risk and mild dysphagia  Was last hospitalization for 6/20 to 6/27 for recurrent pneumonia with a component of acute diastolic heart failure.  He was diuresed, treated with cefepime and steroids and discharged on 3 L of oxygen. He developed shortness of breath, right-sided chest pain and hypoxia for the last 4 days prior to this admission.  He was started on ceftriaxone and doxycycline.  He was also on Lasix and prednisone .  Chest x-ray 7/13 showed right more than left infiltrate. He was seen by our nurse practitioner on 7/14 for an office visit.  Chest x-ray 7/14 showed asymmetric airspace disease in the right mid and lower lung  He developed subjective fevers and cough and sought ED attention on 7/17 Labs showed leukocytosis 20 K increased from 12, creatinine 1.5, BNP 299, lactate was 2.9 and 2.5. Chest x-ray showed worsening infiltrates on the right in a patchy pattern consistent with bronchopneumonia.  He was started on cefepime and vancomycin and PCCM consulted  Pertinent  Medical History  Atrial fibrillation status post Watchman device CAD status post stent, on Plavix Sick sinus syndrome status post  pacemaker RLS, essential tremor HFpEF Chronic respiratory failure with hypoxia CKD stage III  Significant Hospital Events: Including procedures, antibiotic start and stop dates in addition to other pertinent events   CT chest without con 06/2021 patchy groundglass nodules in right upper lobe CTA chest 07/2021 mild peripheral interstitial opacities in lower lung?  ILD, patchy groundglass opacities in bilateral upper lobes. CT chest without con 10/2021 left lung opacities have improved, worsening patchy groundglass opacity throughout the right lung, trace effusions , distal esophageal wall thickening with moderate hiatal hernia. Swallow evaluation/MBS 07/2021 mild aspiration risk, no restriction 7/19 Modified barium swallow. Normal swallowing function. Barium was retained in esophagus and did not pas GE junction.   Interim History / Subjective:  Constant tearing chest pain all over anterior chest. No aggravating or alleviating factors. Unchanged for days-weeks. Improved with percocet.   Objective   Blood pressure 130/88, pulse 88, temperature 97.8 F (36.6 C), temperature source Oral, resp. rate 17, height 6' 1" (1.854 m), weight 107.7 kg, SpO2 92 %.        Intake/Output Summary (Last 24 hours) at 11/18/2021 7096 Last data filed at 11/17/2021 2117 Gross per 24 hour  Intake 120 ml  Output --  Net 120 ml   Filed Weights   11/02/2021 1558 11/16/21 1524 11/18/21 0500  Weight: 105.2 kg 108.2 kg 107.7 kg    Examination: General: Overweight elderly appearing male in NAD HENT: [place]/AT, PERRL, no JVD Lungs: Clear bilateral breath sounds.  Cardiovascular: RRR Abdomen: Soft, non-tender, non-distended Extremities: No acute deformity or ROM limitation. Normal bulk and tone.  Neuro:  Alert, oriented, non-focal   Resolved Hospital Problem list     Assessment & Plan:   Recurrent pneumonia.   He has an extensive course over the last 3 to 4 months with progressive left white out of his left lung  which is now resolved.  He is now developing extensive infiltrates on the right. CT now showing patchy and ground glass infiltrates on the R raising concern for COP or other inflammatory condition.   Bronchoscopy 09/2021 was nondiagnostic for infectious etiology and BAL did not show eosinophils. - Differential in the setting includes mainly aspiration pneumonia and possibly COP/other inflammatory pneumonia.  Eosinophilic pneumonia seems unlikely given lack of eosinophils on BAL and slower response to steroids.  Repeat bronchoscopy would be low yield and since he is on Plavix, transbronchial biopsy would be contraindicated  - MBSS with no movement of pill through GE junction  I suspect he has esophageal stricture or something that is slowing motility. He goes to sleep after eating and is silent aspirating into his airways   P: Complete short course abx  VERY STRICT aspiration precautions I recommended he not eat another 1 hour prior to bed  Plavix holding for EDG per GI  Appreciate their recs  Wean off steroids, 37m pred X 3 more days then stop I think autoimmue work up is low yield. If GI eval shows nothing could consider outpatient.  Dr. DErin Fullingpulm outpatient follow up   Pulm will be available as needed. Patient on plavix wash out for egd. Call [place] we need to see him. Otherwise sign off at this time.    Oral thrush -IV Diflucan, possible that he has more extensive thrush in his GI tract - improved  - counseled on rinsing mouth    BGarner Nash DO [facility] 11/18/2021 9:52 AM

## 2021-11-18 NOTE — Progress Notes (Addendum)
PROGRESS NOTE        PATIENT DETAILS Name: Mark Thomas Age: 71 y.o. Sex: male Date of Birth: [birth year] Admit Date: 11/05/2021 Admitting Physician Marcelyn Bruins, MD [id number], Einar Pheasant, DO  Brief Summary: Patient is a 71 y.o.  male with history of atrial fibrillation-s/p watchman's device, CAD s/p PCI, CKD stage IIIa, chronic HFpEF, orthostatic hypotension, dementia-who has had numerous hospitalizations for recurrent pneumonia-presented to the ED on 7/17 with worsening shortness of breath and cough-found to have recurrent PNA and admitted to the hospitalist service.   Significant events: 4/05-4/10>> hospitalization for PNA 4/27-4/30>> hospitalization for sepsis/hypoxia due to PNA. 6/04 6/15>> hospitalization for severe hypoxia requiring HFNC for PNA.  Discharged on long tapering steroids. 6/20-6/27>> hospitalization for hypoxia due to recurrent PNA 7/17>> admit to [facility]-recurrent PNA  Significant studies: 7/17>> CXR: Worsening patchy right-sided opacities 7/17>> CT chest: Worsening patchy opacities in the right lung-overall improvement in left lung opacities. 7/20>> barium esophagogram: Marked narrowed appearance of distal esophagus  Significant microbiology data: 6/07>> BAL culture: Negative  6/07>> BAL AFB smear: Negative 6/07>> BAL AFB culture: Pending 6/07>> BAL fungal culture: Negative 6/07>> BAL cytology: No malignant cells identified 7/17>> COVID PCR: Negative 7/17>> blood culture: Negative  Procedures: 6/07>> bronchoscopy  Consults: PCCM  Subjective: Complains of cough- otherwise comfortable.  Objective: Vitals: Blood pressure 124/76, pulse 86, temperature 98 F (36.7 C), temperature source Oral, resp. rate (!) 25, height _0  (1.854 m), weight 107.7 kg, SpO2 100 %.   Exam: Gen Exam:Alert awake-not in any distress HEENT:atraumatic, normocephalic Chest: B/L clear to auscultation anteriorly CVS:S1S2 regular Abdomen:soft non  tender, non distended Extremities:no edema Neurology: Non focal Skin: no rash   Pertinent Labs/Radiology:    Latest Ref Rng & Units 11/18/2021    5:08 AM 11/17/2021    4:41 AM 11/16/2021    4:13 AM  CBC  WBC 4.0 - 10.5 K/uL 12.3  11.4  11.3   Hemoglobin 13.0 - 17.0 g/dL 10.2  9.8  9.8   Hematocrit 39.0 - 52.0 % 31.4  30.3  31.5   Platelets 150 - 400 K/uL 145  146  120     Lab Results  Component Value Date   NA 139 11/18/2021   K 4.4 11/18/2021   CL 103 11/18/2021   CO2 26 11/18/2021      Assessment/Plan: Recurrent pneumonia: Has had numerous hospitalizations for recurrent pneumonia-given barium esophagogram findings of a possible distal esophageal stricture-highly likely this is due to aspiration.  Plan is to wean off steroids-continue IV antibiotics-maintain strict aspiration precautions.  Acute on chronic hypoxic respiratory failure: Due to above-continue to attempt to titrate off oxygen.  Has been on home O2 since his most recent hospitalization.  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  Continue PPI-patient/RN aware of need for strict aspiration precautions.  AKI on CKD stage IIIa: AKI hemodynamically mediated-back to baseline.    History of interstitial lung disease: Suspected GERD contributing to his ILD.  Supportive care-on PPI.   Chronic HFpEF: Volume status stable-do not think he requires any further diuretics at this point.     History SVT: Continue beta-blocker-evaluated by cardiology in his prior admits-not felt to have A-fib-continue beta-blocker.     History of PAF-s/p watchman's device 03/2019 and RFA with isolation of all 4 pulmonary veins in  2021: Maintaining sinus rhythm-Per spouse-they have been notified by outpatient rhythm management group that he has been having atrial fibrillation lately.  Discussed with cardiology-Dr.Acharya on 7/19-then discussed with EP-since he  has no strokelike symptoms-recommendations were to have patient follow-up with his outpatient EP physician. Since he has a watchman's device in place-no longer on anticoagulation.     History of CAD s/p multiple PCI's: Currently without any anginal symptoms.  Continue aspirin/statin/beta-blocker.  Plavix on hold for EGD next week.   History of ASD-s/p closure 01/22/2020   History of moderate aortic stenosis: Stable for outpatient follow-up with cardiology.  History of CVA: No focal deficits.  Continue antiplatelets-statin.   History of sick sinus syndrome-s/p PPM placement: Continue telemetry monitoring.   History of vascular dementia: Appears to be mild-continue Namenda//Seroquel.    History of anxiety/depression: Continue Lexapro  History of RLS: Continue Requip   History of orthostatic hypotension: Continue midodrine.     GERD: Continue PPI  Chronic pain syndrome: Continue home regimen of narcotics.  Obesity: Estimated body mass index is 31.33 kg/m as calculated from the following:   Height as of this encounter: _0  (1.854 m).   Weight as of this encounter: 107.7 kg.   Code status:   Code Status: Full Code   DVT Prophylaxis: enoxaparin (LOVENOX) injection 40 mg Start: 11/19/2021 2200   Family Communication: Spouse [phone] updated on 7/20  Disposition Plan: Status is: Inpatient Remains inpatient appropriate because: Recurrent PNA-hypoxia-noted stable for discharge.  Work-up underway   Planned Discharge Destination:Home   Diet: Diet Order             Diet Heart Room service appropriate? Yes; Fluid consistency: Thin  Diet effective now                     Antimicrobial agents: Anti-infectives (From admission, onward)    Start     Dose/Rate Route Frequency Ordered Stop   11/17/21 1600  Ampicillin-Sulbactam (UNASYN) 3 g in sodium chloride 0.9 % 100 mL IVPB        3 g 200 mL/hr over 30 Minutes Intravenous Every 6 hours 11/17/21 1431      11/16/21 2000  vancomycin (VANCOREADY) IVPB 1250 mg/250 mL  Status:  Discontinued        1,250 mg 166.7 mL/hr over 90 Minutes Intravenous Every 24 hours 11/05/2021 1842 11/17/21 1404   11/16/21 1230  fluconazole (DIFLUCAN) IVPB 100 mg        100 mg 50 mL/hr over 60 Minutes Intravenous Every 24 hours 11/16/21 1111     11/16/21 1000  ceFEPIme (MAXIPIME) 2 g in sodium chloride 0.9 % 100 mL IVPB  Status:  Discontinued        2 g 200 mL/hr over 30 Minutes Intravenous Every 12 hours 11/04/2021 1912 11/17/21 1404   11/06/2021 1630  vancomycin (VANCOREADY) IVPB 2000 mg/400 mL        2,000 mg 200 mL/hr over 120 Minutes Intravenous  Once 11/26/2021 1627 11/29/2021 2029   11/06/2021 1615  ceFEPIme (MAXIPIME) 2 g in sodium chloride 0.9 % 100 mL IVPB        2 g 200 mL/hr over 30 Minutes Intravenous  Once 11/21/2021 1609 11/05/2021 1850        MEDICATIONS: Scheduled Meds:  allopurinol  100 mg Oral Daily   aspirin EC  81 mg Oral Daily   atorvastatin  80 mg Oral Daily   enoxaparin (LOVENOX) injection  40 mg Subcutaneous Q24H   escitalopram  20 mg Oral Daily   ipratropium-albuterol  3 mL Nebulization BID   latanoprost  1 drop Both Eyes QHS   memantine  10 mg Oral BID   metoprolol tartrate  37.5 mg Oral BID   midodrine  2.5 mg Oral TID WC   mometasone-formoterol  2 puff Inhalation BID   oxyCODONE-acetaminophen  1-2 tablet Oral Q8H   pantoprazole  40 mg Oral QPM   [START ON 11/19/2021] predniSONE  20 mg Oral Q breakfast   primidone  100 mg Oral BID   QUEtiapine  100 mg Oral QHS   ranolazine  1,000 mg Oral BID   rOPINIRole  4 mg Oral QHS   saccharomyces boulardii  250 mg Oral BID   sodium chloride flush  3 mL Intravenous Q12H   Continuous Infusions:  ampicillin-sulbactam (UNASYN) IV 3 g (11/18/21 0945)   fluconazole (DIFLUCAN) IV 100 mg (11/18/21 1229)   PRN Meds:.acetaminophen **OR** acetaminophen, albuterol, guaiFENesin-dextromethorphan, LORazepam, polyethylene glycol   I have personally reviewed  following labs and imaging studies  LABORATORY DATA: CBC: Recent Labs  Lab 11/10/2021 1519 11/16/21 0413 11/17/21 0441 11/18/21 0508  WBC 20.0* 11.3* 11.4* 12.3*  NEUTROABS 17.9*  --   --   --   HGB 12.0* 9.8* 9.8* 10.2*  HCT 39.4 31.5* 30.3* 31.4*  MCV 102.1* 100.0 99.0 97.2  PLT 198 120* 146* 145*     Basic Metabolic Panel: Recent Labs  Lab 11/07/2021 1519 11/16/21 0413 11/17/21 0441 11/18/21 0508  NA 135 137 138 139  K 4.3 3.8 4.5 4.4  CL 98 103 104 103  CO2 _0 GLUCOSE 201* 102* 147* 120*  BUN _1 CREATININE 1.53* 1.12 1.03 0.92  CALCIUM 9.1 8.6* 8.8* 9.1     GFR: Estimated Creatinine Clearance: 98.9 mL/min (by C-G formula based on SCr of 0.92 mg/dL).  Liver Function Tests: Recent Labs  Lab 10/31/2021 1519 11/16/21 0413 11/18/21 0508  AST 45* 23 23  ALT 45* 31 32  ALKPHOS 56 43 49  BILITOT 0.7 0.5 0.9  PROT 7.1 5.7* 5.9*  ALBUMIN 2.9* 2.3* 2.3*    No results for input(s): "LIPASE", "AMYLASE" in the last 168 hours. No results for input(s): "AMMONIA" in the last 168 hours.  Coagulation Profile: Recent Labs  Lab 11/22/2021 1519  INR 1.0     Cardiac Enzymes: No results for input(s): "CKTOTAL", "CKMB", "CKMBINDEX", "TROPONINI" in the last 168 hours.  BNP (last 3 results) Recent Labs    03/18/21 1004  PROBNP 370     Lipid Profile: No results for input(s): "CHOL", "HDL", "LDLCALC", "TRIG", "CHOLHDL", "LDLDIRECT" in the last 72 hours.  Thyroid Function Tests: No results for input(s): "TSH", "T4TOTAL", "FREET4", "T3FREE", "THYROIDAB" in the last 72 hours.  Anemia Panel: No results for input(s): "VITAMINB12", "FOLATE", "FERRITIN", "TIBC", "IRON", "RETICCTPCT" in the last 72 hours.  Urine analysis:    Component Value Date/Time   COLORURINE AMBER (A) 10/07/2021 2003   APPEARANCEUR CLEAR 10/07/2021 2003   LABSPEC 1.021 10/07/2021 2003   PHURINE 5.0 10/07/2021 2003   GLUCOSEU NEGATIVE 10/07/2021 2003   HGBUR NEGATIVE  10/07/2021 2003   [place] NEGATIVE 10/07/2021 2003   [place] NEGATIVE 10/07/2021 2003   PROTEINUR 30 (A) 10/07/2021 2003   NITRITE NEGATIVE 10/07/2021 2003   LEUKOCYTESUR NEGATIVE 10/07/2021 2003    Sepsis Labs: Lactic Acid, Venous    Component Value Date/Time   LATICACIDVEN 2.5 (HH) 11/12/2021 1737    MICROBIOLOGY: Recent Results (from the  past 240 hour(s))  Blood Culture (routine x 2)     Status: None (Preliminary result)   Collection Time: 11/18/2021  3:54 PM   Specimen: BLOOD  Result Value Ref Range Status   Specimen Description BLOOD LEFT ANTECUBITAL  Final   Special Requests   Final    BOTTLES DRAWN AEROBIC AND ANAEROBIC Blood Culture adequate volume   Culture   Final    NO GROWTH 3 DAYS Performed at [facility], [address] [place], [place] [address]    Report Status PENDING  Incomplete  MRSA Next Gen by PCR, Nasal     Status: None   Collection Time: 11/16/2021  4:27 PM   Specimen: Nasal Mucosa; Nasal Swab  Result Value Ref Range Status   MRSA by PCR Next Gen NOT DETECTED NOT DETECTED Final    Comment: (NOTE) The GeneXpert MRSA Assay (FDA approved for NASAL specimens only), is one component of a comprehensive MRSA colonization surveillance program. It is not intended to diagnose MRSA infection nor to guide or monitor treatment for MRSA infections. Test performance is not FDA approved in patients less than 39 years old. Performed at [facility], [place] [address] [place], [place] [address]   SARS Coronavirus 2 by RT PCR (hospital order, performed in [facility] hospital lab) *cepheid single result test* Anterior Nasal Swab     Status: None   Collection Time: 11/24/2021  7:42 PM   Specimen: Anterior Nasal Swab  Result Value Ref Range Status   SARS Coronavirus 2 by RT PCR NEGATIVE NEGATIVE Final    Comment: (NOTE) SARS-CoV-2 target nucleic acids are NOT DETECTED.  The SARS-CoV-2 RNA is generally detectable in upper and lower respiratory  specimens during the acute phase of infection. The lowest concentration of SARS-CoV-2 viral copies this assay can detect is 250 copies / mL. A negative result does not preclude SARS-CoV-2 infection and should not be used as the sole basis for treatment or other patient management decisions.  A negative result may occur with improper specimen collection / handling, submission of specimen other than nasopharyngeal swab, presence of viral mutation(s) within the areas targeted by this assay, and inadequate number of viral copies (<250 copies / mL). A negative result must be combined with clinical observations, patient history, and epidemiological information.  Fact Sheet for Patients:   https://www.patel.info/  Fact Sheet for Healthcare Providers: https://hall.com/  This test is not yet approved or  cleared by the [place] FDA and has been authorized for detection and/or diagnosis of SARS-CoV-2 by FDA under an Emergency Use Authorization (EUA).  This EUA will remain in effect (meaning this test can be used) for the duration of the COVID-19 declaration under Section 564(b)(1) of the Act, 21 U.S.C. section 360bbb-3(b)(1), unless the authorization is terminated or revoked sooner.  Performed at [facility], [place] [address], [place], [place] [address]     RADIOLOGY STUDIES/RESULTS: DG ESOPHAGUS W SINGLE CM (SOL OR THIN BA)  Result Date: 11/17/2021 CLINICAL DATA:  Dementia, recurrent pneumonia, concern for aspiration. Barium tablet did not move beyond the GE junction on same day modified barium swallow. EXAM: ESOPHAGUS/BARIUM SWALLOW/TABLET STUDY TECHNIQUE: A single contrast esophagram was performed using thin liquid barium. Additionally, the patient swallowed a 13 mm barium tablet under fluoroscopy. The exam was performed by Candiss Norse, PA-C, and was supervised and interpreted by Kellie Simmering, D.O. FLUOROSCOPY: Fluoroscopy time: 2  minutes, 54 seconds (69.30 mGy). COMPARISON:  Modified barium swallow 11/17/2021. Chest  CT 11/13/2021. FINDINGS: Limited examination due to the patient's inability to stand and limited ability to reposition on the fluoroscopy table. Markedly narrowed appearance of the distal esophagus/GE junction. A swallowed 13 mm barium tablet not pass beyond this point despite a prolonged period of observation. The esophagus is otherwise appears normal in caliber and smooth in contour. Esophageal dysmotility with decreased peristalsis. Moderate-sized hiatal hernia. No gastroesophageal reflux was observed. IMPRESSION: Limited examination due to the patient's inability to stand and limited ability to reposition on the fluoroscopy table. Markedly narrowed appearance of the distal esophagus/GE junction. A swallowed 13 mm barium tablet did not pass beyond this point despite a prolonged period of observation. These findings likely reflect a high-grade distal esophageal stricture and endoscopy should be considered for further evaluation. Esophageal dysmotility with decreased peristalsis. Moderate size hiatal hernia. Electronically Signed   By: Kellie Simmering D.O.   On: 11/17/2021 14:20   DG Swallowing Func-Speech Pathology  Result Date: 11/17/2021 Table formatting from the original result was not included. Objective Swallowing Evaluation: Type of Study: MBS-Modified Barium Swallow Study  Patient Details Name: LIEM COPENHAVER MRN: [id number] Date of Birth: [birth year] Today's Date: 11/17/2021 Time: SLP Start Time (ACUTE ONLY): 0930 -SLP Stop Time (ACUTE ONLY): 1945 SLP Time Calculation (min) (ACUTE ONLY): 615 min Past Medical History: Past Medical History: Diagnosis Date  Anxiety   Coronary artery disease   Depression   Essential tremor   GERD (gastroesophageal reflux disease)   High cholesterol   Hypertension   Myocardial infarct (HCC)   Orthostatic hypotension   Stroke [facility])   Stroke ([place]) 02/02/2021  Vascular dementia ([place])  Past  Surgical History: Past Surgical History: Procedure Laterality Date  [place] N/A 10/06/2021  Procedure: FLEXIBLE BRONCHOSCOPY;  Surgeon: Margaretha Seeds, MD;  Location: [facility];  Service: Cardiopulmonary;  Laterality: N/A;  HERNIA REPAIR    NASAL SINUS SURGERY    SHOULDER SURGERY   HPI: Patient is a 71 y.o. male with PMH: vascular dementia, CAD s/p stent, CVA, CKD 3, HTN, paroxysmal atrial fibrillation, dysphagia. He presented to the ED on 10/04/21 with worsening SOB and cough. He has been having progressive SOB since having Covid infection 04/2021 and has been followed by pulmonology on an OP basis with some improvement in cough. Pulmonology suspect that his symptoms are multi-factorial with ongoing GERD, mild interstitial lung disease pattern seen on CXR. He had an MBS 08/27/21 which did not show any penetration or aspiration of any of the tested consistencies (thin liquid, puree solids, 14m barium tablet,, regular solids). CXR completed on 10/03/21 showed Increased heterogeneous opacities in the left lung, now involving the left upper lung, concerning for worsening pneumonia. Bronchoscopy completed on 10/07/21 which reported that examination of the airway was normal.  Subjective: pleasant, some confusion (wife reports his vascular dementia symptoms are worse this morning), wife in room  Recommendations for follow up therapy are one component of a multi-disciplinary discharge planning process, led by the attending physician.  Recommendations may be updated based on patient status, additional functional criteria and insurance authorization. Assessment / Plan / Recommendation   11/17/2021   9:00 AM Clinical Impressions Clinical Impression Pt demosntrates normal oropharyngeal swallowing function. Risk of prandial aspiration is low. Esophageal sweep with barium tablet showed still, full esophageal column of barium, tablet at distal esophagus, did not pass the  GE junction. No radiologist present to confirm. Will suggest esophageal f/u to MD.  SLP to sign off. SLP Visit Diagnosis Dysphagia, unspecified (R13.10) Impact on safety and function No limitations;Mild aspiration risk     11/17/2021   9:00 AM Treatment Recommendations Treatment Recommendations No treatment recommended at this time     10/07/2021  11:59 AM Prognosis Prognosis for Safe Diet Advancement Good   11/17/2021   9:00 AM Diet Recommendations SLP Diet Recommendations Regular solids;Thin liquid Liquid Administration via Cup;Straw Medication Administration Crushed with puree Compensations Slow rate;Small sips/bites;Minimize environmental distractions Postural Changes Remain semi-upright after after feeds/meals (Comment)     11/17/2021   9:00 AM Other Recommendations Recommended Consults Consider esophageal assessment Oral Care Recommendations Oral care BID   08/26/2021   5:37 PM Frequency and Duration  Speech Therapy Frequency (ACUTE ONLY) min 1 x/week Treatment Duration 1 week     11/17/2021   9:00 AM Oral Phase Oral Phase [facility]    11/17/2021   9:00 AM Pharyngeal Phase Pharyngeal Phase [facility]    08/27/2021   8:39 AM Cervical Esophageal Phase  Cervical Esophageal Phase [facility], Katherene Ponto 11/17/2021, 9:52 AM                       LOS: 3 days   Oren Binet, MD  [facility]    To contact the attending provider between 7A-7P or the covering provider during after hours 7P-7A, please log into the web site [email or web address] and access using universal [place] password for that web site. If you do not have the password, please call the hospital operator.  11/18/2021, 1:00 PM

## 2021-11-19 ENCOUNTER — Encounter (HOSPITAL_COMMUNITY): Payer: Self-pay | Admitting: Internal Medicine

## 2021-11-19 DIAGNOSIS — J189 Pneumonia, unspecified organism: Secondary | ICD-10-CM | POA: Diagnosis not present

## 2021-11-19 DIAGNOSIS — I2510 Atherosclerotic heart disease of native coronary artery without angina pectoris: Secondary | ICD-10-CM | POA: Diagnosis not present

## 2021-11-19 DIAGNOSIS — N179 Acute kidney failure, unspecified: Secondary | ICD-10-CM | POA: Diagnosis not present

## 2021-11-19 DIAGNOSIS — K219 Gastro-esophageal reflux disease without esophagitis: Secondary | ICD-10-CM | POA: Diagnosis not present

## 2021-11-19 LAB — BASIC METABOLIC PANEL
Anion gap: 10 (ref 5–15)
BUN: 18 mg/dL (ref 8–23)
CO2: 28 mmol/L (ref 22–32)
Calcium: 9.2 mg/dL (ref 8.9–10.3)
Chloride: 103 mmol/L (ref 98–111)
Creatinine, Ser: 0.98 mg/dL (ref 0.61–1.24)
GFR, Estimated: >60 mL/min (ref >60)
Glucose, Bld: 114 mg/dL — ABNORMAL HIGH (ref 70–99)
Potassium: 4.6 mmol/L (ref 3.5–5.1)
Sodium: 141 mmol/L (ref 135–145)

## 2021-11-19 MED ORDER — ORAL CARE MOUTH RINSE: 15.0000 mL | OROMUCOSAL | Status: DC | PRN

## 2021-11-19 MED ORDER — ORAL CARE MOUTH RINSE
15.0000 mL | OROMUCOSAL | Status: DC
  Administered 2021-11-19 – 2021-11-21 (×10): 15 mL via OROMUCOSAL

## 2021-11-19 MED ORDER — BENZONATATE 100 MG PO CAPS
200.0000 mg | ORAL_CAPSULE | Freq: Three times a day (TID) | ORAL | Status: DC | PRN
  Administered 2021-11-19: 200 mg via ORAL
  Filled 2021-11-19: qty 2

## 2021-11-19 MED ORDER — OXYCODONE-ACETAMINOPHEN 5-325 MG PO TABS
1.0000 | ORAL_TABLET | Freq: Three times a day (TID) | ORAL | Status: DC
  Administered 2021-11-19 – 2021-11-24 (×7): 1 via ORAL
  Filled 2021-11-19 (×7): qty 1

## 2021-11-19 MED ORDER — HYDROCOD POLI-CHLORPHE POLI ER 10-8 MG/5ML PO SUER
5.0000 mL | Freq: Two times a day (BID) | ORAL | Status: DC | PRN
  Administered 2021-11-19 – 2021-11-27 (×2): 5 mL via ORAL
  Filled 2021-11-19 (×2): qty 5

## 2021-11-19 NOTE — Progress Notes (Signed)
PROGRESS NOTE        PATIENT DETAILS Name: Mark Thomas Age: 71 y.o. Sex: male Date of Birth: 04-01-1951 Admit Date: 11/22/2021 Admitting Physician Marcelyn Bruins, MD [id number], Einar Pheasant, DO  Brief Summary: Patient is a 71 y.o.  male with history of atrial fibrillation-s/p watchman's device, CAD s/p PCI, CKD stage IIIa, chronic HFpEF, orthostatic hypotension, dementia-who has had numerous hospitalizations for recurrent pneumonia-presented to the ED on 7/17 with worsening shortness of breath and cough-found to have recurrent PNA and admitted to the hospitalist service.   Significant events: 4/05-4/10>> hospitalization for PNA 4/27-4/30>> hospitalization for sepsis/hypoxia due to PNA. 6/04 6/15>> hospitalization for severe hypoxia requiring HFNC for PNA.  Discharged on long tapering steroids. 6/20-6/27>> hospitalization for hypoxia due to recurrent PNA 7/17>> admit to [facility]-recurrent PNA  Significant studies: 7/17>> CXR: Worsening patchy right-sided opacities 7/17>> CT chest: Worsening patchy opacities in the right lung-overall improvement in left lung opacities. 7/20>> barium esophagogram: Marked narrowed appearance of distal esophagus  Significant microbiology data: 6/07>> BAL culture: Negative  6/07>> BAL AFB smear: Negative 6/07>> BAL AFB culture: Pending 6/07>> BAL fungal culture: Negative 6/07>> BAL cytology: No malignant cells identified 7/17>> COVID PCR: Negative 7/17>> blood culture: Negative  Procedures: 6/07>> bronchoscopy  Consults: PCCM  Subjective: Complains of cough- otherwise comfortable.  Objective: Vitals: Blood pressure 102/60, pulse 91, temperature 98.2 F (36.8 C), temperature source Oral, resp. rate 20, height _0  (1.854 m), weight 106.7 kg, SpO2 93 %.   Exam: Gen Exam:Alert awake-not in any distress HEENT:atraumatic, normocephalic Chest: B/L clear to auscultation anteriorly CVS:S1S2 regular Abdomen:soft non  tender, non distended Extremities:no edema Neurology: Non focal Skin: no rash    Pertinent Labs/Radiology:    Latest Ref Rng & Units 11/18/2021    5:08 AM 11/17/2021    4:41 AM 11/16/2021    4:13 AM  CBC  WBC 4.0 - 10.5 K/uL 12.3  11.4  11.3   Hemoglobin 13.0 - 17.0 g/dL 10.2  9.8  9.8   Hematocrit 39.0 - 52.0 % 31.4  30.3  31.5   Platelets 150 - 400 K/uL 145  146  120     Lab Results  Component Value Date   NA 141 11/19/2021   K 4.6 11/19/2021   CL 103 11/19/2021   CO2 28 11/19/2021      Assessment/Plan: Recurrent pneumonia: Has had numerous hospitalizations for recurrent pneumonia-given barium esophagogram findings of a possible distal esophageal stricture-highly likely this is due to aspiration.  Appears relatively stable today on 3-4 L of oxygen.  Continue IV Unasyn-weaning off steroids-maintain strict aspiration precautions-continue O2 supplementation as needed.   Acute on chronic hypoxic respiratory failure: Due to above-continue to attempt to titrate off oxygen.  Has been on home O2 since his most recent hospitalization.  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  Continue PPI-patient/RN aware of need for strict aspiration precautions.  AKI on CKD stage IIIa: AKI hemodynamically mediated-back to baseline.    History of interstitial lung disease: Suspected GERD contributing to his ILD.  Supportive care-on PPI.   Chronic HFpEF: Volume status stable-do not think he requires any further diuretics at this point.     History SVT: Continue beta-blocker-evaluated by cardiology in his prior admits-not felt to have A-fib-continue beta-blocker.     History of  PAF-s/p watchman's device 03/2019 and RFA with isolation of all 4 pulmonary veins in 2021: Maintaining sinus rhythm-Per spouse-they have been notified by outpatient rhythm management group that he has been having atrial fibrillation  lately.  Discussed with cardiology-Dr.Acharya on 7/19-then discussed with EP-since he has no strokelike symptoms-recommendations were to have patient follow-up with his outpatient EP physician. Since he has a watchman's device in place-no longer on anticoagulation.     History of CAD s/p multiple PCI's: Currently without any anginal symptoms.  Continue aspirin/statin/beta-blocker.  Plavix on hold for EGD next week.   History of ASD-s/p closure 01/22/2020   History of moderate aortic stenosis: Stable for outpatient follow-up with cardiology.  History of CVA: No focal deficits.  Continue antiplatelets-statin.   History of sick sinus syndrome-s/p PPM placement: Continue telemetry monitoring.   History of vascular dementia: Appears to be mild-continue Namenda//Seroquel.    History of anxiety/depression: Continue Lexapro  History of RLS: Continue Requip   History of orthostatic hypotension: Continue midodrine.     GERD: Continue PPI  Chronic pain syndrome: Continue home regimen of narcotics.  Obesity: Estimated body mass index is 31.03 kg/m as calculated from the following:   Height as of this encounter: _0  (1.854 m).   Weight as of this encounter: 106.7 kg.   Code status:   Code Status: Full Code   DVT Prophylaxis: enoxaparin (LOVENOX) injection 40 mg Start: 11/28/2021 2200   Family Communication: Spouse [phone] updated on 7/20  Disposition Plan: Status is: Inpatient Remains inpatient appropriate because: Recurrent PNA-hypoxia-noted stable for discharge.  Work-up underway   Planned Discharge Destination:Home   Diet: Diet Order             Diet Heart Room service appropriate? Yes; Fluid consistency: Thin  Diet effective now                     Antimicrobial agents: Anti-infectives (From admission, onward)    Start     Dose/Rate Route Frequency Ordered Stop   11/17/21 1600  Ampicillin-Sulbactam (UNASYN) 3 g in sodium chloride 0.9 % 100 mL IVPB         3 g 200 mL/hr over 30 Minutes Intravenous Every 6 hours 11/17/21 1431     11/16/21 2000  vancomycin (VANCOREADY) IVPB 1250 mg/250 mL  Status:  Discontinued        1,250 mg 166.7 mL/hr over 90 Minutes Intravenous Every 24 hours 11/04/2021 1842 11/17/21 1404   11/16/21 1230  fluconazole (DIFLUCAN) IVPB 100 mg        100 mg 50 mL/hr over 60 Minutes Intravenous Every 24 hours 11/16/21 1111     11/16/21 1000  ceFEPIme (MAXIPIME) 2 g in sodium chloride 0.9 % 100 mL IVPB  Status:  Discontinued        2 g 200 mL/hr over 30 Minutes Intravenous Every 12 hours 11/08/2021 1912 11/17/21 1404   11/02/2021 1630  vancomycin (VANCOREADY) IVPB 2000 mg/400 mL        2,000 mg 200 mL/hr over 120 Minutes Intravenous  Once 11/09/2021 1627 11/17/2021 2029   11/19/2021 1615  ceFEPIme (MAXIPIME) 2 g in sodium chloride 0.9 % 100 mL IVPB        2 g 200 mL/hr over 30 Minutes Intravenous  Once 10/31/2021 1609 11/06/2021 1850        MEDICATIONS: Scheduled Meds:  allopurinol  100 mg Oral Daily   aspirin EC  81 mg Oral Daily   atorvastatin  80 mg Oral  Daily   enoxaparin (LOVENOX) injection  40 mg Subcutaneous Q24H   escitalopram  20 mg Oral Daily   latanoprost  1 drop Both Eyes QHS   memantine  10 mg Oral BID   metoprolol tartrate  37.5 mg Oral BID   midodrine  2.5 mg Oral TID WC   mometasone-formoterol  2 puff Inhalation BID   mouth rinse  15 mL Mouth Rinse 4 times per day   oxyCODONE-acetaminophen  1-2 tablet Oral Q8H   pantoprazole  40 mg Oral QPM   predniSONE  20 mg Oral Q breakfast   primidone  100 mg Oral BID   QUEtiapine  100 mg Oral QHS   ranolazine  1,000 mg Oral BID   rOPINIRole  4 mg Oral QHS   saccharomyces boulardii  250 mg Oral BID   sodium chloride flush  3 mL Intravenous Q12H   Continuous Infusions:  ampicillin-sulbactam (UNASYN) IV 3 g (11/19/21 0844)   fluconazole (DIFLUCAN) IV 100 mg (11/18/21 1229)   PRN Meds:.acetaminophen **OR** acetaminophen, albuterol, guaiFENesin-dextromethorphan, mouth  rinse, polyethylene glycol   I have personally reviewed following labs and imaging studies  LABORATORY DATA: CBC: Recent Labs  Lab 11/20/2021 1519 11/16/21 0413 11/17/21 0441 11/18/21 0508  WBC 20.0* 11.3* 11.4* 12.3*  NEUTROABS 17.9*  --   --   --   HGB 12.0* 9.8* 9.8* 10.2*  HCT 39.4 31.5* 30.3* 31.4*  MCV 102.1* 100.0 99.0 97.2  PLT 198 120* 146* 145*     Basic Metabolic Panel: Recent Labs  Lab 11/07/2021 1519 11/16/21 0413 11/17/21 0441 11/18/21 0508 11/19/21 0452  NA 135 137 138 139 141  K 4.3 3.8 4.5 4.4 4.6  CL 98 103 104 103 103  CO2 _0 GLUCOSE 201* 102* 147* 120* 114*  BUN _1 CREATININE 1.53* 1.12 1.03 0.92 0.98  CALCIUM 9.1 8.6* 8.8* 9.1 9.2     GFR: Estimated Creatinine Clearance: 92.4 mL/min (by C-G formula based on SCr of 0.98 mg/dL).  Liver Function Tests: Recent Labs  Lab 11/02/2021 1519 11/16/21 0413 11/18/21 0508  AST 45* 23 23  ALT 45* 31 32  ALKPHOS 56 43 49  BILITOT 0.7 0.5 0.9  PROT 7.1 5.7* 5.9*  ALBUMIN 2.9* 2.3* 2.3*    No results for input(s): "LIPASE", "AMYLASE" in the last 168 hours. No results for input(s): "AMMONIA" in the last 168 hours.  Coagulation Profile: Recent Labs  Lab 11/10/2021 1519  INR 1.0     Cardiac Enzymes: No results for input(s): "CKTOTAL", "CKMB", "CKMBINDEX", "TROPONINI" in the last 168 hours.  BNP (last 3 results) Recent Labs    03/18/21 1004  PROBNP 370     Lipid Profile: No results for input(s): "CHOL", "HDL", "LDLCALC", "TRIG", "CHOLHDL", "LDLDIRECT" in the last 72 hours.  Thyroid Function Tests: No results for input(s): "TSH", "T4TOTAL", "FREET4", "T3FREE", "THYROIDAB" in the last 72 hours.  Anemia Panel: No results for input(s): "VITAMINB12", "FOLATE", "FERRITIN", "TIBC", "IRON", "RETICCTPCT" in the last 72 hours.  Urine analysis:    Component Value Date/Time   COLORURINE AMBER (A) 10/07/2021 2003   APPEARANCEUR CLEAR 10/07/2021 2003   LABSPEC 1.021  10/07/2021 2003   PHURINE 5.0 10/07/2021 2003   GLUCOSEU NEGATIVE 10/07/2021 2003   HGBUR NEGATIVE 10/07/2021 2003   [place] NEGATIVE 10/07/2021 2003   [place] NEGATIVE 10/07/2021 2003   PROTEINUR 30 (A) 10/07/2021 2003   NITRITE NEGATIVE 10/07/2021 2003   LEUKOCYTESUR NEGATIVE 10/07/2021 2003  Sepsis Labs: Lactic Acid, Venous    Component Value Date/Time   LATICACIDVEN 2.5 (HH) 11/19/2021 1737    MICROBIOLOGY: Recent Results (from the past 240 hour(s))  Blood Culture (routine x 2)     Status: None (Preliminary result)   Collection Time: 11/16/2021  3:54 PM   Specimen: BLOOD  Result Value Ref Range Status   Specimen Description BLOOD LEFT ANTECUBITAL  Final   Special Requests   Final    BOTTLES DRAWN AEROBIC AND ANAEROBIC Blood Culture adequate volume   Culture   Final    NO GROWTH 4 DAYS Performed at [facility], [address], [place], [place] [address]    Report Status PENDING  Incomplete  MRSA Next Gen by PCR, Nasal     Status: None   Collection Time: 11/14/2021  4:27 PM   Specimen: Nasal Mucosa; Nasal Swab  Result Value Ref Range Status   MRSA by PCR Next Gen NOT DETECTED NOT DETECTED Final    Comment: (NOTE) The GeneXpert MRSA Assay (FDA approved for NASAL specimens only), is one component of a comprehensive MRSA colonization surveillance program. It is not intended to diagnose MRSA infection nor to guide or monitor treatment for MRSA infections. Test performance is not FDA approved in patients less than 23 years old. Performed at [facility], [place] [address] [place], [place] [address]   SARS Coronavirus 2 by RT PCR (hospital order, performed in [facility] hospital lab) *cepheid single result test* Anterior Nasal Swab     Status: None   Collection Time: 11/22/2021  7:42 PM   Specimen: Anterior Nasal Swab  Result Value Ref Range Status   SARS Coronavirus 2 by RT PCR NEGATIVE NEGATIVE Final    Comment: (NOTE) SARS-CoV-2 target nucleic acids  are NOT DETECTED.  The SARS-CoV-2 RNA is generally detectable in upper and lower respiratory specimens during the acute phase of infection. The lowest concentration of SARS-CoV-2 viral copies this assay can detect is 250 copies / mL. A negative result does not preclude SARS-CoV-2 infection and should not be used as the sole basis for treatment or other patient management decisions.  A negative result may occur with improper specimen collection / handling, submission of specimen other than nasopharyngeal swab, presence of viral mutation(s) within the areas targeted by this assay, and inadequate number of viral copies (<250 copies / mL). A negative result must be combined with clinical observations, patient history, and epidemiological information.  Fact Sheet for Patients:   https://www.patel.info/  Fact Sheet for Healthcare Providers: https://hall.com/  This test is not yet approved or  cleared by the [place] FDA and has been authorized for detection and/or diagnosis of SARS-CoV-2 by FDA under an Emergency Use Authorization (EUA).  This EUA will remain in effect (meaning this test can be used) for the duration of the COVID-19 declaration under Section 564(b)(1) of the Act, 21 U.S.C. section 360bbb-3(b)(1), unless the authorization is terminated or revoked sooner.  Performed at [facility], [place] [address], [place], [place] [address]     RADIOLOGY STUDIES/RESULTS: DG ESOPHAGUS W SINGLE CM (SOL OR THIN BA)  Result Date: 11/17/2021 CLINICAL DATA:  Dementia, recurrent pneumonia, concern for aspiration. Barium tablet did not move beyond the GE junction on same day modified barium swallow. EXAM: ESOPHAGUS/BARIUM SWALLOW/TABLET STUDY TECHNIQUE: A single contrast esophagram was performed using thin liquid barium. Additionally, the patient swallowed a 13 mm barium tablet under fluoroscopy. The exam was performed by Candiss Norse,  PA-C, and was supervised and interpreted by Kellie Simmering, D.O. FLUOROSCOPY: Fluoroscopy time: 2 minutes, 54 seconds (69.30 mGy). COMPARISON:  Modified barium swallow 11/17/2021. Chest CT 11/17/2021. FINDINGS: Limited examination due to the patient's inability to stand and limited ability to reposition on the fluoroscopy table. Markedly narrowed appearance of the distal esophagus/GE junction. A swallowed 13 mm barium tablet not pass beyond this point despite a prolonged period of observation. The esophagus is otherwise appears normal in caliber and smooth in contour. Esophageal dysmotility with decreased peristalsis. Moderate-sized hiatal hernia. No gastroesophageal reflux was observed. IMPRESSION: Limited examination due to the patient's inability to stand and limited ability to reposition on the fluoroscopy table. Markedly narrowed appearance of the distal esophagus/GE junction. A swallowed 13 mm barium tablet did not pass beyond this point despite a prolonged period of observation. These findings likely reflect a high-grade distal esophageal stricture and endoscopy should be considered for further evaluation. Esophageal dysmotility with decreased peristalsis. Moderate size hiatal hernia. Electronically Signed   By: Kellie Simmering D.O.   On: 11/17/2021 14:20     LOS: 4 days   Oren Binet, MD  [facility]    To contact the attending provider between 7A-7P or the covering provider during after hours 7P-7A, please log into the web site [email or web address] and access using universal [place] password for that web site. If you do not have the password, please call the hospital operator.  11/19/2021, 10:33 AM

## 2021-11-19 NOTE — TOC Progression Note (Signed)
Transition of Care [facility]) - Progression Note    Patient Details  Name: REILLEY LATORRE MRN: [id number] Date of Birth: May 01, 1951  [facility]) CM/SW [place], RN Phone Number: 11/19/2021, 3:45 PM  Clinical Narrative:     Patient was at [facility] place from June until admission. Presented with worsening SHOB and Pneumonia.  Patient wife has verbalized that she wants to take him home. PT OT consulted, awaiting their recommendations.  We will watch for any oxygen need He is having some GI issues and will undergo EGD on 7/24 after washout (plavix).  TOC wil follow for needs, recommendations, and transitions of care.  Expected Discharge Plan: [place] Barriers to Discharge: Continued Medical Work up  Expected Discharge Plan and Services Expected Discharge Plan: [place]   Discharge Planning Services: CM Consult Post Acute Care Choice: [place] arrangements for the past 2 months: [place]: PT, OT           Social Determinants of Health (SDOH) Interventions    Readmission Risk Interventions    10/12/2021    3:48 PM  Readmission Risk Prevention Plan  Transportation Screening Complete  Medication Review (RN Care Manager) Complete  PCP or Specialist appointment within 3-5 days of discharge Complete  HRI or Home Care Consult Complete  SW Recovery Care/Counseling Consult Complete  Palliative Care Screening Not Applicable  Skilled Nursing Facility Complete

## 2021-11-19 NOTE — Progress Notes (Signed)
We will check back on this patient 7/23 to plan probable EGD for 7/24.   - Wilfrid Lund, MD    [facility]

## 2021-11-20 ENCOUNTER — Inpatient Hospital Stay (HOSPITAL_COMMUNITY): Payer: Medicare Other

## 2021-11-20 DIAGNOSIS — J9621 Acute and chronic respiratory failure with hypoxia: Secondary | ICD-10-CM

## 2021-11-20 DIAGNOSIS — N179 Acute kidney failure, unspecified: Secondary | ICD-10-CM | POA: Diagnosis not present

## 2021-11-20 DIAGNOSIS — I480 Paroxysmal atrial fibrillation: Secondary | ICD-10-CM | POA: Diagnosis not present

## 2021-11-20 DIAGNOSIS — J189 Pneumonia, unspecified organism: Secondary | ICD-10-CM | POA: Diagnosis not present

## 2021-11-20 LAB — POCT I-STAT EG7
Acid-Base Excess: 6 mmol/L — ABNORMAL HIGH (ref 0.0–2.0)
Bicarbonate: 30.2 mmol/L — ABNORMAL HIGH (ref 20.0–28.0)
Calcium, Ion: 1.22 mmol/L (ref 1.15–1.40)
HCT: 42 % (ref 39.0–52.0)
Hemoglobin: 14.3 g/dL (ref 13.0–17.0)
O2 Saturation: 83 %
Patient temperature: 97.3
Potassium: 3.9 mmol/L (ref 3.5–5.1)
Sodium: 143 mmol/L (ref 135–145)
TCO2: 31 mmol/L (ref 22–32)
pCO2, Ven: 40 mmHg — ABNORMAL LOW (ref 44–60)
pH, Ven: 7.484 — ABNORMAL HIGH (ref 7.25–7.43)
pO2, Ven: 42 mmHg (ref 32–45)

## 2021-11-20 LAB — BASIC METABOLIC PANEL
Anion gap: 10 (ref 5–15)
Anion gap: 10 (ref 5–15)
BUN: 22 mg/dL (ref 8–23)
BUN: 20 mg/dL (ref 8–23)
CO2: 28 mmol/L (ref 22–32)
CO2: 28 mmol/L (ref 22–32)
Calcium: 8.9 mg/dL (ref 8.9–10.3)
Calcium: 8.8 mg/dL — ABNORMAL LOW (ref 8.9–10.3)
Chloride: 102 mmol/L (ref 98–111)
Chloride: 105 mmol/L (ref 98–111)
Creatinine, Ser: 1.11 mg/dL (ref 0.61–1.24)
Creatinine, Ser: 0.95 mg/dL (ref 0.61–1.24)
GFR, Estimated: >60 mL/min (ref >60)
GFR, Estimated: >60 mL/min (ref >60)
Glucose, Bld: 137 mg/dL — ABNORMAL HIGH (ref 70–99)
Glucose, Bld: 97 mg/dL (ref 70–99)
Potassium: 4.2 mmol/L (ref 3.5–5.1)
Potassium: 4 mmol/L (ref 3.5–5.1)
Sodium: 140 mmol/L (ref 135–145)
Sodium: 143 mmol/L (ref 135–145)

## 2021-11-20 LAB — MAGNESIUM
Magnesium: 2 mg/dL (ref 1.7–2.4)
Magnesium: 2.1 mg/dL (ref 1.7–2.4)

## 2021-11-20 LAB — MRSA NEXT GEN BY PCR, NASAL: MRSA by PCR Next Gen: NOT DETECTED

## 2021-11-20 LAB — CULTURE, BLOOD (ROUTINE X 2)
Culture: NO GROWTH
Special Requests: ADEQUATE

## 2021-11-20 LAB — BLOOD GAS, ARTERIAL
Acid-Base Excess: 4.8 mmol/L — ABNORMAL HIGH (ref 0.0–2.0)
Bicarbonate: 30.4 mmol/L — ABNORMAL HIGH (ref 20.0–28.0)
Drawn by: 656981
O2 Saturation: 99.3 %
Patient temperature: 36.7
pCO2 arterial: 47 mmHg (ref 32–48)
pH, Arterial: 7.41 (ref 7.35–7.45)
pO2, Arterial: 137 mmHg — ABNORMAL HIGH (ref 83–108)

## 2021-11-20 LAB — GLUCOSE, CAPILLARY
Glucose-Capillary: 108 mg/dL — ABNORMAL HIGH (ref 70–99)
Glucose-Capillary: 104 mg/dL — ABNORMAL HIGH (ref 70–99)
Glucose-Capillary: 95 mg/dL (ref 70–99)
Glucose-Capillary: 97 mg/dL (ref 70–99)
Glucose-Capillary: 88 mg/dL (ref 70–99)

## 2021-11-20 LAB — TROPONIN I (HIGH SENSITIVITY): Troponin I (High Sensitivity): 162 ng/L (ref <18)

## 2021-11-20 LAB — LACTIC ACID, PLASMA: Lactic Acid, Venous: 2.1 mmol/L (ref 0.5–1.9)

## 2021-11-20 LAB — BRAIN NATRIURETIC PEPTIDE: B Natriuretic Peptide: 326.4 pg/mL — ABNORMAL HIGH (ref 0.0–100.0)

## 2021-11-20 MED ORDER — PANTOPRAZOLE SODIUM 40 MG IV SOLR
40.0000 mg | Freq: Two times a day (BID) | INTRAVENOUS | Status: DC
  Administered 2021-11-20 – 2021-11-22 (×5): 40 mg via INTRAVENOUS
  Filled 2021-11-20 (×5): qty 10

## 2021-11-20 MED ORDER — LORAZEPAM 2 MG/ML IJ SOLN
1.0000 mg | Freq: Four times a day (QID) | INTRAMUSCULAR | Status: DC | PRN
Start: 2021-11-20 — End: 2021-11-27
  Administered 2021-11-20 – 2021-11-21 (×3): 1 mg via INTRAVENOUS
  Filled 2021-11-20 (×4): qty 1

## 2021-11-20 MED ORDER — CHLORHEXIDINE GLUCONATE CLOTH 2 % EX PADS
6.0000 | MEDICATED_PAD | Freq: Every day | CUTANEOUS | Status: DC
  Administered 2021-11-20 – 2021-11-27 (×8): 6 via TOPICAL

## 2021-11-20 MED ORDER — METOPROLOL TARTRATE 5 MG/5ML IV SOLN
5.0000 mg | Freq: Four times a day (QID) | INTRAVENOUS | Status: DC | PRN
Start: 2021-11-20 — End: 2021-11-27
  Administered 2021-11-20 – 2021-11-22 (×2): 5 mg via INTRAVENOUS
  Filled 2021-11-20 (×3): qty 5

## 2021-11-20 MED ORDER — DEXMEDETOMIDINE HCL IN NACL 400 MCG/100ML IV SOLN
0.4000 ug/kg/h | INTRAVENOUS | Status: DC
  Administered 2021-11-20: 0.4 ug/kg/h via INTRAVENOUS
  Administered 2021-11-21: 1.2 ug/kg/h via INTRAVENOUS
  Administered 2021-11-21: 0.5 ug/kg/h via INTRAVENOUS
  Administered 2021-11-21: 1.2 ug/kg/h via INTRAVENOUS
  Filled 2021-11-20 (×4): qty 100

## 2021-11-20 MED ORDER — FLUCONAZOLE 100 MG PO TABS
100.0000 mg | ORAL_TABLET | Freq: Every day | ORAL | Status: DC
  Filled 2021-11-20: qty 1

## 2021-11-20 MED ORDER — LORAZEPAM 2 MG/ML IJ SOLN
INTRAMUSCULAR | Status: AC
  Administered 2021-11-20: 1 mg
  Filled 2021-11-20: qty 1

## 2021-11-20 MED ORDER — FLUCONAZOLE 100MG IVPB
100.0000 mg | INTRAVENOUS | Status: AC
  Administered 2021-11-20 – 2021-11-25 (×6): 100 mg via INTRAVENOUS
  Filled 2021-11-20 (×6): qty 50

## 2021-11-20 MED ORDER — BISACODYL 10 MG RE SUPP
10.0000 mg | Freq: Once | RECTAL | Status: AC
  Administered 2021-11-20: 10 mg via RECTAL
  Filled 2021-11-20: qty 1

## 2021-11-20 NOTE — Progress Notes (Signed)
Patient's wife Kendre Jacinto called and updated about current plan of care and transfer to ICU.

## 2021-11-20 NOTE — Progress Notes (Signed)
Removing pt from bipap to give him a break. Placed pt on HHFNC 30L/50%. Pt tolerating well. Will continue to monitor.

## 2021-11-20 NOTE — Significant Event (Signed)
Patient's nurse notified me that patient became acutely hypoxemic requiring 15 L oxygen.  Did not complain of any chest pain.  On exam at bedside patient appears short of breath breathing around 35/min blood pressure is 104/60 pulse is 80/min.  Temperature afebrile.  Chest has bilateral air entry present no audible wheezes.  Chest x-ray shows worsening infiltrates.  We will place patient on BiPAP for now, check BNP ABG consult pulmonary critical care.  Gean Birchwood

## 2021-11-20 NOTE — Progress Notes (Signed)
NAME:  Mark Thomas, MRN:  [id number], DOB:  03/12/1951, LOS: 5 ADMISSION DATE:  11/22/2021, CONSULTATION DATE:  11/20/2021  REFERRING MD:  Waldron Labs, [facility], CHIEF COMPLAINT: Recurrent pneumonia  History of Present Illness:  71 year old never smoker that we are asked to evaluate for recurrent pneumonia. He was initially seen as a pulmonary consult 09/15/2018 for ILD and recurrent pneumonia.  He subsequently developed left hemi- opacity of his lung and was hospitalized multiple times.  He was treated with prolonged and multiple courses of IV antibiotics.  He required oxygen and was subsequently weaned off.  He is now a resident of [facility] for the last 6 weeks.  Bronchoscopy on 6/7 with BAL was negative for AFB, fungal and bacterial cultures. Swallowing evaluation 07/2021 showed mild aspiration risk and mild dysphagia  Was last hospitalization for 6/20 to 6/27 for recurrent pneumonia with a component of acute diastolic heart failure.  He was diuresed, treated with cefepime and steroids and discharged on 3 L of oxygen. He developed shortness of breath, right-sided chest pain and hypoxia for the last 4 days prior to this admission.  He was started on ceftriaxone and doxycycline.  He was also on Lasix and prednisone .  Chest x-ray 7/13 showed right more than left infiltrate. He was seen by our nurse practitioner on 7/14 for an office visit.  Chest x-ray 7/14 showed asymmetric airspace disease in the right mid and lower lung  He developed subjective fevers and cough and sought ED attention on 7/17 Labs showed leukocytosis 20 K increased from 12, creatinine 1.5, BNP 299, lactate was 2.9 and 2.5. Chest x-ray showed worsening infiltrates on the right in a patchy pattern consistent with bronchopneumonia.  He was started on cefepime and vancomycin and PCCM consulted.  PCCM following peripherally. Called again in the early a.m. of 7/22 after patient became acutely hypoxemic.  Patient awoke and panic with  increased oxygen requirement from 4 L to 15 L, nonrebreather placed.  Transitioned to BiPAP. ABG 7.41/47/137/30.4  Pertinent  Medical History  Atrial fibrillation status post Watchman device CAD status post stent, on Plavix Sick sinus syndrome status post pacemaker RLS, essential tremor HFpEF Chronic respiratory failure with hypoxia CKD stage III  Significant Hospital Events: Including procedures, antibiotic start and stop dates in addition to other pertinent events   CT chest without con 06/2021 patchy groundglass nodules in right upper lobe CTA chest 07/2021 mild peripheral interstitial opacities in lower lung?  ILD, patchy groundglass opacities in bilateral upper lobes. CT chest without con 10/2021 left lung opacities have improved, worsening patchy groundglass opacity throughout the right lung, trace effusions , distal esophageal wall thickening with moderate hiatal hernia. Swallow evaluation/MBS 07/2021 mild aspiration risk, no restriction 7/19 Modified barium swallow. Normal swallowing function. Barium was retained in esophagus and did not pas GE junction. Cefepime>Unasyn 7/22 PCCM called for hypoxia, patient started on BiPAP  Interim History / Subjective:  See above  Tmax 98.8  -825 mL past 24 hours, +2.3 L admission.  Subjective: Denies chest pain, endorses worsening shortness of breath.  Objective   Blood pressure 131/84, pulse 92, temperature 98.1 F (36.7 C), temperature source Axillary, resp. rate (!) 37, height _0  (1.854 m), weight 107 kg, SpO2 99 %.        Intake/Output Summary (Last 24 hours) at 11/20/2021 0433 Last data filed at 11/20/2021 0346 Gross per 24 hour  Intake --  Output 825 ml  Net -825 ml   Filed Weights   11/19/21  0404 11/19/21 0438 11/20/21 0350  Weight: 106.2 kg 106.7 kg 107 kg    Examination: General: In bed, some distress, chronically ill appearing HEENT: MM pink/moist, anicteric, atraumatic Neuro: RASS 0, PERRL 57m, GCS 15, MAE CV:  S1S2, paced, no m/r/g appreciated PULM: Tachypneic, air movement in all lobes, trachea midline, chest expansion symmetric GI: soft, bsx4 active, non-tender   Extremities: warm/dry, no pretibial edema, capillary refill less than 3 seconds  Skin:  no rashes or lesions noted  Labs: ABG 7.41/47/137/30.4 BNP Pending CBC pending Lactic acid pending BMP pending Mag Pending  Chest x-ray: Worsening opacities bilaterally, right worse than left  Resolved Hospital Problem list     Assessment & Plan:  Acute on chronic respiratory failure with hypoxia Recurrent pneumonia.   He has an extensive course over the last 3 to 4 months with progressive left white out of his left lung which is now resolved.  He is now developing extensive infiltrates on the right. CT now showing patchy and ground glass infiltrates on the R raising concern for COP or other inflammatory condition. Bronchoscopy 09/2021 was nondiagnostic for infectious etiology and BAL did not show eosinophils. - Differential in the setting includes mainly aspiration pneumonia and possibly COP/other inflammatory pneumonia.  Eosinophilic pneumonia seems unlikely given lack of eosinophils on BAL and slower response to steroids.  Repeat bronchoscopy would be low yield and since he is on Plavix, transbronchial biopsy would be contraindicated - MBSS with no movement of pill through GE junction   Chest x-ray with worsening opacities bilaterally.  Agree with Dr. IValeta Harms patient patient likely with esophageal stricture or something swelling esophageal motility, patient goes to sleep after eating, likely slightly aspirating into his airways.  Patient with oxygen requirement increasing at 4 L nasal cannula on day shift.  Now on BiPAP.  Tmax 98.8. -825 mL past 24 hours, +2.3 L admission. P: -Continue BiPAP.  Goal O2 sats 92 to 98%.  Wean to goal. Avoid sedatives.  Holding on transfer to ICU with current interventions, will return to reassess. -Continue  Unasyn -N.p.o. for now on BiPAP.  Okay with sips for meds.  Patient needs to be sitting up.  No eating 1 hour prior to bed. -On prednisone 20 mg -Continue holding Plavix for EGD per GI -Grade Dr. IValeta Harms consider autoimmune work-up if GI work-up shows nothing. -Follow-up CBC, BMP, BNP, lactate -May consider diuresis based on labs. -Aggressive pulmonary toilet. Continue guaifenesin.  Deep breathing and cough as able.  Chronic HFpEF History of SVT History of PAF status post watchman's device (03/22/2019) History of CAD status post multiple PCI's History of ASD (status post closure) Moderate AS -825 mL past 24 hours, +2.3 L admission.  Not hypotensive -Follow-up CBC, BMP, BNP, lactate -Continue continue metoprolol 37.5 mg p.o. twice daily -Not on AC due to Watchman device placement -Continue holding Plavix for GI procedure.  On aspirin 81 daily -Goal K above 4, goal MG above 2  History of vascular dementia History of CVA -Continue Namenda and Seroquel -On aspirin, statin  AKI on CKD stage IIIa 825 UOP past 24 hours -Checking BMP  GERD -PPI  Constipation Last bowel movement 7/19.  Patient refused MiraLAX per bedside RN.  On oxycodone -Continue MiraLAX -Suppository x1  Oral thrush -Continue IV Diflucan  Best Practice (right click and "Reselect all SmartList Selections" daily)   Diet/type: NPO w/ oral meds DVT prophylaxis: LMWH GI prophylaxis: PPI Lines: N/A Foley:  N/A Code Status:  full code Last date of multidisciplinary  goals of care discussion [Per primary]  Critical care time: n/a     Redmond School., MSN, APRN, AGACNP-BC [facility]  11/20/2021 , 5:05 AM  Please see [email or web address] for pager details  If no response, please call [phone] After hours, please call Elink at [phone]

## 2021-11-20 NOTE — Progress Notes (Signed)
PROGRESS NOTE        PATIENT DETAILS Name: Mark Thomas Age: 71 y.o. Sex: male Date of Birth: 12-18-1950 Admit Date: 11/07/2021 Admitting Physician Marcelyn Bruins, MD BZJ:IRCVELFY, Einar Pheasant, DO  Brief Summary: Patient is a 71 y.o.  male with history of atrial fibrillation-s/p watchman's device, CAD s/p PCI, CKD stage IIIa, chronic HFpEF, orthostatic hypotension, dementia-who has had numerous hospitalizations for recurrent pneumonia-presented to the ED on 7/17 with worsening shortness of breath and cough-found to have recurrent PNA and admitted to the hospitalist service.   Significant events: 4/05-4/10>> hospitalization for PNA 4/27-4/30>> hospitalization for sepsis/hypoxia due to PNA. 6/04 6/15>> hospitalization for severe hypoxia requiring HFNC for PNA.  Discharged on long tapering steroids. 6/20-6/27>> hospitalization for hypoxia due to recurrent PNA 7/17>> admit to [facility]-recurrent PNA 7/20>> distal esophageal narrowing on barium esophagogram-GI consulted-EGD 7/24 after of 5-day Plavix washout. 7/22>> worsening shortness of breath-chest x-ray much worse-BiPAP started.  Subsequently pulled off his BiPAP-desaturated-given Ativan-being transferred to the ICU for close monitoring.    Significant studies: 7/17>> CXR: Worsening patchy right-sided opacities 7/17>> CT chest: Worsening patchy opacities in the right lung-overall improvement in left lung opacities. 7/20>> barium esophagogram: Marked narrowed appearance of distal esophagus  Significant microbiology data: 6/07>> BAL culture: Negative  6/07>> BAL AFB smear: Negative 6/07>> BAL AFB culture: Pending 6/07>> BAL fungal culture: Negative 6/07>> BAL cytology: No malignant cells identified 7/17>> COVID PCR: Negative 7/17>> blood culture: Negative  Procedures: 6/07>> bronchoscopy  Consults: PCCM GI  Subjective: Seen earlier this morning-found to have pulled off his BiPAP-in some distress-O2  saturations in the 60s.  With the help of nursing staff-BiPAP placed back-very restless-given 1 mg of IV Ativan.  Objective: Vitals: Blood pressure (!) 160/95, pulse 92, temperature 97.8 F (36.6 C), temperature source Axillary, resp. rate (!) 29, height _0  (1.854 m), weight 107 kg, SpO2 97 %.   Exam: Gen Exam: Tachypneic-in some amount of distress. HEENT:atraumatic, normocephalic Chest: Scattered rhonchi-rales up to mid lungs bilaterally. CVS:S1S2 regular Abdomen:soft non tender, non distended Extremities:no edema Neurology: Non focal Skin: no rash   Pertinent Labs/Radiology:    Latest Ref Rng & Units 11/18/2021    5:08 AM 11/17/2021    4:41 AM 11/16/2021    4:13 AM  CBC  WBC 4.0 - 10.5 K/uL 12.3  11.4  11.3   Hemoglobin 13.0 - 17.0 g/dL 10.2  9.8  9.8   Hematocrit 39.0 - 52.0 % 31.4  30.3  31.5   Platelets 150 - 400 K/uL 145  146  120     Lab Results  Component Value Date   NA 140 11/20/2021   K 4.2 11/20/2021   CL 102 11/20/2021   CO2 28 11/20/2021      Assessment/Plan: Recurrent pneumonia: Has had numerous hospitalizations for recurrent pneumonia-given barium esophagogram findings of a possible distal esophageal stricture-highly likely this is due to aspiration.  Unfortunately-looks like he may have had another aspiration event overnight-severely hypoxemic-on BiPAP-PCCM reconsulted this morning-plan is to transfer to the ICU for close monitoring.  Continue Unasyn.  Acute on chronic hypoxic respiratory failure: Worsening hypoxemia-likely had reflux/aspiration event overnight-chest x-ray much worse-on BiPAP-very restless-given 1 mg of Ativan to control anxiety as he pulled off his BiPAP earlier this morning.  Discussed with PCCM MD-transferred to the ICU for close monitoring.    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  Changed to IV PPI-maintain aspiration  precautions-patient/nursing staff aware-to elevate head end of bed at all times.  AKI on CKD stage IIIa: AKI hemodynamically mediated-back to baseline.    History of interstitial lung disease: Suspected GERD contributing to his ILD.  Supportive care-on PPI.   Chronic HFpEF: Volume status stable-do not think he requires any further diuretics at this point.     History SVT: Continue beta-blocker-evaluated by cardiology in his prior admits-not felt to have A-fib-continue beta-blocker.     History of PAF-s/p watchman's device 03/2019 and RFA with isolation of all 4 pulmonary veins in 2021: Maintaining sinus rhythm-Per spouse-they have been notified by outpatient rhythm management group that he has been having atrial fibrillation lately.  Discussed with cardiology-Dr.Acharya on 7/19-then discussed with EP-since he has no strokelike symptoms-recommendations were to have patient follow-up with his outpatient EP physician. Since he has a watchman's device in place-no longer on anticoagulation.     History of CAD s/p multiple PCI's: Currently without any anginal symptoms.  Continue aspirin/statin/beta-blocker.  Plavix on hold for EGD next week.   History of ASD-s/p closure 01/22/2020   History of moderate aortic stenosis: Stable for outpatient follow-up with cardiology.  History of CVA: No focal deficits.  Continue antiplatelets-statin.   History of sick sinus syndrome-s/p PPM placement: Continue telemetry monitoring.   History of vascular dementia: Appears to be mild-continue Namenda//Seroquel.    History of anxiety/depression: Continue Lexapro  History of RLS: Continue Requip   History of orthostatic hypotension: Continue midodrine.     GERD: Continue PPI  Chronic pain syndrome: Continue home regimen of narcotics.  Obesity: Estimated body mass index is 31.12 kg/m as calculated from the following:   Height as of this encounter: _0  (1.854 m).   Weight as of this encounter: 107 kg.    Code status:   Code Status: Full Code   DVT Prophylaxis: enoxaparin (LOVENOX) injection 40 mg Start: 11/14/2021 2200   Family Communication: Spouse Annette-[phone] updated on 7/22  Disposition Plan: Status is: Inpatient Remains inpatient appropriate because: Worsening hypoxemia-being transferred to the ICU.   Planned Discharge Destination: Probably SNF.   Diet: Diet Order             Diet NPO time specified Except for: Sips with Meds  Diet effective now                     Antimicrobial agents: Anti-infectives (From admission, onward)    Start     Dose/Rate Route Frequency Ordered Stop   11/17/21 1600  Ampicillin-Sulbactam (UNASYN) 3 g in sodium chloride 0.9 % 100 mL IVPB        3 g 200 mL/hr over 30 Minutes Intravenous Every 6 hours 11/17/21 1431     11/16/21 2000  vancomycin (VANCOREADY) IVPB 1250 mg/250 mL  Status:  Discontinued        1,250 mg 166.7 mL/hr over 90 Minutes Intravenous Every 24 hours 11/17/2021 1842 11/17/21 1404   11/16/21 1230  fluconazole (DIFLUCAN) IVPB 100 mg        100 mg 50 mL/hr over 60 Minutes Intravenous Every 24 hours 11/16/21 1111     11/16/21 1000  ceFEPIme (MAXIPIME) 2 g in sodium chloride 0.9 % 100 mL IVPB  Status:  Discontinued        2 g 200 mL/hr over 30 Minutes Intravenous Every 12 hours 11/07/2021 1912 11/17/21 1404  11/20/2021 1630  vancomycin (VANCOREADY) IVPB 2000 mg/400 mL        2,000 mg 200 mL/hr over 120 Minutes Intravenous  Once 11/16/2021 1627 11/09/2021 2029   11/11/2021 1615  ceFEPIme (MAXIPIME) 2 g in sodium chloride 0.9 % 100 mL IVPB        2 g 200 mL/hr over 30 Minutes Intravenous  Once 11/28/2021 1609 11/14/2021 1850        MEDICATIONS: Scheduled Meds:  allopurinol  100 mg Oral Daily   aspirin EC  81 mg Oral Daily   atorvastatin  80 mg Oral Daily   enoxaparin (LOVENOX) injection  40 mg Subcutaneous Q24H   escitalopram  20 mg Oral Daily   latanoprost  1 drop Both Eyes QHS   memantine  10 mg Oral BID    metoprolol tartrate  37.5 mg Oral BID   midodrine  2.5 mg Oral TID WC   mometasone-formoterol  2 puff Inhalation BID   mouth rinse  15 mL Mouth Rinse 4 times per day   oxyCODONE-acetaminophen  1 tablet Oral Q8H   pantoprazole  40 mg Oral QPM   predniSONE  20 mg Oral Q breakfast   primidone  100 mg Oral BID   QUEtiapine  100 mg Oral QHS   ranolazine  1,000 mg Oral BID   rOPINIRole  4 mg Oral QHS   saccharomyces boulardii  250 mg Oral BID   sodium chloride flush  3 mL Intravenous Q12H   Continuous Infusions:  ampicillin-sulbactam (UNASYN) IV 3 g (11/20/21 0350)   fluconazole (DIFLUCAN) IV 100 mg (11/19/21 1206)   PRN Meds:.acetaminophen **OR** acetaminophen, albuterol, benzonatate, chlorpheniramine-HYDROcodone, guaiFENesin-dextromethorphan, LORazepam, mouth rinse, polyethylene glycol   I have personally reviewed following labs and imaging studies  LABORATORY DATA: CBC: Recent Labs  Lab 11/28/2021 1519 11/16/21 0413 11/17/21 0441 11/18/21 0508  WBC 20.0* 11.3* 11.4* 12.3*  NEUTROABS 17.9*  --   --   --   HGB 12.0* 9.8* 9.8* 10.2*  HCT 39.4 31.5* 30.3* 31.4*  MCV 102.1* 100.0 99.0 97.2  PLT 198 120* 146* 145*     Basic Metabolic Panel: Recent Labs  Lab 11/16/21 0413 11/17/21 0441 11/18/21 0508 11/19/21 0452 11/20/21 0430  NA 137 138 139 141 140  K 3.8 4.5 4.4 4.6 4.2  CL 103 104 103 103 102  CO2 _0 GLUCOSE 102* 147* 120* 114* 137*  BUN _1 CREATININE 1.12 1.03 0.92 0.98 1.11  CALCIUM 8.6* 8.8* 9.1 9.2 8.9  MG  --   --   --   --  2.0     GFR: Estimated Creatinine Clearance: 81.7 mL/min (by C-G formula based on SCr of 1.11 mg/dL).  Liver Function Tests: Recent Labs  Lab 11/01/2021 1519 11/16/21 0413 11/18/21 0508  AST 45* 23 23  ALT 45* 31 32  ALKPHOS 56 43 49  BILITOT 0.7 0.5 0.9  PROT 7.1 5.7* 5.9*  ALBUMIN 2.9* 2.3* 2.3*    No results for input(s): "LIPASE", "AMYLASE" in the last 168 hours. No results for input(s):  "AMMONIA" in the last 168 hours.  Coagulation Profile: Recent Labs  Lab 11/07/2021 1519  INR 1.0     Cardiac Enzymes: No results for input(s): "CKTOTAL", "CKMB", "CKMBINDEX", "TROPONINI" in the last 168 hours.  BNP (last 3 results) Recent Labs    03/18/21 1004  PROBNP 370     Lipid Profile: No results for input(s): "CHOL", "HDL", "LDLCALC", "TRIG", "CHOLHDL", "LDLDIRECT"  in the last 72 hours.  Thyroid Function Tests: No results for input(s): "TSH", "T4TOTAL", "FREET4", "T3FREE", "THYROIDAB" in the last 72 hours.  Anemia Panel: No results for input(s): "VITAMINB12", "FOLATE", "FERRITIN", "TIBC", "IRON", "RETICCTPCT" in the last 72 hours.  Urine analysis:    Component Value Date/Time   COLORURINE AMBER (A) 10/07/2021 2003   APPEARANCEUR CLEAR 10/07/2021 2003   LABSPEC 1.021 10/07/2021 2003   PHURINE 5.0 10/07/2021 2003   GLUCOSEU NEGATIVE 10/07/2021 2003   HGBUR NEGATIVE 10/07/2021 2003   [place] NEGATIVE 10/07/2021 2003   [place] NEGATIVE 10/07/2021 2003   PROTEINUR 30 (A) 10/07/2021 2003   NITRITE NEGATIVE 10/07/2021 2003   LEUKOCYTESUR NEGATIVE 10/07/2021 2003    Sepsis Labs: Lactic Acid, Venous    Component Value Date/Time   LATICACIDVEN 2.1 (HH) 11/20/2021 0430    MICROBIOLOGY: Recent Results (from the past 240 hour(s))  Blood Culture (routine x 2)     Status: None (Preliminary result)   Collection Time: 11/25/2021  3:54 PM   Specimen: BLOOD  Result Value Ref Range Status   Specimen Description BLOOD LEFT ANTECUBITAL  Final   Special Requests   Final    BOTTLES DRAWN AEROBIC AND ANAEROBIC Blood Culture adequate volume   Culture   Final    NO GROWTH 4 DAYS Performed at [facility], [place] [address], [place], [place] [address]    Report Status PENDING  Incomplete  MRSA Next Gen by PCR, Nasal     Status: None   Collection Time: 11/11/2021  4:27 PM   Specimen: Nasal Mucosa; Nasal Swab  Result Value Ref Range Status   MRSA by PCR Next Gen NOT  DETECTED NOT DETECTED Final    Comment: (NOTE) The GeneXpert MRSA Assay (FDA approved for NASAL specimens only), is one component of a comprehensive MRSA colonization surveillance program. It is not intended to diagnose MRSA infection nor to guide or monitor treatment for MRSA infections. Test performance is not FDA approved in patients less than 33 years old. Performed at [facility], [place] [address] [place], [place] [address]   SARS Coronavirus 2 by RT PCR (hospital order, performed in [facility] hospital lab) *cepheid single result test* Anterior Nasal Swab     Status: None   Collection Time: 11/06/2021  7:42 PM   Specimen: Anterior Nasal Swab  Result Value Ref Range Status   SARS Coronavirus 2 by RT PCR NEGATIVE NEGATIVE Final    Comment: (NOTE) SARS-CoV-2 target nucleic acids are NOT DETECTED.  The SARS-CoV-2 RNA is generally detectable in upper and lower respiratory specimens during the acute phase of infection. The lowest concentration of SARS-CoV-2 viral copies this assay can detect is 250 copies / mL. A negative result does not preclude SARS-CoV-2 infection and should not be used as the sole basis for treatment or other patient management decisions.  A negative result may occur with improper specimen collection / handling, submission of specimen other than nasopharyngeal swab, presence of viral mutation(s) within the areas targeted by this assay, and inadequate number of viral copies (<250 copies / mL). A negative result must be combined with clinical observations, patient history, and epidemiological information.  Fact Sheet for Patients:   https://www.patel.info/  Fact Sheet for Healthcare Providers: https://hall.com/  This test is not yet approved or  cleared by the [place] FDA and has been authorized for detection and/or diagnosis of SARS-CoV-2 by FDA under an Emergency Use Authorization (EUA).  This EUA will  remain in effect (  meaning this test can be used) for the duration of the COVID-19 declaration under Section 564(b)(1) of the Act, 21 U.S.C. section 360bbb-3(b)(1), unless the authorization is terminated or revoked sooner.  Performed at [facility], [place] [address] [place], [place] [address]     RADIOLOGY STUDIES/RESULTS: DG Chest Port 1 View  Result Date: 11/20/2021 CLINICAL DATA:  Respiratory distress. EXAM: PORTABLE CHEST 1 VIEW COMPARISON:  November 15, 2021 FINDINGS: There is a dual lead AICD. Marked severity bilateral infiltrates are seen. This is more prominent throughout the right lung and is increased in severity when compared to the prior study. No pleural effusion or pneumothorax is identified. The cardiac silhouette is enlarged and limited in evaluation secondary to the previously noted infiltrates. A radiopaque atrial septal occlusion device is seen. No acute osseous abnormalities are identified. IMPRESSION: Marked severity bilateral infiltrates, right greater than left, increased in severity when compared to the prior study. Electronically Signed   By: Virgina Norfolk M.D.   On: 11/20/2021 03:52     LOS: 5 days   Oren Binet, MD  [facility]    To contact the attending provider between 7A-7P or the covering provider during after hours 7P-7A, please log into the web site [email or web address] and access using universal [place] password for that web site. If you do not have the password, please call the hospital operator.  11/20/2021, 7:23 AM

## 2021-11-20 NOTE — Progress Notes (Signed)
NP, Hillery Aldo, assessed patient bedside and titrated FIO2 from 75 to 70%. Patient does not display distress. O2 96%. Instructed to titrate FIO2 down as appropriate to patient's tolerance.

## 2021-11-20 NOTE — Progress Notes (Signed)
PT Cancellation Note  Patient Details Name: Mark Thomas MRN: [id number] DOB: [birth year]   Cancelled Treatment:    Reason Eval/Treat Not Completed: Medical issues which prohibited therapy Pt transferred off floor to ICU. Will follow up when pt medically appropriate and as schedule allows.   Lou Miner, DPT  Acute Rehabilitation Services  Office: [phone]    Mark Thomas 11/20/2021, 9:51 AM

## 2021-11-20 NOTE — Progress Notes (Signed)
[facility] Progress Note Patient Name: SEATON HOFMANN DOB: 12-Jun-1950 MRN: [id number]   Date of Service  11/20/2021  HPI/Events of Note  Received request for precedex gtt.    Pt having a difficult time tolerating BIPAP.  He has acute on chronic hypoxic respiratory failure with worsening hypoxemia likely from aspiration.    On camera assessment, pt trying to take off mask.  BP 180/103, HR 104, RR 30s, O2 sats 97%.   eICU Interventions  Ok to start on precedex gtt.      Intervention Category Intermediate Interventions: Other:  Elsie Lincoln 11/20/2021, 11:17 PM   1:56 AM Notified of elevated troponin at 162. This was ordered earlier due to complaint of chest pain.  EKG with sinus rhythm and PACS, no acute ST elevation.   Plan> Continue aspirin, statin.  Continue to trend troponin.

## 2021-11-20 NOTE — Progress Notes (Signed)
Patient transferred from chair to bed. Patient oxygen sats decreased from 92% on 4.5L to 78%. Oxygen increased to 15L HFNC during transfer and patient remained on 15L. RN titrated patient down slowly throughout the shift as patient would tolerate. Will continue to monitor.

## 2021-11-20 NOTE — Progress Notes (Signed)
Placed pt on bipap due to increased WOB. Timed with RN to give with ativan to help with anxiety as pt was pulling on [place] and mask previously

## 2021-11-20 NOTE — Progress Notes (Signed)
Pt transported from 5W to 82M on bipap with Rapid Response and RN. Pt stable throughout with no complications. RT will continue to monitor

## 2021-11-20 NOTE — Progress Notes (Signed)
[facility] Progress Note Patient Name: MARCELO ICKES DOB: 03/19/1951 MRN: [id number]   Date of Service  11/20/2021  HPI/Events of Note  Notified that patient has had some chest pain. RN had already done EKG. Seen on camera. Patient taking a break off BIPAP, currently on optiflow with RR 24 and o2 sat 92. No overt distress though does have some tachypnea. When asked, he points to epigastric area and says he has a stretching type of pain. EKG does not show acute ischemia, does show some PACs.   eICU Interventions  BMP, mag and troponin now RN also concerned that he cannot swallow medications well and is planned for an EGD on Monday. Is hypertensive and due to have oral metoprolol. Will change it to IV. May need to consider an NG in the AM if this issue persists He also has not eaten anything so the epigastric pain could be from reflux - is going to get IV protonix now   D/w RN.      Intervention Category Major Interventions: Respiratory failure - evaluation and management  Margaretmary Lombard 11/20/2021, 9:43 PM

## 2021-11-20 NOTE — Progress Notes (Signed)
OT Cancellation Note  Patient Details Name: Mark Thomas MRN: [id number] DOB: 1950-10-04   Cancelled Treatment:    Reason Eval/Treat Not Completed: Medical issues which prohibited therapy (Pt transferred to ICU from progressive floor. OT evaluation to f/u when medically approrpiate and as schedule allows.)  Mark Thomas 11/20/2021, 10:12 AM

## 2021-11-20 NOTE — Progress Notes (Addendum)
Pharmacy Antibiotic Note  KAYLA WEEKES is a 71 y.o. male admitted on day #3 Vancomycin and Cefepime for HCAP coverage. Ceftriaxone and Doxycyline started 2 days prior to admission but no improvement. To transition to Unasyn for aspiration pneumonia coverage.  Hx PCN allergy as a child, reaction unknown.  Has had Augmentin in the past.   Patient worsened overnight per NP with concerns for an esophageal stricture causing aspiration, now on BiPAP. Planning to transfer to ICU. He remains afebrile. No culture updates. Fluconazole and Unasyn inpatient stop dates not yet determined.   Plan: Continue Unasyn 3 gm IV q6h Follow renal function, clinical course, abx plans.  Height: _0  (185.4 cm) Weight: 107 kg (235 lb 14.3 oz) IBW/kg (Calculated) : 79.9  Temp (24hrs), Avg:98.1 F (36.7 C), Min:97.4 F (36.3 C), Max:98.8 F (37.1 C)  Recent Labs  Lab 11/06/2021 1519 11/17/2021 1554 11/18/2021 1737 11/16/21 0413 11/17/21 0441 11/18/21 0508 11/19/21 0452 11/20/21 0430  WBC 20.0*  --   --  11.3* 11.4* 12.3*  --   --   CREATININE 1.53*  --   --  1.12 1.03 0.92 0.98 1.11  LATICACIDVEN  --  2.9* 2.5*  --   --   --   --  2.1*     Estimated Creatinine Clearance: 81.7 mL/min (by C-G formula based on SCr of 1.11 mg/dL).    Allergies  Allergen Reactions   Dilaudid [Hydromorphone] Other (See Comments)    Respiratory Arrest!!!!   Bactrim [Sulfamethoxazole-Trimethoprim] Rash   Benadryl [Diphenhydramine] Other (See Comments)    Muscle spasms   Phenobarbital Other (See Comments)    From childhood- reaction not recalled at this time   Penicillins Other (See Comments)    From childhood- reaction not recalled at this time    Antimicrobials this admission:  Ceftriaxone and Doxycycline PTA x 2 days  Vancomycin 7/17>>7/19  Cefepime 7/17 >>7/19  Fluconazole IV 7/18 >>  Unasyn 7/19 >>   Microbiology results:  7/17 blood: no growth x 4 days  7/17 COVID: neg  7/17 MRSA PCR: neg  Thank you for  allowing pharmacy to be a part of this patient's care.  Vicenta Dunning, PharmD  PGY1 Pharmacy Resident   I discussed / reviewed the pharmacy note by Dr. Kara Mead and I agree with the resident's findings and plans as documented.   Thank you Anette Guarneri, PharmD

## 2021-11-20 NOTE — Progress Notes (Signed)
Patient awoke in a panic, oxygen sats plummeted and he was very agitated and pushing staff away, stating i can't breathe. Patient was 72% on 7L HFNC. RR 35-45. Neb given. Nonrebreather placed at 15L along with 15L HFNC. CXR captured. BMP, Mag, BNP, ABG collected. Remained at patient's side until relaxed and oxygen recovered. MD contacted and assessed patient at bedside. Bipap placed. Patient resting. Will continue to monitor.

## 2021-11-20 NOTE — Progress Notes (Addendum)
Switched pt from bipap to [facility] to see if he would be more comfortable and would help with his WOB.

## 2021-11-20 NOTE — Progress Notes (Addendum)
Addendum --  In interim, sounds like has pulled off BiBAP again Will move to ICU   _____  Called to bedside to assess pt  Pt became agitated and removed his BiPAP when he felt like he needed to have a BM-- sounds like he quickly desatted to 60-70s  Sats improved to mid 90s when BiPAP back on Received 57m ativan after this, and prior to my arrival.   Says his breathing feels about the same, overall doesn't feel too bad.   He is calm, cooperative, following commands, Oriented x3. Tachypneic. Soft abdomen.    I raised HOB to 30*, decr FiO2  to 60%. d/w pt importance of call bell use when needing assistance. Cont NPO. Ordering for sitter. Ok to stay on progressive for now.  D/w plan with RN, feels comfortable with current status & has my contact if we are needed urgently prior to re-assessing later this morning    GEliseo GumMSN, AGACNP-BC LBroken Arrowfor pager 11/20/2021, 8:12 AM

## 2021-11-21 ENCOUNTER — Inpatient Hospital Stay (HOSPITAL_COMMUNITY): Payer: Medicare Other

## 2021-11-21 DIAGNOSIS — J189 Pneumonia, unspecified organism: Secondary | ICD-10-CM | POA: Diagnosis not present

## 2021-11-21 DIAGNOSIS — R1319 Other dysphagia: Secondary | ICD-10-CM | POA: Diagnosis not present

## 2021-11-21 DIAGNOSIS — R933 Abnormal findings on diagnostic imaging of other parts of digestive tract: Secondary | ICD-10-CM | POA: Diagnosis not present

## 2021-11-21 LAB — COMPREHENSIVE METABOLIC PANEL
ALT: 29 U/L (ref 0–44)
AST: 29 U/L (ref 15–41)
Albumin: 2.1 g/dL — ABNORMAL LOW (ref 3.5–5.0)
Alkaline Phosphatase: 122 U/L (ref 38–126)
Anion gap: 11 (ref 5–15)
BUN: 19 mg/dL (ref 8–23)
CO2: 28 mmol/L (ref 22–32)
Calcium: 8.7 mg/dL — ABNORMAL LOW (ref 8.9–10.3)
Chloride: 106 mmol/L (ref 98–111)
Creatinine, Ser: 1 mg/dL (ref 0.61–1.24)
GFR, Estimated: >60 mL/min (ref >60)
Glucose, Bld: 93 mg/dL (ref 70–99)
Potassium: 4 mmol/L (ref 3.5–5.1)
Sodium: 145 mmol/L (ref 135–145)
Total Bilirubin: 1.1 mg/dL (ref 0.3–1.2)
Total Protein: 6 g/dL — ABNORMAL LOW (ref 6.5–8.1)

## 2021-11-21 LAB — POCT I-STAT 7, (LYTES, BLD GAS, ICA,H+H)
Acid-Base Excess: 3 mmol/L — ABNORMAL HIGH (ref 0.0–2.0)
Acid-Base Excess: 3 mmol/L — ABNORMAL HIGH (ref 0.0–2.0)
Bicarbonate: 27 mmol/L (ref 20.0–28.0)
Bicarbonate: 31.4 mmol/L — ABNORMAL HIGH (ref 20.0–28.0)
Calcium, Ion: 1.2 mmol/L (ref 1.15–1.40)
Calcium, Ion: 1.21 mmol/L (ref 1.15–1.40)
HCT: 29 % — ABNORMAL LOW (ref 39.0–52.0)
HCT: 37 % — ABNORMAL LOW (ref 39.0–52.0)
Hemoglobin: 9.9 g/dL — ABNORMAL LOW (ref 13.0–17.0)
Hemoglobin: 12.6 g/dL — ABNORMAL LOW (ref 13.0–17.0)
O2 Saturation: 95 %
O2 Saturation: 90 %
Patient temperature: 99.7
Patient temperature: 97.8
Potassium: 3.7 mmol/L (ref 3.5–5.1)
Potassium: 4.4 mmol/L (ref 3.5–5.1)
Sodium: 145 mmol/L (ref 135–145)
Sodium: 145 mmol/L (ref 135–145)
TCO2: 28 mmol/L (ref 22–32)
TCO2: 33 mmol/L — ABNORMAL HIGH (ref 22–32)
pCO2 arterial: 38.4 mmHg (ref 32–48)
pCO2 arterial: 65.4 mmHg (ref 32–48)
pH, Arterial: 7.458 — ABNORMAL HIGH (ref 7.35–7.45)
pH, Arterial: 7.287 — ABNORMAL LOW (ref 7.35–7.45)
pO2, Arterial: 72 mmHg — ABNORMAL LOW (ref 83–108)
pO2, Arterial: 66 mmHg — ABNORMAL LOW (ref 83–108)

## 2021-11-21 LAB — GLUCOSE, CAPILLARY
Glucose-Capillary: 79 mg/dL (ref 70–99)
Glucose-Capillary: 119 mg/dL — ABNORMAL HIGH (ref 70–99)
Glucose-Capillary: 135 mg/dL — ABNORMAL HIGH (ref 70–99)
Glucose-Capillary: 161 mg/dL — ABNORMAL HIGH (ref 70–99)
Glucose-Capillary: 131 mg/dL — ABNORMAL HIGH (ref 70–99)
Glucose-Capillary: 112 mg/dL — ABNORMAL HIGH (ref 70–99)

## 2021-11-21 LAB — CBC
HCT: 31.8 % — ABNORMAL LOW (ref 39.0–52.0)
Hemoglobin: 10 g/dL — ABNORMAL LOW (ref 13.0–17.0)
MCH: 31.2 pg (ref 26.0–34.0)
MCHC: 31.4 g/dL (ref 30.0–36.0)
MCV: 99.1 fL (ref 80.0–100.0)
Platelets: 122 10*3/uL — ABNORMAL LOW (ref 150–400)
RBC: 3.21 MIL/uL — ABNORMAL LOW (ref 4.22–5.81)
RDW: 16.5 % — ABNORMAL HIGH (ref 11.5–15.5)
WBC: 11.4 10*3/uL — ABNORMAL HIGH (ref 4.0–10.5)
nRBC: 0.3 % — ABNORMAL HIGH (ref 0.0–0.2)

## 2021-11-21 LAB — TROPONIN I (HIGH SENSITIVITY): Troponin I (High Sensitivity): 446 ng/L (ref <18)

## 2021-11-21 MED ORDER — DEXTROSE IN LACTATED RINGERS 5 % IV SOLN: INTRAVENOUS | Status: DC

## 2021-11-21 MED ORDER — ETOMIDATE 2 MG/ML IV SOLN
INTRAVENOUS | Status: AC
  Administered 2021-11-21: 20 mg
  Filled 2021-11-21: qty 20

## 2021-11-21 MED ORDER — SODIUM CHLORIDE 0.9 % IV BOLUS
500.0000 mL | Freq: Once | INTRAVENOUS | Status: AC
Start: 2021-11-21 — End: 2021-11-21
  Administered 2021-11-21: 500 mL via INTRAVENOUS

## 2021-11-21 MED ORDER — FENTANYL 2500MCG IN NS 250ML (10MCG/ML) PREMIX INFUSION
25.0000 ug/h | INTRAVENOUS | Status: DC
  Administered 2021-11-21: 25 ug/h via INTRAVENOUS
  Administered 2021-11-22: 100 ug/h via INTRAVENOUS
  Administered 2021-11-23: 75 ug/h via INTRAVENOUS
  Administered 2021-11-25: 100 ug/h via INTRAVENOUS
  Filled 2021-11-21 (×5): qty 250

## 2021-11-21 MED ORDER — DEXTROSE 5 % IV SOLN: INTRAVENOUS | Status: AC

## 2021-11-21 MED ORDER — MIDAZOLAM HCL 2 MG/2ML IJ SOLN
INTRAMUSCULAR | Status: AC
  Administered 2021-11-21: 2 mg
  Filled 2021-11-21: qty 2

## 2021-11-21 MED ORDER — FENTANYL CITRATE PF 50 MCG/ML IJ SOSY
PREFILLED_SYRINGE | INTRAMUSCULAR | Status: AC
  Administered 2021-11-21: 100 ug
  Filled 2021-11-21: qty 2

## 2021-11-21 MED ORDER — PROPOFOL 1000 MG/100ML IV EMUL
0.0000 ug/kg/min | INTRAVENOUS | Status: DC
  Administered 2021-11-21: 5 ug/kg/min via INTRAVENOUS
  Administered 2021-11-22 (×3): 15 ug/kg/min via INTRAVENOUS
  Administered 2021-11-23 (×2): 25 ug/kg/min via INTRAVENOUS
  Administered 2021-11-25: 30 ug/kg/min via INTRAVENOUS
  Administered 2021-11-25: 10 ug/kg/min via INTRAVENOUS
  Administered 2021-11-26: 5 ug/kg/min via INTRAVENOUS
  Administered 2021-11-26: 20 ug/kg/min via INTRAVENOUS
  Administered 2021-11-27: 25 ug/kg/min via INTRAVENOUS
  Administered 2021-11-27: 15 ug/kg/min via INTRAVENOUS
  Filled 2021-11-21 (×13): qty 100

## 2021-11-21 MED ORDER — DOCUSATE SODIUM 50 MG/5ML PO LIQD: 100.0000 mg | Freq: Two times a day (BID) | ORAL | Status: DC

## 2021-11-21 MED ORDER — SUCCINYLCHOLINE CHLORIDE 200 MG/10ML IV SOSY
PREFILLED_SYRINGE | INTRAVENOUS | Status: AC
  Filled 2021-11-21: qty 10

## 2021-11-21 MED ORDER — FENTANYL CITRATE PF 50 MCG/ML IJ SOSY
25.0000 ug | PREFILLED_SYRINGE | INTRAMUSCULAR | Status: DC | PRN
  Administered 2021-11-21 (×2): 100 ug via INTRAVENOUS
  Administered 2021-11-22: 50 ug via INTRAVENOUS
  Administered 2021-11-23: 100 ug via INTRAVENOUS
  Filled 2021-11-21 (×2): qty 2

## 2021-11-21 MED ORDER — DOCUSATE SODIUM 50 MG/5ML PO LIQD
100.0000 mg | Freq: Two times a day (BID) | ORAL | Status: DC
  Administered 2021-11-23 (×2): 100 mg
  Filled 2021-11-21 (×2): qty 10

## 2021-11-21 MED ORDER — LACTATED RINGERS IV SOLN: INTRAVENOUS | Status: DC

## 2021-11-21 MED ORDER — POLYETHYLENE GLYCOL 3350 17 G PO PACK
17.0000 g | PACK | Freq: Every day | ORAL | Status: DC
  Administered 2021-11-23 – 2021-11-26 (×4): 17 g
  Filled 2021-11-21 (×4): qty 1

## 2021-11-21 MED ORDER — PHENYLEPHRINE 80 MCG/ML (10ML) SYRINGE FOR IV PUSH (FOR BLOOD PRESSURE SUPPORT)
PREFILLED_SYRINGE | INTRAVENOUS | Status: AC
  Administered 2021-11-21: 320 ug
  Filled 2021-11-21: qty 10

## 2021-11-21 MED ORDER — FENTANYL CITRATE PF 50 MCG/ML IJ SOSY: 25.0000 ug | PREFILLED_SYRINGE | Freq: Once | INTRAMUSCULAR | Status: AC

## 2021-11-21 MED ORDER — FENTANYL BOLUS VIA INFUSION
25.0000 ug | INTRAVENOUS | Status: DC | PRN
  Administered 2021-11-22: 50 ug via INTRAVENOUS
  Administered 2021-11-24 – 2021-11-25 (×3): 100 ug via INTRAVENOUS

## 2021-11-21 MED ORDER — DEXMEDETOMIDINE HCL IN NACL 400 MCG/100ML IV SOLN
0.0000 ug/kg/h | INTRAVENOUS | Status: DC
  Administered 2021-11-21: 1.2 ug/kg/h via INTRAVENOUS
  Filled 2021-11-21: qty 100

## 2021-11-21 MED ORDER — FENTANYL CITRATE PF 50 MCG/ML IJ SOSY: 25.0000 ug | PREFILLED_SYRINGE | INTRAMUSCULAR | Status: DC | PRN

## 2021-11-21 MED ORDER — ROCURONIUM BROMIDE 10 MG/ML (PF) SYRINGE
PREFILLED_SYRINGE | INTRAVENOUS | Status: AC
  Administered 2021-11-21: 50 mg
  Filled 2021-11-21: qty 10

## 2021-11-21 MED ORDER — POLYETHYLENE GLYCOL 3350 17 G PO PACK: 17.0000 g | PACK | Freq: Every day | ORAL | Status: DC

## 2021-11-21 MED ORDER — KETAMINE HCL 50 MG/5ML IJ SOSY
PREFILLED_SYRINGE | INTRAMUSCULAR | Status: AC
  Filled 2021-11-21: qty 5

## 2021-11-21 NOTE — Progress Notes (Signed)
Pt is complaining of chest pain 6/10 he stated " it feels like a stretching feeling". Vital signs, EKG and Blood sugar obtained. MD made aware and gave orders. Wife called and was updated on the pt's status.

## 2021-11-21 NOTE — Progress Notes (Signed)
OT Cancellation Note  Patient Details Name: Mark Thomas MRN: [id number] DOB: Sep 22, 1950   Cancelled Treatment:    Reason Eval/Treat Not Completed: Medical issues which prohibited therapy. Pt continuing to have respiratory distress, refusing BiPAP. He is at a high risk for intubation at this time. OT will follow up as pt is medically appropriate.   [name] 11/21/2021, 1:16 PM

## 2021-11-21 NOTE — Progress Notes (Signed)
With persistent increase in work of breathing Patient not keeping BiPAP on, abdominal breathing  Decision made to intubate  Successfully intubated with size 8 endotracheal tube

## 2021-11-21 NOTE — Progress Notes (Signed)
Pt is not tolerating his bi pap at this time. Pt is tachypneic and keeps trying to remove his mask. MD made aware and gave orders.

## 2021-11-21 NOTE — Progress Notes (Addendum)
eLink Physician-Brief Progress Note Patient Name: DENI LEFEVER DOB: Sep 10, 1950 MRN: [id number]   Date of Service  11/21/2021  HPI/Events of Note  Glucose on BMP at 93 and poc 79. Asking for low dextrose maintenance, NPO. EGD on Monday AM. Hg down to 10 from 14. Plt 122 Camera eval done On BiPAP, stable vitals. Low risk for intubation per notes. PNA. On precedex gtt.   eICU Interventions  - ordered dextrose at 40 ml/hr. Stop if glucose goes over 180.  - trend troponin. BNP at 326.     Intervention Category Intermediate Interventions: Other: (hypoglycemia risk)  Elmer Sow 11/21/2021, 4:04 AM

## 2021-11-21 NOTE — Progress Notes (Signed)
eLink Physician-Brief Progress Note Patient Name: Mark Thomas DOB: 1951/04/18 MRN: [id number]   Date of Service  11/21/2021  HPI/Events of Note  HCAP on BiPAP.asking for AM ABG  eICU Interventions  ABG ordered     Intervention Category Major Interventions: Infection - evaluation and management;Other: Minor Interventions: Routine modifications to care plan (e.g. PRN medications for pain, fever)  Elmer Sow 11/21/2021, 2:04 AM

## 2021-11-21 NOTE — Progress Notes (Signed)
Critical ABG results given to Dr Ander Slade pH 7.28 pCO2-65 pO2-66 HCO3- 31.4  Recommends increasing RR to 26

## 2021-11-21 NOTE — Procedures (Signed)
Intubation Procedure Note  COLBURN ASPER  [id number]  1950/09/14  Date:11/21/21  Time:4:02 PM   Provider Performing:Abisai Deer A Azilee Pirro    Procedure: Intubation (31500)  Indication(s) Respiratory Failure  Consent Risks of the procedure as well as the alternatives and risks of each were explained to the patient and/or caregiver.  Consent for the procedure was obtained and is signed in the bedside chart   Anesthesia Etomidate, Versed, Fentanyl, and Rocuronium 20 etomidate, 2 versed, 100 fentanyl, 50 rocuronium  Time Out Verified patient identification, verified procedure, site/side was marked, verified correct patient position, special equipment/implants available, medications/allergies/relevant history reviewed, required imaging and test results available.   Sterile Technique Usual hand hygeine, masks, and gloves were used   Procedure Description Patient positioned in bed supine.  Sedation given as noted above.  Patient was intubated with endotracheal tube using Glidescope.  View was Grade 1 full glottis .  Number of attempts was 1.  Colorimetric CO2 detector was consistent with tracheal placement.   Complications/Tolerance None; patient tolerated the procedure well. Chest X-ray is ordered to verify placement.   EBL none   Specimen(s) None

## 2021-11-21 NOTE — Progress Notes (Signed)
[facility] Progress Note  Chief Complaint: Dysphagia  History:  Since we last saw this patient, he has developed worsening respiratory distress, was transferred to the ICU put on BiPAP and Precedex for agitation.  He is currently stuporous and cannot add any additional history or review of systems.   Objective:   Current Facility-Administered Medications:    acetaminophen (TYLENOL) tablet 650 mg, 650 mg, Oral, Q6H PRN, 650 mg at 11/18/21 1002 **OR** acetaminophen (TYLENOL) suppository 650 mg, 650 mg, Rectal, Q6H PRN, Marcelyn Bruins, MD   albuterol (PROVENTIL) (2.5 MG/3ML) 0.083% nebulizer solution 2.5 mg, 2.5 mg, Nebulization, Q4H PRN, Marcelyn Bruins, MD, 2.5 mg at 11/20/21 2301   allopurinol (ZYLOPRIM) tablet 100 mg, 100 mg, Oral, Daily, Marcelyn Bruins, MD, 100 mg at 11/20/21 1548   Ampicillin-Sulbactam (UNASYN) 3 g in sodium chloride 0.9 % 100 mL IVPB, 3 g, Intravenous, Q6H, Skeet Simmer, RPH, Stopped at 11/21/21 0444   aspirin EC tablet 81 mg, 81 mg, Oral, Daily, Marcelyn Bruins, MD, 81 mg at 11/19/21 0846   atorvastatin (LIPITOR) tablet 80 mg, 80 mg, Oral, Daily, Marcelyn Bruins, MD, 80 mg at 11/19/21 2056   benzonatate (TESSALON) capsule 200 mg, 200 mg, Oral, TID PRN, Jonetta Osgood, MD, 200 mg at 11/19/21 1204   Chlorhexidine Gluconate Cloth 2 % PADS 6 each, 6 each, Topical, Daily, Olalere, Adewale A, MD, 6 each at 11/20/21 1000   chlorpheniramine-HYDROcodone 10-8 MG/5ML suspension 5 mL, 5 mL, Oral, Q12H PRN, Jonetta Osgood, MD, 5 mL at 11/19/21 2032   dexmedetomidine (PRECEDEX) 400 MCG/100ML (4 mcg/mL) infusion, 0.4-1.2 mcg/kg/hr, Intravenous, Titrated, Elsie Lincoln, MD, Last Rate: 13.38 mL/hr at 11/21/21 0700, 0.5 mcg/kg/hr at 11/21/21 0700   dextrose 5 % solution, , Intravenous, Continuous, Mohan, Kinila T, MD, Last Rate: 40 mL/hr at 11/21/21 0700, Infusion Verify at 11/21/21 0700   enoxaparin (LOVENOX) injection 40 mg, 40 mg, Subcutaneous, Q24H,  Marcelyn Bruins, MD, 40 mg at 11/20/21 2149   escitalopram (LEXAPRO) tablet 20 mg, 20 mg, Oral, Daily, Marcelyn Bruins, MD, 20 mg at 11/20/21 1548   fluconazole (DIFLUCAN) IVPB 100 mg, 100 mg, Intravenous, Q24H, Cozart, Hannah, RPH, Paused at 11/20/21 1457   guaiFENesin-dextromethorphan (ROBITUSSIN DM) 100-10 MG/5ML syrup 5 mL, 5 mL, Oral, Q4H PRN, Ghimire, Shanker M, MD, 5 mL at 11/19/21 0401   latanoprost (XALATAN) 0.005 % ophthalmic solution 1 drop, 1 drop, Both Eyes, QHS, Marcelyn Bruins, MD, 1 drop at 11/20/21 2329   LORazepam (ATIVAN) injection 1 mg, 1 mg, Intravenous, Q6H PRN, Jonetta Osgood, MD, 1 mg at 11/20/21 1608   memantine (NAMENDA) tablet 10 mg, 10 mg, Oral, BID, Marcelyn Bruins, MD, 10 mg at 11/19/21 2056   metoprolol tartrate (LOPRESSOR) injection 5 mg, 5 mg, Intravenous, Q6H PRN, Kamat, Sunil G, MD, 5 mg at 11/20/21 2156   midodrine (PROAMATINE) tablet 2.5 mg, 2.5 mg, Oral, TID WC, Marcelyn Bruins, MD, 2.5 mg at 11/19/21 1712   mometasone-formoterol (DULERA) 200-5 MCG/ACT inhaler 2 puff, 2 puff, Inhalation, BID, Marcelyn Bruins, MD, 2 puff at 11/20/21 1938   Oral care mouth rinse, 15 mL, Mouth Rinse, 4 times per day, Jonetta Osgood, MD, 15 mL at 11/20/21 2218   Oral care mouth rinse, 15 mL, Mouth Rinse, PRN, Ghimire, Henreitta Leber, MD   oxyCODONE-acetaminophen (PERCOCET/ROXICET) 5-325 MG per tablet 1 tablet, 1 tablet, Oral, Q8H, Ghimire, Henreitta Leber, MD, 1 tablet at 11/20/21 1411   pantoprazole (PROTONIX) injection  40 mg, 40 mg, Intravenous, Q12H, Ghimire, Henreitta Leber, MD, 40 mg at 11/20/21 2149   polyethylene glycol (MIRALAX / GLYCOLAX) packet 17 g, 17 g, Oral, Daily PRN, Marcelyn Bruins, MD   predniSONE (DELTASONE) tablet 20 mg, 20 mg, Oral, Q breakfast, Icard, Bradley L, DO, 20 mg at 11/19/21 0847   primidone (MYSOLINE) tablet 100 mg, 100 mg, Oral, BID, Marcelyn Bruins, MD, 100 mg at 11/19/21 2056   QUEtiapine (SEROQUEL) tablet 100 mg, 100 mg,  Oral, QHS, Marcelyn Bruins, MD, 100 mg at 11/19/21 2056   ranolazine (RANEXA) 12 hr tablet 1,000 mg, 1,000 mg, Oral, BID, Marcelyn Bruins, MD, 1,000 mg at 11/19/21 2056   rOPINIRole (REQUIP XL) 24 hr tablet 4 mg, 4 mg, Oral, QHS, Marcelyn Bruins, MD, 4 mg at 11/19/21 2056   saccharomyces boulardii (FLORASTOR) capsule 250 mg, 250 mg, Oral, BID, Kara Mead V, MD, 250 mg at 11/19/21 2056   sodium chloride flush (NS) 0.9 % injection 3 mL, 3 mL, Intravenous, Q12H, Marcelyn Bruins, MD, 3 mL at 11/20/21 1324   ampicillin-sulbactam (UNASYN) IV Stopped (11/21/21 0444)   dexmedetomidine (PRECEDEX) IV infusion 0.5 mcg/kg/hr (11/21/21 0700)   dextrose 40 mL/hr at 11/21/21 0700   fluconazole (DIFLUCAN) IV Stopped (11/20/21 1457)     Vital signs in last 24 hrs: Vitals:   11/21/21 0645 11/21/21 0700  BP: 101/61 112/70  Pulse: 86 (!) 103  Resp: (!) 28 (!) 34  Temp:    SpO2: 93% 92%    Intake/Output Summary (Last 24 hours) at 11/21/2021 0717 Last data filed at 11/21/2021 0700 Gross per 24 hour  Intake 1154.03 ml  Output 550 ml  Net 604.03 ml     Physical Exam  Chronically ill-appearing Stuporous Cardiac rhythm is regular with a harsh systolic murmur Lungs: Fair inspiratory effort, prolonged expiratory phase Abdomen soft nondistended with good bowel sounds  Recent Labs:     Latest Ref Rng & Units 11/21/2021    4:44 AM 11/21/2021   12:55 AM 11/20/2021    3:57 PM  CBC  WBC 4.0 - 10.5 K/uL  11.4    Hemoglobin 13.0 - 17.0 g/dL 9.9  10.0  14.3   Hematocrit 39.0 - 52.0 % 29.0  31.8  42.0   Platelets 150 - 400 K/uL  122      Recent Labs  Lab 11/22/2021 1519  INR 1.0      Latest Ref Rng & Units 11/21/2021    4:44 AM 11/21/2021   12:55 AM 11/20/2021   10:13 PM  CMP  Glucose 70 - 99 mg/dL  93  97   BUN 8 - 23 mg/dL  19  20   Creatinine 0.61 - 1.24 mg/dL  1.00  0.95   Sodium 135 - 145 mmol/L 145  145  143   Potassium 3.5 - 5.1 mmol/L 3.7  4.0  4.0   Chloride 98 - 111  mmol/L  106  105   CO2 22 - 32 mmol/L  28  28   Calcium 8.9 - 10.3 mg/dL  8.7  8.8   Total Protein 6.5 - 8.1 g/dL  6.0    Total Bilirubin 0.3 - 1.2 mg/dL  1.1    Alkaline Phos 38 - 126 U/L  122    AST 15 - 41 U/L  29    ALT 0 - 44 U/L  29       Radiologic studies:   Assessment & Plan  Assessment: Chronic dysphagia  with imaging suggesting either EG junction stricture or achalasia  Our initial plan was for an inpatient EGD with possible balloon dilation and/or Botox. However, his current respiratory status precludes safe sedation for an elective endoscopic procedure.  We will sign off for now, please call us when his respiratory status improved sufficiently that he could have an EGD.  (Dr. Lyndel Safe will manage the inpatient consult service starting tomorrow)   Nelida Meuse III Office: [phone]

## 2021-11-21 NOTE — Progress Notes (Signed)
PT Cancellation Note  Patient Details Name: Mark Thomas MRN: [id number] DOB: 22-May-1950   Cancelled Treatment:    Reason Eval/Treat Not Completed: Patient not medically ready  Noted pt continues to require BiPAP (although will not wear it and is on [facility]) and is at risk for intubation due to tenuous respiratory status. Will defer PT evaluation today and follow-up 7/24 if patient medically appropriate.    Arby Barrette, PT Acute Rehabilitation Services  Office [phone]  Rexanne Mano 11/21/2021, 12:37 PM

## 2021-11-21 NOTE — Progress Notes (Signed)
NAME:  Mark Thomas, MRN:  [id number], DOB:  Feb 20, 1951, LOS: 6 ADMISSION DATE:  11/16/2021, CONSULTATION DATE:  11/20/21 REFERRING MD:  Dr elgergawy, CHIEF COMPLAINT:  recurrent pneumonia   History of Present Illness:  71 year old never smoker that we are asked to evaluate for recurrent pneumonia. He was initially seen as a pulmonary consult 09/15/2018 for ILD and recurrent pneumonia.  He subsequently developed left hemi- opacity of his lung and was hospitalized multiple times.  He was treated with prolonged and multiple courses of IV antibiotics.  He required oxygen and was subsequently weaned off.  He is now a resident of [facility] for the last 6 weeks.  Bronchoscopy on 6/7 with BAL was negative for AFB, fungal and bacterial cultures. Swallowing evaluation 07/2021 showed mild aspiration risk and mild dysphagia   Was last hospitalization for 6/20 to 6/27 for recurrent pneumonia with a component of acute diastolic heart failure.  He was diuresed, treated with cefepime and steroids and discharged on 3 L of oxygen. He developed shortness of breath, right-sided chest pain and hypoxia for the last 4 days prior to this admission.  He was started on ceftriaxone and doxycycline.  He was also on Lasix and prednisone .  Chest x-ray 7/13 showed right more than left infiltrate. He was seen by our nurse practitioner on 7/14 for an office visit.  Chest x-ray 7/14 showed asymmetric airspace disease in the right mid and lower lung   He developed subjective fevers and cough and sought ED attention on 7/17 Labs showed leukocytosis 20 K increased from 12, creatinine 1.5, BNP 299, lactate was 2.9 and 2.5. Chest x-ray showed worsening infiltrates on the right in a patchy pattern consistent with bronchopneumonia.  He was started on cefepime and vancomycin and PCCM consulted.   PCCM following peripherally. Called again in the early a.m. of 7/22 after patient became acutely hypoxemic.  Patient awoke and panic with  increased oxygen requirement from 4 L to 15 L, nonrebreather placed.  Transitioned to BiPAP. ABG 7.41/47/137/30.4  Pertinent  Medical History  Atrial fibrillation status post Watchman device CAD status post stent, on Plavix Sick sinus syndrome status post pacemaker RLS, essential tremor HFpEF Chronic respiratory failure with hypoxia CKD stage III  Significant Hospital Events: Including procedures, antibiotic start and stop dates in addition to other pertinent events   CT chest without con 06/2021 patchy groundglass nodules in right upper lobe CTA chest 07/2021 mild peripheral interstitial opacities in lower lung?  ILD, patchy groundglass opacities in bilateral upper lobes. CT chest without con 10/2021 left lung opacities have improved, worsening patchy groundglass opacity throughout the right lung, trace effusions , distal esophageal wall thickening with moderate hiatal hernia. Swallow evaluation/MBS 07/2021 mild aspiration risk, no restriction 7/19 Modified barium swallow. Normal swallowing function. Barium was retained in esophagus and did not pas GE junction. Cefepime>Unasyn 7/22 PCCM called for hypoxia, patient started on BiPAP  Interim History / Subjective:  Transfer to ICU for closer monitoring Occasionally tolerating high flow nasal cannula Does get tachypneic  Objective   Blood pressure 112/70, pulse (!) 103, temperature 98 F (36.7 C), temperature source Oral, resp. rate (!) 34, height _0  (1.854 m), weight 107 kg, SpO2 92 %.    FiO2 (%):  [50 %-60 %] 60 %   Intake/Output Summary (Last 24 hours) at 11/21/2021 0745 Last data filed at 11/21/2021 0700 Gross per 24 hour  Intake 654.03 ml  Output 550 ml  Net 104.03 ml   [employer]  11/19/21 0404 11/19/21 0438 11/20/21 0350  Weight: 106.2 kg 106.7 kg 107 kg    Examination: General: Elderly, increased work of breathing  HENT: Moist oral mucosa Lungs: Some rhonchi Cardiovascular: S1-S2 appreciated Abdomen: Soft, bowel  sounds appreciated Extremities: Warm, DRY Neuro: awake , alert oriented, agitation GU: fair output  Resolved Hospital Problem list     Assessment & Plan:  Acute on chronic hypoxemic respiratory failure -Continue oxygen supplementation as tolerated -BiPAP as needed for increased work of breathing  Intermittent agitation -On Precedex at present  Recurrent pneumonia Aspiration pneumonia -On Unasyn, on fluconazole  Chronic heart failure with preserved ejection fraction Paroxysmal atrial fibrillation Coronary artery disease  History of vascular dementia  History of CVA  Stage IIIa chronic kidney disease with acute kidney injury -Maintain renal perfusion -Trend electrolytes  Patient scheduled for endoscopy -Dr. Corena Pilgrim notes noted -I believe patient will still need some GI intervention-endoscopy -Patient will likely need to be on vent for procedure  Still with significant respiratory compromise Increased work of breathing Pulling off BiPAP mask -Increase Precedex -Dose of Ativan  Unfortunately, he has very very poor reserves, may need to be ventilated if not able to keep BiPAP on  Will continue to monitor  Best Practice (right click and "Reselect all SmartList Selections" daily)   Diet/type: NPO DVT prophylaxis: LMWH GI prophylaxis: PPI Lines: N/A Foley:  N/A Code Status:  full code Last date of multidisciplinary goals of care discussion [pending]  Labs   CBC: Recent Labs  Lab 11/26/2021 1519 11/16/21 0413 11/17/21 0441 11/18/21 0508 11/20/21 1557 11/21/21 0055 11/21/21 0444  WBC 20.0* 11.3* 11.4* 12.3*  --  11.4*  --   NEUTROABS 17.9*  --   --   --   --   --   --   HGB 12.0* 9.8* 9.8* 10.2* 14.3 10.0* 9.9*  HCT 39.4 31.5* 30.3* 31.4* 42.0 31.8* 29.0*  MCV 102.1* 100.0 99.0 97.2  --  99.1  --   PLT 198 120* 146* 145*  --  122*  --     Basic Metabolic Panel: Recent Labs  Lab 11/18/21 0508 11/19/21 0452 11/20/21 0430 11/20/21 1557 11/20/21 2213  11/21/21 0055 11/21/21 0444  NA 139 141 140 143 143 145 145  K 4.4 4.6 4.2 3.9 4.0 4.0 3.7  CL 103 103 102  --  105 106  --   CO2 _0 --  28 28  --   GLUCOSE 120* 114* 137*  --  97 93  --   BUN _1 --  20 19  --   CREATININE 0.92 0.98 1.11  --  0.95 1.00  --   CALCIUM 9.1 9.2 8.9  --  8.8* 8.7*  --   MG  --   --  2.0  --  2.1  --   --    GFR: Estimated Creatinine Clearance: 90.7 mL/min (by C-G formula based on SCr of 1 mg/dL). Recent Labs  Lab 11/19/2021 1554 11/20/2021 1737 11/22/2021 1754 11/16/21 0413 11/17/21 0441 11/18/21 0508 11/20/21 0430 11/21/21 0055  PROCALCITON  --   --  0.52 0.48 0.16  --   --   --   WBC  --   --   --  11.3* 11.4* 12.3*  --  11.4*  LATICACIDVEN 2.9* 2.5*  --   --   --   --  2.1*  --     Liver Function Tests: Recent Labs  Lab 11/02/2021 1519 11/16/21  0413 11/18/21 0508 11/21/21 0055  AST 45* _0 ALT 45* 31 32 29  ALKPHOS 56 43 49 122  BILITOT 0.7 0.5 0.9 1.1  PROT 7.1 5.7* 5.9* 6.0*  ALBUMIN 2.9* 2.3* 2.3* 2.1*   No results for input(s): "LIPASE", "AMYLASE" in the last 168 hours. No results for input(s): "AMMONIA" in the last 168 hours.  ABG    Component Value Date/Time   PHART 7.458 (H) 11/21/2021 0444   PCO2ART 38.4 11/21/2021 0444   PO2ART 72 (L) 11/21/2021 0444   HCO3 27.0 11/21/2021 0444   TCO2 28 11/21/2021 0444   O2SAT 95 11/21/2021 0444     Coagulation Profile: Recent Labs  Lab 11/04/2021 1519  INR 1.0    Cardiac Enzymes: No results for input(s): "CKTOTAL", "CKMB", "CKMBINDEX", "TROPONINI" in the last 168 hours.  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   Surgical History:   Past Surgical History:  Procedure Laterality Date   APPENDECTOMY     ASD REPAIR  01/2020   CARDIAC SURGERY     CHOLECYSTECTOMY, LAPAROSCOPIC  01/2019   with ventral hernia repair, surgical findings of liver between GB and liver.  At [facility]  08/2015   At [facility] small polyps in ascending colon.  Nonbleeding internal hemorrhoids   ESOPHAGOGASTRODUODENOSCOPY  09/2001   In [facility].  Vernie Shanks MD. healed esophageal ulcers.  Healing gastric ulcer.  Gastric polyp removed   ESOPHAGOGASTRODUODENOSCOPY  08/2015   no report, just path from "gastritis": Benign gastric mucosa with superficial vascular congestion   FLEXIBLE BRONCHOSCOPY N/A 10/06/2021   Procedure: FLEXIBLE BRONCHOSCOPY;  Surgeon: Margaretha Seeds, MD;  Location: [facility];  Service: Cardiopulmonary;  Laterality: N/A;   HERNIA REPAIR  01/2019   ventral hernia repair w cholecystectomy.  at [facility]  03/2019   watchman procedure 03/2019   NASAL SINUS SURGERY     SHOULDER SURGERY  2013   R Rot cuff repair. around 2013.     Social History:   reports that he has never smoked. He has never been exposed to tobacco smoke. He has never used smokeless tobacco. He reports that he does not currently use alcohol. He reports that he does not use drugs.   Family History:  His family history includes Hypertension in his father and mother; Stroke in his father and mother.   Allergies Allergies   Allergen Reactions   Dilaudid [Hydromorphone] Other (See Comments)    Respiratory Arrest!!!!   Bactrim [Sulfamethoxazole-Trimethoprim] Rash   Benadryl [Diphenhydramine] Other (See Comments)  Muscle spasms   Phenobarbital Other (See Comments)    From childhood- reaction not recalled at this time   Penicillins Other (See Comments)    From childhood- reaction not recalled at this time     Home Medications  Prior to Admission medications   Medication Sig Start Date End Date Taking? Authorizing Provider  acetaminophen (TYLENOL) 500 MG tablet Take 1,000 mg by mouth 2 (two) times daily. 10/28/21  Yes [provider]  albuterol (PROVENTIL) (2.5 MG/3ML) 0.083% nebulizer solution Take 3 mLs (2.5 mg total) by nebulization every 6 (six) hours as needed for wheezing or shortness of breath. Patient taking differently: Take 2.5 mg by nebulization every 4 (four) hours as needed for shortness of breath. 07/15/21  Yes Luetta Nutting, DO  albuterol (VENTOLIN HFA) 108 (90 Base) MCG/ACT inhaler Inhale 2 puffs into the lungs every 6 (six) hours as needed for wheezing or shortness of breath. 10/26/21  Yes [provider]  allopurinol (ZYLOPRIM) 100 MG tablet Take 100 mg by mouth daily. 09/22/20  Yes [provider]  aspirin 81 MG EC tablet Take 81 mg by mouth daily. 05/05/14  Yes [provider]  atorvastatin (LIPITOR) 80 MG tablet Take 80 mg by mouth daily. 11/18/20  Yes [provider]  b complex vitamins capsule Take 1 capsule by mouth daily.   Yes [provider]  Cholecalciferol (VITAMIN D3) 10 MCG (400 UNIT) CAPS Take 400 Units by mouth in the morning.   Yes [provider]  clopidogrel (PLAVIX) 75 MG tablet Take 75 mg by mouth daily. 11/18/20  Yes [provider]  dextromethorphan-guaiFENesin (MUCINEX DM) 30-600 MG 12hr tablet Take 1 tablet by mouth 2 (two) times daily. 10/26/21  Yes [provider]  escitalopram (LEXAPRO) 20 MG  tablet Take 1 tablet (20 mg total) by mouth daily. Patient taking differently: Take 30 mg by mouth daily. 11/04/21  Yes Merian Capron, MD  ferrous sulfate 325 (65 FE) MG EC tablet Take 1 tablet (325 mg total) by mouth in the morning and at bedtime. Patient taking differently: Take 325 mg by mouth every other day. 03/19/21 11/26/2021 Yes Luetta Nutting, DO  furosemide (LASIX) 20 MG tablet Take 20 mg by mouth every other day. 10/29/21  Yes [provider]  ipratropium-albuterol (DUONEB) 0.5-2.5 (3) MG/3ML SOLN Take 3 mLs by nebulization every 8 (eight) hours. 10/18/21  Yes [provider]  latanoprost (XALATAN) 0.005 % ophthalmic solution Place 1 drop into both eyes at bedtime. 12/07/20  Yes [provider]  lidocaine (XYLOCAINE) 1 % (with preservative) injection 2.1 mLs See admin instructions. Ceftriaxone injection to be mixed with 2.1 mL of lidocaine 1% 11/13/21  Yes [provider]  magnesium oxide (MAG-OX) 400 MG tablet Take 400 mg by mouth daily.   Yes [provider]  memantine (NAMENDA) 10 MG tablet TAKE 1 TABLET TWICE A DAY Patient taking differently: Take 10 mg by mouth 2 (two) times daily. 09/21/21  Yes Alric Ran, MD  Metoprolol Tartrate 37.5 MG TABS Take 37.5 mg by mouth 2 (two) times daily. 10/26/21  Yes Shawna Clamp, MD  midodrine (PROAMATINE) 5 MG tablet Take 0.5 tablets (2.5 mg total) by mouth 3 (three) times daily with meals. 10/14/21  Yes Ghimire, Henreitta Leber, MD  mometasone-formoterol (DULERA) 200-5 MCG/ACT AERO Inhale 2 puffs into the lungs 2 (two) times daily. 09/14/21  Yes Freddi Starr, MD  nitroGLYCERIN (NITROLINGUAL) 0.4 MG/SPRAY spray Place 1 spray under the tongue every 5 (five) minutes x  3 doses as needed for chest pain. 05/05/14  Yes [provider]  oxyCODONE-acetaminophen (PERCOCET/ROXICET) 5-325 MG tablet Take 1 tablet by mouth every 12 (twelve) hours as needed for moderate pain. Patient taking differently: Take 0.5 tablets  by mouth every 12 (twelve) hours. 10/26/21  Yes Shawna Clamp, MD  pantoprazole (PROTONIX) 40 MG tablet Take 1 tablet (40 mg total) by mouth every evening. 07/07/21  Yes Luetta Nutting, DO  polyethylene glycol (MIRALAX / GLYCOLAX) 17 g packet Take 17 g by mouth daily. 17 grams, oral, once a day, bowels-in 6-8 oz water or juice   Yes [provider]  primidone (MYSOLINE) 50 MG tablet Take 2 tablets (100 mg total) by mouth 2 (two) times daily. 08/24/21 11/22/21 Yes Camara, Maryan Puls, MD  Probiotic, Lactobacillus, CAPS Take 1 capsule by mouth daily. Lactobacillus combination no.4, 5 billion cell capsule. Give 1 capsule po daily for 10 days for supplement. 11/12/21 11/21/21 Yes [provider]  QUEtiapine (SEROQUEL) 100 MG tablet Take 100 mg by mouth at bedtime. 10/26/21  Yes [provider]  ranolazine (RANEXA) 1000 MG SR tablet Take 1,000 mg by mouth in the morning and at bedtime.   Yes [provider]  rOPINIRole (REQUIP XL) 4 MG 24 hr tablet Take 1 tablet (4 mg total) by mouth at bedtime. 09/23/21 09/18/22 Yes Alric Ran, MD  Tiotropium Bromide Monohydrate 2.5 MCG/ACT AERS Inhale 2 each into the lungs daily. 11/14/21  Yes [provider]  triamcinolone ointment (KENALOG) 0.1 % Apply 1 application. topically daily as needed (to affected areas- for psoriasis flares). 01/21/21  Yes [provider]  QUEtiapine (SEROQUEL) 50 MG tablet Take 2 tablets (100 mg total) by mouth at bedtime. Patient not taking: Reported on 11/01/2021 11/04/21 02/02/22  Merian Capron, MD  FLUoxetine (PROZAC) 20 MG capsule Take 1 capsule (20 mg total) by mouth daily. 09/21/21 09/28/21  Merian Capron, MD    The patient is critically ill with multiple organ systems failure and requires high complexity decision making for assessment and support, frequent evaluation and titration of therapies, application of advanced monitoring technologies and extensive interpretation of multiple databases.  Critical Care Time devoted to patient care services described in this note independent of APP/resident time (if applicable)  is 35 minutes.   Sherrilyn Rist MD [facility] Personal pager: See Amion If unanswered, please page CCM On-call: [phone]

## 2021-11-21 NOTE — Progress Notes (Signed)
Patient was placed back on BIPAP due to severe respiratory distress with RR mid 40's. After about 5 minutes he ripped Bipap mask off. I placed back on 2 more times & he kept ripping it off screaming NO. I tried to make him understand the importance of wearing it along with Dr Ander Slade at the bedside & he refused to keep mask on. Placed back on [place] at 35L/80%. SpO2 improved to 98%, but still in distress.

## 2021-11-22 DIAGNOSIS — J189 Pneumonia, unspecified organism: Secondary | ICD-10-CM | POA: Diagnosis not present

## 2021-11-22 LAB — CBC
HCT: 28.5 % — ABNORMAL LOW (ref 39.0–52.0)
Hemoglobin: 9 g/dL — ABNORMAL LOW (ref 13.0–17.0)
MCH: 31.4 pg (ref 26.0–34.0)
MCHC: 31.6 g/dL (ref 30.0–36.0)
MCV: 99.3 fL (ref 80.0–100.0)
Platelets: 106 10*3/uL — ABNORMAL LOW (ref 150–400)
RBC: 2.87 MIL/uL — ABNORMAL LOW (ref 4.22–5.81)
RDW: 16.6 % — ABNORMAL HIGH (ref 11.5–15.5)
WBC: 8.2 10*3/uL (ref 4.0–10.5)
nRBC: 0.6 % — ABNORMAL HIGH (ref 0.0–0.2)

## 2021-11-22 LAB — IRON AND TIBC
Iron: 29 ug/dL — ABNORMAL LOW (ref 45–182)
Saturation Ratios: 18 % (ref 17.9–39.5)
TIBC: 160 ug/dL — ABNORMAL LOW (ref 250–450)
UIBC: 131 ug/dL

## 2021-11-22 LAB — BASIC METABOLIC PANEL
Anion gap: 11 (ref 5–15)
BUN: 18 mg/dL (ref 8–23)
CO2: 27 mmol/L (ref 22–32)
Calcium: 8.4 mg/dL — ABNORMAL LOW (ref 8.9–10.3)
Chloride: 110 mmol/L (ref 98–111)
Creatinine, Ser: 0.97 mg/dL (ref 0.61–1.24)
GFR, Estimated: >60 mL/min (ref >60)
Glucose, Bld: 91 mg/dL (ref 70–99)
Potassium: 3.8 mmol/L (ref 3.5–5.1)
Sodium: 148 mmol/L — ABNORMAL HIGH (ref 135–145)

## 2021-11-22 LAB — CREATININE, SERUM
Creatinine, Ser: 0.91 mg/dL (ref 0.61–1.24)
GFR, Estimated: >60 mL/min (ref >60)

## 2021-11-22 LAB — GLUCOSE, CAPILLARY
Glucose-Capillary: 106 mg/dL — ABNORMAL HIGH (ref 70–99)
Glucose-Capillary: 85 mg/dL (ref 70–99)
Glucose-Capillary: 80 mg/dL (ref 70–99)
Glucose-Capillary: 77 mg/dL (ref 70–99)
Glucose-Capillary: 71 mg/dL (ref 70–99)

## 2021-11-22 LAB — TRIGLYCERIDES: Triglycerides: 131 mg/dL (ref <150)

## 2021-11-22 MED ORDER — SACCHAROMYCES BOULARDII 250 MG PO CAPS
250.0000 mg | ORAL_CAPSULE | Freq: Two times a day (BID) | ORAL | Status: DC
  Administered 2021-11-23 – 2021-11-26 (×8): 250 mg
  Filled 2021-11-22 (×12): qty 1

## 2021-11-22 MED ORDER — ORAL CARE MOUTH RINSE
15.0000 mL | OROMUCOSAL | Status: DC
  Administered 2021-11-22 – 2021-11-27 (×61): 15 mL via OROMUCOSAL

## 2021-11-22 MED ORDER — FUROSEMIDE 10 MG/ML IJ SOLN
40.0000 mg | Freq: Once | INTRAMUSCULAR | Status: AC
  Administered 2021-11-22: 40 mg via INTRAVENOUS
  Filled 2021-11-22: qty 4

## 2021-11-22 MED ORDER — ORAL CARE MOUTH RINSE: 15.0000 mL | OROMUCOSAL | Status: DC | PRN

## 2021-11-22 MED ORDER — ALLOPURINOL 100 MG PO TABS
100.0000 mg | ORAL_TABLET | Freq: Every day | ORAL | Status: DC
Start: 2021-11-22 — End: 2021-11-27
  Administered 2021-11-23 – 2021-11-27 (×5): 100 mg
  Filled 2021-11-22 (×5): qty 1

## 2021-11-22 MED ORDER — METOPROLOL TARTRATE 5 MG/5ML IV SOLN
5.0000 mg | Freq: Four times a day (QID) | INTRAVENOUS | Status: DC
Start: 2021-11-22 — End: 2021-11-23
  Administered 2021-11-22: 5 mg via INTRAVENOUS
  Filled 2021-11-22 (×2): qty 5

## 2021-11-22 MED ORDER — PANTOPRAZOLE 2 MG/ML SUSPENSION: 40.0000 mg | Freq: Every day | ORAL | Status: DC

## 2021-11-22 MED ORDER — PANTOPRAZOLE SODIUM 40 MG IV SOLR
40.0000 mg | INTRAVENOUS | Status: DC
Start: 2021-11-23 — End: 2021-11-23

## 2021-11-22 MED ORDER — MEMANTINE HCL 10 MG PO TABS
10.0000 mg | ORAL_TABLET | Freq: Two times a day (BID) | ORAL | Status: DC
  Administered 2021-11-23 – 2021-11-27 (×9): 10 mg
  Filled 2021-11-22 (×12): qty 1

## 2021-11-22 MED ORDER — PRIMIDONE 50 MG PO TABS
100.0000 mg | ORAL_TABLET | Freq: Two times a day (BID) | ORAL | Status: DC
  Administered 2021-11-23 – 2021-11-25 (×5): 100 mg
  Filled 2021-11-22 (×8): qty 2

## 2021-11-22 MED ORDER — METOPROLOL TARTRATE 5 MG/5ML IV SOLN
5.0000 mg | Freq: Four times a day (QID) | INTRAVENOUS | Status: DC
Start: 2021-11-22 — End: 2021-11-22

## 2021-11-22 MED ORDER — PANTOPRAZOLE SODIUM 40 MG IV SOLR: 40.0000 mg | INTRAVENOUS | Status: DC

## 2021-11-22 MED ORDER — MIDODRINE HCL 5 MG PO TABS
2.5000 mg | ORAL_TABLET | Freq: Three times a day (TID) | ORAL | Status: DC
  Filled 2021-11-22 (×2): qty 1

## 2021-11-22 MED ORDER — ASPIRIN 81 MG PO CHEW
81.0000 mg | CHEWABLE_TABLET | Freq: Every day | ORAL | Status: DC
  Administered 2021-11-23 – 2021-11-25 (×3): 81 mg
  Filled 2021-11-22 (×3): qty 1

## 2021-11-22 MED ORDER — ATORVASTATIN CALCIUM 40 MG PO TABS
80.0000 mg | ORAL_TABLET | Freq: Every day | ORAL | Status: DC
Start: 2021-11-22 — End: 2021-11-27
  Administered 2021-11-23 – 2021-11-26 (×4): 80 mg
  Filled 2021-11-22 (×4): qty 2

## 2021-11-22 MED ORDER — DEXTROSE 10 % IV SOLN: INTRAVENOUS | Status: DC

## 2021-11-22 MED ORDER — QUETIAPINE FUMARATE 100 MG PO TABS
100.0000 mg | ORAL_TABLET | Freq: Every day | ORAL | Status: DC
Start: 2021-11-22 — End: 2021-11-27
  Administered 2021-11-23 – 2021-11-26 (×4): 100 mg
  Filled 2021-11-22 (×4): qty 1

## 2021-11-22 MED ORDER — METOPROLOL TARTRATE 5 MG/5ML IV SOLN
2.5000 mg | Freq: Four times a day (QID) | INTRAVENOUS | Status: DC
Start: 2021-11-22 — End: 2021-11-22
  Administered 2021-11-22: 2.5 mg via INTRAVENOUS
  Filled 2021-11-22: qty 5

## 2021-11-22 MED ORDER — ESCITALOPRAM OXALATE 10 MG PO TABS
20.0000 mg | ORAL_TABLET | Freq: Every day | ORAL | Status: DC
Start: 2021-11-22 — End: 2021-11-27
  Administered 2021-11-23 – 2021-11-27 (×5): 20 mg
  Filled 2021-11-22 (×5): qty 2

## 2021-11-22 NOTE — Progress Notes (Addendum)
Daily Rounding Note  11/22/2021, 11:01 AM  LOS: 7 days   SUBJECTIVE:   Chief complaint:   Dysphagia, question GE junction stricture  Patient is now intubated on vent and CCM calling GI back for possible pursuing of EGD that was canceled for today due to respiratory distress.  OBJECTIVE:         Vital signs in last 24 hours:    Temp:  [98.4 F (36.9 C)-99.1 F (37.3 C)] 99.1 F (37.3 C) (07/24 0738) Pulse Rate:  [73-109] 95 (07/24 1057) Resp:  [13-36] 26 (07/24 1057) BP: (88-197)/(56-125) 114/70 (07/24 1000) SpO2:  [93 %-100 %] 94 % (07/24 1057) FiO2 (%):  [60 %-70 %] 60 % (07/24 1057) Weight:  [105 kg] 105 kg (07/24 0500) Last BM Date : 11/20/21 Filed Weights   11/19/21 0438 11/20/21 0350 11/22/21 0500  Weight: 106.7 kg 107 kg 105 kg   General: Intubated.  Looks ill. Heart: Tacky in the 120s.  Harsh murmur. Chest: Intubated Abdomen: Soft, not tender.  Obese.  Bowel sounds active. Neuro/Psych: Sedated on vent, did not respond to physical exam.  Intake/Output from previous day: 07/23 0701 - 07/24 0700 In: 2571.9 [I.V.:1634.5; IV Piggyback:937.4] Out: 550 [Urine:550]  Intake/Output this shift: Total I/O In: 285.2 [I.V.:208.8; IV Piggyback:76.4] Out: -   Lab Results: Recent Labs    11/21/21 0055 11/21/21 0444 11/21/21 1752 11/22/21 0601  WBC 11.4*  --   --  8.2  HGB 10.0* 9.9* 12.6* 9.0*  HCT 31.8* 29.0* 37.0* 28.5*  PLT 122*  --   --  106*   BMET Recent Labs    11/20/21 2213 11/21/21 0055 11/21/21 0444 11/21/21 1752 11/22/21 0601  NA 143 145 145 145 148*  K 4.0 4.0 3.7 4.4 3.8  CL 105 106  --   --  110  CO2 28 28  --   --  27  GLUCOSE 97 93  --   --  91  BUN 20 19  --   --  18  CREATININE 0.95 1.00  --   --  0.97  0.91  CALCIUM 8.8* 8.7*  --   --  8.4*   LFT Recent Labs    11/21/21 0055  PROT 6.0*  ALBUMIN 2.1*  AST 29  ALT 29  ALKPHOS 122  BILITOT 1.1   PT/INR No results  for input(s): "LABPROT", "INR" in the last 72 hours. Hepatitis Panel No results for input(s): "HEPBSAG", "HCVAB", "HEPAIGM", "HEPBIGM" in the last 72 hours.  Studies/Results: DG CHEST PORT 1 VIEW  Result Date: 11/21/2021 CLINICAL DATA:  Intubation. EXAM: PORTABLE CHEST 1 VIEW COMPARISON:  11/21/2021 at 5:03 a.m., and older exams. FINDINGS: Endotracheal tube tip projects 4.2 cm above the carina. Bilateral interstitial airspace lung opacities, greater on the right, without significant change from the earlier study. No new lung abnormalities. No pneumothorax. IMPRESSION: 1. Endotracheal tube tip projects 4.2 cm above the carina. 2. Persistent lung opacities. No other change from the earlier exam. Electronically Signed   By: Lajean Manes M.D.   On: 11/21/2021 16:31   DG Chest Port 1 View  Result Date: 11/21/2021 CLINICAL DATA:  Shortness of breath. EXAM: PORTABLE CHEST 1 VIEW COMPARISON:  One-view chest x-ray 11/20/2021 FINDINGS: The heart is enlarged. Pacing wires are stable. Diffuse interstitial and airspace opacities have increased. Bilateral effusions are present. IMPRESSION: Cardiomegaly with increasing interstitial and airspace opacities differential diagnosis includes infection, edema and ARDS. Electronically Signed  By: San Morelle M.D.   On: 11/21/2021 06:01    Scheduled Meds:  allopurinol  100 mg Per Tube Daily   aspirin  81 mg Per Tube Daily   atorvastatin  80 mg Per Tube Daily   Chlorhexidine Gluconate Cloth  6 each Topical Daily   docusate  100 mg Per Tube BID   enoxaparin (LOVENOX) injection  40 mg Subcutaneous Q24H   escitalopram  20 mg Per Tube Daily   furosemide  40 mg Intravenous Once   latanoprost  1 drop Both Eyes QHS   memantine  10 mg Per Tube BID   midodrine  2.5 mg Per Tube TID WC   mouth rinse  15 mL Mouth Rinse Q2H   oxyCODONE-acetaminophen  1 tablet Oral Q8H   pantoprazole (PROTONIX) IV  40 mg Intravenous Q12H   polyethylene glycol  17 g Per Tube Daily    primidone  100 mg Per Tube BID   QUEtiapine  100 mg Per Tube QHS   ranolazine  1,000 mg Oral BID   rOPINIRole  4 mg Oral QHS   saccharomyces boulardii  250 mg Per Tube BID   sodium chloride flush  3 mL Intravenous Q12H   Continuous Infusions:  ampicillin-sulbactam (UNASYN) IV 200 mL/hr at 11/22/21 1000   fentaNYL infusion INTRAVENOUS 100 mcg/hr (11/22/21 1000)   fluconazole (DIFLUCAN) IV Stopped (11/21/21 1331)   lactated ringers 50 mL/hr at 11/22/21 1000   propofol (DIPRIVAN) infusion 15 mcg/kg/min (11/22/21 1103)   PRN Meds:.acetaminophen **OR** acetaminophen, albuterol, benzonatate, chlorpheniramine-HYDROcodone, fentaNYL, fentaNYL (SUBLIMAZE) injection, fentaNYL (SUBLIMAZE) injection, guaiFENesin-dextromethorphan, LORazepam, metoprolol tartrate, mouth rinse, polyethylene glycol    ASSESMENT:   Dysphagia.  11/17/2021 barium esophagram: Limited study but showed nonpassage of barium tablet at a markedly narrowed GE junction, esophageal dysmotility, moderate HH.  Plans for EGD 7/24 were canceled due to progressive respiratory distress.  Recurrent aspiration pneumonias.  Progressive respiratory distress requiring intubation on 7/23.  On Unasyn  Elevated troponin Is.  162 two days ago, 446 yesterday.    Avinger anemia.  Baseline Hb anywhere from 9-10.5, range during this admission has been as high as 12.6, as low as 9.  No PRBCs to date.  On oral iron PTA.      PVD.  CT of 08/26/2021 showed hemodynamically significant celiac axis, IMA and suspected renal artery stenosis.  Plavix P'TA, held as of last week.    Mild hepatic steatosis per CT in late April 2023.    Dementia.     PLAN     Switch PPI to per tube.  Decision regarding timing of EGD and esophageal dilatation per Dr. Lyndel Safe, this will not be happening today.  Okay to begin tube feeds.    Azucena Freed  11/22/2021, 11:01 AM Phone [phone]     Attending physician's note   I have taken reviewed the chart and examined the  patient. I performed a substantive portion of this encounter, including complete performance of at least one of the key components, in conjunction with the APP. I agree with the Advanced Practitioner's note, impression and recommendations.   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 Pl keep him intubated until then. -Continue IV protonix. -D/W risks and benefits with wife in detail including small but definite risks of perforation, bleeding.   Carmell Austria, MD [facility] [phone]

## 2021-11-22 NOTE — Progress Notes (Signed)
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   Essential tremor    Gait disorder 11/2020   GERD (gastroesophageal reflux disease)    High cholesterol    Hypertension    Myocardial infarct ([facility])    Orthostatic hypotension    SSS (sick sinus syndrome) ([place])    dual chamber pacemaker placed, date unknown   Stroke ([place]) 02/02/2021   multiple   Vascular dementia ([place])      Objective: Vitals:   11/22/21 0545 11/22/21 0600 11/22/21 0715 11/22/21 0738  BP: 100/63 114/75    Pulse: 91 98 89   Resp: (!) 22 (!) 24 (!) 26   Temp:    99.1 F (37.3 C)  TempSrc:    Axillary  SpO2: 94% 94% 94%   Weight:      Height:       Vent Mode: PRVC FiO2 (%):  [60 %-80 %] 70 % Set Rate:  [22 bmp-26 bmp] 26 bmp Vt Set:  [480 mL] 480 mL PEEP:  [5 cmH20] 5 cmH20 Plateau Pressure:  [25 cmH20-30 cmH20] 30 cmH20  Intake/Output Summary (Last 24 hours) at 11/22/2021 0916 Last data filed at 11/22/2021 0500 Gross per 24 hour  Intake 2338.95 ml  Output 550 ml  Net 1788.95 ml    General:  In bed on vent HENT: NCAT ETT in place PULM: Rhonchi B, vent supported breathing CV: RRR, no mgr GI: BS+, soft, nontender MSK: normal bulk and tone Neuro: sedated on vent    CBC    Component Value Date/Time   WBC 11.4 (H) 11/21/2021 0055   RBC 3.21 (L) 11/21/2021 0055   HGB 12.6 (L) 11/21/2021 1752   HCT 37.0 (L) 11/21/2021 1752   PLT 122 (L) 11/21/2021 0055   MCV 99.1 11/21/2021 0055   MCH 31.2  11/21/2021 0055   MCHC 31.4 11/21/2021 0055   RDW 16.5 (H) 11/21/2021 0055   LYMPHSABS 0.7 11/12/2021 1519   MONOABS 1.2 (H) 11/14/2021 1519   EOSABS 0.0 11/24/2021 1519   BASOSABS 0.0 11/20/2021 1519    BMET    Component Value Date/Time   NA 145 11/21/2021 1752   NA 137 03/29/2021 1158   K 4.4 11/21/2021 1752   CL 106 11/21/2021 0055   CO2 28 11/21/2021 0055   GLUCOSE 93 11/21/2021 0055   BUN 19 11/21/2021 0055   BUN 23 03/29/2021 1158   CREATININE 0.91 11/22/2021 0601   CREATININE 0.99 09/28/2021 0000   CALCIUM 8.7 (L) 11/21/2021 0055   GFRNONAA >60 11/22/2021 0601    CXR images personally reviewed, airspace disease throughout right lung, some on left, ETT in place dual lead pacemaker   Impression/Plan: Acute respiratory failure with hypoxemia due to aspiration pneumonia, could have ARDS > continue full vent support, daily SBT/WUA; give one dose lasix today and monitor CXR in the AM, continue lung protective ventilation  Aspiration pneumonia due to unspecified organism > continue unasyn 7 days  Thrush> fluconazole 3 days  Esophageal narrowing noted on imaging> reconsult GI  medicine today to consider EGD to assess  Pulmonary hypertension> likely WHO group 2/3, diurese today  Rest per resident note  My cc time 31 minutes  Roselie Awkward, MD [facility] Pager: [phone] Cell: [phone] After 7pm: [phone]

## 2021-11-22 NOTE — Progress Notes (Addendum)
NAME:  Mark Thomas, MRN:  [id number], DOB:  1951-03-31, LOS: 7 ADMISSION DATE:  11/03/2021, CONSULTATION DATE:  11/22/2021  REFERRING MD:  Waldron Labs, TRH, CHIEF COMPLAINT: Recurrent pneumonia  History of Present Illness:  71 year old never smoker that we are asked to evaluate for recurrent pneumonia. He was initially seen as a pulmonary consult 09/15/2018 for ILD and recurrent pneumonia.  He subsequently developed left hemi- opacity of his lung and was hospitalized multiple times.  He was treated with prolonged and multiple courses of IV antibiotics.  He required oxygen and was subsequently weaned off.  He is now a resident of [facility] rehab for the last 6 weeks.  Bronchoscopy on 6/7 with BAL was negative for AFB, fungal and bacterial cultures. Swallowing evaluation 07/2021 showed mild aspiration risk and mild dysphagia  Was last hospitalization for 6/20 to 6/27 for recurrent pneumonia with a component of acute diastolic heart failure.  He was diuresed, treated with cefepime and steroids and discharged on 3 L of oxygen. He developed shortness of breath, right-sided chest pain and hypoxia for the last 4 days prior to this admission.  He was started on ceftriaxone and doxycycline.  He was also on Lasix and prednisone .  Chest x-ray 7/13 showed right more than left infiltrate. He was seen by our nurse practitioner on 7/14 for an office visit.  Chest x-ray 7/14 showed asymmetric airspace disease in the right mid and lower lung  He developed subjective fevers and cough and sought ED attention on 7/17 Labs showed leukocytosis 20 K increased from 12, creatinine 1.5, BNP 299, lactate was 2.9 and 2.5. Chest x-ray showed worsening infiltrates on the right in a patchy pattern consistent with bronchopneumonia.  He was started on cefepime and vancomycin and PCCM consulted.  PCCM following peripherally. Called again in the early a.m. of 7/22 after patient became acutely hypoxemic.  Patient awoke and panic with  increased oxygen requirement from 4 L to 15 L, nonrebreather placed.  Transitioned to BiPAP. ABG 7.41/47/137/30.4  Pertinent  Medical History  Atrial fibrillation status post Watchman device CAD status post stent, on Plavix Sick sinus syndrome status post pacemaker RLS, essential tremor HFpEF Chronic respiratory failure with hypoxia CKD stage III  Significant Hospital Events: Including procedures, antibiotic start and stop dates in addition to other pertinent events   CT chest without con 06/2021 patchy groundglass nodules in right upper lobe CTA chest 07/2021 mild peripheral interstitial opacities in lower lung?  ILD, patchy groundglass opacities in bilateral upper lobes. CT chest without con 10/2021 left lung opacities have improved, worsening patchy groundglass opacity throughout the right lung, trace effusions , distal esophageal wall thickening with moderate hiatal hernia. Swallow evaluation/MBS 07/2021 mild aspiration risk, no restriction 7/19 Modified barium swallow. Normal swallowing function. Barium was retained in esophagus and did not pas GE junction. Cefepime>Unasyn 7/22 PCCM called for hypoxia, patient started on BiPAP    Significant studies: 7/17>> CXR: Worsening patchy right-sided opacities 7/17>> CT chest: Worsening patchy opacities in the right lung-overall improvement in left lung opacities. 7/20>> barium esophagogram: Marked narrowed appearance of distal esophagus   Significant microbiology data: 6/07>> BAL culture: Negative  6/07>> BAL AFB smear: Negative 6/07>> BAL AFB culture: Pending 6/07>> BAL fungal culture: Negative 6/07>> BAL cytology: No malignant cells identified 7/17>> COVID PCR: Negative 7/17>> blood culture: Negative   Procedures: 6/07>> bronchoscopy   Interim History / Subjective:  Intubated and sedated. No enteral access.  Objective: Afebrile, MAP>65  Blood pressure 114/75, pulse 98, temperature 99  F (37.2 C), temperature source Axillary,  resp. rate (!) 24, height 6' 1" (1.854 m), weight 105 kg, SpO2 94 %.    Vent Mode: PRVC FiO2 (%):  [50 %-80 %] 70 % Set Rate:  [22 bmp-26 bmp] 26 bmp Vt Set:  [480 mL] 480 mL PEEP:  [5 cmH20] 5 cmH20 Plateau Pressure:  [25 cmH20-29 cmH20] 25 cmH20   Intake/Output Summary (Last 24 hours) at 11/22/2021 0715 Last data filed at 11/22/2021 0500 Gross per 24 hour  Intake 2454.09 ml  Output 550 ml  Net 1904.09 ml    Filed Weights   11/19/21 0438 11/20/21 0350 11/22/21 0500  Weight: 106.7 kg 107 kg 105 kg   Intake: 2507 cc Urine: 550 cc Output: 550 cc Net: +1.95 L  Examination:  General: elderly male, diaphoretic, intubated and sedated.  HEENT: intubated and sedated Neuro: opens eye but not following commands CV: tachycardic, warm extremities PULM: rhonchi present diffusely GI: Diminished bowel sounds, soft abdomen Extremities: no asymmetry  Skin: no lesions on skin  Labs: Labs show WBC at 8.2 k, Hgb at 9.0, MCV 99.3. Plt 106. BMP shows Na 148, K at 3.8, Cr 0.97, and Ca at 8.4. Chest x-ray: Worsening opacities bilaterally, right worse than left  Resolved Hospital Problem list    Assessment & Plan:  Acute on chronic respiratory failure with hypoxia Recurrent pneumonia.   On Unasyn and Fluconazole. CBC yesterday showed stable leukocytosis at 11.4 k improved to 8.2k this am. Anemia stable around 9. Concern present for ARDS. Will give trial of lasix to see if it helps with respiratory status given hx of HFpEF.  -Continue Unasyn for PNA  -Lasix 40 mg IV once. Follow up CXR tomorrow am.  -Continue vent support with volume and pressure protection given suspicion of ARDS.  -CTM  Anemia  Hgb at 9.0. Has multiple illness recently which are playing a role. MCV 99. Given gradual decline, could have iron deficiency playing a role with concomitant RF16 or folic acid deficiency.  -Check iron studies -Monitor hgb, and transfuse as needed  Chronic HFpEF History of SVT History of PAF  status post watchman's device (03/22/2019) History of CAD status post multiple PCI's History of ASD (status post closure) Moderate AS Chronic. On metoprolol 37.5 mg p.o. twice daily. Not on AC due to Watchman device placement, on plavix and aspirin. All meds held due to pending palcement of enteral access.  -Goal K above 4, goal MG above 2  History of vascular dementia History of CVA -Continue Namenda and Seroquel on pt has enteral access -On aspirin, statin, continue both once able.  GERD -Continue IV PPI  Constipation Last bowel movement 07/22.   -Continue bowel regimen and escalate as needed  Oral thrush -Continue IV Diflucan  Best Practice (right click and "Reselect all SmartList Selections" daily)   Diet/type: NPO w/ oral meds DVT prophylaxis: LMWH GI prophylaxis: PPI Lines: N/A Continuous: 50 cc LR, 100 mcg Fentanyl, 15 mcg Propofol Foley:  N/A Code Status:  full code Last date of multidisciplinary goals of care discussion [11/22/21]  Idamae Schuller, MD [facility]ie Rung. [facility] Internal Medicine Residency, PGY-2  Please see [email or web address] for pager details  If no response, please call [phone] After hours, please call Elink at [phone]

## 2021-11-22 NOTE — Progress Notes (Signed)
eLink Physician-Brief Progress Note Patient Name: Mark Thomas DOB: [birth year] MRN: [id number]   Date of Service  11/22/2021  HPI/Events of Note  Hypoglycemia - Blood glucose = 71. Patient NPO for EGD in AM.   eICU Interventions  Plan: D10W to run IV at 40 mL/hour.      Intervention Category Major Interventions: Other:  [name] 11/22/2021, 11:27 PM

## 2021-11-22 NOTE — Progress Notes (Signed)
PT Cancellation Note  Patient Details Name: Mark Thomas MRN: [id number] DOB: Dec 23, 1950   Cancelled Treatment:    Reason Eval/Treat Not Completed: Patient not medically ready.  Pt intubated and sedated as of yesterday. Chat text with RN about holding on pt today and she agreed we should hold. 11/22/2021  Mark Carne., PT Acute Rehabilitation Services [phone]  (pager) [phone]  (office)   Mark Thomas 11/22/2021, 11:00 AM

## 2021-11-22 NOTE — Progress Notes (Signed)
OT Cancellation Note  Patient Details Name: Mark Thomas MRN: [id number] DOB: [birth year]   Cancelled Treatment:    Reason Eval/Treat Not Completed: Medical issues which prohibited therapy. Pt intubated and sedated as of yesterday. Chat text with RN about holding on pt today and she agreed we should hold.  Golden Circle, OTR/L Acute Rehab Services [facility] [phone] Office [phone]    Almon Register 11/22/2021, 8:06 AM

## 2021-11-22 NOTE — Progress Notes (Signed)
Initial Nutrition Assessment  DOCUMENTATION CODES:   Non-severe (moderate) malnutrition in context of chronic illness  INTERVENTION:  When enteral tube in place, recommend the following TF regimen: Osmolite 1.5 @ 105m/h (15615md) Start at 2524m and advance by 33m11mh to goal as pt is a high refeeding risk Prosource TF TID (40kcal and 11g of protein per packet) This regimen will provide 2460 kcal, 131g of protein, and 1189mL28mfree water. If MD requests free water flushes, recommend 150mL 58mwhich will provide an additional 900mL/d21mis at high risk for refeeding, monitor and replace Mg and phosphorus as needed. 100mg of62mamine x 5 days for for risk of refeeding  NUTRITION DIAGNOSIS:   Moderate Malnutrition (in the context of chronic illness) related to poor appetite as evidenced by mild muscle depletion, mild fat depletion, percent weight loss.  GOAL:   Patient will meet greater than or equal to 90% of their needs  MONITOR:   Weight trends, TF tolerance, Vent status, Labs, I & O's  REASON FOR ASSESSMENT:   Ventilator    ASSESSMENT:   Pt with hx of dementia, CAD, CKD3, gout, GERD, HTN, HLD, prior strokes, prior MI and dysphagia presented to ED from rehab facility with SOB and pneumonia. Recently admitted with similar work up.  7/19 - MBS, showed barium pill not passing through GE junction. 7/19 - Barium esophagram, showed narrowed distal esophagus/GE junction. 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 Additionally there is esophageal dysmotility and decreased peristalsis and moderate hiatal hernia  Patient is currently intubated on ventilator support. No enteral access at this time.   MD reports GI to perform esophageal dilation and hopefully place tube at that time. Advised cortrak tube could also be placed if needed after procedure. Noted GI plans for this 7/25. Will monitor.   Wife at bedside able to provide a nutrition hx. States that since the beginning of  June, pt has spent a significant amount of time hospitalized. In between hospitalizations, pt has been at a rehab facility. Wife reports worsening intake and appetite. Weight loss of >20 lbs since early June.   MV: 13 L/min Temp (24hrs), Avg:98.7 F (37.1 C), Min:98.1 F (36.7 C), Max:99.2 F (37.3 C)  Propofol: 9.63 ml/hr (254kcal/d)   Intake/Output Summary (Last 24 hours) at 11/22/2021 2044 Last data filed at 11/22/2021 1700 Gross per 24 hour  Intake 1646.04 ml  Output 2000 ml  Net -353.96 ml  Net IO Since Admission: 3,948.11 mL [11/22/21 2044]  Nutritionally Relevant Medications: Scheduled Meds:  atorvastatin  80 mg Per Tube Daily   docusate  100 mg Per Tube BID   memantine  10 mg Per Tube BID   polyethylene glycol  17 g Per Tube Daily   saccharomyces boulardii  250 mg Per Tube BID   Continuous Infusions:  fentaNYL infusion INTRAVENOUS 100 mcg/hr (11/22/21 1200)   fluconazole (DIFLUCAN) IV 50 mL/hr at 11/22/21 1200   propofol (DIPRIVAN) infusion 15 mcg/kg/min (11/22/21 1200)   PRN Meds: polyethylene glycol  Labs Reviewed: Na 148 CBG ranges from 77-161 mg/dL over the last 24 hours  NUTRITION - FOCUSED PHYSICAL EXAM:  Flowsheet Row Most Recent Value  Orbital Region Mild depletion  Upper Arm Region No depletion  Thoracic and Lumbar Region Mild depletion  Buccal Region Mild depletion  Temple Region Mild depletion  Clavicle Bone Region Mild depletion  Clavicle and Acromion Bone Region Mild depletion  Scapular Bone Region No depletion  Dorsal Hand Unable to assess  Patellar Region Mild depletion  Anterior Thigh Region Mild depletion  Posterior Calf Region Mild depletion  Edema (RD Assessment) Mild  Hair Reviewed  Eyes Unable to assess  Mouth Unable to assess  Skin Reviewed  [scattered bruising noted, Petechiae to the right arm]  Nails Unable to assess    Diet Order:   Diet Order             Diet NPO time specified Except for: Sips with Meds  Diet effective  now                   EDUCATION NEEDS:   Not appropriate for education at this time  Skin:  Skin Assessment: Reviewed RN Assessment  Last BM:  7/22  Height:   Ht Readings from Last 1 Encounters:  11/21/21 _0  (1.854 m)    Weight:   Wt Readings from Last 1 Encounters:  11/22/21 105 kg    Ideal Body Weight:  83.6 kg  BMI:  Body mass index is 30.54 kg/m.  Estimated Nutritional Needs:   Kcal:  2400-2600 kcal/d  Protein:  120-140g/d  Fluid:  2.4L/d    Ranell Patrick, RD, LDN Clinical Dietitian RD pager # available in [place]  After hours/weekend pager # available in [facility]

## 2021-11-23 ENCOUNTER — Encounter (HOSPITAL_COMMUNITY): Admission: EM | Disposition: E | Payer: Self-pay | Source: Skilled Nursing Facility | Attending: Pulmonary Disease

## 2021-11-23 ENCOUNTER — Encounter (HOSPITAL_COMMUNITY): Payer: Self-pay | Admitting: Internal Medicine

## 2021-11-23 ENCOUNTER — Inpatient Hospital Stay (HOSPITAL_COMMUNITY): Payer: Medicare Other

## 2021-11-23 DIAGNOSIS — J189 Pneumonia, unspecified organism: Secondary | ICD-10-CM | POA: Diagnosis not present

## 2021-11-23 DIAGNOSIS — R1310 Dysphagia, unspecified: Secondary | ICD-10-CM

## 2021-11-23 DIAGNOSIS — K2970 Gastritis, unspecified, without bleeding: Secondary | ICD-10-CM

## 2021-11-23 DIAGNOSIS — K222 Esophageal obstruction: Secondary | ICD-10-CM

## 2021-11-23 DIAGNOSIS — E440 Moderate protein-calorie malnutrition: Secondary | ICD-10-CM | POA: Insufficient documentation

## 2021-11-23 HISTORY — PX: BALLOON DILATION: SHX5330

## 2021-11-23 HISTORY — PX: ESOPHAGOGASTRODUODENOSCOPY: SHX5428

## 2021-11-23 HISTORY — PX: BIOPSY: SHX5522

## 2021-11-23 LAB — CBC WITH DIFFERENTIAL/PLATELET
Abs Immature Granulocytes: 0.21 10*3/uL — ABNORMAL HIGH (ref 0.00–0.07)
Basophils Absolute: 0 10*3/uL (ref 0.0–0.1)
Basophils Relative: 1 %
Eosinophils Absolute: 0.2 10*3/uL (ref 0.0–0.5)
Eosinophils Relative: 2 %
HCT: 28.2 % — ABNORMAL LOW (ref 39.0–52.0)
Hemoglobin: 8.8 g/dL — ABNORMAL LOW (ref 13.0–17.0)
Immature Granulocytes: 3 %
Lymphocytes Relative: 17 %
Lymphs Abs: 1.3 10*3/uL (ref 0.7–4.0)
MCH: 31.2 pg (ref 26.0–34.0)
MCHC: 31.2 g/dL (ref 30.0–36.0)
MCV: 100 fL (ref 80.0–100.0)
Monocytes Absolute: 0.5 10*3/uL (ref 0.1–1.0)
Monocytes Relative: 6 %
Neutro Abs: 5.5 10*3/uL (ref 1.7–7.7)
Neutrophils Relative %: 71 %
Platelets: 84 10*3/uL — ABNORMAL LOW (ref 150–400)
RBC: 2.82 MIL/uL — ABNORMAL LOW (ref 4.22–5.81)
RDW: 16.9 % — ABNORMAL HIGH (ref 11.5–15.5)
WBC: 7.6 10*3/uL (ref 4.0–10.5)
nRBC: 0.5 % — ABNORMAL HIGH (ref 0.0–0.2)

## 2021-11-23 LAB — BASIC METABOLIC PANEL
Anion gap: 10 (ref 5–15)
BUN: 21 mg/dL (ref 8–23)
CO2: 28 mmol/L (ref 22–32)
Calcium: 8.4 mg/dL — ABNORMAL LOW (ref 8.9–10.3)
Chloride: 108 mmol/L (ref 98–111)
Creatinine, Ser: 1.13 mg/dL (ref 0.61–1.24)
GFR, Estimated: >60 mL/min (ref >60)
Glucose, Bld: 94 mg/dL (ref 70–99)
Potassium: 3.9 mmol/L (ref 3.5–5.1)
Sodium: 146 mmol/L — ABNORMAL HIGH (ref 135–145)

## 2021-11-23 LAB — GLUCOSE, CAPILLARY
Glucose-Capillary: 94 mg/dL (ref 70–99)
Glucose-Capillary: 92 mg/dL (ref 70–99)
Glucose-Capillary: 100 mg/dL — ABNORMAL HIGH (ref 70–99)
Glucose-Capillary: 122 mg/dL — ABNORMAL HIGH (ref 70–99)
Glucose-Capillary: 125 mg/dL — ABNORMAL HIGH (ref 70–99)
Glucose-Capillary: 147 mg/dL — ABNORMAL HIGH (ref 70–99)

## 2021-11-23 LAB — RETICULOCYTES
Immature Retic Fract: 29 % — ABNORMAL HIGH (ref 2.3–15.9)
RBC.: 2.86 MIL/uL — ABNORMAL LOW (ref 4.22–5.81)
Retic Count, Absolute: 72.3 10*3/uL (ref 19.0–186.0)
Retic Ct Pct: 2.5 % (ref 0.4–3.1)

## 2021-11-23 LAB — MAGNESIUM: Magnesium: 2 mg/dL (ref 1.7–2.4)

## 2021-11-23 SURGERY — EGD (ESOPHAGOGASTRODUODENOSCOPY)
Anesthesia: Moderate Sedation

## 2021-11-23 MED ORDER — MIDAZOLAM HCL 2 MG/2ML IJ SOLN
6.0000 mg | Freq: Once | INTRAMUSCULAR | Status: AC
  Administered 2021-11-23: 2 mg via INTRAVENOUS
  Filled 2021-11-23: qty 6

## 2021-11-23 MED ORDER — OSMOLITE 1.5 CAL PO LIQD
1000.0000 mL | ORAL | Status: DC
  Administered 2021-11-23 – 2021-11-26 (×5): 1000 mL
  Filled 2021-11-23 (×7): qty 1000

## 2021-11-23 MED ORDER — FENTANYL BOLUS VIA INFUSION
200.0000 ug | Freq: Once | INTRAVENOUS | Status: AC
  Administered 2021-11-23: 100 ug via INTRAVENOUS
  Filled 2021-11-23: qty 200

## 2021-11-23 MED ORDER — PROSOURCE TF PO LIQD
45.0000 mL | Freq: Three times a day (TID) | ORAL | Status: DC
  Administered 2021-11-23 – 2021-11-27 (×12): 45 mL
  Filled 2021-11-23 (×11): qty 45

## 2021-11-23 MED ORDER — ETOMIDATE 2 MG/ML IV SOLN
20.0000 mg | Freq: Once | INTRAVENOUS | Status: AC
  Administered 2021-11-23: 20 mg via INTRAVENOUS
  Filled 2021-11-23: qty 10

## 2021-11-23 MED ORDER — FENTANYL CITRATE (PF) 100 MCG/2ML IJ SOLN
INTRAMUSCULAR | Status: AC
  Filled 2021-11-23: qty 4

## 2021-11-23 MED ORDER — MIDAZOLAM HCL (PF) 5 MG/ML IJ SOLN
INTRAMUSCULAR | Status: AC
  Filled 2021-11-23: qty 2

## 2021-11-23 MED ORDER — FUROSEMIDE 10 MG/ML IJ SOLN
40.0000 mg | Freq: Four times a day (QID) | INTRAMUSCULAR | Status: AC
  Administered 2021-11-23 (×2): 40 mg via INTRAVENOUS
  Filled 2021-11-23 (×2): qty 4

## 2021-11-23 MED ORDER — PANTOPRAZOLE 2 MG/ML SUSPENSION
40.0000 mg | Freq: Every day | ORAL | Status: DC
Start: 2021-11-23 — End: 2021-11-27
  Administered 2021-11-23 – 2021-11-26 (×4): 40 mg
  Filled 2021-11-23 (×5): qty 20

## 2021-11-23 MED ORDER — METOPROLOL TARTRATE 25 MG/10 ML ORAL SUSPENSION
12.5000 mg | Freq: Two times a day (BID) | ORAL | Status: DC
  Administered 2021-11-23 – 2021-11-24 (×3): 12.5 mg
  Filled 2021-11-23 (×3): qty 5

## 2021-11-23 MED ORDER — FENTANYL CITRATE PF 50 MCG/ML IJ SOSY: 200.0000 ug | PREFILLED_SYRINGE | Freq: Once | INTRAMUSCULAR | Status: DC

## 2021-11-23 MED ORDER — CLOPIDOGREL BISULFATE 75 MG PO TABS
75.0000 mg | ORAL_TABLET | Freq: Every day | ORAL | Status: DC
  Administered 2021-11-23 – 2021-11-27 (×5): 75 mg
  Filled 2021-11-23 (×5): qty 1

## 2021-11-23 NOTE — Progress Notes (Signed)
NAME:  DEMONTEZ NOVACK, MRN:  [id number], DOB:  [birth year], LOS: 8 ADMISSION DATE:  11/09/2021, CONSULTATION DATE:  11/17/2021  REFERRING MD:  Waldron Labs, TRH, CHIEF COMPLAINT: Recurrent pneumonia  History of Present Illness:  71 year old never smoker that we are asked to evaluate for recurrent pneumonia. He was initially seen as a pulmonary consult 09/15/2018 for ILD and recurrent pneumonia.  He subsequently developed left hemi- opacity of his lung and was hospitalized multiple times.  He was treated with prolonged and multiple courses of IV antibiotics.  He required oxygen and was subsequently weaned off.  He is now a resident of [place] [facility] rehab for the last 6 weeks.  Bronchoscopy on 6/7 with BAL was negative for AFB, fungal and bacterial cultures. Swallowing evaluation 07/2021 showed mild aspiration risk and mild dysphagia  Was last hospitalization for 6/20 to 6/27 for recurrent pneumonia with a component of acute diastolic heart failure.  He was diuresed, treated with cefepime and steroids and discharged on 3 L of oxygen. He developed shortness of breath, right-sided chest pain and hypoxia for the last 4 days prior to this admission.  He was started on ceftriaxone and doxycycline.  He was also on Lasix and prednisone .  Chest x-ray 7/13 showed right more than left infiltrate. He was seen by our nurse practitioner on 7/14 for an office visit.  Chest x-ray 7/14 showed asymmetric airspace disease in the right mid and lower lung  He developed subjective fevers and cough and sought ED attention on 7/17 Labs showed leukocytosis 20 K increased from 12, creatinine 1.5, BNP 299, lactate was 2.9 and 2.5. Chest x-ray showed worsening infiltrates on the right in a patchy pattern consistent with bronchopneumonia.  He was started on cefepime and vancomycin and PCCM consulted.  PCCM following peripherally. Called again in the early a.m. of 7/22 after patient became acutely hypoxemic.  Patient awoke and panic with  increased oxygen requirement from 4 L to 15 L, nonrebreather placed.  Transitioned to BiPAP. ABG 7.41/47/137/30.4  Pertinent  Medical History  Atrial fibrillation status post Watchman device CAD status post stent, on Plavix Sick sinus syndrome status post pacemaker RLS, essential tremor HFpEF Chronic respiratory failure with hypoxia CKD stage III  Significant Hospital Events: Including procedures, antibiotic start and stop dates in addition to other pertinent events   CT chest without con 06/2021 patchy groundglass nodules in right upper lobe CTA chest 07/2021 mild peripheral interstitial opacities in lower lung?  ILD, patchy groundglass opacities in bilateral upper lobes. CT chest without con 10/2021 left lung opacities have improved, worsening patchy groundglass opacity throughout the right lung, trace effusions , distal esophageal wall thickening with moderate hiatal hernia. Swallow evaluation/MBS 07/2021 mild aspiration risk, no restriction 7/19 Modified barium swallow. Normal swallowing function. Barium was retained in esophagus and did not pas GE junction. Cefepime>Unasyn 7/22 PCCM called for hypoxia, patient started on BiPAP    Significant studies: 7/17>> CXR: Worsening patchy right-sided opacities 7/17>> CT chest: Worsening patchy opacities in the right lung-overall improvement in left lung opacities. 7/20>> barium esophagogram: Marked narrowed appearance of distal esophagus   Significant microbiology data: 6/07>> BAL culture: Negative  6/07>> BAL AFB smear: Negative 6/07>> BAL AFB culture: Pending 6/07>> BAL fungal culture: Negative 6/07>> BAL cytology: No malignant cells identified 7/17>> COVID PCR: Negative 7/17>> blood culture: Negative   Procedures: 6/07>> bronchoscopy   Interim History / Subjective:  BG 71, D10 at 40 cc/hr started. Intubated and sedated. Plan for EGD this am.   Objective:  Afebrile, MAP>65  Blood pressure 92/65, pulse 92, temperature 98 F (36.7  C), temperature source Oral, resp. rate (!) 26, height _0  (1.854 m), weight 105.6 kg, SpO2 95 %.    Vent Mode: PRVC FiO2 (%):  [50 %-60 %] 50 % Set Rate:  [26 bmp] 26 bmp Vt Set:  [480 mL] 480 mL PEEP:  [5 cmH20] 5 cmH20 Plateau Pressure:  [21 cmH20-34 cmH20] 34 cmH20   Intake/Output Summary (Last 24 hours) at 10/30/2021 0724 Last data filed at 11/04/2021 0600 Gross per 24 hour  Intake 1511.43 ml  Output 1700 ml  Net -188.57 ml    Filed Weights   11/20/21 0350 11/22/21 0500 11/22/2021 0500  Weight: 107 kg 105 kg 105.6 kg   Intake: 1.63 L Urine: 1.7 L Output: 1.7 L Net: -70.8 cc  Examination:  General: elderly male, intubated and sedated.  HEENT: NCAT, intubated and sedated Neuro: sedated, no response to voice, or sternal rub CV: tachycardic, warm extremities PULM: rhonchi present diffusely improved from yesterday GI: Diminished bowel sounds, soft abdomen Extremities: no asymmetry  Skin: no lesions on skin  Labs: Labs show WBC at 7.6 k, Hgb at 8.8, MCV 100.0. Plt 84 from 106. BMP shows Na 146, K at 3.9, Cr 1.13, and Ca at 8.4. Mag at 2.0. CBG less than 100 Chest x-ray showed extensive interstitial and alveolar densities seen in both lungs suggestive of pulmonary edema vs multifocal PNA. Pleural effusion bilaterally R>L.    Resolved Hospital Problem list    Assessment & Plan:  Acute on chronic respiratory failure with hypoxia Recurrent pneumonia.   On Unasyn and Fluconazole. CBC yesterday showed stable leukocytosis at 11.4 k improved to 8.2k this am. Anemia stable around 9. Concern present for ARDS. Patient responded to lasix with increased UOP. GI planning EGD with dilatation today.  -Continue Unasyn for PNA (Day 7/7), will give more lasix 40 mg BID today.  -Continue vent support with volume and pressure protection given suspicion of ARDS.  -Place cortrak and start tube feeds today. Stop D10 fluids. -CTM  Anemia  Hgb at 9.0. Has multiple illness recently which are  playing a role. MCV 99. Given gradual decline, could have iron deficiency playing a role with concomitant SA63 or folic acid deficiency. MMA pending -Monitor hgb, and transfuse as needed   Chronic HFpEF History of SVT History of PAF status post watchman's device (03/22/2019) History of CAD status post multiple PCI's History of ASD (status post closure) Moderate AS Chronic. On metoprolol 37.5 mg p.o. twice daily. Not on AC due to Watchman device placement, on plavix and aspirin. All meds held due to pending palcement of enteral access.  -Start 12.5 mg BID of metoprolol. Uptitrate as tolerated. -Goal K above 4, goal MG above 2  History of vascular dementia History of CVA -Restart Namenda and Seroquel.  -On aspirin, statin, continue both once able. Plan for today after cortrak.   GERD -Change PPI to oral  Constipation Last bowel movement 07/22. No feeding recently so BM unlikely.  -Continue bowel regimen and escalate as needed if no BM after tube feeds.   Oral thrush -Continue IV Diflucan Day 4/6  Best Practice (right click and "Reselect all SmartList Selections" daily)  Diet/type: tubefeeds DVT prophylaxis: LMWH GI prophylaxis: PPI Lines: N/A Continuous: 40 cc/hr D10, 100 mcg Fentanyl, 15 mcg Propofol Foley:  N/A Code Status:  full code Last date of multidisciplinary goals of care discussion [11/04/2021]  Idamae Schuller, MD [facility]ie Rung. [facility]  Internal Medicine Residency, PGY-2  Please see [email or web address] for pager details  If no response, please call [phone] After hours, please call Elink at [phone]

## 2021-11-23 NOTE — Progress Notes (Signed)
Brief Nutrition Note  Pt underwent EGD for esophageal dilation this AM and subsequently had cortrak tube placed. Noted to be in the pyloric region on imaging. Spoke with wife, no questions about nutrition plan. Will enter recommendations and monitor for refeeding in the setting of pt's malnutrition and poor PO.   Recommend the following TF regimen: Osmolite 1.5 @ 65m/h (15649md) Start at 2575m and advance by 47m76mh to goal as pt is a high refeeding risk Prosource TF TID (40kcal and 11g of protein per packet) This regimen will provide 2460 kcal, 131g of protein, and 1189mL41mfree water.  RacheRanell Patrick LDN Clinical Dietitian RD pager # available in [facility] hours/weekend pager # available in [facility]

## 2021-11-23 NOTE — TOC Progression Note (Addendum)
Transition of Care [facility]) - Progression Note    Patient Details  Name: Mark Thomas MRN: [id number] Date of Birth: [birth year]  Transition of Care [facility]) CM/SW [place], RN Phone [phone]  11/14/2021, 3:55 PM  Clinical Narrative:     Transition of Care [facility]) Screening Note   Patient Details  Name: Mark Thomas Date of Birth: 11-17-1950   Transition of Care [facility]) CM/SW Contact:    Angelita Ingles, RN Phone Number: 11/20/2021, 3:55 PM    Transition of Care Department [facility]) has reviewed patient and no TOC needs have been identified at this time. TOC acknowledges that patient comes from [place] [facility] and will require assist with disposition planning. We will continue to monitor patient advancement through interdisciplinary progression rounds.      Expected Discharge Plan: [place] Barriers to Discharge: Continued Medical Work up  Expected Discharge Plan and Services Expected Discharge Plan: [place]   Discharge Planning Services: CM Consult Post Acute Care Choice: [place] arrangements for the past 2 months: [place]: PT, OT           Social Determinants of Health (SDOH) Interventions    Readmission Risk Interventions    10/12/2021    3:48 PM  Readmission Risk Prevention Plan  Transportation Screening Complete  Medication Review (RN Care Manager) Complete  PCP or Specialist appointment within 3-5 days of discharge Complete  HRI or Home Care Consult Complete  SW Recovery Care/Counseling Consult Complete  Palliative Care Screening Not Applicable  Skilled Nursing Facility Complete

## 2021-11-23 NOTE — Op Note (Addendum)
[facility] Patient Name: Mark Thomas Procedure Date : 11/11/2021 MRN: [id number] Attending MD: Jackquline Denmark , MD Date of Birth: 01/10/1951 CSN: [id number] Age: 71 Admit Type: Inpatient Procedure:                Upper GI endoscopy Indications:              Dysphagia with recurrent pneumonias Providers:                Jackquline Denmark, MD, Dulcy Fanny, Frazier Richards,                            Technician Referring MD:              Medicines:                bedside EGD. Pt was already intubated Complications:            No immediate complications. Estimated Blood Loss:     Estimated blood loss: none. Procedure:                Pre-Anesthesia Assessment:                           - Prior to the procedure, a History and Physical                            was performed, and patient medications and                            allergies were reviewed. The patient's tolerance of                            previous anesthesia was also reviewed. The risks                            and benefits of the procedure and the sedation                            options and risks were discussed with the patient.                            All questions were answered, and informed consent                            was obtained. Prior Anticoagulants: The patient has                            taken no previous anticoagulant or antiplatelet                            agents. ASA Grade Assessment: III - A patient with                            severe systemic disease. After reviewing the risks  and benefits, the patient was deemed in                            satisfactory condition to undergo the procedure.                           After obtaining informed consent, the endoscope was                            passed under direct vision. Throughout the                            procedure, the patient's blood pressure, pulse, and                             oxygen saturations were monitored continuously. The                            [id number]-H190 ([id number]) Olympus endoscope was introduced                            through the mouth, and advanced to the second part                            of duodenum. The upper GI endoscopy was                            accomplished without difficulty. The patient                            tolerated the procedure well. Scope In: Scope Out: Findings:      The examined esophagus was mildly tortuous. Biopsies were obtained from       the proximal and distal esophagus with cold forceps for histology of       suspected eosinophilic esophagitis.      One benign-appearing, intrinsic mild stenosis was found at [place]       40 cm from the incisors. This stenosis measured 1.0 cm (inner diameter).       The stenosis was traversed. The scope was passed with gentle pressure       and a feeling of "catch" on passage of scope. A TTS dilator was passed       through the scope. Dilation with a 12-13.5-15 mm balloon dilator was       performed to 15 mm.      Diffuse mild inflammation characterized by erythema was found in the       gastric antrum. Biopsies were taken with a cold forceps for histology. I       did not appreciate hiatal hernia. Some retained thick mucus in fundus       which could not be fully suctioned.      The examined duodenum was normal. Impression:               - Benign-appearing esophageal stenosis. Dilated.                           -  Mild pressbyesophagus                           - Gastritis. Biopsied.                           - Normal examined duodenum. Recommendation:           - Can insert NG/cortrack tube for feeding                           - Continue present medications.                           - Await pathology results.                           - If still with problems, would procced with                            esophageal manometry as outpatient to r/o motility                             disorder/achalasia                           - The findings and recommendations were discussed                            with the patient's family.                           - FU with me as outpt. I have given patient's wife                            all her contact numbers.                           - Will sign off for now.                           - D/W Dr Lake Bells. Procedure Code(s):        --- Professional ---                           [phone], Esophagogastroduodenoscopy, flexible,                            transoral; with transendoscopic balloon dilation of                            esophagus (less than 30 mm diameter)                           43239, 59, Esophagogastroduodenoscopy, flexible,                            transoral; with biopsy, single or multiple  Diagnosis Code(s):        --- Professional ---                           Q39.9, Congenital malformation of esophagus,                            unspecified                           K22.2, Esophageal obstruction                           K29.70, Gastritis, unspecified, without bleeding                           R13.10, Dysphagia, unspecified CPT copyright 2019 American Medical Association. All rights reserved. The codes documented in this report are preliminary and upon coder review may  be revised to meet current compliance requirements. Jackquline Denmark, MD 11/22/2021 10:00:57 AM This report has been signed electronically. Number of Addenda: 0

## 2021-11-23 NOTE — Procedures (Signed)
Cortrak  Tube Type:  Cortrak - 43 inches Tube Location:  Right nare Initial Placement:  Stomach Secured by: Bridle Technique Used to Measure Tube Placement:  Marking at nare/corner of mouth Cortrak Secured At:  70 cm   Cortrak Tube Team Note:  Consult received to place a Cortrak feeding tube.   X-ray is required, abdominal x-ray has been ordered by the Cortrak team. Please confirm tube placement before using the Cortrak tube.   If the tube becomes dislodged please keep the tube and contact the Cortrak team at [email or web address] ([email or web address]) for replacement.  If after hours and replacement cannot be delayed, place a NG tube and confirm placement with an abdominal x-ray.    Koleen Distance MS, RD, LDN Please refer to [facility] for RD and/or RD on-call/weekend/after hours pager

## 2021-11-23 NOTE — Progress Notes (Signed)
OT Cancellation Note and Discharge  Patient Details Name: Mark Thomas MRN: [id number] DOB: 1951/03/03   Cancelled Treatment:    Reason Eval/Treat Not Completed: Medical issues which prohibited therapy. Pt continues on full vent support and sedation. Chat text with Dr. Lake Bells and we will sign off for now and await new order.   Mark Thomas, OTR/L Acute Rehab Services [facility] [phone] Office [phone]    Mark Thomas 11/24/2021, 11:04 AM

## 2021-11-23 NOTE — Progress Notes (Signed)
Attending:    Subjective: Diuresed  with lasix yesterday but I/O essentially   Objective: Vitals:   11/12/2021 0915 11/13/2021 0920 11/19/2021 0925 11/20/2021 0930  BP: 109/80 1_0  Pulse: 94 78 82 97  Resp: (!) 26 (!) 26 (!) 26 (!) 22  Temp:      TempSrc:      SpO2: 100% 100% 100% 100%  Weight:      Height:       Vent Mode: PRVC FiO2 (%):  [40 %-60 %] 40 % Set Rate:  [26 bmp] 26 bmp Vt Set:  [480 mL] 480 mL PEEP:  [5 cmH20] 5 cmH20 Plateau Pressure:  [21 cmH20-34 cmH20] 34 cmH20  Intake/Output Summary (Last 24 hours) at 11/14/2021 1013 Last data filed at 11/22/2021 0933 Gross per 24 hour  Intake 1442.43 ml  Output 1700 ml  Net -257.57 ml    General:  In bed on vent HENT: NCAT ETT in place PULM:  vent supported breathing CV: warm, well perfused GI: ND/NT,  soft, nontender MSK: normal bulk and tone Neuro: sedated on vent    CBC    Component Value Date/Time   WBC 7.6 11/28/2021 0052   RBC 2.82 (L) 11/05/2021 0052   HGB 8.8 (L) 11/25/2021 0052   HCT 28.2 (L) 11/10/2021 0052   PLT 84 (L) 11/21/2021 0052   MCV 100.0 10/30/2021 0052   MCH 31.2 11/22/2021 0052   MCHC 31.2 11/04/2021 0052   RDW 16.9 (H) 11/06/2021 0052   LYMPHSABS 1.3 11/17/2021 0052   MONOABS 0.5 11/01/2021 0052   EOSABS 0.2 11/26/2021 0052   BASOSABS 0.0 11/08/2021 0052    BMET    Component Value Date/Time   NA 146 (H) 10/30/2021 0052   NA 137 03/29/2021 1158   K 3.9 11/29/2021 0052   CL 108 11/22/2021 0052   CO2 28 11/25/2021 0052   GLUCOSE 94 11/26/2021 0052   BUN 21 10/31/2021 0052   BUN 23 03/29/2021 1158   CREATININE 1.13 11/26/2021 0052   CREATININE 0.99 09/28/2021 0000   CALCIUM 8.4 (L) 11/21/2021 0052   GFRNONAA >60 11/18/2021 0052    CXR images personally reviewed with persistent infiltrates/air space disease R>L ett in place  Impression/Plan: Aspiratoin pneumonia causing ARDS/acute respiratory failure with hypoxemia> continue unasyn fo 7 day course; diurese  today with lasix x2 doses today; continue full mechanical ventilator support Anemia, chronic without bleeding, normocytic, suspect anemia of chronic disease> check Fe studies and reticulocyte count Atrial fibrillation> change to metoprolol per tube, give 12.5 and titrate up Aspiration/esophageal dysmotility and GE junction narrowing> s/p balloon today per GI, place cor trak today and start tube feeding CAD, s/p PCI> restart plavix today Gastritis> PPI via tube  My cc time 31 minutes  Roselie Awkward, MD [place] [facility] Pager: [phone] Cell: [phone] After 7pm: [phone]

## 2021-11-23 NOTE — Progress Notes (Signed)
PT Cancellation Note  Patient Details Name: Mark Thomas MRN: [id number] DOB: 07/28/1950   Cancelled Treatment:    Reason Eval/Treat Not Completed: Medical issues which prohibited therapy  Pt continues on full vent support and sedation. Chat text with Dr. Lake Bells and we will sign off for now and await new order.  11/06/2021  Ginger Carne., PT Acute Rehabilitation Services [phone]  (pager) [phone]  (office)   Tessie Fass Keiva Dina 11/22/2021, 11:20 AM

## 2021-11-24 ENCOUNTER — Inpatient Hospital Stay (HOSPITAL_COMMUNITY): Payer: Medicare Other

## 2021-11-24 ENCOUNTER — Encounter (HOSPITAL_COMMUNITY): Payer: Self-pay | Admitting: Gastroenterology

## 2021-11-24 DIAGNOSIS — M7989 Other specified soft tissue disorders: Secondary | ICD-10-CM

## 2021-11-24 DIAGNOSIS — J189 Pneumonia, unspecified organism: Secondary | ICD-10-CM | POA: Diagnosis not present

## 2021-11-24 LAB — GLUCOSE, CAPILLARY
Glucose-Capillary: 159 mg/dL — ABNORMAL HIGH (ref 70–99)
Glucose-Capillary: 172 mg/dL — ABNORMAL HIGH (ref 70–99)
Glucose-Capillary: 159 mg/dL — ABNORMAL HIGH (ref 70–99)
Glucose-Capillary: 178 mg/dL — ABNORMAL HIGH (ref 70–99)
Glucose-Capillary: 196 mg/dL — ABNORMAL HIGH (ref 70–99)
Glucose-Capillary: 167 mg/dL — ABNORMAL HIGH (ref 70–99)
Glucose-Capillary: 128 mg/dL — ABNORMAL HIGH (ref 70–99)

## 2021-11-24 LAB — CBC
HCT: 28.9 % — ABNORMAL LOW (ref 39.0–52.0)
Hemoglobin: 9 g/dL — ABNORMAL LOW (ref 13.0–17.0)
MCH: 31.4 pg (ref 26.0–34.0)
MCHC: 31.1 g/dL (ref 30.0–36.0)
MCV: 100.7 fL — ABNORMAL HIGH (ref 80.0–100.0)
Platelets: 79 10*3/uL — ABNORMAL LOW (ref 150–400)
RBC: 2.87 MIL/uL — ABNORMAL LOW (ref 4.22–5.81)
RDW: 16.6 % — ABNORMAL HIGH (ref 11.5–15.5)
WBC: 7.6 10*3/uL (ref 4.0–10.5)
nRBC: 0.7 % — ABNORMAL HIGH (ref 0.0–0.2)

## 2021-11-24 LAB — MAGNESIUM
Magnesium: 2.1 mg/dL (ref 1.7–2.4)
Magnesium: 2 mg/dL (ref 1.7–2.4)

## 2021-11-24 LAB — BASIC METABOLIC PANEL
Anion gap: 8 (ref 5–15)
BUN: 28 mg/dL — ABNORMAL HIGH (ref 8–23)
CO2: 33 mmol/L — ABNORMAL HIGH (ref 22–32)
Calcium: 8.3 mg/dL — ABNORMAL LOW (ref 8.9–10.3)
Chloride: 106 mmol/L (ref 98–111)
Creatinine, Ser: 1.29 mg/dL — ABNORMAL HIGH (ref 0.61–1.24)
GFR, Estimated: >60 mL/min (ref >60)
Glucose, Bld: 182 mg/dL — ABNORMAL HIGH (ref 70–99)
Potassium: 3.4 mmol/L — ABNORMAL LOW (ref 3.5–5.1)
Sodium: 147 mmol/L — ABNORMAL HIGH (ref 135–145)

## 2021-11-24 LAB — HEMOGLOBIN A1C
Hgb A1c MFr Bld: 5.7 % — ABNORMAL HIGH (ref 4.8–5.6)
Mean Plasma Glucose: 116.89 mg/dL

## 2021-11-24 LAB — PHOSPHORUS
Phosphorus: 4.2 mg/dL (ref 2.5–4.6)
Phosphorus: 3.2 mg/dL (ref 2.5–4.6)

## 2021-11-24 LAB — HEPARIN LEVEL (UNFRACTIONATED): Heparin Unfractionated: 0.51 IU/mL (ref 0.30–0.70)

## 2021-11-24 MED ORDER — POTASSIUM CHLORIDE 20 MEQ PO PACK
40.0000 meq | PACK | Freq: Two times a day (BID) | ORAL | Status: AC
  Administered 2021-11-24 (×2): 40 meq
  Filled 2021-11-24 (×2): qty 2

## 2021-11-24 MED ORDER — BISACODYL 5 MG PO TBEC: 10.0000 mg | DELAYED_RELEASE_TABLET | Freq: Every day | ORAL | Status: DC

## 2021-11-24 MED ORDER — SENNOSIDES-DOCUSATE SODIUM 8.6-50 MG PO TABS
2.0000 | ORAL_TABLET | Freq: Two times a day (BID) | ORAL | Status: DC
  Administered 2021-11-24 (×2): 2 via ORAL
  Filled 2021-11-24 (×3): qty 2

## 2021-11-24 MED ORDER — HEPARIN (PORCINE) 25000 UT/250ML-% IV SOLN
1600.0000 [IU]/h | INTRAVENOUS | Status: AC
Start: 2021-11-24 — End: 2021-11-25
  Administered 2021-11-24 – 2021-11-25 (×2): 1650 [IU]/h via INTRAVENOUS
  Filled 2021-11-24 (×2): qty 250

## 2021-11-24 MED ORDER — BISACODYL 10 MG RE SUPP: 10.0000 mg | Freq: Every day | RECTAL | Status: DC

## 2021-11-24 MED ORDER — SODIUM CHLORIDE 0.9% FLUSH: 10.0000 mL | INTRAVENOUS | Status: DC | PRN

## 2021-11-24 MED ORDER — METOPROLOL TARTRATE 25 MG/10 ML ORAL SUSPENSION
25.0000 mg | Freq: Two times a day (BID) | ORAL | Status: DC
  Administered 2021-11-24 – 2021-11-26 (×5): 25 mg
  Filled 2021-11-24 (×7): qty 10

## 2021-11-24 MED ORDER — HEPARIN BOLUS VIA INFUSION
3000.0000 [IU] | Freq: Once | INTRAVENOUS | Status: AC
  Administered 2021-11-24: 3000 [IU] via INTRAVENOUS
  Filled 2021-11-24: qty 3000

## 2021-11-24 MED ORDER — METOPROLOL TARTRATE 25 MG/10 ML ORAL SUSPENSION
25.0000 mg | Freq: Two times a day (BID) | ORAL | Status: DC
Start: 2021-11-24 — End: 2021-11-24
  Filled 2021-11-24: qty 10

## 2021-11-24 MED ORDER — INSULIN ASPART 100 UNIT/ML IJ SOLN
0.0000 [IU] | INTRAMUSCULAR | Status: DC
  Administered 2021-11-24 – 2021-11-25 (×7): 4 [IU] via SUBCUTANEOUS
  Administered 2021-11-25: 3 [IU] via SUBCUTANEOUS
  Administered 2021-11-26: 4 [IU] via SUBCUTANEOUS

## 2021-11-24 MED ORDER — POTASSIUM CHLORIDE CRYS ER 20 MEQ PO TBCR
40.0000 meq | EXTENDED_RELEASE_TABLET | Freq: Two times a day (BID) | ORAL | Status: DC
Start: 2021-11-24 — End: 2021-11-24

## 2021-11-24 MED ORDER — SODIUM CHLORIDE 0.9% FLUSH
10.0000 mL | Freq: Two times a day (BID) | INTRAVENOUS | Status: DC
  Administered 2021-11-24 – 2021-11-26 (×4): 10 mL

## 2021-11-24 NOTE — Progress Notes (Signed)
[place] for heparin dosing  Indication: DVT  Allergies  Allergen Reactions   Dilaudid [Hydromorphone] Other (See Comments)    Respiratory Arrest!!!!   Bactrim [Sulfamethoxazole-Trimethoprim] Rash   Benadryl [Diphenhydramine] Other (See Comments)    Muscle spasms   Phenobarbital Other (See Comments)    From childhood- reaction not recalled at this time   Penicillins Other (See Comments)    From childhood- reaction not recalled at this time    Patient Measurements: Height: _0  (185.4 cm) Weight: 107.7 kg (237 lb 7 oz) IBW/kg (Calculated) : 79.9 Heparin Dosing Weight: 102kg   Vital Signs: Temp: 97.7 F (36.5 C) (07/26 1919) Temp Source: Axillary (07/26 1919) BP: 168/86 (07/26 2130) Pulse Rate: 96 (07/26 2130)  Labs: Recent Labs    11/22/21 0601 11/22/2021 0052 11/24/21 0629 11/24/21 2132  HGB 9.0* 8.8* 9.0*  --   HCT 28.5* 28.2* 28.9*  --   PLT 106* 84* 79*  --   HEPARINUNFRC  --   --   --  0.51  CREATININE 0.97  0.91 1.13 1.29*  --      Estimated Creatinine Clearance: 70.5 mL/min (A) (by C-G formula based on SCr of 1.29 mg/dL (H)).   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 Platelets trending down but no bleeding. Hemoglobin has been stable at approximately 8-9.   Heparin level therapeutic (0.51) on infusion at 1650  units/hr. No bleeding noted.  Goal of Therapy:  Heparin level 0.3-0.7 units/ml Monitor platelets by anticoagulation protocol: Yes   Plan:  Continue heparin at 1650 units/hr Will f/u a.m. level to confirm therapeutic  Sherlon Handing, PharmD, BCPS Please see amion for complete clinical pharmacist phone list 11/24/2021,11:01 PM

## 2021-11-24 NOTE — Progress Notes (Signed)
[facility] PCCM  Son, mother updated in conference room in ICU at length. Explained that he will have a protracted recovery, discussed what that would look like. Currently still planning on full medical support  Roselie Awkward, MD [place] P[facility]M Pager: [phone] Cell: [phone] After 7:00 pm call [place]  [phone]

## 2021-11-24 NOTE — Progress Notes (Signed)
Attending:    Subjective: Cor trak placed after EGD yesterday where his GE junction was ballooned open 50% FiO2 Received lasix x2, not all urine was collected because of incontinence    Objective: Vitals:   11/24/21 0725 11/24/21 0730 11/24/21 0800 11/24/21 0831  BP:  109/65 101/66 (!) 155/91  Pulse: (!) 106 92 81 (!) 104  Resp: _0 Temp: 98.8 F (37.1 C)     TempSrc: Oral     SpO2: 96% 95% 95% 93%  Weight:      Height:       Vent Mode: PRVC FiO2 (%):  [40 %-60 %] 50 % Set Rate:  [26 bmp] 26 bmp Vt Set:  [480 mL] 480 mL PEEP:  [5 cmH20] 5 cmH20 Plateau Pressure:  [22 cmH20-31 cmH20] 24 cmH20  Intake/Output Summary (Last 24 hours) at 11/24/2021 0933 Last data filed at 11/24/2021 4193 Gross per 24 hour  Intake 1906.22 ml  Output 1300 ml  Net 606.22 ml    General:  In bed on vent HENT: NCAT ETT in place PULM: Rhonchi B, vent supported breathing CV: RRR, no mgr GI: BS+, soft, nontender MSK: normal bulk and tone Neuro: sedated on vent Derm: left arm swelling     CBC    Component Value Date/Time   WBC 7.6 11/24/2021 0629   RBC 2.87 (L) 11/24/2021 0629   HGB 9.0 (L) 11/24/2021 0629   HCT 28.9 (L) 11/24/2021 0629   PLT 79 (L) 11/24/2021 0629   MCV 100.7 (H) 11/24/2021 0629   MCH 31.4 11/24/2021 0629   MCHC 31.1 11/24/2021 0629   RDW 16.6 (H) 11/24/2021 0629   LYMPHSABS 1.3 11/26/2021 0052   MONOABS 0.5 11/04/2021 0052   EOSABS 0.2 11/02/2021 0052   BASOSABS 0.0 11/29/2021 0052    BMET    Component Value Date/Time   NA 147 (H) 11/24/2021 0629   NA 137 03/29/2021 1158   K 3.4 (L) 11/24/2021 0629   CL 106 11/24/2021 0629   CO2 33 (H) 11/24/2021 0629   GLUCOSE 182 (H) 11/24/2021 0629   BUN 28 (H) 11/24/2021 0629   BUN 23 03/29/2021 1158   CREATININE 1.29 (H) 11/24/2021 0629   CREATININE 0.99 09/28/2021 0000   CALCIUM 8.3 (L) 11/24/2021 0629   GFRNONAA >60 11/24/2021 0629    CXR images personally reviewed R>L air space disease improved  compared to prior  Impression/Plan: Aspiratoin pneumonia causing ARDS/acute respiratory failure with hypoxemia> completed unasyn, hold lasix given rising creatinine; continue full vent support, attempt pressure support today Anemia, chronic without bleeding, normocytic, suspect anemia of chronic disease> follow up methylmalonic acid, continue nutritional support Atrial fibrillation> metoprolol per tube bid, increase today Aspiration/esophageal dysmotility and GE junction narrowing, s/p balloon dilation on 7/26 >  f/u with GI, continue tube feeding  CAD, s/p PCI> continue plavix Gastritis> ppi Need for sedation for mechanical ventilation, narcotic dependence at baseline> stop percocet, continue PAD protocol  Rising Creatinine > hold lasix Left arm swelling> ultrasound today to assess for DVT  My cc time 32 minutes  Roselie Awkward, MD [place] P[facility]M Pager: [phone] Cell: [phone] After 7pm: [phone]

## 2021-11-24 NOTE — Progress Notes (Signed)
eLink Physician-Brief Progress Note Patient Name: Mark Thomas DOB: [birth year] MRN: [id number]   Date of Service  11/24/2021  HPI/Events of Note  Hyperglycemia - Started on tube feeds. Blood glucose = 196. BMI = 31.33.  eICU Interventions  Plan: Q 4 hour resistant Novolog SSI.     Intervention Category Major Interventions: Hyperglycemia - active titration of insulin therapy  [name] 11/24/2021, 8:51 PM

## 2021-11-24 NOTE — Plan of Care (Signed)
Pt remains ventilated and sedated with fentanyl. Tolerated PS 50% 10/5 this afternoon. Transitioned back to rate per RT. BP fluctuates. Pt has significant edema to BUE. LUE > RUE with confirmed DVT pt started on Heparin gtt managed by pharmacy. Metoprolol dose increased for HR control. Tube feed at goal. Pt has not had a BM and is currently on bowel regimen (sennakot and miralax). UO for shift 300 cc dark concentrated urine. Midline placed by IV team RUA. Existing 20G LUE. Skin tear to right hand noted. Repositioned 2 q hrs. External male catheter changed. Wife updated at bedside.   Problem: Activity: Goal: Ability to tolerate increased activity will improve Outcome: Not Progressing   Problem: Clinical Measurements: Goal: Ability to maintain a body temperature in the normal range will improve Outcome: Progressing   Problem: Respiratory: Goal: Ability to maintain adequate ventilation will improve Outcome: Progressing Goal: Ability to maintain a clear airway will improve Outcome: Progressing

## 2021-11-24 NOTE — Progress Notes (Signed)
Left upper extremity venous duplex has been completed. Preliminary results can be found in CV Proc through chart review.  Results were given to Dr. Lake Bells.  11/24/21 12:14 PM Mark Thomas RVT

## 2021-11-24 NOTE — Progress Notes (Signed)
A midline has been ordered for this patient. The [facility]AST RN has reviewed the patient's medical record including any arm restrictions, current creatinine clearance, length IV therapy is needed, and infusions needed/ordered to determine if a midline is the appropriate line for this individual patient. If there are contraindications, the physician and primary RN has been contacted by [facility] RN for further discussion. Midline Education: a midline is a long peripheral IV placed in the upper arm with the tip located at or near the axilla and distal to the shoulder should not be used as a CLABSI preventative measure   it has one lumen only it can remain in place for up to 29 days  Safe for Vancomycin infusion LESS THAN 6 days Is safe for power injection if good blood return can be obtained and line flushes easily it CANNOT be used for continuous infusion of vesicants. Including TPN and chemotherapy Should NOT be placed for sole intent of obtaining labs as there is no guarantee blood can be successfully drawn from line  contraindicated in patients with thrombosis, hypercoagulability, decreased venous flow to the extremities, ESRD (without a nephrologist's approval), small vessels, allergy to polyurethane, or known/suspected presence of a device-related infection, bacteremia, or septicemia  Placed for heparin drip.

## 2021-11-24 NOTE — Progress Notes (Addendum)
ANTICOAGULATION CONSULT NOTE - Initial Consult  Pharmacy Consult for heparin dosing  Indication: DVT  Allergies  Allergen Reactions   Dilaudid [Hydromorphone] Other (See Comments)    Respiratory Arrest!!!!   Bactrim [Sulfamethoxazole-Trimethoprim] Rash   Benadryl [Diphenhydramine] Other (See Comments)    Muscle spasms   Phenobarbital Other (See Comments)    From childhood- reaction not recalled at this time   Penicillins Other (See Comments)    From childhood- reaction not recalled at this time    Patient Measurements: Height: _0  (185.4 cm) Weight: 107.7 kg (237 lb 7 oz) IBW/kg (Calculated) : 79.9 Heparin Dosing Weight: 102kg   Vital Signs: Temp: 98.2 F (36.8 C) (07/26 1124) Temp Source: Oral (07/26 1124) BP: 130/69 (07/26 1230) Pulse Rate: 92 (07/26 1300)  Labs: Recent Labs    11/22/21 0601 11/18/2021 0052 11/24/21 0629  HGB 9.0* 8.8* 9.0*  HCT 28.5* 28.2* 28.9*  PLT 106* 84* 79*  CREATININE 0.97  0.91 1.13 1.29*    Estimated Creatinine Clearance: 70.5 mL/min (A) (by C-G formula based on SCr of 1.29 mg/dL (H)).   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 Platelets trending down but no bleeding. Hemoglobin has been stable at approximately 8-9. Renal function stable with current creatinine clearance of 70.68m/min.   Goal of Therapy:  Heparin level 0.3-0.7  units/ml Monitor platelets by anticoagulation protocol: Yes   Plan:  Half dose bolus of heparin since patient has been on Lovenox for DVT prophylaxis Give 3000 units bolus x 1 Start heparin infusion at 1650 units/hr Check anti-Xa level in 6 hours and daily while on heparin Continue to monitor H&H and platelets  HVentura Sellers7/26/2023,1:31 PM

## 2021-11-24 NOTE — Progress Notes (Addendum)
NAME:  Mark Thomas, MRN:  [id number], DOB:  12-02-1950, LOS: 9 ADMISSION DATE:  11/14/2021, CONSULTATION DATE:  11/20/21  REFERRING MD:  Waldron Labs, TRH, CHIEF COMPLAINT: Recurrent pneumonia  History of Present Illness:  71 year old never smoker that we are asked to evaluate for recurrent pneumonia. He was initially seen as a pulmonary consult 09/15/2018 for ILD and recurrent pneumonia.  He subsequently developed left hemi- opacity of his lung and was hospitalized multiple times.  He was treated with prolonged and multiple courses of IV antibiotics.  He required oxygen and was subsequently weaned off.  He is now a resident of [facility] for the last 6 weeks.  Bronchoscopy on 6/7 with BAL was negative for AFB, fungal and bacterial cultures. Swallowing evaluation 07/2021 showed mild aspiration risk and mild dysphagia  Was last hospitalization for 6/20 to 6/27 for recurrent pneumonia with a component of acute diastolic heart failure.  He was diuresed, treated with cefepime and steroids and discharged on 3 L of oxygen. He developed shortness of breath, right-sided chest pain and hypoxia for the last 4 days prior to this admission.  He was started on ceftriaxone and doxycycline.  He was also on Lasix and prednisone .  Chest x-ray 7/13 showed right more than left infiltrate. He was seen by our nurse practitioner on 7/14 for an office visit.  Chest x-ray 7/14 showed asymmetric airspace disease in the right mid and lower lung  He developed subjective fevers and cough and sought ED attention on 7/17 Labs showed leukocytosis 20 K increased from 12, creatinine 1.5, BNP 299, lactate was 2.9 and 2.5. Chest x-ray showed worsening infiltrates on the right in a patchy pattern consistent with bronchopneumonia.  He was started on cefepime and vancomycin and PCCM consulted.  PCCM following peripherally. Called again in the early a.m. of 7/22 after patient became acutely hypoxemic.  Patient awoke and panic with  increased oxygen requirement from 4 L to 15 L, nonrebreather placed.  Transitioned to BiPAP. ABG 7.41/47/137/30.4  Pertinent  Medical History  Atrial fibrillation status post Watchman device CAD status post stent, on Plavix Sick sinus syndrome status post pacemaker RLS, essential tremor HFpEF Chronic respiratory failure with hypoxia CKD stage III  Significant Hospital Events: Including procedures, antibiotic start and stop dates in addition to other pertinent events   CT chest without con 06/2021 patchy groundglass nodules in right upper lobe CTA chest 07/2021 mild peripheral interstitial opacities in lower lung?  ILD, patchy groundglass opacities in bilateral upper lobes. CT chest without con 10/2021 left lung opacities have improved, worsening patchy groundglass opacity throughout the right lung, trace effusions , distal esophageal wall thickening with moderate hiatal hernia. Swallow evaluation/MBS 07/2021 mild aspiration risk, no restriction 7/19 Modified barium swallow. Normal swallowing function. Barium was retained in esophagus and did not pas GE junction. Cefepime>Unasyn 7/22 PCCM called for hypoxia, patient started on BiPAP    Significant studies: 7/17>> CXR: Worsening patchy right-sided opacities 7/17>> CT chest: Worsening patchy opacities in the right lung-overall improvement in left lung opacities. 7/20>> barium esophagogram: Marked narrowed appearance of distal esophagus   Significant microbiology data: 6/07>> BAL culture: Negative  6/07>> BAL AFB smear: Negative 6/07>> BAL AFB culture: Pending 6/07>> BAL fungal culture: Negative 6/07>> BAL cytology: No malignant cells identified 7/17>> COVID PCR: Negative 7/17>> blood culture: Negative   Procedures: 6/07>> bronchoscopy   Interim History / Subjective:  Intubated and sedated  Objective: Afebrile, MAP>65 except 1 at 87 yesterday.  Blood pressure 109/65, pulse 92,  temperature 98.8 F (37.1 C), temperature source Oral,  resp. rate 13, height _0  (1.854 m), weight 107.7 kg, SpO2 95 %.    Vent Mode: PRVC FiO2 (%):  [40 %-60 %] 50 % Set Rate:  [26 bmp] 26 bmp Vt Set:  [480 mL] 480 mL PEEP:  [5 cmH20] 5 cmH20 Plateau Pressure:  [22 cmH20-31 cmH20] 24 cmH20   Intake/Output Summary (Last 24 hours) at 11/24/2021 0748 Last data filed at 11/24/2021 0700 Gross per 24 hour  Intake 2076.36 ml  Output 1300 ml  Net 776.36 ml   Filed Weights   11/22/21 0500 11/21/2021 0500 11/24/21 0500  Weight: 105 kg 105.6 kg 107.7 kg   Intake: 2.086 L Urine: 1300 cc Output: 1.3 L Net: +61.1 cc   Examination:  General: elderly male, intubated and sedated.  HEENT: NCAT, intubated and sedated Neuro: sedated, respond to voice but not following commands CV: tachycardic, irregular rhythm, warm extremities PULM: rhonchi present diffusely improved from yesterday GI: Diminished bowel sounds, soft abdomen Extremities: LUE more fuller and tighter then right extremity.  Skin: bruises and petechiae present on bilateral upper extremities.   Labs: Labs show WBC at 7.6 k, Hgb at 9.0, MCV 100.7. Plt 79 from 84. BMP shows Na 147, K at 3.4, Cr 1.29, and Ca at 8.3. Mag at 2.1. CBG less than 180. Chest x-ray showed extensive interstitial and alveolar densities seen in both lungs suggestive of pulmonary edema vs multifocal PNA which is same as yesterday with slight improvement.    Resolved Hospital Problem list    Assessment & Plan:  Acute on chronic respiratory failure with hypoxia Recurrent pneumonia.   On Unasyn and Fluconazole. CBC yesterday showed stable leukocytosis at 11.4 k improved to 8.2k this am. Anemia stable around 9. Concern present for ARDS. Pt has increased UOP with initial lasix dosing but did not respond to BID dosing. GI performed EGD with dilatation yesterday and have plans for manometry outpatient.   -Patient s/p Unasyn for 7 days -Continue vent support with volume and pressure protection given suspicion of ARDS. Plan  for pressure support and extubation if appropriate.  -Continue tube feeds via cortrak -CTM  Elevated Creatinine Hypokalemia Appears to be in setting of lasix use. K at 3.4. -Replete K with 40 mEq BID today.  -CTM renal fxn for now.   Anemia  Hgb at 9.0. Has multiple illness recently which are playing a role. MCV 100.7. Given gradual decline, could have iron deficiency playing a role with concomitant JX91 or folic acid deficiency.  -Monitor hgb, and transfuse as needed with goal of 8.  Chronic HFpEF History of SVT History of PAF status post watchman's device (03/22/2019) History of CAD status post multiple PCI's History of ASD (status post closure) Moderate AS Chronic. On metoprolol 37.5 mg p.o. twice daily. Not on AC due to Watchman device placement, on plavix and aspirin. All meds held due to pending palcement of enteral access.  -Increase metoprolol to 25 mg BID. Uptitrate as tolerated to home dose. -Goal K above 4, goal MG above 2  History of vascular dementia History of CVA -Continue Namenda and Seroquel.  -Continue aspirin, and statin  GERD -Change PPI daily  Constipation Last bowel movement 07/22. Started tube feeds yesterday.  -Continue bowel regimen with daily Miralax and senna.  Oral thrush -Continue IV Diflucan Day 5/6  Left Upper Extremity Swelling Noted on exam today. Will order LUE doppler to rule out DVT.   Best Practice (right click and "Reselect  all SmartList Selections" daily)  Diet/type: tubefeeds DVT prophylaxis: LMWH GI prophylaxis: PPI Lines: N/A Continuous:  Tube feeds Foley:  N/A Code Status:  full code Last date of multidisciplinary goals of care discussion [11/24/21]  Idamae Schuller, MD [facility]. [facility] Internal Medicine Residency, PGY-2  Please see [email or web address] for pager details  If no response, please call [phone] After hours, please call Elink at [phone]

## 2021-11-24 NOTE — Progress Notes (Signed)
.  Dear Doctor: Lake Bells, This patient has been identified as a candidate for PICC for the following reason (s): poor veins/poor circulatory system (CHF, COPD, emphysema, diabetes, steroid use, IV drug abuse, etc.) If you agree, please write an order for the indicated device. For any questions contact the Vascular Access Team at [phone] if no answer, please leave a message.  Thank you for supporting the early vascular access assessment program.

## 2021-11-25 ENCOUNTER — Inpatient Hospital Stay (HOSPITAL_COMMUNITY): Payer: Medicare Other

## 2021-11-25 ENCOUNTER — Encounter: Payer: Self-pay | Admitting: Gastroenterology

## 2021-11-25 DIAGNOSIS — J189 Pneumonia, unspecified organism: Secondary | ICD-10-CM | POA: Diagnosis not present

## 2021-11-25 LAB — BASIC METABOLIC PANEL
Anion gap: 4 — ABNORMAL LOW (ref 5–15)
BUN: 26 mg/dL — ABNORMAL HIGH (ref 8–23)
CO2: 33 mmol/L — ABNORMAL HIGH (ref 22–32)
Calcium: 8.4 mg/dL — ABNORMAL LOW (ref 8.9–10.3)
Chloride: 113 mmol/L — ABNORMAL HIGH (ref 98–111)
Creatinine, Ser: 1.03 mg/dL (ref 0.61–1.24)
GFR, Estimated: >60 mL/min (ref >60)
Glucose, Bld: 218 mg/dL — ABNORMAL HIGH (ref 70–99)
Potassium: 4.7 mmol/L (ref 3.5–5.1)
Sodium: 150 mmol/L — ABNORMAL HIGH (ref 135–145)

## 2021-11-25 LAB — CBC
HCT: 28.4 % — ABNORMAL LOW (ref 39.0–52.0)
Hemoglobin: 8.4 g/dL — ABNORMAL LOW (ref 13.0–17.0)
MCH: 30.8 pg (ref 26.0–34.0)
MCHC: 29.6 g/dL — ABNORMAL LOW (ref 30.0–36.0)
MCV: 104 fL — ABNORMAL HIGH (ref 80.0–100.0)
Platelets: 75 10*3/uL — ABNORMAL LOW (ref 150–400)
RBC: 2.73 MIL/uL — ABNORMAL LOW (ref 4.22–5.81)
RDW: 17 % — ABNORMAL HIGH (ref 11.5–15.5)
WBC: 12.8 10*3/uL — ABNORMAL HIGH (ref 4.0–10.5)
nRBC: 0.4 % — ABNORMAL HIGH (ref 0.0–0.2)

## 2021-11-25 LAB — MAGNESIUM
Magnesium: 2 mg/dL (ref 1.7–2.4)
Magnesium: 2.1 mg/dL (ref 1.7–2.4)

## 2021-11-25 LAB — GLUCOSE, CAPILLARY
Glucose-Capillary: 197 mg/dL — ABNORMAL HIGH (ref 70–99)
Glucose-Capillary: 179 mg/dL — ABNORMAL HIGH (ref 70–99)
Glucose-Capillary: 157 mg/dL — ABNORMAL HIGH (ref 70–99)
Glucose-Capillary: 155 mg/dL — ABNORMAL HIGH (ref 70–99)
Glucose-Capillary: 189 mg/dL — ABNORMAL HIGH (ref 70–99)

## 2021-11-25 LAB — PHOSPHORUS
Phosphorus: 3 mg/dL (ref 2.5–4.6)
Phosphorus: 3.4 mg/dL (ref 2.5–4.6)

## 2021-11-25 LAB — SURGICAL PATHOLOGY

## 2021-11-25 LAB — HEPARIN LEVEL (UNFRACTIONATED): Heparin Unfractionated: 0.67 IU/mL (ref 0.30–0.70)

## 2021-11-25 LAB — TRIGLYCERIDES: Triglycerides: 125 mg/dL (ref <150)

## 2021-11-25 MED ORDER — SENNOSIDES-DOCUSATE SODIUM 8.6-50 MG PO TABS
2.0000 | ORAL_TABLET | Freq: Two times a day (BID) | ORAL | Status: DC
  Administered 2021-11-25 – 2021-11-26 (×3): 2
  Filled 2021-11-25 (×3): qty 2

## 2021-11-25 MED ORDER — HYDRALAZINE HCL 20 MG/ML IJ SOLN
10.0000 mg | INTRAMUSCULAR | Status: DC | PRN
  Administered 2021-11-26: 10 mg via INTRAVENOUS
  Filled 2021-11-25: qty 1

## 2021-11-25 MED ORDER — ENOXAPARIN SODIUM 120 MG/0.8ML IJ SOSY
1.0000 mg/kg | PREFILLED_SYRINGE | Freq: Two times a day (BID) | INTRAMUSCULAR | Status: DC
  Administered 2021-11-25: 108 mg via SUBCUTANEOUS
  Filled 2021-11-25 (×2): qty 0.72

## 2021-11-25 MED ORDER — FREE WATER
200.0000 mL | Status: DC
  Administered 2021-11-25 – 2021-11-27 (×12): 200 mL

## 2021-11-25 MED ORDER — FUROSEMIDE 10 MG/ML IJ SOLN
40.0000 mg | Freq: Two times a day (BID) | INTRAMUSCULAR | Status: AC
Start: 2021-11-25 — End: 2021-11-25
  Administered 2021-11-25 (×2): 40 mg via INTRAVENOUS
  Filled 2021-11-25 (×2): qty 4

## 2021-11-25 MED ORDER — PRIMIDONE 50 MG PO TABS
50.0000 mg | ORAL_TABLET | Freq: Two times a day (BID) | ORAL | Status: AC
  Administered 2021-11-25 – 2021-11-26 (×2): 50 mg
  Filled 2021-11-25 (×2): qty 1

## 2021-11-25 MED ORDER — FENTANYL CITRATE PF 50 MCG/ML IJ SOSY
25.0000 ug | PREFILLED_SYRINGE | Freq: Once | INTRAMUSCULAR | Status: AC
  Administered 2021-11-25: 25 ug via INTRAVENOUS

## 2021-11-25 MED ORDER — FENTANYL CITRATE PF 50 MCG/ML IJ SOSY
25.0000 ug | PREFILLED_SYRINGE | INTRAMUSCULAR | Status: DC | PRN
  Administered 2021-11-25 (×4): 100 ug via INTRAVENOUS
  Filled 2021-11-25 (×4): qty 2

## 2021-11-25 MED ORDER — FENTANYL BOLUS VIA INFUSION: 25.0000 ug | INTRAVENOUS | Status: DC | PRN

## 2021-11-25 MED ORDER — FENTANYL 2500MCG IN NS 250ML (10MCG/ML) PREMIX INFUSION
25.0000 ug/h | INTRAVENOUS | Status: DC
  Administered 2021-11-25: 25 ug/h via INTRAVENOUS
  Administered 2021-11-27: 75 ug/h via INTRAVENOUS
  Filled 2021-11-25 (×2): qty 250

## 2021-11-25 NOTE — Progress Notes (Signed)
[place] for heparin dosing  Indication: DVT  Allergies  Allergen Reactions   Dilaudid [Hydromorphone] Other (See Comments)    Respiratory Arrest!!!!   Bactrim [Sulfamethoxazole-Trimethoprim] Rash   Benadryl [Diphenhydramine] Other (See Comments)    Muscle spasms   Phenobarbital Other (See Comments)    From childhood- reaction not recalled at this time   Penicillins Other (See Comments)    From childhood- reaction not recalled at this time    Patient Measurements: Height: _0  (185.4 cm) Weight: 107.7 kg (237 lb 7 oz) IBW/kg (Calculated) : 79.9 Heparin Dosing Weight: 102kg   Vital Signs: Temp: 98 F (36.7 C) (07/27 0740) Temp Source: Axillary (07/27 0740) BP: 123/68 (07/27 0700) Pulse Rate: 103 (07/27 0700)  Labs: Recent Labs    11/10/2021 0052 11/24/21 0629 11/24/21 2132 11/25/21 0601  HGB 8.8* 9.0*  --  8.4*  HCT 28.2* 28.9*  --  28.4*  PLT 84* 79*  --  75*  HEPARINUNFRC  --   --  0.51 0.67  CREATININE 1.13 1.29*  --  1.03     Estimated Creatinine Clearance: 88.3 mL/min (by C-G formula based on SCr of 1.03 mg/dL).   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 Platelets trending down but no bleeding. Hemoglobin has been stable at approximately 8-9.   Heparin level therapeutic (0.51) on infusion at 1650 units/hr. No  bleeding noted.  Second anti-Xa level therapeutic 0.67 while on infusion at 1650 units/hr. No overt bleeding noted.   Goal of Therapy:  Heparin level 0.3-0.7 units/ml Monitor platelets by anticoagulation protocol: Yes   Plan:  Patient is at the upper limit of therapeutic range and had a significant rise in heparin level over approximately 8 hours between levels.   Decrease heparin infusion to 1600 units/hr. Will check repeat heparin level today at 16:00 post heparin rate change.   Esmeralda Arthur, PharmD  11/25/2021, 08:06

## 2021-11-25 NOTE — Progress Notes (Signed)
ANTICOAGULATION CONSULT NOTE - Follow Up Consult  Pharmacy Consult for Heparin  Indication: DVT  Allergies  Allergen Reactions   Dilaudid [Hydromorphone] Other (See Comments)    Respiratory Arrest!!!!   Gabapentin     Significant hallucinations and sleep walking    Bactrim [Sulfamethoxazole-Trimethoprim] Rash   Benadryl [Diphenhydramine] Other (See Comments)    Muscle spasms   Phenobarbital Other (See Comments)    From childhood- reaction not recalled at this time   Penicillins Other (See Comments)    From childhood- reaction not recalled at this time    Patient Measurements: Height: _0  (185.4 cm) Weight: 107.7 kg (237 lb 7 oz) IBW/kg (Calculated) : 79.9 Heparin Dosing Weight: 102kg   Vital Signs: Temp: 99.3 F (37.4 C) (07/27 1119) Temp Source: Axillary (07/27 1119) BP: 120/79 (07/27 1045) Pulse Rate: 104 (07/27 0900)  Labs: Recent Labs    11/16/2021 0052 11/24/21 0629 11/24/21 2132 11/25/21 0601  HGB 8.8* 9.0*  --  8.4*  HCT 28.2* 28.9*  --  28.4*  PLT 84* 79*  --  75*  HEPARINUNFRC  --   --  0.51 0.67  CREATININE 1.13 1.29*  --  1.03    Estimated Creatinine Clearance: 88.3 mL/min (by C-G formula based on SCr of 1.03 mg/dL).   Assessment: Patient has had two therapeutic anti-Xa levels while on heparin for the treatment of his DVT. He is currently on primidone for the treatment of his essential tremor. Dr. Lake Bells would like to transition patient onto Eliquis. Primidone is contraindicated with Eliquis due to the induction of Eliquis metabolism.   Patient's platelets have been trending down but no overt bleeding noted.   Goal of Therapy:  Monitor platelets by anticoagulation protocol: Yes   Plan  Will transition patient to lovenox from heparin on 7/27 @ 20:00. Continue lovenox in order to allow for primidone tapering to avoid withdrawal symptoms. Primidone will be taper downed to 48m BID x 2 doses. Primidone will be completed on 7/28.   Primidone's  active metabolite is phenobarbital with half life up to 160 hours. In order to allow for this to wash out of patient's system will likely continue Lovenox for 2-3 then plan to transition to Eliquis 167mBID for several days.   Recommend a load due to possibility of phenobarbital active metabolite inducing metabolism of Eliquis. However, monitor platelets and for active signs and symptoms of bleeding.   Discontinued aspirin after contacting outpatient cardiologist. Cardiologist recommended to continue plavix with therapeutic anticoagulation.   HeVentura SellersPharmD  11/25/2021,1:01 PM

## 2021-11-25 NOTE — Progress Notes (Signed)
Per Elink, advanced ETT 3 cm to 27 at lip. No adverse reactions. Will continue to monitor.

## 2021-11-25 NOTE — Progress Notes (Signed)
Inpatient Diabetes Program Recommendations  AACE/ADA: New Consensus Statement on Inpatient Glycemic Control (2015)  Target Ranges:  Prepandial:   less than 140 mg/dL      Peak postprandial:   less than 180 mg/dL (1-2 hours)      Critically ill patients:  140 - 180 mg/dL   Lab Results  Component Value Date   GLUCAP 179 (H) 11/25/2021   HGBA1C 5.7 (H) 11/24/2021    Review of Glycemic Control  Latest Reference Range & Units 11/24/21 15:31 11/24/21 19:17 11/24/21 22:10 11/24/21 23:15 11/25/21 03:16 11/25/21 07:37  Glucose-Capillary 70 - 99 mg/dL 178 (H) 196 (H) 167 (H) 128 (H) 197 (H) 179 (H)   Diabetes history: None Current orders for Inpatient glycemic control:  Novolog resistant 0-20 units q 4 hours Osmolite 65 ml/hr  Inpatient Diabetes Program Recommendations:    Note blood glucose increased with tube feeds.  Novolog resistant correction started overnight.  If blood sugars increase >180 mg/dL, may consider adding Novolog tube feed coverage 3 units q 4 hours and reduce Novolog correction to sensitive q 4 hours.    Thanks,  Adah Perl, RN, BC-ADM Inpatient Diabetes Coordinator Pager [phone]  (8a-5p)

## 2021-11-25 NOTE — Progress Notes (Addendum)
Nutrition Follow-up  DOCUMENTATION CODES:   Non-severe (moderate) malnutrition in context of chronic illness  INTERVENTION:  - continue Osmolite 1.5 @ 65 ml/hr with 45 ml Prosource TF TID. - free water flush to continue to be per CCM.   NUTRITION DIAGNOSIS:   Moderate Malnutrition (in the context of chronic illness) related to poor appetite as evidenced by mild muscle depletion, mild fat depletion, percent weight loss. -ongoing  GOAL:   Patient will meet greater than or equal to 90% of their needs -met with TF regimen  MONITOR:   Vent status, TF tolerance, Labs, Weight trends  ASSESSMENT:   Pt with hx of dementia, CAD, CKD3, gout, GERD, HTN, HLD, prior strokes, prior MI and dysphagia presented to ED from rehab facility with SOB and pneumonia. Recently admitted with similar work up.  Patient remains intubated. Cortrak in R nare from 7/25 (distal stomach vs first portion of duodenum per abdominal x-ray that day).  Patient is receiving Osmolite 1.5 @ 65 ml/hr with 45 ml Prosource TF TID and 30 ml free water every 4 hours. This regimen is providing 2460 kcal, 138 grams protein, and 1369 ml free water.   Order in place for 200 ml free water every 4 hours starting today; this will provide 1200 ml/day.   Weight has been mainly stable throughout hospitalization. Mild pitting edema to RUE and BLE and deep pitting edema to LUE documented in the edema section of flow sheet.   EGD done 7/25 which showed mild esophageal stenosis (dilation done at that time) and gastritis.    Patient is currently intubated on ventilator support MV: 10.7 L/min Temp (24hrs), Avg:98.6 F (37 C), Min:97.7 F (36.5 C), Max:99.3 F (37.4 C) Propofol: none BP: 171/91 and MAP: 113    Labs reviewed; CBGs: 197, 179, 157 mg/dl, Na: 150 mmol/l, Cl: 113 mmol/l, BUN: 26 mg/dl, Ca: 8.4 mg/dl.  Medications reviewed; 40 mg IV lasix BID, sliding scale novolog, 40 mg protonix/day per tube, 17 g miralax/day, 40 mEq  Klor-Con x2 doses 7/26, 250 mg florastor BID per tube, 2 tablets senokot BID.  Drip; heparin @ 1600 units/hr.   Diet Order:   Diet Order             Diet NPO time specified Except for: Sips with Meds  Diet effective now                   EDUCATION NEEDS:   Not appropriate for education at this time  Skin:  Skin Assessment: Reviewed RN Assessment  Last BM:  7/22  Height:   Ht Readings from Last 1 Encounters:  11/21/21 _0  (1.854 m)    Weight:   Wt Readings from Last 1 Encounters:  11/25/21 107.7 kg     BMI:  Body mass index is 31.33 kg/m.  Estimated Nutritional Needs:  Kcal:  2400-2600 kcal/d Protein:  120-140g/d Fluid:  2.4L/d     Jarome Matin, MS, RD, LDN, CNSC Registered Dietitian II Inpatient Clinical Nutrition RD pager # and on-call/weekend pager # available in [place]

## 2021-11-25 NOTE — Progress Notes (Signed)
NAME:  Mark Thomas, MRN:  [id number], DOB:  31-Aug-1950, LOS: 26 ADMISSION DATE:  11/13/2021, CONSULTATION DATE:  11/20/21  REFERRING MD:  Waldron Labs, TRH, CHIEF COMPLAINT: Recurrent pneumonia  History of Present Illness:  71 year old never smoker that we are asked to evaluate for recurrent pneumonia. He was initially seen as a pulmonary consult 09/15/2018 for ILD and recurrent pneumonia.  He subsequently developed left hemi- opacity of his lung and was hospitalized multiple times.  He was treated with prolonged and multiple courses of IV antibiotics.  He required oxygen and was subsequently weaned off.  He is now a resident of [facility] for the last 6 weeks.  Bronchoscopy on 6/7 with BAL was negative for AFB, fungal and bacterial cultures. Swallowing evaluation 07/2021 showed mild aspiration risk and mild dysphagia  Was last hospitalization for 6/20 to 6/27 for recurrent pneumonia with a component of acute diastolic heart failure.  He was diuresed, treated with cefepime and steroids and discharged on 3 L of oxygen. He developed shortness of breath, right-sided chest pain and hypoxia for the last 4 days prior to this admission.  He was started on ceftriaxone and doxycycline.  He was also on Lasix and prednisone .  Chest x-ray 7/13 showed right more than left infiltrate. He was seen by our nurse practitioner on 7/14 for an office visit.  Chest x-ray 7/14 showed asymmetric airspace disease in the right mid and lower lung  He developed subjective fevers and cough and sought ED attention on 7/17 Labs showed leukocytosis 20 K increased from 12, creatinine 1.5, BNP 299, lactate was 2.9 and 2.5. Chest x-ray showed worsening infiltrates on the right in a patchy pattern consistent with bronchopneumonia.  He was started on cefepime and vancomycin and PCCM consulted.  PCCM following peripherally. Called again in the early a.m. of 7/22 after patient became acutely hypoxemic.  Patient awoke and panic with  increased oxygen requirement from 4 L to 15 L, nonrebreather placed.  Transitioned to BiPAP. ABG 7.41/47/137/30.4  Pertinent  Medical History  Atrial fibrillation status post Watchman device CAD status post stent, on Plavix Sick sinus syndrome status post pacemaker RLS, essential tremor HFpEF Chronic respiratory failure with hypoxia CKD stage III  Significant Hospital Events: Including procedures, antibiotic start and stop dates in addition to other pertinent events   CT chest without con 06/2021 patchy groundglass nodules in right upper lobe CTA chest 07/2021 mild peripheral interstitial opacities in lower lung?  ILD, patchy groundglass opacities in bilateral upper lobes. CT chest without con 10/2021 left lung opacities have improved, worsening patchy groundglass opacity throughout the right lung, trace effusions , distal esophageal wall thickening with moderate hiatal hernia. Swallow evaluation/MBS 07/2021 mild aspiration risk, no restriction 7/19 Modified barium swallow. Normal swallowing function. Barium was retained in esophagus and did not pas GE junction. Cefepime>Unasyn 7/22 PCCM called for hypoxia, patient started on BiPAP    Significant studies: 7/17>> CXR: Worsening patchy right-sided opacities 7/17>> CT chest: Worsening patchy opacities in the right lung-overall improvement in left lung opacities. 7/20>> barium esophagogram: Marked narrowed appearance of distal esophagus   Significant microbiology data: 6/07>> BAL culture: Negative  6/07>> BAL AFB smear: Negative 6/07>> BAL AFB culture: Pending 6/07>> BAL fungal culture: Negative 6/07>> BAL cytology: No malignant cells identified 7/17>> COVID PCR: Negative 7/17>> blood culture: Negative   Procedures: 6/07>> bronchoscopy   Interim History / Subjective:  Started on q4 novolog rSSI for tube feed coverage given elevated CBG. Intubated and sedated.  Objective: Afebrile,  MAP>65. Highest BP up to 187/108.  Blood pressure  107/68, pulse (!) 105, temperature 98.8 F (37.1 C), temperature source Oral, resp. rate 18, height _0  (1.854 m), weight 107.7 kg, SpO2 91 %.    Vent Mode: PRVC FiO2 (%):  [50 %-60 %] 50 % Set Rate:  [14 bmp-26 bmp] 14 bmp Vt Set:  [480 mL] 480 mL PEEP:  [5 cmH20-8 cmH20] 8 cmH20 Pressure Support:  [10 cmH20] 10 cmH20 Plateau Pressure:  [19 cmH20-32 cmH20] 20 cmH20   Intake/Output Summary (Last 24 hours) at 11/25/2021 0708 Last data filed at 11/25/2021 0600 Gross per 24 hour  Intake 1954.09 ml  Output 600 ml  Net 1354.09 ml    Filed Weights   11/13/2021 0500 11/24/21 0500 11/25/21 0500  Weight: 105.6 kg 107.7 kg 107.7 kg   Intake: 2.015 L Urine: 600 cc Output: 600 cc Net: +1.4 L   Examination:  General: elderly male, intubated and sedated.  HEENT: NCAT, intubated and sedated Neuro: sedated, no response to voice with eye opening to sternal rub.  CV: regular rate, irregular rhythm, systolic murmur present, warm extremities PULM: rhonchi present diffusely  GI: normal bowel sounds, soft abdomen Extremities: LUE more fuller and tighter then right extremity but improved from yesterday.  Skin: bruises and petechiae present on bilateral upper extremities.   Labs: Labs show WBC at 12.8 k, Hgb at 8.4, MCV 104.0. Plt 75. BMP shows Na 150, K at 4.7, Cr 1.03, and Ca at 8.4. Mag at 2.0. CBG was 197. Resolved Hospital Problem list    Assessment & Plan:  Acute on chronic respiratory failure with hypoxia Recurrent pneumonia.   Patient is s/p Unasyn for 7 days. New leukocytosis may be secondary to DVT. Pt afebrile.  -Continue vent support with volume and pressure protection given suspicion of ARDS. Patient on pressure support this am. Given high requirement, will continue full vent support now and plan for safe extubation when able. Will give lasix 40 mg BID.  -Start fentanyl pushes instead of continuous fentanyl -Continue tube feeds via cortrak -CTM  Hypernatremia Na 150 this am.  Free water deficit calculated at 3.4 L. -Start free water flushes 200 cc q4hrs -CTM sodium daily  Elevated Creatinine (resolved) Hypokalemia (resolved) Appears to be in setting of lasix use. K at 4.7. -CTM   Anemia  Hgb at 8.4. Has multiple illness recently which are playing a role. MCV 100.7. Given gradual decline, could have iron deficiency playing a role with concomitant PY09 or folic acid deficiency.  -Monitor hgb, and transfuse as needed with goal of 8. MMA pending  Chronic HFpEF History of SVT History of PAF status post watchman's device (03/22/2019) History of CAD status post multiple PCI's History of ASD (status post closure) Moderate AS Chronic. On metoprolol 37.5 mg p.o. twice daily. Not on AC due to Watchman device placement, On plavix and aspirin.  -Continue metoprolol to 25 mg BID. Uptitrate as tolerated to home dose. -Goal K above 4, goal MG above 2  History of vascular dementia History of CVA -Continue Namenda and Seroquel.  -Continue aspirin, and statin  GERD -Continue PPI daily  Constipation Last bowel movement 07/22. Started tube feeds 07/25.  -Continue bowel regimen with daily Miralax and senna.  Oral thrush -s/p IV Diflucan Day 6/6  Left Upper Extremity DVT DVT present in left axillary and left brachial veins. Left basilic vein has superficial vein thrombosis.  -Started on heparin gtt. Plan to switch to oral AC.   Best Practice (  right click and "Reselect all SmartList Selections" daily)  Diet/type: tubefeeds DVT prophylaxis: On heparin currently GI prophylaxis: PPI Lines: N/A Continuous:  Tube feeds 65 cc/hr, fentanyl 50 mcg/hr Foley:  N/A Code Status:  full code Last date of multidisciplinary goals of care discussion [11/24/21]  Idamae Schuller, MD [facility] Internal Medicine Residency, PGY-2  Please see [email or web address] for pager details If no response, please call [phone] After hours, please call Elink at  [phone]

## 2021-11-25 NOTE — Progress Notes (Signed)
Attending:    Subjective: Started on sliding scale insulin Following commands Passing SBT today No bowel movement yet  Objective: Vitals:   11/25/21 0700 11/25/21 0740 11/25/21 0800 11/25/21 0900  BP: 123/68  128/74 135/67  Pulse: (!) 103  96 (!) 104  Resp: _0 Temp:  98 F (36.7 C)    TempSrc:  Axillary    SpO2: 93%  91% 91%  Weight:      Height:       Vent Mode: PSV;CPAP FiO2 (%):  [50 %-60 %] 50 % Set Rate:  [14 bmp] 14 bmp Vt Set:  [480 mL] 480 mL PEEP:  [5 cmH20-8 cmH20] 8 cmH20 Pressure Support:  [10 cmH20] 10 cmH20 Plateau Pressure:  [19 cmH20-32 cmH20] 30 cmH20  Intake/Output Summary (Last 24 hours) at 11/25/2021 1031 Last data filed at 11/25/2021 0900 Gross per 24 hour  Intake 1903.57 ml  Output 400 ml  Net 1503.57 ml    General:  In bed on vent HENT: NCAT ETT in place PULM: CTA B, vent supported breathing CV: RRR, no mgr GI: BS+, soft, nontender MSK: normal bulk and tone Neuro: sedated on vent    CBC    Component Value Date/Time   WBC 12.8 (H) 11/25/2021 0601   RBC 2.73 (L) 11/25/2021 0601   HGB 8.4 (L) 11/25/2021 0601   HCT 28.4 (L) 11/25/2021 0601   PLT 75 (L) 11/25/2021 0601   MCV 104.0 (H) 11/25/2021 0601   MCH 30.8 11/25/2021 0601   MCHC 29.6 (L) 11/25/2021 0601   RDW 17.0 (H) 11/25/2021 0601   LYMPHSABS 1.3 11/11/2021 0052   MONOABS 0.5 11/13/2021 0052   EOSABS 0.2 11/06/2021 0052   BASOSABS 0.0 11/02/2021 0052    BMET    Component Value Date/Time   NA 150 (H) 11/25/2021 0601   NA 137 03/29/2021 1158   K 4.7 11/25/2021 0601   CL 113 (H) 11/25/2021 0601   CO2 33 (H) 11/25/2021 0601   GLUCOSE 218 (H) 11/25/2021 0601   BUN 26 (H) 11/25/2021 0601   BUN 23 03/29/2021 1158   CREATININE 1.03 11/25/2021 0601   CREATININE 0.99 09/28/2021 0000   CALCIUM 8.4 (L) 11/25/2021 0601   GFRNONAA >60 11/25/2021 0601    CXR images none today  Impression/Plan: LUE DVT> change heparin to eliquis (if able to stop  primadone) Essential tremor>  hold primadone CAD s/p PCI> will discuss anti-platelet/anticoagulation option with cardiology and pharmacy, would prefer one antiplatelet (instead of 2) if able to start eliquis Acute respiratory failure with hypoxemia > wean vent today, consider extubation, SBT today Aspiration pneumonia> monitor off antibiotics, fever noted but overall  Hypernatremia> give free water via tube Thrombocytopenia in setting of sepsis, critical illness, unlikely to be HIT given timing  Wife updated bedside  My cc time 31 minutes  Roselie Awkward, MD [place] [facility] Pager: [phone] Cell: [phone] After 7pm: [phone]

## 2021-11-25 NOTE — Progress Notes (Signed)
eLink Physician-Brief Progress Note Patient Name: CLARION MOONEYHAN DOB: 07-18-1950 MRN: [id number]   Date of Service  11/25/2021  HPI/Events of Note  71 y/o M with Acute on chronic respiratory failure with hypoxia, Recurrent pneumonia.  Called by bedside RN for agitation, ETT position   Plans: -ETT about 6.7cm above carina- push down 3-4cm  -was on Propofol, will resume for now -prn Fentanyl pushes  -s/p Unasyn for 7 days. New leukocytosis thought to be secondary to DVT. Defer Abx changes to AM team  -Continue vent support - suspicion of ARDS -hypernatremia may contribute to agitation- free water via NGT 200 cc q4hrs -CXR overloaded vs ARDS picture- can try some Lasix +/- Metolazone for  pulm edema + hypernatremia if tube adjustment does not improve agitation.   eICU Interventions          Sebastyan Snodgrass N Kalyssa Anker 11/25/2021, 10:32 PM

## 2021-11-26 DIAGNOSIS — J189 Pneumonia, unspecified organism: Secondary | ICD-10-CM | POA: Diagnosis not present

## 2021-11-26 LAB — BASIC METABOLIC PANEL
Anion gap: 8 (ref 5–15)
Anion gap: 5 (ref 5–15)
BUN: 36 mg/dL — ABNORMAL HIGH (ref 8–23)
BUN: 36 mg/dL — ABNORMAL HIGH (ref 8–23)
CO2: 31 mmol/L (ref 22–32)
CO2: 35 mmol/L — ABNORMAL HIGH (ref 22–32)
Calcium: 7.9 mg/dL — ABNORMAL LOW (ref 8.9–10.3)
Calcium: 8.3 mg/dL — ABNORMAL LOW (ref 8.9–10.3)
Chloride: 108 mmol/L (ref 98–111)
Chloride: 106 mmol/L (ref 98–111)
Creatinine, Ser: 1.61 mg/dL — ABNORMAL HIGH (ref 0.61–1.24)
Creatinine, Ser: 1.24 mg/dL (ref 0.61–1.24)
GFR, Estimated: 46 mL/min — ABNORMAL LOW (ref >60)
GFR, Estimated: >60 mL/min (ref >60)
Glucose, Bld: 195 mg/dL — ABNORMAL HIGH (ref 70–99)
Glucose, Bld: 229 mg/dL — ABNORMAL HIGH (ref 70–99)
Potassium: 5.8 mmol/L — ABNORMAL HIGH (ref 3.5–5.1)
Potassium: 5.3 mmol/L — ABNORMAL HIGH (ref 3.5–5.1)
Sodium: 147 mmol/L — ABNORMAL HIGH (ref 135–145)
Sodium: 146 mmol/L — ABNORMAL HIGH (ref 135–145)

## 2021-11-26 LAB — CBC
HCT: 26.5 % — ABNORMAL LOW (ref 39.0–52.0)
Hemoglobin: 7.9 g/dL — ABNORMAL LOW (ref 13.0–17.0)
MCH: 31.3 pg (ref 26.0–34.0)
MCHC: 29.8 g/dL — ABNORMAL LOW (ref 30.0–36.0)
MCV: 105.2 fL — ABNORMAL HIGH (ref 80.0–100.0)
Platelets: 74 10*3/uL — ABNORMAL LOW (ref 150–400)
RBC: 2.52 MIL/uL — ABNORMAL LOW (ref 4.22–5.81)
RDW: 17.2 % — ABNORMAL HIGH (ref 11.5–15.5)
WBC: 14.8 10*3/uL — ABNORMAL HIGH (ref 4.0–10.5)
nRBC: 0.4 % — ABNORMAL HIGH (ref 0.0–0.2)

## 2021-11-26 LAB — GLUCOSE, CAPILLARY
Glucose-Capillary: 164 mg/dL — ABNORMAL HIGH (ref 70–99)
Glucose-Capillary: 196 mg/dL — ABNORMAL HIGH (ref 70–99)
Glucose-Capillary: 188 mg/dL — ABNORMAL HIGH (ref 70–99)
Glucose-Capillary: 223 mg/dL — ABNORMAL HIGH (ref 70–99)
Glucose-Capillary: 213 mg/dL — ABNORMAL HIGH (ref 70–99)
Glucose-Capillary: 202 mg/dL — ABNORMAL HIGH (ref 70–99)
Glucose-Capillary: 210 mg/dL — ABNORMAL HIGH (ref 70–99)

## 2021-11-26 MED ORDER — BISACODYL 10 MG RE SUPP
10.0000 mg | Freq: Once | RECTAL | Status: AC
  Administered 2021-11-26: 10 mg via RECTAL
  Filled 2021-11-26: qty 1

## 2021-11-26 MED ORDER — FONDAPARINUX SODIUM 10 MG/0.8ML ~~LOC~~ SOLN
10.0000 mg | SUBCUTANEOUS | Status: DC
  Administered 2021-11-26: 10 mg via SUBCUTANEOUS
  Filled 2021-11-26 (×2): qty 0.8

## 2021-11-26 MED ORDER — INSULIN ASPART 100 UNIT/ML IJ SOLN
4.0000 [IU] | INTRAMUSCULAR | Status: DC
  Administered 2021-11-26 – 2021-11-27 (×7): 4 [IU] via SUBCUTANEOUS

## 2021-11-26 MED ORDER — INSULIN ASPART 100 UNIT/ML IJ SOLN: 0.0000 [IU] | Freq: Every day | INTRAMUSCULAR | Status: DC

## 2021-11-26 MED ORDER — INSULIN ASPART 100 UNIT/ML IJ SOLN
0.0000 [IU] | Freq: Three times a day (TID) | INTRAMUSCULAR | Status: DC
  Administered 2021-11-26 (×2): 5 [IU] via SUBCUTANEOUS

## 2021-11-26 MED ORDER — SODIUM ZIRCONIUM CYCLOSILICATE 10 G PO PACK
10.0000 g | PACK | Freq: Once | ORAL | Status: AC
  Administered 2021-11-26: 10 g
  Filled 2021-11-26: qty 1

## 2021-11-26 NOTE — Progress Notes (Signed)
Pharmacy Heparin Induced Thrombocytopenia (HIT) Note:  Mark Thomas is an 71 y.o. male being evaluated for HIT. Enoxaparin was started dvt ppx and baseline platelets were 198.   HIT labs were ordered on 7/28 when platelets dropped to 74.  Auto-populate labs: No results found for: "HEPINDPLTAB", "SRALOWDOSEHP", "SRAHIGHDOSEH"   CALCULATE SCORE:  4Ts (see the HIT Algorithm) Score  Thrombocytopenia 2  Timing 2  Thrombosis 2  Other causes of thrombocytopenia 2  Total 8     Recommendations (A or B) are based on available lab results (HIT antibody and/or SRA) and the HIT algorithm    A. HIT antibody result available - ordered  Possible HIT   Order SRA:  No Discontinue heparin / LMWH:  Yes Initiate alternative anticoagulation:  Yes Document heparin allergy:  Yes   B. SRA result availability  SRA not available   Name of MD Contacted: [place] (Discussed with provider) Labs ordered:  SRA not needed HIT ab sent Heparin allergy:  Heparin allergy documented or updated. Anticoagulation plans:  Begin alternative anticoagulation with arixtra   Comments (List any alternative plans or if there are contraindications to therapy)   Wynell Balloon 11/26/2021, 10:28 AM

## 2021-11-26 NOTE — TOC Progression Note (Signed)
Transition of Care [facility]) - Progression Note    Patient Details  Name: ERIBERTO FELCH MRN: [id number] Date of Birth: 1951-04-09  [facility] CM/SW [place], RN Phone Number:[phone]  11/26/2021, 12:29 PM  Clinical Narrative:    TOC continues to follow for patient that remains critically ill. No disposition planning at this time. Patient remains on vent.    Expected Discharge Plan: [place] Barriers to Discharge: Continued Medical Work up  Expected Discharge Plan and Services Expected Discharge Plan: [place]   Discharge Planning Services: CM Consult Post Acute Care Choice: [place] arrangements for the past 2 months: [place]: PT, OT           Social Determinants of Health (SDOH) Interventions    Readmission Risk Interventions    10/12/2021    3:48 PM  Readmission Risk Prevention Plan  Transportation Screening Complete  Medication Review (RN Care Manager) Complete  PCP or Specialist appointment within 3-5 days of discharge Complete  HRI or Home Care Consult Complete  SW Recovery Care/Counseling Consult Complete  Palliative Care Screening Not Applicable  Skilled Nursing Facility Complete

## 2021-11-26 NOTE — Progress Notes (Signed)
Attending:    Subjective: Agitated overnight Placed back on fentanyl and propofol infusions TM 101.3, afebrile this morning, received APAP   Objective: Vitals:   11/26/21 0457 11/26/21 0500 11/26/21 0738 11/26/21 0845  BP:  (!) 97/55  (!) 132/57  Pulse:  77    Resp:  11  (!) 21  Temp:   98.6 F (37 C)   TempSrc:   Axillary   SpO2:  96%    Weight: 105.1 kg     Height:       Vent Mode: PRVC FiO2 (%):  [40 %-60 %] 50 % Set Rate:  [14 bmp] 14 bmp Vt Set:  [480 mL] 480 mL PEEP:  [5 cmH20-8 cmH20] 8 cmH20 Plateau Pressure:  [23 cmH20-30 cmH20] 26 cmH20  Intake/Output Summary (Last 24 hours) at 11/26/2021 5456 Last data filed at 11/26/2021 0700 Gross per 24 hour  Intake 1987.59 ml  Output 2825 ml  Net -837.41 ml    General:  In bed on vent HENT: NCAT ETT in place PULM: CTA B, vent supported breathing CV: RRR, no mgr GI: BS+, soft, nontender MSK: normal bulk and tone Neuro: sedated on vent    CBC    Component Value Date/Time   WBC 14.8 (H) 11/26/2021 0045   RBC 2.52 (L) 11/26/2021 0045   HGB 7.9 (L) 11/26/2021 0045   HCT 26.5 (L) 11/26/2021 0045   PLT 74 (L) 11/26/2021 0045   MCV 105.2 (H) 11/26/2021 0045   MCH 31.3 11/26/2021 0045   MCHC 29.8 (L) 11/26/2021 0045   RDW 17.2 (H) 11/26/2021 0045   LYMPHSABS 1.3 11/24/2021 0052   MONOABS 0.5 11/18/2021 0052   EOSABS 0.2 11/16/2021 0052   BASOSABS 0.0 11/21/2021 0052    BMET    Component Value Date/Time   NA 147 (H) 11/26/2021 0632   NA 137 03/29/2021 1158   K 5.8 (H) 11/26/2021 0632   CL 108 11/26/2021 0632   CO2 31 11/26/2021 0632   GLUCOSE 195 (H) 11/26/2021 0632   BUN 36 (H) 11/26/2021 0632   BUN 23 03/29/2021 1158   CREATININE 1.61 (H) 11/26/2021 0632   CREATININE 0.99 09/28/2021 0000   CALCIUM 7.9 (L) 11/26/2021 0632   GFRNONAA 46 (L) 11/26/2021 0632    CXR images bilateral air space disease, ett in place, personally reviewed  Impression/Plan: Aspiration pneumonia with worsening fever, WBC>  send respiratory culture, hold antibiotics for now Acute respiratory failure with hypoxemia, ARDS > hold wean today, continue full vent support, hold lasix today AKI related to diuresis> hold diuresis, monitor BMET, UOP Hypernatremia > continue free water 200cc q4h Thrombocytopenia> monitor for bleeding, most likely due to sepsis, check HITT Antibody, change heparin to fonduparinox while awaiting that test to come back Constipation> add suppository today Anemia without bleeding> monitor for bleeding, transfuse for Hgb <7gm/dL DVT> fonduparinox Atrial fibrillation> metoprolol continue 64m bid Essential tremor> wean off primidone  Family updated at length, discussed goals of care and chance of survival.  Given his prolonged illness this year and recurrent pneumonias and dementia and deconditioned state I do not anticipate he will survive this.  They voiced understanding and will address code status and possibly withdrawing care tomorrow.  My cc time 35 minutes  BRoselie Awkward MD [place] [facility]M Pager: [phone]: [phone] 7pm: [phone]

## 2021-11-26 NOTE — Progress Notes (Signed)
NAME:  Mark Thomas, MRN:  [id number], DOB:  10/27/1950, LOS: 7 ADMISSION DATE:  11/12/2021, CONSULTATION DATE:  11/20/21  REFERRING MD:  Waldron Labs, TRH, CHIEF COMPLAINT: Recurrent pneumonia  History of Present Illness:  71 year old never smoker that we are asked to evaluate for recurrent pneumonia. He was initially seen as a pulmonary consult 09/15/2018 for ILD and recurrent pneumonia.  He subsequently developed left hemi- opacity of his lung and was hospitalized multiple times.  He was treated with prolonged and multiple courses of IV antibiotics.  He required oxygen and was subsequently weaned off.  He is now a resident of [facility] for the last 6 weeks.  Bronchoscopy on 6/7 with BAL was negative for AFB, fungal and bacterial cultures. Swallowing evaluation 07/2021 showed mild aspiration risk and mild dysphagia  Was last hospitalization for 6/20 to 6/27 for recurrent pneumonia with a component of acute diastolic heart failure.  He was diuresed, treated with cefepime and steroids and discharged on 3 L of oxygen. He developed shortness of breath, right-sided chest pain and hypoxia for the last 4 days prior to this admission.  He was started on ceftriaxone and doxycycline.  He was also on Lasix and prednisone .  Chest x-ray 7/13 showed right more than left infiltrate. He was seen by our nurse practitioner on 7/14 for an office visit.  Chest x-ray 7/14 showed asymmetric airspace disease in the right mid and lower lung  He developed subjective fevers and cough and sought ED attention on 7/17 Labs showed leukocytosis 20 K increased from 12, creatinine 1.5, BNP 299, lactate was 2.9 and 2.5. Chest x-ray showed worsening infiltrates on the right in a patchy pattern consistent with bronchopneumonia.  He was started on cefepime and vancomycin and PCCM consulted.  PCCM following peripherally. Called again in the early a.m. of 7/22 after patient became acutely hypoxemic.  Patient awoke and panic with  increased oxygen requirement from 4 L to 15 L, nonrebreather placed.  Transitioned to BiPAP. ABG 7.41/47/137/30.4  Pertinent  Medical History  Atrial fibrillation status post Watchman device CAD status post stent, on Plavix Sick sinus syndrome status post pacemaker RLS, essential tremor HFpEF Chronic respiratory failure with hypoxia CKD stage III  Significant Hospital Events: Including procedures, antibiotic start and stop dates in addition to other pertinent events   CT chest without con 06/2021 patchy groundglass nodules in right upper lobe CTA chest 07/2021 mild peripheral interstitial opacities in lower lung?  ILD, patchy groundglass opacities in bilateral upper lobes. CT chest without con 10/2021 left lung opacities have improved, worsening patchy groundglass opacity throughout the right lung, trace effusions , distal esophageal wall thickening with moderate hiatal hernia. Swallow evaluation/MBS 07/2021 mild aspiration risk, no restriction 7/19 Modified barium swallow. Normal swallowing function. Barium was retained in esophagus and did not pas GE junction. Cefepime>Unasyn 7/22 PCCM called for hypoxia, patient started on BiPAP    Significant studies: 7/17>> CXR: Worsening patchy right-sided opacities 7/17>> CT chest: Worsening patchy opacities in the right lung-overall improvement in left lung opacities. 7/20>> barium esophagogram: Marked narrowed appearance of distal esophagus   Significant microbiology data: 6/07>> BAL culture: Negative  6/07>> BAL AFB smear: Negative 6/07>> BAL AFB culture: Pending 6/07>> BAL fungal culture: Negative 6/07>> BAL cytology: No malignant cells identified 7/17>> COVID PCR: Negative 7/17>> blood culture: Negative   Procedures: 6/07>> bronchoscopy   Interim History / Subjective:  Agitation overnight, required fentanyl and propofol started.  Objective: febrile overnight at 101.3, MAP>65.   Blood pressure Marland Kitchen)  97/55, pulse 77, temperature 99.1 F  (37.3 C), temperature source Axillary, resp. rate 11, height _0  (1.854 m), weight 105.1 kg, SpO2 96 %.    Vent Mode: PRVC FiO2 (%):  [40 %-60 %] 60 % Set Rate:  [14 bmp] 14 bmp Vt Set:  [480 mL] 480 mL PEEP:  [5 cmH20-8 cmH20] 8 cmH20 Pressure Support:  [10 cmH20] 10 cmH20 Plateau Pressure:  [23 cmH20-30 cmH20] 30 cmH20   Intake/Output Summary (Last 24 hours) at 11/26/2021 0708 Last data filed at 11/26/2021 0506 Gross per 24 hour  Intake 2097.34 ml  Output 2825 ml  Net -727.66 ml    Filed Weights   11/24/21 0500 11/25/21 0500 11/26/21 0457  Weight: 107.7 kg 107.7 kg 105.1 kg   Intake: 2.097 L Urine: 2.83 L Output: 2.83 L  Net: -727.7   Examination:  General: elderly male, intubated and sedated.  HEENT: NCAT, intubated and sedated Neuro: sedated, no response to voice with eye opening to sternal rub.  CV: regular rate, irregular rhythm, systolic murmur present, warm extremities PULM: rhonchi present diffusely  GI: normal bowel sounds, soft abdomen Extremities: LUE more fuller and tighter then right extremity but improved  Skin: bruises and petechiae present on bilateral upper extremities.   Labs: Labs show WBC at 14.8 k, Hgb at 7.9, MCV 105.2. Plt 74. BMP shows Na 150, K at 4.7, Cr 1.03, and Ca at 8.4. Mag at 2.0. CBG was 196 this am. Resolved Hospital Problem list    Assessment & Plan:  Acute on chronic respiratory failure with hypoxia Recurrent pneumonia.   Patient is s/p Unasyn for 7 days. Worsening leukocytosis and febrile overnight.  -Continue vent support with volume and pressure protection given suspicion of ARDS. Patient on pressure support this am. Given high requirement, will continue full vent support now and plan for safe extubation when able. Patient is s/p lasix with good output. CXR not improving with lasix confirming presence of ARDS. Will hold abx and monitor fevers and WBC.  -Continuous fentanyl and propofol -Continue tube feeds via  cortrak -CTM   Left Upper Extremity DVT Thrombocytopenia DVT present in left axillary and left brachial veins. Left basilic vein has superficial vein thrombosis.  -Heparin switched to Lovenox but changed to fondaparinux due to HIT concern. -HIT panel pending.   Hypernatremia Na 150 Yesterday. Free water deficit calculated at 3.4 L. Na 147 this am.  -Continue free water flushes 200 cc q4hrs -CTM sodium daily  AKI  Hyperkalemia (resolved) Cr 1.6. Baseline renal fxn normal. Appears to be in setting of lasix use. K at 5.8 appears to be in setting of AKI. Will give Lokelma and then repeat BMP at 2 pm.. -CTM   Anemia  Hgb at 7.9. Has multiple illness recently which are playing a role. MCV 100.7. Given gradual decline, could have iron deficiency playing a role with concomitant EM75 or folic acid deficiency.  -Monitor hgb, and transfuse as needed with goal of 7. MMA pending  Chronic HFpEF History of SVT History of PAF status post watchman's device (03/22/2019) History of CAD status post multiple PCI's History of ASD (status post closure) Moderate AS Chronic. On metoprolol 37.5 mg p.o. twice daily. Not on AC due to Watchman device placement, On plavix.  -Continue metoprolol to 25 mg BID. Uptitrate as tolerated to home dose. -Goal K above 4, goal MG above 2  History of vascular dementia History of CVA -Continue Namenda and Seroquel.  -Continue plavix and statin  Constipation Last bowel  movement 07/22. Started tube feeds 07/25.  -Continue bowel regimen with daily Miralax and senna. Add suppository today.   Hyperglycemia CBG elevated >180. Goal is 140-180. Was on tube feed SSI coverage. Changed to q4 hrs 4 units and SSI for better glycemic control. -CTM  GERD -Continue PPI daily  Best Practice (right click and "Reselect all SmartList Selections" daily)  Diet/type: tubefeeds DVT prophylaxis: Lovenox  GI prophylaxis: PPI Lines: N/A Continuous:  Tube feeds 65 cc/hr, fentanyl  50 mcg/hr, propofol Foley:  N/A Code Status:  full code Last date of multidisciplinary goals of care discussion [11/25/21]  Idamae Schuller, MD Tillie Rung. [facility] Internal Medicine Residency, PGY-2  Please see [email or web address] for pager details If no response, please call [phone] After hours, please call Elink at [phone]

## 2021-11-27 LAB — CBC
HCT: 28.1 % — ABNORMAL LOW (ref 39.0–52.0)
Hemoglobin: 8 g/dL — ABNORMAL LOW (ref 13.0–17.0)
MCH: 30.1 pg (ref 26.0–34.0)
MCHC: 28.5 g/dL — ABNORMAL LOW (ref 30.0–36.0)
MCV: 105.6 fL — ABNORMAL HIGH (ref 80.0–100.0)
Platelets: 80 10*3/uL — ABNORMAL LOW (ref 150–400)
RBC: 2.66 MIL/uL — ABNORMAL LOW (ref 4.22–5.81)
RDW: 16.9 % — ABNORMAL HIGH (ref 11.5–15.5)
WBC: 16.9 10*3/uL — ABNORMAL HIGH (ref 4.0–10.5)
nRBC: 0.3 % — ABNORMAL HIGH (ref 0.0–0.2)

## 2021-11-27 LAB — BASIC METABOLIC PANEL
Anion gap: 6 (ref 5–15)
BUN: 40 mg/dL — ABNORMAL HIGH (ref 8–23)
CO2: 35 mmol/L — ABNORMAL HIGH (ref 22–32)
Calcium: 8.3 mg/dL — ABNORMAL LOW (ref 8.9–10.3)
Chloride: 104 mmol/L (ref 98–111)
Creatinine, Ser: 1.25 mg/dL — ABNORMAL HIGH (ref 0.61–1.24)
GFR, Estimated: >60 mL/min (ref >60)
Glucose, Bld: 238 mg/dL — ABNORMAL HIGH (ref 70–99)
Potassium: 5.8 mmol/L — ABNORMAL HIGH (ref 3.5–5.1)
Sodium: 145 mmol/L (ref 135–145)

## 2021-11-27 LAB — GLUCOSE, CAPILLARY
Glucose-Capillary: 197 mg/dL — ABNORMAL HIGH (ref 70–99)
Glucose-Capillary: 220 mg/dL — ABNORMAL HIGH (ref 70–99)
Glucose-Capillary: 230 mg/dL — ABNORMAL HIGH (ref 70–99)
Glucose-Capillary: 231 mg/dL — ABNORMAL HIGH (ref 70–99)

## 2021-11-27 LAB — MAGNESIUM: Magnesium: 2 mg/dL (ref 1.7–2.4)

## 2021-11-27 MED ORDER — INSULIN ASPART 100 UNIT/ML IJ SOLN
0.0000 [IU] | INTRAMUSCULAR | Status: DC
  Administered 2021-11-27: 4 [IU] via SUBCUTANEOUS

## 2021-11-27 MED ORDER — GLYCOPYRROLATE 0.2 MG/ML IJ SOLN: 0.2000 mg | INTRAMUSCULAR | Status: DC | PRN

## 2021-11-27 MED ORDER — FENTANYL BOLUS VIA INFUSION: 100.0000 ug | INTRAVENOUS | Status: DC | PRN

## 2021-11-27 MED ORDER — METOPROLOL TARTRATE 25 MG/10 ML ORAL SUSPENSION
37.5000 mg | Freq: Two times a day (BID) | ORAL | Status: DC
Start: 2021-11-27 — End: 2021-11-27
  Filled 2021-11-27 (×2): qty 15

## 2021-11-27 MED ORDER — POLYVINYL ALCOHOL 1.4 % OP SOLN: 1.0000 [drp] | Freq: Four times a day (QID) | OPHTHALMIC | Status: DC | PRN

## 2021-11-27 MED ORDER — ACETAMINOPHEN 325 MG PO TABS: 650.0000 mg | ORAL_TABLET | Freq: Four times a day (QID) | ORAL | Status: DC | PRN

## 2021-11-27 MED ORDER — MIDAZOLAM HCL 2 MG/2ML IJ SOLN: 1.0000 mg | INTRAMUSCULAR | Status: DC | PRN

## 2021-11-27 MED ORDER — INSULIN ASPART 100 UNIT/ML IJ SOLN: 0.0000 [IU] | Freq: Three times a day (TID) | INTRAMUSCULAR | Status: DC

## 2021-11-27 MED ORDER — ACETAMINOPHEN 650 MG RE SUPP: 650.0000 mg | Freq: Four times a day (QID) | RECTAL | Status: DC | PRN

## 2021-11-27 MED ORDER — DIPHENHYDRAMINE HCL 50 MG/ML IJ SOLN: 25.0000 mg | INTRAMUSCULAR | Status: DC | PRN

## 2021-11-27 MED ORDER — MIDAZOLAM BOLUS VIA INFUSION (WITHDRAWAL LIFE SUSTAINING TX): 2.0000 mg | INTRAVENOUS | Status: DC | PRN

## 2021-11-27 MED ORDER — INSULIN ASPART 100 UNIT/ML IJ SOLN
0.0000 [IU] | INTRAMUSCULAR | Status: DC
  Administered 2021-11-27 (×2): 3 [IU] via SUBCUTANEOUS

## 2021-11-27 MED ORDER — INSULIN ASPART 100 UNIT/ML IJ SOLN: 0.0000 [IU] | INTRAMUSCULAR | Status: DC

## 2021-11-27 MED ORDER — ONDANSETRON HCL 4 MG/2ML IJ SOLN: 4.0000 mg | Freq: Four times a day (QID) | INTRAMUSCULAR | Status: DC | PRN

## 2021-11-27 MED ORDER — SODIUM CHLORIDE 0.9 % IV SOLN: INTRAVENOUS | Status: DC

## 2021-11-27 MED ORDER — IPRATROPIUM-ALBUTEROL 0.5-2.5 (3) MG/3ML IN SOLN
3.0000 mL | Freq: Four times a day (QID) | RESPIRATORY_TRACT | Status: DC
Start: 2021-11-27 — End: 2021-11-27

## 2021-11-27 MED ORDER — ONDANSETRON 4 MG PO TBDP: 4.0000 mg | ORAL_TABLET | Freq: Four times a day (QID) | ORAL | Status: DC | PRN

## 2021-11-27 MED ORDER — ALBUTEROL SULFATE (2.5 MG/3ML) 0.083% IN NEBU: 2.5000 mg | INHALATION_SOLUTION | RESPIRATORY_TRACT | Status: DC | PRN

## 2021-11-27 MED ORDER — SODIUM ZIRCONIUM CYCLOSILICATE 10 G PO PACK
10.0000 g | PACK | Freq: Once | ORAL | Status: AC
  Administered 2021-11-27: 10 g
  Filled 2021-11-27: qty 1

## 2021-11-27 MED ORDER — MIDAZOLAM-SODIUM CHLORIDE 100-0.9 MG/100ML-% IV SOLN
0.0000 mg/h | INTRAVENOUS | Status: DC
  Administered 2021-11-27: 4 mg/h via INTRAVENOUS
  Filled 2021-11-27: qty 100

## 2021-11-27 MED ORDER — FENTANYL 2500MCG IN NS 250ML (10MCG/ML) PREMIX INFUSION: 0.0000 ug/h | INTRAVENOUS | Status: DC

## 2021-11-27 MED ORDER — GLYCOPYRROLATE 1 MG PO TABS: 1.0000 mg | ORAL_TABLET | ORAL | Status: DC | PRN

## 2021-11-28 LAB — CULTURE, RESPIRATORY W GRAM STAIN: Culture: NO GROWTH

## 2021-11-28 LAB — HEPARIN INDUCED PLATELET AB (HIT ANTIBODY): Heparin Induced Plt Ab: 0.148 OD (ref 0.000–0.400)

## 2021-11-28 LAB — METHYLMALONIC ACID, SERUM: Methylmalonic Acid, Quantitative: 445 nmol/L — ABNORMAL HIGH (ref 0–378)

## 2021-11-30 LAB — SEROTONIN RELEASE ASSAY (SRA)
SRA .2 IU/mL UFH Ser-aCnc: 4 % (ref 0–20)
SRA 100IU/mL UFH Ser-aCnc: 2 % (ref 0–20)

## 2021-11-30 NOTE — Procedures (Signed)
Extubation Procedure Note  Patient Details:   Name: Mark Thomas DOB: [birth year] MRN: [id number]   Airway Documentation:    Vent end date: 2021-11-29 Vent end time: 5176   Pt extubated per Withdrawal of Life Protocol   Jesse Sans 11-29-2021, 11:56 AM

## 2021-11-30 NOTE — Progress Notes (Addendum)
NAME:  Mark Thomas, MRN:  [id number], DOB:  1951-02-17, LOS: 16 ADMISSION DATE:  11/13/2021, CONSULTATION DATE:  11/20/21  REFERRING MD:  Waldron Labs, TRH, CHIEF COMPLAINT: Recurrent pneumonia  History of Present Illness:  71 year old never smoker that we are asked to evaluate for recurrent pneumonia. He was initially seen as a pulmonary consult 09/15/2018 for ILD and recurrent pneumonia.  He subsequently developed left hemi- opacity of his lung and was hospitalized multiple times.  He was treated with prolonged and multiple courses of IV antibiotics.  He required oxygen and was subsequently weaned off.  He is now a resident of [facility] for the last 6 weeks.  Bronchoscopy on 6/7 with BAL was negative for AFB, fungal and bacterial cultures. Swallowing evaluation 07/2021 showed mild aspiration risk and mild dysphagia  Was last hospitalization for 6/20 to 6/27 for recurrent pneumonia with a component of acute diastolic heart failure.  He was diuresed, treated with cefepime and steroids and discharged on 3 L of oxygen. He developed shortness of breath, right-sided chest pain and hypoxia for the last 4 days prior to this admission.  He was started on ceftriaxone and doxycycline.  He was also on Lasix and prednisone .  Chest x-ray 7/13 showed right more than left infiltrate. He was seen by our nurse practitioner on 7/14 for an office visit.  Chest x-ray 7/14 showed asymmetric airspace disease in the right mid and lower lung  He developed subjective fevers and cough and sought ED attention on 7/17 Labs showed leukocytosis 20 K increased from 12, creatinine 1.5, BNP 299, lactate was 2.9 and 2.5. Chest x-ray showed worsening infiltrates on the right in a patchy pattern consistent with bronchopneumonia.  He was started on cefepime and vancomycin and PCCM consulted.  PCCM following peripherally. Called again in the early a.m. of 7/22 after patient became acutely hypoxemic.  Patient awoke and panic with  increased oxygen requirement from 4 L to 15 L, nonrebreather placed.  Transitioned to BiPAP. ABG 7.41/47/137/30.4  Pertinent  Medical History  Atrial fibrillation status post Watchman device CAD status post stent, on Plavix Sick sinus syndrome status post pacemaker RLS, essential tremor HFpEF Chronic respiratory failure with hypoxia CKD stage III  Significant Hospital Events: Including procedures, antibiotic start and stop dates in addition to other pertinent events   CT chest without con 06/2021 patchy groundglass nodules in right upper lobe CTA chest 07/2021 mild peripheral interstitial opacities in lower lung?  ILD, patchy groundglass opacities in bilateral upper lobes. CT chest without con 10/2021 left lung opacities have improved, worsening patchy groundglass opacity throughout the right lung, trace effusions , distal esophageal wall thickening with moderate hiatal hernia. Swallow evaluation/MBS 07/2021 mild aspiration risk, no restriction 7/19 Modified barium swallow. Normal swallowing function. Barium was retained in esophagus and did not pas GE junction. Cefepime>Unasyn 7/22 PCCM called for hypoxia, patient started on BiPAP    Significant studies: 7/17>> CXR: Worsening patchy right-sided opacities 7/17>> CT chest: Worsening patchy opacities in the right lung-overall improvement in left lung opacities. 7/20>> barium esophagogram: Marked narrowed appearance of distal esophagus   Significant microbiology data: 6/07>> BAL culture: Negative  6/07>> BAL AFB smear: Negative 6/07>> BAL AFB culture: Pending 6/07>> BAL fungal culture: Negative 6/07>> BAL cytology: No malignant cells identified 7/17>> COVID PCR: Negative 7/17>> blood culture: Negative   Procedures: 6/07>> bronchoscopy   Interim History / Subjective:  Febrile to 100.4. Intubated and sedated.  Objective: febrile overnight at 100.4, MAP>65.   Blood pressure 138/62,  pulse (!) 106, temperature (!) 100.4 F (38 C),  temperature source Axillary, resp. rate 17, height 6' 1" (1.854 m), weight 108.7 kg, SpO2 94 %.    Vent Mode: PRVC FiO2 (%):  [50 %] 50 % Set Rate:  [14 bmp] 14 bmp Vt Set:  [480 mL] 480 mL PEEP:  [8 cmH20] 8 cmH20 Plateau Pressure:  [26 cmH20-30 cmH20] 27 cmH20   Intake/Output Summary (Last 24 hours) at 2021-12-16 0719 Last data filed at 2021-12-16 0400 Gross per 24 hour  Intake 1508.28 ml  Output 900 ml  Net 608.28 ml    Filed Weights   11/25/21 0500 11/26/21 0457 December 16, 2021 0500  Weight: 107.7 kg 105.1 kg 108.7 kg   Intake: 1.643 L Urine: 900 cc Output: 900 cc  Net: 743.1  1 stool occurence Examination:  General: elderly male, intubated and sedated.  HEENT: NCAT, intubated and sedated Neuro: sedated, no response to voice with eye opening to sternal rub.  CV: regular rate, irregular rhythm, systolic murmur present, warm extremities PULM: rhonchi present diffusely  GI: normal bowel sounds, soft abdomen Extremities: LUE more fuller and tighter then right extremity but improved  Skin: bruises and petechiae present on bilateral upper extremities.   Labs: Labs show WBC at 16.9 k, Hgb at 8.0, MCV 105.6. Plt 80. BMP shows Na 145, K at 5.8, Cr 1.25, and Ca at 8.3. Mag at 2.0. CBG was 197 this am and >200 since yesterday.  Resolved Hospital Problem list    Assessment & Plan:  Acute on chronic respiratory failure with hypoxia Recurrent pneumonia.   Patient is s/p Unasyn for 7 days. Worsening leukocytosis and febrile overnight. Tracheal aspirate performed and showed abundant WBC but no organism. Culture pending growth.  -Continue vent support with volume and pressure protection given suspicion of ARDS. Patient on pressure support this am. Will hold abx and monitor fevers and WBC. Given overall poor patient status, family thinking about starting comfort measures. -Continuous fentanyl and propofol -Continue tube feeds via cortrak -CTM   Left Upper Extremity  DVT Thrombocytopenia DVT present in left axillary and left brachial veins. Left basilic vein has superficial vein thrombosis.  -Continue fondaparinux due to HIT concern. HIT panel pending.  Hypernatremia Na 145 this am. Free water deficit calculated at 1.7 L. Initially sodium was 150. -Continue free water flushes 200 cc q4hrs -CTM sodium daily  AKI  Hyperkalemia (resolved) AKI improving. Cr 1.25. Baseline renal fxn normal. Appears to be in setting of lasix use and improving as it was held yesterday. K at 5.8 appears to be in setting of AKI. Lokelma given yesterday with response so will give again. Repeat BMP at 2 pm. -CTM   Anemia  Hgb at 7.9. Has multiple illness recently which are playing a role. MCV 100.7. Given gradual decline, could have iron deficiency playing a role with concomitant GH82 or folic acid deficiency.  -Monitor hgb, and transfuse as needed with goal of 7. MMA pending  Chronic HFpEF History of SVT History of PAF status post watchman's device (03/22/2019) History of CAD status post multiple PCI's History of ASD (status post closure) Moderate AS Chronic. On metoprolol 37.5 mg p.o. twice daily. Not on AC due to Watchman device placement, On plavix.  -Increase metoprolol to 37.5 mg BID.  -Goal K above 4, goal MG above 2  Essential Tremor Chronic for pt. Weaning off primidone due to initial plan to start eliquis. Pt metoprolol increased today.  -CTM  History of vascular dementia History of CVA -  Continue Namenda and Seroquel.  -Continue plavix and statin  Constipation Last bowel movement 07/28. Started tube feeds 07/25.  -Continue bowel regimen with daily Miralax and senna.   Hyperglycemia CBG elevated >180. Goal is 140-180. Was on tube feed SSI coverage and sensitive SSI added to yesterday. Changed to resistant scale today. Will continue to monitor today. -CTM  GERD -Continue PPI daily  Best Practice (right click and "Reselect all SmartList Selections"  daily)  Diet/type: tubefeeds DVT prophylaxis: Fondaparinux   GI prophylaxis: PPI Lines: N/A Continuous:  Tube feeds 65 cc/hr, fentanyl 100 mcg/hr, propofol 20 mcg Foley:  N/A Code Status:  full code Last date of multidisciplinary goals of care discussion [11/25/21]  Idamae Schuller, MD Tillie Rung. [facility] Internal Medicine Residency, PGY-2  Please see [email or web address] for pager details If no response, please call [phone] After hours, please call Elink at [phone]

## 2021-11-30 NOTE — Progress Notes (Signed)
Family meeting with MD to address end of life details. Comfort support and education provided. Established new parameters and began to prepare patient for terminal extubation to comfort care. Family remained at bedside and Emotional support ongoing. Pt time of death 1233 per two RNs per protocol. MD called to bedside. Elink report to [facility]. Post check list completed and family provided with patient placement info. Post morgue care per protocol and transported to morgue.

## 2021-11-30 NOTE — IPAL (Signed)
  Interdisciplinary Goals of Care Family Meeting   Date carried out: 2021/12/12  Location of the meeting: Conference room  Member's involved: Physician, Bedside Registered Nurse, and Family Member or next of kin  Durable Power of Attorney or acting medical decision maker: Wife Mark Thomas    Discussion: We discussed goals of care for [name] .  They understand he will not survive this illness and have asked that we move to full comfort measures.  Code status: Full DNR  Disposition: In-patient comfort care  Time spent for the meeting: 15 minutes    Mark Awkward, MD  12/12/2021, 10:57 AM

## 2021-11-30 NOTE — Progress Notes (Signed)
Attending:    Subjective: Fever overnight Had a bowel movement Net positive yesterday Renal function improved K about the same WBC up Hyperglycemia again today  Objective: Vitals:   12-22-21 0500 December 22, 2021 0700 2021/12/22 0724 2021-12-22 0800  BP: 138/62  115/77 138/69  Pulse: (!) 106  (!) 114 99  Resp: _0 Temp:  98.4 F (36.9 C)    TempSrc:  Axillary    SpO2: 94%  94% 95%  Weight: 108.7 kg     Height:       Vent Mode: PRVC FiO2 (%):  [50 %] 50 % Set Rate:  [14 bmp] 14 bmp Vt Set:  [480 mL] 480 mL PEEP:  [8 cmH20] 8 cmH20 Plateau Pressure:  [27 cmH20-30 cmH20] 27 cmH20  Intake/Output Summary (Last 24 hours) at 12-22-2021 0931 Last data filed at 12-22-2021 0400 Gross per 24 hour  Intake 1369.1 ml  Output 900 ml  Net 469.1 ml    General:  In bed on vent HENT: NCAT ETT in place PULM: rhonchi B, vent supported breathing CV: RRR, no mgr GI: BS+, soft, nontender MSK: normal bulk and tone Neuro: sedated on vent    CBC    Component Value Date/Time   WBC 16.9 (H) 2021-12-22 0032   RBC 2.66 (L) 22-Dec-2021 0032   HGB 8.0 (L) 12-22-21 0032   HCT 28.1 (L) 12-22-2021 0032   PLT 80 (L) 22-Dec-2021 0032   MCV 105.6 (H) 12/22/21 0032   MCH 30.1 22-Dec-2021 0032   MCHC 28.5 (L) 22-Dec-2021 0032   RDW 16.9 (H) 2021-12-22 0032   LYMPHSABS 1.3 11/29/2021 0052   MONOABS 0.5 11/09/2021 0052   EOSABS 0.2 11/17/2021 0052   BASOSABS 0.0 11/26/2021 0052    BMET    Component Value Date/Time   NA 145 12-22-2021 0032   NA 137 03/29/2021 1158   K 5.8 (H) 2021/12/22 0032   CL 104 2021-12-22 0032   CO2 35 (H) 12/22/21 0032   GLUCOSE 238 (H) 12-22-21 0032   BUN 40 (H) 12-22-2021 0032   BUN 23 03/29/2021 1158   CREATININE 1.25 (H) 2021-12-22 0032   CREATININE 0.99 09/28/2021 0000   CALCIUM 8.3 (L) Dec 22, 2021 0032   GFRNONAA >60 2021-12-22 0032    CXR images none today  Resp culture from 7/28 > WBC noted  Impression/Plan: ARDS from aspiraiton pneumonia>  poor prognosis, family wishes to move forward with comfort measures Aspiration pneumonia > hold antibiotics at this point DVT, 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

## 2021-11-30 NOTE — Death Summary Note (Signed)
Death Summary    Patient details  Name: Mark Thomas  HTX:[id number]  DOB:10-02-1950  Admission Date:20-Nov-2021  Date of Death: 2021-12-02  Date of death: 12/02/2021 Time of Death: 06-18-22: 36 pm  Profile data   Living situation: Long term lives home but was at SNF due to repeated hospitalizations Independent with ADLs:  yes Bed bound or WC bound : no Code status  Advanced directives:DNR established during hospitalization  Reason for admission   Recurrent Aspiration Pneumonia Preliminary Cause of death   Acute on chronic hypoxic respiratory failure 2/2 to recurrent aspiration pneumonia c/b ARDS Secondary diagnoses, contributing co-morbidities & complicating factors   Patient Active Problem List   Diagnosis Date Noted   Malnutrition of moderate degree 11/07/2021   HCAP (healthcare-associated pneumonia) 32/06/3341   Diastolic CHF ([place]) 56/86/1683   Chronic respiratory failure with hypoxia ([place]) 11/12/2021   Recurrent pneumonia 10/19/2021   Hypoalbuminemia 10/19/2021   Pleural effusion 10/19/2021   Leukocytosis 10/19/2021   Supraventricular tachycardia (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  08/26/2021   Acute respiratory failure with hypoxia ([place]) 08/26/2021   Dyspnea 08/26/2021   Lobar pneumonia, unspecified organism ([place]) 08/05/2021   Acute renal failure superimposed on stage 3a chronic kidney disease ([place]) 08/04/2021   Hypotension 08/04/2021   Kidney cysts 08/04/2021   Coughing 07/28/2021   Seborrheic keratosis 07/28/2021   Peripheral venous insufficiency 07/07/2021   Psoriasis 07/07/2021   Sensorineural hearing loss, unilateral, left ear, with unrestricted hearing on the contralateral side 07/07/2021   Community acquired pneumonia 07/07/2021   Other specified  problems related to psychosocial circumstances 03/30/2021   Tinea 03/30/2021   Xerosis cutis 03/30/2021   Myocardial infarct ([place]) 02/02/2021   Weakness 01/09/2021   Aortic stenosis 01/08/2021   Vascular dementia with increased confusion/anger 01/08/2021   TIA (transient ischemic attack) 01/07/2021   Rotator cuff arthropathy of right shoulder 12/30/2020   Coronary arteriosclerosis 12/24/2020   Chronic alcoholism in remission ([place]) 12/24/2020   Disorder of bursae and tendons in shoulder region 12/24/2020   Gastric ulcer with hemorrhage 12/24/2020   Gout 12/24/2020   Major depressive disorder, single episode, severe, 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[place]) 12/24/2020   Mitral valve prolapse 12/24/2020   Other and unspecified mycoses 12/24/2020   Chronic gingivitis, non-plaque induced 12/24/2020   Posttraumatic stress disorder 12/24/2020   Paroxysmal atrial fibrillation (HCC) s/p Watchman device 12/24/2020   Essential tremor 12/24/2020   GAD (generalized anxiety disorder) 12/24/2020   Anemia 09/30/2020   Restless legs syndrome (RLS) 09/30/2020   Elevated liver enzymes 05/26/2020   Orthostatic hypotension 10/01/2018   Depression 06/01/2018   Gastroesophageal reflux disease without esophagitis 12/13/2017   Insomnia 09/26/2017   CKD (chronic kidney disease) stage 3, GFR 30-59 ml/min (HCC) 09/14/2017   Pacemaker 02/17/2017   History of cardiac radiofrequency ablation (RFA) 12/18/2016   Sinus bradycardia 12/18/2016   Erectile dysfunction 02/12/2013   Other and unspecified hyperlipidemia 02/12/2013   Presence of stent in LAD coronary artery 02/12/2013    Condition on arrival   Mark Thomas is a 71 y.o. male admitted from nursing facility in poor condition due to recurrent aspiration pneumonia with multiple repeat hospitalizations. Hospital course   71 year old never smoker that we are asked to evaluate for recurrent pneumonia. He subsequently developed left hemi- opacity  of his lung and was  hospitalized multiple times.  He was treated with prolonged and multiple courses of IV antibiotics.  He required oxygen and was subsequently weaned off.  He is now a resident of [facility] for the last 6 weeks.  Bronchoscopy on 6/7 with BAL was negative for AFB, fungal and bacterial cultures. Swallowing evaluation 07/2021 showed mild aspiration risk and mild dysphagia   Was last hospitalization for 6/20 to 6/27 for recurrent pneumonia with a component of acute diastolic heart failure.  He was diuresed, treated with cefepime and steroids and discharged on 3 L of oxygen. He developed shortness of breath, right-sided chest pain and hypoxia for the last 4 days prior to this admission.  He was started on ceftriaxone and doxycycline.  He was also on Lasix and prednisone .  Chest x-ray 7/13 showed right more than left infiltrate. He was seen by our nurse practitioner on 7/14 for an office visit.  Chest x-ray 7/14 showed asymmetric airspace disease in the right mid and lower lung   He developed subjective fevers and cough and sought ED attention on 7/17 Labs showed leukocytosis 20 K increased from 12, creatinine 1.5, BNP 299, lactate was 2.9 and 2.5. Chest x-ray showed worsening infiltrates on the right in a patchy pattern consistent with bronchopneumonia.  He was started on cefepime and vancomycin and PCCM consulted.   PCCM followed peripherally and was called again in the early a.m. of 7/22 after patient became acutely hypoxemic.  Patient awoke and panic with increased oxygen requirement from 4 L to 15 L, nonrebreather placed.  Transitioned to BiPAP but not able to tolerate BiPAP. He was intubated and transferred to the ICU. GI saw pt while in the ICU and dilatated the GE junction and recommended further testing as this along with pts life style was leading him to have recurrent pneumonias. There  was an initial suspicion of ARDS given repeat infections and he completed course of Unasyn and was given IV lasix but  his CXR did not improve. Patient had DVT in the right upper extremity and heparin was started for that. Plan was to change his AC to oral but pts respiratory status inhibited the plan. Patient's family decided to pursue comfort care given patient's poor health status and lack of improvement. Pt placed on comfort care on Dec 19, 2021 and passed away the same day.    Idamae Schuller, MD   [facility]. [facility] Residency, PGY-2

## 2021-11-30 DEATH — deceased

## 2021-12-02 ENCOUNTER — Telehealth: Payer: Self-pay | Admitting: Neurology

## 2021-12-02 NOTE — Telephone Encounter (Signed)
Pt's wife notified [facility] patient has passed away.

## 2021-12-03 ENCOUNTER — Ambulatory Visit: Payer: Medicare Other | Admitting: Pulmonary Disease

## 2021-12-03 ENCOUNTER — Telehealth (HOSPITAL_COMMUNITY): Payer: Self-pay | Admitting: *Deleted

## 2021-12-03 NOTE — Telephone Encounter (Signed)
Dr De Nurse Patient's Wife Called to Inform that her Husband ( the Patient ) has passed on December 17, 2021 & wanted to make sure any appointments be canceled.

## 2021-12-14 ENCOUNTER — Ambulatory Visit: Payer: Medicare Other | Admitting: Neurology

## 2021-12-20 NOTE — Progress Notes (Signed)
Cardiology Office Note:    Date:  12/20/2021   ID:  Mark Thomas, DOB [birth year], MRN [id number]  PCP:  Luetta Nutting, DO  Cardiologist:  Jenne Campus, MD    Referring MD: Luetta Nutting, DO   Chief Complaint  Patient presents with   BP Fluctuation     Some dizziness when he stands     History of Present Illness:    Mark Thomas is a 71 y.o. male  with a very complex past medical history.  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  Also history of hypertension which seems to be somewhat difficult to control, sick sinus syndrome with required permanent pacemaker history of alcohol abuse but now in remission for last 3 years aortic stenosis which was assessed in January 2022 as moderate with mean gradient of 20 dimensionless index 0.37 repeated echocardiogram done in September this year in [place] showed mild aortic stenosis. He would like to be established as a patient my practice.  He is moving to this area and overall seems to be doing well the biggest problem is balance issue.  Apparently he is going to physical therapy to improve it.  Also have a chronic problem with the right shoulder and surgery is contemplated.  Recently in December he end up suffering from COVID-19 infection since that time 3 times he ended up having treatment with antibiotic for pneumonia.  Recently he ended up in the hospital because of orthostatic hypotension and falls and pneumonia treated aggressively with antibiotic as well as steroids and he is back in my office Comes today to my office for follow-up.  He still have some what appears to be orthostatic  hypotension he is getting dizzy when he is getting up.  Denies having frequent falls because of this.  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   Essential tremor    Gait disorder 11/2020   GERD (gastroesophageal reflux disease)    High cholesterol    Hypertension    Myocardial infarct (HCC)    Orthostatic hypotension    SSS (sick sinus syndrome) ([place])    dual chamber pacemaker placed, date unknown   Stroke ([place]) 02/02/2021   multiple   Vascular dementia [facility])     Past Surgical History:  Procedure Laterality Date   APPENDECTOMY     ASD REPAIR  01/2020   BALLOON DILATION N/A 11/10/2021   Procedure: BALLOON DILATION;  Surgeon: Jackquline Denmark, MD;  Location: [place];  Service: Gastroenterology;  Laterality: N/A;   BIOPSY  11/01/2021   Procedure: BIOPSY;  Surgeon: Jackquline Denmark, MD;  Location: [facility] ENDOSCOPY;  Service: Gastroenterology;;   CARDIAC SURGERY     CHOLECYSTECTOMY, LAPAROSCOPIC  01/2019   with ventral hernia repair, surgical findings of liver between GB and liver.  At [place]  08/2015   At [facility]  small polyps in ascending colon.  Nonbleeding internal hemorrhoids   ESOPHAGOGASTRODUODENOSCOPY  09/2001   In [facility].  Vernie Shanks MD. healed esophageal ulcers.  Healing gastric ulcer.  Gastric polyp removed   ESOPHAGOGASTRODUODENOSCOPY  08/2015   no report, just path from "gastritis": Benign gastric mucosa with superficial vascular congestion   ESOPHAGOGASTRODUODENOSCOPY N/A 11/22/2021   Procedure: ESOPHAGOGASTRODUODENOSCOPY (EGD);  Surgeon: Jackquline Denmark, MD;  Location: [facility] ENDOSCOPY;  Service: Gastroenterology;  Laterality: N/A;  Bedside Case in 74M.  Patient intubated.  Bring  balloon dilators   FLEXIBLE BRONCHOSCOPY N/A 10/06/2021   Procedure: FLEXIBLE BRONCHOSCOPY;  Surgeon: Margaretha Seeds, MD;  Location: [place];  Service: Cardiopulmonary;  Laterality: N/A;   HERNIA REPAIR  01/2019   ventral hernia repair w cholecystectomy.  at [place]  03/2019   watchman procedure 03/2019   NASAL SINUS SURGERY     SHOULDER SURGERY  2013   R Rot cuff repair. around 2013.    Current Medications: Current Meds  Medication Sig   albuterol (PROVENTIL) (2.5 MG/3ML) 0.083% nebulizer solution Take 3 mLs (2.5 mg total) by nebulization every 6 (six) hours as needed for wheezing or shortness of breath. (Patient taking differently: Take 2.5 mg by nebulization every 4 (four) hours as needed for shortness of breath.)   albuterol (VENTOLIN HFA) 108 (90 Base) MCG/ACT inhaler Inhale 2 puffs into the lungs every 6 (six) hours as needed for wheezing or shortness of breath.   allopurinol (ZYLOPRIM) 100 MG tablet Take 100 mg by mouth daily.   aspirin 81 MG EC tablet Take 81 mg by mouth daily.   atorvastatin (LIPITOR) 80 MG tablet Take 80 mg by mouth daily.   b complex vitamins capsule Take 1 capsule by mouth daily.   Cholecalciferol (VITAMIN D3) 10 MCG (400 UNIT) CAPS Take 400 Units by mouth in the morning.   clopidogrel (PLAVIX) 75 MG tablet Take 75 mg by mouth daily.   ferrous sulfate 325 (65 FE) MG EC tablet Take 1 tablet (325 mg total) by mouth in the morning and at bedtime. (Patient taking differently: Take 325 mg by mouth every other day.)   latanoprost (XALATAN) 0.005 % ophthalmic solution Place 1 drop into both eyes at bedtime.   magnesium oxide (MAG-OX) 400 MG tablet Take 400 mg by mouth daily.   nitroGLYCERIN (NITROLINGUAL) 0.4 MG/SPRAY spray Place 1 spray under the tongue every 5 (five) minutes x 3 doses as needed for chest pain.   pantoprazole (PROTONIX) 40 MG tablet Take 1 tablet (40 mg total) by mouth every evening.   primidone (MYSOLINE) 50 MG tablet  Take 2 tablets (100 mg total) by mouth 2 (two) times daily.   ranolazine (RANEXA) 1000 MG SR tablet Take 1,000 mg by mouth in the morning and at bedtime.   triamcinolone ointment (KENALOG) 0.1 % Apply 1 application. topically daily as needed (to affected areas- for psoriasis flares).   [DISCONTINUED] chlorpheniramine-HYDROcodone (TUSSIONEX PENNKINETIC ER) 10-8 MG/5ML Take 5 mLs by mouth every 12 (twelve) hours as needed for cough. (Patient not taking: Reported on 09/28/2021)   [DISCONTINUED] escitalopram (LEXAPRO) 20 MG tablet Take 1.5 tablets (30 mg total) by mouth daily.   [DISCONTINUED] FLUDROCORTISONE ACETATE PO Take 0.1 mg by mouth in the morning and at bedtime.   [DISCONTINUED] memantine (NAMENDA) 10 MG tablet Take 1 tablet (10 mg total) by mouth 2 (two) times daily. (Patient taking differently: Take 10 mg by mouth in the morning and at bedtime.)   [  DISCONTINUED] midodrine (PROAMATINE) 5 MG tablet Take 1 tablet (5 mg total) by mouth 3 (three) times daily with meals.   [DISCONTINUED] mometasone-formoterol (DULERA) 100-5 MCG/ACT AERO Inhale 2 puffs into the lungs 2 (two) times daily.   [DISCONTINUED] potassium citrate (UROCIT-K) 10 MEQ (1080 MG) SR tablet Take 10 mEq by mouth daily.   [DISCONTINUED] QUEtiapine (SEROQUEL) 50 MG tablet Take 2 tablets (100 mg total) by mouth at bedtime.   [DISCONTINUED] rOPINIRole (REQUIP XL) 4 MG 24 hr tablet Take 1 tablet (4 mg total) by mouth at bedtime.     Allergies:   Dilaudid [hydromorphone], Gabapentin, Bactrim [sulfamethoxazole-trimethoprim], Benadryl [diphenhydramine], Phenobarbital, Heparin, and Penicillins   Social History   Socioeconomic History   Marital status: Married    Spouse name: Dorian Duval   Number of children: 2   Years of education: 20   Highest education level: Master's degree (e.g., MA, MS, MEng, MEd, MSW, MBA)  Occupational History   Occupation: Retired  Tobacco Use   Smoking status: Never    Passive exposure: Never    Smokeless tobacco: Never  Vaping Use   Vaping Use: Never used  Substance and Sexual Activity   Alcohol use: Not Currently   Drug use: Never   Sexual activity: Not on file  Other Topics Concern   Not on file  Social History Narrative   Lives with his wife. He has vascular dementia. His wife is his primary caretaker.He enjoys watching football and spending time with his grandchildren.   Social Determinants of Health   Financial Resource Strain: Low Risk  (03/15/2021)   Overall Financial Resource Strain (CARDIA)    Difficulty of Paying Living Expenses: Not hard at all  Food Insecurity: No Food Insecurity (03/15/2021)   Hunger Vital Sign    Worried About Running Out of Food in the Last Year: Never true    Ran Out of Food in the Last Year: Never true  Transportation Needs: No Transportation Needs (03/15/2021)   PRAPARE - Hydrologist (Medical): No    Lack of Transportation (Non-Medical): No  Physical Activity: Insufficiently Active (03/15/2021)   Exercise Vital Sign    Days of Exercise per Week: 2 days    Minutes of Exercise per Session: 50 min  Stress: No Stress Concern Present (03/15/2021)   [place]    Feeling of Stress : Not at all  Social Connections: [place] (03/15/2021)   Social Connection and Isolation Panel [NHANES]    Frequency of Communication with Friends and Family: More than three times a week    Frequency of Social Gatherings with Friends and Family: Once a week    Attends Religious Services: More than 4 times per year    Active Member of [employer] or Organizations: Yes    Attends Music therapist: More than 4 times per year    Marital Status: Married     Family History: The patient's family history includes Hypertension in his father and mother; Stroke in his father and mother. ROS:   Please see the history of present illness.    All 14 point  review of systems negative except as described per history of present illness  EKGs/Labs/Other Studies Reviewed:      Recent Labs: 03/18/2021: NT-Pro BNP 370 10/20/2021: TSH 3.377 11/20/2021: B Natriuretic Peptide 326.4 11/21/2021: ALT 29 12/11/21: BUN 40; Creatinine, Ser 1.25; Hemoglobin 8.0; Magnesium 2.0; Platelets 80; Potassium 5.8; Sodium 145  Recent Lipid  Panel    Component Value Date/Time   CHOL 131 01/07/2021 1804   TRIG 125 11/25/2021 0601   HDL 47 01/07/2021 1804   CHOLHDL 2.8 01/07/2021 1804   VLDL 30 01/07/2021 1804   LDLCALC 54 01/07/2021 1804    Physical Exam:    VS:  Ht _0  (1.854 m)   Wt 259 lb (117.5 kg)   SpO2 93%   BMI 34.17 kg/m     Wt Readings from Last 3 Encounters:  12-06-2021 239 lb 10.2 oz (108.7 kg)  11/12/21 232 lb (105.2 kg)  10/26/21 232 lb 11.2 oz (105.6 kg)     GEN:  Well nourished, well developed in no acute distress HEENT: Normal NECK: No JVD; No carotid bruits LYMPHATICS: No lymphadenopathy CARDIAC: RRR, no murmurs, no rubs, no gallops RESPIRATORY:  Clear to auscultation without rales, wheezing or rhonchi  ABDOMEN: Soft, non-tender, non-distended MUSCULOSKELETAL:  No edema; No deformity  SKIN: Warm and dry LOWER EXTREMITIES: no swelling NEUROLOGIC:  Alert and oriented x 3 PSYCHIATRIC:  Normal affect   ASSESSMENT:    1. Orthostatic hypotension   2. Coronary arteriosclerosis   3. Paroxysmal atrial fibrillation (HCC) s/p Watchman device   4. TIA (transient ischemic attack)    PLAN:    In order of problems listed above:  Orthostatic hypotension was still seems to be a problem.  I will put him on fludrocortisone, he will have Chem-7 done within next few days. Coronary disease: Seems to be stable from that point review. Paroxysmal atrial fibrillation, status post Watchman device, doing well. History of TIA stable no new issues   Medication Adjustments/Labs and Tests Ordered: Current medicines are reviewed at length with the  patient today.  Concerns regarding medicines are outlined above.  Orders Placed This Encounter  Procedures   Basic metabolic panel   Medication changes: No orders of the defined types were placed in this encounter.   Signed, Park Liter, MD, [place] 12/20/2021 11:56 AM    [place]

## 2021-12-22 ENCOUNTER — Ambulatory Visit: Payer: Medicare Other | Admitting: Cardiology

## 2021-12-27 ENCOUNTER — Ambulatory Visit: Payer: Medicare Other | Admitting: Pulmonary Disease

## 2021-12-28 ENCOUNTER — Telehealth (HOSPITAL_COMMUNITY): Payer: Medicare Other | Admitting: Psychiatry

## 2022-01-19 ENCOUNTER — Ambulatory Visit: Payer: Medicare Other | Admitting: Neurology
